# Patient Record
Sex: Male | Born: 1935 | Race: Black or African American | Hispanic: No | Smoking: Former smoker
Health system: Southern US, Community
[De-identification: ages and names within clinical notes are randomized; demographics above are authoritative.]

## PROBLEM LIST (undated history)

## (undated) DIAGNOSIS — Z8719 Personal history of other diseases of the digestive system: Secondary | ICD-10-CM

## (undated) DIAGNOSIS — H269 Unspecified cataract: Secondary | ICD-10-CM

## (undated) DIAGNOSIS — I10 Essential (primary) hypertension: Secondary | ICD-10-CM

## (undated) DIAGNOSIS — N184 Chronic kidney disease, stage 4 (severe): Secondary | ICD-10-CM

## (undated) DIAGNOSIS — I219 Acute myocardial infarction, unspecified: Secondary | ICD-10-CM

## (undated) DIAGNOSIS — M1990 Unspecified osteoarthritis, unspecified site: Secondary | ICD-10-CM

## (undated) DIAGNOSIS — D519 Vitamin B12 deficiency anemia, unspecified: Secondary | ICD-10-CM

## (undated) DIAGNOSIS — I119 Hypertensive heart disease without heart failure: Secondary | ICD-10-CM

## (undated) DIAGNOSIS — I739 Peripheral vascular disease, unspecified: Secondary | ICD-10-CM

## (undated) DIAGNOSIS — E785 Hyperlipidemia, unspecified: Secondary | ICD-10-CM

## (undated) DIAGNOSIS — I255 Ischemic cardiomyopathy: Secondary | ICD-10-CM

## (undated) DIAGNOSIS — K2940 Chronic atrophic gastritis without bleeding: Secondary | ICD-10-CM

## (undated) DIAGNOSIS — I2510 Atherosclerotic heart disease of native coronary artery without angina pectoris: Secondary | ICD-10-CM

## (undated) DIAGNOSIS — E139 Other specified diabetes mellitus without complications: Secondary | ICD-10-CM

## (undated) DIAGNOSIS — I5022 Chronic systolic (congestive) heart failure: Secondary | ICD-10-CM

## (undated) DIAGNOSIS — K449 Diaphragmatic hernia without obstruction or gangrene: Secondary | ICD-10-CM

## (undated) DIAGNOSIS — E119 Type 2 diabetes mellitus without complications: Secondary | ICD-10-CM

## (undated) DIAGNOSIS — K2270 Barrett's esophagus without dysplasia: Secondary | ICD-10-CM

## (undated) DIAGNOSIS — K31819 Angiodysplasia of stomach and duodenum without bleeding: Secondary | ICD-10-CM

## (undated) HISTORY — DX: Essential (primary) hypertension: I10

## (undated) HISTORY — DX: Atherosclerotic heart disease of native coronary artery without angina pectoris: I25.10

## (undated) HISTORY — DX: Acute myocardial infarction, unspecified: I21.9

## (undated) HISTORY — DX: Barrett's esophagus without dysplasia: K22.70

## (undated) HISTORY — DX: Hyperlipidemia, unspecified: E78.5

## (undated) HISTORY — DX: Peripheral vascular disease, unspecified: I73.9

## (undated) HISTORY — PX: CATARACT EXTRACTION, BILATERAL: SHX1313

## (undated) HISTORY — DX: Diaphragmatic hernia without obstruction or gangrene: K44.9

## (undated) HISTORY — DX: Chronic atrophic gastritis without bleeding: K29.40

## (undated) HISTORY — DX: Angiodysplasia of stomach and duodenum without bleeding: K31.819

## (undated) HISTORY — PX: COLONOSCOPY: SHX174

## (undated) HISTORY — PX: CARDIAC CATHETERIZATION: SHX172

## (undated) HISTORY — DX: Unspecified cataract: H26.9

---

## 1996-02-29 HISTORY — PX: CORONARY ANGIOPLASTY: SHX604

## 1997-09-29 ENCOUNTER — Encounter: Admission: RE | Admit: 1997-09-29 | Discharge: 1997-12-28 | Payer: Self-pay | Admitting: Internal Medicine

## 2001-02-28 HISTORY — PX: CORONARY ARTERY BYPASS GRAFT: SHX141

## 2001-07-12 ENCOUNTER — Encounter: Payer: Self-pay | Admitting: Interventional Cardiology

## 2001-07-13 ENCOUNTER — Inpatient Hospital Stay (HOSPITAL_COMMUNITY): Admission: RE | Admit: 2001-07-13 | Discharge: 2001-07-20 | Payer: Self-pay | Admitting: Interventional Cardiology

## 2001-07-16 ENCOUNTER — Encounter: Payer: Self-pay | Admitting: Surgery

## 2001-07-17 ENCOUNTER — Encounter: Payer: Self-pay | Admitting: Surgery

## 2001-07-18 ENCOUNTER — Encounter: Payer: Self-pay | Admitting: Surgery

## 2003-10-20 ENCOUNTER — Emergency Department (HOSPITAL_COMMUNITY): Admission: EM | Admit: 2003-10-20 | Discharge: 2003-10-20 | Payer: Self-pay | Admitting: Emergency Medicine

## 2004-03-12 ENCOUNTER — Ambulatory Visit: Payer: Self-pay | Admitting: Internal Medicine

## 2004-03-26 ENCOUNTER — Ambulatory Visit: Payer: Self-pay | Admitting: Internal Medicine

## 2004-06-10 ENCOUNTER — Ambulatory Visit: Payer: Self-pay | Admitting: Internal Medicine

## 2004-09-17 ENCOUNTER — Ambulatory Visit: Payer: Self-pay | Admitting: Internal Medicine

## 2004-12-17 ENCOUNTER — Ambulatory Visit: Payer: Self-pay | Admitting: Internal Medicine

## 2005-03-18 ENCOUNTER — Ambulatory Visit: Payer: Self-pay | Admitting: Internal Medicine

## 2005-06-10 ENCOUNTER — Ambulatory Visit: Payer: Self-pay | Admitting: Internal Medicine

## 2005-09-19 ENCOUNTER — Ambulatory Visit: Payer: Self-pay | Admitting: Internal Medicine

## 2005-12-06 ENCOUNTER — Ambulatory Visit: Payer: Self-pay | Admitting: Internal Medicine

## 2005-12-30 ENCOUNTER — Ambulatory Visit: Payer: Self-pay | Admitting: Internal Medicine

## 2005-12-30 LAB — CONVERTED CEMR LAB
ALT: 23 units/L (ref 0–40)
AST: 22 units/L (ref 0–37)
Albumin: 4.4 g/dL (ref 3.5–5.2)
Alkaline Phosphatase: 31 units/L — ABNORMAL LOW (ref 39–117)
BUN: 23 mg/dL (ref 6–23)
Bilirubin, Direct: 0.1 mg/dL (ref 0.0–0.3)
CO2: 30 meq/L (ref 19–32)
Calcium: 10.2 mg/dL (ref 8.4–10.5)
Chloride: 105 meq/L (ref 96–112)
Cholesterol: 162 mg/dL (ref 0–200)
Creatinine, Ser: 1.9 mg/dL — ABNORMAL HIGH (ref 0.4–1.5)
GFR calc non Af Amer: 37 mL/min
Glomerular Filtration Rate, Af Am: 45 mL/min/{1.73_m2}
Glucose, Bld: 88 mg/dL (ref 70–99)
Hgb A1c MFr Bld: 7.2 % — ABNORMAL HIGH (ref 4.6–6.0)
PSA: 0.75 ng/mL (ref 0.10–4.00)
Potassium: 3.8 meq/L (ref 3.5–5.1)
Sodium: 141 meq/L (ref 135–145)
Total Bilirubin: 0.5 mg/dL (ref 0.3–1.2)
Total Protein: 7.6 g/dL (ref 6.0–8.3)

## 2006-01-13 ENCOUNTER — Ambulatory Visit: Payer: Self-pay | Admitting: Internal Medicine

## 2006-04-14 ENCOUNTER — Ambulatory Visit: Payer: Self-pay | Admitting: Internal Medicine

## 2006-07-21 ENCOUNTER — Ambulatory Visit: Payer: Self-pay | Admitting: Internal Medicine

## 2006-07-21 LAB — CONVERTED CEMR LAB: Hgb A1c MFr Bld: 7.4 % — ABNORMAL HIGH (ref 4.6–6.0)

## 2006-10-02 DIAGNOSIS — I10 Essential (primary) hypertension: Secondary | ICD-10-CM

## 2006-10-02 DIAGNOSIS — N184 Chronic kidney disease, stage 4 (severe): Secondary | ICD-10-CM

## 2006-10-02 DIAGNOSIS — I252 Old myocardial infarction: Secondary | ICD-10-CM

## 2006-10-02 DIAGNOSIS — N259 Disorder resulting from impaired renal tubular function, unspecified: Secondary | ICD-10-CM

## 2006-10-02 DIAGNOSIS — I25709 Atherosclerosis of coronary artery bypass graft(s), unspecified, with unspecified angina pectoris: Secondary | ICD-10-CM | POA: Insufficient documentation

## 2006-10-02 DIAGNOSIS — E1122 Type 2 diabetes mellitus with diabetic chronic kidney disease: Secondary | ICD-10-CM

## 2006-10-02 DIAGNOSIS — E785 Hyperlipidemia, unspecified: Secondary | ICD-10-CM

## 2006-10-02 DIAGNOSIS — N400 Enlarged prostate without lower urinary tract symptoms: Secondary | ICD-10-CM | POA: Insufficient documentation

## 2006-10-20 ENCOUNTER — Ambulatory Visit: Payer: Self-pay | Admitting: Internal Medicine

## 2006-10-24 ENCOUNTER — Encounter: Payer: Self-pay | Admitting: Internal Medicine

## 2007-01-12 ENCOUNTER — Ambulatory Visit: Payer: Self-pay | Admitting: Internal Medicine

## 2007-01-12 LAB — CONVERTED CEMR LAB
ALT: 24 units/L (ref 0–53)
AST: 25 units/L (ref 0–37)
Albumin: 4.4 g/dL (ref 3.5–5.2)
Alkaline Phosphatase: 44 units/L (ref 39–117)
BUN: 26 mg/dL — ABNORMAL HIGH (ref 6–23)
Basophils Absolute: 0 10*3/uL (ref 0.0–0.1)
Basophils Relative: 0.4 % (ref 0.0–1.0)
Bilirubin Urine: NEGATIVE
Bilirubin, Direct: 0.1 mg/dL (ref 0.0–0.3)
CO2: 30 meq/L (ref 19–32)
Calcium: 10.1 mg/dL (ref 8.4–10.5)
Chloride: 104 meq/L (ref 96–112)
Cholesterol: 152 mg/dL (ref 0–200)
Creatinine, Ser: 2.1 mg/dL — ABNORMAL HIGH (ref 0.4–1.5)
Creatinine,U: 152.8 mg/dL
Eosinophils Absolute: 0.6 10*3/uL (ref 0.0–0.6)
Eosinophils Relative: 8.9 % — ABNORMAL HIGH (ref 0.0–5.0)
GFR calc Af Amer: 40 mL/min
GFR calc non Af Amer: 33 mL/min
Glucose, Bld: 148 mg/dL — ABNORMAL HIGH (ref 70–99)
Glucose, Urine, Semiquant: NEGATIVE
HCT: 39.8 % (ref 39.0–52.0)
HDL: 21.1 mg/dL — ABNORMAL LOW (ref >39.0)
Hemoglobin: 13.6 g/dL (ref 13.0–17.0)
Hgb A1c MFr Bld: 7.6 % — ABNORMAL HIGH (ref 4.6–6.0)
Ketones, urine, test strip: NEGATIVE
LDL Cholesterol: 100 mg/dL — ABNORMAL HIGH (ref 0–99)
Lymphocytes Relative: 19.5 % (ref 12.0–46.0)
MCHC: 34.3 g/dL (ref 30.0–36.0)
MCV: 104.8 fL — ABNORMAL HIGH (ref 78.0–100.0)
Microalb Creat Ratio: 68.7 mg/g — ABNORMAL HIGH (ref 0.0–30.0)
Microalb, Ur: 10.5 mg/dL — ABNORMAL HIGH (ref 0.0–1.9)
Monocytes Absolute: 0.3 10*3/uL (ref 0.2–0.7)
Monocytes Relative: 4.5 % (ref 3.0–11.0)
Neutro Abs: 4.2 10*3/uL (ref 1.4–7.7)
Neutrophils Relative %: 66.7 % (ref 43.0–77.0)
Nitrite: NEGATIVE
PSA: 0.93 ng/mL (ref 0.10–4.00)
Platelets: 348 10*3/uL (ref 150–400)
Potassium: 4.2 meq/L (ref 3.5–5.1)
RBC: 3.8 M/uL — ABNORMAL LOW (ref 4.22–5.81)
RDW: 14.5 % (ref 11.5–14.6)
Sodium: 141 meq/L (ref 135–145)
Specific Gravity, Urine: 1.025
TSH: 1.68 microintl units/mL (ref 0.35–5.50)
Total Bilirubin: 0.6 mg/dL (ref 0.3–1.2)
Total CHOL/HDL Ratio: 7.2
Total Protein: 7.2 g/dL (ref 6.0–8.3)
Triglycerides: 156 mg/dL — ABNORMAL HIGH (ref 0–149)
Urobilinogen, UA: 0.2
VLDL: 31 mg/dL (ref 0–40)
WBC Urine, dipstick: NEGATIVE
WBC: 6.3 10*3/uL (ref 4.5–10.5)
pH: 5.5

## 2007-01-19 ENCOUNTER — Ambulatory Visit: Payer: Self-pay | Admitting: Internal Medicine

## 2007-01-19 DIAGNOSIS — K279 Peptic ulcer, site unspecified, unspecified as acute or chronic, without hemorrhage or perforation: Secondary | ICD-10-CM

## 2007-01-19 DIAGNOSIS — I739 Peripheral vascular disease, unspecified: Secondary | ICD-10-CM

## 2007-05-04 ENCOUNTER — Encounter: Payer: Self-pay | Admitting: Internal Medicine

## 2007-06-01 ENCOUNTER — Ambulatory Visit: Payer: Self-pay | Admitting: Internal Medicine

## 2007-06-04 ENCOUNTER — Telehealth: Payer: Self-pay | Admitting: Internal Medicine

## 2007-06-04 LAB — CONVERTED CEMR LAB
BUN: 32 mg/dL — ABNORMAL HIGH (ref 6–23)
CO2: 32 meq/L (ref 19–32)
Calcium: 10.2 mg/dL (ref 8.4–10.5)
Chloride: 106 meq/L (ref 96–112)
Creatinine, Ser: 2.4 mg/dL — ABNORMAL HIGH (ref 0.4–1.5)
GFR calc Af Amer: 34 mL/min
GFR calc non Af Amer: 28 mL/min
Glucose, Bld: 98 mg/dL (ref 70–99)
Hgb A1c MFr Bld: 7.3 % — ABNORMAL HIGH (ref 4.6–6.0)
Potassium: 4.1 meq/L (ref 3.5–5.1)
Sodium: 143 meq/L (ref 135–145)

## 2007-06-11 ENCOUNTER — Telehealth: Payer: Self-pay | Admitting: Internal Medicine

## 2007-08-21 ENCOUNTER — Encounter: Payer: Self-pay | Admitting: Internal Medicine

## 2007-09-07 ENCOUNTER — Ambulatory Visit: Payer: Self-pay | Admitting: Internal Medicine

## 2007-09-07 LAB — CONVERTED CEMR LAB: Hgb A1c MFr Bld: 7.4 % — ABNORMAL HIGH (ref 4.6–6.0)

## 2007-09-25 ENCOUNTER — Ambulatory Visit: Payer: Self-pay

## 2007-09-25 ENCOUNTER — Encounter: Payer: Self-pay | Admitting: Internal Medicine

## 2007-09-27 ENCOUNTER — Encounter: Payer: Self-pay | Admitting: Internal Medicine

## 2007-10-18 ENCOUNTER — Encounter: Payer: Self-pay | Admitting: Internal Medicine

## 2007-10-25 ENCOUNTER — Encounter: Payer: Self-pay | Admitting: Internal Medicine

## 2007-11-02 ENCOUNTER — Encounter: Payer: Self-pay | Admitting: Internal Medicine

## 2007-11-16 ENCOUNTER — Telehealth (INDEPENDENT_AMBULATORY_CARE_PROVIDER_SITE_OTHER): Payer: Self-pay | Admitting: *Deleted

## 2007-11-19 ENCOUNTER — Encounter: Payer: Self-pay | Admitting: Internal Medicine

## 2007-11-29 ENCOUNTER — Encounter: Payer: Self-pay | Admitting: Internal Medicine

## 2007-12-07 ENCOUNTER — Ambulatory Visit: Payer: Self-pay | Admitting: Internal Medicine

## 2007-12-10 LAB — CONVERTED CEMR LAB
Albumin: 4.5 g/dL (ref 3.5–5.2)
Alkaline Phosphatase: 39 units/L (ref 39–117)
BUN: 26 mg/dL — ABNORMAL HIGH (ref 6–23)
Calcium: 10.1 mg/dL (ref 8.4–10.5)
Eosinophils Absolute: 0.4 10*3/uL (ref 0.0–0.7)
Eosinophils Relative: 8.3 % — ABNORMAL HIGH (ref 0.0–5.0)
GFR calc Af Amer: 38 mL/min
GFR calc non Af Amer: 31 mL/min
Glucose, Bld: 108 mg/dL — ABNORMAL HIGH (ref 70–99)
HCT: 39 % (ref 39.0–52.0)
Hemoglobin: 13.3 g/dL (ref 13.0–17.0)
MCV: 104.6 fL — ABNORMAL HIGH (ref 78.0–100.0)
Monocytes Absolute: 0.3 10*3/uL (ref 0.1–1.0)
Monocytes Relative: 6.5 % (ref 3.0–12.0)
Neutro Abs: 3.1 10*3/uL (ref 1.4–7.7)
Platelets: 300 10*3/uL (ref 150–400)
Potassium: 4.5 meq/L (ref 3.5–5.1)
RDW: 13.9 % (ref 11.5–14.6)
Sodium: 142 meq/L (ref 135–145)
Total Protein: 7.2 g/dL (ref 6.0–8.3)

## 2008-03-17 ENCOUNTER — Ambulatory Visit: Payer: Self-pay | Admitting: Internal Medicine

## 2008-03-18 ENCOUNTER — Telehealth (INDEPENDENT_AMBULATORY_CARE_PROVIDER_SITE_OTHER): Payer: Self-pay

## 2008-03-19 LAB — CONVERTED CEMR LAB
Calcium: 10.2 mg/dL (ref 8.4–10.5)
Chloride: 105 meq/L (ref 96–112)
GFR calc Af Amer: 38 mL/min
GFR calc non Af Amer: 31 mL/min
Hgb A1c MFr Bld: 7.6 % — ABNORMAL HIGH (ref 4.6–6.0)
Phosphorus: 3.8 mg/dL (ref 2.3–4.6)
Potassium: 4.9 meq/L (ref 3.5–5.1)
Sodium: 142 meq/L (ref 135–145)

## 2008-04-18 ENCOUNTER — Ambulatory Visit: Payer: Self-pay | Admitting: Gastroenterology

## 2008-05-02 ENCOUNTER — Ambulatory Visit: Payer: Self-pay | Admitting: Gastroenterology

## 2008-05-02 ENCOUNTER — Encounter: Payer: Self-pay | Admitting: Gastroenterology

## 2008-05-08 ENCOUNTER — Encounter: Payer: Self-pay | Admitting: Gastroenterology

## 2008-05-08 ENCOUNTER — Telehealth: Payer: Self-pay | Admitting: Gastroenterology

## 2008-05-09 ENCOUNTER — Encounter: Payer: Self-pay | Admitting: Internal Medicine

## 2008-05-19 ENCOUNTER — Ambulatory Visit: Payer: Self-pay | Admitting: Gastroenterology

## 2008-05-19 DIAGNOSIS — K5289 Other specified noninfective gastroenteritis and colitis: Secondary | ICD-10-CM | POA: Insufficient documentation

## 2008-05-19 DIAGNOSIS — D126 Benign neoplasm of colon, unspecified: Secondary | ICD-10-CM | POA: Insufficient documentation

## 2008-05-23 ENCOUNTER — Encounter: Payer: Self-pay | Admitting: Internal Medicine

## 2008-05-29 ENCOUNTER — Encounter: Payer: Self-pay | Admitting: Internal Medicine

## 2008-06-13 ENCOUNTER — Ambulatory Visit: Payer: Self-pay | Admitting: Internal Medicine

## 2008-06-13 LAB — CONVERTED CEMR LAB: Hgb A1c MFr Bld: 7.3 % — ABNORMAL HIGH (ref 4.6–6.5)

## 2008-09-19 ENCOUNTER — Ambulatory Visit: Payer: Self-pay | Admitting: Internal Medicine

## 2008-12-19 ENCOUNTER — Ambulatory Visit: Payer: Self-pay | Admitting: Internal Medicine

## 2008-12-23 LAB — CONVERTED CEMR LAB
BUN: 44 mg/dL — ABNORMAL HIGH (ref 6–23)
CO2: 28 meq/L (ref 19–32)
Creatinine, Ser: 3.3 mg/dL — ABNORMAL HIGH (ref 0.4–1.5)
GFR calc non Af Amer: 23.59 mL/min (ref >60)
Glucose, Bld: 82 mg/dL (ref 70–99)
Hgb A1c MFr Bld: 7.6 % — ABNORMAL HIGH (ref 4.6–6.5)

## 2009-02-02 ENCOUNTER — Telehealth: Payer: Self-pay | Admitting: Internal Medicine

## 2009-03-10 ENCOUNTER — Encounter: Payer: Self-pay | Admitting: Internal Medicine

## 2009-03-20 ENCOUNTER — Ambulatory Visit: Payer: Self-pay | Admitting: Internal Medicine

## 2009-05-29 ENCOUNTER — Ambulatory Visit: Payer: Self-pay | Admitting: Internal Medicine

## 2009-05-29 LAB — CONVERTED CEMR LAB
BUN: 51 mg/dL — ABNORMAL HIGH (ref 6–23)
Calcium: 9.7 mg/dL (ref 8.4–10.5)
Creatinine, Ser: 3.5 mg/dL — ABNORMAL HIGH (ref 0.4–1.5)
GFR calc non Af Amer: 22.01 mL/min (ref >60)
Glucose, Bld: 138 mg/dL — ABNORMAL HIGH (ref 70–99)
Potassium: 5 meq/L (ref 3.5–5.1)

## 2009-07-23 ENCOUNTER — Encounter: Payer: Self-pay | Admitting: Internal Medicine

## 2009-08-28 ENCOUNTER — Ambulatory Visit: Payer: Self-pay | Admitting: Internal Medicine

## 2009-09-08 ENCOUNTER — Encounter: Payer: Self-pay | Admitting: Internal Medicine

## 2009-09-15 ENCOUNTER — Encounter: Payer: Self-pay | Admitting: Internal Medicine

## 2009-11-27 ENCOUNTER — Ambulatory Visit: Payer: Self-pay | Admitting: Internal Medicine

## 2009-11-27 LAB — CONVERTED CEMR LAB: Blood Glucose, Fingerstick: 86

## 2009-12-18 ENCOUNTER — Encounter: Payer: Self-pay | Admitting: Internal Medicine

## 2010-02-19 ENCOUNTER — Ambulatory Visit: Payer: Self-pay | Admitting: Internal Medicine

## 2010-02-19 ENCOUNTER — Encounter: Payer: Self-pay | Admitting: Internal Medicine

## 2010-02-19 LAB — CONVERTED CEMR LAB: Hgb A1c MFr Bld: 7.1 % — ABNORMAL HIGH (ref <5.7)

## 2010-03-30 NOTE — Assessment & Plan Note (Signed)
Summary: 3 MONTH FOLLOW UP/[facility]   Vital Signs:  Patient profile:   77 year old male Weight:      175 pounds Temp:     98.1 degrees F oral BP sitting:   100 / 60  (right arm) Cuff size:   regular  Vitals Entered By: Cay Schillings LPN (July  1, 624THL 624THL PM) CC: rov-doing well        fbs110 checks in PM Is Patient Diabetic? Yes Did you bring your meter with you today? No CBG Result 80   CC:  rov-doing well        fbs110 checks in PM.  History of Present Illness: 77 year old patient has a history of type 2 diabetes.  He is current.  Artery disease with dyslipidemia, and hypertension.  He is followed closely by nephrology due to chronic kidney disease.  serum creatinines generally run from 3.0 to 3.5.  He is doing quite well.  Random blood sugars, as well as fasting blood sugars seem to reveal much glycemic control.  His last hemoglobin A1c7.5.  He takes glipizide 5 mg twice daily.  He denies any cardiopulmonary complaints.  His weight has been stable.  Allergies (verified): No Known Drug Allergies  Past History:  Past Medical History: Reviewed history from 05/19/2008 and no changes required. Coronary artery disease 1990 Diabetes mellitus, type II Hyperlipidemia Hypertension Myocardial infarction, hx of 99 Renal insufficiency Benign prostatic hypertrophy Peripheral vascular disease Peptic ulcer disease 1995 Hemorrhoids Diverticulosis Colitis  Past Surgical History: Reviewed history from 01/19/2007 and no changes required. Colonoscopy-12/27/2002 status post CABG 2003  Review of Systems  The patient denies anorexia, fever, weight loss, weight gain, vision loss, decreased hearing, hoarseness, chest pain, syncope, dyspnea on exertion, peripheral edema, prolonged cough, headaches, hemoptysis, abdominal pain, melena, hematochezia, severe indigestion/heartburn, hematuria, incontinence, genital sores, muscle weakness, suspicious skin lesions, transient blindness, difficulty  walking, depression, unusual weight change, abnormal bleeding, enlarged lymph nodes, angioedema, breast masses, and testicular masses.    Physical Exam  General:  Well-developed,well-nourished,in no acute distress; alert,appropriate and cooperative throughout examination Head:  Normocephalic and atraumatic without obvious abnormalities. No apparent alopecia or balding. Eyes:  No corneal or conjunctival inflammation noted. EOMI. Perrla. Funduscopic exam benign, without hemorrhages, exudates or papilledema. Vision grossly normal. Mouth:  Oral mucosa and oropharynx without lesions or exudates.  Teeth in good repair. Neck:  No deformities, masses, or tenderness noted. Lungs:  Normal respiratory effort, chest expands symmetrically. Lungs are clear to auscultation, no crackles or wheezes. Heart:  Normal rate and regular rhythm. S1 and S2 normal without gallop, murmur, click, rub or other extra sounds. Abdomen:  Bowel sounds positive,abdomen soft and non-tender without masses, organomegaly or hernias noted. Msk:  No deformity or scoliosis noted of thoracic or lumbar spine.   Pulses:  R and L carotid,radial,femoral,dorsalis pedis and posterior tibial pulses are full and equal bilaterally  Diabetes Management Exam:    Foot Exam (with socks and/or shoes not present):       Sensory-Pinprick/Light touch:          Left medial foot (L-4): diminished          Left dorsal foot (L-5): diminished          Left lateral foot (S-1): diminished          Right medial foot (L-4): diminished          Right dorsal foot (L-5): diminished          Right lateral foot (S-1): diminished  Sensory-Monofilament:          Left foot: diminished          Right foot: diminished       Inspection:          Left foot: normal          Right foot: normal       Nails:          Left foot: thickened          Right foot: thickened    Foot Exam by Podiatrist:       Date: 08/28/2009       Results: mild diabetic findings        Done by: PCP   Impression & Recommendations:  Problem # 1:  PERIPHERAL VASCULAR DISEASE (ICD-443.9)  Problem # 2:  RENAL INSUFFICIENCY (ICD-588.9)  Problem # 3:  HYPERLIPIDEMIA (P102836.4)  His updated medication list for this problem includes:    Simvastatin 40 Mg Tabs (Simvastatin) .Marland Kitchen... Take 1 tablet by mouth once a day    Tricor 145 Mg Tabs (Fenofibrate) .Marland Kitchen... Take 1 tablet by mouth once a day  His updated medication list for this problem includes:    Simvastatin 40 Mg Tabs (Simvastatin) .Marland Kitchen... Take 1 tablet by mouth once a day    Tricor 145 Mg Tabs (Fenofibrate) .Marland Kitchen... Take 1 tablet by mouth once a day  Problem # 4:  DIABETES MELLITUS, TYPE II (ICD-250.00)  His updated medication list for this problem includes:    Benazepril Hcl 40 Mg Tabs (Benazepril hcl) .Marland Kitchen... Take 1 tablet by mouth once a day    Aspirin 325 Mg Tabs (Aspirin) .Marland Kitchen... Take 1 tablet by mouth once a day    Glipizide 5 Mg Tb24 (Glipizide) .Marland Kitchen... 2  daily  Orders: Capillary Blood Glucose/CBG [id number])  His updated medication list for this problem includes:    Benazepril Hcl 40 Mg Tabs (Benazepril hcl) .Marland Kitchen... Take 1 tablet by mouth once a day    Aspirin 325 Mg Tabs (Aspirin) .Marland Kitchen... Take 1 tablet by mouth once a day    Glipizide 5 Mg Tb24 (Glipizide) .Marland Kitchen... 2  daily  Complete Medication List: 1)  Simvastatin 40 Mg Tabs (Simvastatin) .... Take 1 tablet by mouth once a day 2)  Tricor 145 Mg Tabs (Fenofibrate) .... Take 1 tablet by mouth once a day 3)  Benazepril Hcl 40 Mg Tabs (Benazepril hcl) .... Take 1 tablet by mouth once a day 4)  Pentoxifylline Cr 400 Mg Tbcr (Pentoxifylline) .... Take 1 tablet by mouth two times a day 5)  Clonidine Hcl 0.2 Mg Tabs (Clonidine hcl) .... Take 1 tablet by mouth once a day 6)  Felodipine 10 Mg Tb24 (Felodipine) .... Take 1 tablet by mouth once a day 7)  Furosemide 40 Mg Tabs (Furosemide) .... Take 1 tablet by mouth two times a day 8)  Aspirin 325 Mg Tabs (Aspirin) .... Take 1 tablet  by mouth once a day 9)  Levitra 20 Mg Tabs (Vardenafil hcl) .... As needed 10)  Cialis 20 Mg Tabs (Tadalafil) .... As needed 11)  Labetalol Hcl 300 Mg Tabs (Labetalol hcl) .Marland Kitchen.. 1 two times a day 12)  Glipizide 5 Mg Tb24 (Glipizide) .... 2  daily 13)  Multivitamins Tabs (Multiple vitamin) .... Take 1 tablet by mouth once a day 14)  Zolpidem Tartrate 5 Mg Tabs (Zolpidem tartrate) .... One at bedtime as needed for sleep  Other Orders: Venipuncture [id number]) TLB-A1C / Hgb A1C (Glycohemoglobin) (83036-A1C)  Patient Instructions:  1)  Please schedule a follow-up appointment in 3 months. 2)  Limit your Sodium (Salt). 3)  It is important that you exercise regularly at least 20 minutes 5 times a week. If you develop chest pain, have severe difficulty breathing, or feel very tired , stop exercising immediately and seek medical attention. 4)  Check your blood sugars regularly. If your readings are usually above : or below 70 you should contact our office. 5)  It is important that your Diabetic A1c level is checked every 3 months. 6)  See your eye doctor yearly to check for diabetic eye damage.

## 2010-03-30 NOTE — Assessment & Plan Note (Signed)
Summary: ROA X 3 MTHS / RS   Vital Signs:  Patient profile:   77 year old male Weight:      179 pounds BP sitting:   150 / 60  (left arm) Cuff size:   regular  Vitals Entered By: Chipper Oman, RN (March 20, 2009 12:57 PM) CC: 3 mo ROV Is Patient Diabetic? Yes   CC:  3 mo ROV.  History of Present Illness: 77 year old patient who is seen today for follow-up of his type 2 diabetes.  He states his home blood sugar monitoring has been under excellent control.  His last hemoglobin A1c, however, was 7.6.  Denies any hypoglycemic symptoms he has a history of coronary artery and peripheral vascular disease.  Denies any cardiopulmonary complaints.  Denies any claudication. He is treated hypertension, which has been stable on multiple drugs. He has chronic kidney disease and has recently been seen by nephrology.  Allergies: No Known Drug Allergies  Past History:  Past Medical History: Reviewed history from 05/19/2008 and no changes required. Coronary artery disease 1990 Diabetes mellitus, type II Hyperlipidemia Hypertension Myocardial infarction, hx of 99 Renal insufficiency Benign prostatic hypertrophy Peripheral vascular disease Peptic ulcer disease 1995 Hemorrhoids Diverticulosis Colitis  Review of Systems  The patient denies anorexia, fever, weight loss, weight gain, vision loss, decreased hearing, hoarseness, chest pain, syncope, dyspnea on exertion, peripheral edema, prolonged cough, headaches, hemoptysis, abdominal pain, melena, hematochezia, severe indigestion/heartburn, hematuria, incontinence, genital sores, muscle weakness, suspicious skin lesions, transient blindness, difficulty walking, depression, unusual weight change, abnormal bleeding, enlarged lymph nodes, angioedema, breast masses, and testicular masses.    Physical Exam  General:  Well-developed,well-nourished,in no acute distress; alert,appropriate and cooperative throughout examination; 130/80 Head:   Normocephalic and atraumatic without obvious abnormalities. No apparent alopecia or balding. Eyes:  arcus senilis Mouth:  Oral mucosa and oropharynx without lesions or exudates.  Teeth in good repair. Neck:  No deformities, masses, or tenderness noted. Lungs:  Normal respiratory effort, chest expands symmetrically. Lungs are clear to auscultation, no crackles or wheezes. Heart:  Normal rate and regular rhythm. S1 and S2 normal without gallop, murmur, click, rub or other extra sounds. Abdomen:  Bowel sounds positive,abdomen soft and non-tender without masses, organomegaly or hernias noted.   Impression & Recommendations:  Problem # 1:  RENAL INSUFFICIENCY (ICD-588.9)  Problem # 2:  HYPERTENSION (ICD-401.9)  His updated medication list for this problem includes:    Benazepril Hcl 40 Mg Tabs (Benazepril hcl) .Marland Kitchen... Take 1 tablet by mouth once a day    Clonidine Hcl 0.2 Mg Tabs (Clonidine hcl) .Marland Kitchen... Take 1 tablet by mouth once a day    Felodipine 10 Mg Tb24 (Felodipine) .Marland Kitchen... Take 1 tablet by mouth once a day    Furosemide 40 Mg Tabs (Furosemide) .Marland Kitchen... Take 1 tablet by mouth two times a day    Labetalol Hcl 300 Mg Tabs (Labetalol hcl) .Marland Kitchen... 1 two times a day  His updated medication list for this problem includes:    Benazepril Hcl 40 Mg Tabs (Benazepril hcl) .Marland Kitchen... Take 1 tablet by mouth once a day    Clonidine Hcl 0.2 Mg Tabs (Clonidine hcl) .Marland Kitchen... Take 1 tablet by mouth once a day    Felodipine 10 Mg Tb24 (Felodipine) .Marland Kitchen... Take 1 tablet by mouth once a day    Furosemide 40 Mg Tabs (Furosemide) .Marland Kitchen... Take 1 tablet by mouth two times a day    Labetalol Hcl 300 Mg Tabs (Labetalol hcl) .Marland Kitchen... 1 two times a day  Problem # 3:  DIABETES MELLITUS, TYPE II (ICD-250.00)  His updated medication list for this problem includes:    Benazepril Hcl 40 Mg Tabs (Benazepril hcl) .Marland Kitchen... Take 1 tablet by mouth once a day    Aspirin 325 Mg Tabs (Aspirin) .Marland Kitchen... Take 1 tablet by mouth once a day    Glipizide 5  Mg Tb24 (Glipizide) .Marland Kitchen... 2  daily    His updated medication list for this problem includes:    Benazepril Hcl 40 Mg Tabs (Benazepril hcl) .Marland Kitchen... Take 1 tablet by mouth once a day    Aspirin 325 Mg Tabs (Aspirin) .Marland Kitchen... Take 1 tablet by mouth once a day    Glipizide 5 Mg Tb24 (Glipizide) .Marland Kitchen... 2  daily  Orders: Venipuncture [id number]) TLB-A1C / Hgb A1C (Glycohemoglobin) (83036-A1C)  Complete Medication List: 1)  Simvastatin 40 Mg Tabs (Simvastatin) .... Take 1 tablet by mouth once a day 2)  Tricor 145 Mg Tabs (Fenofibrate) .... Take 1 tablet by mouth once a day 3)  Benazepril Hcl 40 Mg Tabs (Benazepril hcl) .... Take 1 tablet by mouth once a day 4)  Pentoxifylline Cr 400 Mg Tbcr (Pentoxifylline) .... Take 1 tablet by mouth two times a day 5)  Clonidine Hcl 0.2 Mg Tabs (Clonidine hcl) .... Take 1 tablet by mouth once a day 6)  Felodipine 10 Mg Tb24 (Felodipine) .... Take 1 tablet by mouth once a day 7)  Furosemide 40 Mg Tabs (Furosemide) .... Take 1 tablet by mouth two times a day 8)  Aspirin 325 Mg Tabs (Aspirin) .... Take 1 tablet by mouth once a day 9)  Levitra 20 Mg Tabs (Vardenafil hcl) .... As needed 10)  Cialis 20 Mg Tabs (Tadalafil) .... As needed 11)  Labetalol Hcl 300 Mg Tabs (Labetalol hcl) .Marland Kitchen.. 1 two times a day 12)  Glipizide 5 Mg Tb24 (Glipizide) .... 2  daily 13)  Multivitamins Tabs (Multiple vitamin) .... Take 1 tablet by mouth once a day 14)  Zolpidem Tartrate 5 Mg Tabs (Zolpidem tartrate) .... One at bedtime as needed for sleep  Patient Instructions: 1)  Please schedule a follow-up appointment in 3 months. 2)  Limit your Sodium (Salt). 3)  It is important that you exercise regularly at least 20 minutes 5 times a week. If you develop chest pain, have severe difficulty breathing, or feel very tired , stop exercising immediately and seek medical attention. 4)  Check your blood sugars regularly. If your readings are usually above : or below 70 you should contact our  office. 5)  It is important that your Diabetic A1c level is checked every 3 months. 6)  See your eye doctor yearly to check for diabetic eye damage.

## 2010-03-30 NOTE — Letter (Signed)
Summary: Eye Exam/[facility]   Eye Exam/[facility]   Imported By: Laural Benes 12/24/2009 14:25:05  _____________________________________________________________________  External Attachment:    Type:   Image     Comment:   External Document

## 2010-03-30 NOTE — Letter (Signed)
Summary: Eye Exam/[facility]  Eye Exam/[facility]   Imported By: Laural Benes 12/22/2009 15:26:14  _____________________________________________________________________  External Attachment:    Type:   Image     Comment:   External Document

## 2010-03-30 NOTE — Letter (Signed)
Summary: [facility]   Imported By: Laural Benes 09/24/2009 12:44:39  _____________________________________________________________________  External Attachment:    Type:   Image     Comment:   External Document

## 2010-03-30 NOTE — Assessment & Plan Note (Signed)
Summary: 3 month fup//ccm/pt rsc from bmp/cjr   Vital Signs:  Patient profile:   77 year old male Weight:      176 pounds Temp:     98.0 degrees F oral BP sitting:   124 / 70  (right arm) Cuff size:   regular  Vitals Entered By: Cay Schillings LPN (April  1, 624THL 075-GRM PM) CC: 3 mos rov/ doing ok     fbs 105 Is Patient Diabetic? Yes Did you bring your meter with you today? No   CC:  3 mos rov/ doing ok     fbs 105.  History of Present Illness: 77 year old patient who is seen today for follow-upthrough clinical grounds include type 2 diabetes.  Recently, he has maintained improved glycemic control.  Fasting blood sugar urgent 105.  His last hemoglobin A1c7.4.  He denies any hypoglycemic symptoms.  He has coronary artery disease, which has been stable.  Denies any shortness of breath or exertional chest pain.  He has treated hypertension and dyslipidemia.  He remains on simvastatin and fenofibrate.  He continues to tolerate his medications well.  He is on daily aspirin.  He is on multiple drugs for blood pressure control  Preventive Screening-Counseling & Management  Alcohol-Tobacco     Smoking Status: quit  Allergies (verified): No Known Drug Allergies  Past History:  Past Medical History: Reviewed history from 05/19/2008 and no changes required. Coronary artery disease 1990 Diabetes mellitus, type II Hyperlipidemia Hypertension Myocardial infarction, hx of 99 Renal insufficiency Benign prostatic hypertrophy Peripheral vascular disease Peptic ulcer disease 1995 Hemorrhoids Diverticulosis Colitis  Past Surgical History: Reviewed history from 01/19/2007 and no changes required. Colonoscopy-12/27/2002 status post CABG 2003  Review of Systems  The patient denies anorexia, fever, weight loss, weight gain, vision loss, decreased hearing, hoarseness, chest pain, syncope, dyspnea on exertion, peripheral edema, prolonged cough, headaches, hemoptysis, abdominal pain,  melena, hematochezia, severe indigestion/heartburn, hematuria, incontinence, genital sores, muscle weakness, suspicious skin lesions, transient blindness, difficulty walking, depression, unusual weight change, abnormal bleeding, enlarged lymph nodes, angioedema, breast masses, and testicular masses.    Physical Exam  General:  Well-developed,well-nourished,in no acute distress; alert,appropriate and cooperative throughout examination; 104/70 Head:  Normocephalic and atraumatic without obvious abnormalities. No apparent alopecia or balding. Eyes:  No corneal or conjunctival inflammation noted. EOMI. Perrla. Funduscopic exam benign, without hemorrhages, exudates or papilledema. Vision grossly normal. Mouth:  Oral mucosa and oropharynx without lesions or exudates.  Teeth in good repair. Neck:  No deformities, masses, or tenderness noted. Lungs:  Normal respiratory effort, chest expands symmetrically. Lungs are clear to auscultation, no crackles or wheezes. Heart:  Normal rate and regular rhythm. S1 and S2 normal without gallop, murmur, click, rub or other extra sounds. Abdomen:  Bowel sounds positive,abdomen soft and non-tender without masses, organomegaly or hernias noted. Msk:  No deformity or scoliosis noted of thoracic or lumbar spine.   Extremities:  No clubbing, cyanosis, edema, or deformity noted with normal full range of motion of all joints.   Skin:  Intact without suspicious lesions or rashes Cervical Nodes:  No lymphadenopathy noted Psych:  Cognition and judgment appear intact. Alert and cooperative with normal attention span and concentration. No apparent delusions, illusions, hallucinations  Diabetes Management Exam:    Eye Exam:       Eye Exam done here today          Results: normal   Impression & Recommendations:  Problem # 1:  PERIPHERAL VASCULAR DISEASE (ICD-443.9)  Problem # 2:  MYOCARDIAL INFARCTION, HX OF (ICD-412)  His updated medication list for this problem  includes:    Benazepril Hcl 40 Mg Tabs (Benazepril hcl) .Marland Kitchen... Take 1 tablet by mouth once a day    Clonidine Hcl 0.2 Mg Tabs (Clonidine hcl) .Marland Kitchen... Take 1 tablet by mouth once a day    Felodipine 10 Mg Tb24 (Felodipine) .Marland Kitchen... Take 1 tablet by mouth once a day    Furosemide 40 Mg Tabs (Furosemide) .Marland Kitchen... Take 1 tablet by mouth two times a day    Aspirin 325 Mg Tabs (Aspirin) .Marland Kitchen... Take 1 tablet by mouth once a day    Labetalol Hcl 300 Mg Tabs (Labetalol hcl) .Marland Kitchen... 1 two times a day    His updated medication list for this problem includes:    Benazepril Hcl 40 Mg Tabs (Benazepril hcl) .Marland Kitchen... Take 1 tablet by mouth once a day    Clonidine Hcl 0.2 Mg Tabs (Clonidine hcl) .Marland Kitchen... Take 1 tablet by mouth once a day    Felodipine 10 Mg Tb24 (Felodipine) .Marland Kitchen... Take 1 tablet by mouth once a day    Furosemide 40 Mg Tabs (Furosemide) .Marland Kitchen... Take 1 tablet by mouth two times a day    Aspirin 325 Mg Tabs (Aspirin) .Marland Kitchen... Take 1 tablet by mouth once a day    Labetalol Hcl 300 Mg Tabs (Labetalol hcl) .Marland Kitchen... 1 two times a day  Orders: Prescription Created Electronically [phone])  Problem # 3:  HYPERTENSION (ICD-401.9)  His updated medication list for this problem includes:    Benazepril Hcl 40 Mg Tabs (Benazepril hcl) .Marland Kitchen... Take 1 tablet by mouth once a day    Clonidine Hcl 0.2 Mg Tabs (Clonidine hcl) .Marland Kitchen... Take 1 tablet by mouth once a day    Felodipine 10 Mg Tb24 (Felodipine) .Marland Kitchen... Take 1 tablet by mouth once a day    Furosemide 40 Mg Tabs (Furosemide) .Marland Kitchen... Take 1 tablet by mouth two times a day    Labetalol Hcl 300 Mg Tabs (Labetalol hcl) .Marland Kitchen... 1 two times a day  His updated medication list for this problem includes:    Benazepril Hcl 40 Mg Tabs (Benazepril hcl) .Marland Kitchen... Take 1 tablet by mouth once a day    Clonidine Hcl 0.2 Mg Tabs (Clonidine hcl) .Marland Kitchen... Take 1 tablet by mouth once a day    Felodipine 10 Mg Tb24 (Felodipine) .Marland Kitchen... Take 1 tablet by mouth once a day    Furosemide 40 Mg Tabs (Furosemide) .Marland Kitchen...  Take 1 tablet by mouth two times a day    Labetalol Hcl 300 Mg Tabs (Labetalol hcl) .Marland Kitchen... 1 two times a day  Problem # 4:  HYPERLIPIDEMIA (ICD-272.4)  His updated medication list for this problem includes:    Simvastatin 40 Mg Tabs (Simvastatin) .Marland Kitchen... Take 1 tablet by mouth once a day    Tricor 145 Mg Tabs (Fenofibrate) .Marland Kitchen... Take 1 tablet by mouth once a day  His updated medication list for this problem includes:    Simvastatin 40 Mg Tabs (Simvastatin) .Marland Kitchen... Take 1 tablet by mouth once a day    Tricor 145 Mg Tabs (Fenofibrate) .Marland Kitchen... Take 1 tablet by mouth once a day  Problem # 5:  DIABETES MELLITUS, TYPE II (ICD-250.00)  His updated medication list for this problem includes:    Benazepril Hcl 40 Mg Tabs (Benazepril hcl) .Marland Kitchen... Take 1 tablet by mouth once a day    Aspirin 325 Mg Tabs (Aspirin) .Marland Kitchen... Take 1 tablet by mouth once a day    Glipizide  5 Mg Tb24 (Glipizide) .Marland Kitchen... 2  daily    His updated medication list for this problem includes:    Benazepril Hcl 40 Mg Tabs (Benazepril hcl) .Marland Kitchen... Take 1 tablet by mouth once a day    Aspirin 325 Mg Tabs (Aspirin) .Marland Kitchen... Take 1 tablet by mouth once a day    Glipizide 5 Mg Tb24 (Glipizide) .Marland Kitchen... 2  daily  Orders: Venipuncture [id number]) TLB-BMP (Basic Metabolic Panel-BMET) (99991111) TLB-A1C / Hgb A1C (Glycohemoglobin) (83036-A1C)  Complete Medication List: 1)  Simvastatin 40 Mg Tabs (Simvastatin) .... Take 1 tablet by mouth once a day 2)  Tricor 145 Mg Tabs (Fenofibrate) .... Take 1 tablet by mouth once a day 3)  Benazepril Hcl 40 Mg Tabs (Benazepril hcl) .... Take 1 tablet by mouth once a day 4)  Pentoxifylline Cr 400 Mg Tbcr (Pentoxifylline) .... Take 1 tablet by mouth two times a day 5)  Clonidine Hcl 0.2 Mg Tabs (Clonidine hcl) .... Take 1 tablet by mouth once a day 6)  Felodipine 10 Mg Tb24 (Felodipine) .... Take 1 tablet by mouth once a day 7)  Furosemide 40 Mg Tabs (Furosemide) .... Take 1 tablet by mouth two times a day 8)   Aspirin 325 Mg Tabs (Aspirin) .... Take 1 tablet by mouth once a day 9)  Levitra 20 Mg Tabs (Vardenafil hcl) .... As needed 10)  Cialis 20 Mg Tabs (Tadalafil) .... As needed 11)  Labetalol Hcl 300 Mg Tabs (Labetalol hcl) .Marland Kitchen.. 1 two times a day 12)  Glipizide 5 Mg Tb24 (Glipizide) .... 2  daily 13)  Multivitamins Tabs (Multiple vitamin) .... Take 1 tablet by mouth once a day 14)  Zolpidem Tartrate 5 Mg Tabs (Zolpidem tartrate) .... One at bedtime as needed for sleep  Patient Instructions: 1)  Please schedule a follow-up appointment in 3 months. 2)  Limit your Sodium (Salt). 3)  It is important that you exercise regularly at least 20 minutes 5 times a week. If you develop chest pain, have severe difficulty breathing, or feel very tired , stop exercising immediately and seek medical attention. 4)  Check your blood sugars regularly. If your readings are usually above : or below 70 you should contact our office. 5)  It is important that your Diabetic A1c level is checked every 3 months. 6)  See your eye doctor yearly to check for diabetic eye damage. Prescriptions: GLIPIZIDE 5 MG  TB24 (GLIPIZIDE) 2  daily  #90 x 6   Entered and Authorized by:   Marletta Lor  MD   Signed by:   Marletta Lor  MD on 05/29/2009   Method used:   Electronically to        [facility] [phone]* (retail)       [place], [place]  [address]       Ph: [phone]       Fax: [phone]   [id number] LABETALOL HCL 300 MG  TABS (LABETALOL HCL) 1 two times a day  #180 x 6   Entered and Authorized by:   Marletta Lor  MD   Signed by:   Marletta Lor  MD on 05/29/2009   Method used:   Electronically to        [facility] [phone]* (retail)       [place], [place]  [address]       Ph: [phone]       Fax: [phone]   RxID:   [id number] FUROSEMIDE 40 MG  TABS (FUROSEMIDE) Take 1  tablet by mouth two times a day  #90 Tablet x 6   Entered and Authorized by:   Marletta Lor  MD   Signed by:   Marletta Lor  MD on 05/29/2009   Method used:   Electronically to        [facility] [phone]* (retail)       [address]       [place], [place]  [address]       Ph: [phone]       Fax: [phone]   RxID:   [id number] FELODIPINE 10 MG  TB24 (FELODIPINE) Take 1 tablet by mouth once a day  #90 Tablet x 6   Entered and Authorized by:   Marletta Lor  MD   Signed by:   Marletta Lor  MD on 05/29/2009   Method used:   Electronically to        [facility] [phone]* (retail)       [address]       [place], [place]  [address]       Ph: [phone]       Fax: [phone]   RxIDTZ:[id number] CLONIDINE HCL 0.2 MG  TABS (CLONIDINE HCL) Take 1 tablet by mouth once a day  #90 Tablet x 6   Entered and Authorized by:   Marletta Lor  MD   Signed by:   Marletta Lor  MD on 05/29/2009   Method used:   Electronically to        [facility] [phone]* (retail)       [address]       [place], [place]  [address]       Ph: [phone]       Fax: [phone]   RxIDSL:[id number] PENTOXIFYLLINE CR 400 MG  TBCR (PENTOXIFYLLINE) Take 1 tablet by mouth two times a day  #180 x 6   Entered and Authorized by:   Marletta Lor  MD   Signed by:   Marletta Lor  MD on 05/29/2009   Method used:   Electronically to        [facility] [phone]* (retail)       [address]       [place], [place]  [address]       Ph: [phone]       Fax: [phone]   RxIDKY:[id number] BENAZEPRIL HCL 40 MG  TABS (BENAZEPRIL HCL) Take 1 tablet by mouth once a day  #90 Tablet x 6   Entered and Authorized by:   Marletta Lor  MD   Signed by:   Marletta Lor  MD on 05/29/2009   Method used:   Electronically to         [facility] [phone]* (retail)       [address]       [place], [place]  [address]       Ph: [phone]  Fax: [phone]   RxIDCS:[id number] TRICOR 145 MG  TABS (FENOFIBRATE) Take 1 tablet by mouth once a day  #90 x 6   Entered and Authorized by:   Marletta Lor  MD   Signed by:   Marletta Lor  MD on 05/29/2009   Method used:   Electronically to        [facility] [phone]* (retail)       [address]       [place], [place]  [address]       Ph: [phone]       Fax: [phone]   RxIDHP:[id number] SIMVASTATIN 40 MG  TABS (SIMVASTATIN) Take 1 tablet by mouth once a day  #90 Tablet x 6   Entered and Authorized by:   Marletta Lor  MD   Signed by:   Marletta Lor  MD on 05/29/2009   Method used:   Electronically to        [facility] [phone]* (retail)       [address]       [place], [place]  [address]       Ph: [phone]       Fax: [phone]   RxIDKL:[id number]  A in the [facility], and I will see him in the eye and a we will keep you is a with sepsis and and on the way out.  We will

## 2010-03-30 NOTE — Letter (Signed)
Summary: [facility]   Imported By: Laural Benes 04/07/2009 11:25:09  _____________________________________________________________________  External Attachment:    Type:   Image     Comment:   External Document

## 2010-03-30 NOTE — Assessment & Plan Note (Signed)
Summary: 3 month follow up/cjr   Vital Signs:  Patient profile:   77 year old male Weight:      174 pounds Temp:     97.7 degrees F oral BP sitting:   108 / 64  (right arm) Cuff size:   regular  Vitals Entered By: Cay Schillings LPN (September 30, 624THL 12:56 PM) CC: 3 mos rov - doing well Is Patient Diabetic? Yes Did you bring your meter with you today? No CBG Result 86 Flu Vaccine Consent Questions     Do you have a history of severe allergic reactions to this vaccine? no    Any prior history of allergic reactions to egg and/or gelatin? no    Do you have a sensitivity to the preservative Thimersol? no    Do you have a past history of Guillan-Barre Syndrome? no    Do you currently have an acute febrile illness? no    Have you ever had a severe reaction to latex? no    Vaccine information given and explained to patient? yes    Are you currently pregnant? no    Lot Number:[id number]   Exp Date:08/28/2010   Site Given  Left Deltoid IM   CC:  3 mos rov - doing well.  History of Present Illness: 77 year old patient who has a history of chronic kidney disease, type 2 diabetes, hypertension, dyslipidemia.  He is on multiple medications for blood pressure control.  He denies any dizziness, weakness, or any orthostatic symptoms.  Blood pressure today is in the low normal range. He has a history of type 2 diabetes, which has been controlled on glipizide.  A random blood sugar today 86.  He denies any hypoglycemic symptoms. He is asking about possible discontinuation of medications  Allergies (verified): No Known Drug Allergies  Past History:  Past Medical History: Reviewed history from 05/19/2008 and no changes required. Coronary artery disease 1990 Diabetes mellitus, type II Hyperlipidemia Hypertension Myocardial infarction, hx of 99 Renal insufficiency Benign prostatic hypertrophy Peripheral vascular disease Peptic ulcer disease  1995 Hemorrhoids Diverticulosis Colitis  Past Surgical History: Reviewed history from 01/19/2007 and no changes required. Colonoscopy-12/27/2002 status post CABG 2003  Review of Systems  The patient denies anorexia, fever, weight loss, weight gain, vision loss, decreased hearing, hoarseness, chest pain, syncope, dyspnea on exertion, peripheral edema, prolonged cough, headaches, hemoptysis, abdominal pain, melena, hematochezia, severe indigestion/heartburn, hematuria, incontinence, genital sores, muscle weakness, suspicious skin lesions, transient blindness, difficulty walking, depression, unusual weight change, abnormal bleeding, enlarged lymph nodes, angioedema, breast masses, and testicular masses.    Physical Exam  General:  Well-developed,well-nourished,in no acute distress; alert,appropriate and cooperative throughout examination; 100/60 Head:  Normocephalic and atraumatic without obvious abnormalities. No apparent alopecia or balding. Mouth:  Oral mucosa and oropharynx without lesions or exudates.  Teeth in good repair. Neck:  No deformities, masses, or tenderness noted. Lungs:  Normal respiratory effort, chest expands symmetrically. Lungs are clear to auscultation, no crackles or wheezes. Heart:  Normal rate and regular rhythm. S1 and S2 normal without gallop, murmur, click, rub or other extra sounds. Abdomen:  Bowel sounds positive,abdomen soft and non-tender without masses, organomegaly or hernias noted. Msk:  No deformity or scoliosis noted of thoracic or lumbar spine.   Extremities:  No clubbing, cyanosis, edema, or deformity noted with normal full range of motion of all joints.     Impression & Recommendations:  Problem # 1:  HYPERTENSION (ICD-401.9)  The following medications were removed from the medication list:  Clonidine Hcl 0.2 Mg Tabs (Clonidine hcl) .Marland Kitchen... Take 1 tablet by mouth once a day    Labetalol Hcl 300 Mg Tabs (Labetalol hcl) .Marland Kitchen... 1 two times a day His  updated medication list for this problem includes:    Benazepril Hcl 40 Mg Tabs (Benazepril hcl) .Marland Kitchen... Take 1 tablet by mouth once a day    Felodipine 10 Mg Tb24 (Felodipine) .Marland Kitchen... Take 1 tablet by mouth once a day    Furosemide 40 Mg Tabs (Furosemide) .Marland Kitchen... Take 1 tablet by mouth two times a day    Catapres 0.1 Mg Tabs (Clonidine hcl) ..... One tablet at bedtime    Labetalol Hcl 200 Mg Tabs (Labetalol hcl) ..... One twice daily  The following medications were removed from the medication list:    Clonidine Hcl 0.2 Mg Tabs (Clonidine hcl) .Marland Kitchen... Take 1 tablet by mouth once a day    Labetalol Hcl 300 Mg Tabs (Labetalol hcl) .Marland Kitchen... 1 two times a day His updated medication list for this problem includes:    Benazepril Hcl 40 Mg Tabs (Benazepril hcl) .Marland Kitchen... Take 1 tablet by mouth once a day    Felodipine 10 Mg Tb24 (Felodipine) .Marland Kitchen... Take 1 tablet by mouth once a day    Furosemide 40 Mg Tabs (Furosemide) .Marland Kitchen... Take 1 tablet by mouth two times a day    Catapres 0.1 Mg Tabs (Clonidine hcl) ..... One tablet at bedtime    Labetalol Hcl 200 Mg Tabs (Labetalol hcl) ..... One twice daily  Problem # 2:  HYPERLIPIDEMIA (ICD-272.4)  The following medications were removed from the medication list:    Tricor 145 Mg Tabs (Fenofibrate) .Marland Kitchen... Take 1 tablet by mouth once a day His updated medication list for this problem includes:    Simvastatin 40 Mg Tabs (Simvastatin) .Marland Kitchen... Take 1 tablet by mouth once a day  The following medications were removed from the medication list:    Tricor 145 Mg Tabs (Fenofibrate) .Marland Kitchen... Take 1 tablet by mouth once a day His updated medication list for this problem includes:    Simvastatin 40 Mg Tabs (Simvastatin) .Marland Kitchen... Take 1 tablet by mouth once a day  Problem # 3:  DIABETES MELLITUS, TYPE II (ICD-250.00)  His updated medication list for this problem includes:    Benazepril Hcl 40 Mg Tabs (Benazepril hcl) .Marland Kitchen... Take 1 tablet by mouth once a day    Aspirin 325 Mg Tabs (Aspirin)  .Marland Kitchen... Take 1 tablet by mouth once a day    Glipizide 5 Mg Tb24 (Glipizide) .Marland Kitchen... 2  daily  Orders: Capillary Blood Glucose/CBG [id number])  His updated medication list for this problem includes:    Benazepril Hcl 40 Mg Tabs (Benazepril hcl) .Marland Kitchen... Take 1 tablet by mouth once a day    Aspirin 325 Mg Tabs (Aspirin) .Marland Kitchen... Take 1 tablet by mouth once a day    Glipizide 5 Mg Tb24 (Glipizide) .Marland Kitchen... 2  daily  Complete Medication List: 1)  Simvastatin 40 Mg Tabs (Simvastatin) .... Take 1 tablet by mouth once a day 2)  Benazepril Hcl 40 Mg Tabs (Benazepril hcl) .... Take 1 tablet by mouth once a day 3)  Pentoxifylline Cr 400 Mg Tbcr (Pentoxifylline) .... Take 1 tablet by mouth two times a day 4)  Felodipine 10 Mg Tb24 (Felodipine) .... Take 1 tablet by mouth once a day 5)  Furosemide 40 Mg Tabs (Furosemide) .... Take 1 tablet by mouth two times a day 6)  Aspirin 325 Mg Tabs (Aspirin) .... Take 1 tablet  by mouth once a day 7)  Levitra 20 Mg Tabs (Vardenafil hcl) .... As needed 8)  Cialis 20 Mg Tabs (Tadalafil) .... As needed 9)  Glipizide 5 Mg Tb24 (Glipizide) .... 2  daily 10)  Multivitamins Tabs (Multiple vitamin) .... Take 1 tablet by mouth once a day 11)  Zolpidem Tartrate 5 Mg Tabs (Zolpidem tartrate) .... One at bedtime as needed for sleep 12)  Catapres 0.1 Mg Tabs (Clonidine hcl) .... One tablet at bedtime 13)  Labetalol Hcl 200 Mg Tabs (Labetalol hcl) .... One twice daily  Other Orders: Admin 1st Vaccine [id number]) Flu Vaccine 64yrs + QO:[id number]) Venipuncture [id number]) TLB-A1C / Hgb A1C (Glycohemoglobin) (83036-A1C) Specimen Handling (99000)  Patient Instructions: 1)  Please schedule a follow-up appointment in 3 months. 2)  Limit your Sodium (Salt). 3)  It is important that you exercise regularly at least 20 minutes 5 times a week. If you develop chest pain, have severe difficulty breathing, or feel very tired , stop exercising immediately and seek medical attention. 4)  Check your blood sugars  regularly. If your readings are usually above : or below 70 you should contact our office. 5)  It is important that your Diabetic A1c level is checked every 3 months. 6)  See your eye doctor yearly to check for diabetic eye damage. Prescriptions: LABETALOL HCL 200 MG TABS (LABETALOL HCL) one twice daily  #180 x 6   Entered and Authorized by:   Marletta Lor  MD   Signed by:   Marletta Lor  MD on 11/27/2009   Method used:   Electronically to        [facility] [phone]* (retail)       [place], [place]  [address]       Ph: [phone]       Fax: [phone]   [id number] CATAPRES 0.1 MG TABS (CLONIDINE HCL) one tablet at bedtime  #90 x 6   Entered and Authorized by:   Marletta Lor  MD   Signed by:   Marletta Lor  MD on 11/27/2009   Method used:   Electronically to        [facility] [phone]* (retail)       [address]       [place], [place]  [address]       Ph: [phone]       Fax: [phone]   [id number]

## 2010-04-01 NOTE — Assessment & Plan Note (Signed)
Summary: 3 month fup//ccm rsc bmp/njr   Vital Signs:  Patient profile:   77 year old male Weight:      177 pounds Temp:     98.4 degrees F oral BP sitting:   138 / 70  (right arm) Cuff size:   regular  Vitals Entered By: Cay Schillings LPN (December 23, 624THL 1:16 PM) CC: 3 MOS ROV - DOING WELL   bs 99-100 Is Patient Diabetic? Yes Did you bring your meter with you today? No   CC:  3 MOS ROV - DOING WELL   bs 99-100.  History of Present Illness: 44 a year old patient who is seen today for follow up of his type 2 diabetes.  He is maintained on his glycemic control.  His hemoglobin A1 C's have increased from 7.0 to 7.2.  Last visit.  He has chronic kidney disease and is followed by renal medicine.  He has coronary artery disease, status post CABG.  This has been stable.  He has treated hypertension, dyslipidemia, and careful vascular disease.  He denies any exertional chest pain or claudication.  Allergies (verified): No Known Drug Allergies  Past History:  Past Medical History: Reviewed history from 05/19/2008 and no changes required. Coronary artery disease 1990 Diabetes mellitus, type II Hyperlipidemia Hypertension Myocardial infarction, hx of 99 Renal insufficiency Benign prostatic hypertrophy Peripheral vascular disease Peptic ulcer disease 1995 Hemorrhoids Diverticulosis Colitis  Past Surgical History: Reviewed history from 01/19/2007 and no changes required. Colonoscopy-12/27/2002 status post CABG 2003  Review of Systems  The patient denies anorexia, fever, weight loss, weight gain, vision loss, decreased hearing, hoarseness, chest pain, syncope, dyspnea on exertion, peripheral edema, prolonged cough, headaches, hemoptysis, abdominal pain, melena, hematochezia, severe indigestion/heartburn, hematuria, incontinence, genital sores, muscle weakness, suspicious skin lesions, transient blindness, difficulty walking, depression, unusual weight change, abnormal  bleeding, enlarged lymph nodes, angioedema, breast masses, and testicular masses.    Physical Exam  General:  Well-developed,well-nourished,in no acute distress; alert,appropriate and cooperative throughout examination Head:  Normocephalic and atraumatic without obvious abnormalities. No apparent alopecia or balding. Eyes:  No corneal or conjunctival inflammation noted. EOMI. Perrla. Funduscopic exam benign, without hemorrhages, exudates or papilledema. Vision grossly normal. Mouth:  Oral mucosa and oropharynx without lesions or exudates.  Teeth in good repair. Neck:  bruits  noted Chest Wall:  status post sternotomy Lungs:  Normal respiratory effort, chest expands symmetrically. Lungs are clear to auscultation, no crackles or wheezes. Heart:  Normal rate and regular rhythm. S1 and S2 normal without gallop, murmur, click, rub or other extra sounds. Abdomen:  Bowel sounds positive,abdomen soft and non-tender without masses, organomegaly or hernias noted. Msk:  No deformity or scoliosis noted of thoracic or lumbar spine.   Extremities:  no significant edema   Impression & Recommendations:  Problem # 1:  PERIPHERAL VASCULAR DISEASE (ICD-443.9)  Problem # 2:  RENAL INSUFFICIENCY (ICD-588.9)  Problem # 3:  HYPERTENSION (ICD-401.9)  His updated medication list for this problem includes:    Benazepril Hcl 40 Mg Tabs (Benazepril hcl) .Marland Kitchen... Take 1 tablet by mouth once a day    Felodipine 10 Mg Tb24 (Felodipine) .Marland Kitchen... Take 1 tablet by mouth once a day    Furosemide 40 Mg Tabs (Furosemide) .Marland Kitchen... Take 1 tablet by mouth two times a day    Catapres 0.1 Mg Tabs (Clonidine hcl) ..... One tablet at bedtime    Labetalol Hcl 200 Mg Tabs (Labetalol hcl) ..... One twice daily  His updated medication list for this problem  includes:    Benazepril Hcl 40 Mg Tabs (Benazepril hcl) .Marland Kitchen... Take 1 tablet by mouth once a day    Felodipine 10 Mg Tb24 (Felodipine) .Marland Kitchen... Take 1 tablet by mouth once a day     Furosemide 40 Mg Tabs (Furosemide) .Marland Kitchen... Take 1 tablet by mouth two times a day    Catapres 0.1 Mg Tabs (Clonidine hcl) ..... One tablet at bedtime    Labetalol Hcl 200 Mg Tabs (Labetalol hcl) ..... One twice daily  Problem # 4:  DIABETES MELLITUS, TYPE II (ICD-250.00)  His updated medication list for this problem includes:    Benazepril Hcl 40 Mg Tabs (Benazepril hcl) .Marland Kitchen... Take 1 tablet by mouth once a day    Aspirin 325 Mg Tabs (Aspirin) .Marland Kitchen... Take 1 tablet by mouth once a day    Glipizide 5 Mg Tb24 (Glipizide) .Marland Kitchen... 2  daily    His updated medication list for this problem includes:    Benazepril Hcl 40 Mg Tabs (Benazepril hcl) .Marland Kitchen... Take 1 tablet by mouth once a day    Aspirin 325 Mg Tabs (Aspirin) .Marland Kitchen... Take 1 tablet by mouth once a day    Glipizide 5 Mg Tb24 (Glipizide) .Marland Kitchen... 2  daily  Orders: Venipuncture [id number]) TLB-A1C / Hgb A1C (Glycohemoglobin) (83036-A1C) Specimen Handling (99000)  Complete Medication List: 1)  Simvastatin 40 Mg Tabs (Simvastatin) .... Take 1 tablet by mouth once a day 2)  Benazepril Hcl 40 Mg Tabs (Benazepril hcl) .... Take 1 tablet by mouth once a day 3)  Pentoxifylline Cr 400 Mg Tbcr (Pentoxifylline) .... Take 1 tablet by mouth two times a day 4)  Felodipine 10 Mg Tb24 (Felodipine) .... Take 1 tablet by mouth once a day 5)  Furosemide 40 Mg Tabs (Furosemide) .... Take 1 tablet by mouth two times a day 6)  Aspirin 325 Mg Tabs (Aspirin) .... Take 1 tablet by mouth once a day 7)  Levitra 20 Mg Tabs (Vardenafil hcl) .... As needed 8)  Cialis 20 Mg Tabs (Tadalafil) .... As needed 9)  Glipizide 5 Mg Tb24 (Glipizide) .... 2  daily 10)  Multivitamins Tabs (Multiple vitamin) .... Take 1 tablet by mouth once a day 11)  Zolpidem Tartrate 5 Mg Tabs (Zolpidem tartrate) .... One at bedtime as needed for sleep 12)  Catapres 0.1 Mg Tabs (Clonidine hcl) .... One tablet at bedtime 13)  Labetalol Hcl 200 Mg Tabs (Labetalol hcl) .... One twice daily  Patient  Instructions: 1)  Please schedule a follow-up appointment in 3 months for CPX  2)  Advised not to eat any food or drink any liquids after 10 PM the night before your procedure. 3)  Limit your Sodium (Salt). 4)  It is important that you exercise regularly at least 20 minutes 5 times a week. If you develop chest pain, have severe difficulty breathing, or feel very tired , stop exercising immediately and seek medical attention. 5)  Check your blood sugars regularly. If your readings are usually above : or below 70 you should contact our office. 6)  It is important that your Diabetic A1c level is checked every 3 months. 7)  See your eye doctor yearly to check for diabetic eye damage.   Orders Added: 1)  Est. Patient Level IV [id number] 2)  Venipuncture [id number] 3)  TLB-A1C / Hgb A1C (Glycohemoglobin) [83036-A1C] 4)  Specimen Handling [99000]

## 2010-04-15 ENCOUNTER — Other Ambulatory Visit: Payer: Self-pay

## 2010-04-15 MED ORDER — ZOLPIDEM TARTRATE 5 MG PO TABS: 5.0000 mg | ORAL_TABLET | Freq: Every day | ORAL | Status: DC

## 2010-04-15 NOTE — Telephone Encounter (Signed)
Called to [employer]

## 2010-05-06 ENCOUNTER — Encounter: Payer: Self-pay | Admitting: Cardiovascular Disease

## 2010-05-21 ENCOUNTER — Encounter: Payer: Self-pay | Admitting: Internal Medicine

## 2010-06-04 ENCOUNTER — Encounter: Payer: Self-pay | Admitting: Internal Medicine

## 2010-06-10 ENCOUNTER — Encounter: Payer: Self-pay | Admitting: Internal Medicine

## 2010-06-10 LAB — GLUCOSE, CAPILLARY
Glucose-Capillary: 134 mg/dL — ABNORMAL HIGH (ref 70–99)
Glucose-Capillary: 153 mg/dL — ABNORMAL HIGH (ref 70–99)

## 2010-06-11 ENCOUNTER — Encounter: Payer: Self-pay | Admitting: Internal Medicine

## 2010-06-11 ENCOUNTER — Ambulatory Visit (INDEPENDENT_AMBULATORY_CARE_PROVIDER_SITE_OTHER): Payer: PRIVATE HEALTH INSURANCE | Admitting: Internal Medicine

## 2010-06-11 DIAGNOSIS — E785 Hyperlipidemia, unspecified: Secondary | ICD-10-CM

## 2010-06-11 DIAGNOSIS — N259 Disorder resulting from impaired renal tubular function, unspecified: Secondary | ICD-10-CM

## 2010-06-11 DIAGNOSIS — I2510 Atherosclerotic heart disease of native coronary artery without angina pectoris: Secondary | ICD-10-CM

## 2010-06-11 DIAGNOSIS — I10 Essential (primary) hypertension: Secondary | ICD-10-CM

## 2010-06-11 DIAGNOSIS — E119 Type 2 diabetes mellitus without complications: Secondary | ICD-10-CM

## 2010-06-11 DIAGNOSIS — Z0000 Encounter for general adult medical examination without abnormal findings: Secondary | ICD-10-CM

## 2010-06-11 LAB — LIPID PANEL
Cholesterol: 137 mg/dL (ref 0–200)
HDL: 31.6 mg/dL — ABNORMAL LOW (ref >39.00)
LDL Cholesterol: 67 mg/dL (ref 0–99)
Triglycerides: 193 mg/dL — ABNORMAL HIGH (ref 0.0–149.0)
VLDL: 38.6 mg/dL (ref 0.0–40.0)

## 2010-06-11 LAB — TSH: TSH: 1.72 u[IU]/mL (ref 0.35–5.50)

## 2010-06-11 LAB — BASIC METABOLIC PANEL
BUN: 40 mg/dL — ABNORMAL HIGH (ref 6–23)
Calcium: 9.8 mg/dL (ref 8.4–10.5)
GFR: 26.03 mL/min — ABNORMAL LOW (ref >60.00)
Glucose, Bld: 139 mg/dL — ABNORMAL HIGH (ref 70–99)
Potassium: 5.4 mEq/L — ABNORMAL HIGH (ref 3.5–5.1)
Sodium: 140 mEq/L (ref 135–145)

## 2010-06-11 LAB — CBC WITH DIFFERENTIAL/PLATELET
Basophils Absolute: 0 10*3/uL (ref 0.0–0.1)
Eosinophils Relative: 9 % — ABNORMAL HIGH (ref 0.0–5.0)
HCT: 34.5 % — ABNORMAL LOW (ref 39.0–52.0)
Lymphocytes Relative: 19.7 % (ref 12.0–46.0)
Monocytes Relative: 3.7 % (ref 3.0–12.0)
Platelets: 365 10*3/uL (ref 150.0–400.0)
RDW: 14.7 % — ABNORMAL HIGH (ref 11.5–14.6)
WBC: 5.3 10*3/uL (ref 4.5–10.5)

## 2010-06-11 LAB — GLUCOSE, POCT (MANUAL RESULT ENTRY): POC Glucose: 197

## 2010-06-11 MED ORDER — FELODIPINE ER 10 MG PO TB24: 10.0000 mg | ORAL_TABLET | Freq: Every day | ORAL | Status: DC

## 2010-06-11 MED ORDER — ZOLPIDEM TARTRATE 5 MG PO TABS: 5.0000 mg | ORAL_TABLET | Freq: Every day | ORAL | Status: DC

## 2010-06-11 MED ORDER — CLONIDINE HCL 0.1 MG PO TABS: 0.1000 mg | ORAL_TABLET | Freq: Every day | ORAL | Status: DC

## 2010-06-11 MED ORDER — FUROSEMIDE 40 MG PO TABS: 40.0000 mg | ORAL_TABLET | Freq: Two times a day (BID) | ORAL | Status: DC

## 2010-06-11 MED ORDER — BENAZEPRIL HCL 40 MG PO TABS: 40.0000 mg | ORAL_TABLET | Freq: Every day | ORAL | Status: DC

## 2010-06-11 MED ORDER — LABETALOL HCL 200 MG PO TABS: 200.0000 mg | ORAL_TABLET | Freq: Two times a day (BID) | ORAL | Status: DC

## 2010-06-11 MED ORDER — FELODIPINE ER 10 MG PO TB24: 10.0000 mg | ORAL_TABLET | Freq: Two times a day (BID) | ORAL | Status: DC

## 2010-06-11 MED ORDER — SIMVASTATIN 40 MG PO TABS: 40.0000 mg | ORAL_TABLET | Freq: Every day | ORAL | Status: DC

## 2010-06-11 MED ORDER — PENTOXIFYLLINE ER 400 MG PO TBCR: 400.0000 mg | EXTENDED_RELEASE_TABLET | Freq: Two times a day (BID) | ORAL | Status: DC

## 2010-06-11 MED ORDER — GLIPIZIDE 5 MG PO TABS: 5.0000 mg | ORAL_TABLET | Freq: Two times a day (BID) | ORAL | Status: DC

## 2010-06-11 NOTE — Progress Notes (Signed)
Subjective:    Patient ID: [name], male    DOB: 1931-08-30, 77 y.o.   MRN: [id number]  HPI  77 year old patient who was seen today for an annual health examination. He has a history of coronary artery disease status post CABG in 2003 he has been seen by [facility] cardiology in the past he is doing quite well and has no exertional chest pain. He has stable claudication. The right leg is affected somewhat worse than the left. He is followed by renal medicine for chronic kidney disease. He has type 2 diabetes which has been under reasonable control. His last hemoglobin A1c 7.1. He did have a colonoscopy in March of 2010.  Past Medical History  Diagnosis Date  . CAD (coronary artery disease)   . Diabetes mellitus   . Hyperlipidemia   . Hypertension   . Myocardial infarction   . Renal insufficiency   . BPH (benign prostatic hyperplasia)   . PVD (peripheral vascular disease)   . PUD (peptic ulcer disease)   . Hemorrhoids   . Diverticulosis   . Colitis    Past Surgical History  Procedure Date  . Coronary artery bypass graft 2003    reports that he has quit smoking. He has never used smokeless tobacco. He reports that he does not drink alcohol or use illicit drugs. family history includes Cancer in his mother; Coronary artery disease in an unspecified family member; Diabetes in an unspecified family member; Hypertension in his mother and unspecified family member; and Stroke in his mother. No Known Allergies   1. Risk factors, based on past  M,S,F history- patient has coronary artery disease status post CABG. Risk factors include hypertension dyslipidemia and type 2 diabetes 2.  Physical activities: No exercise limitations does have lower extremity claudication right leg greater than the left but does walk daily  3.  Depression/mood: No history depression or mood disorder 4.  Hearing: No deficits 5.  ADL's: Independent in all aspects of daily living 6.  Fall risk: Low  7.  Home  safety: No problems identified  8.  Height weight, and visual acuity; height and weight stable no change in visual acuity does have annual eye examinations  9.  Counseling: Heart healthy diet more regular exercise discussed low-salt diet discussed 10. Lab orders based on risk factors: Laboratory profile including lipid panel will be reviewed  11. Referral : We'll followup with renal medicine and ophthalmology  12. Care plan: Continue aggressive risk factor modification. We'll check a lipid profile today blood pressure is under excellent control 13. Cognitive assessment: Alert and oriented with normal affect no cognitive dysfunction    Review of Systems  Constitutional: Negative for fever, chills, activity change, appetite change and fatigue.  HENT: Negative for hearing loss, ear pain, congestion, rhinorrhea, sneezing, mouth sores, trouble swallowing, neck pain, neck stiffness, dental problem, voice change, sinus pressure and tinnitus.   Eyes: Negative for photophobia, pain, redness and visual disturbance.  Respiratory: Negative for apnea, cough, choking, chest tightness, shortness of breath and wheezing.   Cardiovascular: Negative for chest pain, palpitations and leg swelling.  Gastrointestinal: Negative for nausea, vomiting, abdominal pain, diarrhea, constipation, blood in stool, abdominal distention, anal bleeding and rectal pain.  Genitourinary: Negative for dysuria, urgency, frequency, hematuria, flank pain, decreased urine volume, discharge, penile swelling, scrotal swelling, difficulty urinating, genital sores and testicular pain.  Musculoskeletal: Negative for myalgias, back pain, joint swelling, arthralgias and gait problem.  Skin: Negative for color change, rash and wound.  Neurological: Negative for dizziness, tremors, seizures, syncope, facial asymmetry, speech difficulty, weakness, light-headedness, numbness and headaches.  Hematological: Negative for adenopathy. Does not  bruise/bleed easily.  Psychiatric/Behavioral: Negative for suicidal ideas, hallucinations, behavioral problems, confusion, sleep disturbance, self-injury, dysphoric mood, decreased concentration and agitation. The patient is not nervous/anxious.        Objective:   Physical Exam  Constitutional: He appears well-developed and well-nourished.       Blood pressure 140/60 in both arms  HENT:  Head: Normocephalic and atraumatic.  Right Ear: External ear normal.  Left Ear: External ear normal.  Nose: Nose normal.  Mouth/Throat: Oropharynx is clear and moist.  Eyes: Conjunctivae and EOM are normal. Pupils are equal, round, and reactive to light. No scleral icterus.  Neck: Normal range of motion. Neck supple. No JVD present. No thyromegaly present.  Cardiovascular: Normal rate, regular rhythm and normal heart sounds.  Exam reveals no gallop and no friction rub.   No murmur heard.      Pedal pulses are absent  Pulmonary/Chest: Effort normal and breath sounds normal. He exhibits no tenderness.       Sternotomy scar  Abdominal: Soft. Bowel sounds are normal. He exhibits no distension and no mass. There is no tenderness.       Bilateral femoral bruits  Genitourinary: Rectum normal and penis normal. No penile tenderness.       Prostate +2 enlarged  Musculoskeletal: Normal range of motion. He exhibits no edema and no tenderness.  Lymphadenopathy:    He has no cervical adenopathy.  Neurological: He is alert. He has normal reflexes. No cranial nerve deficit. Coordination normal.  Skin: Skin is warm and dry. No rash noted.  Psychiatric: He has a normal mood and affect. His behavior is normal.          Assessment & Plan:   Annual health examination Coronary artery disease Diabetes mellitus type 2. We'll check a hemoglobin A1c and continue his present regimen more exercise discussed encouraged Hypertension stable Chronic kidney disease. We'll check a metabolic panel Stable  claudication  continue aggressive risk factor modification including statin therapy

## 2010-06-11 NOTE — Patient Instructions (Signed)
Limit your sodium (Salt) intake    It is important that you exercise regularly, at least 20 minutes 3 to 4 times per week.  If you develop chest pain or shortness of breath seek  medical attention.   Please check your hemoglobin A1c every 3 months   

## 2010-06-21 ENCOUNTER — Other Ambulatory Visit: Payer: Self-pay | Admitting: Internal Medicine

## 2010-07-16 NOTE — Consult Note (Signed)
[place]. [facility]  Patient:    [name], [name] Visit Number: [id number] MRN: [id number]          Service Type: MED Location: 2000 2022 01 Attending Physician:  Valla Leaver Dictated by:   Darrold Span Florene Glen, M.D. Admit Date:  07/12/2001 Discharge Date: 07/20/2001   CC:         Illene Labrador, M.D.  Marletta Lor, M.D. [facility]   Consultation Report  DATE OF BIRTH:  Aug 12, 1931  HISTORY OF PRESENT ILLNESS:  I was asked by Dr. Tamala Julian to see this 77 year old male who has a history of atherosclerotic cardiovascular disease admitted after a routine exercise tolerance test was positive, suggestive of myocardial ischemia. Admission labs were positive for serum creatinine of 2.1 mg/dL. We did not have any baseline laboratory studies. He was scheduled for a heart catheterization, but this was delayed due to the renal insufficiency and nephrology consultation was requested. The patients blood sugar was also noted to be 155.  PAST MEDICAL HISTORY: 1. Hypertension of approximately greater than 30 years duration, which has    been somewhat poorly controlled per his history. 2. Coronary artery disease, status post myocardial infarction in 1988, status    post PTCA of one vessel at that time. 3. History of erectile dysfunction. 4. History of possible peptic ulcer disease in the past.  CURRENT MEDICATIONS:  Toprol XL, clonidine, Norvasc, Lotensin, hydrochlorothiazide (both Lotensin and hydrochlorothiazide are on hold), and Mucomyst as well as intravenous fluids.  SOCIAL HISTORY:  He is widowed, a retired Theatre manager at [employer], prior 20-year pack history of cigarette smoking, no alcohol consumption.  ALLERGIES:  No known allergies to medications.  FAMILY HISTORY:  Remarkable for heart disease. There is no family history of kidney disease.  REVIEW OF SYSTEMS:  No edema, no difficulty with urination.  PHYSICAL EXAMINATION:  GENERAL:   Pleasant, elderly African-American male.  HEENT:  Normocephalic, atraumatic.  NECK:  No bruits or masses.  LUNGS:  Clear to auscultation.  HEART:  Regular rate and rhythm.  ABDOMEN:  Soft, no CVA tenderness, no abdominal bruits or masses or enlarged organs.  PULSES:  Femoral pulses 2+ bilaterally with a bruit on the right femoral area.  EXTREMITIES:  No cyanosis, clubbing, or edema.  NEUROLOGIC:  No obvious focallities.  LABORATORY DATA:  Hemoglobin 13.6, platelet count 306,000, potassium 4.0, BUN 26, creatinine 2.1. Urinalysis has not been done.  DIAGNOSTIC DATA:  Ultrasound has not been done.  ASSESSMENT:  Probable chronic renal failure on the basis of hypertensive nephrosclerosis, less likely atherosclerotic renal vascular disease.  RECOMMENDATIONS: 1. Follow up ultrasound and urinalysis results. Try to obtain baseline    laboratory data. 2. Intravenous fluids overnight with Mucomyst as you are doing. 3. Hold ACE inhibitor and diuretics as you were doing. 4. Limit contrast.    a. No left ventriculogram.    b. No aortogram.    c. No renal arteriogram. 5. Optimize blood pressure to "target range" 135/85 or less. 6. Further workup pending labs and ultrasound, etc. Dictated by:   Darrold Span. Florene Glen, M.D. Attending Physician:  Valla Leaver DD:  07/12/01 TD:  07/14/01 Job: [id number]

## 2010-07-16 NOTE — Consult Note (Signed)
[place]. [facility]  Patient:    [name], [name] Visit Number: [id number] MRN: [id number]          Service Type: MED Location: 2300 2399 05 Attending Physician:  Valla Leaver Dictated by:   Gaye Pollack, M.D. Proc. Date: 07/13/01 Admit Date:  07/12/2001   CC:         Farrel Gordon, M.D.   Consultation Report  REFERRING PHYSICIAN:  Farrel Gordon, M.D.  REASON FOR CONSULTATION:  Left main and severe three vessel coronary artery disease.  HISTORY OF PRESENT ILLNESS:  The patient is a 77 year old African-American male with a history of coronary artery disease, status post myocardial infarction in 1990 with PTCA done at that time.  He has remained asymptomatic and denies any chest pain or shortness of breath.  He recently underwent a stress test in Dr. Bronson Ing office that was reportedly positive and he was referred for cardiology evaluation.  The patient denied having any symptoms during that test.  He underwent cardiac catheterization today.  This showed 80% calcified distal left main stenosis.  There is 90% ostial LAD and 95% mid LAD stenosis.  The circumflex had 60% first marginal and 70% second marginal stenosis.  The right coronary artery was occluded with filling of the distal vessel by collaterals from the left coronary artery.  The left ventricular ejection fraction was about 60% with inferior akinesis.  REVIEW OF SYSTEMS:  Constitutional:  He denies fever or chills.  He has had no recent weight changes.  Eyes:  Negative.  ENT:  Negative.  Endocrine:  He denies diabetes and hypothyroidism.  Cardiovascular:  As above.  He has had no chest pain or shortness of breath.  He denies PND and orthopnea.  He has  had no palpitations.  He denies peripheral edema.  Respiratory:  He has had no cough or sputum production.  GI:  He denies nausea or vomiting.  He has had no melena or bright red blood per rectum.  He  has a questionable history of peptic ulcer disease and underwent an EGD in the past.  GU:  He denies dysuria and hematuria.  Neurologic:  He has had no focal weakness or numbness.  He denies dizziness and syncope.  He has never had a TIA or a stroke. Musculoskeletal:  No arthralgias or myalgias.  He remains active, walking 30-45 minutes per day.  Psychiatric:  Negative.  Skin:  Negative.  ALLERGIES:  None.  PAST MEDICAL HISTORY:  Significant for: 1. Hypertension. 2. History of coronary disease, status post myocardial infarction in 1998 with    angioplasty at that time. 3. History of possible peptic ulcer disease or hiatal hernia.  SOCIAL HISTORY:  He is a remote smoker, but quit 35 years ago.  He had 15 siblings and a couple of sisters have had heart problems.  His wife died in 94.  He has two living children and his son is here with him today.  FAMILY HISTORY:  Positive for coronary disease in his sisters.  MEDICATIONS: 1. Toprol XL 100 mg q.d. 2. HCTZ 25 mg q.d. 3. Clonidine 0.2 mg q.d. 4. Lotrel 5/20 mg q.d. 5. Viagra p.r.n. 6. Aspirin q.d.  PHYSICAL EXAMINATION:  His blood pressure is 135/70, pulse 75 and regular, and respiratory rate 16 and unlabored.  GENERAL APPEARANCE:  He is a well-developed black male in no distress.  HEENT:  He is normocephalic and atraumatic.  The pupils  are equal and reactive to light and accommodation.  Extraocular muscles are intact.  His throat is clear.  NECK:  Normal carotid pulses.  There are no bruits.  There is no adenopathy or thyromegaly.  CARDIAC:  Regular rate and rhythm with normal S1 and S2.  There is no murmurs, rubs, or gallops.  LUNGS:  Clear.  ABDOMEN:  Active bowel sounds.  His abdomen is soft and nontender.  There are no palpable masses or organomegaly.  EXTREMITIES:  No peripheral edema.  Pedal pulses are palpable bilaterally.  SKIN:  Warm and dry.  NEUROLOGIC:  Alert and oriented x 3.  Motor and sensory exams  are grossly normal.  In summary, [name] has high-grade left main and severe three vessel coronary artery disease with a positive stress test, but is asymptomatic.  I agree that proceeding with coronary artery bypass graft surgery is the best treatment to prevent ischemia, infarction, and sudden death.  He has chronic renal insufficiency.  He had a creatinine of 2.1 on admission, which decreased to 1.6 with hydration.  He has been seen by nephrology.  If his creatinine remains stable, we will plan to do surgery on Monday, Jul 15, 2001.  If his creatinine rises, we may need to delay this until his creatinine returns to his baseline.  I discussed the operative procedure of coronary artery bypass surgery with the patient and his son, including alternatives, benefits, and risks, including bleeding, blood transfusion, infection, stroke, myocardial infarction, graft failure, renal failure, and death.  They understand and agree to proceed. Dictated by:   Gaye Pollack, M.D. Attending Physician:  Valla Leaver DD:  07/13/01 TD:  07/16/01 Job: [id number]

## 2010-07-16 NOTE — Cardiovascular Report (Signed)
[place]. [facility]  Patient:    [name], [name] Visit Number: [id number] MRN: [id number]          Service Type: CAT Location: [id number] Attending Physician:  Belva Crome. Iii Dictated by:   Illene Labrador, M.D. Proc. Date: 07/13/01 Admit Date:  07/12/2001   CC:         Marletta Lor, M.D. [facility]  CVTS   Cardiac Catheterization  INDICATIONS:  The patient has a history of coronary artery disease and is status post myocardial infarction in the late 1980s or early 1990s at which time he had angioplasty of the right coronary.  He has subsequently done well without cardiac symptoms.  A recent exercise treadmill test performed by Dr. Burnice Logan was abnormal for evidence of ischemia although there were no symptoms of angina.  PROCEDURE PERFORMED: 1. Left heart catheterization. 2. Selective coronary angiography. 3. Left ventriculography by hand injection.  DESCRIPTION OF PROCEDURE:  After informed consent, a #6 French sheath was inserted into the right femoral artery using the modified Seldinger technique. A #6 French A2 multipurpose catheter was used for hemodynamic recordings, left ventriculography, and selective left coronary angiography.  A #6 [place] #4 right Judkins catheter was used for right coronary angiography.  The patient tolerated the procedure without complications.  He received 1 mg of IV Versed.   RESULTS:   I. Hemodynamic data      A. Aortic pressure 141/74.      B. Left ventricular pressure 141/3 mmHg.  II. Left ventriculography:  The left ventricle is mildly dilated.  The      inferobasal and mid inferior wall is akinetic.  Overall ejection fraction      is low normal, estimated in the 45-55% range.  No MR is noted. III. Coronary angiography:  There is heavy calcification of the LAD, distal      left main, and circumflex arteries.  There is also heavy calcification      of the proximal RCA.      A. Left main  coronary:  The left main coronary artery contains a distal         80-90% stenosis.  This is somewhat hazy appearing.      B. Left anterior descending coronary:  The LAD contains an ostial 90%         stenosis and a mid 95% stenosis.  The LAD wraps around the left         ventricular apex.  A first diagonal branch contains 90% mid stenosis.         A second diagonal branch contains proximal luminal irregularities with         high-grade obstruction in the mid vessel.      C. Circumflex artery:  The circumflex coronary artery contains mid 50%         narrowing, 60% obstruction in the first obtuse marginal, and 90%         obstruction segmentally in the third obtuse marginal.  The left         coronary circumflex system provides collaterals to the distal right         coronary.      D. Right coronary:  The right coronary artery is totally occluded in the         mid vessel.  The mid and proximal vessel is severely and diffusely         diseased.  As mentioned above, the  distal right coronary artery is         supplied by left-to-right collaterals.  CONCLUSIONS: 1. Severe multivessel coronary artery disease including total occlusion of    the right coronary, high-grade distal left main, high-grade ostial and    mid left anterior descending artery, and moderate circumflex disease. 2. Left ventricular dysfunction with ejection fraction 45-55% with inferior    akinesis.  RECOMMENDATIONS:  Coronary artery bypass grafting. Dictated by:   Illene Labrador, M.D. Attending Physician:  Belva Crome. Iii DD:  07/13/01 TD:  07/15/01 Job: [id number]

## 2010-07-16 NOTE — Op Note (Signed)
[place]. [facility]  Patient:    [name], RAPA Visit Number: [id number] MRN: [id number]          Service Type: MED Location: 2300 2301 01 Attending Physician:  Valla Leaver Dictated by:   Gaye Pollack, M.D. Proc. Date: 07/16/01 Admit Date:  07/12/2001   CC:         Illene Labrador, M.D.  Cath lab   Operative Report  PREOPERATIVE DIAGNOSIS:  High grade left main and severe three-vessel coronary artery disease.  POSTOPERATIVE DIAGNOSIS:  High grade left main and severe three-vessel coronary artery disease.  OPERATION PERFORMED:  Median sternotomy, extracorporeal circulation, coronary artery bypass graft surgery x 5 using a  left internal mammary artery graft to the left anterior descending coronary artery, with a saphenous vein graft to the diagonal branch of the left anterior descending, a sequential saphenous vein graft to the first and second obtuse marginal  branches of the left circumflex coronary artery and a saphenous vein graft to the posterior descending branch of the right coronary artery.  SURGEON:  Gaye Pollack, M.D.  ASSISTANT:  Vernard Gambles, P.A.  ANESTHESIA:  General endotracheal.  INDICATIONS FOR PROCEDURE:  The patient is a 77 year old gentleman with a history of coronary artery disease status post inferior myocardial infarction in 1998 treated with angioplasty.  He has been asymptomatic but recently had a positive stress test.  Cardiac catheterization showed 80% distal left main stenosis.  There was 90% ostial and 95% mid-LAD stenoses.  There was 60% first marginal and 70% second marginal stenosis.  The right coronary artery was occluded with filling of the posterior descending branch by left to right collaterals.  Ejection fraction was 60%.  After review of the angiogram and examination of the patient, it was felt that coronary artery bypass graft surgery was the best treatment.  The patients admission  creatinine was 2.1 which improved to 1.6 with intravenous hydration.  I discussed the operative procedure with the patient including alternatives to surgery, benefits, and risks including bleeding, possible blood transfusion, infection, stroke, myocardial infarction, and death.  I also explained this to his son and they both understood and agreed to proceed with surgery.  The patients creatinine remained stable at 1.8 to preop.  DESCRIPTION OF PROCEDURE:  The patient was taken to the operating room and placed on the table in supine position.  After induction of general endotracheal anesthesia, a Foley catheter was placed in the bladder using sterile technique.  Then the chest, abdomen and both lower extremities were prepped and draped in the usual sterile manner.  The chest was entered through a median sternotomy incision and the pericardium opened in the midline. Examination of the heart showed good ventricular contractility.  The ascending aorta had no palpable plaques in it.  Then the left internal mammary artery was harvested from the chest wall as a pedicle graft.  This was a medium caliber vessel with excellent blood flow through it.  At the same time, a segment of greater saphenous vein was harvested from the left leg.  This vein was of medium size and good quality.  Then the patient was heparinized and when an adequate activated clotting time was achieved, the distal ascending aorta was cannulated using a 20 French aortic cannula for arterial inflow.  Venous outflow was achieved using a two-stage venous cannula through the right atrial appendage.  An antegrade cardioplegia and vent cannula was inserted into the aortic root.  The patient  was placed on cardiopulmonary bypass and the distal coronary arteries were identified.  The LAD was a large graftable vessel that was heavily diseased in its proximal portion.  The first diagonal was small nongraftable vessel that was diffusely  diseased.  The second diagonal branch was a medium sized graftable vessel.  The first marginal was a large graftable vessel that was diffusely diseased.  The second marginal was a medium sized but graftable.  The posterior descending coronary artery was a medium sized vessel but it was also diffusely diseased.  There was evidence of previous inferior infarction with scar present on the inferior wall.  Then the aorta was cross-clamped and 500 cc of cold blood antegrade cardioplegia was administered in the aortic root with quick arrest of the heart.  Systemic hypothermia to 20 degrees centigrade and topical hypothermia with iced saline was used.  A temperature probe was placed in the septum and an insulating pad in the pericardium.  The first distal anastomosis was performed to the first marginal branch.  The internal diameter was about 1.75 mm.  The conduit used was a segment of greater saphenous vein.  Anastomosis was performed in side-to-side manner using continuous 7-0 Prolene suture.  The flow was measured through the graft and was excellent.  The second distal anastomosis was performed to the second marginal branch. The internal diameter was 1.6 mm.  The conduit used was the same segment of greater saphenous vein.  The anastomosis was performed in a sequential end-to-side manner using continuous 7-0 Prolene suture.  The flow was measured through the graft and was excellent.  Then another dose of cardioplegia was given down the vein graft and in the aortic root.  The third distal anastomosis was performed to the posterior descending coronary artery.  The internal diameter of this vessel was 1.6 mm.  The conduit used was a second segment of greater saphenous vein.  The anastomosis was performed in a sequential side-to-side manner using continuous 7-0 Prolene suture.  The flow was measured through the graft and was excellent.  Then  another dose of cardioplegia was given down this  vein graft and into the aortic root.  The fourth distal anastomosis was performed to the second diagonal branch. The internal diameter of this vessel was 1.5 mm.  The conduit used was a third segment of greater saphenous vein.  The anastomosis was performed in an end-to-side manner using continuous 7-0 Prolene suture.  The flow was measured through the graft and was excellent.  The fifth distal anastomosis was performed to the midportion of the left anterior descending coronary artery.  The internal diameter was about 2 mm. The conduit used was the left internal mammary artery graft.  This was brought through an opening in the left pericardium anterior to the phrenic nerve.  It was anastomosed to the LAD in end-to-side manner using continuous 8-0 Prolene suture.  The pedicle was tacked to the epicardium with 6-0 Prolene sutures. The patient was rewarmed to 37 degrees and the clamp removed from the mammary pedicle.  There was rapid warming of the ventricular septum and return of spontaneous ventricular fibrillation.  The crossclamp was removed with a time of 59 minutes and the patient defibrillated into sinus rhythm.  A partial occlusion clamp was placed on the aortic root and the three proximal vein graft anastomoses were performed in an end-to-side manner using continuous Prolene suture.  The clamps were removed, the vein grafts deaired and the clamps removed from them.  The  proximal and distal anastomoses appeared hemostatic and the line of the grafts satisfactory.  Graft markers were placed around the proximal anastomoses.  Two temporary right ventricular and right atrial pacing wires were placed and brought out through the skin.  When the patient had rewarmed to 37 degrees centigrade, he was weaned from cardiopulmonary bypass on low dose renal dopamine.  Total bypass time was 104 minutes.  Cardiac function appeared excellent with cardiac output of 5L per minute.  Protamine was  given and the venous and aortic cannulas were removed without difficulty.  Hemostasis was achieved.  Three chest tubes were placed with a tube in the posterior pericardium and one in the left pleural space and one in the anterior mediastinum.  The pericardium was reapproximated over the heart.  The sternum was closed with #6 stainless steel wires.  The fascia was closed with continuous #1 Vicryl suture.  The subcutaneous tissues were closed using continuous 2-0 Vicryl and the skin with 3-0 Vicryl subcuticular closure.  The lower extremity vein harvest site was closed in layers in a similar manner.  The sponge, needle and instrument counts were correct according to the scrub nurse.  A dry sterile dressing was applied over the incisions and around the chest tubes which were hooked to Pleur-evac suction.  The patient remained hemodynamically stable and was transported to the SICU in guarded but stable condition. Dictated by:   Gaye Pollack, M.D. Attending Physician:  Valla Leaver DD:  07/16/01 TD:  07/16/01 Job: [id number]

## 2010-07-16 NOTE — Discharge Summary (Signed)
[place]. [facility]  Patient:    [name], [name] Visit Number: [id number] MRN: [id number]          Service Type: MED Location: 2000 2022 01 Attending Physician:  Valla Leaver Dictated by:   Sheliah Hatch, P.A. Admit Date:  07/12/2001 Discharge Date: 07/20/2001   CC:         Farrel Gordon, M.D.  Marletta Lor, M.D. [facility]   Discharge Summary  DATE OF BIRTH:  1931/05/29  ADMISSION DIAGNOSIS:  Recent positive stress test and high-grade left main and severe three-vessel coronary artery disease.  DISCHARGE DIAGNOSES: 1. Severe coronary artery disease as above.  Left ventricular ejection    fraction 60% with inferior akinesis. 2. Hypertension. 3. Previous history of myocardial infarction in 1998, with angioplasty. 4. Peptic ulcer disease. 5. Newly diagnosed adult-onset, non-insulin-dependent diabetes mellitus this    admission. 6. Anemia.  PROCEDURE:  Coronary artery bypass graft x5 on Jul 16, 2001, with the following grafts: Left internal mammary artery to the left anterior descending; sequential saphenous vein graft from obtuse marginal-1 to obtuse marginal-2; saphenous vein graft to posterior descending artery; saphenous vein graft to diagonal.  HISTORY OF PRESENT ILLNESS:  The patient is a 77 year old male with known coronary artery disease and previous MI in 1998, treated with angioplasty.  He had been asymptomatic, but has had a recent positive stress test.  Cardiac catheterization was done which showed severe disease.  EF was 60%.  He had an admission creatinine of 2.1, which improved to 1.6 with IV hydration.  Dr. Cyndia Bent was consulted for CABG.  The risks, benefits, details and alternatives were discussed and it was agreed to proceed.  HOSPITAL COURSE:  He underwent the procedure on Jul 16, 2001.  There were no complications.  He was taken to the recovery room in stable condition.  Postoperatively, he did well  with routine care.  He had a hemoglobin A1C of 7.9 and high CBGs.  He was diagnosed with adult-onset diabetes.  He had received diabetes education and was put on Amaryl.  Internal medicine also evaluated him in addition to cardiology.  He had mild postop anemia for which he was put on p.o. supplements.  He was transferred to unit 2000, where he continued with routine care, walking well and doing well overall.  On Jul 20, 2001, he was afebrile with vital signs stable.  He was in sinus rhythm.  He was less than his preoperative weight.  Hemoglobin was 8.3, hematocrit 24.1.  Physical exam was satisfactory and the wounds were healing well.  He was suitable for discharge and was discharged home in stable condition.  DISCHARGE MEDICATIONS: 1. Norvasc 5 mg one daily. 2. Clonidine 0.2 mg daily. 3. Niferex 150 one daily. 4. Amaryl 1 mg one daily. 5. Zocor 20 mg one daily. 6. Tri-Chlor 160 mg one daily. 7. Lopressor 25 mg every 12 hours. 8. Percocet one to two every four to six hours p.r.n. pain.  ACTIVITY:  He was told to avoid driving, heavy lifting or strenuous activity. He was told that he could shower.  WOUND CARE:  He was to clean his wounds gently daily with soap and water.  SPECIAL INSTRUCTIONS:  Call the office if he had any problems or if he developed a fever.  He was told to get a chest x-ray when he saw Dr. Tamala Julian and to bring it with him to see Dr. Cyndia Bent.  FOLLOWUP:  Follow up with  Dr. Burnice Logan to check on his diabetes on July 31, 2001.  He was told to see Dr. Tamala Julian on Monday, August 06, 2001.  He was to see Dr. Cyndia Bent three weeks after discharge.  Office to call with apopintment.  CONDITION ON DISCHARGE:  Stable.  DISPOSITION:  Discharged to home. Dictated by:   Sheliah Hatch, P.A. Attending Physician:  Valla Leaver DD:  07/31/01 TD:  08/02/01 Job: [id number]

## 2010-07-16 NOTE — Assessment & Plan Note (Signed)
[facility] OFFICE NOTE   [name], [name]                  MRN:          [id number]  DATE:12/30/2005                            DOB:          Mar 12, 1931    [age]-year-old black gentleman who is seen today for an annual  clinical exam.  He has a history of coronary artery disease, status post  CABG in 2003.  He has type 2 diabetes and multi-drug-resistant hypertension.  He has dyslipidemia.  Additionally, he has a history of GI bleeding,  hospitalized in 1995 secondary to peptic ulcer disease.  His medical regimen  reviewed.   REVIEW OF SYSTEMS:  The patient 6 weeks ago did have a negative treadmill  stress test, did have a hypertensive blood pressure response.   FAMILY HISTORY:  Reviewed, positive for hypertension, cerebrovascular  disease and diabetes.   PHYSICAL EXAMINATION:  Revealed a well-developed black male in no acute  distress.  Blood pressure was 200/80.  Fundi, ears, nose and throat revealed some mild arcus senilis, otherwise  unremarkable.  NECK:  No bruits.  CHEST:  Clear.  CARDIOVASCULAR:  Normal heart sounds, no murmur.  Well-healed sternotomy  scar.  ABDOMEN:  Soft and nontender, no organomegaly.  No bruits appreciated.  EXTERNAL GENITALIA: Normal.  RECTAL:  Prostate +2 to +3 enlarged and benign, stool heme-negative.  EXTREMITIES:  Revealed nonpalpable peripheral pulses.   IMPRESSION:  1. Coronary artery disease.  2. Hypertension, suboptimal control.  3. Dyslipidemia.  4. Benign prostatic hypertrophy.  5. Peripheral vascular occlusive disease.   DISPOSITION:  The patient's metoprolol will be discontinued.  He will be  placed on labetalol 300 mg b.i.d.  Return in 2 weeks for followup.    ______________________________  Marletta Lor, MD    PFK/Med[facility]  DD: 12/30/2005  DT: 12/31/2005  Job #: [id number]

## 2010-07-23 LAB — HM DIABETES EYE EXAM: HM Diabetic Eye Exam: DETECTED

## 2010-07-29 ENCOUNTER — Encounter: Payer: Self-pay | Admitting: Internal Medicine

## 2010-10-01 ENCOUNTER — Encounter: Payer: Self-pay | Admitting: Internal Medicine

## 2010-10-01 ENCOUNTER — Ambulatory Visit (INDEPENDENT_AMBULATORY_CARE_PROVIDER_SITE_OTHER): Payer: Medicare Other | Admitting: Internal Medicine

## 2010-10-01 DIAGNOSIS — E785 Hyperlipidemia, unspecified: Secondary | ICD-10-CM

## 2010-10-01 DIAGNOSIS — N259 Disorder resulting from impaired renal tubular function, unspecified: Secondary | ICD-10-CM

## 2010-10-01 DIAGNOSIS — I2510 Atherosclerotic heart disease of native coronary artery without angina pectoris: Secondary | ICD-10-CM

## 2010-10-01 DIAGNOSIS — I10 Essential (primary) hypertension: Secondary | ICD-10-CM

## 2010-10-01 DIAGNOSIS — I739 Peripheral vascular disease, unspecified: Secondary | ICD-10-CM

## 2010-10-01 DIAGNOSIS — E119 Type 2 diabetes mellitus without complications: Secondary | ICD-10-CM

## 2010-10-01 MED ORDER — FUROSEMIDE 40 MG PO TABS: 40.0000 mg | ORAL_TABLET | Freq: Two times a day (BID) | ORAL | Status: DC

## 2010-10-01 MED ORDER — FELODIPINE ER 10 MG PO TB24: 10.0000 mg | ORAL_TABLET | Freq: Every day | ORAL | Status: DC

## 2010-10-01 MED ORDER — LABETALOL HCL 200 MG PO TABS: 200.0000 mg | ORAL_TABLET | Freq: Two times a day (BID) | ORAL | Status: DC

## 2010-10-01 MED ORDER — PENTOXIFYLLINE ER 400 MG PO TBCR: 400.0000 mg | EXTENDED_RELEASE_TABLET | Freq: Two times a day (BID) | ORAL | Status: DC

## 2010-10-01 MED ORDER — GLIPIZIDE 5 MG PO TABS: 5.0000 mg | ORAL_TABLET | Freq: Two times a day (BID) | ORAL | Status: DC

## 2010-10-01 MED ORDER — CLONIDINE HCL 0.1 MG PO TABS: 0.1000 mg | ORAL_TABLET | Freq: Every day | ORAL | Status: DC

## 2010-10-01 MED ORDER — SIMVASTATIN 20 MG PO TABS: 20.0000 mg | ORAL_TABLET | Freq: Every day | ORAL | Status: DC

## 2010-10-01 MED ORDER — BENAZEPRIL HCL 40 MG PO TABS: 40.0000 mg | ORAL_TABLET | Freq: Every day | ORAL | Status: DC

## 2010-10-01 MED ORDER — GLIPIZIDE 5 MG PO TABS: 5.0000 mg | ORAL_TABLET | ORAL | Status: DC

## 2010-10-01 NOTE — Progress Notes (Signed)
  Subjective:    Patient ID: [name], male    DOB: [birth year], 77 y.o.   MRN: [id number]  HPI  77 year old patient who is seen today for his quarterly followup. He has a history of type 2 diabetes this has been controlled on glipizide 5 mg twice a day. He has significant chronic kidney disease with a creatinine 3.04 months ago. He is doing remarkably well he has coronary artery disease which has been stable he has treated hypertension as well as dyslipidemia medical regimen includes simvastatin 40 mg daily which he tolerates well. He denies any cardiopulmonary complaints   Review of Systems  Constitutional: Negative for fever, chills, appetite change and fatigue.  HENT: Negative for hearing loss, ear pain, congestion, sore throat, trouble swallowing, neck stiffness, dental problem, voice change and tinnitus.   Eyes: Negative for pain, discharge and visual disturbance.  Respiratory: Negative for cough, chest tightness, wheezing and stridor.   Cardiovascular: Negative for chest pain, palpitations and leg swelling.  Gastrointestinal: Negative for nausea, vomiting, abdominal pain, diarrhea, constipation, blood in stool and abdominal distention.  Genitourinary: Negative for urgency, hematuria, flank pain, discharge, difficulty urinating and genital sores.  Musculoskeletal: Negative for myalgias, back pain, joint swelling, arthralgias and gait problem.  Skin: Negative for rash.  Neurological: Negative for dizziness, syncope, speech difficulty, weakness, numbness and headaches.  Hematological: Negative for adenopathy. Does not bruise/bleed easily.  Psychiatric/Behavioral: Negative for behavioral problems and dysphoric mood. The patient is not nervous/anxious.        Objective:   Physical Exam  Constitutional: He is oriented to person, place, and time. He appears well-developed.       Blood pressure 120/60 in both arms  HENT:  Head: Normocephalic.  Right Ear: External ear normal.    Left Ear: External ear normal.  Eyes: Conjunctivae and EOM are normal.  Neck: Normal range of motion.  Cardiovascular: Normal rate, regular rhythm and normal heart sounds.        Grade 2/6 systolic murmur. Sternotomy scar  Pulmonary/Chest: Breath sounds normal.  Abdominal: Bowel sounds are normal.  Musculoskeletal: Normal range of motion. He exhibits no edema and no tenderness.  Neurological: He is alert and oriented to person, place, and time.  Psychiatric: He has a normal mood and affect. His behavior is normal.          Assessment & Plan:   Coronary artery disease stable Chronic kidney disease. Followup nephrology later this month Hypertension well controlled Diabetes. We'll decrease his glipizide to 5 mg daily. He states he often has blood sugars less than 100 we'll check a hemoglobin A1c  Medications refill We'll decrease simvastatin to 20 mg daily

## 2010-10-01 NOTE — Patient Instructions (Signed)
Limit your sodium (Salt) intake    It is important that you exercise regularly, at least 20 minutes 3 to 4 times per week.  If you develop chest pain or shortness of breath seek  medical attention.   Please check your hemoglobin A1c every 3 months  Renal followup as scheduled

## 2010-10-08 ENCOUNTER — Ambulatory Visit: Payer: PRIVATE HEALTH INSURANCE | Admitting: Internal Medicine

## 2010-10-18 ENCOUNTER — Ambulatory Visit (INDEPENDENT_AMBULATORY_CARE_PROVIDER_SITE_OTHER): Payer: PRIVATE HEALTH INSURANCE | Admitting: Family Medicine

## 2010-10-18 ENCOUNTER — Encounter: Payer: Self-pay | Admitting: Family Medicine

## 2010-10-18 VITALS — BP 124/70 | HR 85 | Temp 98.6°F | Wt 169.0 lb

## 2010-10-18 DIAGNOSIS — S40029A Contusion of unspecified upper arm, initial encounter: Secondary | ICD-10-CM
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
## 2011-01-07 ENCOUNTER — Ambulatory Visit (INDEPENDENT_AMBULATORY_CARE_PROVIDER_SITE_OTHER): Payer: PRIVATE HEALTH INSURANCE | Admitting: Internal Medicine

## 2011-01-07 ENCOUNTER — Encounter: Payer: Self-pay | Admitting: Internal Medicine

## 2011-01-07 DIAGNOSIS — N259 Disorder resulting from impaired renal tubular function, unspecified: Secondary | ICD-10-CM

## 2011-01-07 DIAGNOSIS — E119 Type 2 diabetes mellitus without complications: Secondary | ICD-10-CM

## 2011-01-07 DIAGNOSIS — I2510 Atherosclerotic heart disease of native coronary artery without angina pectoris: Secondary | ICD-10-CM

## 2011-01-07 DIAGNOSIS — Z0000 Encounter for general adult medical examination without abnormal findings: Secondary | ICD-10-CM

## 2011-01-07 DIAGNOSIS — Z23 Encounter for immunization: Secondary | ICD-10-CM

## 2011-01-07 DIAGNOSIS — I10 Essential (primary) hypertension: Secondary | ICD-10-CM

## 2011-01-07 LAB — HEMOGLOBIN A1C: Hgb A1c MFr Bld: 6.8 % — ABNORMAL HIGH (ref 4.6–6.5)

## 2011-01-07 NOTE — Progress Notes (Signed)
  Subjective:    Patient ID: [name], male    DOB: 14-Feb-1932, 77 y.o.   MRN: [id number]  HPI  77 year old patient who is seen today for followup. He has type 2 diabetes which has been controlled on Glucotrol. He has chronic kidney disease and is being followed by nephrology by annually. Renal function studies continue to improve. He has maintained nice glycemic control. Her random blood sugar today 113 hemoglobin A1c is actually been slightly greater than 7 however. He has hypertension which has been well controlled. Chronic medical issues include dyslipidemia hypertension coronary artery disease and remote history of an MI. He has peripheral vascular disease. He denies any exertional chest pain or claudication.    Review of Systems  Constitutional: Negative for fever, chills, appetite change and fatigue.  HENT: Negative for hearing loss, ear pain, congestion, sore throat, trouble swallowing, neck stiffness, dental problem, voice change and tinnitus.   Eyes: Negative for pain, discharge and visual disturbance.  Respiratory: Negative for cough, chest tightness, wheezing and stridor.   Cardiovascular: Negative for chest pain, palpitations and leg swelling.  Gastrointestinal: Negative for nausea, vomiting, abdominal pain, diarrhea, constipation, blood in stool and abdominal distention.  Genitourinary: Negative for urgency, hematuria, flank pain, discharge, difficulty urinating and genital sores.  Musculoskeletal: Negative for myalgias, back pain, joint swelling, arthralgias and gait problem.  Skin: Negative for rash.  Neurological: Negative for dizziness, syncope, speech difficulty, weakness, numbness and headaches.  Hematological: Negative for adenopathy. Does not bruise/bleed easily.  Psychiatric/Behavioral: Negative for behavioral problems and dysphoric mood. The patient is not nervous/anxious.        Objective:   Physical Exam  Constitutional: He is oriented to person, place,  and time. He appears well-developed.  HENT:  Head: Normocephalic.  Right Ear: External ear normal.  Left Ear: External ear normal.  Eyes: Conjunctivae and EOM are normal.  Neck: Normal range of motion.  Cardiovascular: Normal rate and normal heart sounds.   Pulmonary/Chest: Breath sounds normal.  Abdominal: Bowel sounds are normal.  Musculoskeletal: Normal range of motion. He exhibits no edema and no tenderness.  Neurological: He is alert and oriented to person, place, and time.  Psychiatric: He has a normal mood and affect. His behavior is normal.          Assessment & Plan:   Diabetes mellitus. We'll continue present regimen. We'll check a hemoglobin A1c Hypertension well controlled Chronic kidney disease stable Peripheral vascular disease asymptomatic Coronary artery disease stable  We'll continue present regimen and nephrology followup Return in 3 months for followup

## 2011-01-07 NOTE — Patient Instructions (Signed)
Limit your sodium (Salt) intake   Please check your hemoglobin A1c every 3 months   

## 2011-03-01 DIAGNOSIS — Z8711 Personal history of peptic ulcer disease: Secondary | ICD-10-CM

## 2011-03-01 HISTORY — DX: Personal history of peptic ulcer disease: Z87.11

## 2011-04-08 ENCOUNTER — Ambulatory Visit: Payer: PRIVATE HEALTH INSURANCE | Admitting: Internal Medicine

## 2011-04-15 ENCOUNTER — Encounter: Payer: Self-pay | Admitting: Internal Medicine

## 2011-04-15 ENCOUNTER — Ambulatory Visit (INDEPENDENT_AMBULATORY_CARE_PROVIDER_SITE_OTHER): Payer: Medicare Other | Admitting: Internal Medicine

## 2011-04-15 DIAGNOSIS — N259 Disorder resulting from impaired renal tubular function, unspecified: Secondary | ICD-10-CM

## 2011-04-15 DIAGNOSIS — E119 Type 2 diabetes mellitus without complications: Secondary | ICD-10-CM

## 2011-04-15 DIAGNOSIS — I10 Essential (primary) hypertension: Secondary | ICD-10-CM

## 2011-04-15 LAB — COMPREHENSIVE METABOLIC PANEL
ALT: 13 U/L (ref 0–53)
BUN: 37 mg/dL — ABNORMAL HIGH (ref 6–23)
CO2: 27 mEq/L (ref 19–32)
Calcium: 9.3 mg/dL (ref 8.4–10.5)
Chloride: 110 mEq/L (ref 96–112)
Creatinine, Ser: 2.2 mg/dL — ABNORMAL HIGH (ref 0.4–1.5)
GFR: 37.04 mL/min — ABNORMAL LOW (ref >60.00)

## 2011-04-15 NOTE — Patient Instructions (Signed)
Limit your sodium (Salt) intake   Please check your hemoglobin A1c every 3 months    It is important that you exercise regularly, at least 20 minutes 3 to 4 times per week.  If you develop chest pain or shortness of breath seek  medical attention.   

## 2011-04-15 NOTE — Progress Notes (Signed)
  Subjective:    Patient ID: [name], male    DOB: November 10, 1931, 78 y.o.   MRN: [id number]  HPI 78 year old patient who has a history of diabetes coronary artery disease hypertension and chronic kidney disease. He continues to do remarkably well denies any hypoglycemia. His last hemoglobin A1c was less than 7 he denies any exertional chest pain. He has a history of peripheral vascular disease which has been stable.    Review of Systems  Constitutional: Negative for fever, chills, appetite change and fatigue.  HENT: Negative for hearing loss, ear pain, congestion, sore throat, trouble swallowing, neck stiffness, dental problem, voice change and tinnitus.   Eyes: Negative for pain, discharge and visual disturbance.  Respiratory: Negative for cough, chest tightness, wheezing and stridor.   Cardiovascular: Negative for chest pain, palpitations and leg swelling.  Gastrointestinal: Negative for nausea, vomiting, abdominal pain, diarrhea, constipation, blood in stool and abdominal distention.  Genitourinary: Negative for urgency, hematuria, flank pain, discharge, difficulty urinating and genital sores.  Musculoskeletal: Negative for myalgias, back pain, joint swelling, arthralgias and gait problem.  Skin: Negative for rash.  Neurological: Negative for dizziness, syncope, speech difficulty, weakness, numbness and headaches.  Hematological: Negative for adenopathy. Does not bruise/bleed easily.  Psychiatric/Behavioral: Negative for behavioral problems and dysphoric mood. The patient is not nervous/anxious.        Objective:   Physical Exam  Constitutional: He is oriented to person, place, and time. He appears well-developed.  HENT:  Head: Normocephalic.  Right Ear: External ear normal.  Left Ear: External ear normal.  Eyes: Conjunctivae and EOM are normal.  Neck: Normal range of motion.       Right carotid bruit  Cardiovascular: Normal rate.   Murmur heard.      Grade 2/6 systolic  murmur  Pulmonary/Chest: Breath sounds normal.  Abdominal: Bowel sounds are normal.  Musculoskeletal: Normal range of motion. He exhibits no edema and no tenderness.  Neurological: He is alert and oriented to person, place, and time.  Psychiatric: He has a normal mood and affect. His behavior is normal.          Assessment & Plan:   Diabetes mellitus. We'll check a hemoglobin A1c continue Glucotrol Chronic kidney disease. We'll check renal indices today as well as electrolytes Hypertension stable Coronary artery disease stable

## 2011-05-26 ENCOUNTER — Encounter: Payer: Self-pay | Admitting: Internal Medicine

## 2011-05-26 ENCOUNTER — Ambulatory Visit (INDEPENDENT_AMBULATORY_CARE_PROVIDER_SITE_OTHER): Payer: Medicare Other | Admitting: Internal Medicine

## 2011-05-26 VITALS — BP 140/80 | Wt 163.0 lb

## 2011-05-26 DIAGNOSIS — I10 Essential (primary) hypertension: Secondary | ICD-10-CM

## 2011-05-26 DIAGNOSIS — I2510 Atherosclerotic heart disease of native coronary artery without angina pectoris: Secondary | ICD-10-CM

## 2011-05-26 DIAGNOSIS — E119 Type 2 diabetes mellitus without complications: Secondary | ICD-10-CM

## 2011-05-26 NOTE — Progress Notes (Signed)
  Subjective:    Patient ID: [name], male    DOB: 06/01/1931, 78 y.o.   MRN: [id number]  HPI  Wt Readings from Last 3 Encounters:  05/26/11 163 lb (73.936 kg)  04/15/11 164 lb (74.39 kg)  01/07/11 167 lb (75.751 kg)   Lab Results  Component Value Date   HGBA1C 6.6* 04/15/2011   78 year old patient who has diabetes chronic kidney disease and hypertension. Past couple weeks he complains of poor appetite and low energy level. There's been some minimal nausea. Laboratory screen last month was fairly unremarkable with an improved creatinine. He is scheduled for nephrology evaluation later next month. His weight is down 1 pound there has been some peripheral edema and he does take when necessary furosemide. No shortness of breath  Review of Systems  Constitutional: Positive for activity change, appetite change and fatigue. Negative for fever and chills.  HENT: Negative for hearing loss, ear pain, congestion, sore throat, trouble swallowing, neck stiffness, dental problem, voice change and tinnitus.   Eyes: Negative for pain, discharge and visual disturbance.  Respiratory: Negative for cough, chest tightness, wheezing and stridor.   Cardiovascular: Negative for chest pain, palpitations and leg swelling.  Gastrointestinal: Positive for nausea. Negative for vomiting, abdominal pain, diarrhea, constipation, blood in stool and abdominal distention.  Genitourinary: Negative for urgency, hematuria, flank pain, discharge, difficulty urinating and genital sores.  Musculoskeletal: Negative for myalgias, back pain, joint swelling, arthralgias and gait problem.  Skin: Negative for rash.  Neurological: Positive for weakness. Negative for dizziness, syncope, speech difficulty, numbness and headaches.  Hematological: Negative for adenopathy. Does not bruise/bleed easily.  Psychiatric/Behavioral: Negative for behavioral problems and dysphoric mood. The patient is not nervous/anxious.          Objective:   Physical Exam  Constitutional: He is oriented to person, place, and time. He appears well-developed.       Clinically appears well. Blood pressure 130/80.  HENT:  Head: Normocephalic.  Right Ear: External ear normal.  Left Ear: External ear normal.  Eyes: Conjunctivae and EOM are normal.  Neck: Normal range of motion.  Cardiovascular: Normal rate.   Murmur heard.      Grade 3/6 systolic murmur  Pulmonary/Chest: Effort normal and breath sounds normal. No respiratory distress. He has no wheezes. He has no rales.  Abdominal: Bowel sounds are normal.  Musculoskeletal: Normal range of motion. He exhibits edema. He exhibits no tenderness.       Mild peripheral edema right slightly greater than the left  Neurological: He is alert and oriented to person, place, and time.  Psychiatric: He has a normal mood and affect. His behavior is normal.          Assessment & Plan:   Diabetes mellitus. Recent hemoglobin A1c 6.6 Hypertension reasonable control Fatigue unclear etiology will continue to follow. Nephrology visit in 2 weeks as scheduled  Recheck here 2 months or as needed

## 2011-05-26 NOTE — Patient Instructions (Signed)
Limit your sodium (Salt) intake    It is important that you exercise regularly, at least 20 minutes 3 to 4 times per week.  If you develop chest pain or shortness of breath seek  medical attention.  Renal medicine followup next month as scheduled  Return here in 2 months

## 2011-06-09 ENCOUNTER — Emergency Department (HOSPITAL_COMMUNITY): Payer: PRIVATE HEALTH INSURANCE

## 2011-06-09 ENCOUNTER — Telehealth: Payer: Self-pay | Admitting: *Deleted

## 2011-06-09 ENCOUNTER — Inpatient Hospital Stay (HOSPITAL_COMMUNITY)
Admission: EM | Admit: 2011-06-09 | Discharge: 2011-06-11 | DRG: 378 | Disposition: A | Discharge status: Home / Self Care | Payer: PRIVATE HEALTH INSURANCE | Source: Ambulatory Visit | Attending: Internal Medicine | Admitting: Internal Medicine

## 2011-06-09 ENCOUNTER — Encounter (HOSPITAL_COMMUNITY): Payer: Self-pay

## 2011-06-09 DIAGNOSIS — I10 Essential (primary) hypertension: Secondary | ICD-10-CM | POA: Diagnosis present

## 2011-06-09 DIAGNOSIS — I739 Peripheral vascular disease, unspecified: Secondary | ICD-10-CM

## 2011-06-09 DIAGNOSIS — N259 Disorder resulting from impaired renal tubular function, unspecified: Secondary | ICD-10-CM

## 2011-06-09 DIAGNOSIS — N400 Enlarged prostate without lower urinary tract symptoms: Secondary | ICD-10-CM

## 2011-06-09 DIAGNOSIS — K254 Chronic or unspecified gastric ulcer with hemorrhage: Principal | ICD-10-CM | POA: Diagnosis present

## 2011-06-09 DIAGNOSIS — E119 Type 2 diabetes mellitus without complications: Secondary | ICD-10-CM | POA: Diagnosis present

## 2011-06-09 DIAGNOSIS — K279 Peptic ulcer, site unspecified, unspecified as acute or chronic, without hemorrhage or perforation: Secondary | ICD-10-CM

## 2011-06-09 DIAGNOSIS — E785 Hyperlipidemia, unspecified: Secondary | ICD-10-CM | POA: Diagnosis present

## 2011-06-09 DIAGNOSIS — I25709 Atherosclerosis of coronary artery bypass graft(s), unspecified, with unspecified angina pectoris: Secondary | ICD-10-CM | POA: Diagnosis present

## 2011-06-09 DIAGNOSIS — K250 Acute gastric ulcer with hemorrhage: Secondary | ICD-10-CM

## 2011-06-09 DIAGNOSIS — D509 Iron deficiency anemia, unspecified: Secondary | ICD-10-CM | POA: Diagnosis present

## 2011-06-09 DIAGNOSIS — R531 Weakness: Secondary | ICD-10-CM | POA: Diagnosis present

## 2011-06-09 DIAGNOSIS — E1122 Type 2 diabetes mellitus with diabetic chronic kidney disease: Secondary | ICD-10-CM | POA: Diagnosis present

## 2011-06-09 DIAGNOSIS — I2510 Atherosclerotic heart disease of native coronary artery without angina pectoris: Secondary | ICD-10-CM

## 2011-06-09 DIAGNOSIS — D126 Benign neoplasm of colon, unspecified: Secondary | ICD-10-CM | POA: Diagnosis present

## 2011-06-09 DIAGNOSIS — K922 Gastrointestinal hemorrhage, unspecified: Secondary | ICD-10-CM | POA: Diagnosis present

## 2011-06-09 DIAGNOSIS — I252 Old myocardial infarction: Secondary | ICD-10-CM

## 2011-06-09 DIAGNOSIS — I129 Hypertensive chronic kidney disease with stage 1 through stage 4 chronic kidney disease, or unspecified chronic kidney disease: Secondary | ICD-10-CM | POA: Diagnosis present

## 2011-06-09 DIAGNOSIS — N189 Chronic kidney disease, unspecified: Secondary | ICD-10-CM | POA: Diagnosis present

## 2011-06-09 DIAGNOSIS — K5289 Other specified noninfective gastroenteritis and colitis: Secondary | ICD-10-CM

## 2011-06-09 DIAGNOSIS — N179 Acute kidney failure, unspecified: Secondary | ICD-10-CM | POA: Diagnosis present

## 2011-06-09 DIAGNOSIS — D649 Anemia, unspecified: Secondary | ICD-10-CM | POA: Diagnosis present

## 2011-06-09 DIAGNOSIS — R195 Other fecal abnormalities: Secondary | ICD-10-CM | POA: Diagnosis present

## 2011-06-09 DIAGNOSIS — Z951 Presence of aortocoronary bypass graft: Secondary | ICD-10-CM

## 2011-06-09 LAB — URINE MICROSCOPIC-ADD ON

## 2011-06-09 LAB — URINALYSIS, ROUTINE W REFLEX MICROSCOPIC
Bilirubin Urine: NEGATIVE
Glucose, UA: NEGATIVE mg/dL
Hgb urine dipstick: NEGATIVE
Specific Gravity, Urine: 1.019 (ref 1.005–1.030)
Urobilinogen, UA: 1 mg/dL (ref 0.0–1.0)

## 2011-06-09 LAB — COMPREHENSIVE METABOLIC PANEL
ALT: 11 U/L (ref 0–53)
AST: 18 U/L (ref 0–37)
Albumin: 3.6 g/dL (ref 3.5–5.2)
CO2: 22 mEq/L (ref 19–32)
Calcium: 9.2 mg/dL (ref 8.4–10.5)
Chloride: 104 mEq/L (ref 96–112)
GFR calc non Af Amer: 19 mL/min — ABNORMAL LOW (ref >90)
Sodium: 139 mEq/L (ref 135–145)
Total Bilirubin: 0.3 mg/dL (ref 0.3–1.2)

## 2011-06-09 LAB — DIFFERENTIAL
Basophils Relative: 0 % (ref 0–1)
Eosinophils Relative: 1 % (ref 0–5)
Lymphocytes Relative: 19 % (ref 12–46)
Monocytes Absolute: 0.1 10*3/uL (ref 0.1–1.0)
Monocytes Relative: 3 % (ref 3–12)
Neutrophils Relative %: 77 % (ref 43–77)

## 2011-06-09 LAB — CBC
Hemoglobin: 5.6 g/dL — CL (ref 13.0–17.0)
MCH: 45.5 pg — ABNORMAL HIGH (ref 26.0–34.0)
Platelets: 159 10*3/uL (ref 150–400)
RBC: 1.23 MIL/uL — ABNORMAL LOW (ref 4.22–5.81)
WBC: 4 10*3/uL (ref 4.0–10.5)

## 2011-06-09 LAB — ABO/RH: ABO/RH(D): O POS

## 2011-06-09 LAB — GLUCOSE, CAPILLARY: Glucose-Capillary: 79 mg/dL (ref 70–99)

## 2011-06-09 LAB — TROPONIN I: Troponin I: <0.3 ng/mL (ref <0.30)

## 2011-06-09 LAB — PROTIME-INR: Prothrombin Time: 14.8 seconds (ref 11.6–15.2)

## 2011-06-09 LAB — OCCULT BLOOD, POC DEVICE: Fecal Occult Bld: POSITIVE

## 2011-06-09 LAB — PREPARE RBC (CROSSMATCH)

## 2011-06-09 MED ORDER — HYDROCODONE-ACETAMINOPHEN 5-325 MG PO TABS: 1.0000 | ORAL_TABLET | ORAL | Status: DC | PRN

## 2011-06-09 MED ORDER — ONDANSETRON HCL 4 MG PO TABS: 4.0000 mg | ORAL_TABLET | Freq: Four times a day (QID) | ORAL | Status: DC | PRN

## 2011-06-09 MED ORDER — ALUM & MAG HYDROXIDE-SIMETH 200-200-20 MG/5ML PO SUSP: 30.0000 mL | Freq: Four times a day (QID) | ORAL | Status: DC | PRN

## 2011-06-09 MED ORDER — SIMVASTATIN 20 MG PO TABS
20.0000 mg | ORAL_TABLET | Freq: Every evening | ORAL | Status: DC
  Administered 2011-06-09 – 2011-06-10 (×2): 20 mg via ORAL
  Filled 2011-06-09 (×3): qty 1

## 2011-06-09 MED ORDER — ACETAMINOPHEN 325 MG PO TABS: 650.0000 mg | ORAL_TABLET | Freq: Four times a day (QID) | ORAL | Status: DC | PRN

## 2011-06-09 MED ORDER — LABETALOL HCL 200 MG PO TABS
200.0000 mg | ORAL_TABLET | Freq: Two times a day (BID) | ORAL | Status: DC
  Administered 2011-06-09 – 2011-06-11 (×4): 200 mg via ORAL
  Filled 2011-06-09 (×5): qty 1

## 2011-06-09 MED ORDER — CLONIDINE HCL 0.1 MG PO TABS
0.1000 mg | ORAL_TABLET | Freq: Two times a day (BID) | ORAL | Status: DC
  Administered 2011-06-09 – 2011-06-11 (×4): 0.1 mg via ORAL
  Filled 2011-06-09 (×5): qty 1

## 2011-06-09 MED ORDER — HYDROMORPHONE HCL PF 1 MG/ML IJ SOLN: 1.0000 mg | INTRAMUSCULAR | Status: DC | PRN

## 2011-06-09 MED ORDER — PENTOXIFYLLINE ER 400 MG PO TBCR
400.0000 mg | EXTENDED_RELEASE_TABLET | Freq: Three times a day (TID) | ORAL | Status: DC
  Administered 2011-06-10: 400 mg via ORAL
  Filled 2011-06-09 (×4): qty 1

## 2011-06-09 MED ORDER — INSULIN ASPART 100 UNIT/ML ~~LOC~~ SOLN: 0.0000 [IU] | Freq: Every day | SUBCUTANEOUS | Status: DC

## 2011-06-09 MED ORDER — SODIUM CHLORIDE 0.9 % IV SOLN
INTRAVENOUS | Status: DC
  Administered 2011-06-10: 02:00:00 via INTRAVENOUS
  Administered 2011-06-10: 1000 mL via INTRAVENOUS
  Administered 2011-06-11: 04:00:00 via INTRAVENOUS

## 2011-06-09 MED ORDER — SODIUM CHLORIDE 0.9 % IV SOLN
8.0000 mg/h | INTRAVENOUS | Status: DC
  Administered 2011-06-09 – 2011-06-10 (×2): 8 mg/h via INTRAVENOUS
  Filled 2011-06-09 (×9): qty 80

## 2011-06-09 MED ORDER — INSULIN ASPART 100 UNIT/ML ~~LOC~~ SOLN
0.0000 [IU] | Freq: Three times a day (TID) | SUBCUTANEOUS | Status: DC
  Administered 2011-06-10: 1 [IU] via SUBCUTANEOUS

## 2011-06-09 MED ORDER — FELODIPINE ER 10 MG PO TB24
10.0000 mg | ORAL_TABLET | Freq: Every day | ORAL | Status: DC
  Administered 2011-06-09 – 2011-06-11 (×3): 10 mg via ORAL
  Filled 2011-06-09 (×3): qty 1

## 2011-06-09 MED ORDER — ONDANSETRON HCL 4 MG/2ML IJ SOLN: 4.0000 mg | Freq: Four times a day (QID) | INTRAMUSCULAR | Status: DC | PRN

## 2011-06-09 MED ORDER — SODIUM CHLORIDE 0.9 % IV SOLN
80.0000 mg | INTRAVENOUS | Status: AC
  Administered 2011-06-09: 80 mg via INTRAVENOUS
  Filled 2011-06-09: qty 80

## 2011-06-09 MED ORDER — ACETAMINOPHEN 650 MG RE SUPP: 650.0000 mg | Freq: Four times a day (QID) | RECTAL | Status: DC | PRN

## 2011-06-09 MED ORDER — SODIUM CHLORIDE 0.9 % IJ SOLN
3.0000 mL | Freq: Two times a day (BID) | INTRAMUSCULAR | Status: DC
  Administered 2011-06-09 – 2011-06-10 (×3): 3 mL via INTRAVENOUS

## 2011-06-09 NOTE — ED Notes (Signed)
Dr. Tana Coast is at the bedside

## 2011-06-09 NOTE — Telephone Encounter (Signed)
Son called stating pt was at home and he is out of town.  His work notified him that pt is having slurred speech, chest pain and is off balance.  We called 911 for them, and they are in route to pt's home.

## 2011-06-09 NOTE — ED Notes (Signed)
[id number] Ready

## 2011-06-09 NOTE — ED Notes (Signed)
Patient aware that we need a urine specimen. Urinal at bedside.

## 2011-06-09 NOTE — ED Notes (Signed)
Attempted to call report, but Charge Nurse said that there is no assigned nurse for [facility] yet. Will call back

## 2011-06-09 NOTE — ED Provider Notes (Signed)
Pt with significant anemia and is symptomatic He is stable at this time/awake/alert Will need admission BP 157/68  Pulse 78  Temp(Src) 98.1 F (36.7 C) (Oral)  Resp 18  Ht 5\' 11"  (1.803 m)  Wt 165 lb (74.844 kg)  BMI 23.01 kg/m2  SpO2 100%   Sharyon Cable, MD 06/09/11 1610

## 2011-06-09 NOTE — ED Notes (Signed)
Pt tolerating his blood transfusion. Will continue to monitor

## 2011-06-09 NOTE — Telephone Encounter (Signed)
Pt's son called back and was in touch with 911 at his father's house.

## 2011-06-09 NOTE — ED Notes (Signed)
Pt was received to [facility] with c/o generalized weakness , with shortness of breath and chest discomfort on exertion. Pt denies any chest pain, no dizziness at present. Pt is A/A/Ox4, skin is warm and dry, respiration is even and unlabored.

## 2011-06-09 NOTE — H&P (Signed)
History and Physical       Hospital Admission Note Date: 06/09/2011  Patient name: [name] Medical record number: [id number] Date of birth: 02-11-1932 Age: 79 y.o. Gender: male PCP: [name], MD, MD  Attending physician: Mendel Corning, MD   Chief Complaint:  Generalized weakness with dyspnea on exertion, dizziness worsening over last 1 or 2 weeks  HPI:  Patient is a 78 year old male with history of coronary disease, diabetes, hyperlipidemia, hypertension, CKD presented to [facility] ED with symptoms of worsening generalized weakness for last 1 or 2 weeks. History was obtained from the patient and his family members present in the room. Patient stated that his symptoms actually started 3 weeks ago however worse in the last 1 or 2 weeks, he started noticing malaise and fatigue with increasing generalized weakness. He also started having dyspnea on exertion, loss of appetite. He did endorse having nausea and 2 episodes of vomiting in the last 1 week. He denied any hematemesis, hematochezia or melena. ED workup showed hemoglobin of 5.6 and a creatinine of 3.0, patient denied any frank bleeding. He also admitted to take aspirin daily with alleve for pain. He had a prior colonoscopy in March 2010 with Dr. Deatra Ina which was essentially unremarkable except for polyps (per patient).  Review of Systems:   Constitutional: Denies fever, chills, diaphoresis, positive appetite. change and fatigue.  HEENT: Denies photophobia, eye pain, redness, hearing loss, ear pain, congestion, sore throat, rhinorrhea, sneezing, mouth sores, trouble swallowing, neck pain, neck stiffness and tinnitus.   Respiratory: Denies SOB, cough, chest tightness,  and wheezing.   positive DOE Cardiovascular: Denies chest pain, palpitations and leg swelling.  Gastrointestinal: Denies abdominal pain, diarrhea, constipation, blood in stool and abdominal distention.   positive nausea and vomiting with no hematemesis  Genitourinary: Denies dysuria, urgency, frequency, hematuria, flank pain and difficulty urinating.  Musculoskeletal: Denies myalgias, back pain, joint swelling, arthralgias and gait problem.  Skin: Denies pallor, rash and wound.  Neurological: Denies seizures, syncope, weakness, numbness and headaches.  patient is having dizziness and lightheadedness in the last 2 weeks  Hematological: Denies adenopathy. Easy bruising, personal or family bleeding history  Psychiatric/Behavioral: Denies suicidal ideation, mood changes, confusion, nervousness, sleep disturbance and agitation  Past Medical History: Past Medical History  Diagnosis Date  . CAD (coronary artery disease)   . Diabetes mellitus   . Hyperlipidemia   . Hypertension   . Myocardial infarction   . Renal insufficiency    Past Surgical History  Procedure Date  . Coronary artery bypass graft 2003    Medications: Prior to Admission medications   Medication Sig Start Date End Date Taking? Authorizing Provider  aspirin 325 MG tablet Take 325 mg by mouth daily.    Yes Historical Provider, MD  benazepril (LOTENSIN) 40 MG tablet Take 40 mg by mouth daily.   Yes Historical Provider, MD  cloNIDine (CATAPRES) 0.1 MG tablet Take 0.1 mg by mouth 2 (two) times daily.   Yes Historical Provider, MD  felodipine (PLENDIL) 10 MG 24 hr tablet Take 10 mg by mouth daily.   Yes Historical Provider, MD  furosemide (LASIX) 40 MG tablet Take 40 mg by mouth daily.   Yes Historical Provider, MD  glipiZIDE (GLUCOTROL) 5 MG tablet Take 5 mg by mouth 2 (two) times daily before a meal.   Yes Historical Provider, MD  labetalol (NORMODYNE) 200 MG tablet Take 200 mg by mouth 2 (two) times daily.   Yes Historical Provider, MD  Multiple Vitamin (MULTIVITAMIN)  tablet Take 1 tablet by mouth daily.   Yes Historical Provider, MD  pentoxifylline (TRENTAL) 400 MG CR tablet Take 400 mg by mouth 3 (three) times daily with  meals.   Yes Historical Provider, MD  simvastatin (ZOCOR) 20 MG tablet Take 20 mg by mouth every evening.   Yes Historical Provider, MD  simvastatin (ZOCOR) 20 MG tablet Take 1 tablet (20 mg total) by mouth at bedtime. 10/01/10 10/01/11  Marletta Lor, MD    Allergies:  No Known Allergies  Social History:  reports that he has quit smoking. He has never used smokeless tobacco. He reports that he does not drink alcohol or use illicit drugs.  Family History: Family History  Problem Relation Age of Onset  . Diabetes      brothers and sisters  . Hypertension    . Coronary artery disease    . Stroke Mother   . Hypertension Mother   . Cancer Mother     Physical Exam: Blood pressure 145/53, pulse 76, temperature 98.1 F (36.7 C), temperature source Oral, resp. rate 15, height 5\' 11"  (1.803 m), weight 74.844 kg (165 lb), SpO2 100.00%. General: Alert, awake, oriented x3, in no acute distress. HEENT: anicteric sclera,  Pale conjunctiva, pupils equal and reactive to light and accomodation, dry mucosal membranes  Neck: supple, no masses or lymphadenopathy, no goiter, no bruits  Heart: Regular rate and rhythm, without murmurs, rubs or gallops. Lungs: Clear to auscultation bilaterally, no wheezing, rales or rhonchi. Abdomen: Soft, nontender, nondistended, positive bowel sounds, no masses. Extremities: No clubbing, cyanosis or edema with positive pedal pulses. Neuro: Grossly intact, no focal neurological deficits, strength 5/5 upper and lower extremities bilaterally Psych: alert and oriented x 3, normal mood and affect Skin: no rashes or lesions, warm and dry   LABS on Admission:  Basic Metabolic Panel:  Lab [id number] 1439  NA 139  K 4.4  CL 104  CO2 22  GLUCOSE 93  BUN 60*  CREATININE 3.00*  CALCIUM 9.2  MG --  PHOS --   Liver Function Tests:  Lab 06/09/11 1439  AST 18  ALT 11  ALKPHOS 55  BILITOT 0.3  PROT 7.1  ALBUMIN 3.6   CBC:  Lab 06/09/11 1525  WBC 4.0    NEUTROABS 3.1  HGB 5.6*  HCT 15.4*  MCV 125.2*  PLT 159   Cardiac Enzymes:  Lab 06/09/11 1439  CKTOTAL --  CKMB --  CKMBINDEX --  TROPONINI <0.30   BNP: No components found with this basename: POCBNP:2 CBG: No results found for this basename: GLUCAP:2 in the last 168 hours   Radiological Exams on Admission: No results found.  Assessment/Plan Present on Admission:   .Anemia /GI bleed: Likely acute on chronic anemia due to occult GI bleeding with positive FOBT, possibly from NSAIDs, aspirin - Admit to telemetry, H/H q8hrs, IV protonix, type and screen and transfuse 2 units packed RBCs, anemia panel - Placed on clear liquid diet - GI consult called, discussed with Dr. Carlean Purl.    .Acute renal failure (ARF) - Obtain renal ultrasound, likely worsened due to severe anemia, SPEP/UPEP  - Continue gentle hydration and packed RBC transfusion  - Hold ACE inhibitor, Lasix  .Generalized weakness - Continue above management, obtain PT OT evaluation   .Benign neoplasm of colon: per the prior colonoscopy 04/2008   .DIABETES MELLITUS, TYPE II: - Placed on sliding scale insulin, obtain HbA1c   .HYPERLIPIDEMIA: Obtain lipid panel, continue statins   .HYPERTENSION: Continue labetalol, felodipine, clonidine   .  CORONARY ARTERY DISEASE - Hold aspirin, continue statin, BP control   .PEPTIC ULCER DISEASE - Hold aspirin, NSAIDs, place on PPI drip   DVT prophylaxis:  SCDs  CODE STATUS:  Full code  Family contact: Leeum Pelts (patient's son) phone number [phone], Eden Lathe (granddaughter) [phone]  Further plan will depend as patient's clinical course evolves and further radiologic and laboratory data become available.   @Time  Spent on Admission: 1 hour Asharia Lotter M.D. [facility] 06/09/2011, 5:21 PM

## 2011-06-09 NOTE — ED Notes (Signed)
Blood ready

## 2011-06-09 NOTE — ED Provider Notes (Signed)
History     CSN: [id number]  Arrival date & time 06/09/11  1405   First MD Initiated Contact with Patient 06/09/11 1510      Chief Complaint  Patient presents with  . Weakness    (Consider location/radiation/quality/duration/timing/severity/associated sxs/prior treatment) Patient is a 78 y.o. male presenting with weakness. The history is provided by the patient and a relative.  Weakness Primary symptoms do not include headaches, loss of consciousness, altered mental status, focal weakness, fever, nausea or vomiting. The symptoms began more than 1 week ago (2-3 weeks ago). The symptoms are unchanged. The neurological symptoms are diffuse. Context: no inciting factors.  Additional symptoms include weakness. Medical issues also include diabetes and hypertension. Workup history includes cardiac workup.    Past Medical History  Diagnosis Date  . CAD (coronary artery disease)   . Diabetes mellitus   . Hyperlipidemia   . Hypertension   . Myocardial infarction   . Renal insufficiency     Past Surgical History  Procedure Date  . Coronary artery bypass graft 2003    Family History  Problem Relation Age of Onset  . Diabetes      brothers and sisters  . Hypertension    . Coronary artery disease    . Stroke Mother   . Hypertension Mother   . Cancer Mother     History  Substance Use Topics  . Smoking status: Former Research scientist (life sciences)  . Smokeless tobacco: Never Used  . Alcohol Use: No      Review of Systems  Constitutional: Negative for fever.  HENT: Negative for neck pain.   Respiratory: Positive for shortness of breath. Negative for chest tightness.   Cardiovascular: Negative for chest pain.  Gastrointestinal: Negative for nausea, vomiting, abdominal pain, diarrhea and blood in stool.  Genitourinary: Negative for dysuria.  Neurological: Positive for weakness. Negative for focal weakness, loss of consciousness and headaches.  Psychiatric/Behavioral: Negative for altered mental  status.  All other systems reviewed and are negative.    Allergies  Review of patient's allergies indicates no known allergies.  Home Medications   Current Outpatient Rx  Name Route Sig Dispense Refill  . ASPIRIN 325 MG PO TABS Oral Take 325 mg by mouth daily.     Marland Kitchen BENAZEPRIL HCL 40 MG PO TABS Oral Take 40 mg by mouth daily.    Marland Kitchen CLONIDINE HCL 0.1 MG PO TABS Oral Take 0.1 mg by mouth 2 (two) times daily.    Marland Kitchen FELODIPINE ER 10 MG PO TB24 Oral Take 10 mg by mouth daily.    . FUROSEMIDE 40 MG PO TABS Oral Take 40 mg by mouth daily.    Marland Kitchen GLIPIZIDE 5 MG PO TABS Oral Take 5 mg by mouth 2 (two) times daily before a meal.    . LABETALOL HCL 200 MG PO TABS Oral Take 200 mg by mouth 2 (two) times daily.    Marland Kitchen ONE-DAILY MULTI VITAMINS PO TABS Oral Take 1 tablet by mouth daily.    Marland Kitchen PENTOXIFYLLINE ER 400 MG PO TBCR Oral Take 400 mg by mouth 3 (three) times daily with meals.    Marland Kitchen SIMVASTATIN 20 MG PO TABS Oral Take 20 mg by mouth every evening.    Marland Kitchen SIMVASTATIN 20 MG PO TABS Oral Take 1 tablet (20 mg total) by mouth at bedtime. 90 tablet 6    BP 131/60  Pulse 80  Temp(Src) 98.1 F (36.7 C) (Oral)  Resp 11  Ht 5\' 11"  (1.803 m)  Wt 165  lb (74.844 kg)  BMI 23.01 kg/m2  SpO2 99%  Physical Exam  Constitutional: He is oriented to person, place, and time. He appears well-developed and well-nourished.  HENT:  Head: Normocephalic and atraumatic.  Eyes: Conjunctivae and EOM are normal.  Neck: Normal range of motion.  Cardiovascular: Normal rate, regular rhythm and normal heart sounds.   Pulmonary/Chest: Effort normal and breath sounds normal. No respiratory distress.  Abdominal: Soft. There is no tenderness. There is no rebound.  Musculoskeletal: Normal range of motion. He exhibits no edema.  Neurological: He is alert and oriented to person, place, and time.  Skin: Skin is warm and dry.  Psychiatric: He has a normal mood and affect.    ED Course  Procedures (including critical care  time)  EKG:  sinus rhythm, nonspecific ST and T waves changes, Q waves in inferior lead, rate 87 .  Labs Reviewed  COMPREHENSIVE METABOLIC PANEL - Abnormal; Notable for the following:    BUN 60 (*)    Creatinine, Ser 3.00 (*)    GFR calc non Af Amer 19 (*)    GFR calc Af Amer 22 (*)    All other components within normal limits  URINALYSIS, ROUTINE W REFLEX MICROSCOPIC - Abnormal; Notable for the following:    Protein, ur 30 (*)    All other components within normal limits  CBC - Abnormal; Notable for the following:    RBC 1.23 (*)    Hemoglobin 5.6 (*)    HCT 15.4 (*)    MCV 125.2 (*)    MCH 45.5 (*)    MCHC 36.4 (*)    RDW 17.3 (*)    All other components within normal limits  URINE MICROSCOPIC-ADD ON - Abnormal; Notable for the following:    Casts HYALINE CASTS (*) GRANULAR CAST   All other components within normal limits  TROPONIN I  PROTIME-INR  DIFFERENTIAL  OCCULT BLOOD, POC DEVICE  TYPE AND SCREEN   Dg Chest 2 View  06/09/2011  *RADIOLOGY REPORT*  Clinical Data: Chest pain, weakness, shortness of breath, coughing, hypertension, diabetes.  CHEST - 2 VIEW  Comparison: None.  Findings: Cardiac silhouette is upper normal size.  Slight ectasia of thoracic aorta is present. The patient has undergone previous median sternotomy and coronary artery bypass grafting.  No pulmonary edema, pneumonia, or pleural effusion is evident.  There are osteophytes in the spine.  IMPRESSION: No acute cardiopulmonary or pleural abnormality is evident. Chronic findings detailed above.  Original Report Authenticated By: Delane Ginger, M.D.     1. Anemia       MDM   Results for orders placed during the hospital encounter of 06/09/11  COMPREHENSIVE METABOLIC PANEL      Component Value Range   Sodium 139  135 - 145 (mEq/L)   Potassium 4.4  3.5 - 5.1 (mEq/L)   Chloride 104  96 - 112 (mEq/L)   CO2 22  19 - 32 (mEq/L)   Glucose, Bld 93  70 - 99 (mg/dL)   BUN 60 (*) 6 - 23 (mg/dL)    Creatinine, Ser 3.00 (*) 0.50 - 1.35 (mg/dL)   Calcium 9.2  8.4 - 10.5 (mg/dL)   Total Protein 7.1  6.0 - 8.3 (g/dL)   Albumin 3.6  3.5 - 5.2 (g/dL)   AST 18  0 - 37 (U/L)   ALT 11  0 - 53 (U/L)   Alkaline Phosphatase 55  39 - 117 (U/L)   Total Bilirubin 0.3  0.3 - 1.2 (  mg/dL)   GFR calc non Af Amer 19 (*) >90 (mL/min)   GFR calc Af Amer 22 (*) >90 (mL/min)  TROPONIN I      Component Value Range   Troponin I <0.30  <0.30 (ng/mL)  PROTIME-INR      Component Value Range   Prothrombin Time 14.8  11.6 - 15.2 (seconds)   INR 1.14  0.00 - 1.49     Pt presents with 2-3 weeks of generalized fatigue, decreased appetite.  Endorsed a "few pounds" of weight loss over that time. Denies chest pain, fevers, N/V/D, abd pain.  Does have some dyspnea on exertion, but otherwise denies resp complaints.   Exam benign. No focal deficits.  EKG without acute ischemic change.     Labs with new anemia. Weakly hemoccult positive, brown colored stool. Cr slightly up from prior, with elevated BUN.  Pt HD stable.  Type and cross added.  Consult from [facility], who will admit.       Helyn App, MD 06/09/11 2041

## 2011-06-09 NOTE — ED Notes (Signed)
Pt was brought in by EMS with c/o generalized weakness. Patient claimed that he gets shortness of breath and chest discomfort with exertion. Pt denies any cough nor fever.

## 2011-06-09 NOTE — ED Notes (Signed)
Dr. Christy Gentles is at the bedside

## 2011-06-09 NOTE — ED Provider Notes (Signed)
I have personally seen and examined the patient.  I have discussed the plan of care with the resident.  I have reviewed the documentation on PMH/FH/Soc. History.  I have reviewed the documentation of the resident and agree.  I have reviewed and agree with the ECG interpretation(s) documented by the resident.  Pt well appearing, no distress, nontoxic in appearance.  Will need admission for anemia  Sharyon Cable, MD 06/09/11 [phone]

## 2011-06-10 ENCOUNTER — Encounter (HOSPITAL_COMMUNITY)
Admission: EM | Disposition: A | Discharge status: Home / Self Care | Payer: Self-pay | Source: Ambulatory Visit | Attending: Internal Medicine

## 2011-06-10 ENCOUNTER — Encounter (HOSPITAL_COMMUNITY): Payer: Self-pay | Admitting: *Deleted

## 2011-06-10 DIAGNOSIS — K250 Acute gastric ulcer with hemorrhage: Secondary | ICD-10-CM

## 2011-06-10 DIAGNOSIS — R195 Other fecal abnormalities: Secondary | ICD-10-CM | POA: Diagnosis present

## 2011-06-10 HISTORY — PX: ESOPHAGOGASTRODUODENOSCOPY: SHX5428

## 2011-06-10 LAB — HEMOGLOBIN A1C
Hgb A1c MFr Bld: 5.8 % — ABNORMAL HIGH (ref <5.7)
Mean Plasma Glucose: 120 mg/dL — ABNORMAL HIGH (ref <117)

## 2011-06-10 LAB — RETICULOCYTES: Retic Count, Absolute: 20.8 10*3/uL (ref 19.0–186.0)

## 2011-06-10 LAB — BASIC METABOLIC PANEL
CO2: 23 mEq/L (ref 19–32)
Calcium: 9 mg/dL (ref 8.4–10.5)
Chloride: 108 mEq/L (ref 96–112)
Creatinine, Ser: 2.41 mg/dL — ABNORMAL HIGH (ref 0.50–1.35)
Glucose, Bld: 90 mg/dL (ref 70–99)

## 2011-06-10 LAB — IRON AND TIBC
Saturation Ratios: 34 % (ref 20–55)
UIBC: 131 ug/dL (ref 125–400)

## 2011-06-10 LAB — FERRITIN: Ferritin: 177 ng/mL (ref 22–322)

## 2011-06-10 LAB — GLUCOSE, CAPILLARY: Glucose-Capillary: 114 mg/dL — ABNORMAL HIGH (ref 70–99)

## 2011-06-10 SURGERY — EGD (ESOPHAGOGASTRODUODENOSCOPY)
Anesthesia: Moderate Sedation

## 2011-06-10 MED ORDER — FENTANYL NICU IV SYRINGE 50 MCG/ML
INJECTION | INTRAMUSCULAR | Status: DC | PRN
  Administered 2011-06-10: 25 ug via INTRAVENOUS

## 2011-06-10 MED ORDER — DIPHENHYDRAMINE HCL 50 MG/ML IJ SOLN
25.0000 mg | Freq: Once | INTRAMUSCULAR | Status: AC
  Administered 2011-06-10: 25 mg via INTRAVENOUS
  Filled 2011-06-10: qty 1

## 2011-06-10 MED ORDER — CYANOCOBALAMIN 1000 MCG/ML IJ SOLN
1000.0000 ug | Freq: Every day | INTRAMUSCULAR | Status: DC
  Administered 2011-06-10 – 2011-06-11 (×2): 1000 ug via INTRAMUSCULAR
  Filled 2011-06-10 (×2): qty 1

## 2011-06-10 MED ORDER — BUTAMBEN-TETRACAINE-BENZOCAINE 2-2-14 % EX AERO
INHALATION_SPRAY | CUTANEOUS | Status: DC | PRN
  Administered 2011-06-10: 2 via TOPICAL

## 2011-06-10 MED ORDER — MIDAZOLAM HCL 10 MG/2ML IJ SOLN
INTRAMUSCULAR | Status: DC | PRN
  Administered 2011-06-10: 2 mg via INTRAVENOUS

## 2011-06-10 MED ORDER — MIDAZOLAM HCL 10 MG/2ML IJ SOLN
INTRAMUSCULAR | Status: AC
  Filled 2011-06-10: qty 2

## 2011-06-10 MED ORDER — FUROSEMIDE 10 MG/ML IJ SOLN
20.0000 mg | Freq: Once | INTRAMUSCULAR | Status: AC
  Administered 2011-06-10: 20 mg via INTRAVENOUS
  Filled 2011-06-10: qty 2

## 2011-06-10 MED ORDER — ACETAMINOPHEN 325 MG PO TABS
650.0000 mg | ORAL_TABLET | Freq: Once | ORAL | Status: AC
  Administered 2011-06-10: 650 mg via ORAL
  Filled 2011-06-10: qty 2

## 2011-06-10 MED ORDER — FENTANYL CITRATE 0.05 MG/ML IJ SOLN
INTRAMUSCULAR | Status: AC
Start: 2011-06-10 — End: 2011-06-10
  Filled 2011-06-10: qty 2

## 2011-06-10 NOTE — Progress Notes (Signed)
Patient ID: [name], male   DOB: Feb 18, 1932, 78 y.o.   MRN: [id number] PATIENT DETAILS Name: [name] Age: 78 y.o. Sex: male Date of Birth: 1932/02/18 Admit Date: 06/09/2011 [id number] FRANK, MD, MD    Interim History: Received 2 units PRBCs.  Upper endoscopy 4/12 showed 6 mm ulcer in gastric antrum  Subjective: No complaints.  Takes Aleve and ASA as needed.  Not for a specific pain but just because he feels bad in general.  Reports he has never had dark stools or blood per rectum.  Objective: Weight change:   Intake/Output Summary (Last 24 hours) at 06/10/11 1607 Last data filed at 06/10/11 0500  Gross per 24 hour  Intake    353 ml  Output    450 ml  Net    -97 ml   Blood pressure 100/45, pulse 67, temperature 97.4 F (36.3 C), temperature source Oral, resp. rate 20, height 5\' 11"  (1.803 m), weight 74.844 kg (165 lb), SpO2 100.00%. Filed Vitals:   06/10/11 1430 06/10/11 1439 06/10/11 1449 06/10/11 1500  BP: 101/42 93/35 98/34  100/45  Pulse:      Temp:      TempSrc:      Resp: 49 20 20 20   Height:      Weight:      SpO2: 100% 100% 100% 100%    Physical Exam: General: No acute distress Lungs: Clear to auscultation bilaterally without wheezes or crackles Cardiovascular: Regular rate and rhythm without murmur gallop or rub normal S1 and S2 Abdomen: Nontender, nondistended, soft, bowel sounds positive, no rebound, no ascites, no appreciable mass Extremities: No significant cyanosis, clubbing, or edema bilateral lower extremities  Basic Metabolic Panel:  Lab [id number] 0600 06/09/11 1439  NA 141 139  K 4.0 4.4  CL 108 104  CO2 23 22  GLUCOSE 90 93  BUN 52* 60*  CREATININE 2.41* 3.00*  CALCIUM 9.0 9.2  MG -- --  PHOS -- --   Liver Function Tests:  Lab 06/09/11 1439  AST 18  ALT 11  ALKPHOS 55  BILITOT 0.3  PROT 7.1  ALBUMIN 3.6   CBC:  Lab 06/09/11 1525  WBC 4.0  NEUTROABS 3.1  HGB 5.6*  HCT 15.4*  MCV 125.2*  PLT  159   Cardiac Enzymes:  Lab 06/09/11 1439  CKTOTAL --  CKMB --  CKMBINDEX --  TROPONINI <0.30   CBG:  Lab 06/10/11 1203 06/10/11 0804 06/09/11 2119  GLUCAP 114* 88 79   Hemoglobin A1C:  Lab 06/10/11 0600  HGBA1C 5.8*   Coagulation:  Lab 06/09/11 1439  LABPROT 14.8  INR 1.14   Anemia Panel:  Lab 06/10/11 0600  VITAMINB12 48*  FOLATE >20.0  FERRITIN 177  TIBC 198*  IRON 67  RETICCTPCT 1.0    Studies/Results: Scheduled Meds:   . cloNIDine  0.1 mg Oral BID  . felodipine  10 mg Oral Daily  . insulin aspart  0-5 Units Subcutaneous QHS  . insulin aspart  0-9 Units Subcutaneous TID WC  . labetalol  200 mg Oral BID  . pantoprazole (PROTONIX) IV  80 mg Intravenous To Minor  . pentoxifylline  400 mg Oral TID WC  . simvastatin  20 mg Oral QPM  . sodium chloride  3 mL Intravenous Q12H   Continuous Infusions:   . sodium chloride 1,000 mL (06/10/11 1301)  . pantoprozole (PROTONIX) infusion 8 mg/hr (06/10/11 1025)   PRN Meds:.acetaminophen, acetaminophen, alum & mag hydroxide-simeth, HYDROcodone-acetaminophen, HYDROmorphone, ondansetron (ZOFRAN) IV, ondansetron,  DISCONTD: butamben-tetracaine-benzocaine, DISCONTD: fentaNYL, DISCONTD: midazolam  Anti-infectives:  Anti-infectives    None      Assessment/Plan: Principal Problem:  *Anemia Active Problems:  Benign neoplasm of colon  DIABETES MELLITUS, TYPE II  HYPERLIPIDEMIA  HYPERTENSION  CORONARY ARTERY DISEASE  PEPTIC ULCER DISEASE  GI bleed  Acute renal failure (ARF)  Generalized weakness  Nonspecific abnormal finding in stool contents  Acute gastric ulcer with hemorrhage, without mention of obstruction  1. Gastric Ulcer.  Stop NSAIDS, Daily PPI, Await Biopsy results.  Appreciate GI.  2. Anemia-Secondary to acute blood loss, S/P 2 units of PRBC transfusion, will check post transfusion CBC  3.  Acute on Chronic Renal Failure.  Resolving.  Baseline creatinine 2.2.  4.  Pernicious Anemia (B12  deficiency)  Will start injections.   5.  DM.  CBGs controlled.  On sliding scale with qhs coverage.  6.  Hyperlipidemia.  On statin.  DVT Prophylaxis:  SCDs   LOS: 1 day   Melton Alar 06/10/2011, 4:07 PM [phone]  Attending  I have seen and examined this patient, I agree with the assessment and plan as outlined above.  Dr Nena Alexander

## 2011-06-10 NOTE — Consult Note (Signed)
I have taken a history, examined the patient and reviewed the chart. I agree with the extender's note, impression and recommendations.  Konor Noren T Faren Florence MD FACG 

## 2011-06-10 NOTE — Consult Note (Signed)
[facility] Consultation  Referring Provider: [facility] Primary Care Physician:  [name], MD, MD Primary Gastroenterologist:   Erskine Emery, MD Reason for Consultation:  Anemia, heme positive stool  HPI: [name] is a 78 y.o. male with multiple medical problems known to Dr. Deatra Ina for a history of colon polyps and colitis. He was last seen in 2010. Patient admitted yesterday with weakenss, dyspnea and loss of appetite. He is being treated for ARF. Patient found to be profoundly anemic with hemoglobin of 5.6, it was 11.7 this time last year. He is heme positive, no overt GI bleeding. He takes ASA ,Plendil and an Aleve 2-3 times a week. Patient has no abdominal pain. He did have some nausea and vomiting a few days ago but denies hematemesis. He doesn't look at stool so unsure if any bleeding. No GERD, dysphagia, or other GI complaints.    Past Medical History  Diagnosis Date  . CAD (coronary artery disease)   . Diabetes mellitus   . Hyperlipidemia   . Hypertension   . Myocardial infarction   . Renal insufficiency     Past Surgical History  Procedure Date  . Coronary artery bypass graft 2003    Prior to Admission medications   Medication Sig Start Date End Date Taking? Authorizing Provider  aspirin 325 MG tablet Take 325 mg by mouth daily.    Yes Historical Provider, MD  benazepril (LOTENSIN) 40 MG tablet Take 40 mg by mouth daily.   Yes Historical Provider, MD  cloNIDine (CATAPRES) 0.1 MG tablet Take 0.1 mg by mouth 2 (two) times daily.   Yes Historical Provider, MD  felodipine (PLENDIL) 10 MG 24 hr tablet Take 10 mg by mouth daily.   Yes Historical Provider, MD  furosemide (LASIX) 40 MG tablet Take 40 mg by mouth daily.   Yes Historical Provider, MD  glipiZIDE (GLUCOTROL) 5 MG tablet Take 5 mg by mouth 2 (two) times daily before a meal.   Yes Historical Provider, MD  labetalol (NORMODYNE) 200 MG tablet Take 200 mg by mouth 2 (two) times  daily.   Yes Historical Provider, MD  Multiple Vitamin (MULTIVITAMIN) tablet Take 1 tablet by mouth daily.   Yes Historical Provider, MD  pentoxifylline (TRENTAL) 400 MG CR tablet Take 400 mg by mouth 3 (three) times daily with meals.   Yes Historical Provider, MD  simvastatin (ZOCOR) 20 MG tablet Take 20 mg by mouth every evening.   Yes Historical Provider, MD  simvastatin (ZOCOR) 20 MG tablet Take 1 tablet (20 mg total) by mouth at bedtime. 10/01/10 10/01/11  Marletta Lor, MD    Current Facility-Administered Medications  Medication Dose Route Frequency Provider Last Rate Last Dose  . 0.9 %  sodium chloride infusion   Intravenous Continuous Ripudeep Krystal Eaton, MD 75 mL/hr at 06/10/11 0133    . acetaminophen (TYLENOL) tablet 650 mg  650 mg Oral Q6H PRN Ripudeep Krystal Eaton, MD       Or  . acetaminophen (TYLENOL) suppository 650 mg  650 mg Rectal Q6H PRN Ripudeep Krystal Eaton, MD      . alum & mag hydroxide-simeth (MAALOX/MYLANTA) 200-200-20 MG/5ML suspension 30 mL  30 mL Oral Q6H PRN Ripudeep K Rai, MD      . cloNIDine (CATAPRES) tablet 0.1 mg  0.1 mg Oral BID Ripudeep K Rai, MD   0.1 mg at 06/09/11 2120  . felodipine (PLENDIL) 24 hr tablet 10 mg  10 mg Oral Daily Ripudeep Krystal Eaton, MD  10 mg at 06/09/11 2120  . HYDROcodone-acetaminophen (NORCO) 5-325 MG per tablet 1 tablet  1 tablet Oral Q4H PRN Ripudeep K Rai, MD      . HYDROmorphone (DILAUDID) injection 1 mg  1 mg Intravenous Q4H PRN Ripudeep K Rai, MD      . insulin aspart (novoLOG) injection 0-5 Units  0-5 Units Subcutaneous QHS Ripudeep K Rai, MD      . insulin aspart (novoLOG) injection 0-9 Units  0-9 Units Subcutaneous TID WC Ripudeep K Rai, MD      . labetalol (NORMODYNE) tablet 200 mg  200 mg Oral BID Ripudeep Krystal Eaton, MD   200 mg at 06/09/11 2120  . ondansetron (ZOFRAN) tablet 4 mg  4 mg Oral Q6H PRN Ripudeep Krystal Eaton, MD       Or  . ondansetron (ZOFRAN) injection 4 mg  4 mg Intravenous Q6H PRN Ripudeep K Rai, MD      . pantoprazole (PROTONIX) 80 mg in  sodium chloride 0.9 % 100 mL IVPB  80 mg Intravenous To Minor Ripudeep Krystal Eaton, MD   80 mg at 06/09/11 2002  . pantoprazole (PROTONIX) 80 mg in sodium chloride 0.9 % 250 mL infusion  8 mg/hr Intravenous Continuous Ripudeep K Rai, MD 25 mL/hr at 06/09/11 2002 8 mg/hr at 06/09/11 2002  . pentoxifylline (TRENTAL) CR tablet 400 mg  400 mg Oral TID WC Ripudeep K Rai, MD      . simvastatin (ZOCOR) tablet 20 mg  20 mg Oral QPM Ripudeep K Rai, MD   20 mg at 06/09/11 2120  . sodium chloride 0.9 % injection 3 mL  3 mL Intravenous Q12H Ripudeep K Rai, MD   3 mL at 06/09/11 2121    Allergies as of 06/09/2011  . (No Known Allergies)    Family History  Problem Relation Age of Onset  . Diabetes      brothers and sisters  . Hypertension    . Coronary artery disease    . Stroke Mother   . Hypertension Mother   . Cancer Mother     History   Social History  . Marital Status: Widowed    Spouse Name: N/A    Number of Children: N/A  . Years of Education: N/A   Occupational History  . Not on file.   Social History Main Topics  . Smoking status: Former Smoker    Quit date: 02/28/1985  . Smokeless tobacco: Never Used  . Alcohol Use: No  . Drug Use: No  . Sexually Active: Not Currently   Review of Systems: All other systems negative except where noted in HPI.  PHYSICAL EXAM: Vital signs in last 24 hours: Temp:  [97.4 F (36.3 C)-98.2 F (36.8 C)] 97.6 F (36.4 C) (04/12 0629) Pulse Rate:  [64-89] 64  (04/12 0629) Resp:  [0-20] 16  (04/12 0629) BP: (125-174)/(50-84) 130/51 mmHg (04/12 0629) SpO2:  [99 %-100 %] 100 % (04/12 0629) Weight:  [165 lb (74.844 kg)] 165 lb (74.844 kg) (04/11 1410) Last BM Date: 06/09/11 General:   Pleasant black malein NAD Head:  Normocephalic and atraumatic. Eyes:   No icterus.   Conjunctiva pale. Ears:  Normal auditory acuity. Neck:  Supple; no masses felt Lungs:  Respirations even and unlabored. Lungs clear to auscultation bilaterally.   No wheezes,  crackles, or rhonchi.  Heart:  Regular rate and rhythm; murmur heard. Abdomen:  Soft, nondistended, nontender. Normal bowel sounds. No appreciable masses or hepatomegaly.  Rectal:  Soft, light  brown stool. Already documented heme positive.   Msk:  Symmetrical without gross deformities.  Extremities:  Without edema. Neurologic:  Alert and  oriented;  grossly normal neurologically. Skin:  Intact without significant lesions or rashes. Cervical Nodes:  No significant cervical adenopathy. Psych:  Alert and cooperative. Normal affect.  LAB RESULTS:  Basename 06/09/11 1525  WBC 4.0  HGB 5.6*  HCT 15.4*  PLT 159   BMET  Basename 06/10/11 0600 06/09/11 1439  NA 141 139  K 4.0 4.4  CL 108 104  CO2 23 22  GLUCOSE 90 93  BUN 52* 60*  CREATININE 2.41* 3.00*  CALCIUM 9.0 9.2   LFT  Basename 06/09/11 1439  PROT 7.1  ALBUMIN 3.6  AST 18  ALT 11  ALKPHOS 55  BILITOT 0.3  BILIDIR --  IBILI --   PT/INR  Basename 06/09/11 1439  LABPROT 14.8  INR 1.14    STUDIES: Dg Chest 2 View  06/09/2011  *RADIOLOGY REPORT*  Clinical Data: Chest pain, weakness, shortness of breath, coughing, hypertension, diabetes.  CHEST - 2 VIEW  Comparison: None.  Findings: Cardiac silhouette is upper normal size.  Slight ectasia of thoracic aorta is present. The patient has undergone previous median sternotomy and coronary artery bypass grafting.  No pulmonary edema, pneumonia, or pleural effusion is evident.  There are osteophytes in the spine.  IMPRESSION: No acute cardiopulmonary or pleural abnormality is evident. Chronic findings detailed above.  Original Report Authenticated By: Delane Ginger, M.D.   US Renal  06/09/2011  *RADIOLOGY REPORT*  Clinical Data: Acute renal failure.  Hypertension.  Diabetes.  RENAL/URINARY TRACT ULTRASOUND COMPLETE  Comparison:  None.  Findings:  Right Kidney:  The right kidney measures 9.5 cm long axis.  Normal central sinus echo complex.  In the interpolar region, there is a 10  mm relatively hypoechoic cystic lesion without definite increased through transmission.  This has some internal echotexture.  Left Kidney:  10.7 cm. Normal echotexture.  Normal central sinus echo complex.  No calculi or hydronephrosis.  Bladder:  Normal urinary bladder.  No debris or trabeculation.  IMPRESSION: 1.  No hydronephrosis or obstruction. 2.  Small hypoechoic lesion in the right interpolar region probably represents a hemorrhagic cyst.  85-month-old ultrasound recommended to ensure stability.  Original Report Authenticated By: Dereck Ligas, M.D.    PREVIOUS ENDOSCOPIES: colonoscopy March 2010. Findings: 50mm sessile polyp at hepatic flexure, colitis, moderate diverticulosis. Polyp not retrieved. Colon path c/w minimally active colitis with melanosis coli.   IMPRESSION / PLAN: 1. Profound macrocytic anemia, macrocytosis is chronic but progressive. Hgb 11.7 one year ago, now 5.6 on admission. He is heme positive, doesn't look at stool to know if there has been any blood. Recent n+v, non-bloody. Rule out PUD in setting of NSAIDS. Rule out AVMs. He does have CKD which could be contributing. Colon neoplasm less likely, he had a complete colonoscopy in March 2010. Patient needs EGD for further evaluation. The benefits, risks, and potential complications of EGD with possible biopsies and/or dilation were discussed with the patient and he agrees to proceed.  2. Progressive macrocytosis. Workup in progress by Hospitalist.  3. CKD, creatinine about baseline 4. History of colon polyps, a small polyp removed but not retrieved on          2010 colonoscpy.  5.  Multiple medical problems.   Thanks   LOS: 1 day   Tye Savoy  06/10/2011, 9:15 AM

## 2011-06-10 NOTE — Op Note (Signed)
[facility] [place], [place]  [id number]  ENDOSCOPY PROCEDURE REPORT  PATIENT:  [name], [name]  MR#:  [id number] BIRTHDATE:  02-Aug-1931, 79 yrs. old  GENDER:  male ENDOSCOPIST:  Norberto Sorenson T. Fuller Plan, MD, [facility]  PROCEDURE DATE:  06/10/2011 PROCEDURE:  EGD with biopsy, [id number] ASA CLASS:  Class III INDICATIONS:  hemoccult positive stool, anemia, nausea and vomiting MEDICATIONS:  Fentanyl 25 mcg IV, Versed 3 mg IV TOPICAL ANESTHETIC:  Cetacaine Spray DESCRIPTION OF PROCEDURE:   After the risks benefits and alternatives of the procedure were thoroughly explained, informed consent was obtained.  The Pentax Gastroscope [id number] endoscope was introduced through the mouth and advanced to the second portion of the duodenum, without limitations.  The instrument was slowly withdrawn as the mucosa was fully examined. <<PROCEDUREIMAGES>> An ulcer was found in the antrum. It was benign appearing and clean based. It was 6 mm in size. Multiple biopsies were obtained and sent to pathology. Atrophic gastric mucosa. Otherwise normal stomach. The esophagus and gastroesophageal junction were completely normal in appearance.  The duodenal bulb was normal in appearance, as was the postbulbar duodenum.  Retroflexed views revealed no abnormalities.  The scope was then withdrawn from the patient and the procedure completed.  COMPLICATIONS:  None  ENDOSCOPIC IMPRESSION: 1) 6 mm ulcer in the antrum 2) Atrophic gastric mucosa  RECOMMENDATIONS: 1) Await pathology results 2) PPI qam 3) Avoid ASA/NSAIDs for 1 week then low dose ASA ok along with a PPI  [name], MD, [name]  CC:  Erskine Emery, MD  n. [name] [name] at 06/10/2011 02:31 PM  [name], [id number]

## 2011-06-10 NOTE — Progress Notes (Signed)
Utilization review completed.  

## 2011-06-11 DIAGNOSIS — N179 Acute kidney failure, unspecified: Secondary | ICD-10-CM

## 2011-06-11 DIAGNOSIS — K250 Acute gastric ulcer with hemorrhage: Secondary | ICD-10-CM

## 2011-06-11 DIAGNOSIS — R195 Other fecal abnormalities: Secondary | ICD-10-CM

## 2011-06-11 DIAGNOSIS — D649 Anemia, unspecified: Secondary | ICD-10-CM

## 2011-06-11 LAB — BASIC METABOLIC PANEL
BUN: 36 mg/dL — ABNORMAL HIGH (ref 6–23)
Calcium: 8.9 mg/dL (ref 8.4–10.5)
Creatinine, Ser: 2 mg/dL — ABNORMAL HIGH (ref 0.50–1.35)
GFR calc non Af Amer: 30 mL/min — ABNORMAL LOW (ref >90)
Glucose, Bld: 97 mg/dL (ref 70–99)

## 2011-06-11 LAB — TYPE AND SCREEN
ABO/RH(D): O POS
Antibody Screen: NEGATIVE
Unit division: 0

## 2011-06-11 LAB — CBC
HCT: 25.3 % — ABNORMAL LOW (ref 39.0–52.0)
Hemoglobin: 8.8 g/dL — ABNORMAL LOW (ref 13.0–17.0)
MCH: 36.1 pg — ABNORMAL HIGH (ref 26.0–34.0)
MCV: 103.7 fL — ABNORMAL HIGH (ref 78.0–100.0)
RBC: 2.44 MIL/uL — ABNORMAL LOW (ref 4.22–5.81)

## 2011-06-11 LAB — HEMOGLOBIN AND HEMATOCRIT, BLOOD
HCT: 26.1 % — ABNORMAL LOW (ref 39.0–52.0)
Hemoglobin: 9.2 g/dL — ABNORMAL LOW (ref 13.0–17.0)

## 2011-06-11 MED ORDER — ASPIRIN 81 MG PO TABS: 81.0000 mg | ORAL_TABLET | Freq: Every day | ORAL | Status: DC

## 2011-06-11 MED ORDER — PANTOPRAZOLE SODIUM 40 MG PO TBEC
40.0000 mg | DELAYED_RELEASE_TABLET | Freq: Every morning | ORAL | Status: DC
  Administered 2011-06-11: 40 mg via ORAL
  Filled 2011-06-11: qty 1

## 2011-06-11 MED ORDER — PANTOPRAZOLE SODIUM 40 MG PO TBEC: 40.0000 mg | DELAYED_RELEASE_TABLET | Freq: Every morning | ORAL | Status: DC

## 2011-06-11 MED ORDER — CYANOCOBALAMIN 1000 MCG/ML IJ SOLN: 1000.0000 ug | Freq: Every day | INTRAMUSCULAR | Status: DC

## 2011-06-11 NOTE — Discharge Instructions (Signed)
Please follow up Endoscopy-Gastric ulcer biopsy results and Intrinsic Factor Antibody, Parietal Cell Antibody results-when you follow up with your Primary Care Practitioner

## 2011-06-11 NOTE — Progress Notes (Signed)
[name] discharged Home per MD order.  Discharge instructions reviewed and discussed with the patient and son, all questions and concerns answered. Copy of instructions and scripts given to patient, copy made and given to son per request.   [place], [place]  Home Medication Instructions [id number]   Printed on:06/11/11 1558  Medication Information                    simvastatin (ZOCOR) 20 MG tablet Take 1 tablet (20 mg total) by mouth at bedtime.           benazepril (LOTENSIN) 40 MG tablet Take 40 mg by mouth daily.           cloNIDine (CATAPRES) 0.1 MG tablet Take 0.1 mg by mouth 2 (two) times daily.           felodipine (PLENDIL) 10 MG 24 hr tablet Take 10 mg by mouth daily.           furosemide (LASIX) 40 MG tablet Take 40 mg by mouth daily.           glipiZIDE (GLUCOTROL) 5 MG tablet Take 5 mg by mouth 2 (two) times daily before a meal.           labetalol (NORMODYNE) 200 MG tablet Take 200 mg by mouth 2 (two) times daily.           Multiple Vitamin (MULTIVITAMIN) tablet Take 1 tablet by mouth daily.           pentoxifylline (TRENTAL) 400 MG CR tablet Take 400 mg by mouth 3 (three) times daily with meals.           simvastatin (ZOCOR) 20 MG tablet Take 20 mg by mouth every evening.           aspirin 81 MG tablet Take 1 tablet (81 mg total) by mouth daily. OK to resume after 1 week-but please discuss with Primary care MD first           cyanocobalamin (,VITAMIN B-12,) 1000 MCG/ML injection Inject 1 mL (1,000 mcg total) into the muscle daily.           pantoprazole (PROTONIX) 40 MG tablet Take 1 tablet (40 mg total) by mouth every morning.             Patients skin is clean, dry and intact, no evidence of skin break down. IV site discontinued and catheter remains intact. Site without signs and symptoms of complications. Dressing and pressure applied.  Patient escorted to car by NT in a wheelchair,  no distress noted upon discharge.  Darreld Mclean [facility] 06/11/2011 3:58 PM

## 2011-06-11 NOTE — Progress Notes (Signed)
[facility] Progress Note  Subjective: Feels better, no BM or GI bleeding, hungry, son in room had several questions.  Objective:  Vital signs in last 24 hours: Temp:  [97.3 F (36.3 C)-98.5 F (36.9 C)] 98.5 F (36.9 C) (04/13 0500) Pulse Rate:  [46-67] 63  (04/13 0500) Resp:  [15-74] 20  (04/13 0500) BP: (93-145)/(34-62) 143/61 mmHg (04/13 0948) SpO2:  [96 %-100 %] 96 % (04/13 0500) Last BM Date: 06/10/11 General:   Alert,  Well-developed,  white male in NAD Heart:  Regular rate and rhythm; no murmurs Abdomen:  Soft, nontender and nondistended. Normal bowel sounds, without guarding, and without rebound.   Extremities:  Without edema. Neurologic:  Alert and  oriented x4;  grossly normal neurologically. Psych:  Alert and cooperative. Normal mood and affect.  Intake/Output from previous day: 04/12 0701 - 04/13 0700 In: -  Out: 600 [Urine:600] Intake/Output this shift: Total I/O In: 480 [P.O.:480] Out: 350 [Urine:350]  Lab Results:  Basename 06/11/11 0600 06/11/11 0119 06/10/11 1631 06/09/11 1525  WBC 2.8* -- -- 4.0  HGB 8.8* 9.2* 7.8* --  HCT 25.3* 26.1* 22.3* --  PLT 122* -- -- 159   BMET  Basename 06/11/11 0600 06/10/11 0600 06/09/11 1439  NA 141 141 139  K 4.2 4.0 4.4  CL 107 108 104  CO2 24 23 22   GLUCOSE 97 90 93  BUN 36* 52* 60*  CREATININE 2.00* 2.41* 3.00*  CALCIUM 8.9 9.0 9.2   LFT  Basename 06/09/11 1439  PROT 7.1  ALBUMIN 3.6  AST 18  ALT 11  ALKPHOS 55  BILITOT 0.3  BILIDIR --  IBILI --   PT/INR  Basename 06/09/11 1439  LABPROT 14.8  INR 1.14   Studies/Results: Dg Chest 2 View  06/09/2011  *RADIOLOGY REPORT*  Clinical Data: Chest pain, weakness, shortness of breath, coughing, hypertension, diabetes.  CHEST - 2 VIEW  Comparison: None.  Findings: Cardiac silhouette is upper normal size.  Slight ectasia of thoracic aorta is present. The patient has undergone previous median sternotomy and coronary artery bypass grafting.   No pulmonary edema, pneumonia, or pleural effusion is evident.  There are osteophytes in the spine.  IMPRESSION: No acute cardiopulmonary or pleural abnormality is evident. Chronic findings detailed above.  Original Report Authenticated By: Delane Ginger, M.D.   US Renal  06/09/2011  *RADIOLOGY REPORT*  Clinical Data: Acute renal failure.  Hypertension.  Diabetes.  RENAL/URINARY TRACT ULTRASOUND COMPLETE  Comparison:  None.  Findings:  Right Kidney:  The right kidney measures 9.5 cm long axis.  Normal central sinus echo complex.  In the interpolar region, there is a 10 mm relatively hypoechoic cystic lesion without definite increased through transmission.  This has some internal echotexture.  Left Kidney:  10.7 cm. Normal echotexture.  Normal central sinus echo complex.  No calculi or hydronephrosis.  Bladder:  Normal urinary bladder.  No debris or trabeculation.  IMPRESSION: 1.  No hydronephrosis or obstruction. 2.  Small hypoechoic lesion in the right interpolar region probably represents a hemorrhagic cyst.  14-month-old ultrasound recommended to ensure stability.  Original Report Authenticated By: Dereck Ligas, M.D.   Assessment / Plan:  1. Profound macrocytic anemia-B12 deficient. R/O pernicious anemia. Check anit-IF and anti-parietal cell antibodies. Hb=8.8 today. 2. Gastric ulcer with bleeding contributing to anemia. No rebleeding noted. Advance diet. Continue PPI PO. Awaiting biopsies. Stable for discharge from GI standpoint. OK to resume low dose ASA if clinically important in 5-7 days. Avoid all other NSAIDs. 3.  CKD, creatinine about baseline  4. History of colon polyps, a small polyp removed but not retrieved on 2010 colonoscpy.  5. Multiple medical problems.   Outpatient GI follow up with Dr. Erskine Emery in 4-6 weeks. We will sign off.  Principal Problem:  *Anemia Active Problems:  Benign neoplasm of colon  DIABETES MELLITUS, TYPE II  HYPERLIPIDEMIA  HYPERTENSION  CORONARY ARTERY  DISEASE  PEPTIC ULCER DISEASE  GI bleed  Acute renal failure (ARF)  Generalized weakness  Nonspecific abnormal finding in stool contents  Acute gastric ulcer with hemorrhage, without mention of obstruction    LOS: 2 days   Daphney Hopke T. Fuller Plan MD [facility] 06/11/2011, 10:23 AM

## 2011-06-11 NOTE — Discharge Summary (Signed)
PATIENT DETAILS Name: [name] Age: 78 y.o. Sex: male Date of Birth: 09/19/1931 MRN: [id number]. Admit Date: 06/09/2011 Admitting Physician: Brandon Osgood, MD [id number] [name], MD, MD  PRIMARY DISCHARGE DIAGNOSIS:  Principal Problem:  *Anemia Active Problems:  Benign neoplasm of colon  DIABETES MELLITUS, TYPE II  HYPERLIPIDEMIA  HYPERTENSION  CORONARY ARTERY DISEASE  PEPTIC ULCER DISEASE  GI bleed  Acute renal failure (ARF)  Generalized weakness  Nonspecific abnormal finding in stool contents  Acute gastric ulcer with hemorrhage, without mention of obstruction      PAST MEDICAL HISTORY: Past Medical History  Diagnosis Date  . CAD (coronary artery disease)   . Diabetes mellitus   . Hyperlipidemia   . Hypertension   . Myocardial infarction   . Renal insufficiency     DISCHARGE MEDICATIONS: Medication List  As of 06/11/2011  2:15 PM   TAKE these medications         aspirin 81 MG tablet   Take 1 tablet (81 mg total) by mouth daily. OK to resume after 1 week-but please discuss with Primary care MD first      benazepril 40 MG tablet   Commonly known as: LOTENSIN   Take 40 mg by mouth daily.      cloNIDine 0.1 MG tablet   Commonly known as: CATAPRES   Take 0.1 mg by mouth 2 (two) times daily.      cyanocobalamin 1000 MCG/ML injection   Commonly known as: (VITAMIN B-12)   Inject 1 mL (1,000 mcg total) into the muscle daily.      felodipine 10 MG 24 hr tablet   Commonly known as: PLENDIL   Take 10 mg by mouth daily.      furosemide 40 MG tablet   Commonly known as: LASIX   Take 40 mg by mouth daily.      glipiZIDE 5 MG tablet   Commonly known as: GLUCOTROL   Take 5 mg by mouth 2 (two) times daily before a meal.      labetalol 200 MG tablet   Commonly known as: NORMODYNE   Take 200 mg by mouth 2 (two) times daily.      multivitamin tablet   Take 1 tablet by mouth daily.      pantoprazole 40 MG tablet   Commonly known as:  PROTONIX   Take 1 tablet (40 mg total) by mouth every morning.      pentoxifylline 400 MG CR tablet   Commonly known as: TRENTAL   Take 400 mg by mouth 3 (three) times daily with meals.      simvastatin 20 MG tablet   Commonly known as: ZOCOR   Take 20 mg by mouth every evening.      simvastatin 20 MG tablet   Commonly known as: ZOCOR   Take 1 tablet (20 mg total) by mouth at bedtime.             BRIEF HPI:  See H&P, Labs, Consult and Test reports for all details in brief, Patient is a 78 year old male with history of coronary disease, diabetes, hyperlipidemia, hypertension, CKD presented to [facility] ED with symptoms of worsening generalized weakness for last 1 or 2 weeks.   CONSULTATIONS:   GI  PERTINENT RADIOLOGIC STUDIES: Dg Chest 2 View  06/09/2011  *RADIOLOGY REPORT*  Clinical Data: Chest pain, weakness, shortness of breath, coughing, hypertension, diabetes.  CHEST - 2 VIEW  Comparison: None.  Findings: Cardiac silhouette is upper normal size.  Slight ectasia  of thoracic aorta is present. The patient has undergone previous median sternotomy and coronary artery bypass grafting.  No pulmonary edema, pneumonia, or pleural effusion is evident.  There are osteophytes in the spine.  IMPRESSION: No acute cardiopulmonary or pleural abnormality is evident. Chronic findings detailed above.  Original Report Authenticated By: Delane Ginger, M.D.   US Renal  06/09/2011  *RADIOLOGY REPORT*  Clinical Data: Acute renal failure.  Hypertension.  Diabetes.  RENAL/URINARY TRACT ULTRASOUND COMPLETE  Comparison:  None.  Findings:  Right Kidney:  The right kidney measures 9.5 cm long axis.  Normal central sinus echo complex.  In the interpolar region, there is a 10 mm relatively hypoechoic cystic lesion without definite increased through transmission.  This has some internal echotexture.  Left Kidney:  10.7 cm. Normal echotexture.  Normal central sinus echo complex.  No calculi or hydronephrosis.  Bladder:   Normal urinary bladder.  No debris or trabeculation.  IMPRESSION: 1.  No hydronephrosis or obstruction. 2.  Small hypoechoic lesion in the right interpolar region probably represents a hemorrhagic cyst.  11-month-old ultrasound recommended to ensure stability.  Original Report Authenticated By: Dereck Ligas, M.D.     PERTINENT LAB RESULTS: CBC:  Basename 06/11/11 0600 06/11/11 0119 06/09/11 1525  WBC 2.8* -- 4.0  HGB 8.8* 9.2* --  HCT 25.3* 26.1* --  PLT 122* -- 159   CMET CMP     Component Value Date/Time   NA 141 06/11/2011 0600   K 4.2 06/11/2011 0600   CL 107 06/11/2011 0600   CO2 24 06/11/2011 0600   GLUCOSE 97 06/11/2011 0600   GLUCOSE 88 12/30/2005 1538   BUN 36* 06/11/2011 0600   CREATININE 2.00* 06/11/2011 0600   CALCIUM 8.9 06/11/2011 0600   PROT 7.1 06/09/2011 1439   ALBUMIN 3.6 06/09/2011 1439   AST 18 06/09/2011 1439   ALT 11 06/09/2011 1439   ALKPHOS 55 06/09/2011 1439   BILITOT 0.3 06/09/2011 1439   GFRNONAA 30* 06/11/2011 0600   GFRAA 35* 06/11/2011 0600    GFR Estimated Creatinine Clearance: 32.7 ml/min (by C-G formula based on Cr of 2). No results found for this basename: LIPASE:2,AMYLASE:2 in the last 72 hours  Basename 06/09/11 1439  CKTOTAL --  CKMB --  CKMBINDEX --  TROPONINI <0.30   No components found with this basename: POCBNP:3 No results found for this basename: DDIMER:2 in the last 72 hours  Basename 06/10/11 0600  HGBA1C 5.8*   No results found for this basename: CHOL:2,HDL:2,LDLCALC:2,TRIG:2,CHOLHDL:2,LDLDIRECT:2 in the last 72 hours No results found for this basename: TSH,T4TOTAL,FREET3,T3FREE,THYROIDAB in the last 72 hours  Basename 06/10/11 1220 06/10/11 0600  VITAMINB12 48* 48*  FOLATE -- >20.0  FERRITIN -- 177  TIBC -- 198*  IRON -- 67  RETICCTPCT -- 1.0   Coags:  Basename 06/09/11 1439  INR 1.14   Microbiology: No results found for this or any previous visit (from the past 240 hour(s)).   BRIEF HOSPITAL COURSE:    Anemia- Secondary to recent blood loss-likely UGI Bleed, S/P 3units of PRBC transfusion Also some contribution to anemia from severe Vit B12 deficiency Hb on admission was 5.6, discharge Hb is 8.8  UGI Bleed -likely recent, did not have any overt bleeding during his hospital stay here -Secondary to Gastric Ulcer seen on EGD -continue with daily  PPI on discharge -Biopsy from Ulcer still pending, will need follow up  Vit B12 Deficiency -likely secondary to pernicious anemia -have sent out IF and parietal cell antibody -will  need to get further Vit B12 shots at PCP's office, patient and son aware of this, they will go to PCP's office on Monday -While hospitalized, patient did get 2 I.M injections of Vit B12  Acute on Chronic Renal Failure. -pre-renal, likely secondary to UGI bleed -resolved with hydration, creatinine back to baseline at discharge  DM.  CBGs controlled -continue with Glipizide on discharge  HTN -Stable -continue with prior anti-hypertensive's on discharge  CAD -ok to resume ASA in 1 week time, have asked patient to discuss with PCP prior to discharge   TODAY-DAY OF DISCHARGE:  Subjective:   [name] today has no headache,no chest abdominal pain,no new weakness tingling or numbness, feels much better wants to go home today. He has tolerated a regular diet.  Objective:   Blood pressure 143/61, pulse 63, temperature 98.5 F (36.9 C), temperature source Oral, resp. rate 20, height 5\' 11"  (1.803 m), weight 74.844 kg (165 lb), SpO2 96.00%.  Intake/Output Summary (Last 24 hours) at 06/11/11 1415 Last data filed at 06/11/11 0900  Gross per 24 hour  Intake    480 ml  Output    950 ml  Net   -470 ml    Exam Awake Alert, Oriented *3, No new F.N deficits, Normal affect [place].AT,PERRAL Supple Neck,No JVD, No cervical lymphadenopathy appriciated.  Symmetrical Chest wall movement, Good air movement bilaterally, CTAB RRR,No Gallops,Rubs or new Murmurs,  No Parasternal Heave +ve B.Sounds, Abd Soft, Non tender, No organomegaly appriciated, No rebound -guarding or rigidity. No Cyanosis, Clubbing or edema, No new Rash or bruise  DISPOSITION: Home   DISCHARGE INSTRUCTIONS:     Please follow up EGD biopsy and IF Antibody and Parietal Cell Antibody when you follow up with your Primary care MD  Follow-up Information    Follow up with [name], MD. Schedule an appointment as soon as possible for a visit in 3 days.      Follow up with Erskine Emery, MD. Schedule an appointment as soon as possible for a visit in 4 weeks.   Contact information:   [address] [facility] [place] [phone]          Total Time spent on discharge equals 45 minutes.  SignedOren Binet 06/11/2011 2:15 PM

## 2011-06-13 ENCOUNTER — Encounter: Payer: Self-pay | Admitting: Gastroenterology

## 2011-06-13 ENCOUNTER — Ambulatory Visit (INDEPENDENT_AMBULATORY_CARE_PROVIDER_SITE_OTHER): Payer: PRIVATE HEALTH INSURANCE | Admitting: Family

## 2011-06-13 ENCOUNTER — Encounter (HOSPITAL_COMMUNITY): Payer: Self-pay | Admitting: Gastroenterology

## 2011-06-13 VITALS — BP 140/70 | Temp 98.4°F | Wt 155.0 lb

## 2011-06-13 DIAGNOSIS — D649 Anemia, unspecified: Secondary | ICD-10-CM

## 2011-06-13 DIAGNOSIS — K264 Chronic or unspecified duodenal ulcer with hemorrhage: Secondary | ICD-10-CM

## 2011-06-13 LAB — CBC
HCT: 26.8 % — ABNORMAL LOW (ref 39.0–52.0)
Hemoglobin: 9.1 g/dL — ABNORMAL LOW (ref 13.0–17.0)
MCHC: 33.1 g/dL (ref 30.0–36.0)
MCV: 109.7 fl — ABNORMAL HIGH (ref 78.0–100.0)
Platelets: 147 10*3/uL — ABNORMAL LOW (ref 150.0–400.0)

## 2011-06-13 MED ORDER — CYANOCOBALAMIN 1000 MCG/ML IJ SOLN
1000.0000 ug | Freq: Once | INTRAMUSCULAR | Status: AC
  Administered 2011-06-13: 1000 ug via INTRAMUSCULAR

## 2011-06-13 NOTE — Progress Notes (Signed)
   CARE MANAGEMENT NOTE 06/13/2011  Patient:  [name], [name]   Account Number:  [id number]  Date Initiated:  06/13/2011  Documentation initiated by:  Tomi Bamberger  Subjective/Objective Assessment:   dx gib  admit- lives alone     Action/Plan:   Anticipated DC Date:  06/11/2011   Anticipated DC Plan:  [place]  CM consult      Choice offered to / List presented to:             Status of service:  Completed, signed off Medicare Important Message given?   (If response is "NO", the following Medicare IM given date fields will be blank) Date Medicare IM given:   Date Additional Medicare IM given:    Discharge Disposition:  HOME/SELF CARE  Per UR Regulation:    If discussed at Long Length of Stay Meetings, dates discussed:    Comments:  06/13/11 8:26 Tomi Bamberger RN, BSN [phone] patient dc to home, independent.

## 2011-06-13 NOTE — Progress Notes (Signed)
Subjective:    Patient ID: [name], male    DOB: 05-Oct-1931, 78 y.o.   MRN: [id number]  HPI Comments: 78 yo black male presents for follow up visit from the hospital. Was found to have a hemoglobin of 5.6.  Was hospitalized after EGD results revealed ulcer in antrum of stomach. Received prbcs while in hospital and was discharged Sat. Has supportive daughter who will be giving b12 injections. Denies shortness of breath, dizziness,  cp, weakness, blood in stool or urine, or fatigue.      Review of Systems  Constitutional: Negative.   Eyes: Negative.   Respiratory: Negative.   Cardiovascular: Negative.   Gastrointestinal: Negative.   Genitourinary: Negative.   Neurological: Negative.    Past Medical History  Diagnosis Date  . CAD (coronary artery disease)   . Diabetes mellitus   . Hyperlipidemia   . Hypertension   . Myocardial infarction   . Renal insufficiency     History   Social History  . Marital Status: Widowed    Spouse Name: N/A    Number of Children: N/A  . Years of Education: N/A   Occupational History  . Not on file.   Social History Main Topics  . Smoking status: Former Smoker    Quit date: 02/28/1985  . Smokeless tobacco: Never Used  . Alcohol Use: No  . Drug Use: No  . Sexually Active: Not Currently   Other Topics Concern  . Not on file   Social History Narrative  . No narrative on file    Past Surgical History  Procedure Date  . Coronary artery bypass graft 2003  . Esophagogastroduodenoscopy 06/10/2011    Procedure: ESOPHAGOGASTRODUODENOSCOPY (EGD);  Surgeon: Ladene Artist, MD,FACG;  Location: [facility] ENDOSCOPY;  Service: Endoscopy;  Laterality: N/A;    Family History  Problem Relation Age of Onset  . Diabetes      brothers and sisters  . Hypertension    . Coronary artery disease    . Stroke Mother   . Hypertension Mother   . Cancer Mother     No Known Allergies  Current Outpatient Prescriptions on File Prior to Visit    Medication Sig Dispense Refill  . benazepril (LOTENSIN) 40 MG tablet Take 40 mg by mouth daily.      . cloNIDine (CATAPRES) 0.1 MG tablet Take 0.1 mg by mouth 2 (two) times daily.      . furosemide (LASIX) 40 MG tablet Take 40 mg by mouth daily.      Marland Kitchen glipiZIDE (GLUCOTROL) 5 MG tablet Take 5 mg by mouth 2 (two) times daily before a meal.      . labetalol (NORMODYNE) 200 MG tablet Take 200 mg by mouth 2 (two) times daily.      . Multiple Vitamin (MULTIVITAMIN) tablet Take 1 tablet by mouth daily.      . pantoprazole (PROTONIX) 40 MG tablet Take 1 tablet (40 mg total) by mouth every morning.  30 tablet  0  . pentoxifylline (TRENTAL) 400 MG CR tablet Take 400 mg by mouth 3 (three) times daily with meals.      . simvastatin (ZOCOR) 20 MG tablet Take 1 tablet (20 mg total) by mouth at bedtime.  90 tablet  6  . aspirin 81 MG tablet Take 1 tablet (81 mg total) by mouth daily. OK to resume after 1 week-but please discuss with Primary care MD first      . cyanocobalamin (,VITAMIN B-12,) 1000 MCG/ML injection Inject 1  mL (1,000 mcg total) into the muscle daily.  10 mL  0  . felodipine (PLENDIL) 10 MG 24 hr tablet Take 10 mg by mouth daily.      . simvastatin (ZOCOR) 20 MG tablet Take 20 mg by mouth every evening.       No current facility-administered medications on file prior to visit.    BP 140/70  Temp(Src) 98.4 F (36.9 C) (Oral)  Wt 155 lb (70.308 kg)chart    Objective:   Physical Exam  Constitutional: He is oriented to person, place, and time. He appears well-developed and well-nourished. No distress.  Eyes: Conjunctivae are normal. Pupils are equal, round, and reactive to light. Right eye exhibits no discharge. Left eye exhibits no discharge.  Cardiovascular: Normal rate, regular rhythm, normal heart sounds and intact distal pulses.  Exam reveals no gallop and no friction rub.   No murmur heard. Pulmonary/Chest: Effort normal and breath sounds normal. No respiratory distress. He has no  wheezes. He has no rales. He exhibits no tenderness.  Abdominal: Soft. Bowel sounds are normal. He exhibits no distension and no mass. There is no tenderness. There is no rebound and no guarding.  Neurological: He is alert and oriented to person, place, and time.  Skin: Skin is warm and dry. He is not diaphoretic.          Assessment & Plan:  Assessment: Pernicious anemia, gastric ulcer Plan: Labs: CBC, teaching handouts on diagnosis and treatment provided, B12 injection, instruction provided for daughter how to give injection and return demonstration provided, encouraged to RTC if feel fatigued, short of breath, or other abnormal s/s.

## 2011-06-13 NOTE — Patient Instructions (Signed)
Ulcers of the Gastrointestinal Tract You have an ulcer or are likely to get ulcers more often than most people. An ulcer is a break or hole in the lining of the esophagus (food tube from the mouth to the stomach), stomach, or the first part of the small bowel. CAUSES   Germs (bacteria). There is a bacterium related to ulcers called Helicobacter Pylori.   Medications such as the nonsteroidal anti-inflammatory medications.   Cigarette smoking is related to ulcers and it does not help them heal.  SYMPTOMS   Burning or gnawing of the mid upper belly (abdomen). This is usually relieved with food or antacids.   Feeling sick to your stomach (nausea).   Bloating.   Vomiting.   If the ulcer results in bleeding, it can cause:   Black tarry stools.   Vomiting of bright red blood.   With severe bleeding, there may be:   Loss of consciousness and shock.   Vomiting coffee ground looking materials.  DIAGNOSIS  Learning what is wrong (diagnosis) is usually made with x-rays (barium studies) and upper GI (gastrointestinal) endoscopy. With endoscopy, a flexible tube is used to look at the esophagus, stomach, and small bowel. Abnormal areas may be biopsied. This is when a small piece of tissue is removed to look at under a microscope. In young people, it is safe to treat without studies (barium x-rays, for example) and to use diagnostic studies on those who do not respond. TREATMENT   Avoid tobacco, alcohol, and foods that seem to make your pain worse. Tobacco use will slow the healing process.   Take other medications as directed. Your caregiver may prescribe medications known as H2 blockers that cut down on the production of acid. Other medications are available that protect the lining of the bowel.   Continue regular work and usual activities unless told otherwise by your caregiver.   If you failed to respond to the usual ulcer treatments, ask your caregiver if antibiotics are a consideration.  These are medications that kill germs.  SEEK IMMEDIATE MEDICAL CARE IF:  You develop:  Bright red bleeding.   Vomit blood.   Become light-headed, weak.   Have fainting episodes.   Become sweaty, cold and clammy.   You have severe abdominal pain not controlled by medications. Do not take pain medications unless told by your caregiver.  Document Released: 07/07/2000 Document Revised: 02/03/2011 Document Reviewed: 11/24/2004 [facility] Patient Information 2012 [place].  Pernicious Anemia Pernicious anemia may be an immune system illness. It causes the production of antibodies to cells of the stomach (parietal cells), and proteins produced by the stomach, which are needed to absorb vitamin B12. The result of this illness is the body does not absorb enough B12 from the diet. This leads to lessened red blood cell production which causes anemia. Vitamin B12 is needed for making red blood cells and keeping the nervous system healthy. Not enough vitamin B12 in your system slowly affects sensory and motor nerves. This causes problems with your nervous system (neurological) to develop over time. Neurological effects of vitamin B12 deficiency may be seen before anemia is diagnosed. This affects both men and women, between ages 28 and 62. The anemia also affects the bowel, the heart and vascular systems and cannot be prevented. CAUSES  Pernicious anemia is due to a lack of substance called intrinsic factor. This is a substance made by cells in the stomach. It makes it possible to absorb vitamin B12. The reason for the lack  of this substance is unknown but it may be autoimmune, genetic, or both. SYMPTOMS  The following problems may be seen with this illness:  Problems develop slowly.   Rapid heart rate.   Nausea, appetite loss, and weight loss.   Difficulty maintaining proper balance.   Yellow eyes and skin.   Loss of deep tendon reflexes.   Depression.   Confusion, poor memory, and  dementia.   Ringing in the ears (tinnitus).   Weakness, especially in the arms and legs.   Sore tongue.   Numbness or tingling in the hands and feet.   Pale lips, tongue, and gums.   Bleeding gums.   Shortness of breath.   Headache.   Fatigue.  RISK OF VITAMIN B12 DEFICIENCY INCREASES WITH:  Diseases or surgery affecting the stomach.   Diabetes and autoimmune disorders. Autoimmune disorders are diseases where the body makes antibodies which attack your own body tissues.   Thyroid disorders.   Genetic factors, such as in people of Northern European ancestry. It is rare in African Americans and Asians.   Family history of pernicious anemia.   Age over 87.   Strict vegetarian diet or infants breast-fed by a mother on a strict vegetarian diet.   Lack of stomach acid in older adults.   Parasitic infections and intestinal diseases.   Drugs such as H2 blockers, proton pump inhibitors, colchicine, neomycin, and aminosalicylic acid.   Alcoholism.  PREVENTION  Pernicious anemia cannot be prevented but vitamin B12 deficiencies can be prevented.   For pernicious anemia, lifelong vitamin B12 therapy will help symptoms and prevent complications.   Dietary changes can prevent deficiency. B12 is mostly from animal sources so a deficiency is more likely in a vegetarian who does not eat eggs or dairy products.  RELATED COMPLICATIONS  Heart failure.   Nerve damage that can not be reversed.   Gastric cancer.  DIAGNOSIS  Your caregiver can determine what is wrong by:  Doing blood tests for vitamin B12 levels.   Checking for antibodies to the intrinsic factor.   Measuring the body's ability to absorb vitamin B12.  TREATMENT   Life long treatment usually involves vitamin B12 replacement. Monthly vitamin B12 injections are the treatment of choice to correct vitamin B12 deficiency and may be given by the patient. This therapy corrects the anemia and it may correct the  neurological complications if given early enough. About 1% of vitamin B12 is absorbed (even in the absence of intrinsic factor) so some caregivers recommend that elderly patients with gastric atrophy take oral vitamin B12 supplements in addition to monthly injections.   Some symptoms should start to clear up in a few days after treatment begins but other symptoms may take several months.   Additionally, other conditions which may lead to a deficiency should be treated.   Stop drinking alcohol if alcoholism led to the vitamin B12 deficiency.   For other patients, the vitamin may be taken by mouth or as a nasal gel (or in addition to injections).   Iron supplements may be prescribed.   Avoid taking high amounts of folic acid. It can mask the signs of vitamin B12 deficiency.   Activity may be limited until symptoms improve.   Eat a well-balanced diet.   People on strict vegetarian diets can change their diet or take vitamin B12 supplements for life.  PROGNOSIS   When caught early, the prognosis is good. Most people will do well.   Patients with this illness have a  higher incidence of cancer and polyps of the stomach.   Nervous system problems may not improve if treatment does not start soon enough.  Document Released: 05/07/2003 Document Revised: 02/03/2011 Document Reviewed: 02/12/2008 [facility] Patient Information 2012 [place].

## 2011-06-14 ENCOUNTER — Telehealth: Payer: Self-pay | Admitting: Family Medicine

## 2011-06-14 LAB — PROTEIN ELECTROPHORESIS, SERUM
Albumin ELP: 57.9 % (ref 55.8–66.1)
Alpha-1-Globulin: 9.3 % — ABNORMAL HIGH (ref 2.9–4.9)
Alpha-2-Globulin: 8.9 % (ref 7.1–11.8)
Beta 2: 4.7 % (ref 3.2–6.5)
Beta Globulin: 5.4 % (ref 4.7–7.2)
Gamma Globulin: 13.8 % (ref 11.1–18.8)

## 2011-06-14 NOTE — Telephone Encounter (Signed)
Pt called and wants to know if he still needs to be taking Felodipine?

## 2011-06-14 NOTE — Telephone Encounter (Signed)
Padonda saw pt - please assist pt with letter for when he will be able to return to work

## 2011-06-14 NOTE — Telephone Encounter (Signed)
Pt was here yesterday for post hosp. He wants to go back to work on Monday, and is requesting a doctor's note for the time he's been off. Please call him when ready for pick up. Thanks.

## 2011-06-15 ENCOUNTER — Telehealth: Payer: Self-pay | Admitting: Gastroenterology

## 2011-06-15 NOTE — Telephone Encounter (Signed)
Per Padonda, pt should call GI to see if he can be cleared to return to work.  Spoke with pt. He states that he does not see Dr. Deatra Ina until May. Please advise on return to work date. Pt would like to go on Monday.  Pt also asked if the hospital had taken him off PLENDIL. I did not see anything in the note about it being d/c. If it has not been d/c, pt needs a refill. Please advise

## 2011-06-15 NOTE — Telephone Encounter (Signed)
Pt was just discharged from the hospital and he wants to know if he can go back to work on Monday 06/20/11. Please advise.

## 2011-06-15 NOTE — Telephone Encounter (Signed)
He was seen by Dr. Megan Salon on June 13, 2011. I have not seen him in 2 years. This question should be referred to Dr. Megan Salon

## 2011-06-15 NOTE — Telephone Encounter (Signed)
Left message for pt to call GI per Padonda's note

## 2011-06-15 NOTE — Telephone Encounter (Signed)
As previously discussed, have patient call GI and leave a message for the physician to make a decision. I am ok if he goes back but I think it is important for GI to make the final determination/

## 2011-06-16 ENCOUNTER — Ambulatory Visit: Payer: PRIVATE HEALTH INSURANCE | Admitting: Family

## 2011-06-16 ENCOUNTER — Other Ambulatory Visit: Payer: Self-pay

## 2011-06-16 MED ORDER — FELODIPINE ER 10 MG PO TB24: 10.0000 mg | ORAL_TABLET | Freq: Every day | ORAL | Status: DC

## 2011-06-16 NOTE — Telephone Encounter (Signed)
Spoke with pt and he is aware. Pt was given the phone number to [place].

## 2011-06-18 ENCOUNTER — Other Ambulatory Visit: Payer: Self-pay | Admitting: Internal Medicine

## 2011-06-20 ENCOUNTER — Telehealth: Payer: Self-pay | Admitting: Internal Medicine

## 2011-06-20 NOTE — Telephone Encounter (Signed)
Please schedule posthospital discharge return office visit.  Maintain off aspirin at this time;  will consider resuming in 6 weeks

## 2011-06-20 NOTE — Telephone Encounter (Signed)
Please advise - has cpx with [place] on 07/15/11 but also has appt with dr. Deatra Ina (post hospital ) the same day

## 2011-06-20 NOTE — Telephone Encounter (Signed)
Spoke with pt- informed to stay off asa for now - call and schedule post hospital/cpx in the next couple of weeks- ok to cx 5/17 cpx - will see sooner

## 2011-06-20 NOTE — Telephone Encounter (Signed)
Pt was in hosp about a wk and a half ago. Pt was taken off aspirin in hosp and needs to know when he needs to go back on aspirin? Pls call back and leave vm if pt not avail.

## 2011-06-22 DIAGNOSIS — Z0279 Encounter for issue of other medical certificate: Secondary | ICD-10-CM

## 2011-06-23 ENCOUNTER — Telehealth: Payer: Self-pay | Admitting: Internal Medicine

## 2011-06-23 ENCOUNTER — Telehealth: Payer: Self-pay | Admitting: Gastroenterology

## 2011-06-23 NOTE — Telephone Encounter (Signed)
Looks like Dr. Evalee Mutton Ghimire rx's 4/13 r/t lab done 4/12 - B12 - 48 Will we start rx'ing?or refer back to GI?

## 2011-06-23 NOTE — Telephone Encounter (Signed)
Patient calling to see if he needs B 12. Spoke with patient and told him per his d/c note he was to see his PCP on the Monday after his discharge for B 12.  Patient not sure about this but he will call his PCP. (Dr. Deatra Ina has not seen patient in 2 years has OV on 07/15/11)

## 2011-06-23 NOTE — Telephone Encounter (Signed)
Pt was given b12 injections  a totally on 12 injections. Pt does not have any more b12 left. Pt is not sure whether hospital gave him b12 injection. Pharm [facility]. Pt does not know whether he needs refill

## 2011-06-23 NOTE — Telephone Encounter (Signed)
Please RF

## 2011-06-23 NOTE — Telephone Encounter (Signed)
Attempt to call - VM - left instructions to call GI to f/u - very important. They rx'd med and should do f/u

## 2011-06-27 ENCOUNTER — Encounter: Payer: Self-pay | Admitting: Internal Medicine

## 2011-06-27 ENCOUNTER — Ambulatory Visit (INDEPENDENT_AMBULATORY_CARE_PROVIDER_SITE_OTHER): Payer: PRIVATE HEALTH INSURANCE | Admitting: Internal Medicine

## 2011-06-27 VITALS — BP 100/58 | Temp 97.9°F | Wt 162.0 lb

## 2011-06-27 DIAGNOSIS — E538 Deficiency of other specified B group vitamins: Secondary | ICD-10-CM

## 2011-06-27 DIAGNOSIS — E119 Type 2 diabetes mellitus without complications: Secondary | ICD-10-CM

## 2011-06-27 DIAGNOSIS — K250 Acute gastric ulcer with hemorrhage: Secondary | ICD-10-CM

## 2011-06-27 DIAGNOSIS — I10 Essential (primary) hypertension: Secondary | ICD-10-CM

## 2011-06-27 DIAGNOSIS — D649 Anemia, unspecified: Secondary | ICD-10-CM

## 2011-06-27 MED ORDER — CYANOCOBALAMIN 1000 MCG/ML IJ SOLN: 1000.0000 ug | INTRAMUSCULAR | Status: DC

## 2011-06-27 MED ORDER — "SYRINGE/NEEDLE (DISP) 25G X 1"" 3 ML MISC": 1.0000 | Status: DC

## 2011-06-27 NOTE — Patient Instructions (Signed)
Vitamin B12 shots once monthly   Please check your hemoglobin A1c every 3 months  Return in 1 months for follow-up

## 2011-06-27 NOTE — Progress Notes (Signed)
  Subjective:    Patient ID: [name], male    DOB: Sep 14, 1931, 78 y.o.   MRN: [id number]  HPI  78 year old patient who is seen following hospital discharge. He was admitted for evaluation and treatment of acute blood loss anemia secondary to a bleeding gastric ulcer. He received 3 units of packed RBCs. He was also noted to have severe B12 deficiency and has been receiving daily B12 injections. Today he feels much improved he has diabetes coronary artery disease and a history of chronic kidney disease.    Review of Systems  Constitutional: Negative for fever, chills, appetite change and fatigue.  HENT: Negative for hearing loss, ear pain, congestion, sore throat, trouble swallowing, neck stiffness, dental problem, voice change and tinnitus.   Eyes: Negative for pain, discharge and visual disturbance.  Respiratory: Negative for cough, chest tightness, wheezing and stridor.   Cardiovascular: Negative for chest pain, palpitations and leg swelling.  Gastrointestinal: Negative for nausea, vomiting, abdominal pain, diarrhea, constipation, blood in stool and abdominal distention.  Genitourinary: Negative for urgency, hematuria, flank pain, discharge, difficulty urinating and genital sores.  Musculoskeletal: Negative for myalgias, back pain, joint swelling, arthralgias and gait problem.  Skin: Negative for rash.  Neurological: Positive for weakness (much improved). Negative for dizziness, syncope, speech difficulty, numbness and headaches.  Hematological: Negative for adenopathy. Does not bruise/bleed easily.  Psychiatric/Behavioral: Negative for behavioral problems and dysphoric mood. The patient is not nervous/anxious.        Objective:   Physical Exam  Constitutional: He is oriented to person, place, and time. He appears well-developed.  HENT:  Head: Normocephalic.  Right Ear: External ear normal.  Left Ear: External ear normal.  Eyes: Conjunctivae and EOM are normal.  Neck:  Normal range of motion.  Cardiovascular: Normal rate and normal heart sounds.   Murmur: systolic heart murmur heard last visit largely resolved. Pulmonary/Chest: Breath sounds normal.  Abdominal: Bowel sounds are normal.  Musculoskeletal: Normal range of motion. He exhibits no edema and no tenderness.  Neurological: He is alert and oriented to person, place, and time.  Psychiatric: He has a normal mood and affect. His behavior is normal.          Assessment & Plan:    Anemia secondary to gastric ulcer. We'll continue PPI therapy and continue to hold aspirin therapy for at least one more month Severe B12 deficiency. We'll continue monthly B12 shots indefinitely Diabetes mellitus stable Hypertension stable  We'll check a CBC Resume vitamin B12 injections Recheck one month

## 2011-06-28 LAB — CBC WITH DIFFERENTIAL/PLATELET
Basophils Absolute: 0 10*3/uL (ref 0.0–0.1)
Eosinophils Absolute: 0.4 10*3/uL (ref 0.0–0.7)
Lymphocytes Relative: 17.3 % (ref 12.0–46.0)
MCHC: 33 g/dL (ref 30.0–36.0)
Neutro Abs: 4.1 10*3/uL (ref 1.4–7.7)
Neutrophils Relative %: 68.1 % (ref 43.0–77.0)
Platelets: 412 10*3/uL — ABNORMAL HIGH (ref 150.0–400.0)
RDW: 30.6 % — ABNORMAL HIGH (ref 11.5–14.6)

## 2011-07-04 ENCOUNTER — Other Ambulatory Visit (INDEPENDENT_AMBULATORY_CARE_PROVIDER_SITE_OTHER): Payer: PRIVATE HEALTH INSURANCE

## 2011-07-04 DIAGNOSIS — Z0000 Encounter for general adult medical examination without abnormal findings: Secondary | ICD-10-CM

## 2011-07-04 LAB — BASIC METABOLIC PANEL
CO2: 27 mEq/L (ref 19–32)
Chloride: 106 mEq/L (ref 96–112)
Creatinine, Ser: 2.4 mg/dL — ABNORMAL HIGH (ref 0.4–1.5)

## 2011-07-04 LAB — CBC WITH DIFFERENTIAL/PLATELET
Basophils Relative: 0.3 % (ref 0.0–3.0)
Eosinophils Absolute: 0.5 10*3/uL (ref 0.0–0.7)
HCT: 29.4 % — ABNORMAL LOW (ref 39.0–52.0)
Hemoglobin: 9.7 g/dL — ABNORMAL LOW (ref 13.0–17.0)
MCHC: 33 g/dL (ref 30.0–36.0)
MCV: 104.5 fl — ABNORMAL HIGH (ref 78.0–100.0)
Monocytes Absolute: 0.5 10*3/uL (ref 0.1–1.0)
Neutro Abs: 7.7 10*3/uL (ref 1.4–7.7)
RBC: 2.81 Mil/uL — ABNORMAL LOW (ref 4.22–5.81)

## 2011-07-04 LAB — LIPID PANEL
HDL: 32.4 mg/dL — ABNORMAL LOW (ref >39.00)
LDL Cholesterol: 77 mg/dL (ref 0–99)
Total CHOL/HDL Ratio: 4
Triglycerides: 141 mg/dL (ref 0.0–149.0)

## 2011-07-04 LAB — POCT URINALYSIS DIPSTICK
Blood, UA: NEGATIVE
Glucose, UA: NEGATIVE
Nitrite, UA: NEGATIVE
Urobilinogen, UA: 1
pH, UA: 6

## 2011-07-04 LAB — HEPATIC FUNCTION PANEL
Albumin: 4 g/dL (ref 3.5–5.2)
Alkaline Phosphatase: 60 U/L (ref 39–117)
Bilirubin, Direct: 0 mg/dL (ref 0.0–0.3)

## 2011-07-08 ENCOUNTER — Other Ambulatory Visit: Payer: Medicare Other

## 2011-07-11 ENCOUNTER — Encounter: Payer: Self-pay | Admitting: Internal Medicine

## 2011-07-11 ENCOUNTER — Ambulatory Visit (INDEPENDENT_AMBULATORY_CARE_PROVIDER_SITE_OTHER): Payer: Medicare Other | Admitting: Internal Medicine

## 2011-07-11 VITALS — BP 106/74 | HR 70 | Temp 97.9°F | Resp 18 | Ht 69.5 in | Wt 171.0 lb

## 2011-07-11 DIAGNOSIS — K279 Peptic ulcer, site unspecified, unspecified as acute or chronic, without hemorrhage or perforation: Secondary | ICD-10-CM

## 2011-07-11 DIAGNOSIS — I10 Essential (primary) hypertension: Secondary | ICD-10-CM

## 2011-07-11 DIAGNOSIS — E785 Hyperlipidemia, unspecified: Secondary | ICD-10-CM

## 2011-07-11 DIAGNOSIS — N259 Disorder resulting from impaired renal tubular function, unspecified: Secondary | ICD-10-CM

## 2011-07-11 DIAGNOSIS — K922 Gastrointestinal hemorrhage, unspecified: Secondary | ICD-10-CM

## 2011-07-11 DIAGNOSIS — I739 Peripheral vascular disease, unspecified: Secondary | ICD-10-CM

## 2011-07-11 DIAGNOSIS — E538 Deficiency of other specified B group vitamins: Secondary | ICD-10-CM

## 2011-07-11 DIAGNOSIS — E119 Type 2 diabetes mellitus without complications: Secondary | ICD-10-CM

## 2011-07-11 MED ORDER — GLIPIZIDE 5 MG PO TABS: ORAL_TABLET | ORAL | Status: DC

## 2011-07-11 NOTE — Progress Notes (Signed)
Subjective:    Patient ID: [name], male    DOB: [birth year], 78 y.o.   MRN: [id number]  HPI 78 year old patient who is seen today for followup. He was hospitalized last month for her acute GI bleed secondary to gastric ulcer disease. He is scheduled for GI followup soon. He has type 2 diabetes and recent hemoglobin A1c was 5.7. He denies any frank hypoglycemia History of hypertension coronary artery disease peripheral vascular disease and dyslipidemia Denies any cardiopulmonary complaints Hospital records and laboratory studies reviewed. He does have chronic kidney disease and is scheduled for nephrology followup soon.  Past Medical History  Diagnosis Date  . CAD (coronary artery disease)   . Diabetes mellitus   . Hyperlipidemia   . Hypertension   . Myocardial infarction   . Renal insufficiency     History   Social History  . Marital Status: Widowed    Spouse Name: N/A    Number of Children: N/A  . Years of Education: N/A   Occupational History  . Not on file.   Social History Main Topics  . Smoking status: Former Smoker    Quit date: 02/28/1985  . Smokeless tobacco: Never Used  . Alcohol Use: No  . Drug Use: No  . Sexually Active: Not Currently   Other Topics Concern  . Not on file   Social History Narrative  . No narrative on file    Past Surgical History  Procedure Date  . Coronary artery bypass graft 2003  . Esophagogastroduodenoscopy 06/10/2011    Procedure: ESOPHAGOGASTRODUODENOSCOPY (EGD);  Surgeon: Ladene Artist, MD,FACG;  Location[facility] ENDOSCOPY;  Service: Endoscopy;  Laterality: N/A;    Family History  Problem Relation Age of Onset  . Diabetes      brothers and sisters  . Hypertension    . Coronary artery disease    . Stroke Mother   . Hypertension Mother   . Cancer Mother     No Known Allergies  Current Outpatient Prescriptions on File Prior to Visit  Medication Sig Dispense Refill  . benazepril (LOTENSIN) 40 MG tablet Take 40  mg by mouth daily.      . cloNIDine (CATAPRES) 0.1 MG tablet Take 0.1 mg by mouth 2 (two) times daily.      . cyanocobalamin (,VITAMIN B-12,) 1000 MCG/ML injection Inject 1 mL (1,000 mcg total) into the muscle every 14 (fourteen) days.  10 mL  5  . felodipine (PLENDIL) 10 MG 24 hr tablet Take 1 tablet (10 mg total) by mouth daily.  90 tablet  1  . furosemide (LASIX) 40 MG tablet Take 40 mg by mouth daily.      Marland Kitchen glipiZIDE (GLUCOTROL) 5 MG tablet Take 5 mg by mouth 2 (two) times daily before a meal.      . labetalol (NORMODYNE) 200 MG tablet Take 200 mg by mouth 2 (two) times daily.      . Multiple Vitamin (MULTIVITAMIN) tablet Take 1 tablet by mouth daily.      . pantoprazole (PROTONIX) 40 MG tablet Take 1 tablet (40 mg total) by mouth every morning.  30 tablet  0  . pentoxifylline (TRENTAL) 400 MG CR tablet TAKE 1 TABLET (400 MG TOTAL)  BY MOUTH 2 (TWO) TIMES DAILY AT 10 AMAND  5 PM.  180 tablet  3  . simvastatin (ZOCOR) 20 MG tablet Take 1 tablet (20 mg total) by mouth at bedtime.  90 tablet  6  . SYRINGE-NEEDLE, DISP, 3 ML 25G X  1" 3 ML MISC 1 each by Does not apply route every 14 (fourteen) days.  50 each  3  . DISCONTD: simvastatin (ZOCOR) 20 MG tablet Take 20 mg by mouth every evening.        BP 106/74  Pulse 70  Temp(Src) 97.9 F (36.6 C) (Oral)  Resp 18  Ht 5' 9.5" (1.765 m)  Wt 171 lb (77.565 kg)  BMI 24.89 kg/m2  SpO2 95%       Review of Systems  Constitutional: Negative for fever, chills, appetite change and fatigue.  HENT: Negative for hearing loss, ear pain, congestion, sore throat, trouble swallowing, neck stiffness, dental problem, voice change and tinnitus.   Eyes: Negative for pain, discharge and visual disturbance.  Respiratory: Negative for cough, chest tightness, wheezing and stridor.   Cardiovascular: Negative for chest pain, palpitations and leg swelling.  Gastrointestinal: Negative for nausea, vomiting, abdominal pain, diarrhea, constipation, blood in stool  and abdominal distention.  Genitourinary: Negative for urgency, hematuria, flank pain, discharge, difficulty urinating and genital sores.  Musculoskeletal: Negative for myalgias, back pain, joint swelling, arthralgias and gait problem.  Skin: Negative for rash.  Neurological: Negative for dizziness, syncope, speech difficulty, weakness, numbness and headaches.  Hematological: Negative for adenopathy. Does not bruise/bleed easily.  Psychiatric/Behavioral: Negative for behavioral problems and dysphoric mood. The patient is not nervous/anxious.        Objective:   Physical Exam  Constitutional: He appears well-developed and well-nourished.  HENT:  Head: Normocephalic and atraumatic.  Right Ear: External ear normal.  Left Ear: External ear normal.  Nose: Nose normal.  Mouth/Throat: Oropharynx is clear and moist.  Eyes: Conjunctivae and EOM are normal. Pupils are equal, round, and reactive to light. No scleral icterus.  Neck: Normal range of motion. Neck supple. No JVD present. No thyromegaly present.       Bilateral soft carotid and supraclavicular bruits  Cardiovascular: Normal rate, regular rhythm and normal heart sounds.  Exam reveals no gallop and no friction rub.   No murmur heard.      Occasional ectopics Grade 2/6 systolic murmur  Pedal pulses absent  Pulmonary/Chest: Effort normal and breath sounds normal. He exhibits no tenderness.       Sternotomy scar  Abdominal: Soft. Bowel sounds are normal. He exhibits no distension and no mass. There is no tenderness.       Bilateral loud femoral bruits  Genitourinary: Rectum normal and penis normal. Guaiac negative stool.  Musculoskeletal: Normal range of motion. He exhibits no edema and no tenderness.       Slight soft tissue swelling about the right ankle  Lymphadenopathy:    He has no cervical adenopathy.  Neurological: He is alert. He has normal reflexes. No cranial nerve deficit. Coordination normal.       Intact to vibratory  sensation and monofilament testing  Skin: Skin is warm and dry. No rash noted.  Psychiatric: He has a normal mood and affect. His behavior is normal.          Assessment & Plan:  Diabetes mellitus. Control is too tight. Will decrease glipizide to 2.5 mg once daily Hypertension well controlled Peripheral vascular disease stable Coronary artery disease stable Dyslipidemia Chronic kidney disease. Followup with nephrology  Recheck in 3 months

## 2011-07-11 NOTE — Patient Instructions (Signed)
Please check your hemoglobin A1c every 3 months    It is important that you exercise regularly, at least 20 minutes 3 to 4 times per week.  If you develop chest pain or shortness of breath seek  medical attention.  Iron one tablet daily

## 2011-07-12 ENCOUNTER — Other Ambulatory Visit: Payer: Self-pay

## 2011-07-12 MED ORDER — PANTOPRAZOLE SODIUM 40 MG PO TBEC: 40.0000 mg | DELAYED_RELEASE_TABLET | Freq: Every morning | ORAL | Status: DC

## 2011-07-15 ENCOUNTER — Encounter: Payer: Medicare Other | Admitting: Internal Medicine

## 2011-07-15 ENCOUNTER — Encounter: Payer: Self-pay | Admitting: Gastroenterology

## 2011-07-15 ENCOUNTER — Ambulatory Visit (INDEPENDENT_AMBULATORY_CARE_PROVIDER_SITE_OTHER): Payer: PRIVATE HEALTH INSURANCE | Admitting: Gastroenterology

## 2011-07-15 VITALS — BP 126/64 | HR 72 | Ht 71.0 in | Wt 166.0 lb

## 2011-07-15 DIAGNOSIS — K250 Acute gastric ulcer with hemorrhage: Secondary | ICD-10-CM

## 2011-07-15 DIAGNOSIS — D649 Anemia, unspecified: Secondary | ICD-10-CM

## 2011-07-15 NOTE — Patient Instructions (Signed)
You will go to the basement today to pick up your IFOB kit  You will need to return to the lab within 2 weeks You will need to follow up for labs in 1 month

## 2011-07-15 NOTE — Progress Notes (Signed)
History of Present Illness:  [name] has returned following hospitalization in April, 2013 for severe weakness and anemia. Hemoglobin was 5.6. He had no overt bleeding but was Hemoccult-positive. Upper endoscopy demonstrated a clean-based prepyloric ulcer. He had been taking NSAIDs very intermittently. Since discharge he has felt better. Weakness has resolved. He received 3 units of packed cells and takes Protonix.    Review of Systems: Pertinent positive and negative review of systems were noted in the above HPI section. All other review of systems were otherwise negative.    Current Medications, Allergies, Past Medical History, Past Surgical History, Family History and Social History were reviewed in [facility] record  Vital signs were reviewed in today's medical record. Physical Exam: General: Well developed , well nourished, no acute distress Head: Normocephalic and atraumatic Eyes:  sclerae anicteric, EOMI Ears: Normal auditory acuity Mouth: No deformity or lesions Lungs: Clear throughout to auscultation Heart: Regular rate and rhythm; no  rubs or bruits; a 1 to 2/6 early systolic murmur is present Abdomen: Soft, non tender and non distended. No masses, hepatosplenomegaly or hernias noted. Normal Bowel sounds Rectal:deferred Musculoskeletal: Symmetrical with no gross deformities  Pulses:  Normal pulses noted Extremities: No clubbing, cyanosis, edema or deformities noted Neurological: Alert oriented x 4, grossly nonfocal Psychological:  Alert and cooperative. Normal mood and affect

## 2011-07-15 NOTE — Assessment & Plan Note (Signed)
Benign-appearing prepyloric ulcer that may of been responsible for his anemia.  Recommendations #1 continue present meds #2 avoid NSAIDs #3 followup Hemoccults

## 2011-07-15 NOTE — Assessment & Plan Note (Signed)
Hemoglobin in early May was stable in the 9.7 range. Severe anemia is probably multifactorial including GI blood loss and renal insufficiency.  Recommendations #1 followup Hemoccults #2 continue Protonix #3 repeat CBC in 6 weeks

## 2011-07-20 ENCOUNTER — Other Ambulatory Visit: Payer: PRIVATE HEALTH INSURANCE

## 2011-07-20 DIAGNOSIS — Z1211 Encounter for screening for malignant neoplasm of colon: Secondary | ICD-10-CM

## 2011-07-20 DIAGNOSIS — D649 Anemia, unspecified: Secondary | ICD-10-CM

## 2011-07-21 NOTE — Progress Notes (Signed)
Quick Note:  Please inform the patient that lab work Pharmacist, hospital) was normal and to continue current plan of action ______

## 2011-09-15 ENCOUNTER — Other Ambulatory Visit (HOSPITAL_COMMUNITY): Payer: Self-pay | Admitting: *Deleted

## 2011-09-19 ENCOUNTER — Encounter (HOSPITAL_COMMUNITY)
Admission: RE | Admit: 2011-09-19 | Discharge: 2011-09-19 | Disposition: A | Discharge status: Home / Self Care | Payer: PRIVATE HEALTH INSURANCE | Source: Ambulatory Visit | Attending: Nephrology | Admitting: Nephrology

## 2011-09-19 DIAGNOSIS — D509 Iron deficiency anemia, unspecified: Secondary | ICD-10-CM | POA: Insufficient documentation

## 2011-09-19 MED ORDER — SODIUM CHLORIDE 0.9 % IV SOLN
INTRAVENOUS | Status: DC
  Administered 2011-09-19: 10:00:00 via INTRAVENOUS

## 2011-09-19 MED ORDER — FERUMOXYTOL INJECTION 510 MG/17 ML
510.0000 mg | INTRAVENOUS | Status: DC
  Administered 2011-09-19: 510 mg via INTRAVENOUS
  Filled 2011-09-19: qty 17

## 2011-09-26 ENCOUNTER — Encounter (HOSPITAL_COMMUNITY)
Admission: RE | Admit: 2011-09-26 | Discharge: 2011-09-26 | Disposition: A | Discharge status: Home / Self Care | Payer: PRIVATE HEALTH INSURANCE | Source: Ambulatory Visit | Attending: Nephrology | Admitting: Nephrology

## 2011-09-26 MED ORDER — FERUMOXYTOL INJECTION 510 MG/17 ML
510.0000 mg | INTRAVENOUS | Status: AC
  Administered 2011-09-26: 510 mg via INTRAVENOUS
  Filled 2011-09-26: qty 17

## 2011-09-26 MED ORDER — SODIUM CHLORIDE 0.9 % IV SOLN
INTRAVENOUS | Status: AC
  Administered 2011-09-26: 250 mL via INTRAVENOUS

## 2011-10-01 ENCOUNTER — Other Ambulatory Visit: Payer: Self-pay | Admitting: Internal Medicine

## 2011-10-03 ENCOUNTER — Other Ambulatory Visit: Payer: Self-pay

## 2011-10-03 MED ORDER — FUROSEMIDE 40 MG PO TABS: 40.0000 mg | ORAL_TABLET | Freq: Every day | ORAL | Status: DC

## 2011-10-03 MED ORDER — ZOLPIDEM TARTRATE 5 MG PO TABS: 5.0000 mg | ORAL_TABLET | Freq: Every evening | ORAL | Status: DC | PRN

## 2011-10-14 ENCOUNTER — Ambulatory Visit: Payer: PRIVATE HEALTH INSURANCE | Admitting: Internal Medicine

## 2011-10-21 ENCOUNTER — Ambulatory Visit (INDEPENDENT_AMBULATORY_CARE_PROVIDER_SITE_OTHER): Payer: PRIVATE HEALTH INSURANCE | Admitting: Internal Medicine

## 2011-10-21 ENCOUNTER — Encounter: Payer: Self-pay | Admitting: Internal Medicine

## 2011-10-21 VITALS — BP 122/76 | HR 65 | Temp 98.4°F | Wt 174.0 lb

## 2011-10-21 DIAGNOSIS — E119 Type 2 diabetes mellitus without complications: Secondary | ICD-10-CM

## 2011-10-21 DIAGNOSIS — N259 Disorder resulting from impaired renal tubular function, unspecified: Secondary | ICD-10-CM

## 2011-10-21 DIAGNOSIS — I2510 Atherosclerotic heart disease of native coronary artery without angina pectoris: Secondary | ICD-10-CM

## 2011-10-21 DIAGNOSIS — I10 Essential (primary) hypertension: Secondary | ICD-10-CM

## 2011-10-21 DIAGNOSIS — I739 Peripheral vascular disease, unspecified: Secondary | ICD-10-CM

## 2011-10-21 DIAGNOSIS — E538 Deficiency of other specified B group vitamins: Secondary | ICD-10-CM

## 2011-10-21 NOTE — Progress Notes (Signed)
Subjective:    Patient ID: [name], male    DOB: 08-12-1931, 78 y.o.   MRN: [id number]  HPI  78 year old patient who is seen today for followup. He has a history of type 2 diabetes now controlled on glipizide 2.5 mg daily. He is followed closely by nephrology do to chronic kidney disease. He has a history of coronary artery and peripheral arterial disease. He has done quite well. He has a history of anemia and recently has required parenteral on therapy. He is on monthly B12 injections as well. No concerns or complaints today. Has had an eye exam over the past 6 months and is scheduled for nephrology followup in one month  Past Medical History  Diagnosis Date  . CAD (coronary artery disease)   . Diabetes mellitus   . Hyperlipidemia   . Hypertension   . Myocardial infarction   . Renal insufficiency   . Gastric ulcer     History   Social History  . Marital Status: Widowed    Spouse Name: N/A    Number of Children: N/A  . Years of Education: N/A   Occupational History  . Not on file.   Social History Main Topics  . Smoking status: Former Smoker    Quit date: 02/28/1985  . Smokeless tobacco: Never Used  . Alcohol Use: No  . Drug Use: No  . Sexually Active: Not Currently   Other Topics Concern  . Not on file   Social History Narrative  . No narrative on file    Past Surgical History  Procedure Date  . Coronary artery bypass graft 2003  . Esophagogastroduodenoscopy 06/10/2011    Procedure: ESOPHAGOGASTRODUODENOSCOPY (EGD);  Surgeon: Ladene Artist, MD,FACG;  Location: [facility] ENDOSCOPY;  Service: Endoscopy;  Laterality: N/A;    Family History  Problem Relation Age of Onset  . Diabetes      brothers and sisters  . Hypertension    . Coronary artery disease    . Stroke Mother   . Hypertension Mother   . Cancer Mother     No Known Allergies  Current Outpatient Prescriptions on File Prior to Visit  Medication Sig Dispense Refill  . benazepril (LOTENSIN) 40  MG tablet Take 40 mg by mouth daily.      . cloNIDine (CATAPRES) 0.1 MG tablet Take 0.1 mg by mouth 2 (two) times daily.      . cyanocobalamin (,VITAMIN B-12,) 1000 MCG/ML injection Inject 1 mL (1,000 mcg total) into the muscle every 14 (fourteen) days.  10 mL  5  . felodipine (PLENDIL) 10 MG 24 hr tablet Take 1 tablet (10 mg total) by mouth daily.  90 tablet  1  . furosemide (LASIX) 40 MG tablet Take 1 tablet (40 mg total) by mouth daily.  90 tablet  1  . glipiZIDE (GLUCOTROL) 5 MG tablet One half tablet every morning  90 tablet  6  . labetalol (NORMODYNE) 200 MG tablet Take 200 mg by mouth 2 (two) times daily.      Marland Kitchen labetalol (NORMODYNE) 200 MG tablet TAKE 1 TABLET (200 MG TOTAL)  BY MOUTH 2 (TWO) TIMES DAILY.  180 tablet  3  . Multiple Vitamin (MULTIVITAMIN) tablet Take 1 tablet by mouth daily.      . pantoprazole (PROTONIX) 40 MG tablet Take 1 tablet (40 mg total) by mouth every morning.  90 tablet  3  . pentoxifylline (TRENTAL) 400 MG CR tablet TAKE 1 TABLET (400 MG TOTAL)  BY MOUTH 2 (  TWO) TIMES DAILY AT 10 AMAND  5 PM.  180 tablet  3  . SYRINGE-NEEDLE, DISP, 3 ML 25G X 1" 3 ML MISC 1 each by Does not apply route every 14 (fourteen) days.  50 each  3  . zolpidem (AMBIEN) 5 MG tablet Take 1 tablet (5 mg total) by mouth at bedtime as needed for sleep.  30 tablet  1  . DISCONTD: simvastatin (ZOCOR) 20 MG tablet Take 1 tablet (20 mg total) by mouth at bedtime.  90 tablet  6    BP 122/76  Pulse 65  Temp 98.4 F (36.9 C) (Oral)  Wt 174 lb (78.926 kg)  SpO2 98%     Review of Systems     Objective:   Physical Exam  Constitutional: He is oriented to person, place, and time. He appears well-developed.  HENT:  Head: Normocephalic.  Right Ear: External ear normal.  Left Ear: External ear normal.  Eyes: Conjunctivae and EOM are normal.  Neck: Normal range of motion.  Cardiovascular: Normal rate and normal heart sounds.   Pulmonary/Chest: Breath sounds normal.  Abdominal: Bowel  sounds are normal.  Musculoskeletal: Normal range of motion. He exhibits no edema and no tenderness.  Neurological: He is alert and oriented to person, place, and time.  Psychiatric: He has a normal mood and affect. His behavior is normal.          Assessment & Plan:    DM2 HTN CAD  CPX 3 moths

## 2011-10-21 NOTE — Patient Instructions (Signed)
Limit your sodium (Salt) intake   Please check your hemoglobin A1c every 3 months    It is important that you exercise regularly, at least 20 minutes 3 to 4 times per week.  If you develop chest pain or shortness of breath seek  medical attention.   

## 2011-11-17 ENCOUNTER — Other Ambulatory Visit: Payer: Self-pay | Admitting: Internal Medicine

## 2011-11-28 ENCOUNTER — Other Ambulatory Visit: Payer: Self-pay | Admitting: Internal Medicine

## 2011-12-06 ENCOUNTER — Other Ambulatory Visit: Payer: Self-pay | Admitting: Internal Medicine

## 2012-01-13 ENCOUNTER — Ambulatory Visit (INDEPENDENT_AMBULATORY_CARE_PROVIDER_SITE_OTHER): Payer: PRIVATE HEALTH INSURANCE | Admitting: Internal Medicine

## 2012-01-13 ENCOUNTER — Encounter: Payer: Self-pay | Admitting: Internal Medicine

## 2012-01-13 VITALS — BP 150/86 | HR 64 | Temp 97.7°F | Resp 18 | Wt 176.0 lb

## 2012-01-13 DIAGNOSIS — I10 Essential (primary) hypertension: Secondary | ICD-10-CM

## 2012-01-13 DIAGNOSIS — Z299 Encounter for prophylactic measures, unspecified: Secondary | ICD-10-CM

## 2012-01-13 DIAGNOSIS — E119 Type 2 diabetes mellitus without complications: Secondary | ICD-10-CM

## 2012-01-13 DIAGNOSIS — N259 Disorder resulting from impaired renal tubular function, unspecified: Secondary | ICD-10-CM

## 2012-01-13 MED ORDER — TETANUS-DIPHTH-ACELL PERTUSSIS 5-2.5-18.5 LF-MCG/0.5 IM SUSP: 0.5000 mL | Freq: Once | INTRAMUSCULAR | Status: DC

## 2012-01-13 NOTE — Progress Notes (Signed)
Spoke to pt told him Hgb A1C is elevated and need to take one full tablet of Glipizide every morning per Dr. Raliegh Ip and increase exercise and watch diet. Pt verbalized understanding.

## 2012-01-13 NOTE — Progress Notes (Signed)
Subjective:    Patient ID: [name], male    DOB: Mar 18, 1931, 78 y.o.   MRN: [id number]  HPI  78 year old patient who is seen today for followup. He has a history of type 2 diabetes dyslipidemia hypertension and chronic kidney disease. He is doing quite well. His diabetes has been well-controlled on low-dose glipizide. He was seen by nephrology 2 weeks ago and is scheduled to see ophthalmology next month. He feels well and denies any complaints. No recent hemoglobin A 1C.  Past Medical History  Diagnosis Date  . CAD (coronary artery disease)   . Diabetes mellitus   . Hyperlipidemia   . Hypertension   . Myocardial infarction   . Renal insufficiency   . Gastric ulcer     History   Social History  . Marital Status: Widowed    Spouse Name: N/A    Number of Children: N/A  . Years of Education: N/A   Occupational History  . Not on file.   Social History Main Topics  . Smoking status: Former Smoker    Quit date: 02/28/1985  . Smokeless tobacco: Never Used  . Alcohol Use: No  . Drug Use: No  . Sexually Active: Not Currently   Other Topics Concern  . Not on file   Social History Narrative  . No narrative on file    Past Surgical History  Procedure Date  . Coronary artery bypass graft 2003  . Esophagogastroduodenoscopy 06/10/2011    Procedure: ESOPHAGOGASTRODUODENOSCOPY (EGD);  Surgeon: Ladene Artist, MD,FACG;  Location: [facility] ENDOSCOPY;  Service: Endoscopy;  Laterality: N/A;    Family History  Problem Relation Age of Onset  . Diabetes      brothers and sisters  . Hypertension    . Coronary artery disease    . Stroke Mother   . Hypertension Mother   . Cancer Mother     No Known Allergies  Current Outpatient Prescriptions on File Prior to Visit  Medication Sig Dispense Refill  . benazepril (LOTENSIN) 40 MG tablet Take 40 mg by mouth daily.      . benazepril (LOTENSIN) 40 MG tablet TAKE 1 TABLET (40 MG TOTAL) BY MOUTH DAILY.  90 tablet  3  . cloNIDine  (CATAPRES) 0.1 MG tablet Take 0.1 mg by mouth 2 (two) times daily.      . cloNIDine (CATAPRES) 0.1 MG tablet TAKE 1 TABLET (0.1 MG TOTAL)  BY MOUTH AT BEDTIME.  90 tablet  3  . cyanocobalamin (,VITAMIN B-12,) 1000 MCG/ML injection Inject 1 mL (1,000 mcg total) into the muscle every 14 (fourteen) days.  10 mL  5  . felodipine (PLENDIL) 10 MG 24 hr tablet TAKE 1 TABLET (10 MG TOTAL) BY MOUTH DAILY.  90 tablet  3  . furosemide (LASIX) 40 MG tablet Take 1 tablet (40 mg total) by mouth daily.  90 tablet  1  . glipiZIDE (GLUCOTROL) 5 MG tablet One half tablet every morning  90 tablet  6  . labetalol (NORMODYNE) 200 MG tablet Take 200 mg by mouth 2 (two) times daily.      Marland Kitchen labetalol (NORMODYNE) 200 MG tablet TAKE 1 TABLET (200 MG TOTAL)  BY MOUTH 2 (TWO) TIMES DAILY.  180 tablet  3  . Multiple Vitamin (MULTIVITAMIN) tablet Take 1 tablet by mouth daily.      . pantoprazole (PROTONIX) 40 MG tablet Take 1 tablet (40 mg total) by mouth every morning.  90 tablet  3  . pentoxifylline (TRENTAL) 400 MG  CR tablet TAKE 1 TABLET (400 MG TOTAL)  BY MOUTH 2 (TWO) TIMES DAILY AT 10 AMAND  5 PM.  180 tablet  3  . simvastatin (ZOCOR) 20 MG tablet Take 20 mg by mouth at bedtime.      . simvastatin (ZOCOR) 20 MG tablet TAKE 1 TABLET (20 MG TOTAL) BY MOUTH AT BEDTIME.  90 tablet  3  . SYRINGE-NEEDLE, DISP, 3 ML 25G X 1" 3 ML MISC 1 each by Does not apply route every 14 (fourteen) days.  50 each  3  . zolpidem (AMBIEN) 5 MG tablet Take 1 tablet (5 mg total) by mouth at bedtime as needed for sleep.  30 tablet  1   No current facility-administered medications on file prior to visit.    BP 150/86  Pulse 64  Temp 97.7 F (36.5 C) (Oral)  Resp 18  Wt 176 lb (79.833 kg)  SpO2 98%       Review of Systems  Constitutional: Negative for fever, chills, appetite change and fatigue.  HENT: Negative for hearing loss, ear pain, congestion, sore throat, trouble swallowing, neck stiffness, dental problem, voice change and  tinnitus.   Eyes: Negative for pain, discharge and visual disturbance.  Respiratory: Negative for cough, chest tightness, wheezing and stridor.   Cardiovascular: Negative for chest pain, palpitations and leg swelling.  Gastrointestinal: Negative for nausea, vomiting, abdominal pain, diarrhea, constipation, blood in stool and abdominal distention.  Genitourinary: Negative for urgency, hematuria, flank pain, discharge, difficulty urinating and genital sores.  Musculoskeletal: Negative for myalgias, back pain, joint swelling, arthralgias and gait problem.  Skin: Negative for rash.  Neurological: Negative for dizziness, syncope, speech difficulty, weakness, numbness and headaches.  Hematological: Negative for adenopathy. Does not bruise/bleed easily.  Psychiatric/Behavioral: Negative for behavioral problems and dysphoric mood. The patient is not nervous/anxious.        Objective:   Physical Exam  Constitutional: He is oriented to person, place, and time. He appears well-developed.  HENT:  Head: Normocephalic.  Right Ear: External ear normal.  Left Ear: External ear normal.  Eyes: Conjunctivae normal and EOM are normal.  Neck: Normal range of motion.  Cardiovascular: Normal rate and normal heart sounds.   Pulmonary/Chest: Breath sounds normal.  Abdominal: Bowel sounds are normal.  Musculoskeletal: Normal range of motion. He exhibits no edema and no tenderness.  Neurological: He is alert and oriented to person, place, and time.  Psychiatric: He has a normal mood and affect. His behavior is normal.          Assessment & Plan:   Diabetes mellitus. Will check a hemoglobin A1c. Continue glipizide. No symptoms of hypoglycemia Hypertension well controlled Chronic kidney disease. Followup nephrology  Recheck 3 months

## 2012-01-13 NOTE — Patient Instructions (Signed)
Limit your sodium (Salt) intake   Please check your hemoglobin A1c every 3 months    It is important that you exercise regularly, at least 20 minutes 3 to 4 times per week.  If you develop chest pain or shortness of breath seek  medical attention.   

## 2012-01-20 ENCOUNTER — Ambulatory Visit: Payer: PRIVATE HEALTH INSURANCE | Admitting: Internal Medicine

## 2012-01-27 ENCOUNTER — Ambulatory Visit: Payer: PRIVATE HEALTH INSURANCE | Admitting: Internal Medicine

## 2012-02-08 ENCOUNTER — Other Ambulatory Visit: Payer: Self-pay | Admitting: *Deleted

## 2012-02-08 MED ORDER — GLIPIZIDE 5 MG PO TABS: ORAL_TABLET | ORAL | Status: DC

## 2012-03-31 ENCOUNTER — Other Ambulatory Visit: Payer: Self-pay | Admitting: Internal Medicine

## 2012-04-13 ENCOUNTER — Ambulatory Visit: Payer: PRIVATE HEALTH INSURANCE | Admitting: Internal Medicine

## 2012-04-23 ENCOUNTER — Encounter: Payer: Self-pay | Admitting: Internal Medicine

## 2012-04-23 ENCOUNTER — Ambulatory Visit: Payer: PRIVATE HEALTH INSURANCE | Admitting: Internal Medicine

## 2012-04-23 ENCOUNTER — Ambulatory Visit (INDEPENDENT_AMBULATORY_CARE_PROVIDER_SITE_OTHER): Payer: Medicare Other | Admitting: Internal Medicine

## 2012-04-23 VITALS — BP 120/80 | HR 68 | Temp 97.8°F | Resp 18 | Wt 172.0 lb

## 2012-04-23 DIAGNOSIS — E119 Type 2 diabetes mellitus without complications: Secondary | ICD-10-CM

## 2012-04-23 DIAGNOSIS — I2510 Atherosclerotic heart disease of native coronary artery without angina pectoris: Secondary | ICD-10-CM

## 2012-04-23 DIAGNOSIS — N259 Disorder resulting from impaired renal tubular function, unspecified: Secondary | ICD-10-CM

## 2012-04-23 DIAGNOSIS — I10 Essential (primary) hypertension: Secondary | ICD-10-CM

## 2012-04-23 DIAGNOSIS — E785 Hyperlipidemia, unspecified: Secondary | ICD-10-CM

## 2012-04-23 NOTE — Patient Instructions (Signed)
Limit your sodium (Salt) intake   Please check your hemoglobin A1c every 3 months  Please check your blood pressure on a regular basis.  If it is consistently greater than 150/90, please make an office appointment.   

## 2012-04-23 NOTE — Progress Notes (Signed)
Subjective:    Patient ID: [name], male    DOB: November 02, 1931, 79 y.o.   MRN: [id number]  HPI  79 year old patient who is seen today for followup of type 2 diabetes. He has dyslipidemia hypertension and a history of coronary artery and peripheral vascular disease. He has chronic kidney disease. He is doing quite well. His last hemoglobin A1c was elevated to 8.7. Over the past prior year and a half they had been well-controlled. He now is on glipizide 5 mg daily. Denies any cardiopulmonary complaints Denies any focal neurological symptoms  Past Medical History  Diagnosis Date  . CAD (coronary artery disease)   . Diabetes mellitus   . Hyperlipidemia   . Hypertension   . Myocardial infarction   . Renal insufficiency   . Gastric ulcer     History   Social History  . Marital Status: Widowed    Spouse Name: N/A    Number of Children: N/A  . Years of Education: N/A   Occupational History  . Not on file.   Social History Main Topics  . Smoking status: Former Smoker    Quit date: 02/28/1985  . Smokeless tobacco: Never Used  . Alcohol Use: No  . Drug Use: No  . Sexually Active: Not Currently   Other Topics Concern  . Not on file   Social History Narrative  . No narrative on file    Past Surgical History  Procedure Laterality Date  . Coronary artery bypass graft  2003  . Esophagogastroduodenoscopy  06/10/2011    Procedure: ESOPHAGOGASTRODUODENOSCOPY (EGD);  Surgeon: Ladene Artist, MD,FACG;  Location: [facility] ENDOSCOPY;  Service: Endoscopy;  Laterality: N/A;    Family History  Problem Relation Age of Onset  . Diabetes      brothers and sisters  . Hypertension    . Coronary artery disease    . Stroke Mother   . Hypertension Mother   . Cancer Mother     No Known Allergies  Current Outpatient Prescriptions on File Prior to Visit  Medication Sig Dispense Refill  . benazepril (LOTENSIN) 40 MG tablet Take 40 mg by mouth daily.      . cloNIDine (CATAPRES) 0.1 MG  tablet TAKE 1 TABLET (0.1 MG TOTAL)  BY MOUTH AT BEDTIME.  90 tablet  3  . cyanocobalamin (,VITAMIN B-12,) 1000 MCG/ML injection Inject 1 mL (1,000 mcg total) into the muscle every 14 (fourteen) days.  10 mL  5  . felodipine (PLENDIL) 10 MG 24 hr tablet TAKE 1 TABLET (10 MG TOTAL) BY MOUTH DAILY.  90 tablet  3  . furosemide (LASIX) 40 MG tablet TAKE ONE TABLET BY MOUTH DAILY  90 tablet  0  . glipiZIDE (GLUCOTROL) 5 MG tablet One tablet by mouth every morning.  90 tablet  0  . labetalol (NORMODYNE) 200 MG tablet TAKE 1 TABLET (200 MG TOTAL)  BY MOUTH 2 (TWO) TIMES DAILY.  180 tablet  3  . Multiple Vitamin (MULTIVITAMIN) tablet Take 1 tablet by mouth daily.      . pentoxifylline (TRENTAL) 400 MG CR tablet TAKE 1 TABLET (400 MG TOTAL)  BY MOUTH 2 (TWO) TIMES DAILY AT 10 AMAND  5 PM.  180 tablet  3  . simvastatin (ZOCOR) 20 MG tablet TAKE 1 TABLET (20 MG TOTAL) BY MOUTH AT BEDTIME.  90 tablet  3  . SYRINGE-NEEDLE, DISP, 3 ML 25G X 1" 3 ML MISC 1 each by Does not apply route every 14 (fourteen) days.  50 each  3  . zolpidem (AMBIEN) 5 MG tablet Take 1 tablet (5 mg total) by mouth at bedtime as needed for sleep.  30 tablet  1   No current facility-administered medications on file prior to visit.    BP 120/80  Pulse 68  Temp(Src) 97.8 F (36.6 C) (Oral)  Resp 18  Wt 172 lb (78.019 kg)  BMI 24.68 kg/m2  SpO2 97%       Review of Systems  Constitutional: Negative for fever, chills, appetite change and fatigue.  HENT: Negative for hearing loss, ear pain, congestion, sore throat, trouble swallowing, neck stiffness, dental problem, voice change and tinnitus.   Eyes: Negative for pain, discharge and visual disturbance.  Respiratory: Negative for cough, chest tightness, wheezing and stridor.   Cardiovascular: Negative for chest pain, palpitations and leg swelling.  Gastrointestinal: Negative for nausea, vomiting, abdominal pain, diarrhea, constipation, blood in stool and abdominal distention.   Genitourinary: Negative for urgency, hematuria, flank pain, discharge, difficulty urinating and genital sores.  Musculoskeletal: Negative for myalgias, back pain, joint swelling, arthralgias and gait problem.  Skin: Negative for rash.  Neurological: Negative for dizziness, syncope, speech difficulty, weakness, numbness and headaches.  Hematological: Negative for adenopathy. Does not bruise/bleed easily.  Psychiatric/Behavioral: Negative for behavioral problems and dysphoric mood. The patient is not nervous/anxious.        Objective:   Physical Exam  Constitutional: He is oriented to person, place, and time. He appears well-developed.  HENT:  Head: Normocephalic.  Right Ear: External ear normal.  Left Ear: External ear normal.  Eyes: Conjunctivae and EOM are normal.  Neck: Normal range of motion.  Cardiovascular: Normal rate and normal heart sounds.   Pulmonary/Chest: Breath sounds normal.  Abdominal: Bowel sounds are normal.  Musculoskeletal: Normal range of motion. He exhibits no edema and no tenderness.  Neurological: He is alert and oriented to person, place, and time.  Psychiatric: He has a normal mood and affect. His behavior is normal.          Assessment & Plan:   Diabetes mellitus. Will check a hemoglobin A1c. May need additional medication Hypertension well controlled Dyslipidemia. We'll check a lipid panel in 3 months

## 2012-04-25 ENCOUNTER — Encounter: Payer: Self-pay | Admitting: Internal Medicine

## 2012-04-25 ENCOUNTER — Ambulatory Visit (INDEPENDENT_AMBULATORY_CARE_PROVIDER_SITE_OTHER): Payer: Medicare Other | Admitting: Internal Medicine

## 2012-04-25 VITALS — BP 140/80 | HR 52 | Temp 98.3°F | Resp 18 | Wt 173.0 lb

## 2012-04-25 DIAGNOSIS — I2510 Atherosclerotic heart disease of native coronary artery without angina pectoris: Secondary | ICD-10-CM

## 2012-04-25 DIAGNOSIS — N259 Disorder resulting from impaired renal tubular function, unspecified: Secondary | ICD-10-CM

## 2012-04-25 DIAGNOSIS — E119 Type 2 diabetes mellitus without complications: Secondary | ICD-10-CM

## 2012-04-25 MED ORDER — INSULIN GLARGINE 100 UNIT/ML ~~LOC~~ SOLN: SUBCUTANEOUS | Status: DC

## 2012-04-25 MED ORDER — INSULIN LISPRO 100 UNIT/ML ~~LOC~~ SOLN: SUBCUTANEOUS | Status: DC

## 2012-04-25 NOTE — Progress Notes (Signed)
Subjective:    Patient ID: [name], male    DOB: [birth year], 79 y.o.   MRN: [id number]  HPI  79 year old patient who is seen today for followup.  Hemoglobin A1c is have been increasing and yesterday was approaching 11. He is seen today to initiate insulin therapy. The Mepron will be discontinued Insulin therapy discussed at length as well as injection therapy samples of both a bolus and Lantus insulin dispensed.  Past Medical History  Diagnosis Date  . CAD (coronary artery disease)   . Diabetes mellitus   . Hyperlipidemia   . Hypertension   . Myocardial infarction   . Renal insufficiency   . Gastric ulcer     History   Social History  . Marital Status: Widowed    Spouse Name: N/A    Number of Children: N/A  . Years of Education: N/A   Occupational History  . Not on file.   Social History Main Topics  . Smoking status: Former Smoker    Quit date: 02/28/1985  . Smokeless tobacco: Never Used  . Alcohol Use: No  . Drug Use: No  . Sexually Active: Not Currently   Other Topics Concern  . Not on file   Social History Narrative  . No narrative on file    Past Surgical History  Procedure Laterality Date  . Coronary artery bypass graft  2003  . Esophagogastroduodenoscopy  06/10/2011    Procedure: ESOPHAGOGASTRODUODENOSCOPY (EGD);  Surgeon: Ladene Artist, MD,FACG;  Location: [facility] ENDOSCOPY;  Service: Endoscopy;  Laterality: N/A;    Family History  Problem Relation Age of Onset  . Diabetes      brothers and sisters  . Hypertension    . Coronary artery disease    . Stroke Mother   . Hypertension Mother   . Cancer Mother     No Known Allergies  Current Outpatient Prescriptions on File Prior to Visit  Medication Sig Dispense Refill  . benazepril (LOTENSIN) 40 MG tablet Take 40 mg by mouth daily.      . cloNIDine (CATAPRES) 0.1 MG tablet TAKE 1 TABLET (0.1 MG TOTAL)  BY MOUTH AT BEDTIME.  90 tablet  3  . cyanocobalamin (,VITAMIN B-12,) 1000 MCG/ML  injection Inject 1 mL (1,000 mcg total) into the muscle every 14 (fourteen) days.  10 mL  5  . felodipine (PLENDIL) 10 MG 24 hr tablet TAKE 1 TABLET (10 MG TOTAL) BY MOUTH DAILY.  90 tablet  3  . furosemide (LASIX) 40 MG tablet TAKE ONE TABLET BY MOUTH DAILY  90 tablet  0  . glipiZIDE (GLUCOTROL) 5 MG tablet One tablet by mouth every morning.  90 tablet  0  . labetalol (NORMODYNE) 200 MG tablet TAKE 1 TABLET (200 MG TOTAL)  BY MOUTH 2 (TWO) TIMES DAILY.  180 tablet  3  . Multiple Vitamin (MULTIVITAMIN) tablet Take 1 tablet by mouth daily.      . pentoxifylline (TRENTAL) 400 MG CR tablet TAKE 1 TABLET (400 MG TOTAL)  BY MOUTH 2 (TWO) TIMES DAILY AT 10 AMAND  5 PM.  180 tablet  3  . simvastatin (ZOCOR) 20 MG tablet TAKE 1 TABLET (20 MG TOTAL) BY MOUTH AT BEDTIME.  90 tablet  3  . SYRINGE-NEEDLE, DISP, 3 ML 25G X 1" 3 ML MISC 1 each by Does not apply route every 14 (fourteen) days.  50 each  3  . zolpidem (AMBIEN) 5 MG tablet Take 1 tablet (5 mg total) by mouth at bedtime  as needed for sleep.  30 tablet  1   No current facility-administered medications on file prior to visit.    BP 140/80  Pulse 52  Temp(Src) 98.3 F (36.8 C) (Oral)  Resp 18  Wt 173 lb (78.472 kg)  BMI 24.82 kg/m2  SpO2 97%       Review of Systems     Objective:   Physical Exam  Constitutional: He appears well-developed and well-nourished. No distress.          Assessment & Plan:   Diabetes mellitus. Poor control. We'll initiate the Lantus insulin at 12 units at bedtime. Patient states that his largest meal is breakfast we'll start with the bolus insulin 4 units prior to breakfast only and reassess in one month

## 2012-04-25 NOTE — Patient Instructions (Addendum)
Return in one month for follow-up  

## 2012-05-02 ENCOUNTER — Other Ambulatory Visit: Payer: Self-pay | Admitting: *Deleted

## 2012-05-02 MED ORDER — INSULIN LISPRO 100 UNIT/ML ~~LOC~~ SOLN: SUBCUTANEOUS | Status: DC

## 2012-05-03 ENCOUNTER — Other Ambulatory Visit: Payer: Self-pay | Admitting: Internal Medicine

## 2012-05-10 ENCOUNTER — Other Ambulatory Visit: Payer: Self-pay | Admitting: *Deleted

## 2012-05-10 NOTE — Telephone Encounter (Signed)
Left message on voicemail.

## 2012-05-12 MED ORDER — ZOLPIDEM TARTRATE 5 MG PO TABS: 5.0000 mg | ORAL_TABLET | Freq: Every evening | ORAL | Status: DC | PRN

## 2012-05-12 NOTE — Telephone Encounter (Signed)
Spoke to pt told him called in Rx for Ambien to pharmacy and need to change insulin from Hunalog to Novolog due to insurance and I wanted to know if wants pen or vial. Pt said the pen but has enough right now and has an appt on Tues. Told him okay will give written Rx when he comes in. Pt verbalized understanding.

## 2012-05-22 ENCOUNTER — Encounter: Payer: Self-pay | Admitting: Internal Medicine

## 2012-05-22 ENCOUNTER — Ambulatory Visit (INDEPENDENT_AMBULATORY_CARE_PROVIDER_SITE_OTHER): Payer: Medicare Other | Admitting: Internal Medicine

## 2012-05-22 VITALS — BP 140/80 | HR 67 | Temp 97.8°F | Resp 18 | Wt 171.0 lb

## 2012-05-22 DIAGNOSIS — I10 Essential (primary) hypertension: Secondary | ICD-10-CM

## 2012-05-22 DIAGNOSIS — E119 Type 2 diabetes mellitus without complications: Secondary | ICD-10-CM

## 2012-05-22 MED ORDER — INSULIN GLARGINE 100 UNIT/ML ~~LOC~~ SOLN: 14.0000 [IU] | Freq: Every day | SUBCUTANEOUS | Status: DC

## 2012-05-22 MED ORDER — INSULIN ASPART 100 UNIT/ML ~~LOC~~ SOLN: SUBCUTANEOUS | Status: DC

## 2012-05-22 NOTE — Patient Instructions (Signed)
Limit your sodium (Salt) intake   Please check your hemoglobin A1c every 3 months   

## 2012-05-22 NOTE — Progress Notes (Signed)
Subjective:    Patient ID: [name], male    DOB: Mar 09, 1931, 79 y.o.   MRN: [id number]  HPI  79 year old patient who is seen today for followup of diabetes. He has treated hypertension. He has chronic kidney disease and is scheduled for nephrology followup soon. He was started on basal and bolus insulin therapy one month ago. Presently is using Lantus 12 units at bedtime and 4 units of NovoLog prior to breakfast which is his largest meal.  No recent fasting blood sugars. He states blood sugars generally run 1:30 to 140 throughout the day. No hypoglycemia  Past Medical History  Diagnosis Date  . CAD (coronary artery disease)   . Diabetes mellitus   . Hyperlipidemia   . Hypertension   . Myocardial infarction   . Renal insufficiency   . Gastric ulcer     History   Social History  . Marital Status: Widowed    Spouse Name: N/A    Number of Children: N/A  . Years of Education: N/A   Occupational History  . Not on file.   Social History Main Topics  . Smoking status: Former Smoker    Quit date: 02/28/1985  . Smokeless tobacco: Never Used  . Alcohol Use: No  . Drug Use: No  . Sexually Active: Not Currently   Other Topics Concern  . Not on file   Social History Narrative  . No narrative on file    Past Surgical History  Procedure Laterality Date  . Coronary artery bypass graft  2003  . Esophagogastroduodenoscopy  06/10/2011    Procedure: ESOPHAGOGASTRODUODENOSCOPY (EGD);  Surgeon: Ladene Artist, MD,FACG;  Location: [facility] ENDOSCOPY;  Service: Endoscopy;  Laterality: N/A;    Family History  Problem Relation Age of Onset  . Diabetes      brothers and sisters  . Hypertension    . Coronary artery disease    . Stroke Mother   . Hypertension Mother   . Cancer Mother     No Known Allergies  Current Outpatient Prescriptions on File Prior to Visit  Medication Sig Dispense Refill  . benazepril (LOTENSIN) 40 MG tablet Take 40 mg by mouth daily.      . cloNIDine  (CATAPRES) 0.1 MG tablet TAKE 1 TABLET (0.1 MG TOTAL)  BY MOUTH AT BEDTIME.  90 tablet  3  . cyanocobalamin (,VITAMIN B-12,) 1000 MCG/ML injection Inject 1 mL (1,000 mcg total) into the muscle every 14 (fourteen) days.  10 mL  5  . felodipine (PLENDIL) 10 MG 24 hr tablet TAKE 1 TABLET (10 MG TOTAL) BY MOUTH DAILY.  90 tablet  3  . furosemide (LASIX) 40 MG tablet TAKE ONE TABLET BY MOUTH DAILY  90 tablet  0  . glipiZIDE (GLUCOTROL) 5 MG tablet TAKE  ONE TABLET BY MOUTH EVERY MORNING  90 tablet  1  . labetalol (NORMODYNE) 200 MG tablet TAKE 1 TABLET (200 MG TOTAL)  BY MOUTH 2 (TWO) TIMES DAILY.  180 tablet  3  . Multiple Vitamin (MULTIVITAMIN) tablet Take 1 tablet by mouth daily.      . pentoxifylline (TRENTAL) 400 MG CR tablet TAKE 1 TABLET (400 MG TOTAL)  BY MOUTH 2 (TWO) TIMES DAILY AT 10 AMAND  5 PM.  180 tablet  3  . simvastatin (ZOCOR) 20 MG tablet TAKE 1 TABLET (20 MG TOTAL) BY MOUTH AT BEDTIME.  90 tablet  3  . SYRINGE-NEEDLE, DISP, 3 ML 25G X 1" 3 ML MISC 1 each by Does  not apply route every 14 (fourteen) days.  50 each  3  . zolpidem (AMBIEN) 5 MG tablet Take 1 tablet (5 mg total) by mouth at bedtime as needed for sleep.  30 tablet  2   No current facility-administered medications on file prior to visit.    BP 140/80  Pulse 67  Temp(Src) 97.8 F (36.6 C) (Oral)  Resp 18  Wt 171 lb (77.565 kg)  BMI 24.54 kg/m2  SpO2 99%       Review of Systems  Constitutional: Negative for fever, chills, appetite change and fatigue.  HENT: Negative for hearing loss, ear pain, congestion, sore throat, trouble swallowing, neck stiffness, dental problem, voice change and tinnitus.   Eyes: Negative for pain, discharge and visual disturbance.  Respiratory: Negative for cough, chest tightness, wheezing and stridor.   Cardiovascular: Negative for chest pain, palpitations and leg swelling.  Gastrointestinal: Negative for nausea, vomiting, abdominal pain, diarrhea, constipation, blood in stool and  abdominal distention.  Genitourinary: Negative for urgency, hematuria, flank pain, discharge, difficulty urinating and genital sores.  Musculoskeletal: Negative for myalgias, back pain, joint swelling, arthralgias and gait problem.  Skin: Negative for rash.  Neurological: Negative for dizziness, syncope, speech difficulty, weakness, numbness and headaches.  Hematological: Negative for adenopathy. Does not bruise/bleed easily.  Psychiatric/Behavioral: Negative for behavioral problems and dysphoric mood. The patient is not nervous/anxious.        Objective:   Physical Exam  Constitutional: He appears well-developed and well-nourished. No distress.  Pressure 140/80          Assessment & Plan:   Diabetes mellitus.  Basal and bolus insulin adjusted slightly. The patient was asked to check a fasting blood sugars every morning. We'll recheck in 2 months with a hemoglobin A1c at that time Hypertension stable Chronic kidney disease. Followup nephrology

## 2012-05-23 ENCOUNTER — Ambulatory Visit: Payer: Medicare Other | Admitting: Internal Medicine

## 2012-05-25 ENCOUNTER — Other Ambulatory Visit: Payer: Self-pay | Admitting: *Deleted

## 2012-05-25 MED ORDER — LABETALOL HCL 200 MG PO TABS: ORAL_TABLET | ORAL | Status: DC

## 2012-05-25 MED ORDER — FELODIPINE ER 10 MG PO TB24: ORAL_TABLET | ORAL | Status: DC

## 2012-05-30 ENCOUNTER — Telehealth: Payer: Self-pay | Admitting: Internal Medicine

## 2012-05-30 MED ORDER — AMLODIPINE BESYLATE 10 MG PO TABS: 10.0000 mg | ORAL_TABLET | Freq: Every day | ORAL | Status: DC

## 2012-05-30 NOTE — Telephone Encounter (Signed)
Hi [name], this patient would like for Dr. Raliegh Ip to approve of either AMLODIPINE, DILTIAZEM, OR VERAPAMIL (no co pay with [facility]) to take the place of his FELODIPINE ($40 with [facility]). He says he came by Monday about this and he hasn't received a call back yet. Please advise.

## 2012-05-30 NOTE — Telephone Encounter (Signed)
Spoke to pt told him medication changed to Amlodipine 10 mg one tablet daily and sent to [facility]. Pt verbalized understanding.

## 2012-06-21 ENCOUNTER — Other Ambulatory Visit: Payer: Self-pay | Admitting: *Deleted

## 2012-06-21 MED ORDER — PENTOXIFYLLINE ER 400 MG PO TBCR: EXTENDED_RELEASE_TABLET | ORAL | Status: DC

## 2012-06-21 NOTE — Telephone Encounter (Signed)
Left detailed message that Rx refill requested Pentoxifylline was sent to [facility].

## 2012-07-04 ENCOUNTER — Other Ambulatory Visit: Payer: Medicare Other

## 2012-07-04 ENCOUNTER — Telehealth: Payer: Self-pay | Admitting: *Deleted

## 2012-07-04 NOTE — Telephone Encounter (Signed)
Left message on voicemail to call office. Need to see where pt gets Diabetic supplies? Received fax from [facility].

## 2012-07-05 NOTE — Telephone Encounter (Signed)
Spoke to pt asked him if he get Diabetic supplies for [facility] I received a fax requesting refill for supplies. Pt stated yes he uses [facility]. Told pt okay will send renewal order. Order faxed.

## 2012-07-08 ENCOUNTER — Other Ambulatory Visit: Payer: Self-pay | Admitting: Internal Medicine

## 2012-07-11 ENCOUNTER — Encounter: Payer: Medicare Other | Admitting: Internal Medicine

## 2012-07-18 ENCOUNTER — Other Ambulatory Visit: Payer: Self-pay | Admitting: *Deleted

## 2012-07-18 MED ORDER — PENTOXIFYLLINE ER 400 MG PO TBCR: EXTENDED_RELEASE_TABLET | ORAL | Status: DC

## 2012-07-18 MED ORDER — FUROSEMIDE 40 MG PO TABS: ORAL_TABLET | ORAL | Status: DC

## 2012-07-18 MED ORDER — ZOLPIDEM TARTRATE 5 MG PO TABS: 5.0000 mg | ORAL_TABLET | Freq: Every evening | ORAL | Status: DC | PRN

## 2012-07-18 NOTE — Telephone Encounter (Signed)
Pt notified Rx refills were sent to pharmacy.

## 2012-07-24 ENCOUNTER — Ambulatory Visit (INDEPENDENT_AMBULATORY_CARE_PROVIDER_SITE_OTHER): Payer: Medicare Other | Admitting: Internal Medicine

## 2012-07-24 ENCOUNTER — Encounter: Payer: Self-pay | Admitting: Internal Medicine

## 2012-07-24 VITALS — BP 130/60 | HR 60 | Temp 98.4°F | Resp 18 | Wt 174.0 lb

## 2012-07-24 DIAGNOSIS — E119 Type 2 diabetes mellitus without complications: Secondary | ICD-10-CM

## 2012-07-24 DIAGNOSIS — I2510 Atherosclerotic heart disease of native coronary artery without angina pectoris: Secondary | ICD-10-CM

## 2012-07-24 DIAGNOSIS — E785 Hyperlipidemia, unspecified: Secondary | ICD-10-CM

## 2012-07-24 DIAGNOSIS — N259 Disorder resulting from impaired renal tubular function, unspecified: Secondary | ICD-10-CM

## 2012-07-24 DIAGNOSIS — I10 Essential (primary) hypertension: Secondary | ICD-10-CM

## 2012-07-24 LAB — HEMOGLOBIN A1C: Hgb A1c MFr Bld: 8.4 % — ABNORMAL HIGH (ref 4.6–6.5)

## 2012-07-24 MED ORDER — FUROSEMIDE 40 MG PO TABS: ORAL_TABLET | ORAL | Status: DC

## 2012-07-24 MED ORDER — CLONIDINE HCL 0.1 MG PO TABS: ORAL_TABLET | ORAL | Status: DC

## 2012-07-24 NOTE — Progress Notes (Signed)
Subjective:    Patient ID: [name], male    DOB: [birth year], 79 y.o.   MRN: [id number]  HPI  79 year old patient who is seen today for followup. He is now on basal and bolus insulin prior to his breakfast each day. He has been seen by renal medicine recently and is scheduled for ophthalmology evaluation in July. He states blood sugars generally run between 1:15 and 155. He has coronary artery disease which has been stable. Denies any exertional chest pain. He has dyslipidemia and remains on simvastatin. No concerns or complaints.  Lab Results  Component Value Date   HGBA1C 10.9* 04/23/2012    Past Medical History  Diagnosis Date  . CAD (coronary artery disease)   . Diabetes mellitus   . Hyperlipidemia   . Hypertension   . Myocardial infarction   . Renal insufficiency   . Gastric ulcer     History   Social History  . Marital Status: Widowed    Spouse Name: N/A    Number of Children: N/A  . Years of Education: N/A   Occupational History  . Not on file.   Social History Main Topics  . Smoking status: Former Smoker    Quit date: 02/28/1985  . Smokeless tobacco: Never Used  . Alcohol Use: No  . Drug Use: No  . Sexually Active: Not Currently   Other Topics Concern  . Not on file   Social History Narrative  . No narrative on file    Past Surgical History  Procedure Laterality Date  . Coronary artery bypass graft  2003  . Esophagogastroduodenoscopy  06/10/2011    Procedure: ESOPHAGOGASTRODUODENOSCOPY (EGD);  Surgeon: Ladene Artist, MD,FACG;  Location: [facility] ENDOSCOPY;  Service: Endoscopy;  Laterality: N/A;    Family History  Problem Relation Age of Onset  . Diabetes      brothers and sisters  . Hypertension    . Coronary artery disease    . Stroke Mother   . Hypertension Mother   . Cancer Mother     No Known Allergies  Current Outpatient Prescriptions on File Prior to Visit  Medication Sig Dispense Refill  . amLODipine (NORVASC) 10 MG tablet  Take 1 tablet (10 mg total) by mouth daily.  90 tablet  1  . benazepril (LOTENSIN) 40 MG tablet Take 40 mg by mouth daily.      . cyanocobalamin (,VITAMIN B-12,) 1000 MCG/ML injection INJECT 1 ML (1,000 MCG TOTAL) INTO THE MUSCLE EVERY  14 (FOURTEEN)DAYS .  2 mL  4  . HUMALOG KWIKPEN 100 UNIT/ML injection Inject 6 Units into the skin daily before breakfast.       . insulin glargine (LANTUS SOLOSTAR) 100 UNIT/ML injection Inject 0.14 mLs (14 Units total) into the skin at bedtime.  5 pen  PRN  . labetalol (NORMODYNE) 200 MG tablet TAKE 1 TABLET (200 MG TOTAL)  BY MOUTH 2 (TWO) TIMES DAILY.  180 tablet  3  . Multiple Vitamin (MULTIVITAMIN) tablet Take 1 tablet by mouth daily.      . pentoxifylline (TRENTAL) 400 MG CR tablet TAKE 1 TABLET (400 MG TOTAL)  BY MOUTH 2 (TWO) TIMES DAILY AT 10 AMAND  5 PM.  180 tablet  3  . simvastatin (ZOCOR) 20 MG tablet TAKE 1 TABLET (20 MG TOTAL) BY MOUTH AT BEDTIME.  90 tablet  3  . SYRINGE-NEEDLE, DISP, 3 ML 25G X 1" 3 ML MISC 1 each by Does not apply route every 14 (fourteen) days.  50 each  3  . zolpidem (AMBIEN) 5 MG tablet Take 1 tablet (5 mg total) by mouth at bedtime as needed for sleep.  30 tablet  2   No current facility-administered medications on file prior to visit.    BP 130/60  Pulse 60  Temp(Src) 98.4 F (36.9 C) (Oral)  Resp 18  Wt 174 lb (78.926 kg)  BMI 24.97 kg/m2  SpO2 98%       Review of Systems  Constitutional: Negative for fever, chills, appetite change and fatigue.  HENT: Negative for hearing loss, ear pain, congestion, sore throat, trouble swallowing, neck stiffness, dental problem, voice change and tinnitus.   Eyes: Negative for pain, discharge and visual disturbance.  Respiratory: Negative for cough, chest tightness, wheezing and stridor.   Cardiovascular: Negative for chest pain, palpitations and leg swelling.  Gastrointestinal: Negative for nausea, vomiting, abdominal pain, diarrhea, constipation, blood in stool and  abdominal distention.  Genitourinary: Negative for urgency, hematuria, flank pain, discharge, difficulty urinating and genital sores.  Musculoskeletal: Negative for myalgias, back pain, joint swelling, arthralgias and gait problem.  Skin: Negative for rash.  Neurological: Negative for dizziness, syncope, speech difficulty, weakness, numbness and headaches.  Hematological: Negative for adenopathy. Does not bruise/bleed easily.  Psychiatric/Behavioral: Negative for behavioral problems and dysphoric mood. The patient is not nervous/anxious.        Objective:   Physical Exam  Constitutional: He is oriented to person, place, and time. He appears well-developed.  Blood pressure 130/60  HENT:  Head: Normocephalic.  Right Ear: External ear normal.  Left Ear: External ear normal.  Eyes: Conjunctivae and EOM are normal.  Neck: Normal range of motion.  Cardiovascular: Normal rate and normal heart sounds.   Pulmonary/Chest: Breath sounds normal.  Abdominal: Bowel sounds are normal.  Musculoskeletal: Normal range of motion. He exhibits no edema and no tenderness.  Neurological: He is alert and oriented to person, place, and time.  Psychiatric: He has a normal mood and affect. His behavior is normal.          Assessment & Plan:   Diabetes mellitus. Will check a hemoglobin A1c. Also will be much improved on insulin therapy Hypertension stable Coronary artery disease stable Chronic kidney disease  Recheck 3 months

## 2012-07-24 NOTE — Patient Instructions (Signed)
Please check your hemoglobin A1c every 3 months    It is important that you exercise regularly, at least 20 minutes 3 to 4 times per week.  If you develop chest pain or shortness of breath seek  medical attention.  Limit your sodium (Salt) intake   

## 2012-08-09 ENCOUNTER — Other Ambulatory Visit: Payer: Self-pay | Admitting: Internal Medicine

## 2012-08-14 ENCOUNTER — Other Ambulatory Visit: Payer: Self-pay | Admitting: *Deleted

## 2012-08-14 MED ORDER — INSULIN LISPRO 100 UNIT/ML (KWIKPEN): 6.0000 [IU] | PEN_INJECTOR | Freq: Every day | SUBCUTANEOUS | Status: DC

## 2012-08-17 ENCOUNTER — Other Ambulatory Visit: Payer: Self-pay | Admitting: *Deleted

## 2012-08-17 MED ORDER — SIMVASTATIN 20 MG PO TABS: ORAL_TABLET | ORAL | Status: DC

## 2012-08-17 NOTE — Telephone Encounter (Signed)
Pt called left message needs refill sent to [facility] for Simvastatin. Pt notified Rx sent.

## 2012-08-20 ENCOUNTER — Other Ambulatory Visit: Payer: Self-pay | Admitting: *Deleted

## 2012-08-20 MED ORDER — INSULIN PEN NEEDLE 32G X 4 MM MISC: 1.0000 | Freq: Two times a day (BID) | Status: DC | PRN

## 2012-10-24 ENCOUNTER — Ambulatory Visit (INDEPENDENT_AMBULATORY_CARE_PROVIDER_SITE_OTHER): Payer: Medicare Other | Admitting: Internal Medicine

## 2012-10-24 ENCOUNTER — Encounter: Payer: Self-pay | Admitting: Internal Medicine

## 2012-10-24 VITALS — BP 120/72 | HR 61 | Temp 98.0°F | Resp 20 | Wt 170.0 lb

## 2012-10-24 DIAGNOSIS — I739 Peripheral vascular disease, unspecified: Secondary | ICD-10-CM

## 2012-10-24 DIAGNOSIS — E119 Type 2 diabetes mellitus without complications: Secondary | ICD-10-CM

## 2012-10-24 DIAGNOSIS — N259 Disorder resulting from impaired renal tubular function, unspecified: Secondary | ICD-10-CM

## 2012-10-24 DIAGNOSIS — I10 Essential (primary) hypertension: Secondary | ICD-10-CM

## 2012-10-24 LAB — HEMOGLOBIN A1C: Hgb A1c MFr Bld: 7.8 % — ABNORMAL HIGH (ref 4.6–6.5)

## 2012-10-24 NOTE — Patient Instructions (Signed)
Please check your hemoglobin A1c every 3 months  Limit your sodium (Salt) intake    It is important that you exercise regularly, at least 20 minutes 3 to 4 times per week.  If you develop chest pain or shortness of breath seek  medical attention.   

## 2012-10-24 NOTE — Progress Notes (Signed)
Subjective:    Patient ID: [name], male    DOB: 07-Dec-1931, 79 y.o.   MRN: [id number]  HPI  79 year old patient who is seen today for followup of diabetes. He is on Lantus 14 units at bedtime and Humalog prior to meals twice daily. He has coronary artery disease hypertension and dyslipidemia. He has CAD which has been stable. No new concerns or complaints He had a diabetic eye examination 2 months ago  Past Medical History  Diagnosis Date  . CAD (coronary artery disease)   . Diabetes mellitus   . Hyperlipidemia   . Hypertension   . Myocardial infarction   . Renal insufficiency   . Gastric ulcer     History   Social History  . Marital Status: Widowed    Spouse Name: N/A    Number of Children: N/A  . Years of Education: N/A   Occupational History  . Not on file.   Social History Main Topics  . Smoking status: Former Smoker    Quit date: 02/28/1985  . Smokeless tobacco: Never Used  . Alcohol Use: No  . Drug Use: No  . Sexual Activity: Not Currently   Other Topics Concern  . Not on file   Social History Narrative  . No narrative on file    Past Surgical History  Procedure Laterality Date  . Coronary artery bypass graft  2003  . Esophagogastroduodenoscopy  06/10/2011    Procedure: ESOPHAGOGASTRODUODENOSCOPY (EGD);  Surgeon: Ladene Artist, MD,FACG;  Location: [facility] ENDOSCOPY;  Service: Endoscopy;  Laterality: N/A;    Family History  Problem Relation Age of Onset  . Diabetes      brothers and sisters  . Hypertension    . Coronary artery disease    . Stroke Mother   . Hypertension Mother   . Cancer Mother     No Known Allergies  Current Outpatient Prescriptions on File Prior to Visit  Medication Sig Dispense Refill  . amLODipine (NORVASC) 10 MG tablet Take 1 tablet (10 mg total) by mouth daily.  90 tablet  1  . aspirin 81 MG tablet Take 81 mg by mouth daily.      . benazepril (LOTENSIN) 40 MG tablet Take 40 mg by mouth daily.      . cloNIDine  (CATAPRES) 0.1 MG tablet TAKE 1 TABLET (0.1 MG TOTAL)  BY MOUTH AT BEDTIME.  90 tablet  3  . cyanocobalamin (,VITAMIN B-12,) 1000 MCG/ML injection INJECT 1 ML (1,000 MCG TOTAL) INTO THE MUSCLE EVERY  14 (FOURTEEN)DAYS .  2 mL  4  . furosemide (LASIX) 40 MG tablet TAKE ONE TABLET BY MOUTH DAILY  90 tablet  3  . furosemide (LASIX) 40 MG tablet TAKE ONE TABLET BY MOUTH ONE TIME DAILY  90 tablet  1  . insulin glargine (LANTUS SOLOSTAR) 100 UNIT/ML injection Inject 0.14 mLs (14 Units total) into the skin at bedtime.  5 pen  PRN  . Insulin Pen Needle (RELION PEN NEEDLES) 32G X 4 MM MISC 1 each by Does not apply route 2 (two) times daily as needed.  200 each  4  . labetalol (NORMODYNE) 200 MG tablet TAKE 1 TABLET (200 MG TOTAL)  BY MOUTH 2 (TWO) TIMES DAILY.  180 tablet  3  . Multiple Vitamin (MULTIVITAMIN) tablet Take 1 tablet by mouth daily.      . pentoxifylline (TRENTAL) 400 MG CR tablet TAKE 1 TABLET (400 MG TOTAL)  BY MOUTH 2 (TWO) TIMES DAILY AT 10 AMAND  5 PM.  180 tablet  3  . simvastatin (ZOCOR) 20 MG tablet TAKE 1 TABLET (20 MG TOTAL) BY MOUTH AT BEDTIME.  90 tablet  3  . SYRINGE-NEEDLE, DISP, 3 ML 25G X 1" 3 ML MISC 1 each by Does not apply route every 14 (fourteen) days.  50 each  3  . timolol (TIMOPTIC) 0.5 % ophthalmic solution       . zolpidem (AMBIEN) 5 MG tablet Take 1 tablet (5 mg total) by mouth at bedtime as needed for sleep.  30 tablet  2   No current facility-administered medications on file prior to visit.    BP 120/72  Pulse 61  Temp(Src) 98 F (36.7 C) (Oral)  Resp 20  Wt 170 lb (77.111 kg)  BMI 24.39 kg/m2  SpO2 97%       Review of Systems  Constitutional: Negative for fever, chills, appetite change and fatigue.  HENT: Negative for hearing loss, ear pain, congestion, sore throat, trouble swallowing, neck stiffness, dental problem, voice change and tinnitus.   Eyes: Negative for pain, discharge and visual disturbance.  Respiratory: Negative for cough, chest  tightness, wheezing and stridor.   Cardiovascular: Negative for chest pain, palpitations and leg swelling.  Gastrointestinal: Negative for nausea, vomiting, abdominal pain, diarrhea, constipation, blood in stool and abdominal distention.  Genitourinary: Negative for urgency, hematuria, flank pain, discharge, difficulty urinating and genital sores.  Musculoskeletal: Negative for myalgias, back pain, joint swelling, arthralgias and gait problem.  Skin: Negative for rash.  Neurological: Negative for dizziness, syncope, speech difficulty, weakness, numbness and headaches.  Hematological: Negative for adenopathy. Does not bruise/bleed easily.  Psychiatric/Behavioral: Negative for behavioral problems and dysphoric mood. The patient is not nervous/anxious.        Objective:   Physical Exam  Constitutional: He is oriented to person, place, and time. He appears well-developed.  HENT:  Head: Normocephalic.  Right Ear: External ear normal.  Left Ear: External ear normal.  Eyes: Conjunctivae and EOM are normal.  Neck: Normal range of motion.  Cardiovascular: Normal rate and normal heart sounds.   Pulmonary/Chest: Breath sounds normal.  Abdominal: Bowel sounds are normal.  Musculoskeletal: Normal range of motion. He exhibits no edema and no tenderness.  Neurological: He is alert and oriented to person, place, and time.  Psychiatric: He has a normal mood and affect. His behavior is normal.          Assessment & Plan:  Diabetes mellitus. Will check a hemoglobin A1c. Continue basal and mealtime insulin Hypertension well controlled CAD stable   cpx 3 mon

## 2012-10-27 ENCOUNTER — Other Ambulatory Visit: Payer: Self-pay | Admitting: Internal Medicine

## 2012-11-06 ENCOUNTER — Other Ambulatory Visit: Payer: Self-pay | Admitting: Internal Medicine

## 2012-12-05 ENCOUNTER — Telehealth: Payer: Self-pay | Admitting: Internal Medicine

## 2012-12-05 MED ORDER — BENAZEPRIL HCL 40 MG PO TABS: 40.0000 mg | ORAL_TABLET | Freq: Every day | ORAL | Status: DC

## 2012-12-05 NOTE — Telephone Encounter (Signed)
Pt notified Rx was sent to [facility] as requested.

## 2012-12-05 NOTE — Telephone Encounter (Signed)
Pt needs new rx on benazepril 40mg  #90 with 3 refills sent to  [facility]

## 2013-01-09 ENCOUNTER — Other Ambulatory Visit: Payer: Self-pay | Admitting: *Deleted

## 2013-01-09 MED ORDER — INSULIN ASPART 100 UNIT/ML FLEXPEN: 6.0000 [IU] | PEN_INJECTOR | Freq: Three times a day (TID) | SUBCUTANEOUS | Status: DC

## 2013-01-22 ENCOUNTER — Other Ambulatory Visit (INDEPENDENT_AMBULATORY_CARE_PROVIDER_SITE_OTHER): Payer: Medicare Other

## 2013-01-22 DIAGNOSIS — Z0000 Encounter for general adult medical examination without abnormal findings: Secondary | ICD-10-CM

## 2013-01-22 DIAGNOSIS — E119 Type 2 diabetes mellitus without complications: Secondary | ICD-10-CM

## 2013-01-22 DIAGNOSIS — E785 Hyperlipidemia, unspecified: Secondary | ICD-10-CM

## 2013-01-22 DIAGNOSIS — N400 Enlarged prostate without lower urinary tract symptoms: Secondary | ICD-10-CM

## 2013-01-22 DIAGNOSIS — I10 Essential (primary) hypertension: Secondary | ICD-10-CM

## 2013-01-22 LAB — BASIC METABOLIC PANEL
BUN: 25 mg/dL — ABNORMAL HIGH (ref 6–23)
CO2: 26 mEq/L (ref 19–32)
Calcium: 9.5 mg/dL (ref 8.4–10.5)
Chloride: 107 mEq/L (ref 96–112)
Creatinine, Ser: 2.4 mg/dL — ABNORMAL HIGH (ref 0.4–1.5)
Potassium: 4.3 mEq/L (ref 3.5–5.1)

## 2013-01-22 LAB — MICROALBUMIN / CREATININE URINE RATIO
Creatinine,U: 164.3 mg/dL
Microalb Creat Ratio: 151.4 mg/g — ABNORMAL HIGH (ref 0.0–30.0)
Microalb, Ur: 248.7 mg/dL — ABNORMAL HIGH (ref 0.0–1.9)

## 2013-01-22 LAB — HEPATIC FUNCTION PANEL
Albumin: 3.9 g/dL (ref 3.5–5.2)
Bilirubin, Direct: 0.1 mg/dL (ref 0.0–0.3)
Total Bilirubin: 0.4 mg/dL (ref 0.3–1.2)

## 2013-01-22 LAB — CBC WITH DIFFERENTIAL/PLATELET
Basophils Absolute: 0 10*3/uL (ref 0.0–0.1)
Basophils Relative: 0.7 % (ref 0.0–3.0)
Eosinophils Absolute: 0.3 10*3/uL (ref 0.0–0.7)
HCT: 40.5 % (ref 39.0–52.0)
Hemoglobin: 13.5 g/dL (ref 13.0–17.0)
Lymphocytes Relative: 22 % (ref 12.0–46.0)
Lymphs Abs: 1.1 10*3/uL (ref 0.7–4.0)
MCHC: 33.3 g/dL (ref 30.0–36.0)
MCV: 92.2 fl (ref 78.0–100.0)
Neutro Abs: 3.2 10*3/uL (ref 1.4–7.7)
Platelets: 237 10*3/uL (ref 150.0–400.0)
RBC: 4.39 Mil/uL (ref 4.22–5.81)
RDW: 13.9 % (ref 11.5–14.6)

## 2013-01-22 LAB — POCT URINALYSIS DIPSTICK
Bilirubin, UA: NEGATIVE
Glucose, UA: NEGATIVE
Ketones, UA: NEGATIVE
Leukocytes, UA: NEGATIVE
Nitrite, UA: NEGATIVE
pH, UA: 5.5

## 2013-01-22 LAB — PSA: PSA: 1.12 ng/mL (ref 0.10–4.00)

## 2013-01-22 LAB — LIPID PANEL
Cholesterol: 172 mg/dL (ref 0–200)
Total CHOL/HDL Ratio: 5
Triglycerides: 288 mg/dL — ABNORMAL HIGH (ref 0.0–149.0)

## 2013-01-22 LAB — TSH: TSH: 3.03 u[IU]/mL (ref 0.35–5.50)

## 2013-01-22 LAB — HEMOGLOBIN A1C: Hgb A1c MFr Bld: 8 % — ABNORMAL HIGH (ref 4.6–6.5)

## 2013-01-28 ENCOUNTER — Ambulatory Visit (INDEPENDENT_AMBULATORY_CARE_PROVIDER_SITE_OTHER): Payer: Medicare Other | Admitting: Internal Medicine

## 2013-01-28 ENCOUNTER — Encounter: Payer: Self-pay | Admitting: Internal Medicine

## 2013-01-28 VITALS — BP 140/70 | HR 71 | Temp 97.9°F | Resp 20 | Ht 70.0 in | Wt 175.0 lb

## 2013-01-28 DIAGNOSIS — Z0000 Encounter for general adult medical examination without abnormal findings: Secondary | ICD-10-CM

## 2013-01-28 DIAGNOSIS — I252 Old myocardial infarction: Secondary | ICD-10-CM

## 2013-01-28 DIAGNOSIS — E785 Hyperlipidemia, unspecified: Secondary | ICD-10-CM

## 2013-01-28 DIAGNOSIS — I2510 Atherosclerotic heart disease of native coronary artery without angina pectoris: Secondary | ICD-10-CM

## 2013-01-28 DIAGNOSIS — E119 Type 2 diabetes mellitus without complications: Secondary | ICD-10-CM

## 2013-01-28 DIAGNOSIS — Z23 Encounter for immunization: Secondary | ICD-10-CM

## 2013-01-28 DIAGNOSIS — I10 Essential (primary) hypertension: Secondary | ICD-10-CM

## 2013-01-28 DIAGNOSIS — I739 Peripheral vascular disease, unspecified: Secondary | ICD-10-CM

## 2013-01-28 NOTE — Patient Instructions (Signed)
Use mealtime insulin prior to each meal 2 or 3 times daily   Please check your hemoglobin A1c every 3 months    It is important that you exercise regularly, at least 20 minutes 3 to 4 times per week.  If you develop chest pain or shortness of breath seek  medical attention.

## 2013-01-28 NOTE — Progress Notes (Signed)
Pre-visit discussion using our clinic review tool. No additional management support is needed unless otherwise documented below in the visit note.  

## 2013-01-28 NOTE — Progress Notes (Signed)
Subjective:    Patient ID: [name], male    DOB: 1931/05/17, 79 y.o.   MRN: [id number]  HPI 83 -year-old patient who was seen today for an annual health examination. He has a history of coronary artery disease status post CABG in 2003; he has been seen by [facility] cardiology in the past he is doing quite well and has no exertional chest pain. He has stable claudication. The right leg is affected somewhat worse than the left. He is followed by renal medicine for chronic kidney disease. He has type 2 diabetes which has been under reasonable control. His last hemoglobin A1c was not well controlled. He did have a colonoscopy in March of 2010. He was hospitalized in 2013 for treatment of a gastric ulcer and secondary anemia. Presently he is on Lantus insulin 12 units at bedtime with normal fasting blood sugars. They are occasional less than 100. He is on mealtime insulin only prior to breakfast  Past Medical History  Diagnosis Date  . CAD (coronary artery disease)   . Diabetes mellitus   . Hyperlipidemia   . Hypertension   . Myocardial infarction   . Renal insufficiency   . Gastric ulcer    Past Surgical History  Procedure Laterality Date  . Coronary artery bypass graft  2003  . Esophagogastroduodenoscopy  06/10/2011    Procedure: ESOPHAGOGASTRODUODENOSCOPY (EGD);  Surgeon: Ladene Artist, MD,FACG;  Location: [facility] ENDOSCOPY;  Service: Endoscopy;  Laterality: N/A;    reports that he quit smoking about 27 years ago. He has never used smokeless tobacco. He reports that he does not drink alcohol or use illicit drugs. family history includes Cancer in his mother; Coronary artery disease in an other family member; Diabetes in an other family member; Hypertension in his mother and another family member; Stroke in his mother. No Known Allergies   1. Risk factors, based on past  M,S,F history- patient has coronary artery disease status post CABG. Risk factors include hypertension dyslipidemia  and type 2 diabetes 2.  Physical activities: No exercise limitations does have lower extremity claudication right leg greater than the left but does walk daily  3.  Depression/mood: No history depression or mood disorder 4.  Hearing: No deficits 5.  ADL's: Independent in all aspects of daily living 6.  Fall risk: Low  7.  Home safety: No problems identified  8.  Height weight, and visual acuity; height and weight stable no change in visual acuity does have annual eye examinations  9.  Counseling: Heart healthy diet more regular exercise discussed low-salt diet discussed 10. Lab orders based on risk factors: Laboratory profile including lipid panel will be reviewed  11. Referral : We'll followup with renal medicine and ophthalmology  12. Care plan: Continue aggressive risk factor modification. We'll check a lipid profile today blood pressure is under excellent control 13. Cognitive assessment: Alert and oriented with normal affect no cognitive dysfunction    Review of Systems  Constitutional: Negative for fever, chills, activity change, appetite change and fatigue.  HENT: Negative for congestion, dental problem, ear pain, hearing loss, mouth sores, rhinorrhea, sinus pressure, sneezing, tinnitus, trouble swallowing and voice change.   Eyes: Negative for photophobia, pain, redness and visual disturbance.  Respiratory: Negative for apnea, cough, choking, chest tightness, shortness of breath and wheezing.   Cardiovascular: Negative for chest pain, palpitations and leg swelling.  Gastrointestinal: Negative for nausea, vomiting, abdominal pain, diarrhea, constipation, blood in stool, abdominal distention, anal bleeding and rectal pain.  Genitourinary:  Negative for dysuria, urgency, frequency, hematuria, flank pain, decreased urine volume, discharge, penile swelling, scrotal swelling, difficulty urinating, genital sores and testicular pain.  Musculoskeletal: Negative for arthralgias, back pain,  gait problem, joint swelling, myalgias, neck pain and neck stiffness.  Skin: Negative for color change, rash and wound.  Neurological: Negative for dizziness, tremors, seizures, syncope, facial asymmetry, speech difficulty, weakness, light-headedness, numbness and headaches.  Hematological: Negative for adenopathy. Does not bruise/bleed easily.  Psychiatric/Behavioral: Negative for suicidal ideas, hallucinations, behavioral problems, confusion, sleep disturbance, self-injury, dysphoric mood, decreased concentration and agitation. The patient is not nervous/anxious.        Objective:   Physical Exam  Constitutional: He appears well-developed and well-nourished.  Blood pressure 140/60 in both arms  HENT:  Head: Normocephalic and atraumatic.  Right Ear: External ear normal.  Left Ear: External ear normal.  Nose: Nose normal.  Mouth/Throat: Oropharynx is clear and moist.  Eyes: Conjunctivae and EOM are normal. Pupils are equal, round, and reactive to light. No scleral icterus.  Arcus senilis  Neck: Normal range of motion. Neck supple. No JVD present. No thyromegaly present.  Cardiovascular: Normal rate, regular rhythm and normal heart sounds.  Exam reveals no gallop and no friction rub.   No murmur heard. Pedal pulses are absent  Pulmonary/Chest: Effort normal and breath sounds normal. He exhibits no tenderness.  Sternotomy scar  Abdominal: Soft. Bowel sounds are normal. He exhibits no distension and no mass. There is no tenderness.  Bilateral femoral bruits  Genitourinary: Rectum normal and penis normal. No penile tenderness.  Prostate +2 enlarged  Musculoskeletal: Normal range of motion. He exhibits no edema and no tenderness.  Lymphadenopathy:    He has no cervical adenopathy.  Neurological: He is alert. He has normal reflexes. No cranial nerve deficit. Coordination normal.  Skin: Skin is warm and dry. No rash noted.  Psychiatric: He has a normal mood and affect. His behavior is  normal.          Assessment & Plan:   Annual health examination Coronary artery disease Diabetes mellitus type 2. We'll  add mealtime insulin prior to his evening meal;  exercise discussed encouraged Hypertension stable Chronic kidney disease. We'll check a metabolic panel Stable  claudication continue aggressive risk factor modification including statin therapy

## 2013-02-12 ENCOUNTER — Telehealth: Payer: Self-pay | Admitting: Internal Medicine

## 2013-02-12 NOTE — Telephone Encounter (Signed)
Spoke to pt told him next available appointment with Dr. Raliegh Ip is 12/29. Pt said that will be fine needs to talk to him about health condition and needs letter for [facility]. Told him okay appointment scheduled for 12/29 at 3:15 pm with Dr. Raliegh Ip. Pt verbalized understanding.

## 2013-02-12 NOTE — Telephone Encounter (Signed)
Pt needs a letter to the [place] stating about his heart condition and that its not getting any better..  Pt would liie you to call him prior to writing the letter. Pt wanted to come in next week, but no appts. Pt states the sooner the better.

## 2013-02-25 ENCOUNTER — Ambulatory Visit (INDEPENDENT_AMBULATORY_CARE_PROVIDER_SITE_OTHER): Payer: Medicare Other | Admitting: Internal Medicine

## 2013-02-25 ENCOUNTER — Encounter: Payer: Self-pay | Admitting: Internal Medicine

## 2013-02-25 VITALS — BP 160/90 | Temp 98.0°F | Wt 182.0 lb

## 2013-02-25 DIAGNOSIS — I10 Essential (primary) hypertension: Secondary | ICD-10-CM

## 2013-02-25 DIAGNOSIS — I739 Peripheral vascular disease, unspecified: Secondary | ICD-10-CM

## 2013-02-25 DIAGNOSIS — N259 Disorder resulting from impaired renal tubular function, unspecified: Secondary | ICD-10-CM

## 2013-02-25 DIAGNOSIS — E119 Type 2 diabetes mellitus without complications: Secondary | ICD-10-CM

## 2013-02-25 NOTE — Patient Instructions (Signed)
Please check your hemoglobin A1c every 3 months

## 2013-02-25 NOTE — Progress Notes (Signed)
Subjective:    Patient ID: [name], male    DOB: 28-Jun-1931, 79 y.o.   MRN: [id number]  HPI 79 year old patient has chronic medical problems include diabetes chronic kidney disease hypertension coronary artery disease as well as peripheral vascular disease. He basically is applying for disability benefits with the [employer] [facility]. He is requesting medical information to support his request for disability benefits. He has diabetes which has not been well controlled. Fasting blood sugars are generally done fairly well on Lantus 14 units at bedtime. He states that he is taking mealtime insulin only once daily and this is following a meal.    BP Readings from Last 3 Encounters:  02/25/13 160/90  01/28/13 140/70  10/24/12 120/72    Past Medical History  Diagnosis Date  . CAD (coronary artery disease)   . Diabetes mellitus   . Hyperlipidemia   . Hypertension   . Myocardial infarction   . Renal insufficiency   . Gastric ulcer     History   Social History  . Marital Status: Widowed    Spouse Name: N/A    Number of Children: N/A  . Years of Education: N/A   Occupational History  . Not on file.   Social History Main Topics  . Smoking status: Former Smoker    Quit date: 02/28/1985  . Smokeless tobacco: Never Used  . Alcohol Use: No  . Drug Use: No  . Sexual Activity: Not Currently   Other Topics Concern  . Not on file   Social History Narrative  . No narrative on file    Past Surgical History  Procedure Laterality Date  . Coronary artery bypass graft  2003  . Esophagogastroduodenoscopy  06/10/2011    Procedure: ESOPHAGOGASTRODUODENOSCOPY (EGD);  Surgeon: Ladene Artist, MD,FACG;  Location: [facility] ENDOSCOPY;  Service: Endoscopy;  Laterality: N/A;    Family History  Problem Relation Age of Onset  . Diabetes      brothers and sisters  . Hypertension    . Coronary artery disease    . Stroke Mother   . Hypertension Mother   . Cancer Mother     No Known  Allergies  Current Outpatient Prescriptions on File Prior to Visit  Medication Sig Dispense Refill  . amLODipine (NORVASC) 10 MG tablet TAKE 1 TABLET DAILY  90 tablet  1  . aspirin 81 MG tablet Take 81 mg by mouth daily.      . benazepril (LOTENSIN) 40 MG tablet Take 1 tablet (40 mg total) by mouth daily.  90 tablet  3  . cloNIDine (CATAPRES) 0.1 MG tablet TAKE 1 TABLET (0.1 MG TOTAL)  BY MOUTH AT BEDTIME.  90 tablet  3  . furosemide (LASIX) 40 MG tablet TAKE ONE TABLET BY MOUTH DAILY  90 tablet  3  . furosemide (LASIX) 40 MG tablet TAKE ONE TABLET BY MOUTH ONE TIME DAILY  90 tablet  1  . insulin aspart (NOVOLOG FLEXPEN) 100 UNIT/ML SOPN FlexPen Inject 6 Units into the skin 3 (three) times daily with meals.  6 pen  3  . insulin glargine (LANTUS SOLOSTAR) 100 UNIT/ML injection Inject 0.14 mLs (14 Units total) into the skin at bedtime.  5 pen  PRN  . Insulin Pen Needle (RELION PEN NEEDLES) 32G X 4 MM MISC 1 each by Does not apply route 2 (two) times daily as needed.  200 each  4  . labetalol (NORMODYNE) 200 MG tablet TAKE 1 TABLET (200 MG TOTAL)  BY MOUTH  2 (TWO) TIMES DAILY.  180 tablet  3  . pentoxifylline (TRENTAL) 400 MG CR tablet TAKE 1 TABLET (400 MG TOTAL)  BY MOUTH 2 (TWO) TIMES DAILY AT 10 AMAND  5 PM.  180 tablet  3  . simvastatin (ZOCOR) 20 MG tablet TAKE 1 TABLET (20 MG TOTAL) BY MOUTH AT BEDTIME.  90 tablet  3  . SYRINGE-NEEDLE, DISP, 3 ML 25G X 1" 3 ML MISC 1 each by Does not apply route every 14 (fourteen) days.  50 each  3  . timolol (TIMOPTIC) 0.5 % ophthalmic solution       . zolpidem (AMBIEN) 5 MG tablet Take 1 tablet (5 mg total) by mouth at bedtime as needed for sleep.  30 tablet  2   No current facility-administered medications on file prior to visit.    BP 160/90  Temp(Src) 98 F (36.7 C) (Oral)  Wt 182 lb (82.555 kg)        Review of Systems  Constitutional: Positive for fatigue.  Musculoskeletal: Positive for arthralgias.       Objective:   Physical Exam   Constitutional: He appears well-developed and well-nourished. No distress.  Repeat blood pressure 140/80          Assessment & Plan:  Diabetes mellitus. Patient was asked to use mealtime insulin prior to each meal 3 times daily. Continue Lantus 14 units at bedtime Hypertension Coronary artery disease PA D. Copies of medical records given to the patient for his disability review  Return in 3 months for followup

## 2013-03-22 ENCOUNTER — Other Ambulatory Visit: Payer: Self-pay | Admitting: *Deleted

## 2013-03-22 NOTE — Telephone Encounter (Signed)
Left message on voicemail to call office.  

## 2013-03-23 NOTE — Telephone Encounter (Signed)
Left message on voicemail to call office.  

## 2013-03-29 ENCOUNTER — Encounter: Payer: Self-pay | Admitting: Gastroenterology

## 2013-04-02 ENCOUNTER — Other Ambulatory Visit: Payer: Self-pay | Admitting: *Deleted

## 2013-04-02 ENCOUNTER — Encounter: Payer: Self-pay | Admitting: Gastroenterology

## 2013-04-02 MED ORDER — LABETALOL HCL 200 MG PO TABS: ORAL_TABLET | ORAL | Status: DC

## 2013-04-02 MED ORDER — FUROSEMIDE 40 MG PO TABS: ORAL_TABLET | ORAL | Status: DC

## 2013-04-02 MED ORDER — SIMVASTATIN 20 MG PO TABS: ORAL_TABLET | ORAL | Status: DC

## 2013-04-02 MED ORDER — BENAZEPRIL HCL 40 MG PO TABS: 40.0000 mg | ORAL_TABLET | Freq: Every day | ORAL | Status: DC

## 2013-04-02 MED ORDER — INSULIN GLARGINE 100 UNIT/ML SOLOSTAR PEN: 14.0000 [IU] | PEN_INJECTOR | Freq: Every day | SUBCUTANEOUS | Status: DC

## 2013-04-02 MED ORDER — CLONIDINE HCL 0.1 MG PO TABS: ORAL_TABLET | ORAL | Status: DC

## 2013-04-02 MED ORDER — AMLODIPINE BESYLATE 10 MG PO TABS: ORAL_TABLET | ORAL | Status: DC

## 2013-04-02 NOTE — Telephone Encounter (Signed)
Spoke to pt insurance changed now has [employer], needs Rx's sent to [facility]. Told him okay will send Rx's to [facility]. Rx's sent

## 2013-04-04 ENCOUNTER — Telehealth: Payer: Self-pay | Admitting: Internal Medicine

## 2013-04-04 MED ORDER — INSULIN ASPART 100 UNIT/ML FLEXPEN: 6.0000 [IU] | PEN_INJECTOR | Freq: Three times a day (TID) | SUBCUTANEOUS | Status: DC

## 2013-04-04 NOTE — Telephone Encounter (Signed)
[facility] requesting new script for insulin aspart (NOVOLOG FLEXPEN) 100 UNIT/ML SOPN FlexPen

## 2013-04-04 NOTE — Telephone Encounter (Signed)
Rx sent to [facility].

## 2013-04-22 ENCOUNTER — Other Ambulatory Visit: Payer: Self-pay | Admitting: *Deleted

## 2013-04-22 ENCOUNTER — Ambulatory Visit: Payer: Medicare Other | Admitting: Internal Medicine

## 2013-04-22 MED ORDER — PENTOXIFYLLINE ER 400 MG PO TBCR: EXTENDED_RELEASE_TABLET | ORAL | Status: DC

## 2013-04-22 MED ORDER — ZOLPIDEM TARTRATE 5 MG PO TABS: 5.0000 mg | ORAL_TABLET | Freq: Every evening | ORAL | Status: DC | PRN

## 2013-04-22 NOTE — Telephone Encounter (Signed)
Rx for Zolpidem faxed to [facility].

## 2013-05-01 ENCOUNTER — Ambulatory Visit: Payer: Medicare Other | Admitting: Internal Medicine

## 2013-05-03 ENCOUNTER — Ambulatory Visit (INDEPENDENT_AMBULATORY_CARE_PROVIDER_SITE_OTHER): Payer: Commercial Managed Care - HMO | Admitting: Gastroenterology

## 2013-05-03 ENCOUNTER — Encounter: Payer: Self-pay | Admitting: Gastroenterology

## 2013-05-03 VITALS — BP 148/82 | HR 88 | Ht 70.0 in | Wt 181.0 lb

## 2013-05-03 DIAGNOSIS — D126 Benign neoplasm of colon, unspecified: Secondary | ICD-10-CM

## 2013-05-03 NOTE — Patient Instructions (Signed)
Follow up as needed

## 2013-05-03 NOTE — Progress Notes (Signed)
_                                                                                                                History of Present Illness: 80 year old African American male with history of coronary artery disease, diabetes, hypertension, renal sufficiency, and bleeding gastric ulcer referred for screening colonoscopy.  Last colonoscopy exam in 2010 demonstrated an adenomatous appearing polyp (polyp was not retrieved) and a segmental area of mild colitis. The patient has no GI complaints including change of bowel habits, abdominal pain, melena or hematochezia.  Other medical problems are stable.    Past Medical History  Diagnosis Date  . CAD (coronary artery disease)   . Diabetes mellitus   . Hyperlipidemia   . Hypertension   . Myocardial infarction   . Renal insufficiency   . Gastric ulcer    Past Surgical History  Procedure Laterality Date  . Coronary artery bypass graft  2003  . Esophagogastroduodenoscopy  06/10/2011    Procedure: ESOPHAGOGASTRODUODENOSCOPY (EGD);  Surgeon: Ladene Artist, MD,FACG;  Location: [facility] ENDOSCOPY;  Service: Endoscopy;  Laterality: N/A;   family history includes Cancer in his mother; Coronary artery disease in an other family member; Diabetes in an other family member; Hypertension in his mother and another family member; Stroke in his mother. Current Outpatient Prescriptions  Medication Sig Dispense Refill  . amLODipine (NORVASC) 10 MG tablet TAKE 1 TABLET DAILY  90 tablet  3  . aspirin 81 MG tablet Take 81 mg by mouth daily.      . benazepril (LOTENSIN) 40 MG tablet Take 1 tablet (40 mg total) by mouth daily.  90 tablet  3  . cloNIDine (CATAPRES) 0.1 MG tablet TAKE 1 TABLET (0.1 MG TOTAL)  BY MOUTH AT BEDTIME.  90 tablet  3  . furosemide (LASIX) 40 MG tablet TAKE ONE TABLET BY MOUTH DAILY  90 tablet  3  . insulin aspart (NOVOLOG FLEXPEN) 100 UNIT/ML FlexPen Inject 6 Units into the skin 3 (three) times daily with meals.  6 pen  3  .  insulin glargine (LANTUS SOLOSTAR) 100 UNIT/ML injection Inject 0.14 mLs (14 Units total) into the skin at bedtime.  5 pen  PRN  . Insulin Glargine (LANTUS) 100 UNIT/ML Solostar Pen Inject 14 Units into the skin daily at 10 pm.  5 pen  3  . Insulin Pen Needle (RELION PEN NEEDLES) 32G X 4 MM MISC 1 each by Does not apply route 2 (two) times daily as needed.  200 each  4  . labetalol (NORMODYNE) 200 MG tablet TAKE 1 TABLET (200 MG TOTAL)  BY MOUTH 2 (TWO) TIMES DAILY.  180 tablet  3  . pentoxifylline (TRENTAL) 400 MG CR tablet TAKE 1 TABLET (400 MG TOTAL)  BY MOUTH 2 (TWO) TIMES DAILY AT 10 AMAND  5 PM.  180 tablet  3  . simvastatin (ZOCOR) 20 MG tablet TAKE 1 TABLET (20 MG TOTAL) BY MOUTH AT BEDTIME.  90 tablet  3  . SYRINGE-NEEDLE, DISP, 3  ML 25G X 1" 3 ML MISC 1 each by Does not apply route every 14 (fourteen) days.  50 each  3  . timolol (TIMOPTIC) 0.5 % ophthalmic solution       . zolpidem (AMBIEN) 5 MG tablet Take 1 tablet (5 mg total) by mouth at bedtime as needed for sleep.  90 tablet  1   No current facility-administered medications for this visit.   Allergies as of 05/03/2013  . (No Known Allergies)    reports that he quit smoking about 60 years ago. He has never used smokeless tobacco. He reports that he does not drink alcohol or use illicit drugs.     Review of Systems: Pertinent positive and negative review of systems were noted in the above HPI section. All other review of systems were otherwise negative.  Vital signs were reviewed in today's medical record Physical Exam: General: Well developed , well nourished, no acute distress Skin: anicteric Head: Normocephalic and atraumatic Eyes:  sclerae anicteric, EOMI Ears: Normal auditory acuity Mouth: No deformity or lesions Neck: Supple, no masses or thyromegaly Lungs: Clear throughout to auscultation Heart: Regular rate and rhythm; no murmurs, rubs or bruits Abdomen: Soft, non tender and non distended. No masses,  hepatosplenomegaly or hernias noted. Normal Bowel sounds Rectal:deferred Musculoskeletal: Symmetrical with no gross deformities  Skin: No lesions on visible extremities Pulses:  Normal pulses noted Extremities: No clubbing, cyanosis, edema or deformities noted Neurological: Alert oriented x 4, grossly nonfocal Cervical Nodes:  No significant cervical adenopathy Inguinal Nodes: No significant inguinal adenopathy Psychological:  Alert and cooperative. Normal mood and affect  See Assessment and Plan under Problem List

## 2013-05-03 NOTE — Assessment & Plan Note (Signed)
History of adenomatous appearing polyp in 2010 (polyp was not successfully retrieved).  The patient is asymptomatic.  We discussed options including followup colonoscopy, stool examination with Cologuard (for colonic neoplasm), and no further testing.  I explained to him that we generally stop doing routine examinations at age 78-80.  The patient does not want to proceed with colonoscopy at this time and he is not interested in a submitting stool samples.  Accordingly, no further testing will be scheduled.

## 2013-05-27 ENCOUNTER — Ambulatory Visit (INDEPENDENT_AMBULATORY_CARE_PROVIDER_SITE_OTHER): Payer: Commercial Managed Care - HMO | Admitting: Internal Medicine

## 2013-05-27 ENCOUNTER — Encounter: Payer: Self-pay | Admitting: Internal Medicine

## 2013-05-27 VITALS — BP 150/80 | HR 64 | Temp 97.8°F | Resp 20 | Ht 70.0 in | Wt 182.0 lb

## 2013-05-27 DIAGNOSIS — I2510 Atherosclerotic heart disease of native coronary artery without angina pectoris: Secondary | ICD-10-CM

## 2013-05-27 DIAGNOSIS — E119 Type 2 diabetes mellitus without complications: Secondary | ICD-10-CM

## 2013-05-27 DIAGNOSIS — I739 Peripheral vascular disease, unspecified: Secondary | ICD-10-CM

## 2013-05-27 DIAGNOSIS — I10 Essential (primary) hypertension: Secondary | ICD-10-CM

## 2013-05-27 DIAGNOSIS — E538 Deficiency of other specified B group vitamins: Secondary | ICD-10-CM

## 2013-05-27 DIAGNOSIS — N259 Disorder resulting from impaired renal tubular function, unspecified: Secondary | ICD-10-CM

## 2013-05-27 LAB — HEMOGLOBIN A1C: Hgb A1c MFr Bld: 8.5 % — ABNORMAL HIGH (ref 4.6–6.5)

## 2013-05-27 NOTE — Progress Notes (Signed)
Subjective:    Patient ID: [name], male    DOB: March 14, 1931, 80 y.o.   MRN: [id number]  HPI  80 year old patient who is seen today for followup.  He has a history of type 2 diabetes insulin requiring.  His last hemoglobin A1c 8 point 0.  His mealtime insulin was adjusted.  Presently, he is on Lantus insulin 14 units at bedtime with fasting blood sugars often less than 100.  He is now on mealtime insulin prior to each meal 3 times daily.  He uses 60 units.  He has a light lunch, and a full breakfast and evening meal. He has coronary artery disease, which has been stable.  No chest pain.  He does have a history of PID, but denies any claudication.  He has had a recent eye exam within the past year.  Past Medical History  Diagnosis Date  . CAD (coronary artery disease)   . Diabetes mellitus   . Hyperlipidemia   . Hypertension   . Myocardial infarction   . Renal insufficiency   . Gastric ulcer     History   Social History  . Marital Status: Widowed    Spouse Name: N/A    Number of Children: N/A  . Years of Education: N/A   Occupational History  . Not on file.   Social History Main Topics  . Smoking status: Former Smoker    Quit date: 02/28/1953  . Smokeless tobacco: Never Used  . Alcohol Use: No  . Drug Use: No  . Sexual Activity: Not Currently   Other Topics Concern  . Not on file   Social History Narrative  . No narrative on file    Past Surgical History  Procedure Laterality Date  . Coronary artery bypass graft  2003  . Esophagogastroduodenoscopy  06/10/2011    Procedure: ESOPHAGOGASTRODUODENOSCOPY (EGD);  Surgeon: Ladene Artist, MD,FACG;  Location: [facility] ENDOSCOPY;  Service: Endoscopy;  Laterality: N/A;    Family History  Problem Relation Age of Onset  . Diabetes      brothers and sisters  . Hypertension    . Coronary artery disease    . Stroke Mother   . Hypertension Mother   . Cancer Mother     No Known Allergies  Current Outpatient  Prescriptions on File Prior to Visit  Medication Sig Dispense Refill  . amLODipine (NORVASC) 10 MG tablet TAKE 1 TABLET DAILY  90 tablet  3  . aspirin 81 MG tablet Take 81 mg by mouth daily.      . benazepril (LOTENSIN) 40 MG tablet Take 1 tablet (40 mg total) by mouth daily.  90 tablet  3  . cloNIDine (CATAPRES) 0.1 MG tablet TAKE 1 TABLET (0.1 MG TOTAL)  BY MOUTH AT BEDTIME.  90 tablet  3  . furosemide (LASIX) 40 MG tablet TAKE ONE TABLET BY MOUTH DAILY  90 tablet  3  . insulin aspart (NOVOLOG FLEXPEN) 100 UNIT/ML FlexPen Inject 6 Units into the skin 3 (three) times daily with meals.  6 pen  3  . Insulin Glargine (LANTUS) 100 UNIT/ML Solostar Pen Inject 14 Units into the skin daily at 10 pm.  5 pen  3  . Insulin Pen Needle (RELION PEN NEEDLES) 32G X 4 MM MISC 1 each by Does not apply route 2 (two) times daily as needed.  200 each  4  . labetalol (NORMODYNE) 200 MG tablet TAKE 1 TABLET (200 MG TOTAL)  BY MOUTH 2 (TWO) TIMES DAILY.  180 tablet  3  . pentoxifylline (TRENTAL) 400 MG CR tablet TAKE 1 TABLET (400 MG TOTAL)  BY MOUTH 2 (TWO) TIMES DAILY AT 10 AMAND  5 PM.  180 tablet  3  . simvastatin (ZOCOR) 20 MG tablet TAKE 1 TABLET (20 MG TOTAL) BY MOUTH AT BEDTIME.  90 tablet  3  . SYRINGE-NEEDLE, DISP, 3 ML 25G X 1" 3 ML MISC 1 each by Does not apply route every 14 (fourteen) days.  50 each  3  . timolol (TIMOPTIC) 0.5 % ophthalmic solution Place 1 drop into both eyes 2 (two) times daily.       Marland Kitchen zolpidem (AMBIEN) 5 MG tablet Take 1 tablet (5 mg total) by mouth at bedtime as needed for sleep.  90 tablet  1   No current facility-administered medications on file prior to visit.    BP 150/80  Pulse 64  Temp(Src) 97.8 F (36.6 C) (Oral)  Resp 20  Ht 5\' 10"  (1.778 m)  Wt 182 lb (82.555 kg)  BMI 26.11 kg/m2  SpO2 99%        Review of Systems  Constitutional: Negative for fever, chills, appetite change and fatigue.  HENT: Negative for congestion, dental problem, ear pain, hearing  loss, sore throat, tinnitus, trouble swallowing and voice change.   Eyes: Negative for pain, discharge and visual disturbance.  Respiratory: Negative for cough, chest tightness, wheezing and stridor.   Cardiovascular: Negative for chest pain, palpitations and leg swelling.  Gastrointestinal: Negative for nausea, vomiting, abdominal pain, diarrhea, constipation, blood in stool and abdominal distention.  Genitourinary: Negative for urgency, hematuria, flank pain, discharge, difficulty urinating and genital sores.  Musculoskeletal: Negative for arthralgias, back pain, gait problem, joint swelling, myalgias and neck stiffness.  Skin: Negative for rash.  Neurological: Negative for dizziness, syncope, speech difficulty, weakness, numbness and headaches.  Hematological: Negative for adenopathy. Does not bruise/bleed easily.  Psychiatric/Behavioral: Negative for behavioral problems and dysphoric mood. The patient is not nervous/anxious.        Objective:   Physical Exam  Constitutional: He is oriented to person, place, and time. He appears well-developed.  HENT:  Head: Normocephalic.  Right Ear: External ear normal.  Left Ear: External ear normal.  Eyes: Conjunctivae and EOM are normal.  Neck: Normal range of motion.  Cardiovascular: Normal rate and normal heart sounds.   Pulmonary/Chest: Breath sounds normal.  Abdominal: Bowel sounds are normal.  Musculoskeletal: Normal range of motion. He exhibits no edema and no tenderness.  Neurological: He is alert and oriented to person, place, and time.  Psychiatric: He has a normal mood and affect. His behavior is normal.          Assessment & Plan:  Diabetes mellitus.  We'll check a hemoglobin A1c Hypertension controlled Dyslipidemia CAD B12 deficiency  Samples provided.  Check hemoglobin A 1 C. Low-salt diet, exercise regimen.  Encouraged  Recheck 3-4 months

## 2013-05-27 NOTE — Progress Notes (Signed)
Pre-visit discussion using our clinic review tool. No additional management support is needed unless otherwise documented below in the visit note.  

## 2013-05-27 NOTE — Progress Notes (Signed)
   Subjective:    Patient ID: [name], male    DOB: April 18, 1931, 80 y.o.   MRN: [id number]  HPI Wt Readings from Last 3 Encounters:  05/27/13 182 lb (82.555 kg)  05/03/13 181 lb (82.101 kg)  02/25/13 182 lb (82.555 kg)     Review of Systems     Objective:   Physical Exam        Assessment & Plan:

## 2013-05-27 NOTE — Patient Instructions (Signed)
Please check your hemoglobin A1c every 3 months  Limit your sodium (Salt) intake    It is important that you exercise regularly, at least 20 minutes 3 to 4 times per week.  If you develop chest pain or shortness of breath seek  medical attention.   

## 2013-05-28 ENCOUNTER — Telehealth: Payer: Self-pay | Admitting: Internal Medicine

## 2013-05-28 NOTE — Telephone Encounter (Signed)
[place] requesting new scripts for the following:  Accu-Chek Aviva Solution BD Single Use Swab

## 2013-05-28 NOTE — Telephone Encounter (Signed)
Relevant patient education mailed to patient.  

## 2013-05-28 NOTE — Telephone Encounter (Signed)
Please add the following to the re-fill list:  ACCU-CHEK AVIVA PLUS METER ACCU-CHEK AVIVA PLUS TEST STRP ACCU-CHEK SOFTCLIX LANCETS

## 2013-05-29 MED ORDER — ACCU-CHEK SOFTCLIX LANCETS MISC: 1.0000 | Freq: Two times a day (BID) | Status: DC | PRN

## 2013-05-29 MED ORDER — GLUCOSE BLOOD VI STRP: 1.0000 | ORAL_STRIP | Freq: Two times a day (BID) | Status: DC | PRN

## 2013-05-29 MED ORDER — ACCU-CHEK AVIVA PLUS W/DEVICE KIT: 1.0000 | PACK | Freq: Once | Status: DC

## 2013-05-29 MED ORDER — BD SWAB SINGLE USE REGULAR PADS: MEDICATED_PAD | Status: DC

## 2013-05-29 MED ORDER — ACCU-CHEK AVIVA VI SOLN: Status: DC

## 2013-05-29 NOTE — Telephone Encounter (Signed)
Spoke to pt told him Rx for Accu-Chek meter and supplies sent to [facility]. Pt verbalized understanding.

## 2013-06-20 ENCOUNTER — Telehealth: Payer: Self-pay

## 2013-06-20 NOTE — Telephone Encounter (Signed)
Relevant patient education mailed to patient.  

## 2013-07-19 ENCOUNTER — Telehealth: Payer: Self-pay | Admitting: Internal Medicine

## 2013-07-19 MED ORDER — CYANOCOBALAMIN 1000 MCG/ML IJ SOLN: 1000.0000 ug | INTRAMUSCULAR | Status: DC

## 2013-07-19 NOTE — Telephone Encounter (Signed)
Pt needs 90 day supply of cyanocobalamine sent to [facility]. This will be new rx

## 2013-07-19 NOTE — Telephone Encounter (Signed)
Rx sent 

## 2013-07-26 ENCOUNTER — Other Ambulatory Visit: Payer: Self-pay | Admitting: Internal Medicine

## 2013-08-12 ENCOUNTER — Ambulatory Visit (INDEPENDENT_AMBULATORY_CARE_PROVIDER_SITE_OTHER): Payer: Commercial Managed Care - HMO | Admitting: Internal Medicine

## 2013-08-12 ENCOUNTER — Encounter: Payer: Self-pay | Admitting: Internal Medicine

## 2013-08-12 VITALS — BP 130/64 | Temp 98.5°F | Ht 70.0 in | Wt 178.0 lb

## 2013-08-12 DIAGNOSIS — E785 Hyperlipidemia, unspecified: Secondary | ICD-10-CM

## 2013-08-12 DIAGNOSIS — I10 Essential (primary) hypertension: Secondary | ICD-10-CM

## 2013-08-12 DIAGNOSIS — N259 Disorder resulting from impaired renal tubular function, unspecified: Secondary | ICD-10-CM

## 2013-08-12 DIAGNOSIS — I739 Peripheral vascular disease, unspecified: Secondary | ICD-10-CM

## 2013-08-12 DIAGNOSIS — I2510 Atherosclerotic heart disease of native coronary artery without angina pectoris: Secondary | ICD-10-CM

## 2013-08-12 DIAGNOSIS — E119 Type 2 diabetes mellitus without complications: Secondary | ICD-10-CM

## 2013-08-12 MED ORDER — TEMAZEPAM 15 MG PO CAPS: 15.0000 mg | ORAL_CAPSULE | Freq: Every evening | ORAL | Status: DC | PRN

## 2013-08-12 NOTE — Progress Notes (Signed)
Pre visit review using our clinic review tool, if applicable. No additional management support is needed unless otherwise documented below in the visit note. 

## 2013-08-12 NOTE — Patient Instructions (Signed)
Limit your sodium (Salt) intake   Please check your hemoglobin A1c every 3 months    It is important that you exercise regularly, at least 20 minutes 3 to 4 times per week.  If you develop chest pain or shortness of breath seek  medical attention.   

## 2013-08-12 NOTE — Progress Notes (Signed)
Subjective:    Patient ID: [name], male    DOB: [birth year], 80 y.o.   MRN: [id number]  HPI   80 year old patient is seen today for his quarterly followup.  He has type 2 diabetes.  Presently, he is on Lantus insulin 14 units at bedtime (12 units ordered) and 6-8 units of NovoLog (10-12 units ordered).  He states blood sugars are well controlled and denies any hypoglycemia.  He states blood sugars are only very rarely greater than 200.  He has not checked any recent fasting blood sugars.  Last hemoglobin A1c 8 point 5.  He states he did have a high exam 2 months ago He has coronary artery disease, which has been stable.  He is status post bypass about 12 years ago. He has dyslipidemia.  He remains on statin therapy.  He has treated hypertension and mild renal insufficiency. Denies any cardiopulmonary complaints  Past Medical History  Diagnosis Date  . CAD (coronary artery disease)   . Diabetes mellitus   . Hyperlipidemia   . Hypertension   . Myocardial infarction   . Renal insufficiency   . Gastric ulcer     History   Social History  . Marital Status: Widowed    Spouse Name: N/A    Number of Children: N/A  . Years of Education: N/A   Occupational History  . Not on file.   Social History Main Topics  . Smoking status: Former Smoker    Quit date: 02/28/1953  . Smokeless tobacco: Never Used  . Alcohol Use: No  . Drug Use: No  . Sexual Activity: Not Currently   Other Topics Concern  . Not on file   Social History Narrative  . No narrative on file    Past Surgical History  Procedure Laterality Date  . Coronary artery bypass graft  2003  . Esophagogastroduodenoscopy  06/10/2011    Procedure: ESOPHAGOGASTRODUODENOSCOPY (EGD);  Surgeon: Ladene Artist, MD,FACG;  Location: [facility] ENDOSCOPY;  Service: Endoscopy;  Laterality: N/A;    Family History  Problem Relation Age of Onset  . Diabetes      brothers and sisters  . Hypertension    . Coronary artery  disease    . Stroke Mother   . Hypertension Mother   . Cancer Mother     No Known Allergies  Current Outpatient Prescriptions on File Prior to Visit  Medication Sig Dispense Refill  . ACCU-CHEK SOFTCLIX LANCETS lancets 1 each by Other route 2 (two) times daily as needed for other.  200 each  12  . Alcohol Swabs (B-D SINGLE USE SWABS REGULAR) PADS USE AS DIRECTED  100 each  12  . amLODipine (NORVASC) 10 MG tablet TAKE 1 TABLET DAILY  90 tablet  3  . aspirin 81 MG tablet Take 81 mg by mouth daily.      . benazepril (LOTENSIN) 40 MG tablet Take 1 tablet (40 mg total) by mouth daily.  90 tablet  3  . Blood Glucose Calibration (ACCU-CHEK AVIVA) SOLN USE AS DIRECTED  3 each  3  . Blood Glucose Monitoring Suppl (ACCU-CHEK AVIVA PLUS) W/DEVICE KIT USE AS DIRECTED  1 kit  0  . cloNIDine (CATAPRES) 0.1 MG tablet TAKE 1 TABLET (0.1 MG TOTAL)  BY MOUTH AT BEDTIME.  90 tablet  3  . cyanocobalamin (,VITAMIN B-12,) 1000 MCG/ML injection Inject 1 mL (1,000 mcg total) into the skin every 30 (thirty) days.  30 mL  3  . glucose blood (ACCU-CHEK  AVIVA PLUS) test strip 1 each by Other route 2 (two) times daily as needed for other.  100 each  12  . insulin aspart (NOVOLOG FLEXPEN) 100 UNIT/ML FlexPen Inject 6 Units into the skin 3 (three) times daily with meals.  6 pen  3  . Insulin Glargine (LANTUS) 100 UNIT/ML Solostar Pen Inject 14 Units into the skin daily at 10 pm.  5 pen  3  . Insulin Pen Needle (RELION PEN NEEDLES) 32G X 4 MM MISC 1 each by Does not apply route 2 (two) times daily as needed.  200 each  4  . labetalol (NORMODYNE) 200 MG tablet TAKE 1 TABLET (200 MG TOTAL)  BY MOUTH 2 (TWO) TIMES DAILY.  180 tablet  3  . pentoxifylline (TRENTAL) 400 MG CR tablet TAKE 1 TABLET (400 MG TOTAL)  BY MOUTH 2 (TWO) TIMES DAILY AT 10 AMAND  5 PM.  180 tablet  3  . simvastatin (ZOCOR) 20 MG tablet TAKE 1 TABLET (20 MG TOTAL) BY MOUTH AT BEDTIME.  90 tablet  3  . SYRINGE-NEEDLE, DISP, 3 ML 25G X 1" 3 ML MISC 1 each  by Does not apply route every 14 (fourteen) days.  50 each  3  . timolol (TIMOPTIC) 0.5 % ophthalmic solution Place 1 drop into both eyes 2 (two) times daily.       Marland Kitchen zolpidem (AMBIEN) 5 MG tablet Take 1 tablet (5 mg total) by mouth at bedtime as needed for sleep.  90 tablet  1  . furosemide (LASIX) 40 MG tablet TAKE ONE TABLET BY MOUTH DAILY  90 tablet  3   No current facility-administered medications on file prior to visit.    BP 130/64  Temp(Src) 98.5 F (36.9 C) (Oral)  Ht 5' 10"  (1.778 m)  Wt 178 lb (80.74 kg)  BMI 25.54 kg/m2      Review of Systems  Constitutional: Negative for fever, chills, appetite change and fatigue.  HENT: Negative for congestion, dental problem, ear pain, hearing loss, sore throat, tinnitus, trouble swallowing and voice change.   Eyes: Negative for pain, discharge and visual disturbance.  Respiratory: Negative for cough, chest tightness, wheezing and stridor.   Cardiovascular: Negative for chest pain, palpitations and leg swelling.  Gastrointestinal: Negative for nausea, vomiting, abdominal pain, diarrhea, constipation, blood in stool and abdominal distention.  Genitourinary: Negative for urgency, hematuria, flank pain, discharge, difficulty urinating and genital sores.  Musculoskeletal: Negative for arthralgias, back pain, gait problem, joint swelling, myalgias and neck stiffness.  Skin: Negative for rash.  Neurological: Negative for dizziness, syncope, speech difficulty, weakness, numbness and headaches.  Hematological: Negative for adenopathy. Does not bruise/bleed easily.  Psychiatric/Behavioral: Negative for behavioral problems and dysphoric mood. The patient is not nervous/anxious.        Objective:   Physical Exam  Constitutional: He is oriented to person, place, and time. He appears well-developed.  HENT:  Head: Normocephalic.  Right Ear: External ear normal.  Left Ear: External ear normal.  Eyes: Conjunctivae and EOM are normal.  Neck:  Normal range of motion.  Cardiovascular: Normal rate and normal heart sounds.   Pulmonary/Chest: Breath sounds normal.  Abdominal: Bowel sounds are normal.  Musculoskeletal: Normal range of motion. He exhibits no edema and no tenderness.  Neurological: He is alert and oriented to person, place, and time.  Psychiatric: He has a normal mood and affect. His behavior is normal.          Assessment & Plan:   Diabetes mellitus.  We'll continue with present regimen and recheck hemoglobin A1c next visit Hypertension well controlled Coronary artery disease, stable Dyslipidemia.  Continue statin therapy  Insomnia.  He states he only uses Ambien once or twice per month ago, we'll switch to Restoril 15

## 2013-09-24 ENCOUNTER — Other Ambulatory Visit: Payer: Self-pay | Admitting: Internal Medicine

## 2013-10-08 ENCOUNTER — Telehealth: Payer: Self-pay | Admitting: Internal Medicine

## 2013-10-08 MED ORDER — INSULIN PEN NEEDLE 32G X 4 MM MISC: 1.0000 | Freq: Two times a day (BID) | Status: DC | PRN

## 2013-10-08 NOTE — Telephone Encounter (Signed)
[name], [name] & [facility] RD is requesting re-fill on Insulin Pen Needle (RELION PEN NEEDLES) 32G X 4 MM MISC

## 2013-10-08 NOTE — Telephone Encounter (Signed)
Rx sent 

## 2013-10-09 ENCOUNTER — Telehealth: Payer: Self-pay | Admitting: Internal Medicine

## 2013-10-09 NOTE — Telephone Encounter (Signed)
I received a PA request for Zolpidem but the pt's med list shows he was switched to Temazepam.  Please advise if you want me to proceed with the PA.

## 2013-10-09 NOTE — Telephone Encounter (Signed)
[name], did not need to do PA , pt is on Temazepam now.

## 2013-11-11 ENCOUNTER — Ambulatory Visit: Payer: Commercial Managed Care - HMO | Admitting: Internal Medicine

## 2013-11-13 ENCOUNTER — Ambulatory Visit (INDEPENDENT_AMBULATORY_CARE_PROVIDER_SITE_OTHER): Payer: Commercial Managed Care - HMO | Admitting: Internal Medicine

## 2013-11-13 ENCOUNTER — Encounter: Payer: Self-pay | Admitting: Internal Medicine

## 2013-11-13 VITALS — BP 124/70 | HR 67 | Temp 97.9°F | Resp 20 | Ht 70.0 in | Wt 171.0 lb

## 2013-11-13 DIAGNOSIS — Z23 Encounter for immunization: Secondary | ICD-10-CM

## 2013-11-13 DIAGNOSIS — I252 Old myocardial infarction: Secondary | ICD-10-CM

## 2013-11-13 DIAGNOSIS — I739 Peripheral vascular disease, unspecified: Secondary | ICD-10-CM

## 2013-11-13 DIAGNOSIS — E119 Type 2 diabetes mellitus without complications: Secondary | ICD-10-CM

## 2013-11-13 DIAGNOSIS — E785 Hyperlipidemia, unspecified: Secondary | ICD-10-CM

## 2013-11-13 DIAGNOSIS — N259 Disorder resulting from impaired renal tubular function, unspecified: Secondary | ICD-10-CM

## 2013-11-13 LAB — HEMOGLOBIN A1C: HEMOGLOBIN A1C: 8.2 % — AB (ref 4.6–6.5)

## 2013-11-13 MED ORDER — INSULIN ASPART 100 UNIT/ML FLEXPEN: PEN_INJECTOR | SUBCUTANEOUS | Status: DC

## 2013-11-13 NOTE — Progress Notes (Signed)
Subjective:    Patient ID: [name], male    DOB: September 10, 1931, 80 y.o.   MRN: [id number]  HPI  80 year old patient who is seen today for followup.  He has a history of type 2 diabetes.  He is on basal bolus insulin.  Last hemoglobin A1c 8 point 5.  Instructions for up titration of mealtime insulin were given, but the patient remains on 6 units prior to each meal.  No hypoglycemia Chronic medical problems include CAD and PAD.  He continues to have mild claudication He is scheduled for an eye examination in the near future He is followed by renal medicine for chronic kidney disease  Past Medical History  Diagnosis Date  . CAD (coronary artery disease)   . Diabetes mellitus   . Hyperlipidemia   . Hypertension   . Myocardial infarction   . Renal insufficiency   . Gastric ulcer     History   Social History  . Marital Status: Widowed    Spouse Name: N/A    Number of Children: N/A  . Years of Education: N/A   Occupational History  . Not on file.   Social History Main Topics  . Smoking status: Former Smoker    Quit date: 02/28/1953  . Smokeless tobacco: Never Used  . Alcohol Use: No  . Drug Use: No  . Sexual Activity: Not Currently   Other Topics Concern  . Not on file   Social History Narrative  . No narrative on file    Past Surgical History  Procedure Laterality Date  . Coronary artery bypass graft  2003  . Esophagogastroduodenoscopy  06/10/2011    Procedure: ESOPHAGOGASTRODUODENOSCOPY (EGD);  Surgeon: Ladene Artist, MD,FACG;  Location: [facility] ENDOSCOPY;  Service: Endoscopy;  Laterality: N/A;    Family History  Problem Relation Age of Onset  . Diabetes      brothers and sisters  . Hypertension    . Coronary artery disease    . Stroke Mother   . Hypertension Mother   . Cancer Mother     No Known Allergies  Current Outpatient Prescriptions on File Prior to Visit  Medication Sig Dispense Refill  . ACCU-CHEK SOFTCLIX LANCETS lancets 1 each by  Other route 2 (two) times daily as needed for other.  200 each  12  . Alcohol Swabs (B-D SINGLE USE SWABS REGULAR) PADS USE AS DIRECTED  100 each  12  . amLODipine (NORVASC) 10 MG tablet TAKE 1 TABLET DAILY  90 tablet  3  . aspirin 81 MG tablet Take 81 mg by mouth daily.      . benazepril (LOTENSIN) 40 MG tablet Take 1 tablet (40 mg total) by mouth daily.  90 tablet  3  . Blood Glucose Calibration (ACCU-CHEK AVIVA) SOLN USE AS DIRECTED  3 each  3  . Blood Glucose Monitoring Suppl (ACCU-CHEK AVIVA PLUS) W/DEVICE KIT USE AS DIRECTED  1 kit  0  . cloNIDine (CATAPRES) 0.1 MG tablet TAKE 1 TABLET (0.1 MG TOTAL)  BY MOUTH AT BEDTIME.  90 tablet  3  . cyanocobalamin (,VITAMIN B-12,) 1000 MCG/ML injection Inject 1 mL (1,000 mcg total) into the skin every 30 (thirty) days.  30 mL  3  . furosemide (LASIX) 40 MG tablet TAKE ONE TABLET BY MOUTH DAILY  90 tablet  3  . glucose blood (ACCU-CHEK AVIVA PLUS) test strip 1 each by Other route 2 (two) times daily as needed for other.  100 each  12  .  Insulin Glargine (LANTUS) 100 UNIT/ML Solostar Pen Inject 14 Units into the skin daily at 10 pm.  5 pen  3  . Insulin Pen Needle (RELION PEN NEEDLES) 32G X 4 MM MISC 1 each by Does not apply route 2 (two) times daily as needed.  200 each  4  . labetalol (NORMODYNE) 200 MG tablet TAKE 1 TABLET (200 MG TOTAL)  BY MOUTH 2 (TWO) TIMES DAILY.  180 tablet  3  . pentoxifylline (TRENTAL) 400 MG CR tablet TAKE 1 TABLET (400 MG TOTAL)  BY MOUTH 2 (TWO) TIMES DAILY AT 10 AMAND  5 PM.  180 tablet  3  . simvastatin (ZOCOR) 20 MG tablet TAKE 1 TABLET (20 MG TOTAL) BY MOUTH AT BEDTIME.  90 tablet  3  . SYRINGE-NEEDLE, DISP, 3 ML 25G X 1" 3 ML MISC 1 each by Does not apply route every 14 (fourteen) days.  50 each  3  . timolol (TIMOPTIC) 0.5 % ophthalmic solution Place 1 drop into both eyes 2 (two) times daily.       . temazepam (RESTORIL) 15 MG capsule Take 1 capsule (15 mg total) by mouth at bedtime as needed for sleep.  30 capsule  0     No current facility-administered medications on file prior to visit.    BP 124/70  Pulse 67  Temp(Src) 97.9 F (36.6 C) (Oral)  Resp 20  Ht 5' 10"  (1.778 m)  Wt 171 lb (77.565 kg)  BMI 24.54 kg/m2  SpO2 98%     Review of Systems  Constitutional: Negative for fever, chills, appetite change and fatigue.  HENT: Negative for congestion, dental problem, ear pain, hearing loss, sore throat, tinnitus, trouble swallowing and voice change.   Eyes: Negative for pain, discharge and visual disturbance.  Respiratory: Negative for cough, chest tightness, wheezing and stridor.   Cardiovascular: Negative for chest pain, palpitations and leg swelling.  Gastrointestinal: Negative for nausea, vomiting, abdominal pain, diarrhea, constipation, blood in stool and abdominal distention.  Genitourinary: Negative for urgency, hematuria, flank pain, discharge, difficulty urinating and genital sores.  Musculoskeletal: Positive for gait problem. Negative for arthralgias, back pain, joint swelling, myalgias and neck stiffness.  Skin: Negative for rash.  Neurological: Negative for dizziness, syncope, speech difficulty, weakness, numbness and headaches.  Hematological: Negative for adenopathy. Does not bruise/bleed easily.  Psychiatric/Behavioral: Negative for behavioral problems and dysphoric mood. The patient is not nervous/anxious.        Objective:   Physical Exam  Constitutional: He is oriented to person, place, and time. He appears well-developed.  Blood pressure 126/62  HENT:  Head: Normocephalic.  Right Ear: External ear normal.  Left Ear: External ear normal.  Eyes: Conjunctivae and EOM are normal.  Neck: Normal range of motion.  Cardiovascular: Normal rate and normal heart sounds.   Pulmonary/Chest: Breath sounds normal.  Abdominal: Bowel sounds are normal.  Musculoskeletal: Normal range of motion. He exhibits no edema and no tenderness.  Neurological: He is alert and oriented to person,  place, and time.  Psychiatric: He has a normal mood and affect. His behavior is normal.          Assessment & Plan:   Diabetes mellitus.  We'll check a hemoglobin A1c.  Adjust insulin as indicated Hypertension stable Dyslipidemia.  Continue statin therapy Chronic kidney disease.  Followup nephrology Claudication stable

## 2013-11-13 NOTE — Progress Notes (Signed)
Pre visit review using our clinic review tool, if applicable. No additional management support is needed unless otherwise documented below in the visit note. 

## 2013-11-13 NOTE — Patient Instructions (Signed)
Please check your hemoglobin A1c every 3 months    It is important that you exercise regularly, at least 20 minutes 3 to 4 times per week.  If you develop chest pain or shortness of breath seek  medical attention.  Limit your sodium (Salt) intake   

## 2013-12-10 ENCOUNTER — Encounter: Payer: Self-pay | Admitting: Gastroenterology

## 2014-01-09 ENCOUNTER — Other Ambulatory Visit: Payer: Self-pay

## 2014-01-09 MED ORDER — TEMAZEPAM 15 MG PO CAPS: 15.0000 mg | ORAL_CAPSULE | Freq: Every evening | ORAL | Status: DC | PRN

## 2014-01-13 LAB — HM DIABETES EYE EXAM

## 2014-01-17 ENCOUNTER — Encounter: Payer: Self-pay | Admitting: Internal Medicine

## 2014-03-28 ENCOUNTER — Ambulatory Visit (INDEPENDENT_AMBULATORY_CARE_PROVIDER_SITE_OTHER): Payer: Commercial Managed Care - HMO | Admitting: Internal Medicine

## 2014-03-28 ENCOUNTER — Encounter: Payer: Self-pay | Admitting: Internal Medicine

## 2014-03-28 VITALS — BP 130/76 | HR 68 | Temp 98.2°F | Resp 20 | Ht 69.5 in | Wt 177.0 lb

## 2014-03-28 DIAGNOSIS — E0821 Diabetes mellitus due to underlying condition with diabetic nephropathy: Secondary | ICD-10-CM

## 2014-03-28 DIAGNOSIS — E538 Deficiency of other specified B group vitamins: Secondary | ICD-10-CM

## 2014-03-28 DIAGNOSIS — E785 Hyperlipidemia, unspecified: Secondary | ICD-10-CM | POA: Diagnosis not present

## 2014-03-28 DIAGNOSIS — I739 Peripheral vascular disease, unspecified: Secondary | ICD-10-CM

## 2014-03-28 DIAGNOSIS — Z0000 Encounter for general adult medical examination without abnormal findings: Secondary | ICD-10-CM | POA: Diagnosis not present

## 2014-03-28 DIAGNOSIS — I10 Essential (primary) hypertension: Secondary | ICD-10-CM | POA: Diagnosis not present

## 2014-03-28 DIAGNOSIS — N259 Disorder resulting from impaired renal tubular function, unspecified: Secondary | ICD-10-CM

## 2014-03-28 LAB — LIPID PANEL
CHOL/HDL RATIO: 5
Cholesterol: 154 mg/dL (ref 0–200)
HDL: 29 mg/dL — ABNORMAL LOW (ref >39.00)
LDL Cholesterol: 87 mg/dL (ref 0–99)
NONHDL: 125
Triglycerides: 191 mg/dL — ABNORMAL HIGH (ref 0.0–149.0)
VLDL: 38.2 mg/dL (ref 0.0–40.0)

## 2014-03-28 LAB — CBC WITH DIFFERENTIAL/PLATELET
BASOS PCT: 0.4 % (ref 0.0–3.0)
Basophils Absolute: 0 10*3/uL (ref 0.0–0.1)
EOS PCT: 8.4 % — AB (ref 0.0–5.0)
Eosinophils Absolute: 0.4 10*3/uL (ref 0.0–0.7)
HCT: 36.9 % — ABNORMAL LOW (ref 39.0–52.0)
HEMOGLOBIN: 12.5 g/dL — AB (ref 13.0–17.0)
LYMPHS ABS: 1 10*3/uL (ref 0.7–4.0)
Lymphocytes Relative: 23.4 % (ref 12.0–46.0)
MCHC: 33.9 g/dL (ref 30.0–36.0)
MCV: 92.4 fl (ref 78.0–100.0)
MONO ABS: 0.3 10*3/uL (ref 0.1–1.0)
Monocytes Relative: 6.4 % (ref 3.0–12.0)
NEUTROS PCT: 61.4 % (ref 43.0–77.0)
Neutro Abs: 2.7 10*3/uL (ref 1.4–7.7)
Platelets: 239 10*3/uL (ref 150.0–400.0)
RBC: 3.99 Mil/uL — AB (ref 4.22–5.81)
RDW: 13.3 % (ref 11.5–15.5)
WBC: 4.4 10*3/uL (ref 4.0–10.5)

## 2014-03-28 LAB — COMPREHENSIVE METABOLIC PANEL
ALT: 12 U/L (ref 0–53)
AST: 15 U/L (ref 0–37)
Albumin: 4.1 g/dL (ref 3.5–5.2)
Alkaline Phosphatase: 56 U/L (ref 39–117)
BUN: 31 mg/dL — ABNORMAL HIGH (ref 6–23)
CALCIUM: 9.6 mg/dL (ref 8.4–10.5)
CO2: 28 mEq/L (ref 19–32)
CREATININE: 2.85 mg/dL — AB (ref 0.40–1.50)
Chloride: 103 mEq/L (ref 96–112)
GFR: 27.56 mL/min — AB (ref >60.00)
GLUCOSE: 157 mg/dL — AB (ref 70–99)
POTASSIUM: 4.4 meq/L (ref 3.5–5.1)
Sodium: 138 mEq/L (ref 135–145)
TOTAL PROTEIN: 6.8 g/dL (ref 6.0–8.3)
Total Bilirubin: 0.4 mg/dL (ref 0.2–1.2)

## 2014-03-28 LAB — MICROALBUMIN / CREATININE URINE RATIO
CREATININE, U: 225.2 mg/dL
MICROALB UR: 94.9 mg/dL — AB (ref 0.0–1.9)
MICROALB/CREAT RATIO: 42.1 mg/g — AB (ref 0.0–30.0)

## 2014-03-28 LAB — HEMOGLOBIN A1C: Hgb A1c MFr Bld: 8.9 % — ABNORMAL HIGH (ref 4.6–6.5)

## 2014-03-28 LAB — TSH: TSH: 4.99 u[IU]/mL — AB (ref 0.35–4.50)

## 2014-03-28 MED ORDER — TRAZODONE HCL 50 MG PO TABS: 50.0000 mg | ORAL_TABLET | Freq: Every day | ORAL | Status: DC

## 2014-03-28 MED ORDER — INSULIN GLARGINE 100 UNIT/ML SOLOSTAR PEN
14.0000 [IU] | PEN_INJECTOR | Freq: Every day | SUBCUTANEOUS | Status: DC
Start: 2014-03-28 — End: 2014-09-12

## 2014-03-28 MED ORDER — BENAZEPRIL HCL 40 MG PO TABS: 40.0000 mg | ORAL_TABLET | Freq: Every day | ORAL | Status: DC

## 2014-03-28 MED ORDER — LABETALOL HCL 200 MG PO TABS
ORAL_TABLET | ORAL | Status: DC
Start: 2014-03-28 — End: 2014-11-12

## 2014-03-28 MED ORDER — PENTOXIFYLLINE ER 400 MG PO TBCR: EXTENDED_RELEASE_TABLET | ORAL | Status: DC

## 2014-03-28 MED ORDER — INSULIN ASPART 100 UNIT/ML FLEXPEN: PEN_INJECTOR | SUBCUTANEOUS | Status: DC

## 2014-03-28 MED ORDER — AMLODIPINE BESYLATE 10 MG PO TABS: ORAL_TABLET | ORAL | Status: DC

## 2014-03-28 MED ORDER — FUROSEMIDE 40 MG PO TABS: ORAL_TABLET | ORAL | Status: DC

## 2014-03-28 MED ORDER — SIMVASTATIN 20 MG PO TABS: ORAL_TABLET | ORAL | Status: DC

## 2014-03-28 NOTE — Progress Notes (Signed)
Subjective:    Patient ID: [name], male    DOB: [birth year], 81 y.o.   MRN: [id number]  HPI 11 -year-old patient who was seen today for an annual health examination.  He has a history of coronary artery disease status post CABG in 2003; he has been seen by [facility] cardiology in the past he is doing quite well and has no exertional chest pain. He has stable claudication. The right leg is affected somewhat worse than the left. He is followed by renal medicine for chronic kidney disease. He has type 2 diabetes which has been under reasonable control. His last hemoglobin A1c was not well controlled. He did have a colonoscopy in March of 2010. He was hospitalized in 2013 for treatment of a gastric ulcer and secondary anemia. Presently he is on Lantus insulin 16units at bedtime with normal fasting blood sugars. They are occasional less than 100. He is on mealtime insulin  10 units prior to each meal.  He states with activity throughout the day.  Blood sugars are often less than 100  Past Medical History  Diagnosis Date  . CAD (coronary artery disease)   . Diabetes mellitus   . Hyperlipidemia   . Hypertension   . Myocardial infarction   . Renal insufficiency   . Gastric ulcer    Past Surgical History  Procedure Laterality Date  . Coronary artery bypass graft  2003  . Esophagogastroduodenoscopy  06/10/2011    Procedure: ESOPHAGOGASTRODUODENOSCOPY (EGD);  Surgeon: Ladene Artist, MD,FACG;  Location: [facility] ENDOSCOPY;  Service: Endoscopy;  Laterality: N/A;    reports that he quit smoking about 61 years ago. He has never used smokeless tobacco. He reports that he does not drink alcohol or use illicit drugs. family history includes Cancer in his mother; Coronary artery disease in an other family member; Diabetes in an other family member; Hypertension in his mother and another family member; Stroke in his mother. No Known Allergies   1. Risk factors, based on past  M,S,F history- patient has  coronary artery disease status post CABG. Risk factors include hypertension dyslipidemia and type 2 diabetes 2.  Physical activities: No exercise limitations does have lower extremity claudication right leg greater than the left but does walk daily  3.  Depression/mood: No history depression or mood disorder 4.  Hearing: No deficits 5.  ADL's: Independent in all aspects of daily living 6.  Fall risk: Low  7.  Home safety: No problems identified  8.  Height weight, and visual acuity; height and weight stable no change in visual acuity does have annual eye examinations  9.  Counseling: Heart healthy diet more regular exercise discussed low-salt diet discussed 10. Lab orders based on risk factors: Laboratory profile including lipid panel will be reviewed  11. Referral : We'll followup with renal medicine and ophthalmology  12. Care plan: Continue aggressive risk factor modification. We'll check a lipid profile today blood pressure is under excellent control 13. Cognitive assessment: Alert and oriented with normal affect no cognitive dysfunction 14.  Preventive services will include annual health examination and screening lab.  He will be seen by ophthalmology twice annually and renal medicine annually.  Patient was provided with a written and personalized care plan 15.  Provider list updated.  Includes primary care ophthalmology and renal medicine    Review of Systems  Constitutional: Negative for fever, chills, activity change, appetite change and fatigue.  HENT: Negative for congestion, dental problem, ear pain, hearing loss, mouth sores,  rhinorrhea, sinus pressure, sneezing, tinnitus, trouble swallowing and voice change.   Eyes: Negative for photophobia, pain, redness and visual disturbance.  Respiratory: Negative for apnea, cough, choking, chest tightness, shortness of breath and wheezing.   Cardiovascular: Negative for chest pain, palpitations and leg swelling.  Gastrointestinal:  Negative for nausea, vomiting, abdominal pain, diarrhea, constipation, blood in stool, abdominal distention, anal bleeding and rectal pain.  Genitourinary: Negative for dysuria, urgency, frequency, hematuria, flank pain, decreased urine volume, discharge, penile swelling, scrotal swelling, difficulty urinating, genital sores and testicular pain.  Musculoskeletal: Negative for myalgias, back pain, joint swelling, arthralgias, gait problem, neck pain and neck stiffness.  Skin: Negative for color change, rash and wound.  Neurological: Negative for dizziness, tremors, seizures, syncope, facial asymmetry, speech difficulty, weakness, light-headedness, numbness and headaches.  Hematological: Negative for adenopathy. Does not bruise/bleed easily.  Psychiatric/Behavioral: Negative for suicidal ideas, hallucinations, behavioral problems, confusion, sleep disturbance, self-injury, dysphoric mood, decreased concentration and agitation. The patient is not nervous/anxious.        Objective:   Physical Exam  Constitutional: He appears well-developed and well-nourished.  Blood pressure 140/60 in both arms  HENT:  Head: Normocephalic and atraumatic.  Right Ear: External ear normal.  Left Ear: External ear normal.  Nose: Nose normal.  Mouth/Throat: Oropharynx is clear and moist.  Eyes: Conjunctivae and EOM are normal. Pupils are equal, round, and reactive to light. No scleral icterus.  Arcus senilis  Neck: Normal range of motion. Neck supple. No JVD present. No thyromegaly present.  Soft bruits in the carotid and supraclavicular distributions  Cardiovascular: Normal rate and regular rhythm.  Exam reveals no gallop and no friction rub.   Murmur heard. Pedal pulses are absent  Grade 2/6 systolic murmur Sternotomy scar  Pulmonary/Chest: Effort normal and breath sounds normal. He exhibits no tenderness.  Sternotomy scar  Abdominal: Soft. Bowel sounds are normal. He exhibits no distension and no mass.  There is no tenderness.  Bilateral femoral bruits  Genitourinary: Rectum normal and penis normal. No penile tenderness.  Prostate +2 enlarged  Musculoskeletal: Normal range of motion. He exhibits no edema or tenderness.  Lymphadenopathy:    He has no cervical adenopathy.  Neurological: He is alert. He has normal reflexes. No cranial nerve deficit. Coordination normal.  Skin: Skin is warm and dry. No rash noted.  Psychiatric: He has a normal mood and affect. His behavior is normal.          Assessment & Plan:   Annual health examination Coronary artery disease Diabetes mellitus type 2. check a hemoglobin A1c exercise discussed encouraged Hypertension stable Chronic kidney disease. We'll check a metabolic panel Stable  claudication continue aggressive risk factor modification including statin therapy

## 2014-03-28 NOTE — Patient Instructions (Signed)
Limit your sodium (Salt) intake   Please check your hemoglobin A1c every 3 months    It is important that you exercise regularly, at least 20 minutes 3 to 4 times per week.  If you develop chest pain or shortness of breath seek  medical attention.  Health Maintenance A healthy lifestyle and preventative care can promote health and wellness.  Maintain regular health, dental, and eye exams.  Eat a healthy diet. Foods like vegetables, fruits, whole grains, low-fat dairy products, and lean protein foods contain the nutrients you need and are low in calories. Decrease your intake of foods high in solid fats, added sugars, and salt. Get information about a proper diet from your health care provider, if necessary.  Regular physical exercise is one of the most important things you can do for your health. Most adults should get at least 150 minutes of moderate-intensity exercise (any activity that increases your heart rate and causes you to sweat) each week. In addition, most adults need muscle-strengthening exercises on 2 or more days a week.   Maintain a healthy weight. The body mass index (BMI) is a screening tool to identify possible weight problems. It provides an estimate of body fat based on height and weight. Your health care provider can find your BMI and can help you achieve or maintain a healthy weight. For males 20 years and older:  A BMI below 18.5 is considered underweight.  A BMI of 18.5 to 24.9 is normal.  A BMI of 25 to 29.9 is considered overweight.  A BMI of 30 and above is considered obese.  Maintain normal blood lipids and cholesterol by exercising and minimizing your intake of saturated fat. Eat a balanced diet with plenty of fruits and vegetables. Blood tests for lipids and cholesterol should begin at age 49 and be repeated every 5 years. If your lipid or cholesterol levels are high, you are over age 11, or you are at high risk for heart disease, you may need your cholesterol  levels checked more frequently.Ongoing high lipid and cholesterol levels should be treated with medicines if diet and exercise are not working.  If you smoke, find out from your health care provider how to quit. If you do not use tobacco, do not start.  Lung cancer screening is recommended for adults aged 14-80 years who are at high risk for developing lung cancer because of a history of smoking. A yearly low-dose CT scan of the lungs is recommended for people who have at least a 30-pack-year history of smoking and are current smokers or have quit within the past 15 years. A pack year of smoking is smoking an average of 1 pack of cigarettes a day for 1 year (for example, a 30-pack-year history of smoking could mean smoking 1 pack a day for 30 years or 2 packs a day for 15 years). Yearly screening should continue until the smoker has stopped smoking for at least 15 years. Yearly screening should be stopped for people who develop a health problem that would prevent them from having lung cancer treatment.  If you choose to drink alcohol, do not have more than 2 drinks per day. One drink is considered to be 12 oz (360 mL) of beer, 5 oz (150 mL) of wine, or 1.5 oz (45 mL) of liquor.  Avoid the use of street drugs. Do not share needles with anyone. Ask for help if you need support or instructions about stopping the use of drugs.  High blood  pressure causes heart disease and increases the risk of stroke. Blood pressure should be checked at least every 1-2 years. Ongoing high blood pressure should be treated with medicines if weight loss and exercise are not effective.  If you are 45-79 years old, ask your health care provider if you should take aspirin to prevent heart disease.  Diabetes screening involves taking a blood sample to check your fasting blood sugar level. This should be done once every 3 years after age 45 if you are at a normal weight and without risk factors for diabetes. Testing should be  considered at a younger age or be carried out more frequently if you are overweight and have at least 1 risk factor for diabetes.  Colorectal cancer can be detected and often prevented. Most routine colorectal cancer screening begins at the age of 50 and continues through age 75. However, your health care provider may recommend screening at an earlier age if you have risk factors for colon cancer. On a yearly basis, your health care provider may provide home test kits to check for hidden blood in the stool. A small camera at the end of a tube may be used to directly examine the colon (sigmoidoscopy or colonoscopy) to detect the earliest forms of colorectal cancer. Talk to your health care provider about this at age 50 when routine screening begins. A direct exam of the colon should be repeated every 5-10 years through age 75, unless early forms of precancerous polyps or small growths are found.  People who are at an increased risk for hepatitis B should be screened for this virus. You are considered at high risk for hepatitis B if:  You were born in a country where hepatitis B occurs often. Talk with your health care provider about which countries are considered high risk.  Your parents were born in a high-risk country and you have not received a shot to protect against hepatitis B (hepatitis B vaccine).  You have HIV or AIDS.  You use needles to inject street drugs.  You live with, or have sex with, someone who has hepatitis B.  You are a man who has sex with other men (MSM).  You get hemodialysis treatment.  You take certain medicines for conditions like cancer, organ transplantation, and autoimmune conditions.  Hepatitis C blood testing is recommended for all people born from 1945 through 1965 and any individual with known risk factors for hepatitis C.  Healthy men should no longer receive prostate-specific antigen (PSA) blood tests as part of routine cancer screening. Talk to your health  care provider about prostate cancer screening.  Testicular cancer screening is not recommended for adolescents or adult males who have no symptoms. Screening includes self-exam, a health care provider exam, and other screening tests. Consult with your health care provider about any symptoms you have or any concerns you have about testicular cancer.  Practice safe sex. Use condoms and avoid high-risk sexual practices to reduce the spread of sexually transmitted infections (STIs).  You should be screened for STIs, including gonorrhea and chlamydia if:  You are sexually active and are younger than 24 years.  You are older than 24 years, and your health care provider tells you that you are at risk for this type of infection.  Your sexual activity has changed since you were last screened, and you are at an increased risk for chlamydia or gonorrhea. Ask your health care provider if you are at risk.  If you are at risk   of being infected with HIV, it is recommended that you take a prescription medicine daily to prevent HIV infection. This is called pre-exposure prophylaxis (PrEP). You are considered at risk if:  You are a man who has sex with other men (MSM).  You are a heterosexual man who is sexually active with multiple partners.  You take drugs by injection.  You are sexually active with a partner who has HIV.  Talk with your health care provider about whether you are at high risk of being infected with HIV. If you choose to begin PrEP, you should first be tested for HIV. You should then be tested every 3 months for as long as you are taking PrEP.  Use sunscreen. Apply sunscreen liberally and repeatedly throughout the day. You should seek shade when your shadow is shorter than you. Protect yourself by wearing long sleeves, pants, a wide-brimmed hat, and sunglasses year round whenever you are outdoors.  Tell your health care provider of new moles or changes in moles, especially if there is a  change in shape or color. Also, tell your health care provider if a mole is larger than the size of a pencil eraser.  A one-time screening for abdominal aortic aneurysm (AAA) and surgical repair of large AAAs by ultrasound is recommended for men aged 65-75 years who are current or former smokers.  Stay current with your vaccines (immunizations). Document Released: 08/13/2007 Document Revised: 02/19/2013 Document Reviewed: 07/12/2010 [employer] Patient Information 2015 [employer]. This information is not intended to replace advice given to you by your health care provider. Make sure you discuss any questions you have with your health care provider.  

## 2014-03-28 NOTE — Progress Notes (Signed)
Pre visit review using our clinic review tool, if applicable. No additional management support is needed unless otherwise documented below in the visit note. 

## 2014-05-28 ENCOUNTER — Other Ambulatory Visit: Payer: Self-pay | Admitting: Internal Medicine

## 2014-06-09 ENCOUNTER — Other Ambulatory Visit: Payer: Self-pay | Admitting: Internal Medicine

## 2014-06-23 ENCOUNTER — Other Ambulatory Visit: Payer: Self-pay | Admitting: Internal Medicine

## 2014-06-27 ENCOUNTER — Ambulatory Visit: Payer: Commercial Managed Care - HMO | Admitting: Internal Medicine

## 2014-06-30 DIAGNOSIS — N184 Chronic kidney disease, stage 4 (severe): Secondary | ICD-10-CM | POA: Diagnosis not present

## 2014-07-02 ENCOUNTER — Encounter: Payer: Self-pay | Admitting: Internal Medicine

## 2014-07-02 ENCOUNTER — Ambulatory Visit (INDEPENDENT_AMBULATORY_CARE_PROVIDER_SITE_OTHER): Payer: Commercial Managed Care - HMO | Admitting: Internal Medicine

## 2014-07-02 VITALS — BP 140/70 | HR 74 | Temp 98.2°F | Resp 20 | Ht 69.5 in | Wt 184.0 lb

## 2014-07-02 DIAGNOSIS — I10 Essential (primary) hypertension: Secondary | ICD-10-CM

## 2014-07-02 DIAGNOSIS — E785 Hyperlipidemia, unspecified: Secondary | ICD-10-CM | POA: Diagnosis not present

## 2014-07-02 DIAGNOSIS — E0821 Diabetes mellitus due to underlying condition with diabetic nephropathy: Secondary | ICD-10-CM

## 2014-07-02 DIAGNOSIS — I252 Old myocardial infarction: Secondary | ICD-10-CM

## 2014-07-02 LAB — HEMOGLOBIN A1C: Hgb A1c MFr Bld: 7.6 % — ABNORMAL HIGH (ref 4.6–6.5)

## 2014-07-02 NOTE — Progress Notes (Signed)
Subjective:    Patient ID: [name], male    DOB: 24-Jul-1931, 81 y.o.   MRN: [id number]  HPI 81 year old patient seen today for follow-up.  He has a history of type 2 diabetes.  Remains on basal and bolus insulin, which was uptitrated last visit.  Hemoglobin A1c 8.6.  At the present time.  He is on Lantus insulin 20 units at bedtime.  Fasting blood sugars are generally in the 150-160 range.  His low fasting blood sugar recently 125.  He is on mealtime insulin 14 units 3 times daily His cardiac status has been stable.  No exertional chest pain He has dyslipidemia and continues to tolerate statin therapy He has essential hypertension which is treated with 4 medications.  Blood pressure a bit elevated today, with systolic readings 761-950.  Diastolics are in the seventies.  Blood pressures generally have been much better controlled.  Past Medical History  Diagnosis Date  . CAD (coronary artery disease)   . Diabetes mellitus   . Hyperlipidemia   . Hypertension   . Myocardial infarction   . Renal insufficiency   . Gastric ulcer     History   Social History  . Marital Status: Widowed    Spouse Name: N/A  . Number of Children: N/A  . Years of Education: N/A   Occupational History  . Not on file.   Social History Main Topics  . Smoking status: Former Smoker    Quit date: 02/28/1953  . Smokeless tobacco: Never Used  . Alcohol Use: No  . Drug Use: No  . Sexual Activity: Not Currently   Other Topics Concern  . Not on file   Social History Narrative    Past Surgical History  Procedure Laterality Date  . Coronary artery bypass graft  2003  . Esophagogastroduodenoscopy  06/10/2011    Procedure: ESOPHAGOGASTRODUODENOSCOPY (EGD);  Surgeon: Ladene Artist, MD,FACG;  Location: [facility] ENDOSCOPY;  Service: Endoscopy;  Laterality: N/A;    Family History  Problem Relation Age of Onset  . Diabetes      brothers and sisters  . Hypertension    . Coronary artery disease    .  Stroke Mother   . Hypertension Mother   . Cancer Mother     No Known Allergies  Current Outpatient Prescriptions on File Prior to Visit  Medication Sig Dispense Refill  . ACCU-CHEK AVIVA PLUS test strip USE TWICE DAILY AS NEEDED 100 each 12  . ACCU-CHEK SOFTCLIX LANCETS lancets 1 each by Other route 2 (two) times daily as needed for other. 200 each 12  . Alcohol Swabs (B-D SINGLE USE SWABS REGULAR) PADS USE AS DIRECTED 100 each 12  . amLODipine (NORVASC) 10 MG tablet TAKE 1 TABLET DAILY 90 tablet 3  . aspirin 81 MG tablet Take 81 mg by mouth daily.    . benazepril (LOTENSIN) 40 MG tablet Take 1 tablet (40 mg total) by mouth daily. 90 tablet 3  . Blood Glucose Calibration (ACCU-CHEK AVIVA) SOLN USE AS DIRECTED 3 each 3  . Blood Glucose Monitoring Suppl (ACCU-CHEK AVIVA PLUS) W/DEVICE KIT USE AS DIRECTED 1 kit 0  . cloNIDine (CATAPRES) 0.1 MG tablet TAKE 1 TABLET AT BEDTIME 90 tablet 3  . cyanocobalamin (,VITAMIN B-12,) 1000 MCG/ML injection Inject 1 mL (1,000 mcg total) into the skin every 30 (thirty) days. 30 mL 3  . furosemide (LASIX) 40 MG tablet TAKE ONE TABLET BY MOUTH DAILY 90 tablet 3  . Insulin Glargine (LANTUS) 100 UNIT/ML  Solostar Pen Inject 14 Units into the skin daily at 10 pm. 5 pen 3  . Insulin Pen Needle (RELION PEN NEEDLES) 32G X 4 MM MISC 1 each by Does not apply route 2 (two) times daily as needed. 200 each 4  . labetalol (NORMODYNE) 200 MG tablet TAKE 1 TABLET (200 MG TOTAL)  BY MOUTH 2 (TWO) TIMES DAILY. 180 tablet 3  . NOVOLOG FLEXPEN 100 UNIT/ML FlexPen INJECT 6 UNITS INTO THE SKIN THREE TIMES DAILY WITH MEALS. 15 pen 3  . pentoxifylline (TRENTAL) 400 MG CR tablet TAKE 1 TABLET (400 MG TOTAL)  BY MOUTH 2 (TWO) TIMES DAILY AT 10 AMAND  5 PM. 180 tablet 3  . simvastatin (ZOCOR) 20 MG tablet TAKE 1 TABLET (20 MG TOTAL) BY MOUTH AT BEDTIME. 90 tablet 3  . SYRINGE-NEEDLE, DISP, 3 ML 25G X 1" 3 ML MISC 1 each by Does not apply route every 14 (fourteen) days. 50 each 3  .  timolol (TIMOPTIC) 0.5 % ophthalmic solution Place 1 drop into both eyes 2 (two) times daily.      No current facility-administered medications on file prior to visit.    BP 140/70 mmHg  Pulse 74  Temp(Src) 98.2 F (36.8 C) (Oral)  Resp 20  Ht 5' 9.5" (1.765 m)  Wt 184 lb (83.462 kg)  BMI 26.79 kg/m2  SpO2 97%      Review of Systems  Constitutional: Negative for fever, chills, appetite change and fatigue.  HENT: Negative for congestion, dental problem, ear pain, hearing loss, sore throat, tinnitus, trouble swallowing and voice change.   Eyes: Negative for pain, discharge and visual disturbance.  Respiratory: Negative for cough, chest tightness, wheezing and stridor.   Cardiovascular: Negative for chest pain, palpitations and leg swelling.  Gastrointestinal: Negative for nausea, vomiting, abdominal pain, diarrhea, constipation, blood in stool and abdominal distention.  Genitourinary: Negative for urgency, hematuria, flank pain, discharge, difficulty urinating and genital sores.  Musculoskeletal: Negative for myalgias, back pain, joint swelling, arthralgias, gait problem and neck stiffness.  Skin: Negative for rash.  Neurological: Negative for dizziness, syncope, speech difficulty, weakness, numbness and headaches.  Hematological: Negative for adenopathy. Does not bruise/bleed easily.  Psychiatric/Behavioral: Negative for behavioral problems and dysphoric mood. The patient is not nervous/anxious.        Objective:   Physical Exam  Constitutional: He is oriented to person, place, and time. He appears well-developed.  Blood pressure 140/70  HENT:  Head: Normocephalic.  Right Ear: External ear normal.  Left Ear: External ear normal.  Eyes: Conjunctivae and EOM are normal.  Neck: Normal range of motion.  Cardiovascular: Normal rate and normal heart sounds.   Bilateral femoral bruits  Pulmonary/Chest: Breath sounds normal.  Sternotomy scar  Abdominal: Bowel sounds are  normal.  Musculoskeletal: Normal range of motion. He exhibits no edema or tenderness.  Neurological: He is alert and oriented to person, place, and time.  Psychiatric: He has a normal mood and affect. His behavior is normal.          Assessment & Plan:   Diabetes mellitus.  Will check a hemoglobin A1c.  May require further insulin adjustments Essential hypertension.  Will continue present regimen.  Systolic blood pressures elevated today, but in general has done well Coronary artery disease, stable Dyslipidemia.  Continue statin therapy  Patient is followed by ophthalmology every 6 months.  Compliance encouraged  Recheck 3 months

## 2014-07-02 NOTE — Progress Notes (Signed)
Pre visit review using our clinic review tool, if applicable. No additional management support is needed unless otherwise documented below in the visit note. 

## 2014-07-02 NOTE — Progress Notes (Signed)
Subjective:    Patient ID: [name], male    DOB: 1931/10/04, 81 y.o.   MRN: [id number]  HPI  BP Readings from Last 3 Encounters:  03/28/14 130/76  11/13/13 124/70  08/12/13 130/64    Lab Results  Component Value Date   HGBA1C 8.9* 03/28/2014    81 year old patient who is seen today for follow-up of type 2 diabetes.  Presently he is on Lantus insulin 20 units at bedtime.  Fasting blood sugars generally run between 150 and 160 with a low recently of 125.  He is on mealtime insulin 14 units prior to each meal.  No hypoglycemia He is followed by ophthalmology every 6 months. He has coronary artery disease which has been stable.  He has dyslipidemia and remains on statin therapy.  He has multi-drug-resistant hypertension. No new concerns or complaints No cardiopulmonary complaints  Past Medical History  Diagnosis Date  . CAD (coronary artery disease)   . Diabetes mellitus   . Hyperlipidemia   . Hypertension   . Myocardial infarction   . Renal insufficiency   . Gastric ulcer     History   Social History  . Marital Status: Widowed    Spouse Name: N/A  . Number of Children: N/A  . Years of Education: N/A   Occupational History  . Not on file.   Social History Main Topics  . Smoking status: Former Smoker    Quit date: 02/28/1953  . Smokeless tobacco: Never Used  . Alcohol Use: No  . Drug Use: No  . Sexual Activity: Not Currently   Other Topics Concern  . Not on file   Social History Narrative    Past Surgical History  Procedure Laterality Date  . Coronary artery bypass graft  2003  . Esophagogastroduodenoscopy  06/10/2011    Procedure: ESOPHAGOGASTRODUODENOSCOPY (EGD);  Surgeon: Ladene Artist, MD,FACG;  Location: [facility] ENDOSCOPY;  Service: Endoscopy;  Laterality: N/A;    Family History  Problem Relation Age of Onset  . Diabetes      brothers and sisters  . Hypertension    . Coronary artery disease    . Stroke Mother   . Hypertension Mother     . Cancer Mother     No Known Allergies  Current Outpatient Prescriptions on File Prior to Visit  Medication Sig Dispense Refill  . ACCU-CHEK AVIVA PLUS test strip USE TWICE DAILY AS NEEDED 100 each 12  . ACCU-CHEK SOFTCLIX LANCETS lancets 1 each by Other route 2 (two) times daily as needed for other. 200 each 12  . Alcohol Swabs (B-D SINGLE USE SWABS REGULAR) PADS USE AS DIRECTED 100 each 12  . amLODipine (NORVASC) 10 MG tablet TAKE 1 TABLET DAILY 90 tablet 3  . aspirin 81 MG tablet Take 81 mg by mouth daily.    . benazepril (LOTENSIN) 40 MG tablet Take 1 tablet (40 mg total) by mouth daily. 90 tablet 3  . Blood Glucose Calibration (ACCU-CHEK AVIVA) SOLN USE AS DIRECTED 3 each 3  . Blood Glucose Monitoring Suppl (ACCU-CHEK AVIVA PLUS) W/DEVICE KIT USE AS DIRECTED 1 kit 0  . cloNIDine (CATAPRES) 0.1 MG tablet TAKE 1 TABLET AT BEDTIME 90 tablet 3  . cyanocobalamin (,VITAMIN B-12,) 1000 MCG/ML injection Inject 1 mL (1,000 mcg total) into the skin every 30 (thirty) days. 30 mL 3  . furosemide (LASIX) 40 MG tablet TAKE ONE TABLET BY MOUTH DAILY 90 tablet 3  . Insulin Glargine (LANTUS) 100 UNIT/ML Solostar Pen Inject 14  Units into the skin daily at 10 pm. 5 pen 3  . Insulin Pen Needle (RELION PEN NEEDLES) 32G X 4 MM MISC 1 each by Does not apply route 2 (two) times daily as needed. 200 each 4  . labetalol (NORMODYNE) 200 MG tablet TAKE 1 TABLET (200 MG TOTAL)  BY MOUTH 2 (TWO) TIMES DAILY. 180 tablet 3  . NOVOLOG FLEXPEN 100 UNIT/ML FlexPen INJECT 6 UNITS INTO THE SKIN THREE TIMES DAILY WITH MEALS. 15 pen 3  . pentoxifylline (TRENTAL) 400 MG CR tablet TAKE 1 TABLET (400 MG TOTAL)  BY MOUTH 2 (TWO) TIMES DAILY AT 10 AMAND  5 PM. 180 tablet 3  . simvastatin (ZOCOR) 20 MG tablet TAKE 1 TABLET (20 MG TOTAL) BY MOUTH AT BEDTIME. 90 tablet 3  . SYRINGE-NEEDLE, DISP, 3 ML 25G X 1" 3 ML MISC 1 each by Does not apply route every 14 (fourteen) days. 50 each 3  . timolol (TIMOPTIC) 0.5 % ophthalmic  solution Place 1 drop into both eyes 2 (two) times daily.      No current facility-administered medications on file prior to visit.    BP 140/70 mmHg  Pulse 74  Temp(Src) 98.2 F (36.8 C) (Oral)  Resp 20  Ht 5' 9.5" (1.765 m)  Wt 184 lb (83.462 kg)  BMI 26.79 kg/m2  SpO2 97%     Review of Systems  Constitutional: Negative for fever, chills, appetite change and fatigue.  HENT: Negative for congestion, dental problem, ear pain, hearing loss, sore throat, tinnitus, trouble swallowing and voice change.   Eyes: Negative for pain, discharge and visual disturbance.  Respiratory: Negative for cough, chest tightness, wheezing and stridor.   Cardiovascular: Negative for chest pain, palpitations and leg swelling.  Gastrointestinal: Negative for nausea, vomiting, abdominal pain, diarrhea, constipation, blood in stool and abdominal distention.  Genitourinary: Negative for urgency, hematuria, flank pain, discharge, difficulty urinating and genital sores.  Musculoskeletal: Negative for myalgias, back pain, joint swelling, arthralgias, gait problem and neck stiffness.  Skin: Negative for rash.  Neurological: Negative for dizziness, syncope, speech difficulty, weakness, numbness and headaches.  Hematological: Negative for adenopathy. Does not bruise/bleed easily.  Psychiatric/Behavioral: Negative for behavioral problems and dysphoric mood. The patient is not nervous/anxious.        Objective:   Physical Exam  Constitutional: He is oriented to person, place, and time. He appears well-developed.  Blood pressure 140/80  HENT:  Head: Normocephalic.  Right Ear: External ear normal.  Left Ear: External ear normal.  Eyes: Conjunctivae and EOM are normal.  Neck: Normal range of motion.  Cardiovascular: Normal rate and normal heart sounds.   Pulmonary/Chest: Breath sounds normal.  Abdominal: Bowel sounds are normal.  Musculoskeletal: Normal range of motion. He exhibits no edema or tenderness.   Neurological: He is alert and oriented to person, place, and time.  Psychiatric: He has a normal mood and affect. His behavior is normal.          Assessment & Plan:   Diabetes mellitus.  Will check a hemoglobin A1c.  Will consider further insulin titration Hypertension.  Systolic blood pressure been elevated blood pressure more recently has been very well controlled.  We'll continue present regimen and reassess next visit  Dyslipidemia.  Continue statin therapyCoronary artery disease Chronic kidney disease  Recheck 3 months

## 2014-07-02 NOTE — Patient Instructions (Signed)
Please check your hemoglobin A1c every 3 months  Limit your sodium (Salt) intake    It is important that you exercise regularly, at least 20 minutes 3 to 4 times per week.  If you develop chest pain or shortness of breath seek  medical attention.   

## 2014-07-14 DIAGNOSIS — D485 Neoplasm of uncertain behavior of skin: Secondary | ICD-10-CM | POA: Diagnosis not present

## 2014-07-14 DIAGNOSIS — H26491 Other secondary cataract, right eye: Secondary | ICD-10-CM | POA: Diagnosis not present

## 2014-07-14 DIAGNOSIS — H4011X2 Primary open-angle glaucoma, moderate stage: Secondary | ICD-10-CM | POA: Diagnosis not present

## 2014-07-16 DIAGNOSIS — H4011X2 Primary open-angle glaucoma, moderate stage: Secondary | ICD-10-CM | POA: Diagnosis not present

## 2014-07-21 ENCOUNTER — Other Ambulatory Visit: Payer: Self-pay | Admitting: Ophthalmology

## 2014-07-21 DIAGNOSIS — D2312 Other benign neoplasm of skin of left eyelid, including canthus: Secondary | ICD-10-CM | POA: Diagnosis not present

## 2014-07-21 DIAGNOSIS — D485 Neoplasm of uncertain behavior of skin: Secondary | ICD-10-CM | POA: Diagnosis not present

## 2014-09-10 ENCOUNTER — Inpatient Hospital Stay (HOSPITAL_COMMUNITY)
Admission: EM | Admit: 2014-09-10 | Discharge: 2014-09-12 | DRG: 303 | Disposition: A | Discharge status: Home / Self Care | Payer: Commercial Managed Care - HMO | Attending: Cardiovascular Disease | Admitting: Cardiovascular Disease

## 2014-09-10 ENCOUNTER — Encounter (HOSPITAL_COMMUNITY): Payer: Self-pay | Admitting: Emergency Medicine

## 2014-09-10 ENCOUNTER — Ambulatory Visit: Payer: Commercial Managed Care - HMO | Admitting: Family Medicine

## 2014-09-10 ENCOUNTER — Emergency Department (HOSPITAL_COMMUNITY): Payer: Commercial Managed Care - HMO

## 2014-09-10 DIAGNOSIS — I25110 Atherosclerotic heart disease of native coronary artery with unstable angina pectoris: Secondary | ICD-10-CM | POA: Diagnosis not present

## 2014-09-10 DIAGNOSIS — I209 Angina pectoris, unspecified: Secondary | ICD-10-CM | POA: Diagnosis not present

## 2014-09-10 DIAGNOSIS — I25111 Atherosclerotic heart disease of native coronary artery with angina pectoris with documented spasm: Secondary | ICD-10-CM | POA: Diagnosis not present

## 2014-09-10 DIAGNOSIS — Z951 Presence of aortocoronary bypass graft: Secondary | ICD-10-CM | POA: Diagnosis not present

## 2014-09-10 DIAGNOSIS — E785 Hyperlipidemia, unspecified: Secondary | ICD-10-CM | POA: Diagnosis not present

## 2014-09-10 DIAGNOSIS — I4510 Unspecified right bundle-branch block: Secondary | ICD-10-CM | POA: Diagnosis present

## 2014-09-10 DIAGNOSIS — E1122 Type 2 diabetes mellitus with diabetic chronic kidney disease: Secondary | ICD-10-CM | POA: Diagnosis not present

## 2014-09-10 DIAGNOSIS — R079 Chest pain, unspecified: Secondary | ICD-10-CM | POA: Insufficient documentation

## 2014-09-10 DIAGNOSIS — Z794 Long term (current) use of insulin: Secondary | ICD-10-CM | POA: Diagnosis not present

## 2014-09-10 DIAGNOSIS — Z8711 Personal history of peptic ulcer disease: Secondary | ICD-10-CM

## 2014-09-10 DIAGNOSIS — Z87891 Personal history of nicotine dependence: Secondary | ICD-10-CM

## 2014-09-10 DIAGNOSIS — N179 Acute kidney failure, unspecified: Secondary | ICD-10-CM | POA: Diagnosis present

## 2014-09-10 DIAGNOSIS — E538 Deficiency of other specified B group vitamins: Secondary | ICD-10-CM | POA: Diagnosis not present

## 2014-09-10 DIAGNOSIS — I440 Atrioventricular block, first degree: Secondary | ICD-10-CM | POA: Diagnosis not present

## 2014-09-10 DIAGNOSIS — R0789 Other chest pain: Secondary | ICD-10-CM | POA: Diagnosis not present

## 2014-09-10 DIAGNOSIS — I129 Hypertensive chronic kidney disease with stage 1 through stage 4 chronic kidney disease, or unspecified chronic kidney disease: Secondary | ICD-10-CM | POA: Diagnosis present

## 2014-09-10 DIAGNOSIS — E119 Type 2 diabetes mellitus without complications: Secondary | ICD-10-CM | POA: Diagnosis present

## 2014-09-10 DIAGNOSIS — I10 Essential (primary) hypertension: Secondary | ICD-10-CM | POA: Diagnosis present

## 2014-09-10 DIAGNOSIS — N184 Chronic kidney disease, stage 4 (severe): Secondary | ICD-10-CM | POA: Diagnosis not present

## 2014-09-10 DIAGNOSIS — I252 Old myocardial infarction: Secondary | ICD-10-CM | POA: Diagnosis not present

## 2014-09-10 DIAGNOSIS — I25709 Atherosclerosis of coronary artery bypass graft(s), unspecified, with unspecified angina pectoris: Secondary | ICD-10-CM | POA: Diagnosis present

## 2014-09-10 DIAGNOSIS — I219 Acute myocardial infarction, unspecified: Secondary | ICD-10-CM | POA: Diagnosis present

## 2014-09-10 HISTORY — DX: Type 2 diabetes mellitus without complications: E11.9

## 2014-09-10 HISTORY — DX: Unspecified osteoarthritis, unspecified site: M19.90

## 2014-09-10 HISTORY — DX: Personal history of other diseases of the digestive system: Z87.19

## 2014-09-10 HISTORY — DX: Vitamin B12 deficiency anemia, unspecified: D51.9

## 2014-09-10 LAB — TROPONIN I: Troponin I: 0.06 ng/mL — ABNORMAL HIGH (ref <0.031)

## 2014-09-10 LAB — BASIC METABOLIC PANEL
Anion gap: 9 (ref 5–15)
BUN: 31 mg/dL — ABNORMAL HIGH (ref 6–20)
CO2: 24 mmol/L (ref 22–32)
Calcium: 9.4 mg/dL (ref 8.9–10.3)
Chloride: 107 mmol/L (ref 101–111)
Creatinine, Ser: 3.23 mg/dL — ABNORMAL HIGH (ref 0.61–1.24)
GFR calc Af Amer: 19 mL/min — ABNORMAL LOW (ref >60)
GFR calc non Af Amer: 17 mL/min — ABNORMAL LOW (ref >60)
Glucose, Bld: 124 mg/dL — ABNORMAL HIGH (ref 65–99)
Potassium: 4.4 mmol/L (ref 3.5–5.1)
Sodium: 140 mmol/L (ref 135–145)

## 2014-09-10 LAB — CBC
HCT: 33.1 % — ABNORMAL LOW (ref 39.0–52.0)
HCT: 33.9 % — ABNORMAL LOW (ref 39.0–52.0)
Hemoglobin: 11 g/dL — ABNORMAL LOW (ref 13.0–17.0)
Hemoglobin: 11.3 g/dL — ABNORMAL LOW (ref 13.0–17.0)
MCH: 29.6 pg (ref 26.0–34.0)
MCH: 30.1 pg (ref 26.0–34.0)
MCHC: 33.2 g/dL (ref 30.0–36.0)
MCHC: 33.3 g/dL (ref 30.0–36.0)
MCV: 89.2 fL (ref 78.0–100.0)
MCV: 90.2 fL (ref 78.0–100.0)
Platelets: 206 10*3/uL (ref 150–400)
Platelets: 214 10*3/uL (ref 150–400)
RBC: 3.71 MIL/uL — ABNORMAL LOW (ref 4.22–5.81)
RBC: 3.76 MIL/uL — AB (ref 4.22–5.81)
RDW: 13.7 % (ref 11.5–15.5)
RDW: 13.8 % (ref 11.5–15.5)
WBC: 7.7 10*3/uL (ref 4.0–10.5)
WBC: 6.6 10*3/uL (ref 4.0–10.5)

## 2014-09-10 LAB — PROTIME-INR
INR: 1.02 (ref 0.00–1.49)
PROTHROMBIN TIME: 13.6 s (ref 11.6–15.2)

## 2014-09-10 LAB — MAGNESIUM: Magnesium: 2.1 mg/dL (ref 1.7–2.4)

## 2014-09-10 LAB — APTT: APTT: 36 s (ref 24–37)

## 2014-09-10 LAB — CREATININE, SERUM
Creatinine, Ser: 3.54 mg/dL — ABNORMAL HIGH (ref 0.61–1.24)
GFR calc non Af Amer: 15 mL/min — ABNORMAL LOW (ref >60)
GFR, EST AFRICAN AMERICAN: 17 mL/min — AB (ref >60)

## 2014-09-10 LAB — GLUCOSE, CAPILLARY: GLUCOSE-CAPILLARY: 163 mg/dL — AB (ref 65–99)

## 2014-09-10 LAB — TSH: TSH: 3.608 u[IU]/mL (ref 0.350–4.500)

## 2014-09-10 LAB — T4, FREE: Free T4: 0.61 ng/dL (ref 0.61–1.12)

## 2014-09-10 LAB — I-STAT TROPONIN, ED: Troponin i, poc: 0 ng/mL (ref 0.00–0.08)

## 2014-09-10 MED ORDER — ASPIRIN EC 81 MG PO TBEC: 81.0000 mg | DELAYED_RELEASE_TABLET | Freq: Every day | ORAL | Status: DC

## 2014-09-10 MED ORDER — LABETALOL HCL 200 MG PO TABS
200.0000 mg | ORAL_TABLET | Freq: Two times a day (BID) | ORAL | Status: DC
  Administered 2014-09-10 – 2014-09-12 (×4): 200 mg via ORAL
  Filled 2014-09-10 (×4): qty 1

## 2014-09-10 MED ORDER — AMLODIPINE BESYLATE 10 MG PO TABS
10.0000 mg | ORAL_TABLET | Freq: Every day | ORAL | Status: DC
  Administered 2014-09-11 – 2014-09-12 (×2): 10 mg via ORAL
  Filled 2014-09-10 (×2): qty 1

## 2014-09-10 MED ORDER — PENTOXIFYLLINE ER 400 MG PO TBCR
400.0000 mg | EXTENDED_RELEASE_TABLET | Freq: Two times a day (BID) | ORAL | Status: DC
  Administered 2014-09-11 (×2): 400 mg via ORAL
  Filled 2014-09-10 (×5): qty 1

## 2014-09-10 MED ORDER — ASPIRIN 300 MG RE SUPP: 300.0000 mg | RECTAL | Status: AC

## 2014-09-10 MED ORDER — HEPARIN BOLUS VIA INFUSION
3000.0000 [IU] | Freq: Once | INTRAVENOUS | Status: AC
  Administered 2014-09-10: 3000 [IU] via INTRAVENOUS
  Filled 2014-09-10: qty 3000

## 2014-09-10 MED ORDER — ASPIRIN 81 MG PO CHEW: 324.0000 mg | CHEWABLE_TABLET | ORAL | Status: AC

## 2014-09-10 MED ORDER — ISOSORBIDE MONONITRATE ER 60 MG PO TB24
60.0000 mg | ORAL_TABLET | Freq: Every day | ORAL | Status: DC
  Administered 2014-09-10 – 2014-09-12 (×3): 60 mg via ORAL
  Filled 2014-09-10 (×3): qty 1

## 2014-09-10 MED ORDER — NITROGLYCERIN 0.4 MG SL SUBL: 0.4000 mg | SUBLINGUAL_TABLET | SUBLINGUAL | Status: DC | PRN

## 2014-09-10 MED ORDER — ACETAMINOPHEN 325 MG PO TABS: 650.0000 mg | ORAL_TABLET | ORAL | Status: DC | PRN

## 2014-09-10 MED ORDER — ASPIRIN 81 MG PO CHEW
324.0000 mg | CHEWABLE_TABLET | Freq: Once | ORAL | Status: AC
  Administered 2014-09-10: 324 mg via ORAL
  Filled 2014-09-10: qty 4

## 2014-09-10 MED ORDER — SODIUM CHLORIDE 0.9 % IV SOLN
INTRAVENOUS | Status: DC
  Administered 2014-09-10: via INTRAVENOUS

## 2014-09-10 MED ORDER — HEPARIN SODIUM (PORCINE) 5000 UNIT/ML IJ SOLN
5000.0000 [IU] | Freq: Three times a day (TID) | INTRAMUSCULAR | Status: DC
  Administered 2014-09-10: 5000 [IU] via SUBCUTANEOUS
  Filled 2014-09-10: qty 1

## 2014-09-10 MED ORDER — TRAZODONE HCL 50 MG PO TABS
50.0000 mg | ORAL_TABLET | Freq: Every evening | ORAL | Status: DC | PRN
  Administered 2014-09-10: 50 mg via ORAL
  Filled 2014-09-10: qty 1

## 2014-09-10 MED ORDER — HEPARIN (PORCINE) IN NACL 100-0.45 UNIT/ML-% IJ SOLN
1200.0000 [IU]/h | INTRAMUSCULAR | Status: DC
  Administered 2014-09-10 – 2014-09-11 (×2): 1000 [IU]/h via INTRAVENOUS
  Filled 2014-09-10 (×2): qty 250

## 2014-09-10 MED ORDER — CLONIDINE HCL 0.1 MG PO TABS
0.1000 mg | ORAL_TABLET | Freq: Every day | ORAL | Status: DC
  Administered 2014-09-10 – 2014-09-11 (×2): 0.1 mg via ORAL
  Filled 2014-09-10 (×2): qty 1

## 2014-09-10 MED ORDER — INSULIN GLARGINE 100 UNIT/ML ~~LOC~~ SOLN
20.0000 [IU] | Freq: Every day | SUBCUTANEOUS | Status: DC
  Administered 2014-09-10 – 2014-09-11 (×2): 20 [IU] via SUBCUTANEOUS
  Filled 2014-09-10 (×3): qty 0.2

## 2014-09-10 MED ORDER — SIMVASTATIN 20 MG PO TABS
20.0000 mg | ORAL_TABLET | Freq: Every day | ORAL | Status: DC
  Administered 2014-09-11: 20 mg via ORAL
  Filled 2014-09-10: qty 1

## 2014-09-10 MED ORDER — ASPIRIN EC 81 MG PO TBEC
81.0000 mg | DELAYED_RELEASE_TABLET | Freq: Every day | ORAL | Status: DC
  Administered 2014-09-11 – 2014-09-12 (×2): 81 mg via ORAL
  Filled 2014-09-10 (×2): qty 1

## 2014-09-10 MED ORDER — ONDANSETRON HCL 4 MG/2ML IJ SOLN: 4.0000 mg | Freq: Four times a day (QID) | INTRAMUSCULAR | Status: DC | PRN

## 2014-09-10 MED ORDER — LATANOPROST 0.005 % OP SOLN
1.0000 [drp] | Freq: Every day | OPHTHALMIC | Status: DC
  Administered 2014-09-10 – 2014-09-11 (×2): 1 [drp] via OPHTHALMIC
  Filled 2014-09-10: qty 2.5

## 2014-09-10 MED ORDER — TIMOLOL MALEATE 0.25 % OP SOLN
1.0000 [drp] | Freq: Every day | OPHTHALMIC | Status: DC
  Administered 2014-09-11 – 2014-09-12 (×2): 1 [drp] via OPHTHALMIC
  Filled 2014-09-10: qty 5

## 2014-09-10 MED ORDER — ACETAMINOPHEN 500 MG PO TABS: 500.0000 mg | ORAL_TABLET | Freq: Every day | ORAL | Status: DC | PRN

## 2014-09-10 NOTE — ED Notes (Signed)
Pt c/o mid sternal CP and SOB with exertion; pt denies pain except upon exertion at present; sx x 3 days

## 2014-09-10 NOTE — ED Notes (Signed)
Patient states he wants to hold off on IV until he knows he is going to be admitted. States he has no CP so there is no need for IV at this time.

## 2014-09-10 NOTE — ED Provider Notes (Signed)
CSN: [id number]     Arrival date & time 09/10/14  1243 History   First MD Initiated Contact with Patient 09/10/14 1417     Chief Complaint  Patient presents with  . Chest Pain     (Consider location/radiation/quality/duration/timing/severity/associated sxs/prior Treatment) HPI   Patient is an 81 yo AA male presenting to the [place] ED with a chief complaint of chest pain.  PMHx is significant for CAD, DM type II, HTN, hyperlipidemia, and CKD (baseline Cr 2.0-3.5 over past 5 years). Patient states that he has noticed increased SOB with exertion for the past two weeks and has new onset chest pain with exertion for the past 3 days.  States the pain is located in the middle of his chest, described as burning in quality, and denies any radiating pain.  Patient states that his chest pain is relieved with rest after 5-10 minutes of rest.  States he also had mild lower extremity edema during the last two days that has since resolved.  Denies any SOB, orthopnea, PND.  Past Medical History  Diagnosis Date  . CAD (coronary artery disease)   . Diabetes mellitus   . Hyperlipidemia   . Hypertension   . Myocardial infarction   . Renal insufficiency   . Gastric ulcer    Past Surgical History  Procedure Laterality Date  . Coronary artery bypass graft  2003  . Esophagogastroduodenoscopy  06/10/2011    Procedure: ESOPHAGOGASTRODUODENOSCOPY (EGD);  Surgeon: Malcolm T Stark, MD,FACG;  Location: [facility] END[facility];  Service: Endoscopy;  Laterality: N/A;   Family History  Problem Relation Age of Onset  . Diabetes      brothers and sisters  . Hypertension    . Coronary artery disease    . Stroke Mother   . Hypertension Mother   . Cancer Mother    History  Substance Use Topics  . Smoking status: Former Smoker    Quit date: 02/28/1953  . Smokeless tobacco: Never Used  . Alcohol Use: No    Review of Systems  Constitutional: Negative for chills, diaphoresis and fatigue.  Respiratory: Positive for  shortness of breath. Negative for cough and wheezing.   Cardiovascular: Positive for chest pain and leg swelling. Negative for palpitations.  Gastrointestinal: Negative for nausea, vomiting, diarrhea and constipation.  Genitourinary: Negative for dysuria, frequency and difficulty urinating.  Neurological: Negative for dizziness, syncope and weakness.      Allergies  Review of patient's allergies indicates no known allergies.  Home Medications   Prior to Admission medications   Medication Sig Start Date End Date Taking? Authorizing Provider  ACCU-CHEK AVIVA PLUS test strip USE TWICE DAILY AS NEEDED 06/09/14   Peter F Kwiatkowski, MD  ACCU-CHEK SOFTCLIX LANCETS lancets 1 each by Other route 2 (two) times daily as needed for other. 05/29/13   Peter F Kwiatkowski, MD  Alcohol Swabs (B-D SINGLE USE SWABS REGULAR) PADS USE AS DIRECTED 05/29/13   Peter F Kwiatkowski, MD  amLODipine (NORVASC) 10 MG tablet TAKE 1 TABLET DAILY 03/28/14   Peter F Kwiatkowski, MD  aspirin 81 MG tablet Take 81 mg by mouth daily.    Historical Provider, MD  benazepril (LOTENSIN) 40 MG tablet Take 1 tablet (40 mg total) by mouth daily. 03/28/14   Peter F Kwiatkowski, MD  Blood Glucose Calibration (ACCU-CHEK AVIVA) SOLN USE AS DIRECTED 05/29/13   Peter F Kwiatkowski, MD  Blood Glucose Monitoring Suppl (ACCU-CHEK AVIVA PLUS) W/DEVICE KIT USE AS DIRECTED 09/24/13   Peter F Kwiatkowski, MD    cloNIDine (CATAPRES) 0.1 MG tablet TAKE 1 TABLET AT BEDTIME 06/23/14   Marletta Lor, MD  cyanocobalamin (,VITAMIN B-12,) 1000 MCG/ML injection Inject 1 mL (1,000 mcg total) into the skin every 30 (thirty) days. 07/19/13   Marletta Lor, MD  furosemide (LASIX) 40 MG tablet TAKE ONE TABLET BY MOUTH DAILY 03/28/14   Marletta Lor, MD  Insulin Glargine (LANTUS) 100 UNIT/ML Solostar Pen Inject 14 Units into the skin daily at 10 pm. 03/28/14   Marletta Lor, MD  Insulin Pen Needle (RELION PEN NEEDLES) 32G X 4 MM MISC 1 each by Does  not apply route 2 (two) times daily as needed. 10/08/13   Marletta Lor, MD  labetalol (NORMODYNE) 200 MG tablet TAKE 1 TABLET (200 MG TOTAL)  BY MOUTH 2 (TWO) TIMES DAILY. 03/28/14   Marletta Lor, MD  NOVOLOG FLEXPEN 100 UNIT/ML FlexPen INJECT 6 UNITS INTO THE SKIN THREE TIMES DAILY WITH MEALS. 05/28/14   Marletta Lor, MD  pentoxifylline (TRENTAL) 400 MG CR tablet TAKE 1 TABLET (400 MG TOTAL)  BY MOUTH 2 (TWO) TIMES DAILY AT 10 AMAND  5 PM. 03/28/14   Marletta Lor, MD  simvastatin (ZOCOR) 20 MG tablet TAKE 1 TABLET (20 MG TOTAL) BY MOUTH AT BEDTIME. 03/28/14   Marletta Lor, MD  SYRINGE-NEEDLE, DISP, 3 ML 25G X 1" 3 ML MISC 1 each by Does not apply route every 14 (fourteen) days. 06/27/11   Marletta Lor, MD  timolol (TIMOPTIC) 0.5 % ophthalmic solution Place 1 drop into both eyes 2 (two) times daily.  07/19/12   Historical Provider, MD   BP 155/57 mmHg  Pulse 56  Temp(Src) 98.3 F (36.8 C) (Oral)  Resp 16  Ht 5' 11" (1.803 m)  SpO2 100% Physical Exam  Constitutional: He appears well-developed and well-nourished.  HENT:  Head: Normocephalic and atraumatic.  Neck: No JVD present. Carotid bruit is not present.  Cardiovascular: Regular rhythm, normal heart sounds and intact distal pulses.   Pulmonary/Chest: Effort normal. He has no wheezes. He has no rhonchi. He has no rales.  Abdominal: Soft. Normal appearance and bowel sounds are normal.  Neurological: He is alert.  Skin: Skin is dry and intact.  Psychiatric: He has a normal mood and affect. His speech is normal and behavior is normal. Thought content normal.    ED Course  Procedures (including critical care time) Labs Review Labs Reviewed  BASIC METABOLIC PANEL - Abnormal; Notable for the following:    Glucose, Bld 124 (*)    BUN 31 (*)    Creatinine, Ser 3.23 (*)    GFR calc non Af Amer 17 (*)    GFR calc Af Amer 19 (*)    All other components within normal limits  CBC - Abnormal; Notable for  the following:    RBC 3.71 (*)    Hemoglobin 11.0 (*)    HCT 33.1 (*)    All other components within normal limits  I-STAT TROPOININ, ED    Imaging Review Dg Chest 2 View  09/10/2014   CLINICAL DATA:  Sternal chest pain for several days.  EXAM: CHEST  2 VIEW  COMPARISON:  06/09/2011  FINDINGS: Prior median sternotomy with post CABG changes. Degenerative changes in the thoracic spine. Heart size is normal. Both lungs are clear. Negative for pleural effusions. Subtle nodular density in the right lower chest probably represents a nipple shadow.  IMPRESSION: No active cardiopulmonary disease.  Probable right nipple shadow.  Electronically Signed   By: Markus Daft M.D.   On: 09/10/2014 13:33     EKG Interpretation None      MDM   Final diagnoses:  None   1. Angina, new onset -Cardiology consulted to evaluate pt for new onset angina. -Troponin in ED - 0.00. -CXR showed no active cardiopulmonary disease. -EKG shows non-specific changes.    Jule Ser, DO 09/10/14 1628  Virgel Manifold, MD 09/18/14 [phone]

## 2014-09-10 NOTE — Progress Notes (Signed)
Pt trop .06. Md on call paged.

## 2014-09-10 NOTE — Progress Notes (Signed)
Patient heart rate consisently in the 50's, RN has noted heart rate as low as 53.  Cardiology paged, Aundra Dubin returned page and was updated.  Per Aundra Dubin ok to give patient's scheduled labetalol and clonidine this evening.

## 2014-09-10 NOTE — Progress Notes (Signed)
ANTICOAGULATION CONSULT NOTE - Initial Consult  Pharmacy Consult for Heparin Indication: chest pain/ACS  No Known Allergies  Patient Measurements: Height: '5\' 11"'  (180.3 cm) Weight: 179 lb 3.2 oz (81.285 kg) IBW/kg (Calculated) : 75.3 Heparin Dosing Weight: 81 kg  Vital Signs: Temp: 98.1 F (36.7 C) (07/13 1923) Temp Source: Oral (07/13 1923) BP: 147/59 mmHg (07/13 2249) Pulse Rate: 56 (07/13 2249)  Labs:  Recent Labs  09/10/14 1302 09/10/14 2033  HGB 11.0* 11.3*  HCT 33.1* 33.9*  PLT 206 214  APTT  --  36  LABPROT  --  13.6  INR  --  1.02  CREATININE 3.23* 3.54*  TROPONINI  --  0.06*    Estimated Creatinine Clearance: 17.4 mL/min (by C-G formula based on Cr of 3.54).   Medical History: Past Medical History  Diagnosis Date  . CAD (coronary artery disease)   . Hyperlipidemia   . Hypertension   . Myocardial infarction 1998  . Heart murmur   . Type II diabetes mellitus   . History of stomach ulcers   . Arthritis     "touch in my fingers" (09/10/2014)  . Renal insufficiency   . Acute renal failure     [name] 09/10/2014  . B12 deficiency anemia     "stopped taking the shots in ~ 06/2014" (09/10/2014)    Medications:  Prescriptions prior to admission  Medication Sig Dispense Refill Last Dose  . acetaminophen (TYLENOL) 500 MG tablet Take 500 mg by mouth daily as needed for mild pain or headache.   2 weeks  . amLODipine (NORVASC) 10 MG tablet TAKE 1 TABLET DAILY 90 tablet 3 09/10/2014 at Unknown time  . aspirin EC 81 MG tablet Take 81 mg by mouth daily.   09/10/2014 at Unknown time  . benazepril (LOTENSIN) 40 MG tablet Take 1 tablet (40 mg total) by mouth daily. 90 tablet 3 09/10/2014 at Unknown time  . cloNIDine (CATAPRES) 0.1 MG tablet TAKE 1 TABLET AT BEDTIME 90 tablet 3 09/09/2014 at Unknown time  . furosemide (LASIX) 40 MG tablet TAKE ONE TABLET BY MOUTH DAILY 90 tablet 3 09/10/2014 at Unknown time  . Insulin Glargine (LANTUS) 100 UNIT/ML Solostar Pen Inject 14  Units into the skin daily at 10 pm. (Patient taking differently: Inject 20 Units into the skin daily at 10 pm. ) 5 pen 3 09/09/2014 at Unknown time  . labetalol (NORMODYNE) 200 MG tablet TAKE 1 TABLET (200 MG TOTAL)  BY MOUTH 2 (TWO) TIMES DAILY. 180 tablet 3 09/10/2014 at 0930  . latanoprost (XALATAN) 0.005 % ophthalmic solution Place 1 drop into both eyes at bedtime.    09/09/2014 at Unknown time  . NOVOLOG FLEXPEN 100 UNIT/ML FlexPen INJECT 6 UNITS INTO THE SKIN THREE TIMES DAILY WITH MEALS. (Patient taking differently: INJECT 14 UNITS INTO THE SKIN THREE TIMES DAILY WITH MEALS.) 15 pen 3 09/10/2014 at Unknown time  . pentoxifylline (TRENTAL) 400 MG CR tablet TAKE 1 TABLET (400 MG TOTAL)  BY MOUTH 2 (TWO) TIMES DAILY AT 10 AMAND  5 PM. 180 tablet 3 09/10/2014 at Unknown time  . simvastatin (ZOCOR) 20 MG tablet TAKE 1 TABLET (20 MG TOTAL) BY MOUTH AT BEDTIME. 90 tablet 3 09/09/2014 at Unknown time  . timolol (TIMOPTIC) 0.25 % ophthalmic solution Place 1 drop into both eyes daily.    09/10/2014 at Unknown time  . ACCU-CHEK AVIVA PLUS test strip USE TWICE DAILY AS NEEDED 100 each 12 Taking  . ACCU-CHEK SOFTCLIX LANCETS lancets 1 each by  Other route 2 (two) times daily as needed for other. 200 each 12 Taking  . Alcohol Swabs (B-D SINGLE USE SWABS REGULAR) PADS USE AS DIRECTED 100 each 12 Taking  . Blood Glucose Calibration (ACCU-CHEK AVIVA) SOLN USE AS DIRECTED 3 each 3 Taking  . Blood Glucose Monitoring Suppl (ACCU-CHEK AVIVA PLUS) W/DEVICE KIT USE AS DIRECTED 1 kit 0 Taking  . cyanocobalamin (,VITAMIN B-12,) 1000 MCG/ML injection Inject 1 mL (1,000 mcg total) into the skin every 30 (thirty) days. (Patient not taking: Reported on 09/10/2014) 30 mL 3 Not Taking at Unknown time  . Insulin Pen Needle (RELION PEN NEEDLES) 32G X 4 MM MISC 1 each by Does not apply route 2 (two) times daily as needed. 200 each 4 Taking  . SYRINGE-NEEDLE, DISP, 3 ML 25G X 1" 3 ML MISC 1 each by Does not apply route every 14 (fourteen)  days. 50 each 3 Taking  . traZODone (DESYREL) 50 MG tablet Take 50 mg by mouth at bedtime as needed for sleep.    3 months at Unknown time    Assessment: 81 y.o. male presents with CP. Trop up to 0.06. To begin heparin for r/o ACS. Noted pt with SCr 3.54, est CrCl 17 ml/min. CBC stable at baseline.  SQ heparin 5000 units given ~2230  Goal of Therapy:  Heparin level 0.3-0.7 units/ml Monitor platelets by anticoagulation protocol: Yes   Plan:  D/c SQ heparin Heparin IV bolus 3000 units (reduced due to recent SQ heparin) Heparin gtt at 1000 units/hr Will f/u 8 hr heparin level  Daily heparin level and CBC  Sherlon Handing, PharmD, BCPS Clinical pharmacist, pager [phone] 09/10/2014,11:24 PM

## 2014-09-10 NOTE — H&P (Signed)
[name] is an 81 y.o. male.    Primary Cardiologist:Dr. Tamala Julian  PCP: [name], MD  Chief Complaint: Chest pain HPI: 81 year old male with hx MI late 56s or early 1990s with PTCA of RCA.  In 2003 CAD with LAD 80-90% stenosis and LAD disease under going  Coronary artery bypass graft  06-2001 surgery x 5 using a left internal mammary artery graft to the left anterior descending coronary artery, with a saphenous vein graft to the diagonal branch of the left anterior descending, a sequential saphenous vein graft to the first and second obtuse marginal branches of the left circumflex coronary artery and a saphenous vein graft to the posterior descending branch of the right coronary artery.  He has CKD-4 followed by renal.  He has HTN, DM-2, & B12 deficiency. Has not seen Dr. Tamala Julian in 2-3 years or so but no cardiac issues.  Now presents with chest pain has been occuring with exertion, pt walks 1.5 miles a day and now with walking half a mile he develops chest pressure- a stinging and burning. With rest it resolves.  It is associated with SOB.  Has had some lower ext edema as well.  He tried to get appt today for pain but given appt for Sept.  His PCP asked him to come to ER.  No pain today.  But anxious about the pain.  This is first episode since CABG.  I do not find any Echo or nuc study, may be in old paper records.   No nausea, or diaphoresis.   Troponin is negative. Cr. Higher than usual of 2.5 now 3.23 GFR 19.    EKG SR with PVCs incomplete RBBB 1st degree AV block. Similar to EKG 2013 but now with PVCs.    Past Medical History  Diagnosis Date  . CAD (coronary artery disease)   . Diabetes mellitus   . Hyperlipidemia   . Hypertension   . Myocardial infarction   . Renal insufficiency   . Gastric ulcer     Past Surgical History  Procedure Laterality Date  . Coronary artery bypass graft  2003  . Esophagogastroduodenoscopy  06/10/2011   Procedure: ESOPHAGOGASTRODUODENOSCOPY (EGD);  Surgeon: Ladene Artist, MD,FACG;  Location: [facility] ENDOSCOPY;  Service: Endoscopy;  Laterality: N/A;  . Cardiac catheterization      Family History  Problem Relation Age of Onset  . Diabetes      brothers and sisters  . Hypertension    . Coronary artery disease    . Stroke Mother   . Hypertension Mother   . Cancer Mother    Social History:  reports that he quit smoking about 61 years ago. He has never used smokeless tobacco. He reports that he does not drink alcohol or use illicit drugs.  Allergies: No Known Allergies  OUTPATIENT MEDICATIONS: No current facility-administered medications on file prior to encounter.   Current Outpatient Prescriptions on File Prior to Encounter  Medication Sig Dispense Refill  . amLODipine (NORVASC) 10 MG tablet TAKE 1 TABLET DAILY 90 tablet 3  . benazepril (LOTENSIN) 40 MG tablet Take 1 tablet (40 mg total) by mouth daily. 90 tablet 3  . cloNIDine (CATAPRES) 0.1 MG tablet TAKE 1 TABLET AT BEDTIME 90 tablet 3  . furosemide (LASIX) 40 MG tablet TAKE ONE TABLET BY MOUTH DAILY 90 tablet 3  . Insulin Glargine (LANTUS) 100 UNIT/ML Solostar Pen Inject 14 Units into the skin daily at 10  pm. (Patient taking differently: Inject 20 Units into the skin daily at 10 pm. ) 5 pen 3  . labetalol (NORMODYNE) 200 MG tablet TAKE 1 TABLET (200 MG TOTAL)  BY MOUTH 2 (TWO) TIMES DAILY. 180 tablet 3  . NOVOLOG FLEXPEN 100 UNIT/ML FlexPen INJECT 6 UNITS INTO THE SKIN THREE TIMES DAILY WITH MEALS. (Patient taking differently: INJECT 14 UNITS INTO THE SKIN THREE TIMES DAILY WITH MEALS.) 15 pen 3  . pentoxifylline (TRENTAL) 400 MG CR tablet TAKE 1 TABLET (400 MG TOTAL)  BY MOUTH 2 (TWO) TIMES DAILY AT 10 AMAND  5 PM. 180 tablet 3  . simvastatin (ZOCOR) 20 MG tablet TAKE 1 TABLET (20 MG TOTAL) BY MOUTH AT BEDTIME. 90 tablet 3  . ACCU-CHEK AVIVA PLUS test strip USE TWICE DAILY AS NEEDED 100 each 12  . ACCU-CHEK SOFTCLIX LANCETS lancets 1  each by Other route 2 (two) times daily as needed for other. 200 each 12  . Alcohol Swabs (B-D SINGLE USE SWABS REGULAR) PADS USE AS DIRECTED 100 each 12  . Blood Glucose Calibration (ACCU-CHEK AVIVA) SOLN USE AS DIRECTED 3 each 3  . Blood Glucose Monitoring Suppl (ACCU-CHEK AVIVA PLUS) W/DEVICE KIT USE AS DIRECTED 1 kit 0  . cyanocobalamin (,VITAMIN B-12,) 1000 MCG/ML injection Inject 1 mL (1,000 mcg total) into the skin every 30 (thirty) days. (Patient not taking: Reported on 09/10/2014) 30 mL 3  . Insulin Pen Needle (RELION PEN NEEDLES) 32G X 4 MM MISC 1 each by Does not apply route 2 (two) times daily as needed. 200 each 4  . SYRINGE-NEEDLE, DISP, 3 ML 25G X 1" 3 ML MISC 1 each by Does not apply route every 14 (fourteen) days. 50 each 3     Results for orders placed or performed during the hospital encounter of 09/10/14 (from the past 48 hour(s))  Basic metabolic panel     Status: Abnormal   Collection Time: 09/10/14  1:02 PM  Result Value Ref Range   Sodium 140 135 - 145 mmol/L   Potassium 4.4 3.5 - 5.1 mmol/L   Chloride 107 101 - 111 mmol/L   CO2 24 22 - 32 mmol/L   Glucose, Bld 124 (H) 65 - 99 mg/dL   BUN 31 (H) 6 - 20 mg/dL   Creatinine, Ser 3.23 (H) 0.61 - 1.24 mg/dL   Calcium 9.4 8.9 - 10.3 mg/dL   GFR calc non Af Amer 17 (L) >60 mL/min   GFR calc Af Amer 19 (L) >60 mL/min    Comment: (NOTE) The eGFR has been calculated using the CKD EPI equation. This calculation has not been validated in all clinical situations. eGFR's persistently <60 mL/min signify possible Chronic Kidney Disease.    Anion gap 9 5 - 15  CBC     Status: Abnormal   Collection Time: 09/10/14  1:02 PM  Result Value Ref Range   WBC 7.7 4.0 - 10.5 K/uL   RBC 3.71 (L) 4.22 - 5.81 MIL/uL   Hemoglobin 11.0 (L) 13.0 - 17.0 g/dL   HCT 33.1 (L) 39.0 - 52.0 %   MCV 89.2 78.0 - 100.0 fL   MCH 29.6 26.0 - 34.0 pg   MCHC 33.2 30.0 - 36.0 g/dL   RDW 13.7 11.5 - 15.5 %   Platelets 206 150 - 400 K/uL  I-stat  troponin, ED     Status: None   Collection Time: 09/10/14  1:14 PM  Result Value Ref Range   Troponin i, poc 0.00 0.00 -  0.08 ng/mL   Comment 3            Comment: Due to the release kinetics of cTnI, a negative result within the first hours of the onset of symptoms does not rule out myocardial infarction with certainty. If myocardial infarction is still suspected, repeat the test at appropriate intervals.    Dg Chest 2 View  09/10/2014   CLINICAL DATA:  Sternal chest pain for several days.  EXAM: CHEST  2 VIEW  COMPARISON:  06/09/2011  FINDINGS: Prior median sternotomy with post CABG changes. Degenerative changes in the thoracic spine. Heart size is normal. Both lungs are clear. Negative for pleural effusions. Subtle nodular density in the right lower chest probably represents a nipple shadow.  IMPRESSION: No active cardiopulmonary disease.  Probable right nipple shadow.   Electronically Signed   By: Markus Daft M.D.   On: 09/10/2014 13:33    ROS: General:no colds or fevers, no weight changes Skin:no rashes or ulcers HEENT:no blurred vision, no congestion CV:see HPI PUL:see HPI GI:no diarrhea constipation or melena, no indigestion GU:no hematuria, no dysuria MS:no joint pain, some claudication with walking, has had some edema Neuro:no syncope, no lightheadedness Endo:+ diabetes, no thyroid disease   Blood pressure 179/67, pulse 58, temperature 98.3 F (36.8 C), temperature source Oral, resp. rate 18, height 5' 11"  (1.803 m), SpO2 98 %. PE: General:Pleasant affect, NAD Skin:Warm and dry, brisk capillary refill HEENT:normocephalic, sclera clear, mucus membranes moist Neck:supple, mild JVD, no bruits  Heart:S1S2 RRR without murmur, gallup, rub or click Lungs: with rales in bases, no rhonchi, or wheezes QQP:YPPJ, non tender, + BS, do not palpate liver spleen or masses Ext:no lower ext edema today though he has had, 2+ pedal pulses, 2+ radial pulses Neuro:alert and oriented X 3, MAE,  follows commands, + facial symmetry    Assessment/Plan Principal Problem:   Chest pain- initial troponin negative. New chest pain for pt, with his HTN and more acute renal failure may be beneficial to admit and do lexiscan stress test.  Would add Imdur po. No heparin as pt not having acute pain though if troponins + would add.  If + MI then will need to discuss cardiac cath.   Active Problems:   Acute renal failure with Cr 3.23  In Jan Cr was 2.85, in 11/2013 2.68-- hold ACE  And lasix for now.   Diabetes mellitus with renal complications- in May [id number] 7.6   Dyslipidemia- on zocor 20 Jan LDL 87, TC 154, TG 191, HDL 29   Essential hypertension-elevated currently on Lotensin,labetolol, catapres, lasix amlodipine.     MYOCARDIAL INFARCTION, HX OF 1980s or so   Coronary atherosclerosis, CABG in 2003   B12 deficiency    [facility] Nurse Practitioner Certified [place] Pager [phone] or after 5pm or weekends call [phone] 09/10/2014, 5:03 PM  I have personally seen and examined this patient with Cecilie Kicks, NP. I agree with the assessment and plan as outlined above. He has known CAD dating back to 25 years ago with prior stenting and then CABG in 2003. He has been very active but over the last 3 day is having chest pain with ambulation. EKG does not show ischemic changes. Troponin negative x 1. Exam is unremarkable. He has regular rate and rhythm, clear lung fields. No LE edema. His presentation may represent angina. Unfortunately he has stage 4 kidney disease. I have reviewed the risk of contrast nephropathy with repeat cath. If he rules out,  will plan Lexiscan stress myoview in the am. If his stress test is abnormal, will have to consider cardiac cath knowing that this could be detrimental to his kidney function. Will hold Ace inh and Lasix tonight.   Jerral Mccauley 09/10/2014 5:41 PM

## 2014-09-10 NOTE — ED Provider Notes (Signed)
I saw and evaluated the patient, reviewed the resident's note and I agree with the findings and plan.   EKG Interpretation   Date/Time:  Wednesday September 10 2014 12:50:31 EDT Ventricular Rate:  70 PR Interval:  206 QRS Duration: 92 QT Interval:  412 QTC Calculation: 444 R Axis:   -71 Text Interpretation:  Sinus rhythm with frequent Premature ventricular  complexes Left axis deviation Pulmonary disease pattern Incomplete right  bundle branch block Inferior infarct , age undetermined Abnormal ECG  Confirmed by Wilson Singer  MD, Jakai Onofre ([id number]) on 09/10/2014 3:10:59 PM      83yM with ~3w history of exertional dyspnea and 2-3 days history of also now having some CP with exertion. Hx of CAD. S/p CABG 2003. Reports Dr. Daneen Schick is his cardiologist but has not seem him in several years. EKG w/o overt changes. Initial troponin normal. With typical features and progression of symptoms, will discuss with cardiology.   Virgel Manifold, MD 09/10/14 [phone]

## 2014-09-10 NOTE — ED Notes (Signed)
Cardiology NP at bedside.

## 2014-09-10 NOTE — ED Notes (Signed)
Sec called and had Meal tray sent to floor.

## 2014-09-10 NOTE — ED Notes (Signed)
Cardiology MD at bedside.

## 2014-09-11 ENCOUNTER — Inpatient Hospital Stay (HOSPITAL_COMMUNITY): Payer: Commercial Managed Care - HMO

## 2014-09-11 DIAGNOSIS — I209 Angina pectoris, unspecified: Secondary | ICD-10-CM

## 2014-09-11 DIAGNOSIS — R079 Chest pain, unspecified: Secondary | ICD-10-CM

## 2014-09-11 DIAGNOSIS — I25111 Atherosclerotic heart disease of native coronary artery with angina pectoris with documented spasm: Secondary | ICD-10-CM

## 2014-09-11 LAB — NM MYOCAR MULTI W/SPECT W/WALL MOTION / EF
CHL CUP MPHR: 139 {beats}/min
CHL CUP NUCLEAR SRS: 14
CHL CUP NUCLEAR SSS: 24
CSEPED: 5 min
CSEPEDS: 0 s
CSEPPHR: 81 {beats}/min
Estimated workload: 1 METS
LHR: 0.12
LV dias vol: 119 mL
LV sys vol: 64 mL
NUC STRESS TID: 1
Percent HR: 58 %
Rest HR: 55 {beats}/min
SDS: 10

## 2014-09-11 LAB — BASIC METABOLIC PANEL
Anion gap: 11 (ref 5–15)
BUN: 33 mg/dL — ABNORMAL HIGH (ref 6–20)
CO2: 25 mmol/L (ref 22–32)
CREATININE: 3.46 mg/dL — AB (ref 0.61–1.24)
Calcium: 9.3 mg/dL (ref 8.9–10.3)
Chloride: 104 mmol/L (ref 101–111)
GFR calc Af Amer: 18 mL/min — ABNORMAL LOW (ref >60)
GFR, EST NON AFRICAN AMERICAN: 15 mL/min — AB (ref >60)
Glucose, Bld: 128 mg/dL — ABNORMAL HIGH (ref 65–99)
POTASSIUM: 4.4 mmol/L (ref 3.5–5.1)
Sodium: 140 mmol/L (ref 135–145)

## 2014-09-11 LAB — GLUCOSE, CAPILLARY
GLUCOSE-CAPILLARY: 178 mg/dL — AB (ref 65–99)
GLUCOSE-CAPILLARY: 126 mg/dL — AB (ref 65–99)
Glucose-Capillary: 120 mg/dL — ABNORMAL HIGH (ref 65–99)

## 2014-09-11 LAB — LIPID PANEL
CHOLESTEROL: 133 mg/dL (ref 0–200)
HDL: 26 mg/dL — AB (ref >40)
LDL Cholesterol: 76 mg/dL (ref 0–99)
TRIGLYCERIDES: 153 mg/dL — AB (ref <150)
Total CHOL/HDL Ratio: 5.1 RATIO
VLDL: 31 mg/dL (ref 0–40)

## 2014-09-11 LAB — CBC
HEMATOCRIT: 31.5 % — AB (ref 39.0–52.0)
HEMOGLOBIN: 10.6 g/dL — AB (ref 13.0–17.0)
MCH: 29.9 pg (ref 26.0–34.0)
MCHC: 33.7 g/dL (ref 30.0–36.0)
MCV: 89 fL (ref 78.0–100.0)
PLATELETS: 214 10*3/uL (ref 150–400)
RBC: 3.54 MIL/uL — AB (ref 4.22–5.81)
RDW: 13.8 % (ref 11.5–15.5)
WBC: 6.1 10*3/uL (ref 4.0–10.5)

## 2014-09-11 LAB — TROPONIN I
TROPONIN I: 0.07 ng/mL — AB (ref <0.031)
Troponin I: 0.08 ng/mL — ABNORMAL HIGH (ref <0.031)

## 2014-09-11 LAB — HEPARIN LEVEL (UNFRACTIONATED): Heparin Unfractionated: 0.65 IU/mL (ref 0.30–0.70)

## 2014-09-11 MED ORDER — REGADENOSON 0.4 MG/5ML IV SOLN
0.4000 mg | Freq: Once | INTRAVENOUS | Status: AC
Start: 2014-09-11 — End: 2014-09-11
  Administered 2014-09-11: 0.4 mg via INTRAVENOUS
  Filled 2014-09-11: qty 5

## 2014-09-11 MED ORDER — REGADENOSON 0.4 MG/5ML IV SOLN
INTRAVENOUS | Status: AC
  Administered 2014-09-11: 0.4 mg via INTRAVENOUS
  Filled 2014-09-11: qty 5

## 2014-09-11 MED ORDER — DIPHENHYDRAMINE HCL 25 MG PO CAPS: 50.0000 mg | ORAL_CAPSULE | Freq: Every evening | ORAL | Status: DC | PRN

## 2014-09-11 MED ORDER — TECHNETIUM TC 99M SESTAMIBI GENERIC - CARDIOLITE
10.0000 | Freq: Once | INTRAVENOUS | Status: AC | PRN
  Administered 2014-09-11: 10 via INTRAVENOUS

## 2014-09-11 MED ORDER — TECHNETIUM TC 99M SESTAMIBI GENERIC - CARDIOLITE
30.0000 | Freq: Once | INTRAVENOUS | Status: AC | PRN
  Administered 2014-09-11: 30 via INTRAVENOUS

## 2014-09-11 MED ORDER — ZOLPIDEM TARTRATE 5 MG PO TABS
5.0000 mg | ORAL_TABLET | Freq: Every evening | ORAL | Status: DC | PRN
  Administered 2014-09-11: 5 mg via ORAL
  Filled 2014-09-11: qty 1

## 2014-09-11 MED ORDER — BISACODYL 5 MG PO TBEC
5.0000 mg | DELAYED_RELEASE_TABLET | Freq: Every day | ORAL | Status: DC | PRN
  Administered 2014-09-11: 5 mg via ORAL
  Filled 2014-09-11: qty 1

## 2014-09-11 NOTE — Progress Notes (Addendum)
Patient Profile: 81 y/o male, followed by Dr. Tamala Julian, with known CAD dating back to 25 years ago with prior stenting and then CABG in 2003, stage 4 CKD, HTN and T2DM, presenting with complaints of exertional chest pain. Cardiac enzymes mildly elevated with flat trend at 0.06, 0.07, 0.08.  Subjective: No complaints. CP free at rest. No dyspnea.   Objective: Vital signs in last 24 hours: Temp:  [98.1 F (36.7 C)-98.3 F (36.8 C)] 98.2 F (36.8 C) (07/14 0500) Pulse Rate:  [52-74] 55 (07/14 0500) Resp:  [15-19] 18 (07/14 0500) BP: (125-195)/(49-81) 125/49 mmHg (07/14 0500) SpO2:  [97 %-100 %] 97 % (07/14 0500) Weight:  [179 lb (81.194 kg)-179 lb 3.2 oz (81.285 kg)] 179 lb (81.194 kg) (07/14 0500) Last BM Date: 09/08/14  Intake/Output from previous day: 07/13 0701 - 07/14 0700 In: 872 [P.O.:702; I.V.:170] Out: 250 [Urine:250] Intake/Output this shift:    Medications Current Facility-Administered Medications  Medication Dose Route Frequency Provider Last Rate Last Dose  . 0.9 %  sodium chloride infusion   Intravenous Continuous Isaiah Serge, NP 10 mL/hr at 09/10/14 2351    . acetaminophen (TYLENOL) tablet 650 mg  650 mg Oral Q4H PRN Isaiah Serge, NP      . amLODipine (NORVASC) tablet 10 mg  10 mg Oral Daily Isaiah Serge, NP      . aspirin EC tablet 81 mg  81 mg Oral Daily Isaiah Serge, NP      . cloNIDine (CATAPRES) tablet 0.1 mg  0.1 mg Oral QHS Isaiah Serge, NP   0.1 mg at 09/10/14 2249  . heparin ADULT infusion 100 units/mL (25000 units/250 mL)  1,000 Units/hr Intravenous Continuous Franky Macho, RPH 10 mL/hr at 09/10/14 2351 1,000 Units/hr at 09/10/14 2351  . insulin glargine (LANTUS) injection 20 Units  20 Units Subcutaneous Q2200 Isaiah Serge, NP   20 Units at 09/10/14 2237  . isosorbide mononitrate (IMDUR) 24 hr tablet 60 mg  60 mg Oral Daily Isaiah Serge, NP   60 mg at 09/10/14 2003  . labetalol (NORMODYNE) tablet 200 mg  200 mg Oral BID Isaiah Serge, NP    200 mg at 09/10/14 2249  . latanoprost (XALATAN) 0.005 % ophthalmic solution 1 drop  1 drop Both Eyes QHS Isaiah Serge, NP   1 drop at 09/10/14 2238  . nitroGLYCERIN (NITROSTAT) SL tablet 0.4 mg  0.4 mg Sublingual Q5 Min x 3 PRN Isaiah Serge, NP      . ondansetron [facility]) injection 4 mg  4 mg Intravenous Q6H PRN Isaiah Serge, NP      . pentoxifylline (TRENTAL) CR tablet 400 mg  400 mg Oral BID AC Isaiah Serge, NP      . regadenoson Carlton Adam) 0.4 MG/5ML injection SOLN           . regadenoson (LEXISCAN) injection SOLN 0.4 mg  0.4 mg Intravenous Once Isaiah Serge, NP      . simvastatin (ZOCOR) tablet 20 mg  20 mg Oral q1800 Isaiah Serge, NP      . timolol (TIMOPTIC) 0.25 % ophthalmic solution 1 drop  1 drop Both Eyes Daily Isaiah Serge, NP      . traZODone (DESYREL) tablet 50 mg  50 mg Oral QHS PRN Isaiah Serge, NP   50 mg at 09/10/14 2250    PE: General appearance: alert, cooperative and no distress Neck: no carotid bruit and no  JVD Lungs: clear to auscultation bilaterally Heart: regular rate and rhythm, S1, S2 normal, no murmur, click, rub or gallop Extremities: no LEE Pulses: 2+ and symmetric Skin: warm and dry Neurologic: Grossly normal  Lab Results:   Recent Labs  09/10/14 1302 09/10/14 2033 09/11/14 0746  WBC 7.7 6.6 6.1  HGB 11.0* 11.3* 10.6*  HCT 33.1* 33.9* 31.5*  PLT 206 214 214   BMET  Recent Labs  09/10/14 1302 09/10/14 2033 09/11/14 0746  NA 140  --  140  K 4.4  --  4.4  CL 107  --  104  CO2 24  --  25  GLUCOSE 124*  --  128*  BUN 31*  --  33*  CREATININE 3.23* 3.54* 3.46*  CALCIUM 9.4  --  9.3   PT/INR  Recent Labs  09/10/14 2033  LABPROT 13.6  INR 1.02   Cholesterol  Recent Labs  09/11/14 0746  CHOL 133   Cardiac Panel (last 3 results)  Recent Labs  09/10/14 2033 09/11/14 0040 09/11/14 0746  TROPONINI 0.06* 0.07* 0.08*    Studies/Results: Lexiscan NST- results pending   Assessment/Plan  Principal  Problem:   Chest pain Active Problems:   Diabetes mellitus with renal complications   Dyslipidemia   Essential hypertension   MYOCARDIAL INFARCTION, HX OF   Coronary atherosclerosis, CABG in 2003   B12 deficiency  1. Chest Pain/CAD: h/o CABG in 2003, now with exertional chest pain. Cardiac enzymes slightly elevated x 3 but with flat trend at 0.06, 0.07, 0.08. NST completed this am to assess for myocardial ischemia. Results pending.   2. CKD: stage 4 CKD. SCr up from baseline at 3.46 (baseline ~2.5). ACE-I and diuretic both on hold.  3. HTN: moderately elevated this am in the 123456 systolic but BP meds held for stress test. Resume amlodipine, clonidine Imdur and labetalol.  4. T2DM: controlled with insulin.   5. DLD: LDL close to goal at 76. HDL is low at 26. Continue simvastatin.     LOS: 1 day    Brittainy M. Ladoris Gene 09/11/2014 9:23 AM  Attending Note:   The patient was seen and examined.  Agree with assessment and plan as noted above.  Changes made to the above note as needed.  I have had a long discussion with patient , daughter and 2 sons via conference call. His myoivew shows reversible ischemia in the ant. Lat wall.   He does not want to go on dialysis  .  His son Park Liter is coming up from [place] today and we will all talk tomorrow.  Have scheduled him for cath .  I have spent > 1 hour talking to patient, family, reviewing myoview.    Thayer Headings, Brooke Bonito., MD, [facility] 09/11/2014, 12:52 PM [address]  [place] Pager [phone]

## 2014-09-11 NOTE — Progress Notes (Signed)
UR Completed Sedric Guia Graves-Bigelow, RN,BSN [phone]  

## 2014-09-11 NOTE — Progress Notes (Signed)
  Echocardiogram 2D Echocardiogram has been performed.  Bobbye Charleston 09/11/2014, 12:21 PM

## 2014-09-11 NOTE — Progress Notes (Signed)
ANTICOAGULATION CONSULT NOTE - Follow Up Consult  Pharmacy Consult for Heparin  Indication: chest pain/ACS  No Known Allergies  Patient Measurements: Height: 5\' 11"  (180.3 cm) Weight: 179 lb (Brandon.194 kg) IBW/kg (Calculated) : 75.3  Vital Signs: Temp: 98.2 F (36.8 C) (07/14 0500) Temp Source: Oral (07/14 0500) BP: 125/49 mmHg (07/14 0500) Pulse Rate: 55 (07/14 0500)  Labs:  Recent Labs  09/10/14 1302 09/10/14 2033 09/11/14 0040 09/11/14 0746  HGB 11.0* 11.3*  --  10.6*  HCT 33.1* 33.9*  --  31.5*  PLT 206 214  --  214  APTT  --  36  --   --   LABPROT  --  13.6  --   --   INR  --  1.02  --   --   HEPARINUNFRC  --   --   --  0.65  CREATININE 3.23* 3.54*  --  3.46*  TROPONINI  --  0.06* 0.07* 0.08*    Estimated Creatinine Clearance: 17.8 mL/min (by C-G formula based on Cr of 3.46).   Medications:  Scheduled:  . amLODipine  10 mg Oral Daily  . aspirin EC  Brandon mg Oral Daily  . cloNIDine  0.1 mg Oral QHS  . insulin glargine  20 Units Subcutaneous Q2200  . isosorbide mononitrate  60 mg Oral Daily  . labetalol  200 mg Oral BID  . latanoprost  1 drop Both Eyes QHS  . pentoxifylline  400 mg Oral BID AC  . simvastatin  20 mg Oral q1800  . timolol  1 drop Both Eyes Daily   Infusions:  . sodium chloride 10 mL/hr at 09/10/14 2351  . heparin 1,000 Units/hr (09/10/14 2351)   PRN: acetaminophen, nitroGLYCERIN, ondansetron (ZOFRAN) IV, traZODone  Assessment: [name] presenting with chest pain/possible ACS with elevated troponin of 0.06. SQ heparin 5000 units given ~2230 on 7/13 then d/c. Pt given Heparin IVB 3000 units x 1 and heparin IV 1000 units/hr on 7/13. HL therapeutic at 0.65, Hgb 10.6, Plts 214. No s/sx bleeding noted.   Goal of Therapy:  Heparin level 0.3-0.7 units/ml Monitor platelets by anticoagulation protocol: Yes   Plan:  Continue Heparin gtt at 1000 units/hr Daily heparin level and CBC Monitor for s/sx of bleeding  Kem Parkinson, PharmD Pharmacy  Resident 09/11/2014,9:22 AM

## 2014-09-12 ENCOUNTER — Encounter (HOSPITAL_COMMUNITY)
Admission: EM | Disposition: A | Discharge status: Home / Self Care | Payer: Self-pay | Source: Home / Self Care | Attending: Cardiovascular Disease

## 2014-09-12 DIAGNOSIS — I219 Acute myocardial infarction, unspecified: Secondary | ICD-10-CM | POA: Diagnosis present

## 2014-09-12 LAB — BASIC METABOLIC PANEL
Anion gap: 7 (ref 5–15)
BUN: 40 mg/dL — ABNORMAL HIGH (ref 6–20)
CALCIUM: 8.9 mg/dL (ref 8.9–10.3)
CO2: 24 mmol/L (ref 22–32)
Chloride: 107 mmol/L (ref 101–111)
Creatinine, Ser: 3.53 mg/dL — ABNORMAL HIGH (ref 0.61–1.24)
GFR, EST AFRICAN AMERICAN: 17 mL/min — AB (ref >60)
GFR, EST NON AFRICAN AMERICAN: 15 mL/min — AB (ref >60)
GLUCOSE: 140 mg/dL — AB (ref 65–99)
POTASSIUM: 4.2 mmol/L (ref 3.5–5.1)
SODIUM: 138 mmol/L (ref 135–145)

## 2014-09-12 LAB — GLUCOSE, CAPILLARY
GLUCOSE-CAPILLARY: 125 mg/dL — AB (ref 65–99)
GLUCOSE-CAPILLARY: 127 mg/dL — AB (ref 65–99)

## 2014-09-12 LAB — CBC
HCT: 28.6 % — ABNORMAL LOW (ref 39.0–52.0)
HEMOGLOBIN: 9.6 g/dL — AB (ref 13.0–17.0)
MCH: 29.9 pg (ref 26.0–34.0)
MCHC: 33.6 g/dL (ref 30.0–36.0)
MCV: 89.1 fL (ref 78.0–100.0)
PLATELETS: 186 10*3/uL (ref 150–400)
RBC: 3.21 MIL/uL — ABNORMAL LOW (ref 4.22–5.81)
RDW: 13.7 % (ref 11.5–15.5)
WBC: 6.4 10*3/uL (ref 4.0–10.5)

## 2014-09-12 LAB — HEPARIN LEVEL (UNFRACTIONATED): HEPARIN UNFRACTIONATED: 0.22 [IU]/mL — AB (ref 0.30–0.70)

## 2014-09-12 SURGERY — LEFT HEART CATH AND CORS/GRAFTS ANGIOGRAPHY

## 2014-09-12 MED ORDER — SODIUM CHLORIDE 0.9 % IJ SOLN: 3.0000 mL | INTRAMUSCULAR | Status: DC | PRN

## 2014-09-12 MED ORDER — ISOSORBIDE MONONITRATE ER 60 MG PO TB24: 60.0000 mg | ORAL_TABLET | Freq: Every day | ORAL | Status: DC

## 2014-09-12 MED ORDER — SODIUM CHLORIDE 0.9 % IJ SOLN: 3.0000 mL | Freq: Two times a day (BID) | INTRAMUSCULAR | Status: DC

## 2014-09-12 MED ORDER — ASPIRIN 81 MG PO CHEW: 81.0000 mg | CHEWABLE_TABLET | ORAL | Status: DC

## 2014-09-12 MED ORDER — NITROGLYCERIN 0.4 MG SL SUBL: 0.4000 mg | SUBLINGUAL_TABLET | SUBLINGUAL | Status: DC | PRN

## 2014-09-12 MED ORDER — SODIUM CHLORIDE 0.9 % IV SOLN: 250.0000 mL | INTRAVENOUS | Status: DC | PRN

## 2014-09-12 MED ORDER — INSULIN ASPART 100 UNIT/ML FLEXPEN: PEN_INJECTOR | SUBCUTANEOUS | Status: DC

## 2014-09-12 MED ORDER — INSULIN GLARGINE 100 UNIT/ML SOLOSTAR PEN: 20.0000 [IU] | PEN_INJECTOR | Freq: Every day | SUBCUTANEOUS | Status: DC

## 2014-09-12 MED ORDER — ISOSORBIDE MONONITRATE ER 30 MG PO TB24: 30.0000 mg | ORAL_TABLET | Freq: Every day | ORAL | Status: DC

## 2014-09-12 MED ORDER — SODIUM CHLORIDE 0.9 % WEIGHT BASED INFUSION: 1.0000 mL/kg/h | INTRAVENOUS | Status: DC

## 2014-09-12 MED ORDER — SODIUM CHLORIDE 0.9 % WEIGHT BASED INFUSION: 3.0000 mL/kg/h | INTRAVENOUS | Status: AC

## 2014-09-12 NOTE — Care Management Important Message (Signed)
Important Message  Patient Details  Name: [name] MRN: [id number] Date of Birth: 08/18/1931   Medicare Important Message Given:  Yes-second notification given    Pricilla Handler 09/12/2014, 11:38 AM

## 2014-09-12 NOTE — Progress Notes (Signed)
ANTICOAGULATION CONSULT NOTE - Follow Up Consult  Pharmacy Consult for heparin Indication: chest pain/ACS   Labs:  Recent Labs  09/10/14 1302 09/10/14 2033 09/11/14 0040 09/11/14 0746 09/12/14 0315 09/12/14 0500  HGB 11.0* 11.3*  --  10.6*  --  9.6*  HCT 33.1* 33.9*  --  31.5*  --  28.6*  PLT 206 214  --  214  --  186  APTT  --  36  --   --   --   --   LABPROT  --  13.6  --   --   --   --   INR  --  1.02  --   --   --   --   HEPARINUNFRC  --   --   --  0.65 0.22*  --   CREATININE 3.23* 3.54*  --  3.46*  --   --   TROPONINI  --  0.06* 0.07* 0.08*  --   --      Assessment: 81yo male now subtherapeutic on heparin after one level at goal, likely 2/2 bolus.  Goal of Therapy:  Heparin level 0.3-0.7 units/ml   Plan:  Will increase heparin gtt by 2-3 units/kg/hr to 1200 units/hr and check level in [place], PharmD, BCPS  09/12/2014,4:38 AM

## 2014-09-12 NOTE — Discharge Summary (Signed)
   Patient ID: [name],  MRN: [id number], DOB/AGE: 07/28/1931 81 y.o.  Admit date: 09/10/2014 Discharge date: 09/12/2014  Primary Care Provider: KWIATKOWSKI,PETER FRANK, MD Primary Cardiologist: Dr Smith  Discharge Diagnoses Principal Problem:   Unstable angina Active Problems:   Coronary atherosclerosis, CABG in 2003   Chronic renal disease, stage IV-pt declines dialysis   Diabetes mellitus with renal complications   Essential hypertension   MYOCARDIAL INFARCTION, HX OF   Dyslipidemia    Procedures: Echo 09/11/14 Study Conclusions  - Left ventricle: Wall thickness was increased in a pattern of severe LVH. Systolic function was normal. The estimated ejection fraction was in the range of 55% to 60%. Doppler parameters are consistent with elevated ventricular end-diastolic filling pressure. - Aortic valve: The aortic valve is midl to moderately calcified but appears to open well on PLA/SA planes with only mild gradient. However there is a moderate gradient by suprasternal notch with Pk velocity of 3.2m/sec peak gradient . Suggest CTA and TEE if clinically indicated to further assess level of gradient. There was mild regurgitation. Valve area (VTI): 1.42 cm^2. Valve area (Vmax): 1.09 cm^2. Valve area (Vmean): 1.38 cm^2. - Mitral valve: Calcified annulus. Mildly thickened leaflets . Valve area by continuity equation (using LVOT flow): 1.51 cm^2. - Atrial septum: No defect or patent foramen ovale was identified.   Hospital Course:  81 y/o male, followed by Dr. Smith, with known CAD dating back 25 years with prior stenting and then CABG in 2003. He also has stage 4 CKD, HTN and T2DM. He presented 09/10/14 with complaints of exertional chest pain. Cardiac enzymes were mildly elevated with flat trend at 0.06, 0.07, 0.08.  After admission Dr Nahser had a long discussion with the pt and family. The pt refuses to consider dialysis. Dr Nahser feels he  can be discharge on medical Rx. He will f/u with Dr Smith as an OP.   Discharge Vitals:  Blood pressure 156/64, pulse 63, temperature 98.9 F (37.2 C), temperature source Oral, resp. rate 18, height 5' 11" (1.803 m), weight 177 lb 8 oz (80.513 kg), SpO2 97 %.    Labs: Results for orders placed or performed during the hospital encounter of 09/10/14 (from the past 24 hour(s))  Glucose, capillary     Status: Abnormal   Collection Time: 09/11/14 11:35 AM  Result Value Ref Range   Glucose-Capillary 178 (H) 65 - 99 mg/dL   Comment 1 Notify RN    Comment 2 Document in Chart   Glucose, capillary     Status: Abnormal   Collection Time: 09/11/14  4:16 PM  Result Value Ref Range   Glucose-Capillary 126 (H) 65 - 99 mg/dL  Glucose, capillary     Status: Abnormal   Collection Time: 09/11/14  8:38 PM  Result Value Ref Range   Glucose-Capillary 125 (H) 65 - 99 mg/dL  Heparin level (unfractionated)     Status: Abnormal   Collection Time: 09/12/14  3:15 AM  Result Value Ref Range   Heparin Unfractionated 0.22 (L) 0.30 - 0.70 IU/mL  CBC     Status: Abnormal   Collection Time: 09/12/14  5:00 AM  Result Value Ref Range   WBC 6.4 4.0 - 10.5 K/uL   RBC 3.21 (L) 4.22 - 5.81 MIL/uL   Hemoglobin 9.6 (L) 13.0 - 17.0 g/dL   HCT 28.6 (L) 39.0 - 52.0 %   MCV 89.1 78.0 - 100.0 fL   MCH 29.9 26.0 - 34.0 pg   MCHC 33.6   30.0 - 36.0 g/dL   RDW 13.7 11.5 - 15.5 %   Platelets 186 150 - 400 K/uL  Glucose, capillary     Status: Abnormal   Collection Time: 09/12/14  7:40 AM  Result Value Ref Range   Glucose-Capillary 127 (H) 65 - 99 mg/dL   Comment 1 Notify RN    Comment 2 Document in Chart   Basic metabolic panel     Status: Abnormal   Collection Time: 09/12/14  8:38 AM  Result Value Ref Range   Sodium 138 135 - 145 mmol/L   Potassium 4.2 3.5 - 5.1 mmol/L   Chloride 107 101 - 111 mmol/L   CO2 24 22 - 32 mmol/L   Glucose, Bld 140 (H) 65 - 99 mg/dL   BUN 40 (H) 6 - 20 mg/dL   Creatinine, Ser 3.53 (H)  0.61 - 1.24 mg/dL   Calcium 8.9 8.9 - 10.3 mg/dL   GFR calc non Af Amer 15 (L) >60 mL/min   GFR calc Af Amer 17 (L) >60 mL/min   Anion gap 7 5 - 15    Disposition:  Follow-up Information    Follow up with [name], MD.   Specialty:  Cardiology   Why:  office will contact you   Contact information:   [address] [address] [phone]       Discharge Medications:    Medication List    STOP taking these medications        benazepril 40 MG tablet  Commonly known as:  LOTENSIN     cyanocobalamin 1000 MCG/ML injection  Commonly known as:  (VITAMIN B-12)      TAKE these medications        ACCU-CHEK AVIVA PLUS test strip  Generic drug:  glucose blood  USE TWICE DAILY AS NEEDED     ACCU-CHEK AVIVA PLUS W/DEVICE Kit  USE AS DIRECTED     ACCU-CHEK AVIVA Soln  USE AS DIRECTED     ACCU-CHEK SOFTCLIX LANCETS lancets  1 each by Other route 2 (two) times daily as needed for other.     acetaminophen 500 MG tablet  Commonly known as:  TYLENOL  Take 500 mg by mouth daily as needed for mild pain or headache.     amLODipine 10 MG tablet  Commonly known as:  NORVASC  TAKE 1 TABLET DAILY     aspirin EC 81 MG tablet  Take 81 mg by mouth daily.     B-D SINGLE USE SWABS REGULAR Pads  USE AS DIRECTED     cloNIDine 0.1 MG tablet  Commonly known as:  CATAPRES  TAKE 1 TABLET AT BEDTIME     furosemide 40 MG tablet  Commonly known as:  LASIX  TAKE ONE TABLET BY MOUTH DAILY     insulin aspart 100 UNIT/ML FlexPen  Commonly known as:  NOVOLOG FLEXPEN  INJECT 14 UNITS INTO THE SKIN THREE TIMES DAILY WITH MEALS.     Insulin Glargine 100 UNIT/ML Solostar Pen  Commonly known as:  LANTUS  Inject 20 Units into the skin daily at 10 pm.     Insulin Pen Needle 32G X 4 MM Misc  Commonly known as:  RELION PEN NEEDLES  1 each by Does not apply route 2 (two) times daily as needed.     isosorbide mononitrate 60 MG 24 hr tablet  Commonly known  as:  IMDUR  Take 1 tablet (60 mg total) by mouth daily.  labetalol 200 MG tablet  Commonly known as:  NORMODYNE  TAKE 1 TABLET (200 MG TOTAL)  BY MOUTH 2 (TWO) TIMES DAILY.     latanoprost 0.005 % ophthalmic solution  Commonly known as:  XALATAN  Place 1 drop into both eyes at bedtime.     nitroGLYCERIN 0.4 MG SL tablet  Commonly known as:  NITROSTAT  Place 1 tablet (0.4 mg total) under the tongue every 5 (five) minutes x 3 doses as needed for chest pain.     pentoxifylline 400 MG CR tablet  Commonly known as:  TRENTAL  TAKE 1 TABLET (400 MG TOTAL)  BY MOUTH 2 (TWO) TIMES DAILY AT 10 AMAND  5 PM.     simvastatin 20 MG tablet  Commonly known as:  ZOCOR  TAKE 1 TABLET (20 MG TOTAL) BY MOUTH AT BEDTIME.     SYRINGE-NEEDLE (DISP) 3 ML 25G X 1" 3 ML Misc  1 each by Does not apply route every 14 (fourteen) days.     timolol 0.25 % ophthalmic solution  Commonly known as:  TIMOPTIC  Place 1 drop into both eyes daily.     traZODone 50 MG tablet  Commonly known as:  DESYREL  Take 50 mg by mouth at bedtime as needed for sleep.         Duration of Discharge Encounter: Greater than 30 minutes including physician time.  Angelena Form PA-C 09/12/2014 11:02 AM   Attending Note:   The patient was seen and examined.  Agree with assessment and plan as noted above.  Changes made to the above note as needed.  Pt refuses to consider dialysis. Would not consider cath given his severe CKD   Follow up with Dr. Tamala Julian next week.   Thayer Headings, Brooke Bonito., MD, [facility] 09/12/2014, 6:30 PM [address],  [place] Pager [phone]

## 2014-09-12 NOTE — Discharge Instructions (Signed)
Angina Pectoris  Angina pectoris, often just called angina, is extreme discomfort in your chest, neck, or arm caused by a lack of blood in the middle and thickest layer of your heart wall (myocardium). It may feel like tightness or heavy pressure. It may feel like a crushing or squeezing pain. Some people say it feels like gas or indigestion. It may go down your shoulders, back, and arms. Some people may have symptoms other than pain. These symptoms include fatigue, shortness of breath, cold sweats, or nausea. There are four different types of angina:  · Stable angina--Stable angina usually occurs in episodes of predictable frequency and duration. It usually is brought on by physical activity, emotional stress, or excitement. These are all times when the myocardium needs more oxygen. Stable angina usually lasts a few minutes and often is relieved by taking a medicine that can be taken under your tongue (sublingually). The medicine is called nitroglycerin. Stable angina is caused by a buildup of plaque inside the arteries, which restricts blood flow to the heart muscle (atherosclerosis).  · Unstable angina--Unstable angina can occur even when your body experiences little or no physical exertion. It can occur during sleep. It can also occur at rest. It can suddenly increase in severity or frequency. It might not be relieved by sublingual nitroglycerin. It can last up to 30 minutes. The most common cause of unstable angina is a blood clot that has developed on the top of plaque buildup inside a coronary artery. It can lead to a heart attack if the blood clot completely blocks the artery.  · Microvascular angina--This type of angina is caused by a disorder of tiny blood vessels called arterioles. Microvascular angina is more common in women. The pain may be more severe and last longer than other types of angina pectoris.  · Prinzmetal or variant angina--This type of angina pectoris usually occurs when your body  experiences little or no physical exertion. It especially occurs in the early morning hours. It is caused by a spasm of your coronary artery.  HOME CARE INSTRUCTIONS   · Only take over-the-counter and prescription medicines as directed by your health care provider.  · Stay active or increase your exercise as directed by your health care provider.  · Limit strenuous activity as directed by your health care provider.  · Limit heavy lifting as directed by your health care provider.  · Maintain a healthy weight.  · Learn about and eat heart-healthy foods.  · Do not use any tobacco products including cigarettes, chewing tobacco or electronic cigarettes.  SEEK IMMEDIATE MEDICAL CARE IF:   You experience the following symptoms:  · Chest, neck, deep shoulder, or arm pain or discomfort that lasts more than a few minutes.  · Chest, neck, deep shoulder, or arm pain or discomfort that goes away and comes back, repeatedly.  · Heavy sweating with discomfort, without a noticeable cause.  · Shortness of breath or difficulty breathing.  · Angina that does not get better after a few minutes of rest or after taking sublingual nitroglycerin.  These can all be symptoms of a heart attack, which is a medical emergency! Get medical help at once. Call your local emergency service (911 in [place] immediately. Do not  drive yourself to the hospital and do not  wait to for your symptoms to go away.  MAKE SURE YOU:  · Understand these instructions.  · Will watch your condition.  · Will get help right away if you are not   doing well or get worse.  Document Released: 02/14/2005 Document Revised: 02/19/2013 Document Reviewed: 06/18/2013  [employer]® Patient Information ©2015 [employer]. This information is not intended to replace advice given to you by your health care provider. Make sure you discuss any questions you have with your health care provider.

## 2014-09-12 NOTE — Progress Notes (Addendum)
Patient Profile: 81 y/o male, followed by Dr. Tamala Julian, with known CAD dating back to 25 years ago with prior stenting and then CABG in 2003, stage 4 CKD, HTN and T2DM, presenting with complaints of exertional chest pain. Cardiac enzymes mildly elevated with flat trend at 0.06, 0.07, 0.08.  Subjective: No complaints. CP free at rest. No dyspnea.  The patient's son, [name] fluent talk to me today. Had long discussion with the patient and his son and daughter. The patient still refuses to go on dialysis. The fact that his creatinine is 3.5 and he refuses dialysis, I do not think that proceeding with cardiac catheterization is wise. He wishes to be discharged to home.  Objective: Vital signs in last 24 hours: Temp:  [98.4 F (36.9 C)-98.9 F (37.2 C)] 98.9 F (37.2 C) (07/15 0500) Pulse Rate:  [60-66] 66 (07/15 0500) Resp:  [18] 18 (07/15 0500) BP: (121-140)/(42-61) 122/42 mmHg (07/15 0500) SpO2:  [97 %-100 %] 97 % (07/15 0500) Weight:  [80.513 kg (177 lb 8 oz)] 80.513 kg (177 lb 8 oz) (07/15 0500) Last BM Date: 09/08/14  Intake/Output from previous day: 07/14 0701 - 07/15 0700 In: 604.4 [P.O.:240; I.V.:364.4] Out: -  Intake/Output this shift:    Medications Current Facility-Administered Medications  Medication Dose Route Frequency Provider Last Rate Last Dose  . 0.9 %  sodium chloride infusion   Intravenous Continuous Isaiah Serge, NP 10 mL/hr at 09/10/14 2351    . 0.9 %  sodium chloride infusion  250 mL Intravenous PRN Thayer Headings, MD      . 0.9% sodium chloride infusion  3 mL/kg/hr Intravenous Continuous Kimberly B Hammons, RPH       Followed by  . 0.9% sodium chloride infusion  1 mL/kg/hr Intravenous Continuous Kimberly B Hammons, RPH      . acetaminophen (TYLENOL) tablet 650 mg  650 mg Oral Q4H PRN Isaiah Serge, NP      . amLODipine (NORVASC) tablet 10 mg  10 mg Oral Daily Isaiah Serge, NP   10 mg at 09/11/14 1205  . [START ON 09/13/2014] aspirin chewable tablet  81 mg  81 mg Oral Pre-Cath Thayer Headings, MD      . aspirin EC tablet 81 mg  81 mg Oral Daily Isaiah Serge, NP   81 mg at 09/11/14 1206  . bisacodyl (DULCOLAX) EC tablet 5 mg  5 mg Oral Daily PRN Flossie Dibble, MD   5 mg at 09/11/14 2118  . cloNIDine (CATAPRES) tablet 0.1 mg  0.1 mg Oral QHS Isaiah Serge, NP   0.1 mg at 09/11/14 2111  . heparin ADULT infusion 100 units/mL (25000 units/250 mL)  1,200 Units/hr Intravenous Continuous Laren Everts, RPH 12 mL/hr at 09/12/14 0447 1,200 Units/hr at 09/12/14 0447  . insulin glargine (LANTUS) injection 20 Units  20 Units Subcutaneous Q2200 Isaiah Serge, NP   20 Units at 09/11/14 2111  . isosorbide mononitrate (IMDUR) 24 hr tablet 60 mg  60 mg Oral Daily Isaiah Serge, NP   60 mg at 09/11/14 1205  . labetalol (NORMODYNE) tablet 200 mg  200 mg Oral BID Isaiah Serge, NP   200 mg at 09/11/14 2111  . latanoprost (XALATAN) 0.005 % ophthalmic solution 1 drop  1 drop Both Eyes QHS Isaiah Serge, NP   1 drop at 09/11/14 2111  . nitroGLYCERIN (NITROSTAT) SL tablet 0.4 mg  0.4 mg Sublingual Q5 Min x 3 PRN Mickel Baas  Rozell Searing, NP      . ondansetron [facility]) injection 4 mg  4 mg Intravenous Q6H PRN Isaiah Serge, NP      . pentoxifylline (TRENTAL) CR tablet 400 mg  400 mg Oral BID AC Isaiah Serge, NP   400 mg at 09/11/14 1739  . simvastatin (ZOCOR) tablet 20 mg  20 mg Oral q1800 Isaiah Serge, NP   20 mg at 09/11/14 1739  . sodium chloride 0.9 % injection 3 mL  3 mL Intravenous Q12H Thayer Headings, MD      . sodium chloride 0.9 % injection 3 mL  3 mL Intravenous PRN Thayer Headings, MD      . timolol (TIMOPTIC) 0.25 % ophthalmic solution 1 drop  1 drop Both Eyes Daily Isaiah Serge, NP   1 drop at 09/11/14 1207  . traZODone (DESYREL) tablet 50 mg  50 mg Oral QHS PRN Isaiah Serge, NP   50 mg at 09/10/14 2250  . zolpidem (AMBIEN) tablet 5 mg  5 mg Oral QHS PRN Burnell Blanks, MD   5 mg at 09/11/14 2118    PE: General appearance: alert,  cooperative and no distress Neck: no carotid bruit and no JVD Lungs: clear to auscultation bilaterally Heart: regular rate and rhythm, S1, S2 normal, no murmur, click, rub or gallop Extremities: no LEE Pulses: 2+ and symmetric Skin: warm and dry Neurologic: Grossly normal  Lab Results:   Recent Labs  09/10/14 2033 09/11/14 0746 09/12/14 0500  WBC 6.6 6.1 6.4  HGB 11.3* 10.6* 9.6*  HCT 33.9* 31.5* 28.6*  PLT 214 214 186   BMET  Recent Labs  09/10/14 1302 09/10/14 2033 09/11/14 0746 09/12/14 0838  NA 140  --  140 138  K 4.4  --  4.4 4.2  CL 107  --  104 107  CO2 24  --  25 24  GLUCOSE 124*  --  128* 140*  BUN 31*  --  33* 40*  CREATININE 3.23* 3.54* 3.46* 3.53*  CALCIUM 9.4  --  9.3 8.9   PT/INR  Recent Labs  09/10/14 2033  LABPROT 13.6  INR 1.02   Cholesterol  Recent Labs  09/11/14 0746  CHOL 133   Cardiac Panel (last 3 results)  Recent Labs  09/10/14 2033 09/11/14 0040 09/11/14 0746  TROPONINI 0.06* 0.07* 0.08*    Studies/Results: Lexiscan NST- results pending   Assessment/Plan  Principal Problem:   Chest pain Active Problems:   Diabetes mellitus with renal complications   Dyslipidemia   Essential hypertension   MYOCARDIAL INFARCTION, HX OF   Coronary atherosclerosis, CABG in 2003   B12 deficiency  1. Chest Pain/CAD: h/o CABG in 2003, now with exertional chest pain. Cardiac enzymes slightly elevated x 3 but with flat trend at 0.06, 0.07, 0.08.   Patient had a Myoview study that reveals anterolateral ischemia. I suspect it is the saphenous vein graft to his first diagonal artery. His creatinine is 3.5 and he absolutely refuses to start on dialysis even if he goes in the kidney failure. Given information, I do not think that proceeding with cardiac catheter station is wise. He wants to talk to Dr. Tamala Julian next week.  Will discharge him to home. We'll make sure that he has plenty of sublingual nitroglycerin.  2. CKD: stage 4 CKD. SCr up  from baseline at 3.46 (baseline ~2.5). ACE-I and diuretic both on hold.  3. HTN: moderately elevated this am in the 123456 systolic  but BP meds held for stress test. Resume amlodipine, clonidine Imdur and labetalol.  4. T2DM: controlled with insulin.   5. DLD: LDL close to goal at 76. HDL is low at 26. Continue simvastatin.   Ambulate DC if he is stable    LOS: 2 days      [name], Brandon Cheng, MD  09/12/2014 10:31 AM    [place],  [place], [place]  [address] Pager [phone] Phone: [phone]; Fax: [phone]   [facility]  [address] [place], [place]  [address] [phone]    Fax [phone]

## 2014-09-24 ENCOUNTER — Encounter: Payer: Self-pay | Admitting: Physician Assistant

## 2014-09-24 ENCOUNTER — Encounter: Payer: Commercial Managed Care - HMO | Admitting: Physician Assistant

## 2014-09-24 ENCOUNTER — Ambulatory Visit (INDEPENDENT_AMBULATORY_CARE_PROVIDER_SITE_OTHER): Payer: Commercial Managed Care - HMO | Admitting: Physician Assistant

## 2014-09-24 VITALS — BP 160/65 | HR 57 | Ht 71.0 in | Wt 177.0 lb

## 2014-09-24 DIAGNOSIS — R079 Chest pain, unspecified: Secondary | ICD-10-CM

## 2014-09-24 DIAGNOSIS — I2510 Atherosclerotic heart disease of native coronary artery without angina pectoris: Secondary | ICD-10-CM

## 2014-09-24 DIAGNOSIS — E785 Hyperlipidemia, unspecified: Secondary | ICD-10-CM

## 2014-09-24 DIAGNOSIS — N184 Chronic kidney disease, stage 4 (severe): Secondary | ICD-10-CM

## 2014-09-24 DIAGNOSIS — I10 Essential (primary) hypertension: Secondary | ICD-10-CM

## 2014-09-24 NOTE — Assessment & Plan Note (Signed)
Pressure is elevated and the office. However, at home it was [id number] systolic. He could not remember the bottom number. Asked him to monitor his blood pressure once daily alternating the time of day, for the next couple weeks. If he is consistently above 130/80,  He can call and we'll make adjustments to his medications.

## 2014-09-24 NOTE — Assessment & Plan Note (Signed)
Continue statin Lipid Panel     Component Value Date/Time   CHOL 133 09/11/2014 0746   TRIG 153* 09/11/2014 0746   HDL 26* 09/11/2014 0746   CHOLHDL 5.1 09/11/2014 0746   VLDL 31 09/11/2014 0746   LDLCALC 76 09/11/2014 0746   LDLDIRECT 93.3 01/22/2013 0854

## 2014-09-24 NOTE — Progress Notes (Signed)
Patient ID: PAWEL SOULES, male   DOB: [birth year], 81 y.o.   MRN: [id number]    Date:  09/24/2014   ID:  ERROLL WILBOURNE, DOB Jun 21, 1931, MRN [id number]  PCP:  [name], MD  Primary Cardiologist:  Tamala Julian  Chief Complaint  Patient presents with  . Shortness of Breath    WITH ACTIVITY     History of Present Illness: [name] is a 81 y.o. male , followed by Dr. Tamala Julian, with known CAD dating back 78 years with prior stenting and then CABG in 2003. He also has stage 4 CKD, HTN and T2DM. He presented 09/10/14 with complaints of exertional chest pain. Cardiac enzymes were mildly elevated with flat trend at 0.06, 0.07, 0.08. After admission Dr Acie Fredrickson had a long discussion with the pt and family. The pt refuses to consider dialysis.  He was discharged on medical Rx. He had a 2-D echocardiogram which revealed an ejection fraction of 55-60%.  She presents for posthospital evaluation. He reports doing quite well. He had no problems with angina. He does have some dyspnea with exertion however does not appear to be limiting his activities. He exercises 2 times per week at the gym doing light weights and aerobic type exercise. The blood pressure is elevated here in the office however he tells me his systolic blood pressure was 124 this morning.  The patient currently denies nausea, vomiting, fever, chest pain, orthopnea, dizziness, PND, cough, congestion, abdominal pain, hematochezia, melena, lower extremity edema, claudication.  Wt Readings from Last 3 Encounters:  09/24/14 177 lb (80.287 kg)  09/12/14 177 lb 8 oz (80.513 kg)  07/02/14 184 lb (83.462 kg)     Past Medical History  Diagnosis Date  . CAD (coronary artery disease)   . Hyperlipidemia   . Hypertension   . Myocardial infarction 1998  . Heart murmur   . Type II diabetes mellitus   . History of stomach ulcers   . Arthritis     "touch in my fingers" (09/10/2014)  . Renal insufficiency   . Acute renal  failure     [name] 09/10/2014  . B12 deficiency anemia     "stopped taking the shots in ~ 06/2014" (09/10/2014)    Current Outpatient Prescriptions  Medication Sig Dispense Refill  . ACCU-CHEK AVIVA PLUS test strip USE TWICE DAILY AS NEEDED 100 each 12  . ACCU-CHEK SOFTCLIX LANCETS lancets 1 each by Other route 2 (two) times daily as needed for other. 200 each 12  . acetaminophen (TYLENOL) 500 MG tablet Take 500 mg by mouth daily as needed for mild pain or headache.    . Alcohol Swabs (B-D SINGLE USE SWABS REGULAR) PADS USE AS DIRECTED 100 each 12  . amLODipine (NORVASC) 10 MG tablet TAKE 1 TABLET DAILY 90 tablet 3  . aspirin EC 81 MG tablet Take 81 mg by mouth daily.    . Blood Glucose Calibration (ACCU-CHEK AVIVA) SOLN USE AS DIRECTED 3 each 3  . Blood Glucose Monitoring Suppl (ACCU-CHEK AVIVA PLUS) W/DEVICE KIT USE AS DIRECTED 1 kit 0  . cloNIDine (CATAPRES) 0.1 MG tablet TAKE 1 TABLET AT BEDTIME 90 tablet 3  . furosemide (LASIX) 40 MG tablet TAKE ONE TABLET BY MOUTH DAILY 90 tablet 3  . insulin aspart (NOVOLOG FLEXPEN) 100 UNIT/ML FlexPen INJECT 14 UNITS INTO THE SKIN THREE TIMES DAILY WITH MEALS. 15 pen 3  . Insulin Glargine (LANTUS) 100 UNIT/ML Solostar Pen Inject 20 Units into the skin daily at 10 pm. 5  pen 3  . Insulin Pen Needle (RELION PEN NEEDLES) 32G X 4 MM MISC 1 each by Does not apply route 2 (two) times daily as needed. 200 each 4  . isosorbide mononitrate (IMDUR) 60 MG 24 hr tablet Take 1 tablet (60 mg total) by mouth daily. 30 tablet 11  . labetalol (NORMODYNE) 200 MG tablet TAKE 1 TABLET (200 MG TOTAL)  BY MOUTH 2 (TWO) TIMES DAILY. 180 tablet 3  . latanoprost (XALATAN) 0.005 % ophthalmic solution Place 1 drop into both eyes at bedtime.     . nitroGLYCERIN (NITROSTAT) 0.4 MG SL tablet Place 1 tablet (0.4 mg total) under the tongue every 5 (five) minutes x 3 doses as needed for chest pain. 25 tablet 2  . pentoxifylline (TRENTAL) 400 MG CR tablet TAKE 1 TABLET (400 MG TOTAL)  BY  MOUTH 2 (TWO) TIMES DAILY AT 10 AMAND  5 PM. 180 tablet 3  . simvastatin (ZOCOR) 20 MG tablet TAKE 1 TABLET (20 MG TOTAL) BY MOUTH AT BEDTIME. 90 tablet 3  . SYRINGE-NEEDLE, DISP, 3 ML 25G X 1" 3 ML MISC 1 each by Does not apply route every 14 (fourteen) days. 50 each 3  . timolol (TIMOPTIC) 0.25 % ophthalmic solution Place 1 drop into both eyes daily.     . traZODone (DESYREL) 50 MG tablet Take 50 mg by mouth at bedtime as needed for sleep.      No current facility-administered medications for this visit.    Allergies:   No Known Allergies  Social History:  The patient  reports that he has quit smoking. His smoking use included Cigarettes. He has a 10 pack-year smoking history. He has never used smokeless tobacco. He reports that he does not drink alcohol or use illicit drugs.   Family history:   Family History  Problem Relation Age of Onset  . Diabetes      brothers and sisters  . Hypertension Sister   . Coronary artery disease    . Stroke Mother   . Hypertension Mother   . Cancer Mother   . Heart attack Sister     ROS:  Please see the history of present illness.  All other systems reviewed and negative.   PHYSICAL EXAM: VS:  BP 160/65 mmHg  Pulse 57  Ht 5' 11" (1.803 m)  Wt 177 lb (80.287 kg)  BMI 24.70 kg/m2 Well nourished, well developed, in no acute distress HEENT: Pupils are equal round react to light accommodation extraocular movements are intact.  Neck: no JVDNo cervical lymphadenopathy. Cardiac: Regular rate and rhythm with 1/6 systolic murmur at LSB. Lungs:  clear to auscultation bilaterally, no wheezing, rhonchi or rales Abd: soft, nontender, positive bowel sounds all quadrants, no hepatosplenomegaly Ext: no lower extremity edema.  2+ radial and dorsalis pedis pulses. Skin: warm and dry Neuro:  Grossly normal  EKG:  Sinus bradycardia first degree AV block rate 57 bpm. Inferior Q waves and lateral T-wave inversion  ASSESSMENT AND PLAN:  Problem List Items  Addressed This Visit    Essential hypertension     Pressure is elevated and the office. However, at home it was 762 systolic. He could not remember the bottom number. Asked him to monitor his blood pressure once daily alternating the time of day, for the next couple weeks. If he is consistently above 130/80,  He can call and we'll make adjustments to his medications.      Relevant Orders   EKG 12-Lead   Dyslipidemia  Continue statin Lipid Panel     Component Value Date/Time   CHOL 133 09/11/2014 0746   TRIG 153* 09/11/2014 0746   HDL 26* 09/11/2014 0746   CHOLHDL 5.1 09/11/2014 0746   VLDL 31 09/11/2014 0746   LDLCALC 76 09/11/2014 0746   LDLDIRECT 93.3 01/22/2013 0854          Coronary atherosclerosis, CABG in 2003     No complaints of angina.   He is on aspirin, Imdur 60 mg and labetalol.        Chronic renal disease, stage IV-pt declines dialysis   Chest pain - Primary   Relevant Orders   EKG 12-Lead

## 2014-09-24 NOTE — Assessment & Plan Note (Signed)
No complaints of angina.   He is on aspirin, Imdur 60 mg and labetalol.

## 2014-09-24 NOTE — Patient Instructions (Signed)
Medication Instructions:   Your physician recommends that you continue on your current medications as directed. Please refer to the Current Medication list given to you today.   Labwork:   Testing/Procedures:   Follow-Up:   IN 3 MONTHS WITH DR Tamala Julian    Any Other Special Instructions Will Be Listed Below (If Applicable).  PLEASE MONITOR BLOOD PRESSURE AT ONE TAKING IT ONCE A DAY .   PLEASE CONTACT THE OFFICE IF BLOOD PRESSURE CONTINUES TO BE OVER 130/80S ASK TO SPEAK TO A NURSE TRIAGE

## 2014-09-26 ENCOUNTER — Telehealth: Payer: Self-pay | Admitting: *Deleted

## 2014-09-26 NOTE — Telephone Encounter (Signed)
Pt wanted refills sent to [facility] that were ordered by Cardiology. Told pt would need to contact Cardiolology. Pt verbalized understanding.

## 2014-09-29 ENCOUNTER — Other Ambulatory Visit: Payer: Self-pay

## 2014-09-29 MED ORDER — ISOSORBIDE MONONITRATE ER 60 MG PO TB24: 60.0000 mg | ORAL_TABLET | Freq: Every day | ORAL | Status: DC

## 2014-10-01 ENCOUNTER — Encounter: Payer: Self-pay | Admitting: Internal Medicine

## 2014-10-01 ENCOUNTER — Ambulatory Visit (INDEPENDENT_AMBULATORY_CARE_PROVIDER_SITE_OTHER): Payer: Commercial Managed Care - HMO | Admitting: Internal Medicine

## 2014-10-01 VITALS — BP 140/76 | HR 65 | Temp 98.7°F | Resp 20 | Ht 71.0 in | Wt 180.0 lb

## 2014-10-01 DIAGNOSIS — E0821 Diabetes mellitus due to underlying condition with diabetic nephropathy: Secondary | ICD-10-CM | POA: Diagnosis not present

## 2014-10-01 DIAGNOSIS — I252 Old myocardial infarction: Secondary | ICD-10-CM | POA: Diagnosis not present

## 2014-10-01 DIAGNOSIS — E785 Hyperlipidemia, unspecified: Secondary | ICD-10-CM | POA: Diagnosis not present

## 2014-10-01 DIAGNOSIS — I739 Peripheral vascular disease, unspecified: Secondary | ICD-10-CM | POA: Diagnosis not present

## 2014-10-01 DIAGNOSIS — I10 Essential (primary) hypertension: Secondary | ICD-10-CM | POA: Diagnosis not present

## 2014-10-01 LAB — HEMOGLOBIN A1C: Hgb A1c MFr Bld: 6.8 % — ABNORMAL HIGH (ref 4.6–6.5)

## 2014-10-01 NOTE — Progress Notes (Signed)
Subjective:    Patient ID: [name], male    DOB: 1931-08-21, 81 y.o.   MRN: [id number]  HPI  Lab Results  Component Value Date   HGBA1C 7.6* 07/02/2014   81 year old patient who is seen today for follow-up.  He has coronary artery disease was hospitalized last month for chest pain.  He has a chronic kidney disease and cardiac cath was deferred due to his renal insufficiency.  Since his discharge.  She has done well. He has essential hypertension and type 2 diabetes He states fasting blood sugars are generally in the 125 range.  He states blood sugars throughout the day are generally lower.  He is on basal bolus insulin which includes 14 units prior to 2 meals daily  Hospital records reviewed  Past Medical History  Diagnosis Date  . CAD (coronary artery disease)   . Hyperlipidemia   . Hypertension   . Myocardial infarction 1998  . Heart murmur   . Type II diabetes mellitus   . History of stomach ulcers   . Arthritis     "touch in my fingers" (09/10/2014)  . Renal insufficiency   . Acute renal failure     [name] 09/10/2014  . B12 deficiency anemia     "stopped taking the shots in ~ 06/2014" (09/10/2014)    History   Social History  . Marital Status: Widowed    Spouse Name: N/A  . Number of Children: N/A  . Years of Education: N/A   Occupational History  . Not on file.   Social History Main Topics  . Smoking status: Former Smoker -- 0.50 packs/day for 20 years    Types: Cigarettes  . Smokeless tobacco: Never Used     Comment: "quit smoking cigarettes in the 1970's  . Alcohol Use: No  . Drug Use: No  . Sexual Activity: Not Currently   Other Topics Concern  . Not on file   Social History Narrative    Past Surgical History  Procedure Laterality Date  . Esophagogastroduodenoscopy  06/10/2011    Procedure: ESOPHAGOGASTRODUODENOSCOPY (EGD);  Surgeon: Ladene Artist, MD,FACG;  Location: [facility] ENDOSCOPY;  Service: Endoscopy;  Laterality: N/A;  . Coronary  artery bypass graft  2003    CABG X5  . Cardiac catheterization  2003;   . Coronary angioplasty  1998    [name] 07/13/2010  . Cataract extraction, bilateral Bilateral     Family History  Problem Relation Age of Onset  . Diabetes      brothers and sisters  . Hypertension Sister   . Coronary artery disease    . Stroke Mother   . Hypertension Mother   . Cancer Mother   . Heart attack Sister     No Known Allergies  Current Outpatient Prescriptions on File Prior to Visit  Medication Sig Dispense Refill  . ACCU-CHEK AVIVA PLUS test strip USE TWICE DAILY AS NEEDED 100 each 12  . ACCU-CHEK SOFTCLIX LANCETS lancets 1 each by Other route 2 (two) times daily as needed for other. 200 each 12  . acetaminophen (TYLENOL) 500 MG tablet Take 500 mg by mouth daily as needed for mild pain or headache.    . Alcohol Swabs (B-D SINGLE USE SWABS REGULAR) PADS USE AS DIRECTED 100 each 12  . amLODipine (NORVASC) 10 MG tablet TAKE 1 TABLET DAILY 90 tablet 3  . aspirin EC 81 MG tablet Take 81 mg by mouth daily.    . Blood Glucose Calibration (ACCU-CHEK AVIVA) SOLN  USE AS DIRECTED 3 each 3  . Blood Glucose Monitoring Suppl (ACCU-CHEK AVIVA PLUS) W/DEVICE KIT USE AS DIRECTED 1 kit 0  . cloNIDine (CATAPRES) 0.1 MG tablet TAKE 1 TABLET AT BEDTIME 90 tablet 3  . furosemide (LASIX) 40 MG tablet TAKE ONE TABLET BY MOUTH DAILY 90 tablet 3  . insulin aspart (NOVOLOG FLEXPEN) 100 UNIT/ML FlexPen INJECT 14 UNITS INTO THE SKIN THREE TIMES DAILY WITH MEALS. 15 pen 3  . Insulin Glargine (LANTUS) 100 UNIT/ML Solostar Pen Inject 20 Units into the skin daily at 10 pm. 5 pen 3  . Insulin Pen Needle (RELION PEN NEEDLES) 32G X 4 MM MISC 1 each by Does not apply route 2 (two) times daily as needed. 200 each 4  . isosorbide mononitrate (IMDUR) 60 MG 24 hr tablet Take 1 tablet (60 mg total) by mouth daily. 90 tablet 3  . labetalol (NORMODYNE) 200 MG tablet TAKE 1 TABLET (200 MG TOTAL)  BY MOUTH 2 (TWO) TIMES DAILY. 180 tablet 3    . latanoprost (XALATAN) 0.005 % ophthalmic solution Place 1 drop into both eyes at bedtime.     . nitroGLYCERIN (NITROSTAT) 0.4 MG SL tablet Place 1 tablet (0.4 mg total) under the tongue every 5 (five) minutes x 3 doses as needed for chest pain. 25 tablet 2  . pentoxifylline (TRENTAL) 400 MG CR tablet TAKE 1 TABLET (400 MG TOTAL)  BY MOUTH 2 (TWO) TIMES DAILY AT 10 AMAND  5 PM. 180 tablet 3  . simvastatin (ZOCOR) 20 MG tablet TAKE 1 TABLET (20 MG TOTAL) BY MOUTH AT BEDTIME. 90 tablet 3  . SYRINGE-NEEDLE, DISP, 3 ML 25G X 1" 3 ML MISC 1 each by Does not apply route every 14 (fourteen) days. 50 each 3  . timolol (TIMOPTIC) 0.25 % ophthalmic solution Place 1 drop into both eyes daily.      No current facility-administered medications on file prior to visit.    BP 140/76 mmHg  Pulse 65  Temp(Src) 98.7 F (37.1 C) (Oral)  Resp 20  Ht 5' 11"  (1.803 m)  Wt 180 lb (81.647 kg)  BMI 25.12 kg/m2  SpO2 98%     Review of Systems  Constitutional: Negative for fever, chills, appetite change and fatigue.  HENT: Negative for congestion, dental problem, ear pain, hearing loss, sore throat, tinnitus, trouble swallowing and voice change.   Eyes: Negative for pain, discharge and visual disturbance.  Respiratory: Negative for cough, chest tightness, wheezing and stridor.   Cardiovascular: Positive for chest pain. Negative for palpitations and leg swelling.  Gastrointestinal: Negative for nausea, vomiting, abdominal pain, diarrhea, constipation, blood in stool and abdominal distention.  Genitourinary: Negative for urgency, hematuria, flank pain, discharge, difficulty urinating and genital sores.  Musculoskeletal: Negative for myalgias, back pain, joint swelling, arthralgias, gait problem and neck stiffness.  Skin: Negative for rash.  Neurological: Negative for dizziness, syncope, speech difficulty, weakness, numbness and headaches.  Hematological: Negative for adenopathy. Does not bruise/bleed easily.   Psychiatric/Behavioral: Negative for behavioral problems and dysphoric mood. The patient is not nervous/anxious.        Objective:   Physical Exam  Constitutional: He is oriented to person, place, and time. He appears well-developed.  Blood pressure 140/60 bilaterally  HENT:  Head: Normocephalic.  Right Ear: External ear normal.  Left Ear: External ear normal.  Eyes: Conjunctivae and EOM are normal.  Neck: Normal range of motion.  Bilateral bruits versus transmitted murmur  Cardiovascular: Normal rate.   Murmur heard. Occasional ectopics  Grade 2/6 systolic murmur  Pulmonary/Chest: Breath sounds normal.  Abdominal: Bowel sounds are normal.  Musculoskeletal: Normal range of motion. He exhibits no edema or tenderness.  Neurological: He is alert and oriented to person, place, and time.  Psychiatric: He has a normal mood and affect. His behavior is normal.          Assessment & Plan:   Diabetes mellitus.  Will check a hemoglobin A1c.  Consider titration of basal insulin Hypertension.  Reasonable control Coronary artery disease Chronic kidney disease Dyslipidemia.  Continue statin therapy  Recheck 3 months Cardiology and renal follow-up

## 2014-10-01 NOTE — Patient Instructions (Signed)
Limit your sodium (Salt) intake   Please check your hemoglobin A1c every 3 months    It is important that you exercise regularly, at least 20 minutes 3 to 4 times per week.  If you develop chest pain or shortness of breath seek  medical attention.  Return in 3 months for follow-up

## 2014-10-01 NOTE — Progress Notes (Signed)
Pre visit review using our clinic review tool, if applicable. No additional management support is needed unless otherwise documented below in the visit note. 

## 2014-10-06 DIAGNOSIS — N184 Chronic kidney disease, stage 4 (severe): Secondary | ICD-10-CM | POA: Diagnosis not present

## 2014-10-17 ENCOUNTER — Encounter: Payer: Commercial Managed Care - HMO | Admitting: Cardiology

## 2014-11-05 DIAGNOSIS — I10 Essential (primary) hypertension: Secondary | ICD-10-CM | POA: Diagnosis not present

## 2014-11-05 DIAGNOSIS — N184 Chronic kidney disease, stage 4 (severe): Secondary | ICD-10-CM | POA: Diagnosis not present

## 2014-11-05 DIAGNOSIS — I2510 Atherosclerotic heart disease of native coronary artery without angina pectoris: Secondary | ICD-10-CM | POA: Diagnosis not present

## 2014-11-06 ENCOUNTER — Encounter (HOSPITAL_COMMUNITY): Payer: Self-pay | Admitting: Emergency Medicine

## 2014-11-06 ENCOUNTER — Other Ambulatory Visit: Payer: Self-pay | Admitting: Nephrology

## 2014-11-06 ENCOUNTER — Emergency Department (HOSPITAL_COMMUNITY): Payer: Commercial Managed Care - HMO

## 2014-11-06 ENCOUNTER — Inpatient Hospital Stay (HOSPITAL_COMMUNITY)
Admission: EM | Admit: 2014-11-06 | Discharge: 2014-11-12 | DRG: 281 | Disposition: A | Discharge status: Home / Self Care | Payer: Commercial Managed Care - HMO | Attending: Interventional Cardiology | Admitting: Interventional Cardiology

## 2014-11-06 DIAGNOSIS — D631 Anemia in chronic kidney disease: Secondary | ICD-10-CM | POA: Diagnosis not present

## 2014-11-06 DIAGNOSIS — R079 Chest pain, unspecified: Secondary | ICD-10-CM | POA: Diagnosis not present

## 2014-11-06 DIAGNOSIS — E785 Hyperlipidemia, unspecified: Secondary | ICD-10-CM | POA: Diagnosis present

## 2014-11-06 DIAGNOSIS — Z794 Long term (current) use of insulin: Secondary | ICD-10-CM

## 2014-11-06 DIAGNOSIS — E119 Type 2 diabetes mellitus without complications: Secondary | ICD-10-CM | POA: Diagnosis present

## 2014-11-06 DIAGNOSIS — Z9861 Coronary angioplasty status: Secondary | ICD-10-CM | POA: Diagnosis not present

## 2014-11-06 DIAGNOSIS — I252 Old myocardial infarction: Secondary | ICD-10-CM

## 2014-11-06 DIAGNOSIS — I10 Essential (primary) hypertension: Secondary | ICD-10-CM

## 2014-11-06 DIAGNOSIS — I25709 Atherosclerosis of coronary artery bypass graft(s), unspecified, with unspecified angina pectoris: Secondary | ICD-10-CM | POA: Diagnosis present

## 2014-11-06 DIAGNOSIS — I129 Hypertensive chronic kidney disease with stage 1 through stage 4 chronic kidney disease, or unspecified chronic kidney disease: Secondary | ICD-10-CM | POA: Diagnosis not present

## 2014-11-06 DIAGNOSIS — N184 Chronic kidney disease, stage 4 (severe): Secondary | ICD-10-CM | POA: Diagnosis present

## 2014-11-06 DIAGNOSIS — D638 Anemia in other chronic diseases classified elsewhere: Secondary | ICD-10-CM | POA: Diagnosis not present

## 2014-11-06 DIAGNOSIS — I2582 Chronic total occlusion of coronary artery: Secondary | ICD-10-CM | POA: Diagnosis present

## 2014-11-06 DIAGNOSIS — Z79899 Other long term (current) drug therapy: Secondary | ICD-10-CM

## 2014-11-06 DIAGNOSIS — I120 Hypertensive chronic kidney disease with stage 5 chronic kidney disease or end stage renal disease: Secondary | ICD-10-CM | POA: Diagnosis not present

## 2014-11-06 DIAGNOSIS — I214 Non-ST elevation (NSTEMI) myocardial infarction: Principal | ICD-10-CM | POA: Insufficient documentation

## 2014-11-06 DIAGNOSIS — I2510 Atherosclerotic heart disease of native coronary artery without angina pectoris: Secondary | ICD-10-CM | POA: Diagnosis not present

## 2014-11-06 DIAGNOSIS — Z87891 Personal history of nicotine dependence: Secondary | ICD-10-CM | POA: Diagnosis not present

## 2014-11-06 DIAGNOSIS — Z7982 Long term (current) use of aspirin: Secondary | ICD-10-CM | POA: Diagnosis not present

## 2014-11-06 DIAGNOSIS — E871 Hypo-osmolality and hyponatremia: Secondary | ICD-10-CM | POA: Diagnosis not present

## 2014-11-06 DIAGNOSIS — I219 Acute myocardial infarction, unspecified: Secondary | ICD-10-CM | POA: Diagnosis present

## 2014-11-06 DIAGNOSIS — I25110 Atherosclerotic heart disease of native coronary artery with unstable angina pectoris: Secondary | ICD-10-CM | POA: Diagnosis not present

## 2014-11-06 DIAGNOSIS — R0789 Other chest pain: Secondary | ICD-10-CM | POA: Diagnosis not present

## 2014-11-06 DIAGNOSIS — N179 Acute kidney failure, unspecified: Secondary | ICD-10-CM | POA: Diagnosis not present

## 2014-11-06 DIAGNOSIS — R778 Other specified abnormalities of plasma proteins: Secondary | ICD-10-CM | POA: Diagnosis present

## 2014-11-06 DIAGNOSIS — R7989 Other specified abnormal findings of blood chemistry: Secondary | ICD-10-CM | POA: Diagnosis present

## 2014-11-06 DIAGNOSIS — E1122 Type 2 diabetes mellitus with diabetic chronic kidney disease: Secondary | ICD-10-CM | POA: Diagnosis not present

## 2014-11-06 DIAGNOSIS — R748 Abnormal levels of other serum enzymes: Secondary | ICD-10-CM | POA: Diagnosis not present

## 2014-11-06 DIAGNOSIS — I2119 ST elevation (STEMI) myocardial infarction involving other coronary artery of inferior wall: Secondary | ICD-10-CM | POA: Diagnosis not present

## 2014-11-06 DIAGNOSIS — I25810 Atherosclerosis of coronary artery bypass graft(s) without angina pectoris: Secondary | ICD-10-CM | POA: Diagnosis not present

## 2014-11-06 DIAGNOSIS — K279 Peptic ulcer, site unspecified, unspecified as acute or chronic, without hemorrhage or perforation: Secondary | ICD-10-CM | POA: Diagnosis present

## 2014-11-06 DIAGNOSIS — E1129 Type 2 diabetes mellitus with other diabetic kidney complication: Secondary | ICD-10-CM | POA: Diagnosis not present

## 2014-11-06 HISTORY — DX: Other specified diabetes mellitus without complications: E13.9

## 2014-11-06 LAB — GLUCOSE, CAPILLARY: GLUCOSE-CAPILLARY: 159 mg/dL — AB (ref 65–99)

## 2014-11-06 LAB — CBC WITH DIFFERENTIAL/PLATELET
BASOS ABS: 0 10*3/uL (ref 0.0–0.1)
BASOS PCT: 0 % (ref 0–1)
EOS ABS: 0 10*3/uL (ref 0.0–0.7)
EOS PCT: 0 % (ref 0–5)
HCT: 35.1 % — ABNORMAL LOW (ref 39.0–52.0)
Hemoglobin: 11.7 g/dL — ABNORMAL LOW (ref 13.0–17.0)
Lymphocytes Relative: 6 % — ABNORMAL LOW (ref 12–46)
Lymphs Abs: 1 10*3/uL (ref 0.7–4.0)
MCH: 29.7 pg (ref 26.0–34.0)
MCHC: 33.3 g/dL (ref 30.0–36.0)
MCV: 89.1 fL (ref 78.0–100.0)
MONO ABS: 1.4 10*3/uL — AB (ref 0.1–1.0)
Monocytes Relative: 8 % (ref 3–12)
Neutro Abs: 14 10*3/uL — ABNORMAL HIGH (ref 1.7–7.7)
Neutrophils Relative %: 86 % — ABNORMAL HIGH (ref 43–77)
PLATELETS: 230 10*3/uL (ref 150–400)
RBC: 3.94 MIL/uL — AB (ref 4.22–5.81)
RDW: 14 % (ref 11.5–15.5)
WBC: 16.4 10*3/uL — AB (ref 4.0–10.5)

## 2014-11-06 LAB — BASIC METABOLIC PANEL
ANION GAP: 11 (ref 5–15)
BUN: 39 mg/dL — ABNORMAL HIGH (ref 6–20)
CO2: 25 mmol/L (ref 22–32)
Calcium: 10.1 mg/dL (ref 8.9–10.3)
Chloride: 101 mmol/L (ref 101–111)
Creatinine, Ser: 3.54 mg/dL — ABNORMAL HIGH (ref 0.61–1.24)
GFR calc Af Amer: 17 mL/min — ABNORMAL LOW (ref >60)
GFR, EST NON AFRICAN AMERICAN: 15 mL/min — AB (ref >60)
GLUCOSE: 154 mg/dL — AB (ref 65–99)
POTASSIUM: 5 mmol/L (ref 3.5–5.1)
SODIUM: 137 mmol/L (ref 135–145)

## 2014-11-06 LAB — TROPONIN I: Troponin I: >65 ng/mL (ref <0.031)

## 2014-11-06 LAB — MRSA PCR SCREENING: MRSA by PCR: NEGATIVE

## 2014-11-06 MED ORDER — SODIUM CHLORIDE 0.9 % WEIGHT BASED INFUSION: 1.0000 mL/kg/h | INTRAVENOUS | Status: DC

## 2014-11-06 MED ORDER — INSULIN ASPART 100 UNIT/ML ~~LOC~~ SOLN
0.0000 [IU] | Freq: Three times a day (TID) | SUBCUTANEOUS | Status: DC
  Administered 2014-11-07: 3 [IU] via SUBCUTANEOUS
  Administered 2014-11-07: 2 [IU] via SUBCUTANEOUS
  Administered 2014-11-08: 3 [IU] via SUBCUTANEOUS
  Administered 2014-11-08: 2 [IU] via SUBCUTANEOUS
  Administered 2014-11-08 – 2014-11-09 (×3): 3 [IU] via SUBCUTANEOUS
  Administered 2014-11-09 – 2014-11-10 (×2): 2 [IU] via SUBCUTANEOUS
  Administered 2014-11-10: 3 [IU] via SUBCUTANEOUS
  Administered 2014-11-10: 2 [IU] via SUBCUTANEOUS
  Administered 2014-11-11: 3 [IU] via SUBCUTANEOUS
  Administered 2014-11-11 (×2): 2 [IU] via SUBCUTANEOUS
  Administered 2014-11-12 (×2): 3 [IU] via SUBCUTANEOUS

## 2014-11-06 MED ORDER — SODIUM CHLORIDE 0.9 % IJ SOLN
3.0000 mL | Freq: Two times a day (BID) | INTRAMUSCULAR | Status: DC
  Administered 2014-11-06 – 2014-11-12 (×5): 3 mL via INTRAVENOUS

## 2014-11-06 MED ORDER — ACETAMINOPHEN 325 MG PO TABS
650.0000 mg | ORAL_TABLET | ORAL | Status: DC | PRN
  Administered 2014-11-07 – 2014-11-09 (×2): 650 mg via ORAL
  Filled 2014-11-06 (×2): qty 2

## 2014-11-06 MED ORDER — SODIUM CHLORIDE 0.9 % IJ SOLN: 3.0000 mL | INTRAMUSCULAR | Status: DC | PRN

## 2014-11-06 MED ORDER — ASPIRIN 300 MG RE SUPP: 300.0000 mg | RECTAL | Status: AC

## 2014-11-06 MED ORDER — NITROGLYCERIN IN D5W 200-5 MCG/ML-% IV SOLN
0.0000 ug/min | Freq: Once | INTRAVENOUS | Status: AC
  Administered 2014-11-06: 5 ug/min via INTRAVENOUS
  Filled 2014-11-06: qty 250

## 2014-11-06 MED ORDER — TIMOLOL MALEATE 0.25 % OP SOLN
1.0000 [drp] | Freq: Every day | OPHTHALMIC | Status: DC
  Administered 2014-11-07 – 2014-11-12 (×6): 1 [drp] via OPHTHALMIC
  Filled 2014-11-06 (×2): qty 5

## 2014-11-06 MED ORDER — ONDANSETRON HCL 4 MG/2ML IJ SOLN: 4.0000 mg | Freq: Four times a day (QID) | INTRAMUSCULAR | Status: DC | PRN

## 2014-11-06 MED ORDER — NITROGLYCERIN 0.4 MG SL SUBL: 0.4000 mg | SUBLINGUAL_TABLET | SUBLINGUAL | Status: DC | PRN

## 2014-11-06 MED ORDER — ASPIRIN EC 81 MG PO TBEC
81.0000 mg | DELAYED_RELEASE_TABLET | Freq: Every day | ORAL | Status: DC
  Administered 2014-11-07 – 2014-11-12 (×6): 81 mg via ORAL
  Filled 2014-11-06 (×6): qty 1

## 2014-11-06 MED ORDER — SODIUM CHLORIDE 0.9 % WEIGHT BASED INFUSION
1.0000 mL/kg/h | INTRAVENOUS | Status: DC
  Administered 2014-11-06: 1 mL/kg/h via INTRAVENOUS

## 2014-11-06 MED ORDER — LABETALOL HCL 200 MG PO TABS
200.0000 mg | ORAL_TABLET | Freq: Two times a day (BID) | ORAL | Status: DC
  Administered 2014-11-06 – 2014-11-09 (×6): 200 mg via ORAL
  Filled 2014-11-06 (×6): qty 1

## 2014-11-06 MED ORDER — ALPRAZOLAM 0.25 MG PO TABS
0.2500 mg | ORAL_TABLET | Freq: Two times a day (BID) | ORAL | Status: DC | PRN
  Administered 2014-11-07 – 2014-11-11 (×4): 0.25 mg via ORAL
  Filled 2014-11-06 (×4): qty 1

## 2014-11-06 MED ORDER — HEPARIN BOLUS VIA INFUSION
4000.0000 [IU] | Freq: Once | INTRAVENOUS | Status: AC
  Administered 2014-11-06: 4000 [IU] via INTRAVENOUS
  Filled 2014-11-06: qty 4000

## 2014-11-06 MED ORDER — SODIUM CHLORIDE 0.9 % IV SOLN: 250.0000 mL | INTRAVENOUS | Status: DC | PRN

## 2014-11-06 MED ORDER — NITROGLYCERIN IN D5W 200-5 MCG/ML-% IV SOLN
3.0000 ug/min | INTRAVENOUS | Status: DC
  Administered 2014-11-08: 20 ug/min via INTRAVENOUS
  Filled 2014-11-06: qty 250

## 2014-11-06 MED ORDER — LABETALOL HCL 200 MG PO TABS: 200.0000 mg | ORAL_TABLET | Freq: Two times a day (BID) | ORAL | Status: DC

## 2014-11-06 MED ORDER — ASPIRIN 81 MG PO CHEW: 324.0000 mg | CHEWABLE_TABLET | ORAL | Status: AC

## 2014-11-06 MED ORDER — SIMVASTATIN 20 MG PO TABS
20.0000 mg | ORAL_TABLET | Freq: Every day | ORAL | Status: DC
  Administered 2014-11-06: 20 mg via ORAL
  Filled 2014-11-06: qty 1

## 2014-11-06 MED ORDER — INFLUENZA VAC SPLIT QUAD 0.5 ML IM SUSY
0.5000 mL | PREFILLED_SYRINGE | INTRAMUSCULAR | Status: AC
  Administered 2014-11-09: 0.5 mL via INTRAMUSCULAR
  Filled 2014-11-06: qty 0.5

## 2014-11-06 MED ORDER — ASPIRIN 81 MG PO CHEW: 324.0000 mg | CHEWABLE_TABLET | Freq: Once | ORAL | Status: DC

## 2014-11-06 MED ORDER — LATANOPROST 0.005 % OP SOLN
1.0000 [drp] | Freq: Every day | OPHTHALMIC | Status: DC
  Administered 2014-11-06 – 2014-11-11 (×6): 1 [drp] via OPHTHALMIC
  Filled 2014-11-06: qty 2.5

## 2014-11-06 MED ORDER — ZOLPIDEM TARTRATE 5 MG PO TABS
5.0000 mg | ORAL_TABLET | Freq: Every evening | ORAL | Status: DC | PRN
  Administered 2014-11-06 – 2014-11-11 (×6): 5 mg via ORAL
  Filled 2014-11-06 (×6): qty 1

## 2014-11-06 MED ORDER — AMLODIPINE BESYLATE 10 MG PO TABS
10.0000 mg | ORAL_TABLET | Freq: Every day | ORAL | Status: DC
  Administered 2014-11-07 – 2014-11-12 (×6): 10 mg via ORAL
  Filled 2014-11-06 (×6): qty 1

## 2014-11-06 MED ORDER — INSULIN ASPART 100 UNIT/ML ~~LOC~~ SOLN
0.0000 [IU] | Freq: Every day | SUBCUTANEOUS | Status: DC
Start: 2014-11-06 — End: 2014-11-12

## 2014-11-06 MED ORDER — HEPARIN (PORCINE) IN NACL 100-0.45 UNIT/ML-% IJ SOLN
12.0000 [IU]/kg/h | INTRAMUSCULAR | Status: DC
  Administered 2014-11-06: 12 [IU]/kg/h via INTRAVENOUS
  Filled 2014-11-06 (×2): qty 250

## 2014-11-06 NOTE — Consult Note (Signed)
PHARMACY NOTE  Consult :  Heparin Indication :  ACS  Heparin Dosing Wt :  81.6 kg  LABS :  Recent Labs  11/06/14 1458  NA 137  K 5.0  CL 101  CO2 25  GLUCOSE 154*  BUN 39*  CREATININE 3.54*  CALCIUM 10.1   Cardiac Enzymes:  Recent Labs  11/06/14 1458  TROPONINI >65.00*   MEDICATION: Medication PTA: pending Scheduled:  Scheduled:  . heparin  4,000 Units Intravenous Once   Infusion[s]: Infusions:  . heparin 12 Units/kg/hr (11/06/14 1602)   Antibiotic[s]: Anti-infectives    None      ASSESSMENT :  81 y.o. male is admitted with chest pain and weakness.   Troponin > 65.   Heparin to be started per Pharmacy Consult.  GOAL :  Heparin Level  0.3 - 0.7 units/ml  PLAN : 1. Heparin bolus 4000 units IV now, then begin Heparin infusion at 1000 units/hr.  The next Heparin Level will be drawn in 8 hours.    2. Daily Heparin level, CBC while on Heparin.  Monitor for bleeding complications. Follow Platelet counts.  Marthenia Rolling,  Pharm.D   11/06/2014,  3:59 PM

## 2014-11-06 NOTE — ED Notes (Signed)
Attempted report 

## 2014-11-06 NOTE — Consult Note (Signed)
Reason for Consult:AKI/CKD Referring Physician: Tamala Julian, MD  [name] is an 81 y.o. male.  HPI: Pt is an 81 yo AAM with PMH sig for CAD, s/p CABG x 5, HTN, hyperlipidemia, DM, PVD, and advanced CKD stage 4 (baseline Scr 3.1-3.4) who was recently admitted in July 2016 with unstable angina and a high risk myoview but deferred left heart cath due to risk of contrast induced nephropathy and his wish not to pursue renal replacement therapy.  He now presents from home with a 1 day h/o SSCP and left arm pain that has been constant for the last 24 hours and an acute NSTEMI by EKG.  His SSCP was relieved by IV ntg gtt.  We have been asked to help manage his advanced CKD in face of pending left heart catheterization but again reports that he does not want hemodialysis and is aware of the consequences.  He is not on an ACE/ARB due to advanced CKD and does not have vascular access due to his wishes not to pursue RRT.  Trend in Creatinine: CREATININE, SER  Date/Time Value Ref Range Status  11/06/2014 02:58 PM 3.54* 0.61 - 1.24 mg/dL Final  09/12/2014 08:38 AM 3.53* 0.61 - 1.24 mg/dL Final  09/11/2014 07:46 AM 3.46* 0.61 - 1.24 mg/dL Final  09/10/2014 08:33 PM 3.54* 0.61 - 1.24 mg/dL Final  09/10/2014 01:02 PM 3.23* 0.61 - 1.24 mg/dL Final  03/28/2014 10:21 AM 2.85* 0.40 - 1.50 mg/dL Final  01/22/2013 08:54 AM 2.4* 0.4 - 1.5 mg/dL Final  07/04/2011 10:05 AM 2.4* 0.4 - 1.5 mg/dL Final  06/11/2011 06:00 AM 2.00* 0.50 - 1.35 mg/dL Final  06/10/2011 06:00 AM 2.41* 0.50 - 1.35 mg/dL Final  06/09/2011 02:39 PM 3.00* 0.50 - 1.35 mg/dL Final  04/15/2011 01:39 PM 2.2* 0.4 - 1.5 mg/dL Final  06/11/2010 08:55 AM 3.0* 0.4 - 1.5 mg/dL Final  05/29/2009 01:10 PM 3.5* 0.4-1.5 mg/dL Final  12/19/2008 02:32 PM 3.3* 0.4-1.5 mg/dL Final  03/17/2008 12:01 PM 2.2* 0.4-1.5 mg/dL Final  12/07/2007 01:03 PM 2.2* 0.4-1.5 mg/dL Final  06/01/2007 01:33 PM 2.4* 0.4-1.5 mg/dL Final  01/12/2007 09:18 AM 2.1* 0.4-1.5 mg/dL  Final  12/30/2005 03:38 PM 1.9* 0.4-1.5 mg/dL Final    PMH:   Past Medical History  Diagnosis Date  . CAD (coronary artery disease)   . Hyperlipidemia   . Hypertension   . Myocardial infarction 1998  . Heart murmur   . Type II diabetes mellitus   . History of stomach ulcers   . Arthritis     "touch in my fingers" (09/10/2014)  . Renal insufficiency   . Acute renal failure     [name] 09/10/2014  . B12 deficiency anemia     "stopped taking the shots in ~ 06/2014" (09/10/2014)  . Diabetes 1.5, managed as type 1     PSH:   Past Surgical History  Procedure Laterality Date  . Esophagogastroduodenoscopy  06/10/2011    Procedure: ESOPHAGOGASTRODUODENOSCOPY (EGD);  Surgeon: Ladene Artist, MD,FACG;  Location: [facility] ENDOSCOPY;  Service: Endoscopy;  Laterality: N/A;  . Coronary artery bypass graft  2003    CABG X5  . Cardiac catheterization  2003;   . Coronary angioplasty  1998    [name] 07/13/2010  . Cataract extraction, bilateral Bilateral     Allergies: No Known Allergies  Medications:   Prior to Admission medications   Medication Sig Start Date End Date Taking? Authorizing Provider  ACCU-CHEK AVIVA PLUS test strip USE TWICE DAILY AS NEEDED 06/09/14  Yes Marletta Lor, MD  ACCU-CHEK SOFTCLIX LANCETS lancets 1 each by Other route 2 (two) times daily as needed for other. 05/29/13  Yes Marletta Lor, MD  acetaminophen (TYLENOL) 500 MG tablet Take 500 mg by mouth daily as needed for mild pain or headache.   Yes Historical Provider, MD  Alcohol Swabs (B-D SINGLE USE SWABS REGULAR) PADS USE AS DIRECTED 05/29/13  Yes Marletta Lor, MD  amLODipine (NORVASC) 10 MG tablet TAKE 1 TABLET DAILY 03/28/14  Yes Marletta Lor, MD  aspirin EC 81 MG tablet Take 81 mg by mouth daily.   Yes Historical Provider, MD  Blood Glucose Calibration (ACCU-CHEK AVIVA) SOLN USE AS DIRECTED 05/29/13  Yes Marletta Lor, MD  Blood Glucose Monitoring Suppl (ACCU-CHEK AVIVA PLUS) W/DEVICE KIT USE  AS DIRECTED 09/24/13  Yes Marletta Lor, MD  cloNIDine (CATAPRES) 0.1 MG tablet TAKE 1 TABLET AT BEDTIME 06/23/14  Yes Marletta Lor, MD  furosemide (LASIX) 40 MG tablet TAKE ONE TABLET BY MOUTH DAILY 03/28/14  Yes Marletta Lor, MD  insulin aspart (NOVOLOG FLEXPEN) 100 UNIT/ML FlexPen INJECT 14 UNITS INTO THE SKIN THREE TIMES DAILY WITH MEALS. 09/12/14  Yes Luke K Kilroy, PA-C  Insulin Glargine (LANTUS) 100 UNIT/ML Solostar Pen Inject 20 Units into the skin daily at 10 pm. 09/12/14  Yes Erlene Quan, PA-C  isosorbide mononitrate (IMDUR) 60 MG 24 hr tablet Take 1 tablet (60 mg total) by mouth daily. 09/29/14  Yes Luke K Kilroy, PA-C  labetalol (NORMODYNE) 200 MG tablet TAKE 1 TABLET (200 MG TOTAL)  BY MOUTH 2 (TWO) TIMES DAILY. 03/28/14  Yes Marletta Lor, MD  latanoprost (XALATAN) 0.005 % ophthalmic solution Place 1 drop into both eyes at bedtime.  08/23/14  Yes Historical Provider, MD  nitroGLYCERIN (NITROSTAT) 0.4 MG SL tablet Place 1 tablet (0.4 mg total) under the tongue every 5 (five) minutes x 3 doses as needed for chest pain. 09/12/14  Yes Luke K Kilroy, PA-C  pentoxifylline (TRENTAL) 400 MG CR tablet TAKE 1 TABLET (400 MG TOTAL)  BY MOUTH 2 (TWO) TIMES DAILY AT 10 AMAND  5 PM. 03/28/14  Yes Marletta Lor, MD  simvastatin (ZOCOR) 20 MG tablet TAKE 1 TABLET (20 MG TOTAL) BY MOUTH AT BEDTIME. 03/28/14  Yes Marletta Lor, MD  SYRINGE-NEEDLE, DISP, 3 ML 25G X 1" 3 ML MISC 1 each by Does not apply route every 14 (fourteen) days. 06/27/11  Yes Marletta Lor, MD  timolol (TIMOPTIC) 0.25 % ophthalmic solution Place 1 drop into both eyes daily.  08/23/14  Yes Historical Provider, MD  Insulin Pen Needle (RELION PEN NEEDLES) 32G X 4 MM MISC 1 each by Does not apply route 2 (two) times daily as needed. 10/08/13   Marletta Lor, MD    Inpatient medications: . [START ON 11/07/2014] amLODipine  10 mg Oral Daily  . aspirin  324 mg Oral NOW   Or  . aspirin  300 mg Rectal  NOW  . [START ON 11/07/2014] aspirin EC  81 mg Oral Daily  . insulin aspart  0-15 Units Subcutaneous TID WC  . insulin aspart  0-5 Units Subcutaneous QHS  . labetalol  200 mg Oral BID  . latanoprost  1 drop Both Eyes QHS  . simvastatin  20 mg Oral QHS  . sodium chloride  3 mL Intravenous Q12H  . timolol  1 drop Both Eyes Daily    Discontinued Meds:   Medications Discontinued During  This Encounter  Medication Reason  . aspirin chewable tablet 324 mg   . labetalol (NORMODYNE) tablet 200 mg     Social History:  reports that he has quit smoking. His smoking use included Cigarettes. He has a 10 pack-year smoking history. He has never used smokeless tobacco. He reports that he does not drink alcohol or use illicit drugs.  Family History:   Family History  Problem Relation Age of Onset  . Diabetes      brothers and sisters  . Hypertension Sister   . Coronary artery disease    . Stroke Mother   . Hypertension Mother   . Cancer Mother   . Heart attack Sister     Pertinent items are noted in HPI. Denies any N/V/SOB/dysgeusia/pruritis Weight change:  No intake or output data in the 24 hours ending 11/06/14 1730 BP 131/69 mmHg  Pulse 78  Temp(Src) 98.8 F (37.1 C) (Oral)  Resp 19  Wt 81.6 kg (179 lb 14.3 oz)  SpO2 95% Filed Vitals:   11/06/14 1615 11/06/14 1630 11/06/14 1645 11/06/14 1717  BP: 120/65 140/66 136/62 131/69  Pulse: 80 79 79 78  Temp:      TempSrc:      Resp: _0 Weight:      SpO2: 96% 96% 94% 95%     General appearance: alert, cooperative and no distress Head: Normocephalic, without obvious abnormality, atraumatic Neck: no adenopathy, no carotid bruit, no JVD, supple, symmetrical, trachea midline and thyroid not enlarged, symmetric, no tenderness/mass/nodules Resp: diminished breath sounds bibasilar Cardio: regular rate and rhythm and no rub GI: soft, non-tender; bowel sounds normal; no masses,  no organomegaly Extremities: extremities normal,  atraumatic, no cyanosis or edema  Labs: Basic Metabolic Panel:  Recent Labs Lab 11/06/14 1458  NA 137  K 5.0  CL 101  CO2 25  GLUCOSE 154*  BUN 39*  CREATININE 3.54*  CALCIUM 10.1   Liver Function Tests: No results for input(s): AST, ALT, ALKPHOS, BILITOT, PROT, ALBUMIN in the last 168 hours. No results for input(s): LIPASE, AMYLASE in the last 168 hours. No results for input(s): AMMONIA in the last 168 hours. CBC:  Recent Labs Lab 11/06/14 1458  WBC 16.4*  NEUTROABS 14.0*  HGB 11.7*  HCT 35.1*  MCV 89.1  PLT 230   PT/INR: _1 (inr:5) Cardiac Enzymes: ) Recent Labs Lab 11/06/14 1458  TROPONINI >65.00*   CBG: No results for input(s): GLUCAP in the last 168 hours.  Iron Studies: No results for input(s): IRON, TIBC, TRANSFERRIN, FERRITIN in the last 168 hours.  Xrays/Other Studies: Dg Chest Port 1 View  11/06/2014   CLINICAL DATA:  Midsternal chest pain began last night.  EXAM: PORTABLE CHEST - 1 VIEW  COMPARISON:  None.  FINDINGS: There is no focal parenchymal opacity. There is no pleural effusion or pneumothorax. The heart and mediastinal contours are unremarkable. There is evidence of prior CABG.  The osseous structures are unremarkable.  IMPRESSION: No active disease.   Electronically Signed   By: Kathreen Devoid   On: 11/06/2014 15:40     Assessment/Plan: 1.  NSTEMI with h/o CAD s/p CABG and high risk myoview-  Pt admitted by Cardiology with plans for cardiac cath tomorrow.  I spent some time with Mr. Livermore discussing the risks of CIN and the possibility of requiring dialysis.  He continued to state that he did not want dialysis, however when we talked about the possibility of dying from renal failure he wasn't  quite as adamant and wanted to "think about it awhile".  Agree with the need for heart cath and at this time he appears to accept the risks even without hemodialysis  2. 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  We discussed  both hemo and peritoneal dialysis.  He was initially considering home peritoneal dialysis when he saw Dr. Joelyn Oms yesterday in our office, however he lives alone and was not willing to commit to daily PD.  We discussed that he would likely require to be on intermittent hemodialysis before he would be able to start peritoneal dialysis in the acute setting and is not sure he would agree to committing to dialysis.  He wants to think about it for now.  There is a chance that he won't require RRT after cardiac cath, especially if they are able to limit IV contrast exposure and is hydrated prior to procedure.  Will continue to follow and continue to educate patient on his options.  It sounds as if he is quite functional at home independently, however at his age I am not certain if he would remain independent while also undergoing hemodialysis. 1. Will have him watch educational videos on hemodialysis and peritoneal dialysis. 3. HTN- stable 4. DM- per primary svc 5. Anemia of chronic disease- continue to follow 6. Disposition - as above, may need hospice if he chooses not to pursue cath and/or hemodialysis   Braedon Sjogren A 11/06/2014, 5:30 PM

## 2014-11-06 NOTE — ED Provider Notes (Addendum)
CSN: [id number]     Arrival date & time 11/06/14  1428 History   First MD Initiated Contact with Patient 11/06/14 1431     Chief Complaint  Patient presents with  . Chest Pain     (Consider location/radiation/quality/duration/timing/severity/associated sxs/prior Treatment) HPI Comments: Patient presents to the ER for evaluation of chest pain. Patient reports that he has a history of coronary artery disease. He was admitted to the hospital one month ago with acute coronary syndrome. Since his discharge he has been experiencing intermittent chest pain requiring nitroglycerin for resolution. Last night, however, he had onset of chest pain that did not resolve with nitroglycerin. It was still present this morning. He has taken multiple nitroglycerin. He called EMS this afternoon. EMS administered aspirin and nitroglycerin and patient had prompt resolution of his chest pain. Arrival to the ER, patient is pain-free.  Patient is a 81 y.o. male presenting with chest pain.  Chest Pain   Past Medical History  Diagnosis Date  . CAD (coronary artery disease)   . Hyperlipidemia   . Hypertension   . Myocardial infarction 1998  . Heart murmur   . Type II diabetes mellitus   . History of stomach ulcers   . Arthritis     "touch in my fingers" (09/10/2014)  . Renal insufficiency   . Acute renal failure     [name] 09/10/2014  . B12 deficiency anemia     "stopped taking the shots in ~ 06/2014" (09/10/2014)  . Diabetes 1.5, managed as type 1    Past Surgical History  Procedure Laterality Date  . Esophagogastroduodenoscopy  06/10/2011    Procedure: ESOPHAGOGASTRODUODENOSCOPY (EGD);  Surgeon: Ladene Artist, MD,FACG;  Location: [facility] ENDOSCOPY;  Service: Endoscopy;  Laterality: N/A;  . Coronary artery bypass graft  2003    CABG X5  . Cardiac catheterization  2003;   . Coronary angioplasty  1998    [name] 07/13/2010  . Cataract extraction, bilateral Bilateral    Family History  Problem Relation Age of  Onset  . Diabetes      brothers and sisters  . Hypertension Sister   . Coronary artery disease    . Stroke Mother   . Hypertension Mother   . Cancer Mother   . Heart attack Sister    Social History  Substance Use Topics  . Smoking status: Former Smoker -- 0.50 packs/day for 20 years    Types: Cigarettes  . Smokeless tobacco: Never Used     Comment: "quit smoking cigarettes in the 1970's  . Alcohol Use: No    Review of Systems  Cardiovascular: Positive for chest pain.  All other systems reviewed and are negative.     Allergies  Review of patient's allergies indicates no known allergies.  Home Medications   Prior to Admission medications   Medication Sig Start Date End Date Taking? Authorizing Provider  ACCU-CHEK AVIVA PLUS test strip USE TWICE DAILY AS NEEDED 06/09/14   Marletta Lor, MD  ACCU-CHEK SOFTCLIX LANCETS lancets 1 each by Other route 2 (two) times daily as needed for other. 05/29/13   Marletta Lor, MD  acetaminophen (TYLENOL) 500 MG tablet Take 500 mg by mouth daily as needed for mild pain or headache.    Historical Provider, MD  Alcohol Swabs (B-D SINGLE USE SWABS REGULAR) PADS USE AS DIRECTED 05/29/13   Marletta Lor, MD  amLODipine (NORVASC) 10 MG tablet TAKE 1 TABLET DAILY 03/28/14   Marletta Lor, MD  aspirin EC  81 MG tablet Take 81 mg by mouth daily.    Historical Provider, MD  Blood Glucose Calibration (ACCU-CHEK AVIVA) SOLN USE AS DIRECTED 05/29/13   Marletta Lor, MD  Blood Glucose Monitoring Suppl (ACCU-CHEK AVIVA PLUS) W/DEVICE KIT USE AS DIRECTED 09/24/13   Marletta Lor, MD  cloNIDine (CATAPRES) 0.1 MG tablet TAKE 1 TABLET AT BEDTIME 06/23/14   Marletta Lor, MD  furosemide (LASIX) 40 MG tablet TAKE ONE TABLET BY MOUTH DAILY 03/28/14   Marletta Lor, MD  insulin aspart (NOVOLOG FLEXPEN) 100 UNIT/ML FlexPen INJECT 14 UNITS INTO THE SKIN THREE TIMES DAILY WITH MEALS. 09/12/14   Erlene Quan, PA-C  Insulin Glargine  (LANTUS) 100 UNIT/ML Solostar Pen Inject 20 Units into the skin daily at 10 pm. 09/12/14   Erlene Quan, PA-C  Insulin Pen Needle (RELION PEN NEEDLES) 32G X 4 MM MISC 1 each by Does not apply route 2 (two) times daily as needed. 10/08/13   Marletta Lor, MD  isosorbide mononitrate (IMDUR) 60 MG 24 hr tablet Take 1 tablet (60 mg total) by mouth daily. 09/29/14   Erlene Quan, PA-C  labetalol (NORMODYNE) 200 MG tablet TAKE 1 TABLET (200 MG TOTAL)  BY MOUTH 2 (TWO) TIMES DAILY. 03/28/14   Marletta Lor, MD  latanoprost (XALATAN) 0.005 % ophthalmic solution Place 1 drop into both eyes at bedtime.  08/23/14   Historical Provider, MD  nitroGLYCERIN (NITROSTAT) 0.4 MG SL tablet Place 1 tablet (0.4 mg total) under the tongue every 5 (five) minutes x 3 doses as needed for chest pain. 09/12/14   Erlene Quan, PA-C  pentoxifylline (TRENTAL) 400 MG CR tablet TAKE 1 TABLET (400 MG TOTAL)  BY MOUTH 2 (TWO) TIMES DAILY AT 10 AMAND  5 PM. 03/28/14   Marletta Lor, MD  simvastatin (ZOCOR) 20 MG tablet TAKE 1 TABLET (20 MG TOTAL) BY MOUTH AT BEDTIME. 03/28/14   Marletta Lor, MD  SYRINGE-NEEDLE, DISP, 3 ML 25G X 1" 3 ML MISC 1 each by Does not apply route every 14 (fourteen) days. 06/27/11   Marletta Lor, MD  timolol (TIMOPTIC) 0.25 % ophthalmic solution Place 1 drop into both eyes daily.  08/23/14   Historical Provider, MD   BP 125/63 mmHg  Pulse 73  Temp(Src) 98.8 F (37.1 C) (Oral)  Resp 21  Wt 179 lb 14.3 oz (81.6 kg)  SpO2 99% Physical Exam  Constitutional: He is oriented to person, place, and time. He appears well-developed and well-nourished. No distress.  HENT:  Head: Normocephalic and atraumatic.  Right Ear: Hearing normal.  Left Ear: Hearing normal.  Nose: Nose normal.  Mouth/Throat: Oropharynx is clear and moist and mucous membranes are normal.  Eyes: Conjunctivae and EOM are normal. Pupils are equal, round, and reactive to light.  Neck: Normal range of motion. Neck supple.   Cardiovascular: Regular rhythm, S1 normal and S2 normal.  Exam reveals no gallop and no friction rub.   No murmur heard. Pulmonary/Chest: Effort normal and breath sounds normal. No respiratory distress. He exhibits no tenderness.  Abdominal: Soft. Normal appearance and bowel sounds are normal. There is no hepatosplenomegaly. There is no tenderness. There is no rebound, no guarding, no tenderness at McBurney's point and negative Murphy's sign. No hernia.  Musculoskeletal: Normal range of motion.  Neurological: He is alert and oriented to person, place, and time. He has normal strength. No cranial nerve deficit or sensory deficit. Coordination normal. GCS eye subscore is 4.  GCS verbal subscore is 5. GCS motor subscore is 6.  Skin: Skin is warm, dry and intact. No rash noted. No cyanosis.  Psychiatric: He has a normal mood and affect. His speech is normal and behavior is normal. Thought content normal.  Nursing note and vitals reviewed.   ED Course  Procedures (including critical care time) Labs Review Labs Reviewed  CBC WITH DIFFERENTIAL/PLATELET - Abnormal; Notable for the following:    WBC 16.4 (*)    RBC 3.94 (*)    Hemoglobin 11.7 (*)    HCT 35.1 (*)    Neutrophils Relative % 86 (*)    Neutro Abs 14.0 (*)    Lymphocytes Relative 6 (*)    Monocytes Absolute 1.4 (*)    All other components within normal limits  BASIC METABOLIC PANEL - Abnormal; Notable for the following:    Glucose, Bld 154 (*)    BUN 39 (*)    Creatinine, Ser 3.54 (*)    GFR calc non Af Amer 15 (*)    GFR calc Af Amer 17 (*)    All other components within normal limits  TROPONIN I - Abnormal; Notable for the following:    Troponin I >65.00 (*)    All other components within normal limits    Imaging Review Dg Chest Port 1 View  11/06/2014   CLINICAL DATA:  Midsternal chest pain began last night.  EXAM: PORTABLE CHEST - 1 VIEW  COMPARISON:  None.  FINDINGS: There is no focal parenchymal opacity. There is no  pleural effusion or pneumothorax. The heart and mediastinal contours are unremarkable. There is evidence of prior CABG.  The osseous structures are unremarkable.  IMPRESSION: No active disease.   Electronically Signed   By: Kathreen Devoid   On: 11/06/2014 15:40   I have personally reviewed and evaluated these images and lab results as part of my medical decision-making.   EKG Interpretation   Date/Time:  Thursday November 06 2014 14:30:47 EDT Ventricular Rate:  73 PR Interval:  213 QRS Duration: 116 QT Interval:  405 QTC Calculation: 446 R Axis:   -82 Text Interpretation:  Sinus rhythm Borderline prolonged PR interval  Nonspecific IVCD with LAD LVH with secondary repolarization abnormality  Probable inferior infarct, recent Lateral leads are also involved Baseline  wander in lead(s) II III aVR aVL aVF Confirmed by POLLINA  MD, CHRISTOPHER  [phone]) on 11/06/2014 3:11:19 PM      MDM   Final diagnoses:  None  subacute MI  Patient presents to the emergency department for evaluation of chest pain. Patient does have a history of coronary artery disease. Patient was hospitalized in July with acute coronary syndrome, did not have catheterization because of concerns over renal failure. Patient has been experiencing frequent chest pain requiring nitroglycerin since that time. He had onset of chest pain last night that was unrelenting through the night until this morning. He has, however, had resolution of pain prior to arrival.  EKG at arrival did reveal subtle inferior ST elevations with Q waves. This is suspicious for recent MI, but I do not believe the patient qualifies for STEMI. Patient had pain for more than 12 hours, and is now resolved. Lab work does show significantly elevated troponin. This was discussed with Dr. Aundra Dubin, on-call for cardiology. He does report that the patient could be today if he chooses to have intervention. Cardiology will consult.  I did discuss this with the patient  and his son, [name]. I had an extensive  conversation with the patient's son over the phone. He is driving in from [place]. He understands the condition and does feel that the patient would accept catheterization if recommended by cardiology. Patient will be admitted by cardiology.  Patient initiated on IV nitroglycerin and heparin.  CRITICAL CARE Performed by: Orpah Greek   Total critical care time: 69mn  Critical care time was exclusive of separately billable procedures and treating other patients.  Critical care was necessary to treat or prevent imminent or life-threatening deterioration.  Critical care was time spent personally by me on the following activities: development of treatment plan with patient and/or surrogate as well as nursing, discussions with consultants, evaluation of patient's response to treatment, examination of patient, obtaining history from patient or surrogate, ordering and performing treatments and interventions, ordering and review of laboratory studies, ordering and review of radiographic studies, pulse oximetry and re-evaluation of patient's condition.   COrpah Greek MD 11/06/14 1617  COrpah Greek MD 11/06/14 [phone]

## 2014-11-06 NOTE — ED Notes (Addendum)
Pt comes to Ed from [facility], with reports of chest pain and weakness. The chest pain is every once in a while. Pt took a nitro this morning when he felt pain. Pt reports lack of appetite. Pt has a hx of type1diabetic. Ems took vitals 114/61, gave nitro Bp 99/58. Pt also given 324 mg Asprin. Pt has hx of Cabbage surgery 13 yrs ago.ED dr in room. Pt comfortable, and physical focused assessment started.

## 2014-11-06 NOTE — ED Notes (Signed)
CRITICAL VALUE ALERT  Critical value received:  TROPONIN   Date of notification:  009/08/16  Time of notification:  [id number]  Critical value read back:YES   Nurse who received alert:  Renne Crigler RN   MD notified: MD Betsey Holiday

## 2014-11-06 NOTE — H&P (Signed)
Patient ID: [name] MRN: [id number], DOB/AGE: Jul 28, 1931   Admit date: 11/06/2014   Primary Physician: [name], MD Primary Cardiologist: Dr Tamala Julian  HPI: Pleasant 81 y/o AA male followed by Dr Tamala Julian with a history of CAD, h/o remote MI PCI in the 90's followed by CABG x 5 in 2003. He did well till he was recently admitted with [place] in July. At that time he was noted to have worsening of his CRI from stage 3 to stage 4. Myoview was actually high risk but the pt said he would not go on dialysis and he was discharged home on medical therapy. He did well until yesterday afternoon when he developed SSCP and Lt arm pain.  He says he had "pain all night". He called his son this am who told him to call EMS. In the ED his Troponin is > 65. His EKG shows inferior Q's with a suggestion of ST elevation. He is currently pain free. He again stated he did not want dialysis.    Problem List: Past Medical History  Diagnosis Date  . CAD (coronary artery disease)   . Hyperlipidemia   . Hypertension   . Myocardial infarction 1998  . Heart murmur   . Type II diabetes mellitus   . History of stomach ulcers   . Arthritis     "touch in my fingers" (09/10/2014)  . Renal insufficiency   . Acute renal failure     [name] 09/10/2014  . B12 deficiency anemia     "stopped taking the shots in ~ 06/2014" (09/10/2014)  . Diabetes 1.5, managed as type 1     Past Surgical History  Procedure Laterality Date  . Esophagogastroduodenoscopy  06/10/2011    Procedure: ESOPHAGOGASTRODUODENOSCOPY (EGD);  Surgeon: Ladene Artist, MD,FACG;  Location: [facility] ENDOSCOPY;  Service: Endoscopy;  Laterality: N/A;  . Coronary artery bypass graft  2003    CABG X5  . Cardiac catheterization  2003;   . Coronary angioplasty  1998    [name] 07/13/2010  . Cataract extraction, bilateral Bilateral      Allergies: No Known Allergies   Home Medications Prior to Admission medications   Medication Sig Start Date  End Date Taking? Authorizing Provider  ACCU-CHEK AVIVA PLUS test strip USE TWICE DAILY AS NEEDED 06/09/14   Marletta Lor, MD  ACCU-CHEK SOFTCLIX LANCETS lancets 1 each by Other route 2 (two) times daily as needed for other. 05/29/13   Marletta Lor, MD  acetaminophen (TYLENOL) 500 MG tablet Take 500 mg by mouth daily as needed for mild pain or headache.    Historical Provider, MD  Alcohol Swabs (B-D SINGLE USE SWABS REGULAR) PADS USE AS DIRECTED 05/29/13   Marletta Lor, MD  amLODipine (NORVASC) 10 MG tablet TAKE 1 TABLET DAILY 03/28/14   Marletta Lor, MD  aspirin EC 81 MG tablet Take 81 mg by mouth daily.    Historical Provider, MD  Blood Glucose Calibration (ACCU-CHEK AVIVA) SOLN USE AS DIRECTED 05/29/13   Marletta Lor, MD  Blood Glucose Monitoring Suppl (ACCU-CHEK AVIVA PLUS) W/DEVICE KIT USE AS DIRECTED 09/24/13   Marletta Lor, MD  cloNIDine (CATAPRES) 0.1 MG tablet TAKE 1 TABLET AT BEDTIME 06/23/14   Marletta Lor, MD  furosemide (LASIX) 40 MG tablet TAKE ONE TABLET BY MOUTH DAILY 03/28/14   Marletta Lor, MD  insulin aspart (NOVOLOG FLEXPEN) 100 UNIT/ML FlexPen INJECT 14 UNITS INTO THE SKIN THREE TIMES DAILY WITH MEALS. 09/12/14  Doreene Burke Kilroy, PA-C  Insulin Glargine (LANTUS) 100 UNIT/ML Solostar Pen Inject 20 Units into the skin daily at 10 pm. 09/12/14   Erlene Quan, PA-C  Insulin Pen Needle (RELION PEN NEEDLES) 32G X 4 MM MISC 1 each by Does not apply route 2 (two) times daily as needed. 10/08/13   Marletta Lor, MD  isosorbide mononitrate (IMDUR) 60 MG 24 hr tablet Take 1 tablet (60 mg total) by mouth daily. 09/29/14   Erlene Quan, PA-C  labetalol (NORMODYNE) 200 MG tablet TAKE 1 TABLET (200 MG TOTAL)  BY MOUTH 2 (TWO) TIMES DAILY. 03/28/14   Marletta Lor, MD  latanoprost (XALATAN) 0.005 % ophthalmic solution Place 1 drop into both eyes at bedtime.  08/23/14   Historical Provider, MD  nitroGLYCERIN (NITROSTAT) 0.4 MG SL tablet Place 1  tablet (0.4 mg total) under the tongue every 5 (five) minutes x 3 doses as needed for chest pain. 09/12/14   Erlene Quan, PA-C  pentoxifylline (TRENTAL) 400 MG CR tablet TAKE 1 TABLET (400 MG TOTAL)  BY MOUTH 2 (TWO) TIMES DAILY AT 10 AMAND  5 PM. 03/28/14   Marletta Lor, MD  simvastatin (ZOCOR) 20 MG tablet TAKE 1 TABLET (20 MG TOTAL) BY MOUTH AT BEDTIME. 03/28/14   Marletta Lor, MD  SYRINGE-NEEDLE, DISP, 3 ML 25G X 1" 3 ML MISC 1 each by Does not apply route every 14 (fourteen) days. 06/27/11   Marletta Lor, MD  timolol (TIMOPTIC) 0.25 % ophthalmic solution Place 1 drop into both eyes daily.  08/23/14   Historical Provider, MD     Family History  Problem Relation Age of Onset  . Diabetes      brothers and sisters  . Hypertension Sister   . Coronary artery disease    . Stroke Mother   . Hypertension Mother   . Cancer Mother   . Heart attack Sister      Social History   Social History  . Marital Status: Widowed    Spouse Name: N/A  . Number of Children: N/A  . Years of Education: N/A   Occupational History  . Not on file.   Social History Main Topics  . Smoking status: Former Smoker -- 0.50 packs/day for 20 years    Types: Cigarettes  . Smokeless tobacco: Never Used     Comment: "quit smoking cigarettes in the 1970's  . Alcohol Use: No  . Drug Use: No  . Sexual Activity: Not Currently   Other Topics Concern  . Not on file   Social History Narrative     Review of Systems: General: negative for chills, fever, night sweats or weight changes.  Cardiovascular: negative for chest pain, dyspnea on exertion, edema, orthopnea, palpitations, paroxysmal nocturnal dyspnea or shortness of breath HEENT: negative for any visual disturbances, blindness, glaucoma Dermatological: negative for rash Respiratory: negative for cough, hemoptysis, or wheezing Urologic: negative for hematuria or dysuria Abdominal: negative for nausea, vomiting, diarrhea, bright red  blood per rectum, melena, or hematemesis Neurologic: negative for visual changes, syncope, or dizziness Musculoskeletal: negative for back pain, joint pain, or swelling Psych: cooperative and appropriate All other systems reviewed and are otherwise negative except as noted above.  Physical Exam: Blood pressure 125/63, pulse 73, temperature 98.8 F (37.1 C), temperature source Oral, resp. rate 21, weight 179 lb 14.3 oz (81.6 kg), SpO2 99 %.  General appearance: alert, cooperative and no distress Neck: no carotid bruit and no JVD Lungs: decreased  breath sounds Lt base Heart: regular rate and rhythm Abdomen: soft, non-tender; bowel sounds normal; no masses,  no organomegaly Extremities: bilateral femoral bruits Pulses: diminnished Skin: Skin color, texture, turgor normal. No rashes or lesions Neurologic: Grossly normal    Labs:   Results for orders placed or performed during the hospital encounter of 11/06/14 (from the past 24 hour(s))  CBC with Differential/Platelet     Status: Abnormal   Collection Time: 11/06/14  2:58 PM  Result Value Ref Range   WBC 16.4 (H) 4.0 - 10.5 K/uL   RBC 3.94 (L) 4.22 - 5.81 MIL/uL   Hemoglobin 11.7 (L) 13.0 - 17.0 g/dL   HCT 35.1 (L) 39.0 - 52.0 %   MCV 89.1 78.0 - 100.0 fL   MCH 29.7 26.0 - 34.0 pg   MCHC 33.3 30.0 - 36.0 g/dL   RDW 14.0 11.5 - 15.5 %   Platelets 230 150 - 400 K/uL   Neutrophils Relative % 86 (H) 43 - 77 %   Neutro Abs 14.0 (H) 1.7 - 7.7 K/uL   Lymphocytes Relative 6 (L) 12 - 46 %   Lymphs Abs 1.0 0.7 - 4.0 K/uL   Monocytes Relative 8 3 - 12 %   Monocytes Absolute 1.4 (H) 0.1 - 1.0 K/uL   Eosinophils Relative 0 0 - 5 %   Eosinophils Absolute 0.0 0.0 - 0.7 K/uL   Basophils Relative 0 0 - 1 %   Basophils Absolute 0.0 0.0 - 0.1 K/uL  Basic metabolic panel     Status: Abnormal   Collection Time: 11/06/14  2:58 PM  Result Value Ref Range   Sodium 137 135 - 145 mmol/L   Potassium 5.0 3.5 - 5.1 mmol/L   Chloride 101 101 - 111  mmol/L   CO2 25 22 - 32 mmol/L   Glucose, Bld 154 (H) 65 - 99 mg/dL   BUN 39 (H) 6 - 20 mg/dL   Creatinine, Ser 3.54 (H) 0.61 - 1.24 mg/dL   Calcium 10.1 8.9 - 10.3 mg/dL   GFR calc non Af Amer 15 (L) >60 mL/min   GFR calc Af Amer 17 (L) >60 mL/min   Anion gap 11 5 - 15  Troponin I     Status: Abnormal   Collection Time: 11/06/14  2:58 PM  Result Value Ref Range   Troponin I >65.00 (HH) <0.031 ng/mL     Radiology/Studies: Dg Chest Port 1 View  11/06/2014   CLINICAL DATA:  Midsternal chest pain began last night.  EXAM: PORTABLE CHEST - 1 VIEW  COMPARISON:  None.  FINDINGS: There is no focal parenchymal opacity. There is no pleural effusion or pneumothorax. The heart and mediastinal contours are unremarkable. There is evidence of prior CABG.  The osseous structures are unremarkable.  IMPRESSION: No active disease.   Electronically Signed   By: Kathreen Devoid   On: 11/06/2014 15:40    EKG:NSR, inferior ST elevation, TWI AVL, ST depression V2 and V3  ASSESSMENT AND PLAN:  Principal Problem:   Myocardial infarct-out of hospital Active Problems:   Coronary atherosclerosis, CABG in 2003   Chronic renal disease, stage IV-pt declines dialysis   Diabetes mellitus with renal complications   Essential hypertension   Dyslipidemia   Peptic ulcer disease- 2013   PLAN: Pt again indicates he does not want dialysis. He is currently pain free on Heparin and IV NTG. Admit, continue Heparin and NTG 48 hrs. Watch for hypotension with inferior MI, consider gentle hydration, hold Lasix.  Check echo.    SignedErlene Quan, PA-C 11/06/2014, 4:19 PM [phone]  As above, patient seen and examined. Briefly he is an 81 year old male with past medical history of coronary artery disease status post coronary artery bypass and graft, stage IV chronic kidney disease, diabetes mellitus, hypertension, hyperlipidemia admitted with out of hospital myocardial infarction. Patient recently admitted with chest pain.  Nuclear study showed anterolateral ischemia and felt to be high risk. However Dr Acie Fredrickson discussed cardiac catheterization with patient including the risks of dialysis. The patient refused to consider dialysis and therefore catheterization was not performed. He was treated medically. He developed chest pain at 7 PM last evening. The pain persisted throughout the night and this morning. He presented to the emergency room family history is markedly elevated. He is now pain-free. Electrocardiogram shows sinus rhythm, slight inferior lateral ST elevation with anterior lateral ST depression. Troponin is greater than 65. BUN 39 creatinine 3.54.   Out of hospital MI-Symptoms began approximately 22 hours ago and he is now pain-free. Plan to treat with aspirin, heparin, statin, nitroglycerin and continue beta blocker. Long discussion today concerning cardiac catheterization. I explained the risks and benefits and we discussed the possibility of need for dialysis. He states he has discussed this with his nephrologist who is Dr. Joelyn Oms. He may be able to have dialysis at home. He therefore is agreeable to proceed with cardiac catheterization understanding the risks to his kidneys and would agree to dialysis in the future if necessary. We will ask nephrology to consult. Plan to proceed tomorrow. No ventriculogram and limit dye. Hydrate prior to procedure. Check echocardiogram for LV function. Kirk Ruths

## 2014-11-07 ENCOUNTER — Inpatient Hospital Stay (HOSPITAL_COMMUNITY): Payer: Commercial Managed Care - HMO

## 2014-11-07 ENCOUNTER — Encounter (HOSPITAL_COMMUNITY): Payer: Self-pay

## 2014-11-07 ENCOUNTER — Encounter (HOSPITAL_COMMUNITY)
Admission: EM | Disposition: A | Discharge status: Home / Self Care | Payer: Commercial Managed Care - HMO | Source: Home / Self Care | Attending: Interventional Cardiology

## 2014-11-07 DIAGNOSIS — R079 Chest pain, unspecified: Secondary | ICD-10-CM

## 2014-11-07 DIAGNOSIS — E785 Hyperlipidemia, unspecified: Secondary | ICD-10-CM

## 2014-11-07 DIAGNOSIS — I25810 Atherosclerosis of coronary artery bypass graft(s) without angina pectoris: Secondary | ICD-10-CM

## 2014-11-07 DIAGNOSIS — I214 Non-ST elevation (NSTEMI) myocardial infarction: Secondary | ICD-10-CM | POA: Insufficient documentation

## 2014-11-07 DIAGNOSIS — N184 Chronic kidney disease, stage 4 (severe): Secondary | ICD-10-CM

## 2014-11-07 HISTORY — PX: CARDIAC CATHETERIZATION: SHX172

## 2014-11-07 LAB — BASIC METABOLIC PANEL
Anion gap: 11 (ref 5–15)
BUN: 44 mg/dL — ABNORMAL HIGH (ref 6–20)
CO2: 21 mmol/L — ABNORMAL LOW (ref 22–32)
Calcium: 9.1 mg/dL (ref 8.9–10.3)
Chloride: 102 mmol/L (ref 101–111)
Creatinine, Ser: 3.28 mg/dL — ABNORMAL HIGH (ref 0.61–1.24)
GFR calc Af Amer: 19 mL/min — ABNORMAL LOW (ref >60)
GFR calc non Af Amer: 16 mL/min — ABNORMAL LOW (ref >60)
Glucose, Bld: 156 mg/dL — ABNORMAL HIGH (ref 65–99)
Potassium: 4.2 mmol/L (ref 3.5–5.1)
Sodium: 134 mmol/L — ABNORMAL LOW (ref 135–145)

## 2014-11-07 LAB — LIPID PANEL
Cholesterol: 118 mg/dL (ref 0–200)
HDL: 37 mg/dL — ABNORMAL LOW (ref >40)
LDL Cholesterol: 55 mg/dL (ref 0–99)
Total CHOL/HDL Ratio: 3.2 RATIO
Triglycerides: 129 mg/dL (ref <150)
VLDL: 26 mg/dL (ref 0–40)

## 2014-11-07 LAB — GLUCOSE, CAPILLARY
GLUCOSE-CAPILLARY: 142 mg/dL — AB (ref 65–99)
GLUCOSE-CAPILLARY: 223 mg/dL — AB (ref 65–99)
Glucose-Capillary: 154 mg/dL — ABNORMAL HIGH (ref 65–99)
Glucose-Capillary: 124 mg/dL — ABNORMAL HIGH (ref 65–99)

## 2014-11-07 LAB — HEPARIN LEVEL (UNFRACTIONATED)
Heparin Unfractionated: 0.19 IU/mL — ABNORMAL LOW (ref 0.30–0.70)
Heparin Unfractionated: 0.33 IU/mL (ref 0.30–0.70)

## 2014-11-07 LAB — CBC
HCT: 36.3 % — ABNORMAL LOW (ref 39.0–52.0)
HEMOGLOBIN: 12.3 g/dL — AB (ref 13.0–17.0)
MCH: 29.9 pg (ref 26.0–34.0)
MCHC: 33.9 g/dL (ref 30.0–36.0)
MCV: 88.3 fL (ref 78.0–100.0)
Platelets: 201 10*3/uL (ref 150–400)
RBC: 4.11 MIL/uL — ABNORMAL LOW (ref 4.22–5.81)
RDW: 14.1 % (ref 11.5–15.5)
WBC: 15.6 10*3/uL — ABNORMAL HIGH (ref 4.0–10.5)

## 2014-11-07 LAB — PROTIME-INR
INR: 1.46 (ref 0.00–1.49)
Prothrombin Time: 17.8 seconds — ABNORMAL HIGH (ref 11.6–15.2)

## 2014-11-07 LAB — TROPONIN I: Troponin I: >65 ng/mL (ref <0.031)

## 2014-11-07 SURGERY — LEFT HEART CATH AND CORONARY ANGIOGRAPHY
Anesthesia: LOCAL

## 2014-11-07 MED ORDER — SODIUM CHLORIDE 0.9 % IV SOLN: 250.0000 mL | INTRAVENOUS | Status: DC | PRN

## 2014-11-07 MED ORDER — NITROGLYCERIN 1 MG/10 ML FOR IR/CATH LAB
INTRA_ARTERIAL | Status: AC
  Filled 2014-11-07: qty 10

## 2014-11-07 MED ORDER — SODIUM CHLORIDE 0.9 % IJ SOLN: 3.0000 mL | INTRAMUSCULAR | Status: DC | PRN

## 2014-11-07 MED ORDER — HEPARIN (PORCINE) IN NACL 2-0.9 UNIT/ML-% IJ SOLN
INTRAMUSCULAR | Status: AC
  Filled 2014-11-07: qty 1000

## 2014-11-07 MED ORDER — FUROSEMIDE 10 MG/ML IJ SOLN
INTRAMUSCULAR | Status: DC | PRN
  Administered 2014-11-07: 80 mg via INTRAVENOUS

## 2014-11-07 MED ORDER — LIDOCAINE HCL (PF) 1 % IJ SOLN
INTRAMUSCULAR | Status: AC
  Filled 2014-11-07: qty 30

## 2014-11-07 MED ORDER — IOHEXOL 350 MG/ML SOLN
INTRAVENOUS | Status: DC | PRN
  Administered 2014-11-07: 55 mL via INTRA_ARTERIAL

## 2014-11-07 MED ORDER — HEPARIN BOLUS VIA INFUSION
2000.0000 [IU] | Freq: Once | INTRAVENOUS | Status: AC
  Administered 2014-11-07: 2000 [IU] via INTRAVENOUS
  Filled 2014-11-07: qty 2000

## 2014-11-07 MED ORDER — SODIUM CHLORIDE 0.9 % WEIGHT BASED INFUSION: 1.0000 mL/kg/h | INTRAVENOUS | Status: AC

## 2014-11-07 MED ORDER — HEPARIN (PORCINE) IN NACL 100-0.45 UNIT/ML-% IJ SOLN
1250.0000 [IU]/h | INTRAMUSCULAR | Status: DC
  Filled 2014-11-07: qty 250

## 2014-11-07 MED ORDER — HEPARIN (PORCINE) IN NACL 100-0.45 UNIT/ML-% IJ SOLN
1250.0000 [IU]/h | INTRAMUSCULAR | Status: DC
  Administered 2014-11-08: 1250 [IU]/h via INTRAVENOUS
  Filled 2014-11-07: qty 250

## 2014-11-07 MED ORDER — FUROSEMIDE 10 MG/ML IJ SOLN
INTRAMUSCULAR | Status: AC
  Filled 2014-11-07: qty 8

## 2014-11-07 MED ORDER — LIDOCAINE HCL (PF) 1 % IJ SOLN
INTRAMUSCULAR | Status: DC | PRN
  Administered 2014-11-07: 17:00:00

## 2014-11-07 MED ORDER — SODIUM CHLORIDE 0.9 % IJ SOLN
3.0000 mL | Freq: Two times a day (BID) | INTRAMUSCULAR | Status: DC
  Administered 2014-11-07 – 2014-11-11 (×5): 3 mL via INTRAVENOUS

## 2014-11-07 SURGICAL SUPPLY — 9 items
CATH INFINITI 5 FR IM (CATHETERS) ×2 IMPLANT
CATH INFINITI 5FR JL4 (CATHETERS) ×2 IMPLANT
CATH SITESEER 5F MULTI A 2 (CATHETERS) ×2 IMPLANT
DEVICE WIRE ANGIOSEAL 6FR (Vascular Products) ×2 IMPLANT
KIT HEART LEFT (KITS) ×2 IMPLANT
PACK CARDIAC CATHETERIZATION (CUSTOM PROCEDURE TRAY) ×2 IMPLANT
SHEATH PINNACLE 5F 10CM (SHEATH) ×2 IMPLANT
TRANSDUCER W/STOPCOCK (MISCELLANEOUS) ×2 IMPLANT
WIRE EMERALD 3MM-J .035X150CM (WIRE) ×2 IMPLANT

## 2014-11-07 NOTE — Progress Notes (Signed)
Echocardiogram 2D Echocardiogram has been performed.  [name] 11/07/2014, 10:53 AM

## 2014-11-07 NOTE — Progress Notes (Signed)
ANTICOAGULATION CONSULT NOTE - Follow Up Consult  Pharmacy Consult for heparin Indication: ACS.   Restart heparin 8 hrs post sheath removal  Labs:  Recent Labs  11/06/14 1458 11/06/14 2354 11/07/14 0358 11/07/14 0940  HGB 11.7*  --  12.3*  --   HCT 35.1*  --  36.3*  --   PLT 230  --  201  --   LABPROT  --   --  17.8*  --   INR  --   --  1.46  --   HEPARINUNFRC  --  0.19*  --  0.33  CREATININE 3.54*  --  3.28*  --   TROPONINI >65.00*  --  >65.00*  --      Assessment: 81yo male admitted with ACS now s/p cardiac cath.  To restart heparin 8 hrs post sheath removal. Sheath removed @16 :32 , no bleeding or hematoma.  Prior to cath, heparin drip rate was 1250 units/hr with heparin level therapeutic at 0.33, CBC stable.   Goal of Therapy:  Heparin level 0.3-0.7 units/ml Monitor platelets by anticoagulation protocol: Yes   Plan:  Restart IV heparin tonight at 00:30 at infusion  rate 1250 units/hr. Daily heparin level and CBC  Nicole Cella, RPh Clinical Pharmacist Pager: [phone] 11/07/2014 5:12 PM

## 2014-11-07 NOTE — Progress Notes (Signed)
DAILY PROGRESS NOTE  Subjective:  No events overnight. Troponin is markedly elevated >65. He denies chest pain currently on heparin and nitroglycerin. Plan for [facility] today with Dr. Tamala Julian.  Objective:  Temp:  [98.2 F (36.8 C)-98.8 F (37.1 C)] 98.2 F (36.8 C) (09/09 0744) Pulse Rate:  [73-86] 86 (09/09 0200) Resp:  [15-26] 21 (09/09 0200) BP: (114-159)/(57-121) 129/68 mmHg (09/09 0200) SpO2:  [91 %-100 %] 91 % (09/09 0200) Weight:  [172 lb 13.5 oz (78.4 kg)-179 lb 14.3 oz (81.6 kg)] 172 lb 13.5 oz (78.4 kg) (09/09 0500) Weight change:   Intake/Output from previous day: 09/08 0701 - 09/09 0700 In: 636.1 [P.O.:240; I.V.:396.1] Out: 300 [Urine:300]  Intake/Output from this shift: Total I/O In: -  Out: 250 [Urine:250]  Medications: Current Facility-Administered Medications  Medication Dose Route Frequency Provider Last Rate Last Dose  . 0.9 %  sodium chloride infusion  250 mL Intravenous PRN Erlene Quan, PA-C      . 0.9% sodium chloride infusion  1 mL/kg/hr Intravenous Continuous Belva Crome, MD 81.6 mL/hr at 11/06/14 2030 1 mL/kg/hr at 11/06/14 2030  . acetaminophen (TYLENOL) tablet 650 mg  650 mg Oral Q4H PRN Erlene Quan, PA-C      . ALPRAZolam Duanne Moron) tablet 0.25 mg  0.25 mg Oral BID PRN Doreene Burke Kilroy, PA-C      . amLODipine (NORVASC) tablet 10 mg  10 mg Oral Daily Luke K Kilroy, PA-C      . aspirin chewable tablet 324 mg  324 mg Oral NOW Erlene Quan, PA-C       Or  . aspirin suppository 300 mg  300 mg Rectal NOW Doreene Burke Kilroy, PA-C      . aspirin EC tablet 81 mg  81 mg Oral Daily Luke K Kilroy, PA-C      . heparin ADULT infusion 100 units/mL (25000 units/250 mL)  1,250 Units/hr Intravenous Continuous Laren Everts, RPH 12.5 mL/hr at 11/07/14 0115 1,250 Units/hr at 11/07/14 0115  . Influenza vac split quadrivalent PF (FLUARIX) injection 0.5 mL  0.5 mL Intramuscular Tomorrow-1000 Belva Crome, MD      . insulin aspart (novoLOG) injection 0-15 Units  0-15 Units  Subcutaneous TID WC Luke K Kilroy, PA-C      . insulin aspart (novoLOG) injection 0-5 Units  0-5 Units Subcutaneous QHS Erlene Quan, PA-C   0 Units at 11/06/14 2200  . labetalol (NORMODYNE) tablet 200 mg  200 mg Oral BID Doreene Burke Kilroy, PA-C   200 mg at 11/06/14 2300  . latanoprost (XALATAN) 0.005 % ophthalmic solution 1 drop  1 drop Both Eyes QHS Erlene Quan, PA-C   1 drop at 11/06/14 2300  . nitroGLYCERIN (NITROSTAT) SL tablet 0.4 mg  0.4 mg Sublingual Q5 min PRN Orpah Greek, MD      . nitroGLYCERIN 50 mg in dextrose 5 % 250 mL (0.2 mg/mL) infusion  3-30 mcg/min Intravenous Titrated Luke K Kilroy, PA-C      . ondansetron [facility]) injection 4 mg  4 mg Intravenous Q6H PRN Erlene Quan, PA-C      . simvastatin (ZOCOR) tablet 20 mg  20 mg Oral QHS Doreene Burke Kilroy, PA-C   20 mg at 11/06/14 2300  . sodium chloride 0.9 % injection 3 mL  3 mL Intravenous Q12H Doreene Burke Kilroy, PA-C   3 mL at 11/06/14 2300  . sodium chloride 0.9 % injection 3 mL  3 mL Intravenous PRN  Doreene Burke Kilroy, PA-C      . timolol (TIMOPTIC) 0.25 % ophthalmic solution 1 drop  1 drop Both Eyes Daily Luke K Kilroy, PA-C      . zolpidem (AMBIEN) tablet 5 mg  5 mg Oral QHS PRN Erlene Quan, PA-C   5 mg at 11/06/14 2302    Physical Exam: General appearance: alert and no distress Lungs: clear to auscultation bilaterally Heart: regular rate and rhythm, S1, S2 normal, no murmur, click, rub or gallop Extremities: extremities normal, atraumatic, no cyanosis or edema Pulses: 2+ and symmetric  Lab Results: Results for orders placed or performed during the hospital encounter of 11/06/14 (from the past 48 hour(s))  CBC with Differential/Platelet     Status: Abnormal   Collection Time: 11/06/14  2:58 PM  Result Value Ref Range   WBC 16.4 (H) 4.0 - 10.5 K/uL   RBC 3.94 (L) 4.22 - 5.81 MIL/uL   Hemoglobin 11.7 (L) 13.0 - 17.0 g/dL   HCT 35.1 (L) 39.0 - 52.0 %   MCV 89.1 78.0 - 100.0 fL   MCH 29.7 26.0 - 34.0 pg   MCHC 33.3 30.0  - 36.0 g/dL   RDW 14.0 11.5 - 15.5 %   Platelets 230 150 - 400 K/uL   Neutrophils Relative % 86 (H) 43 - 77 %   Neutro Abs 14.0 (H) 1.7 - 7.7 K/uL   Lymphocytes Relative 6 (L) 12 - 46 %   Lymphs Abs 1.0 0.7 - 4.0 K/uL   Monocytes Relative 8 3 - 12 %   Monocytes Absolute 1.4 (H) 0.1 - 1.0 K/uL   Eosinophils Relative 0 0 - 5 %   Eosinophils Absolute 0.0 0.0 - 0.7 K/uL   Basophils Relative 0 0 - 1 %   Basophils Absolute 0.0 0.0 - 0.1 K/uL  Basic metabolic panel     Status: Abnormal   Collection Time: 11/06/14  2:58 PM  Result Value Ref Range   Sodium 137 135 - 145 mmol/L   Potassium 5.0 3.5 - 5.1 mmol/L   Chloride 101 101 - 111 mmol/L   CO2 25 22 - 32 mmol/L   Glucose, Bld 154 (H) 65 - 99 mg/dL   BUN 39 (H) 6 - 20 mg/dL   Creatinine, Ser 3.54 (H) 0.61 - 1.24 mg/dL   Calcium 10.1 8.9 - 10.3 mg/dL   GFR calc non Af Amer 15 (L) >60 mL/min   GFR calc Af Amer 17 (L) >60 mL/min    Comment: (NOTE) The eGFR has been calculated using the CKD EPI equation. This calculation has not been validated in all clinical situations. eGFR's persistently <60 mL/min signify possible Chronic Kidney Disease.    Anion gap 11 5 - 15  Troponin I     Status: Abnormal   Collection Time: 11/06/14  2:58 PM  Result Value Ref Range   Troponin I >65.00 (HH) <0.031 ng/mL    Comment:        POSSIBLE MYOCARDIAL ISCHEMIA. SERIAL TESTING RECOMMENDED. CRITICAL RESULT CALLED TO, READ BACK BY AND VERIFIED WITH: Chesley Noon 1535 11/06/14 D BRADLEY   MRSA PCR Screening     Status: None   Collection Time: 11/06/14  6:39 PM  Result Value Ref Range   MRSA by PCR NEGATIVE NEGATIVE    Comment:        The GeneXpert MRSA Assay (FDA approved for NASAL specimens only), is one component of a comprehensive MRSA colonization surveillance program. It is not intended to diagnose  MRSA infection nor to guide or monitor treatment for MRSA infections.   Glucose, capillary     Status: Abnormal   Collection Time: 11/06/14  11:05 PM  Result Value Ref Range   Glucose-Capillary 159 (H) 65 - 99 mg/dL   Comment 1 Capillary Specimen   Heparin level (unfractionated)     Status: Abnormal   Collection Time: 11/06/14 11:54 PM  Result Value Ref Range   Heparin Unfractionated 0.19 (L) 0.30 - 0.70 IU/mL    Comment:        IF HEPARIN RESULTS ARE BELOW EXPECTED VALUES, AND PATIENT DOSAGE HAS BEEN CONFIRMED, SUGGEST FOLLOW UP TESTING OF ANTITHROMBIN III LEVELS.   Troponin I     Status: Abnormal   Collection Time: 11/07/14  3:58 AM  Result Value Ref Range   Troponin I >65.00 (HH) <0.031 ng/mL    Comment: CONSISTENT WITH PREVIOUS RESULT        POSSIBLE MYOCARDIAL ISCHEMIA. SERIAL TESTING RECOMMENDED.   Basic metabolic panel     Status: Abnormal   Collection Time: 11/07/14  3:58 AM  Result Value Ref Range   Sodium 134 (L) 135 - 145 mmol/L   Potassium 4.2 3.5 - 5.1 mmol/L   Chloride 102 101 - 111 mmol/L   CO2 21 (L) 22 - 32 mmol/L   Glucose, Bld 156 (H) 65 - 99 mg/dL   BUN 44 (H) 6 - 20 mg/dL   Creatinine, Ser 3.28 (H) 0.61 - 1.24 mg/dL   Calcium 9.1 8.9 - 10.3 mg/dL   GFR calc non Af Amer 16 (L) >60 mL/min   GFR calc Af Amer 19 (L) >60 mL/min    Comment: (NOTE) The eGFR has been calculated using the CKD EPI equation. This calculation has not been validated in all clinical situations. eGFR's persistently <60 mL/min signify possible Chronic Kidney Disease.    Anion gap 11 5 - 15  Lipid panel     Status: Abnormal   Collection Time: 11/07/14  3:58 AM  Result Value Ref Range   Cholesterol 118 0 - 200 mg/dL   Triglycerides 129 <150 mg/dL   HDL 37 (L) >40 mg/dL   Total CHOL/HDL Ratio 3.2 RATIO   VLDL 26 0 - 40 mg/dL   LDL Cholesterol 55 0 - 99 mg/dL    Comment:        Total Cholesterol/HDL:CHD Risk Coronary Heart Disease Risk Table                     Men   Women  1/2 Average Risk   3.4   3.3  Average Risk       5.0   4.4  2 X Average Risk   9.6   7.1  3 X Average Risk  23.4   11.0        Use the  calculated Patient Ratio above and the CHD Risk Table to determine the patient's CHD Risk.        ATP III CLASSIFICATION (LDL):  <100     mg/dL   Optimal  100-129  mg/dL   Near or Above                    Optimal  130-159  mg/dL   Borderline  160-189  mg/dL   High  >190     mg/dL   Very High   CBC     Status: Abnormal   Collection Time: 11/07/14  3:58 AM  Result Value Ref  Range   WBC 15.6 (H) 4.0 - 10.5 K/uL   RBC 4.11 (L) 4.22 - 5.81 MIL/uL   Hemoglobin 12.3 (L) 13.0 - 17.0 g/dL   HCT 36.3 (L) 39.0 - 52.0 %   MCV 88.3 78.0 - 100.0 fL   MCH 29.9 26.0 - 34.0 pg   MCHC 33.9 30.0 - 36.0 g/dL   RDW 14.1 11.5 - 15.5 %   Platelets 201 150 - 400 K/uL  Protime-INR     Status: Abnormal   Collection Time: 11/07/14  3:58 AM  Result Value Ref Range   Prothrombin Time 17.8 (H) 11.6 - 15.2 seconds   INR 1.46 0.00 - 1.49  Glucose, capillary     Status: Abnormal   Collection Time: 11/07/14  7:43 AM  Result Value Ref Range   Glucose-Capillary 154 (H) 65 - 99 mg/dL    Imaging: Dg Chest Port 1 View  11/06/2014   CLINICAL DATA:  Midsternal chest pain began last night.  EXAM: PORTABLE CHEST - 1 VIEW  COMPARISON:  None.  FINDINGS: There is no focal parenchymal opacity. There is no pleural effusion or pneumothorax. The heart and mediastinal contours are unremarkable. There is evidence of prior CABG.  The osseous structures are unremarkable.  IMPRESSION: No active disease.   Electronically Signed   By: Kathreen Devoid   On: 11/06/2014 15:40    Assessment:  1. Principal Problem: 2.   Myocardial infarct-out of hospital 3. Active Problems: 4.   Diabetes mellitus with renal complications 5.   Dyslipidemia 6.   Essential hypertension 7.   Coronary atherosclerosis, CABG in 2003 8.   Peptic ulcer disease- 2013 9.   Chronic renal disease, stage IV-pt declines dialysis 10.   Elevated troponin 11.   Plan:  1. Plan for [facility] today with Dr. Tamala Julian for NSTEMI  (or more likely missed STEMI >24 hrs, based  on EKG changes inferiorly and reciprocal anterior ST depression). He understands the significant risk of CIN and given his severely reduced GFR, there is a greater likelihood of him progressing to need dialysis, which he accepts.   Time Spent Directly with Patient:  15 minutes  Length of Stay:  LOS: 1 day   Pixie Casino, MD, [facility] Attending Cardiologist [place] 11/07/2014, 9:49 AM

## 2014-11-07 NOTE — Progress Notes (Signed)
ANTICOAGULATION CONSULT NOTE - Follow Up Consult  Pharmacy Consult for heparin Indication: ACS  Labs:  Recent Labs  11/06/14 1458 11/06/14 2354  HGB 11.7*  --   HCT 35.1*  --   PLT 230  --   HEPARINUNFRC  --  0.19*  CREATININE 3.54*  --   TROPONINI >65.00*  --      Assessment: 81yo male subtherapeutic on heparin with initial dosing for MI.  Goal of Therapy:  Heparin level 0.3-0.7 units/ml Monitor platelets by anticoagulation protocol: Yes   Plan:  Will rebolus with heparin 2000 units and increase gtt by 3 units/kg/hr to 1250 units/hr and check level in [place], PharmD, BCPS  11/07/2014,1:11 AM

## 2014-11-07 NOTE — Progress Notes (Signed)
Subjective:  No CP overnight and seems like is making urine- creatinine stable.  Pt it seems now willing to pursue HD if he "has to" Objective Vital signs in last 24 hours: Filed Vitals:   11/07/14 0100 11/07/14 0200 11/07/14 0500 11/07/14 0744  BP: 146/61 129/68    Pulse: 81 86    Temp:    98.2 F (36.8 C)  TempSrc:    Oral  Resp: 17 21    Weight:   78.4 kg (172 lb 13.5 oz)   SpO2: 94% 91%     Weight change:   Intake/Output Summary (Last 24 hours) at 11/07/14 0941 Last data filed at 11/07/14 [id number]  Gross per 24 hour  Intake  636.1 ml  Output    550 ml  Net   86.1 ml    Assessment/ Plan: Pt is Holt 81 y.o. yo male with advanced CKD who was admitted on 11/06/2014 with NSTEMI in need of cardiac cath   Assessment/Plan: 1. Renal- advanced CKD at baseline.  Now softening on the stance of  not considering dialysis therapy.  Has agreed to undergo cardiac cath today understanding the possible risks to renal function.  Will take it day by day and will likely consider dialysis initiation should it become needed.  Mostly interested in home PD but understands this would not be the initial modality if he requires dialysis this admit 2. CV- NSTEMI with high risk myoview- for cath today- currently CP free- appropriate precautions in place pre and post cath 3. Anemia- not an issue at this time 4. HTN/vol- well controlled on amlodipine and labetalol   [name]    Labs: Basic Metabolic Panel:  Recent Labs Lab 11/06/14 1458 11/07/14 0358  NA 137 134*  K 5.0 4.2  CL 101 102  CO2 25 21*  GLUCOSE 154* 156*  BUN 39* 44*  CREATININE 3.54* 3.28*  CALCIUM 10.1 9.1   Liver Function Tests: No results for input(s): AST, ALT, ALKPHOS, BILITOT, PROT, ALBUMIN in the last 168 hours. No results for input(s): LIPASE, AMYLASE in the last 168 hours. No results for input(s): AMMONIA in the last 168 hours. CBC:  Recent Labs Lab 11/06/14 1458 11/07/14 0358  WBC 16.4* 15.6*  NEUTROABS  14.0*  --   HGB 11.7* 12.3*  HCT 35.1* 36.3*  MCV 89.1 88.3  PLT 230 201   Cardiac Enzymes:  Recent Labs Lab 11/06/14 1458 11/07/14 0358  TROPONINI >65.00* >65.00*   CBG:  Recent Labs Lab 11/06/14 2305 11/07/14 0743  GLUCAP 159* 154*    Iron Studies: No results for input(s): IRON, TIBC, TRANSFERRIN, FERRITIN in the last 72 hours. Studies/Results: Dg Chest Port 1 View  11/06/2014   CLINICAL DATA:  Midsternal chest pain began last night.  EXAM: PORTABLE CHEST - 1 VIEW  COMPARISON:  None.  FINDINGS: There is no focal parenchymal opacity. There is no pleural effusion or pneumothorax. The heart and mediastinal contours are unremarkable. There is evidence of prior CABG.  The osseous structures are unremarkable.  IMPRESSION: No active disease.   Electronically Signed   By: Kathreen Devoid   On: 11/06/2014 15:40   Medications: Infusions: . sodium chloride 1 mL/kg/hr (11/06/14 2030)  . heparin 1,250 Units/hr (11/07/14 0115)  . nitroGLYCERIN      Scheduled Medications: . amLODipine  10 mg Oral Daily  . aspirin  324 mg Oral NOW   Or  . aspirin  300 mg Rectal NOW  . aspirin EC  81 mg Oral Daily  .  Influenza vac split quadrivalent PF  0.5 mL Intramuscular Tomorrow-1000  . insulin aspart  0-15 Units Subcutaneous TID WC  . insulin aspart  0-5 Units Subcutaneous QHS  . labetalol  200 mg Oral BID  . latanoprost  1 drop Both Eyes QHS  . simvastatin  20 mg Oral QHS  . sodium chloride  3 mL Intravenous Q12H  . timolol  1 drop Both Eyes Daily    have reviewed scheduled and prn medications.  Physical Exam: General: NAD- appears younger than his age Heart: RRR Lungs: mostly clear Abdomen: soft, non tender Extremities: no edema    11/07/2014,9:41 AM  LOS: 1 day

## 2014-11-07 NOTE — Care Management Note (Signed)
Case Management Note  Patient Details  Name: [name] MRN: [id number] Date of Birth: November 07, 1931  Subjective/Objective:      Adm w mi              Action/Plan: lives alone, pcp dr Ulyess Blossom   Expected Discharge Date:                  Expected Discharge Plan:  Home/Self Care  In-House Referral:     Discharge planning Services     Post Acute Care Choice:    Choice offered to:     DME Arranged:    DME Agency:     HH Arranged:    [place] Agency:     Status of Service:     Medicare Important Message Given:    Date Medicare IM Given:    Medicare IM give by:    Date Additional Medicare IM Given:    Additional Medicare Important Message give by:     If discussed at [place] of Stay Meetings, dates discussed:    Additional Comments: ur review done  Lacretia Leigh, RN 11/07/2014, 9:10 AM

## 2014-11-07 NOTE — Progress Notes (Signed)
ANTICOAGULATION CONSULT NOTE - Follow Up Consult  Pharmacy Consult for heparin Indication: ACS  Labs:  Recent Labs  11/06/14 1458 11/06/14 2354 11/07/14 0358 11/07/14 0940  HGB 11.7*  --  12.3*  --   HCT 35.1*  --  36.3*  --   PLT 230  --  201  --   LABPROT  --   --  17.8*  --   INR  --   --  1.46  --   HEPARINUNFRC  --  0.19*  --  0.33  CREATININE 3.54*  --  3.28*  --   TROPONINI >65.00*  --  >65.00*  --      Assessment: 81yo male admitted with ACS plan cath lab today Heparin drip 1200 uts/hr HL 0.33 CBC stable.   F/u after cath  Goal of Therapy:  Heparin level 0.3-0.7 units/ml Monitor platelets by anticoagulation protocol: Yes   Plan:  Continue Heparin drip 1200 uts/hr Daily CBC, HL  Bonnita Nasuti Pharm.D. CPP, BCPS Clinical Pharmacist [phone] 11/07/2014 11:48 AM

## 2014-11-07 NOTE — Progress Notes (Signed)
Pt to cath lab,VSS, NAD, AAOx4, son at bedside. Consent signed, heparin shut off per md order on call to cath lab. Rn escorted pt to cath lab suite.

## 2014-11-08 DIAGNOSIS — I2119 ST elevation (STEMI) myocardial infarction involving other coronary artery of inferior wall: Secondary | ICD-10-CM

## 2014-11-08 DIAGNOSIS — I10 Essential (primary) hypertension: Secondary | ICD-10-CM

## 2014-11-08 LAB — RENAL FUNCTION PANEL
ANION GAP: 13 (ref 5–15)
Albumin: 3 g/dL — ABNORMAL LOW (ref 3.5–5.0)
BUN: 48 mg/dL — ABNORMAL HIGH (ref 6–20)
CALCIUM: 8.7 mg/dL — AB (ref 8.9–10.3)
CHLORIDE: 102 mmol/L (ref 101–111)
CO2: 18 mmol/L — AB (ref 22–32)
CREATININE: 3.48 mg/dL — AB (ref 0.61–1.24)
GFR, EST AFRICAN AMERICAN: 18 mL/min — AB (ref >60)
GFR, EST NON AFRICAN AMERICAN: 15 mL/min — AB (ref >60)
Glucose, Bld: 138 mg/dL — ABNORMAL HIGH (ref 65–99)
Phosphorus: 4.4 mg/dL (ref 2.5–4.6)
Potassium: 4.3 mmol/L (ref 3.5–5.1)
Sodium: 133 mmol/L — ABNORMAL LOW (ref 135–145)

## 2014-11-08 LAB — CBC
HCT: 32.1 % — ABNORMAL LOW (ref 39.0–52.0)
HEMOGLOBIN: 10.7 g/dL — AB (ref 13.0–17.0)
MCH: 29.5 pg (ref 26.0–34.0)
MCHC: 33.3 g/dL (ref 30.0–36.0)
MCV: 88.4 fL (ref 78.0–100.0)
Platelets: 216 10*3/uL (ref 150–400)
RBC: 3.63 MIL/uL — ABNORMAL LOW (ref 4.22–5.81)
RDW: 14.6 % (ref 11.5–15.5)
WBC: 12.4 10*3/uL — ABNORMAL HIGH (ref 4.0–10.5)

## 2014-11-08 LAB — HEPARIN LEVEL (UNFRACTIONATED)
Heparin Unfractionated: 0.14 IU/mL — ABNORMAL LOW (ref 0.30–0.70)
Heparin Unfractionated: 0.32 IU/mL (ref 0.30–0.70)

## 2014-11-08 LAB — GLUCOSE, CAPILLARY
GLUCOSE-CAPILLARY: 132 mg/dL — AB (ref 65–99)
GLUCOSE-CAPILLARY: 137 mg/dL — AB (ref 65–99)
GLUCOSE-CAPILLARY: 145 mg/dL — AB (ref 65–99)
Glucose-Capillary: 147 mg/dL — ABNORMAL HIGH (ref 65–99)
Glucose-Capillary: 152 mg/dL — ABNORMAL HIGH (ref 65–99)

## 2014-11-08 LAB — TROPONIN I: TROPONIN I: 79.78 ng/mL — AB (ref <0.031)

## 2014-11-08 MED ORDER — CLOPIDOGREL BISULFATE 75 MG PO TABS
75.0000 mg | ORAL_TABLET | Freq: Every day | ORAL | Status: DC
  Administered 2014-11-09 – 2014-11-12 (×4): 75 mg via ORAL
  Filled 2014-11-08 (×4): qty 1

## 2014-11-08 MED ORDER — CLOPIDOGREL BISULFATE 300 MG PO TABS
300.0000 mg | ORAL_TABLET | Freq: Once | ORAL | Status: AC
  Administered 2014-11-08: 300 mg via ORAL
  Filled 2014-11-08: qty 1

## 2014-11-08 MED ORDER — ATORVASTATIN CALCIUM 80 MG PO TABS
80.0000 mg | ORAL_TABLET | Freq: Every day | ORAL | Status: DC
  Administered 2014-11-08 – 2014-11-11 (×4): 80 mg via ORAL
  Filled 2014-11-08 (×4): qty 1

## 2014-11-08 MED ORDER — FUROSEMIDE 40 MG PO TABS
40.0000 mg | ORAL_TABLET | Freq: Two times a day (BID) | ORAL | Status: DC
  Administered 2014-11-08 – 2014-11-09 (×3): 40 mg via ORAL
  Filled 2014-11-08 (×3): qty 1

## 2014-11-08 MED ORDER — HEPARIN (PORCINE) IN NACL 100-0.45 UNIT/ML-% IJ SOLN
1800.0000 [IU]/h | INTRAMUSCULAR | Status: DC
  Administered 2014-11-08: 1500 [IU]/h via INTRAVENOUS
  Administered 2014-11-09 – 2014-11-10 (×2): 1800 [IU]/h via INTRAVENOUS
  Filled 2014-11-08 (×4): qty 250

## 2014-11-08 MED ORDER — CLOPIDOGREL BISULFATE 75 MG PO TABS: 75.0000 mg | ORAL_TABLET | Freq: Every day | ORAL | Status: DC

## 2014-11-08 MED ORDER — HEPARIN BOLUS VIA INFUSION
2400.0000 [IU] | Freq: Once | INTRAVENOUS | Status: AC
  Administered 2014-11-08: 2400 [IU] via INTRAVENOUS
  Filled 2014-11-08: qty 2400

## 2014-11-08 NOTE — Progress Notes (Signed)
Interventional Note   Note he had recurrent arm pain which was his anginal equivalent.  If this is not suppressed on medical therapy, may need to consider sequential SVG PCI, depending on kidney status.  We will continue to follow.

## 2014-11-08 NOTE — Progress Notes (Signed)
SUBJECTIVE: The patient is doing well today.  + some arm pain overnight, now resolved (anginal equivalent)  At this time, he denies chest pain, shortness of breath, or any new concerns.  Marland Kitchen amLODipine  10 mg Oral Daily  . aspirin EC  81 mg Oral Daily  . [START ON 11/09/2014] clopidogrel  75 mg Oral Daily  . furosemide  40 mg Oral BID  . Influenza vac split quadrivalent PF  0.5 mL Intramuscular Tomorrow-1000  . insulin aspart  0-15 Units Subcutaneous TID WC  . insulin aspart  0-5 Units Subcutaneous QHS  . labetalol  200 mg Oral BID  . latanoprost  1 drop Both Eyes QHS  . sodium chloride  3 mL Intravenous Q12H  . sodium chloride  3 mL Intravenous Q12H  . timolol  1 drop Both Eyes Daily   . heparin 1,250 Units/hr (11/08/14 0015)  . nitroGLYCERIN 15 mcg/min (11/08/14 1200)    OBJECTIVE: Physical Exam: Filed Vitals:   11/08/14 1011 11/08/14 1100 11/08/14 1152 11/08/14 1200  BP: 126/58 121/61  128/52  Pulse: 81 83  85  Temp:   98.5 F (36.9 C)   TempSrc:      Resp: 18 20  18   Height:      Weight:      SpO2: 93% 94%  93%    Intake/Output Summary (Last 24 hours) at 11/08/14 1243 Last data filed at 11/08/14 1200  Gross per 24 hour  Intake  858.4 ml  Output   1975 ml  Net -1116.6 ml    Telemetry reveals sinus rhythm with PVCs  GEN- The patient is well appearing, alert and oriented x 3 today.   Head- normocephalic, atraumatic Eyes-  Sclera clear, conjunctiva pink Ears- hearing intact Oropharynx- clear Neck- supple  Lungs- Clear to ausculation bilaterally, normal work of breathing Heart- Regular rate and rhythm, no murmurs, rubs or gallops, PMI not laterally displaced GI- soft, NT, ND, + BS Extremities- no clubbing, cyanosis, or edema Skin- no rash or lesion Psych- euthymic mood, full affect Neuro- strength and sensation are intact  LABS: Basic Metabolic Panel:  Recent Labs  11/07/14 0358 11/08/14 0255  NA 134* 133*  K 4.2 4.3  CL 102 102  CO2 21* 18*    GLUCOSE 156* 138*  BUN 44* 48*  CREATININE 3.28* 3.48*  CALCIUM 9.1 8.7*  PHOS  --  4.4   Liver Function Tests:  Recent Labs  11/08/14 0255  ALBUMIN 3.0*   No results for input(s): LIPASE, AMYLASE in the last 72 hours. CBC:  Recent Labs  11/06/14 1458 11/07/14 0358 11/08/14 0255  WBC 16.4* 15.6* 12.4*  NEUTROABS 14.0*  --   --   HGB 11.7* 12.3* 10.7*  HCT 35.1* 36.3* 32.1*  MCV 89.1 88.3 88.4  PLT 230 201 216   Cardiac Enzymes:  Recent Labs  11/06/14 1458 11/07/14 0358  TROPONINI >65.00* >65.00*   BNP: Invalid input(s): POCBNP D-Dimer: No results for input(s): DDIMER in the last 72 hours. Hemoglobin A1C: No results for input(s): HGBA1C in the last 72 hours. Fasting Lipid Panel:  Recent Labs  11/07/14 0358  CHOL 118  HDL 37*  LDLCALC 55  TRIG 129  CHOLHDL 3.2    ASSESSMENT AND PLAN:  Principal Problem:   Myocardial infarct-out of hospital Active Problems:   Diabetes mellitus with renal complications   Dyslipidemia   Essential hypertension   Coronary atherosclerosis, CABG in 2003   Peptic ulcer disease- 2013   Chronic renal disease,  stage IV-pt declines dialysis   Elevated troponin   NSTEMI (non-ST elevated myocardial infarction)  1. Acute MI Doing reasonably well Weaning IV nitro, convert to oral tomorrow Continue IV heparin Continue asa, plavix, beta blocker  2. Chronic renal failure Stable Appreciate renal input  3. HTN Stable No change required today  4. HL On statin previously but not continued for some reason here. Will try atorvastatin 80mg  qhs  Keep in ICU today, likely to stepdown tomorrow  Thompson Grayer, MD 11/08/2014 12:43 PM

## 2014-11-08 NOTE — Progress Notes (Signed)
CRITICAL VALUE ALERT  Critical value received:  Troponin 79.7  Date of notification: 11/08/14  Time of notification:  2313  Critical value read back: yes  Nurse who received alert:  Duanne Moron RN  MD notified:  Marquette Saa MD called back at 23:16.

## 2014-11-08 NOTE — Progress Notes (Signed)
ANTICOAGULATION CONSULT NOTE - Follow Up Consult  Pharmacy Consult for heparin Indication: CAD  Labs:  Recent Labs  11/06/14 1458  11/07/14 0358  11/08/14 0255 11/08/14 0945 11/08/14 1625 11/08/14 1948  HGB 11.7*  --  12.3*  --  10.7*  --   --   --   HCT 35.1*  --  36.3*  --  32.1*  --   --   --   PLT 230  --  201  --  216  --   --   --   LABPROT  --   --  17.8*  --   --   --   --   --   INR  --   --  1.46  --   --   --   --   --   HEPARINUNFRC  --   < >  --   < > <0.10* 0.14*  --  0.32  CREATININE 3.54*  --  3.28*  --  3.48*  --   --   --   TROPONINI >65.00*  --  >65.00*  --   --   --  >65.00*  --   < > = values in this interval not displayed.   Assessment/Plan:  81yo male subtherapeutic on heparin after resumed.  Heparin level therapeutic this PM  Goal of therapy: HL 0.3-0.7  Plan: Heparin to 1600 units / hr Daily HL and CBC Monitor s/sx bleeding  Thank  you Anette Guarneri, PharmD [phone] 8:34 PM, 11/08/14

## 2014-11-08 NOTE — Progress Notes (Signed)
ANTICOAGULATION CONSULT NOTE - Follow Up Consult  Pharmacy Consult for heparin Indication: CAD  Labs:  Recent Labs  11/06/14 1458 11/06/14 2354 11/07/14 0358 11/07/14 0940 11/08/14 0255  HGB 11.7*  --  12.3*  --  10.7*  HCT 35.1*  --  36.3*  --  32.1*  PLT 230  --  201  --  216  LABPROT  --   --  17.8*  --   --   INR  --   --  1.46  --   --   HEPARINUNFRC  --  0.19*  --  0.33 <0.10*  CREATININE 3.54*  --  3.28*  --   --   TROPONINI >65.00*  --  >65.00*  --   --      Assessment/Plan:  81yo male subtherapeutic on heparin after resumed though has been running <3hr before lab drawn. Will continue gtt at current rate for now and check additional level.   Wynona Neat, PharmD, BCPS  11/08/2014,4:37 AM

## 2014-11-08 NOTE — Progress Notes (Signed)
Subjective:  S/p cath- found vein graft occlusion- plan is to use plavix to attempt to keep open as intervention on that vein graft would require considerable dye.  Pt with arm pain overnight but better with tylenol.  Also found during cath elevated LVEDP.  Making urine with lasix and creatinine really unchanged Objective Vital signs in last 24 hours: Filed Vitals:   11/08/14 0405 11/08/14 0500 11/08/14 0600 11/08/14 0700  BP:  135/60 142/69 137/69  Pulse:  94 82 91  Temp: 98.2 F (36.8 C)     TempSrc: Oral     Resp:  24 20 20   Height:      Weight:  78.7 kg (173 lb 8 oz)    SpO2:  92% 89% 89%   Weight change: -3.2 kg (-7 lb 0.9 oz)  Intake/Output Summary (Last 24 hours) at 11/08/14 0843 Last data filed at 11/08/14 0600  Gross per 24 hour  Intake  959.8 ml  Output   2125 ml  Net -1165.2 ml    Assessment/ Plan: Pt is a 81 y.o. yo male with advanced CKD who was admitted on 11/06/2014 with NSTEMI in need of cardiac cath   Assessment/Plan: 1. Renal- advanced CKD at baseline.  Now softening on the stance of not considering dialysis therapy.  S/p cardiac cath- so far so good with great UOP and stable but poor renal function.   Will take it day by day and will likely consider dialysis initiation should it become needed.  Mostly interested in home PD but understands this would not be the initial modality if he requires dialysis this admit 2. CV- NSTEMI with high risk myoview-cath results as above- attempting medical management for now  3. Anemia- not a significant issue at this time 4. HTN/vol- seems well controlled on amlodipine and labetalol and some IV nitro- but with elevated LVEDP will give lasix- probably responsible for the hyponatremia as well.  Will start at 40 BID PO but may need more since GFR is so low   Cniyah Sproull A    Labs: Basic Metabolic Panel:  Recent Labs Lab 11/06/14 1458 11/07/14 0358 11/08/14 0255  NA 137 134* 133*  K 5.0 4.2 4.3  CL 101 102 102  CO2  25 21* 18*  GLUCOSE 154* 156* 138*  BUN 39* 44* 48*  CREATININE 3.54* 3.28* 3.48*  CALCIUM 10.1 9.1 8.7*  PHOS  --   --  4.4   Liver Function Tests:  Recent Labs Lab 11/08/14 0255  ALBUMIN 3.0*   No results for input(s): LIPASE, AMYLASE in the last 168 hours. No results for input(s): AMMONIA in the last 168 hours. CBC:  Recent Labs Lab 11/06/14 1458 11/07/14 0358 11/08/14 0255  WBC 16.4* 15.6* 12.4*  NEUTROABS 14.0*  --   --   HGB 11.7* 12.3* 10.7*  HCT 35.1* 36.3* 32.1*  MCV 89.1 88.3 88.4  PLT 230 201 216   Cardiac Enzymes:  Recent Labs Lab 11/06/14 1458 11/07/14 0358  TROPONINI >65.00* >65.00*   CBG:  Recent Labs Lab 11/07/14 1145 11/07/14 1810 11/07/14 2028 11/08/14 0022 11/08/14 0806  GLUCAP 124* 142* 223* 137* 147*    Iron Studies: No results for input(s): IRON, TIBC, TRANSFERRIN, FERRITIN in the last 72 hours. Studies/Results: Dg Chest Port 1 View  11/06/2014   CLINICAL DATA:  Midsternal chest pain began last night.  EXAM: PORTABLE CHEST - 1 VIEW  COMPARISON:  None.  FINDINGS: There is no focal parenchymal opacity. There is no pleural effusion or  pneumothorax. The heart and mediastinal contours are unremarkable. There is evidence of prior CABG.  The osseous structures are unremarkable.  IMPRESSION: No active disease.   Electronically Signed   By: Kathreen Devoid   On: 11/06/2014 15:40   Medications: Infusions: . heparin 1,250 Units/hr (11/08/14 0015)  . nitroGLYCERIN 20 mcg/min (11/08/14 0800)    Scheduled Medications: . amLODipine  10 mg Oral Daily  . aspirin EC  81 mg Oral Daily  . Influenza vac split quadrivalent PF  0.5 mL Intramuscular Tomorrow-1000  . insulin aspart  0-15 Units Subcutaneous TID WC  . insulin aspart  0-5 Units Subcutaneous QHS  . labetalol  200 mg Oral BID  . latanoprost  1 drop Both Eyes QHS  . sodium chloride  3 mL Intravenous Q12H  . sodium chloride  3 mL Intravenous Q12H  . timolol  1 drop Both Eyes Daily    have  reviewed scheduled and prn medications.  Physical Exam: General: NAD- appears younger than his age Heart: RRR Lungs: mostly clear Abdomen: soft, non tender Extremities: no edema    11/08/2014,8:43 AM  LOS: 2 days

## 2014-11-08 NOTE — Progress Notes (Signed)
ANTICOAGULATION CONSULT NOTE - Follow Up Consult  Pharmacy Consult for heparin Indication: CAD  Labs:  Recent Labs  11/06/14 1458  11/07/14 0358 11/07/14 0940 11/08/14 0255 11/08/14 0945  HGB 11.7*  --  12.3*  --  10.7*  --   HCT 35.1*  --  36.3*  --  32.1*  --   PLT 230  --  201  --  216  --   LABPROT  --   --  17.8*  --   --   --   INR  --   --  1.46  --   --   --   HEPARINUNFRC  --   < >  --  0.33 <0.10* 0.14*  CREATININE 3.54*  --  3.28*  --  3.48*  --   TROPONINI >65.00*  --  >65.00*  --   --   --   < > = values in this interval not displayed.   Assessment/Plan:  81yo male subtherapeutic on heparin after resumed. HL continues to be subtherapeutic at 0.14. Hgb 10.7, plt wnl.  Goal of therapy: HL 0.3-0.7  Plan: Heparin 2400 unit bolus x 1 Increase heparin to 1500 units/h 2000 HL Daily HL and CBC Monitor s/sx bleeding    Heloise Ochoa, Pharm.D. PGY2 Cardiology Pharmacy Resident Pager: [phone] 1:27 PM, 11/08/14

## 2014-11-09 DIAGNOSIS — I214 Non-ST elevation (NSTEMI) myocardial infarction: Secondary | ICD-10-CM | POA: Diagnosis not present

## 2014-11-09 DIAGNOSIS — E871 Hypo-osmolality and hyponatremia: Secondary | ICD-10-CM | POA: Diagnosis not present

## 2014-11-09 DIAGNOSIS — I2582 Chronic total occlusion of coronary artery: Secondary | ICD-10-CM | POA: Diagnosis not present

## 2014-11-09 DIAGNOSIS — I25810 Atherosclerosis of coronary artery bypass graft(s) without angina pectoris: Secondary | ICD-10-CM | POA: Diagnosis not present

## 2014-11-09 DIAGNOSIS — E1122 Type 2 diabetes mellitus with diabetic chronic kidney disease: Secondary | ICD-10-CM | POA: Diagnosis not present

## 2014-11-09 DIAGNOSIS — I252 Old myocardial infarction: Secondary | ICD-10-CM | POA: Diagnosis not present

## 2014-11-09 DIAGNOSIS — N184 Chronic kidney disease, stage 4 (severe): Secondary | ICD-10-CM | POA: Diagnosis not present

## 2014-11-09 DIAGNOSIS — N179 Acute kidney failure, unspecified: Secondary | ICD-10-CM | POA: Diagnosis not present

## 2014-11-09 DIAGNOSIS — I2510 Atherosclerotic heart disease of native coronary artery without angina pectoris: Secondary | ICD-10-CM | POA: Diagnosis not present

## 2014-11-09 LAB — CBC
HCT: 34.1 % — ABNORMAL LOW (ref 39.0–52.0)
HEMOGLOBIN: 11.6 g/dL — AB (ref 13.0–17.0)
MCH: 30 pg (ref 26.0–34.0)
MCHC: 34 g/dL (ref 30.0–36.0)
MCV: 88.1 fL (ref 78.0–100.0)
Platelets: 225 10*3/uL (ref 150–400)
RBC: 3.87 MIL/uL — ABNORMAL LOW (ref 4.22–5.81)
RDW: 14.7 % (ref 11.5–15.5)
WBC: 10.7 10*3/uL — ABNORMAL HIGH (ref 4.0–10.5)

## 2014-11-09 LAB — GLUCOSE, CAPILLARY
GLUCOSE-CAPILLARY: 173 mg/dL — AB (ref 65–99)
GLUCOSE-CAPILLARY: 163 mg/dL — AB (ref 65–99)
Glucose-Capillary: 152 mg/dL — ABNORMAL HIGH (ref 65–99)
Glucose-Capillary: 125 mg/dL — ABNORMAL HIGH (ref 65–99)

## 2014-11-09 LAB — BASIC METABOLIC PANEL
Anion gap: 12 (ref 5–15)
BUN: 65 mg/dL — AB (ref 6–20)
CHLORIDE: 102 mmol/L (ref 101–111)
CO2: 18 mmol/L — AB (ref 22–32)
CREATININE: 3.97 mg/dL — AB (ref 0.61–1.24)
Calcium: 8.8 mg/dL — ABNORMAL LOW (ref 8.9–10.3)
GFR calc Af Amer: 15 mL/min — ABNORMAL LOW (ref >60)
GFR calc non Af Amer: 13 mL/min — ABNORMAL LOW (ref >60)
GLUCOSE: 147 mg/dL — AB (ref 65–99)
Potassium: 4 mmol/L (ref 3.5–5.1)
SODIUM: 132 mmol/L — AB (ref 135–145)

## 2014-11-09 LAB — HEPARIN LEVEL (UNFRACTIONATED)
HEPARIN UNFRACTIONATED: 0.26 [IU]/mL — AB (ref 0.30–0.70)
Heparin Unfractionated: 0.49 IU/mL (ref 0.30–0.70)

## 2014-11-09 LAB — TROPONIN I: Troponin I: 64.26 ng/mL (ref <0.031)

## 2014-11-09 MED ORDER — POLYETHYLENE GLYCOL 3350 17 G PO PACK
17.0000 g | PACK | Freq: Every day | ORAL | Status: DC | PRN
Start: 2014-11-09 — End: 2014-11-12
  Administered 2014-11-09 – 2014-11-11 (×3): 17 g via ORAL
  Filled 2014-11-09 (×3): qty 1

## 2014-11-09 MED ORDER — METOPROLOL TARTRATE 50 MG PO TABS
50.0000 mg | ORAL_TABLET | Freq: Four times a day (QID) | ORAL | Status: DC
  Administered 2014-11-09 – 2014-11-10 (×3): 50 mg via ORAL
  Filled 2014-11-09 (×3): qty 1

## 2014-11-09 MED ORDER — SENNA 8.6 MG PO TABS
1.0000 | ORAL_TABLET | Freq: Two times a day (BID) | ORAL | Status: DC
  Administered 2014-11-09 – 2014-11-12 (×6): 8.6 mg via ORAL
  Filled 2014-11-09 (×6): qty 1

## 2014-11-09 MED ORDER — FUROSEMIDE 80 MG PO TABS
80.0000 mg | ORAL_TABLET | Freq: Two times a day (BID) | ORAL | Status: DC
Start: 2014-11-09 — End: 2014-11-11
  Administered 2014-11-09 – 2014-11-11 (×4): 80 mg via ORAL
  Filled 2014-11-09 (×4): qty 1

## 2014-11-09 MED ORDER — ISOSORBIDE MONONITRATE ER 60 MG PO TB24
60.0000 mg | ORAL_TABLET | Freq: Every day | ORAL | Status: DC
  Administered 2014-11-09 – 2014-11-11 (×3): 60 mg via ORAL
  Filled 2014-11-09 (×3): qty 1

## 2014-11-09 NOTE — Progress Notes (Addendum)
ANTICOAGULATION CONSULT NOTE - Follow Up Consult  Pharmacy Consult for heparin Indication: CAD  Labs:  Recent Labs  11/07/14 0358  11/08/14 0255  11/08/14 1625 11/08/14 1948 11/08/14 2135 11/09/14 0328 11/09/14 1250  HGB 12.3*  --  10.7*  --   --   --   --  11.6*  --   HCT 36.3*  --  32.1*  --   --   --   --  34.1*  --   PLT 201  --  216  --   --   --   --  225  --   LABPROT 17.8*  --   --   --   --   --   --   --   --   INR 1.46  --   --   --   --   --   --   --   --   HEPARINUNFRC  --   < > <0.10*  < >  --  0.32  --  0.26* 0.49  CREATININE 3.28*  --  3.48*  --   --   --   --  3.97*  --   TROPONINI >65.00*  --   --   --  >65.00*  --  79.78* 64.26*  --   < > = values in this interval not displayed.   Assessment/Plan:  81yo male admitted 11/06/2014 for NSTEMI with hx of CAD s/p CI ('90) and CABG x 5 ('03). HL 0.49 therapeutic on heparin 1800 units/h. Hgb 11.4, plt wnl.  Goal of therapy: HL 0.3-0.7  Plan: Continue heparin 1800 units/h Daily HL and CBC Monitor s/sx bleeding    Heloise Ochoa, Pharm.D. PGY2 Cardiology Pharmacy Resident Pager: [phone] 2:02 PM, 11/09/2014

## 2014-11-09 NOTE — Progress Notes (Signed)
Subjective:     Making urine although less (some may have not been recorded)  with lasix  and creatinine up- no more CP- does not appear uremic Objective Vital signs in last 24 hours: Filed Vitals:   11/09/14 0400 11/09/14 0500 11/09/14 0600 11/09/14 0700  BP: 128/64 124/92 140/61 138/65  Pulse: 90 81 84 82  Temp:   98.4 F (36.9 C) 97.9 F (36.6 C)  TempSrc:    Oral  Resp: 18 17 26 17   Height:      Weight:  78.9 kg (173 lb 15.1 oz)    SpO2: 90% 92% 92% 87%   Weight change: 0.5 kg (1 lb 1.6 oz)  Intake/Output Summary (Last 24 hours) at 11/09/14 0844 Last data filed at 11/09/14 0736  Gross per 24 hour  Intake    981 ml  Output   1100 ml  Net   -119 ml    Assessment/ Plan: Pt is Holt 81 y.o. yo male with advanced CKD who was admitted on 11/06/2014 with NSTEMI in need of cardiac cath   Assessment/Plan: 1. Renal- advanced CKD at baseline.  Now softening on the stance of not considering dialysis therapy.  S/p cardiac cath-now seeing Holt little less UOP and creatinine worse.   Will take it day by day and will likely consider dialysis initiation should it become needed- no indication as of yet.  Mostly interested in home PD but understands this would not be the initial modality if he requires dialysis this admit 2. CV- NSTEMI with high risk myoview-cath found vein graft occlusion- plan is to use plavix to attempt to keep open as intervention on that vein graft would require considerable dye. attempting medical management for now - no symptoms 3. Anemia- not Holt significant issue at this time 4. HTN/vol- seems well controlled on amlodipine and labetalol - but with elevated LVEDP - probably responsible for the hyponatremia as well.  Started lasix at 40 BID , will increase to 80 BID today and follow   [name]    Labs: Basic Metabolic Panel:  Recent Labs Lab 11/07/14 0358 11/08/14 0255 11/09/14 0328  NA 134* 133* 132*  K 4.2 4.3 4.0  CL 102 102 102  CO2 21* 18* 18*  GLUCOSE  156* 138* 147*  BUN 44* 48* 65*  CREATININE 3.28* 3.48* 3.97*  CALCIUM 9.1 8.7* 8.8*  PHOS  --  4.4  --    Liver Function Tests:  Recent Labs Lab 11/08/14 0255  ALBUMIN 3.0*   No results for input(s): LIPASE, AMYLASE in the last 168 hours. No results for input(s): AMMONIA in the last 168 hours. CBC:  Recent Labs Lab 11/06/14 1458 11/07/14 0358 11/08/14 0255 11/09/14 0328  WBC 16.4* 15.6* 12.4* 10.7*  NEUTROABS 14.0*  --   --   --   HGB 11.7* 12.3* 10.7* 11.6*  HCT 35.1* 36.3* 32.1* 34.1*  MCV 89.1 88.3 88.4 88.1  PLT 230 201 216 225   Cardiac Enzymes:  Recent Labs Lab 11/06/14 1458 11/07/14 0358 11/08/14 1625 11/08/14 2135 11/09/14 0328  TROPONINI >65.00* >65.00* >65.00* 79.78* 64.26*   CBG:  Recent Labs Lab 11/08/14 0806 11/08/14 1150 11/08/14 1641 11/08/14 2119 11/09/14 0732  GLUCAP 147* 145* 152* 132* 152*    Iron Studies: No results for input(s): IRON, TIBC, TRANSFERRIN, FERRITIN in the last 72 hours. Studies/Results: No results found. Medications: Infusions: . heparin 1,800 Units/hr (11/09/14 0438)  . nitroGLYCERIN 5 mcg/min (11/08/14 2200)    Scheduled Medications: . amLODipine  10 mg Oral Daily  . aspirin EC  81 mg Oral Daily  . atorvastatin  80 mg Oral q1800  . clopidogrel  75 mg Oral Daily  . furosemide  40 mg Oral BID  . Influenza vac split quadrivalent PF  0.5 mL Intramuscular Tomorrow-1000  . insulin aspart  0-15 Units Subcutaneous TID WC  . insulin aspart  0-5 Units Subcutaneous QHS  . labetalol  200 mg Oral BID  . latanoprost  1 drop Both Eyes QHS  . sodium chloride  3 mL Intravenous Q12H  . sodium chloride  3 mL Intravenous Q12H  . timolol  1 drop Both Eyes Daily    have reviewed scheduled and prn medications.  Physical Exam: General: NAD- appears younger than his age Heart: RRR Lungs: mostly clear Abdomen: soft, non tender Extremities: no edema    11/09/2014,8:44 AM  LOS: 3 days

## 2014-11-09 NOTE — Progress Notes (Addendum)
SUBJECTIVE: The patient is doing well today.  No discomfort overnight.  He is better today.  No further arm pain.  Has been up to chair but not ambulatory yet.  At this time, he denies chest pain, shortness of breath, or any new concerns.  Marland Kitchen amLODipine  10 mg Oral Daily  . aspirin EC  81 mg Oral Daily  . atorvastatin  80 mg Oral q1800  . clopidogrel  75 mg Oral Daily  . furosemide  80 mg Oral BID  . Influenza vac split quadrivalent PF  0.5 mL Intramuscular Tomorrow-1000  . insulin aspart  0-15 Units Subcutaneous TID WC  . insulin aspart  0-5 Units Subcutaneous QHS  . labetalol  200 mg Oral BID  . latanoprost  1 drop Both Eyes QHS  . sodium chloride  3 mL Intravenous Q12H  . sodium chloride  3 mL Intravenous Q12H  . timolol  1 drop Both Eyes Daily   . heparin 1,800 Units/hr (11/09/14 1031)  . nitroGLYCERIN 5 mcg/min (11/08/14 2200)    OBJECTIVE: Physical Exam: Filed Vitals:   11/09/14 0600 11/09/14 0700 11/09/14 0800 11/09/14 0900  BP: 140/61 138/65 133/68 141/61  Pulse: 84 82 82 88  Temp: 98.4 F (36.9 C) 97.9 F (36.6 C)    TempSrc:  Oral    Resp: 26 17 20 27   Height:      Weight:      SpO2: 92% 94% 95% 97%    Intake/Output Summary (Last 24 hours) at 11/09/14 1039 Last data filed at 11/09/14 1031  Gross per 24 hour  Intake  981.5 ml  Output   1000 ml  Net  -18.5 ml    Telemetry reveals sinus rhythm with PACs  GEN- The patient is well appearing, alert and oriented x 3 today.   Head- normocephalic, atraumatic Eyes-  Sclera clear, conjunctiva pink Ears- hearing intact Oropharynx- clear Neck- supple  Lungs- few basilar rales, normal work of breathing Heart- Regular rate and rhythm, no murmurs, rubs or gallops, PMI not laterally displaced GI- soft, NT, ND, + BS Extremities- no clubbing, cyanosis, or edema Skin- no rash or lesion Psych- euthymic mood, full affect Neuro- strength and sensation are intact  LABS: Basic Metabolic Panel:  Recent Labs  11/08/14 0255 11/09/14 0328  NA 133* 132*  K 4.3 4.0  CL 102 102  CO2 18* 18*  GLUCOSE 138* 147*  BUN 48* 65*  CREATININE 3.48* 3.97*  CALCIUM 8.7* 8.8*  PHOS 4.4  --    Liver Function Tests:  Recent Labs  11/08/14 0255  ALBUMIN 3.0*   No results for input(s): LIPASE, AMYLASE in the last 72 hours. CBC:  Recent Labs  11/06/14 1458  11/08/14 0255 11/09/14 0328  WBC 16.4*  < > 12.4* 10.7*  NEUTROABS 14.0*  --   --   --   HGB 11.7*  < > 10.7* 11.6*  HCT 35.1*  < > 32.1* 34.1*  MCV 89.1  < > 88.4 88.1  PLT 230  < > 216 225  < > = values in this interval not displayed. Cardiac Enzymes:  Recent Labs  11/08/14 1625 11/08/14 2135 11/09/14 0328  TROPONINI >65.00* 79.78* 64.26*   Fasting Lipid Panel:  Recent Labs  11/07/14 0358  CHOL 118  HDL 37*  LDLCALC 55  TRIG 129  CHOLHDL 3.2    ASSESSMENT AND PLAN:  Principal Problem:   Myocardial infarct-out of hospital Active Problems:   Diabetes mellitus with renal complications  Dyslipidemia   Essential hypertension   Coronary atherosclerosis, CABG in 2003   Peptic ulcer disease- 2013   Chronic renal disease, stage IV-pt declines dialysis   Elevated troponin   NSTEMI (non-ST elevated myocardial infarction)  1. Acute MI Doing better Now off of IV nitro and having no angina Will restart imdur at home dose of 60mg  daily.  This should be further titrated as tolerated. Will convert labetalol to metoprolol.  Will need to watch BP in doing this.  Will start as metoprolol 50mg  Q6 and aggressively titrate as HR/ BP allow.  Will need to be consolidated after target HR/BP are reached. Continue IV heparin another day Continue asa, plavix  Needs cardiac rehab long term Will begin gentle ambulation in the room and assess for angina with light activity Perhaps we may be able to avoid cath, particularly with increased creatinine overnight.  2. Chronic renal failure Slightly increased creatinine today Appreciate renal  input.  Plans to increase lasix are noted.  3. HTN Stable Follow this closely with conversion of labetalol to metoprolol  4. HL tolerating atorvastatin 80mg  qhs (started yesterday)  Transfer to stepdown today Begin gentle ambulation Will consult cardiac rehab in am  Thompson Grayer, MD 11/09/2014 10:39 AM

## 2014-11-09 NOTE — Progress Notes (Signed)
ANTICOAGULATION CONSULT NOTE - Follow Up Consult  Pharmacy Consult for heparin Indication: CAD  Labs:  Recent Labs  11/06/14 1458  11/07/14 0358  11/08/14 0255 11/08/14 0945 11/08/14 1625 11/08/14 1948 11/08/14 2135 11/09/14 0328  HGB 11.7*  --  12.3*  --  10.7*  --   --   --   --  11.6*  HCT 35.1*  --  36.3*  --  32.1*  --   --   --   --  34.1*  PLT 230  --  201  --  216  --   --   --   --  225  LABPROT  --   --  17.8*  --   --   --   --   --   --   --   INR  --   --  1.46  --   --   --   --   --   --   --   HEPARINUNFRC  --   < >  --   < > <0.10* 0.14*  --  0.32  --  0.26*  CREATININE 3.54*  --  3.28*  --  3.48*  --   --   --   --   --   TROPONINI >65.00*  --  >65.00*  --   --   --  >65.00*  --  79.78*  --   < > = values in this interval not displayed.   Assessment: 81yo male remains subtherapeutic on heparin after one level at low end of goal.  Goal of Therapy:  Heparin level 0.3-0.7 units/ml   Plan:  Will increase heparin gtt by 2-3 units/kg/hr to 1800 units/hr and check level in [place], PharmD, BCPS  11/09/2014,4:38 AM

## 2014-11-10 ENCOUNTER — Encounter (HOSPITAL_COMMUNITY): Payer: Self-pay | Admitting: Interventional Cardiology

## 2014-11-10 DIAGNOSIS — R7989 Other specified abnormal findings of blood chemistry: Secondary | ICD-10-CM

## 2014-11-10 DIAGNOSIS — I25110 Atherosclerotic heart disease of native coronary artery with unstable angina pectoris: Secondary | ICD-10-CM

## 2014-11-10 LAB — BASIC METABOLIC PANEL
Anion gap: 13 (ref 5–15)
BUN: 76 mg/dL — AB (ref 6–20)
CHLORIDE: 102 mmol/L (ref 101–111)
CO2: 18 mmol/L — ABNORMAL LOW (ref 22–32)
CREATININE: 4.16 mg/dL — AB (ref 0.61–1.24)
Calcium: 8.8 mg/dL — ABNORMAL LOW (ref 8.9–10.3)
GFR calc Af Amer: 14 mL/min — ABNORMAL LOW (ref >60)
GFR, EST NON AFRICAN AMERICAN: 12 mL/min — AB (ref >60)
Glucose, Bld: 142 mg/dL — ABNORMAL HIGH (ref 65–99)
Potassium: 3.9 mmol/L (ref 3.5–5.1)
SODIUM: 133 mmol/L — AB (ref 135–145)

## 2014-11-10 LAB — CBC
HCT: 30.5 % — ABNORMAL LOW (ref 39.0–52.0)
Hemoglobin: 10.3 g/dL — ABNORMAL LOW (ref 13.0–17.0)
MCH: 29.5 pg (ref 26.0–34.0)
MCHC: 33.8 g/dL (ref 30.0–36.0)
MCV: 87.4 fL (ref 78.0–100.0)
PLATELETS: 254 10*3/uL (ref 150–400)
RBC: 3.49 MIL/uL — ABNORMAL LOW (ref 4.22–5.81)
RDW: 14.7 % (ref 11.5–15.5)
WBC: 8.4 10*3/uL (ref 4.0–10.5)

## 2014-11-10 LAB — GLUCOSE, CAPILLARY
GLUCOSE-CAPILLARY: 132 mg/dL — AB (ref 65–99)
Glucose-Capillary: 162 mg/dL — ABNORMAL HIGH (ref 65–99)
Glucose-Capillary: 136 mg/dL — ABNORMAL HIGH (ref 65–99)
Glucose-Capillary: 132 mg/dL — ABNORMAL HIGH (ref 65–99)

## 2014-11-10 LAB — HEPARIN LEVEL (UNFRACTIONATED): Heparin Unfractionated: <0.1 IU/mL — ABNORMAL LOW (ref 0.30–0.70)

## 2014-11-10 MED ORDER — GUAIFENESIN 100 MG/5ML PO SYRP
200.0000 mg | ORAL_SOLUTION | ORAL | Status: DC | PRN
  Administered 2014-11-10: 200 mg via ORAL
  Filled 2014-11-10 (×3): qty 10

## 2014-11-10 MED ORDER — METOPROLOL TARTRATE 100 MG PO TABS
100.0000 mg | ORAL_TABLET | Freq: Two times a day (BID) | ORAL | Status: DC
  Administered 2014-11-10 – 2014-11-12 (×4): 100 mg via ORAL
  Filled 2014-11-10 (×4): qty 1

## 2014-11-10 MED FILL — Nitroglycerin IV Soln 100 MCG/ML in D5W: INTRA_ARTERIAL | Qty: 10 | Status: AC

## 2014-11-10 NOTE — Progress Notes (Signed)
CARDIAC REHAB PHASE I   PRE:  Rate/Rhythm: 80 SR  BP:  Supine: 130/61  Sitting:   Standing:    SaO2: 95% 2L  MODE:  Ambulation: 350 ft   POST:  Rate/Rhythm: 87 SR  BP:  Supine: 121/60  Sitting:   Standing:    SaO2: 89-94%RA 0910-1010 Pt sleepy. Walked 350 ft on RA with me monitoring sats the whole walk. Pt would dip to 89% but quickly to 95% and maintained above 90% majority of walk. No DOE or CP during walk. Used gait belt but pt did not want to use walker and did not need to hold to side rail. Back to bed after walk and put back on oxygen since sleepy. Son in room and started ed with both. Reviewed NTG use, gave heart healthy diet, reviewed MI restrictions and discussed CRP 2. Pt may need family to re enforce ed as he was napping at times. Will refer to [place] Phase 2 and gave son brochure. Pt goes to [facility] twice a week now.  Will follow up tomorrow.   Graylon Good, RN BSN  11/10/2014 10:06 AM

## 2014-11-10 NOTE — Progress Notes (Signed)
Patient Name: [name] Date of Encounter: 11/10/2014  Primary Cardiologist: Dr Tamala Julian   Principal Problem:   Myocardial infarct-out of hospital Active Problems:   Diabetes mellitus with renal complications   Dyslipidemia   Essential hypertension   Coronary atherosclerosis, CABG in 2003   Peptic ulcer disease- 2013   Chronic renal disease, stage IV-pt declines dialysis   Elevated troponin   NSTEMI (non-ST elevated myocardial infarction)    SUBJECTIVE  Denies any CP or SOB. No recurrent arm pain (his anginal equivalent) last night.  CURRENT MEDS . amLODipine  10 mg Oral Daily  . aspirin EC  81 mg Oral Daily  . atorvastatin  80 mg Oral q1800  . clopidogrel  75 mg Oral Daily  . furosemide  80 mg Oral BID  . insulin aspart  0-15 Units Subcutaneous TID WC  . insulin aspart  0-5 Units Subcutaneous QHS  . isosorbide mononitrate  60 mg Oral Daily  . latanoprost  1 drop Both Eyes QHS  . metoprolol tartrate  50 mg Oral 4 times per day  . senna  1 tablet Oral BID  . sodium chloride  3 mL Intravenous Q12H  . sodium chloride  3 mL Intravenous Q12H  . timolol  1 drop Both Eyes Daily    OBJECTIVE  Filed Vitals:   11/09/14 2038 11/09/14 2300 11/10/14 0527 11/10/14 0554  BP: 124/63 124/61 119/57   Pulse: 84  91   Temp: 98.1 F (36.7 C) 98.8 F (37.1 C) 98.7 F (37.1 C)   TempSrc: Oral Oral Oral   Resp:      Height:      Weight:   173 lb 1.6 oz (78.518 kg)   SpO2: 94% 92% 89% 94%    Intake/Output Summary (Last 24 hours) at 11/10/14 0809 Last data filed at 11/10/14 0500  Gross per 24 hour  Intake    969 ml  Output    950 ml  Net     19 ml   Filed Weights   11/08/14 0500 11/09/14 0500 11/10/14 0527  Weight: 173 lb 8 oz (78.7 kg) 173 lb 15.1 oz (78.9 kg) 173 lb 1.6 oz (78.518 kg)    PHYSICAL EXAM  General: Pleasant, NAD. Neuro: Alert and oriented X 3. Moves all extremities spontaneously. Psych: Normal affect. HEENT:  Normal  Neck: Supple without bruits  or JVD. Lungs:  Resp regular and unlabored. Bibasilar rale, otherwise CTA Heart: RRR no s3, s4, or murmurs. Abdomen: Soft, non-tender, non-distended, BS + x 4.  Extremities: No clubbing, cyanosis or edema. DP/PT/Radials 2+ and equal bilaterally.  Accessory Clinical Findings  CBC  Recent Labs  11/09/14 0328 11/10/14 0405  WBC 10.7* 8.4  HGB 11.6* 10.3*  HCT 34.1* 30.5*  MCV 88.1 87.4  PLT 225 0000000   Basic Metabolic Panel  Recent Labs  11/08/14 0255 11/09/14 0328 11/10/14 0405  NA 133* 132* 133*  K 4.3 4.0 3.9  CL 102 102 102  CO2 18* 18* 18*  GLUCOSE 138* 147* 142*  BUN 48* 65* 76*  CREATININE 3.48* 3.97* 4.16*  CALCIUM 8.7* 8.8* 8.8*  PHOS 4.4  --   --    Liver Function Tests  Recent Labs  11/08/14 0255  ALBUMIN 3.0*   Cardiac Enzymes  Recent Labs  11/08/14 1625 11/08/14 2135 11/09/14 0328  TROPONINI >65.00* 79.78* 64.26*    TELE NSR without significant ventricular ectopy    ECG  No new EKG  Echocardiogram 11/07/2014  LV EF: 40% -  45%  ------------------------------------------------------------------- Indications:   Chest pain 786.51.  ------------------------------------------------------------------- History:  PMH:  Coronary artery disease. PMH:  Myocardial infarction. Risk factors: Hypertension. Dyslipidemia.  ------------------------------------------------------------------- Study Conclusions  - Left ventricle: The cavity size was normal. Wall thickness was normal. Systolic function was mildly to moderately reduced. The estimated ejection fraction was in the range of 40% to 45%. Akinesis of the inferior myocardium. - Mitral valve: There was mild regurgitation. - Pulmonary arteries: Systolic pressure was moderately increased. PA peak pressure: 46 mm Hg (S).    Radiology/Studies  Dg Chest Port 1 View  11/06/2014   CLINICAL DATA:  Midsternal chest pain began last night.  EXAM: PORTABLE CHEST - 1 VIEW   COMPARISON:  None.  FINDINGS: There is no focal parenchymal opacity. There is no pleural effusion or pneumothorax. The heart and mediastinal contours are unremarkable. There is evidence of prior CABG.  The osseous structures are unremarkable.  IMPRESSION: No active disease.   Electronically Signed   By: Kathreen Devoid   On: 11/06/2014 15:40    ASSESSMENT AND PLAN  Pleasant 81 y/o AA male followed by Dr Tamala Julian with a history of CAD, h/o remote MI PCI in the 90's followed by CABG x 5 in 2003. He was admitted in July with [place] noted to have worsening renal function with acute on CKD stage 3/4, he had high risk myobiew, but he would not go on dialysis and discharged home on medical therapy. He presented back on 11/06/2014 with acute MI. EKG showed inferior Q wave with suggestion of ST elevation. This time he agreed to undergo HD if needed, planned for cath on 11/06/2013.   1. Acute MI with recent high risk myoview  - Echo 09/11/2014 EF 55-60%, mildly to moderately calcified AV with either mild to moderate gradient (recommeneded CTA and TEE if clinically indicated)  - Echo 11/07/2014 EF 40-45%, akinesis of inferior myocardium, mild MR, PA peak pressure 54mmhg, no AS or AR on this echo  - cath 11/07/2014 bypass graft failure with total occlusion of SVG to RCA, total occlusion of SVG to diagonal, thrombotic high grade stenosis throughout entirety of the sequential SVG to OM1/OM2, patent LIMA to LAD with distal LAD collaterals to RCA, LVEDP 35-55mmHg  - per Dr. Tamala Julian, it is felt that sequential SVG to OM1/OM2 was completed occluded resulting in elevated enzyme, subsequently recanalized on IV heparin. Final decision of PCI to sequential OM will based upon clinical course, if recurrent angina, may need to consider PCI. Lasix for severely elevated LVEDP. Plavix started.   - continue ASA, plavix, Imdur, lipitor, amlodipine, change metoprolol from 50mg  QID to 100mg  BID.   - if ambulated without chest discomfort this afternoon,  may stop IV heparin. Would like to avoid PCI if possible as long as he has no recurrent CP   2. Acute on chronic renal insufficiency: stage IV  - s/p contrast dye on 11/07/2014. LVEDP 35-20mmHg on cath 9/9. Cr on arrival 3.28 on cath, now 4.16. Likely combination of lasix and contrast. Difficult to assess fluid level, bibasilar rale maybe atelectasis, however cannot r/o edema. Only -542ml since arrival. Currently on 80mg  BID of PO lasix (previously on 40mg  daily at home). May need to decrease lasix dose.    - nurse to check O2 need during ambulation today, likely can wean off [place]  3. CAD s/p CABG  4. HTN 5. HLD  Signed, Woodward Ku Pager: [phone]  The patient was seen, examined and discussed with Almyra Deforest, PA-C  and I agree with the above.   81 year old male with h/o cath 11/07/2014 bypass graft failure with total occlusion of SVG to RCA, total occlusion of SVG to diagonal, thrombotic high grade stenosis throughout entirety of the sequential SVG to OM1/OM2, patent LIMA to LAD with distal LAD collaterals to RCA, LVEDP 35-82mmHg. Currently still on iv heparin, asymptomatic with ambulation, I will d/c heparin and reevaluate in the am. If still no chest pain, we will discharge with no intervention.  Lasix for severely elevated LVEDP. Plavix started, continue ASA, plavix, Imdur, lipitor, amlodipine, metoprolol dose increased.   Dorothy Spark 11/10/2014

## 2014-11-10 NOTE — Care Management Important Message (Signed)
Important Message  Patient Details  Name: [name] MRN: [id number] Date of Birth: 03-Jul-1931   Medicare Important Message Given:  Yes-second notification given    [name] May 11/10/2014, 1:09 PM

## 2014-11-10 NOTE — Progress Notes (Signed)
Refer to 0815. Originially entered at wrong time slot.

## 2014-11-10 NOTE — Care Management Important Message (Signed)
Important Message  Patient Details  Name: [name] MRN: [id number] Date of Birth: 1931/10/17   Medicare Important Message Given:  Yes-second notification given    [name] May 11/10/2014, 1:11 PM

## 2014-11-10 NOTE — Progress Notes (Signed)
Patient ID: [name], male   DOB: Nov 23, 1931, 81 y.o.   MRN: [id number]  [facility] Progress Note   Assessment/ Plan:   1. Renal failure on chronic kidney disease stage IV- advanced CKD at baseline. Unfortunately, with rising creatinine but without any acute dialysis needs at this time-we'll continue to follow urine output/labs everyday to assess for needs to intervene. It appears that he is softening on his stance on dialysis and may undertake chronic RRT (his goal is to do peritoneal dialysis independently at home) with a bridge for in center hemodialysis 2. CV- NSTEMI with high risk myoview-cath found vein graft occlusion- plan is to use plavix to attempt to keep open as intervention on that vein graft would require considerable dye. Maximizing medical management per cardiology. 3. Anemia- hemoglobin appears to be drifting down without any overt losses-continue to monitor for iron/ESA needs 4. HTN/vol- seems well controlled on amlodipine and labetalol - furosemide dose increased yesterday to 80 mg twice a day from 40 mg twice a day-will decrease back to 40 mg twice a day tomorrow based on labs/volume status especially with his rising creatinine.  Subjective:   Denies any chest pain or shortness of breath-ambulated hallways this morning    Objective:   BP 129/69 mmHg  Pulse 76  Temp(Src) 98.7 F (37.1 C) (Oral)  Resp 20  Ht 6' (1.829 m)  Wt 78.518 kg (173 lb 1.6 oz)  BMI 23.47 kg/m2  SpO2 98%  Intake/Output Summary (Last 24 hours) at 11/10/14 1032 Last data filed at 11/10/14 0900  Gross per 24 hour  Intake 1345.8 ml  Output    950 ml  Net  395.8 ml   Weight change: -0.382 kg (-13.5 oz)  Physical Exam: Gen: Comfortably resting in bed CVS: Pulse regular in rate and rhythm, S1 and S2 normal Resp: Clear to auscultation, no rales/rhonchi Abd: Soft, flat, nontender Ext: No edema over lower extremities  Imaging: No results found.  Labs: BMET  Recent  Labs Lab 11/06/14 1458 11/07/14 0358 11/08/14 0255 11/09/14 0328 11/10/14 0405  NA 137 134* 133* 132* 133*  K 5.0 4.2 4.3 4.0 3.9  CL 101 102 102 102 102  CO2 25 21* 18* 18* 18*  GLUCOSE 154* 156* 138* 147* 142*  BUN 39* 44* 48* 65* 76*  CREATININE 3.54* 3.28* 3.48* 3.97* 4.16*  CALCIUM 10.1 9.1 8.7* 8.8* 8.8*  PHOS  --   --  4.4  --   --    CBC  Recent Labs Lab 11/06/14 1458 11/07/14 0358 11/08/14 0255 11/09/14 0328 11/10/14 0405  WBC 16.4* 15.6* 12.4* 10.7* 8.4  NEUTROABS 14.0*  --   --   --   --   HGB 11.7* 12.3* 10.7* 11.6* 10.3*  HCT 35.1* 36.3* 32.1* 34.1* 30.5*  MCV 89.1 88.3 88.4 88.1 87.4  PLT 230 201 216 225 254    Medications:    . amLODipine  10 mg Oral Daily  . aspirin EC  81 mg Oral Daily  . atorvastatin  80 mg Oral q1800  . clopidogrel  75 mg Oral Daily  . furosemide  80 mg Oral BID  . insulin aspart  0-15 Units Subcutaneous TID WC  . insulin aspart  0-5 Units Subcutaneous QHS  . isosorbide mononitrate  60 mg Oral Daily  . latanoprost  1 drop Both Eyes QHS  . metoprolol tartrate  100 mg Oral BID  . senna  1 tablet Oral BID  . sodium chloride  3  mL Intravenous Q12H  . sodium chloride  3 mL Intravenous Q12H  . timolol  1 drop Both Eyes Daily   Elmarie Shiley, MD 11/10/2014, 10:32 AM

## 2014-11-10 NOTE — Progress Notes (Addendum)
ANTICOAGULATION CONSULT NOTE - Follow Up Consult  Pharmacy Consult for heparin Indication: CAD  Labs:  Recent Labs  11/08/14 0255  11/08/14 1625  11/08/14 2135 11/09/14 0328 11/09/14 1250 11/10/14 0405  HGB 10.7*  --   --   --   --  11.6*  --  10.3*  HCT 32.1*  --   --   --   --  34.1*  --  30.5*  PLT 216  --   --   --   --  225  --  254  HEPARINUNFRC <0.10*  < >  --   < >  --  0.26* 0.49 <0.10*  CREATININE 3.48*  --   --   --   --  3.97*  --  4.16*  TROPONINI  --   --  >65.00*  --  79.78* 64.26*  --   --   < > = values in this interval not displayed.   Assessment/Plan:  81yo male admitted 11/06/2014 for NSTEMI with hx of CAD s/p CI ('90) and CABG x 5 ('03). Heparin level now down to undetectable on 1800 units/hr. Hgb down today also but no over bleeding noted. No issues with line per RN.  Goal of therapy: HL 0.3-0.7  Plan: Increase heparin to 2000 units/h F/u 8 hr heparin level  Sherlon Handing, PharmD, BCPS Clinical pharmacist, pager [phone] 11/10/2014 6:30 AM  Addendum: Rn went into the room after speaking to the pharmacist and the IV heparin line had infiltrated. Plan to restart new IV site.  Plan: Will continue heparin at 1800 units/hr and f/u heparin restart and timing of new level.  Sherlon Handing, PharmD, BCPS Clinical pharmacist, pager [phone] 11/10/2014 6:40 AM

## 2014-11-11 LAB — RENAL FUNCTION PANEL
Albumin: 3.3 g/dL — ABNORMAL LOW (ref 3.5–5.0)
Anion gap: 13 (ref 5–15)
BUN: 82 mg/dL — AB (ref 6–20)
CHLORIDE: 102 mmol/L (ref 101–111)
CO2: 20 mmol/L — ABNORMAL LOW (ref 22–32)
Calcium: 9.4 mg/dL (ref 8.9–10.3)
Creatinine, Ser: 4.13 mg/dL — ABNORMAL HIGH (ref 0.61–1.24)
GFR calc Af Amer: 14 mL/min — ABNORMAL LOW (ref >60)
GFR calc non Af Amer: 12 mL/min — ABNORMAL LOW (ref >60)
GLUCOSE: 134 mg/dL — AB (ref 65–99)
POTASSIUM: 4 mmol/L (ref 3.5–5.1)
Phosphorus: 4.5 mg/dL (ref 2.5–4.6)
Sodium: 135 mmol/L (ref 135–145)

## 2014-11-11 LAB — GLUCOSE, CAPILLARY
GLUCOSE-CAPILLARY: 126 mg/dL — AB (ref 65–99)
Glucose-Capillary: 173 mg/dL — ABNORMAL HIGH (ref 65–99)
Glucose-Capillary: 126 mg/dL — ABNORMAL HIGH (ref 65–99)
Glucose-Capillary: 122 mg/dL — ABNORMAL HIGH (ref 65–99)

## 2014-11-11 MED ORDER — ISOSORBIDE MONONITRATE ER 60 MG PO TB24
90.0000 mg | ORAL_TABLET | Freq: Every day | ORAL | Status: DC
  Administered 2014-11-12: 90 mg via ORAL
  Filled 2014-11-11 (×2): qty 1

## 2014-11-11 MED ORDER — MENTHOL 3 MG MT LOZG
1.0000 | LOZENGE | OROMUCOSAL | Status: DC | PRN
  Administered 2014-11-11: 3 mg via ORAL
  Filled 2014-11-11: qty 9

## 2014-11-11 MED ORDER — FUROSEMIDE 40 MG PO TABS
40.0000 mg | ORAL_TABLET | Freq: Two times a day (BID) | ORAL | Status: DC
  Administered 2014-11-11 – 2014-11-12 (×2): 40 mg via ORAL
  Filled 2014-11-11 (×2): qty 1

## 2014-11-11 NOTE — Progress Notes (Signed)
Patient ID: LLIAM Holt, male   DOB: Jul 13, 1931, 81 y.o.   MRN: [id number]  [facility] Progress Note   Assessment/ Plan:   1. Renal failure on chronic kidney disease stage IV- advanced CKD at baseline. Excellent UOP overnight and labs pending from this AM. No subjective complaints to suggest uremia and is close to euvolemic on exam. Will decrease lasix dose to 40mg  PO BID (previously on 40mg  daily). No uremic findings/acute HD needs.  2. CV- NSTEMI with high risk myoview-cath found total SVG to RCA occlusion with other areas of stenosis- medical therapy maximized. 3. Anemia- hemoglobin appears to be drifting down without any overt losses-continue to monitor for iron/ESA needs 4. HTN/vol- seems well controlled on amlodipine and labetalol - furosemide dose now 40 mg twice a day.  Subjective:   Denies any chest pain or shortness of breath-ambulated yesterday and walked around room/shaved without problems today    Objective:   BP 143/69 mmHg  Pulse 85  Temp(Src) 98.4 F (36.9 C) (Oral)  Resp 16  Ht 6' (1.829 m)  Wt 78.291 kg (172 lb 9.6 oz)  BMI 23.40 kg/m2  SpO2 95%  Intake/Output Summary (Last 24 hours) at 11/11/14 1011 Last data filed at 11/11/14 0825  Gross per 24 hour  Intake   1272 ml  Output   2325 ml  Net  -1053 ml   Weight change: -0.227 kg (-8 oz)  Physical Exam: Gen: Comfortably resting in recliner CVS: Pulse regular in rate and rhythm, S1 and S2 normal Resp: Clear to auscultation, no rales/rhonchi Abd: Soft, flat, nontender Ext: No edema over lower extremities  Imaging: No results found.  Labs: BMET  Recent Labs Lab 11/06/14 1458 11/07/14 0358 11/08/14 0255 11/09/14 0328 11/10/14 0405  NA 137 134* 133* 132* 133*  K 5.0 4.2 4.3 4.0 3.9  CL 101 102 102 102 102  CO2 25 21* 18* 18* 18*  GLUCOSE 154* 156* 138* 147* 142*  BUN 39* 44* 48* 65* 76*  CREATININE 3.54* 3.28* 3.48* 3.97* 4.16*  CALCIUM 10.1 9.1 8.7* 8.8* 8.8*  PHOS  --    --  4.4  --   --    CBC  Recent Labs Lab 11/06/14 1458 11/07/14 0358 11/08/14 0255 11/09/14 0328 11/10/14 0405  WBC 16.4* 15.6* 12.4* 10.7* 8.4  NEUTROABS 14.0*  --   --   --   --   HGB 11.7* 12.3* 10.7* 11.6* 10.3*  HCT 35.1* 36.3* 32.1* 34.1* 30.5*  MCV 89.1 88.3 88.4 88.1 87.4  PLT 230 201 216 225 254    Medications:    . amLODipine  10 mg Oral Daily  . aspirin EC  81 mg Oral Daily  . atorvastatin  80 mg Oral q1800  . clopidogrel  75 mg Oral Daily  . furosemide  80 mg Oral BID  . insulin aspart  0-15 Units Subcutaneous TID WC  . insulin aspart  0-5 Units Subcutaneous QHS  . isosorbide mononitrate  60 mg Oral Daily  . latanoprost  1 drop Both Eyes QHS  . metoprolol tartrate  100 mg Oral BID  . senna  1 tablet Oral BID  . sodium chloride  3 mL Intravenous Q12H  . sodium chloride  3 mL Intravenous Q12H  . timolol  1 drop Both Eyes Daily   Elmarie Shiley, MD 11/11/2014, 10:11 AM

## 2014-11-11 NOTE — Progress Notes (Signed)
Spoke w/ pt in room.   He is doing well and had no chest pain today with ambulation. He is tolerating the medications well.  He is looking forward to discharge in am.   Rosaria Ferries, PA-C 11/11/2014 6:00 PM Beeper [phone]

## 2014-11-11 NOTE — Progress Notes (Signed)
CARDIAC REHAB PHASE I   PRE:  Rate/Rhythm: 68 (by pulse ox, off monitor)    BP: sitting 107/53    SaO2: 93 RA  MODE:  Ambulation: 450 ft   POST:  Rate/Rhythm: 74     BP: sitting 115/44     SaO2: 98 RA  Pt with flat affect and HOH. Able to walk fairly steady until toward end and he c/o weakness/fatigue. Plopped onto EOB uncontrolled instead of going to recliner. Pt sts he had some SOB and weakness. Only offers what he is asked. Discussed ex gl and NTG with pt but did not review diet as no family present. Will f/u. Pt plans to attend [facility]II. [id number]  Josephina Shih [place] CES, ACSM 11/11/2014 11:44 AM

## 2014-11-11 NOTE — Progress Notes (Signed)
Patient Name: [name] Date of Encounter: 11/11/2014  Primary Cardiologist: Dr Tamala Julian   Principal Problem:   Myocardial infarct-out of hospital Active Problems:   Diabetes mellitus with renal complications   Dyslipidemia   Essential hypertension   Coronary atherosclerosis, CABG in 2003   Peptic ulcer disease- 2013   Chronic renal disease, stage IV-pt declines dialysis   Elevated troponin   NSTEMI (non-ST elevated myocardial infarction)   SUBJECTIVE  Denies any CP, DOE, slightly worse than yesterday.   CURRENT MEDS . amLODipine  10 mg Oral Daily  . aspirin EC  81 mg Oral Daily  . atorvastatin  80 mg Oral q1800  . clopidogrel  75 mg Oral Daily  . furosemide  40 mg Oral BID  . insulin aspart  0-15 Units Subcutaneous TID WC  . insulin aspart  0-5 Units Subcutaneous QHS  . isosorbide mononitrate  60 mg Oral Daily  . latanoprost  1 drop Both Eyes QHS  . metoprolol tartrate  100 mg Oral BID  . senna  1 tablet Oral BID  . sodium chloride  3 mL Intravenous Q12H  . sodium chloride  3 mL Intravenous Q12H  . timolol  1 drop Both Eyes Daily    OBJECTIVE  Filed Vitals:   11/10/14 2023 11/11/14 0500 11/11/14 1030 11/11/14 1031  BP: 143/69 143/69 143/118 143/118  Pulse: 93 85 72   Temp: 99.7 F (37.6 C) 98.4 F (36.9 C)    TempSrc: Oral Oral    Resp: 16     Height:      Weight:  172 lb 9.6 oz (78.291 kg)    SpO2: 97% 95%      Intake/Output Summary (Last 24 hours) at 11/11/14 1309 Last data filed at 11/11/14 1029  Gross per 24 hour  Intake    922 ml  Output   1750 ml  Net   -828 ml   Filed Weights   11/09/14 0500 11/10/14 0527 11/11/14 0500  Weight: 173 lb 15.1 oz (78.9 kg) 173 lb 1.6 oz (78.518 kg) 172 lb 9.6 oz (78.291 kg)    PHYSICAL EXAM  General: Pleasant, NAD. Neuro: Alert and oriented X 3. Moves all extremities spontaneously. Psych: Normal affect. HEENT:  Normal  Neck: Supple without bruits or JVD. Lungs:  Resp regular and unlabored.  Bibasilar rale, otherwise CTA Heart: RRR no s3, s4, or murmurs. Abdomen: Soft, non-tender, non-distended, BS + x 4.  Extremities: No clubbing, cyanosis or edema. DP/PT/Radials 2+ and equal bilaterally.  Accessory Clinical Findings  CBC  Recent Labs  11/09/14 0328 11/10/14 0405  WBC 10.7* 8.4  HGB 11.6* 10.3*  HCT 34.1* 30.5*  MCV 88.1 87.4  PLT 225 0000000   Basic Metabolic Panel  Recent Labs  11/10/14 0405 11/11/14 1055  NA 133* 135  K 3.9 4.0  CL 102 102  CO2 18* 20*  GLUCOSE 142* 134*  BUN 76* 82*  CREATININE 4.16* 4.13*  CALCIUM 8.8* 9.4  PHOS  --  4.5   Liver Function Tests  Recent Labs  11/11/14 1055  ALBUMIN 3.3*   Cardiac Enzymes  Recent Labs  11/08/14 1625 11/08/14 2135 11/09/14 0328  TROPONINI >65.00* 79.78* 64.26*   TELE NSR without significant ventricular ectopy   ECG  No new EKG  Echocardiogram 11/07/2014  LV EF: 40% -  45%  ------------------------------------------------------------------- Indications:   Chest pain 786.51.  ------------------------------------------------------------------- History:  PMH:  Coronary artery disease. PMH:  Myocardial infarction. Risk factors: Hypertension. Dyslipidemia.  -------------------------------------------------------------------  Study Conclusions  - Left ventricle: The cavity size was normal. Wall thickness was normal. Systolic function was mildly to moderately reduced. The estimated ejection fraction was in the range of 40% to 45%. Akinesis of the inferior myocardium. - Mitral valve: There was mild regurgitation. - Pulmonary arteries: Systolic pressure was moderately increased. PA peak pressure: 46 mm Hg (S).    Radiology/Studies  Dg Chest Port 1 View  11/06/2014   CLINICAL DATA:  Midsternal chest pain began last night.  EXAM: PORTABLE CHEST - 1 VIEW  COMPARISON:  None.  FINDINGS: There is no focal parenchymal opacity. There is no pleural effusion or pneumothorax. The  heart and mediastinal contours are unremarkable. There is evidence of prior CABG.  The osseous structures are unremarkable.  IMPRESSION: No active disease.   Electronically Signed   By: Kathreen Devoid   On: 11/06/2014 15:40    ASSESSMENT AND PLAN  Pleasant 81 y/o AA male followed by Dr Tamala Julian with a history of CAD, h/o remote MI PCI in the 90's followed by CABG x 5 in 2003. He was admitted in July with [place] noted to have worsening renal function with acute on CKD stage 3/4, he had high risk myobiew, but he would not go on dialysis and discharged home on medical therapy. He presented back on 11/06/2014 with acute MI. EKG showed inferior Q wave with suggestion of ST elevation. This time he agreed to undergo HD if needed, planned for cath on 11/06/2013.   1. Acute MI with recent high risk myoview  - Echo 09/11/2014 EF 55-60%, mildly to moderately calcified AV with either mild to moderate gradient (recommeneded CTA and TEE if clinically indicated)  - Echo 11/07/2014 EF 40-45%, akinesis of inferior myocardium, mild MR, PA peak pressure 7mmhg, no AS or AR on this echo  - cath 11/07/2014 bypass graft failure with total occlusion of SVG to RCA, total occlusion of SVG to diagonal, thrombotic high grade stenosis throughout entirety of the sequential SVG to OM1/OM2, patent LIMA to LAD with distal LAD collaterals to RCA, LVEDP 35-36mmHg  - per Dr. Tamala Julian, it is felt that sequential SVG to OM1/OM2 was completed occluded resulting in elevated enzyme, subsequently recanalized on IV heparin. Final decision of PCI to sequential OM will based upon clinical course, if recurrent angina, may need to consider PCI. Lasix for severely elevated LVEDP. Plavix started.   - continue ASA, plavix, Imdur, lipitor, amlodipine, change metoprolol from 50mg  QID to 100mg  BID.   - if ambulated without chest discomfort this afternoon, may stop IV heparin. Would like to avoid PCI if possible as long as he has no recurrent CP   2. Acute on chronic renal  insufficiency: stage IV  - s/p contrast dye on 11/07/2014. LVEDP 35-101mmHg on cath 9/9. Cr on arrival 3.28 on cath, now 4.16. Likely combination of lasix and contrast. Difficult to assess fluid level, bibasilar rale maybe atelectasis, however cannot r/o edema. Only -54ml since arrival. Currently on 80mg  BID of PO lasix (previously on 40mg  daily at home). May need to decrease lasix dose.    - nurse to check O2 need during ambulation today, likely can wean off Marceline  3. CAD s/p CABG  4. HTN 5. HLD  81 year old male with h/o cath 11/07/2014 bypass graft failure with total occlusion of SVG to RCA, total occlusion of SVG to diagonal, thrombotic high grade stenosis throughout entirety of the sequential SVG to OM1/OM2, patent LIMA to LAD with distal LAD collaterals to RCA,  LVEDP 35-46mmHg. Currently off iv heparin, with mildly worsened DOE, but he walked more than before. He is advised to continue walking, if no worsening DOE we will discharge with no intervention.  Lasix for severely elevated LVEDP. Plavix started, continue ASA, plavix, Imdur, lipitor, amlodipine, metoprolol dose increased. BP elevated, increase imdur to 90 mg po daily.   Dorothy Spark 11/11/2014

## 2014-11-12 ENCOUNTER — Other Ambulatory Visit: Payer: Self-pay | Admitting: Physician Assistant

## 2014-11-12 DIAGNOSIS — N189 Chronic kidney disease, unspecified: Secondary | ICD-10-CM

## 2014-11-12 LAB — RENAL FUNCTION PANEL
ALBUMIN: 3.2 g/dL — AB (ref 3.5–5.0)
ANION GAP: 14 (ref 5–15)
BUN: 85 mg/dL — ABNORMAL HIGH (ref 6–20)
CO2: 21 mmol/L — ABNORMAL LOW (ref 22–32)
Calcium: 9.4 mg/dL (ref 8.9–10.3)
Chloride: 100 mmol/L — ABNORMAL LOW (ref 101–111)
Creatinine, Ser: 4.25 mg/dL — ABNORMAL HIGH (ref 0.61–1.24)
GFR, EST AFRICAN AMERICAN: 14 mL/min — AB (ref >60)
GFR, EST NON AFRICAN AMERICAN: 12 mL/min — AB (ref >60)
Glucose, Bld: 147 mg/dL — ABNORMAL HIGH (ref 65–99)
PHOSPHORUS: 5.5 mg/dL — AB (ref 2.5–4.6)
POTASSIUM: 4 mmol/L (ref 3.5–5.1)
Sodium: 135 mmol/L (ref 135–145)

## 2014-11-12 LAB — GLUCOSE, CAPILLARY
GLUCOSE-CAPILLARY: 191 mg/dL — AB (ref 65–99)
Glucose-Capillary: 156 mg/dL — ABNORMAL HIGH (ref 65–99)

## 2014-11-12 MED ORDER — METOPROLOL TARTRATE 100 MG PO TABS: 100.0000 mg | ORAL_TABLET | Freq: Two times a day (BID) | ORAL | Status: DC

## 2014-11-12 MED ORDER — FUROSEMIDE 40 MG PO TABS: 40.0000 mg | ORAL_TABLET | Freq: Two times a day (BID) | ORAL | Status: DC

## 2014-11-12 MED ORDER — ISOSORBIDE MONONITRATE ER 30 MG PO TB24: 90.0000 mg | ORAL_TABLET | Freq: Every day | ORAL | Status: DC

## 2014-11-12 MED ORDER — ATORVASTATIN CALCIUM 80 MG PO TABS: 80.0000 mg | ORAL_TABLET | Freq: Every day | ORAL | Status: DC

## 2014-11-12 MED ORDER — CLOPIDOGREL BISULFATE 75 MG PO TABS: 75.0000 mg | ORAL_TABLET | Freq: Every day | ORAL | Status: DC

## 2014-11-12 MED ORDER — NITROGLYCERIN 0.4 MG SL SUBL: 0.4000 mg | SUBLINGUAL_TABLET | SUBLINGUAL | Status: DC | PRN

## 2014-11-12 NOTE — Discharge Summary (Signed)
Discharge Summary   Patient ID: [name],  MRN: [id number], DOB/AGE: 79-Jul-1933 79 y.o.  Admit date: 11/06/2014 Discharge date: 11/12/2014  Primary Care Provider: [name] Primary Cardiologist: Dr. Tamala Julian  Discharge Diagnoses Principal Problem:   Myocardial infarct-out of hospital Active Problems:   Diabetes mellitus with renal complications   Dyslipidemia   Essential hypertension   Coronary atherosclerosis, CABG in 2003   Peptic ulcer disease- 2013   Chronic renal disease, stage IV-pt declines dialysis   Elevated troponin   NSTEMI (non-ST elevated myocardial infarction)   Allergies No Known Allergies  Procedures  Echocardiogram 11/07/2014 LV EF: 40% -  45%  ------------------------------------------------------------------- Indications:   Chest pain 786.51.  ------------------------------------------------------------------- History:  PMH:  Coronary artery disease. PMH:  Myocardial infarction. Risk factors: Hypertension. Dyslipidemia.  ------------------------------------------------------------------- Study Conclusions  - Left ventricle: The cavity size was normal. Wall thickness was normal. Systolic function was mildly to moderately reduced. The estimated ejection fraction was in the range of 40% to 45%. Akinesis of the inferior myocardium. - Mitral valve: There was mild regurgitation. - Pulmonary arteries: Systolic pressure was moderately increased. PA peak pressure: 46 mm Hg (S).     Cardiac catheterization 11/07/2014 Conclusion     Bypass graft failure with total occlusion of the SVG to the RCA, total occlusion of the SVG to the diagonal, and thrombotic high-grade stenosis throughout the entirety of the sequential saphenous vein graft to the first and second obtuse marginal.  Widely patent left internal mammary graft to the LAD. Distal LAD supplies collaterals to the right coronary.  Severe native vessel coronary  disease with total occlusion of the mid LAD, severe diffuse disease in the first diagonal, 50-70% distal left main, and total occlusion of the proximal RCA.  Left ventriculography was not performed. Acute heart failure, denoted by the markedly elevated LVEDP (35-40 mmHg) is present.   RECOMMENDATIONS:   Closely monitor kidney function  IV diuretic therapy to treat acute heart failure  IV heparin, may help to maintain patency in the thrombosed sequential saphenous vein graft. My thought is that this graft was totally occluded resulting in marked enzyme release. On IV heparin the vessel has subsequently recanalized. We should also add Plavix to the patient's current medical regimen. Intervention on the sequential saphenous vein graft will require significant contrast. Final decision will be based upon clinical course, i.e., if recurrent angina/equivalent, may need to consider PCI.  Echo for LV assessment, with appropriate management dependent upon EF. This could include consideration of device therapy in the future if indicated.      Hospital Course  The patient is a 81 year old AA male with PMH of CAD with remote MI s/p PCI in 1990s followed by CABG 5 in 2003. He was previously admitted in July with unstable angina and also worsening renal function. A Myoview was obtained which came back high risk, however he refused to go on dialysis at the time and was discharged home on medical therapy. He presented [facility] on 9/79[facility] with acute MI. EKG showed inferior Q waves with suggestion of ST elevation. He agreed to undergo hemodialysis if needed. Echocardiogram obtained on arrival showed EF 40-45%, akinesis of inferior myocardium, mild MR, PA peak pressure 48 mmHg, no significant AS/AR on this echo despite last echo 2 months ago showed mild to moderately calcified aortic valve with either mild to moderate gradient. Since he agreed to be on dialysis, he underwent diagnostic  cardiac catheterization by Dr. Tamala Julian on 11/07/2014, he was  noted to have bypass graft failure with total occlusion of SVG to RCA, total occlusion of SVG to diagonal, thrombotic high-grade stenosis throughout the entirety of sequential SVG to OM1/OM 2, patent LIMA to LAD with distal LAD collateral to RCA, severely elevated LVEDP 35-40 mmHg. Per Dr. Tamala Julian, it is felt that sequential SVG to OM1/OM 2 was completely occluded resulting in elevated enzyme, subsequently recanalized on IV heparin. We could potentially consider staged PCI if he continued to have angina.  He was started on IV Lasix for severely elevated LVEDP with moderate urine output. Nephrology was consulted during this admission to help manage chronic kidney disease stage IV post cath. He was seen in the morning of 11/12/2014, at which time he denies any significant chest discomfort or shortness of breath. He appears to be euvolemic on physical exam. He has been transitioned to oral Lasix 40 mg twice a day. I have discussed with Dr. Posey Pronto with nephrology service, patient's renal function appears to be stable at this time. He recommended the patient to obtained BMET as outpatient on Friday. I've also arranged close outpatient cardiac follow-up. Cardiology nephrology appt arranged. We will continue medical therapy at this time, however if he has recurrent chest discomfort later, will consider PCI. Of note, prior to discharge, he walked with cardiac rehabilitation without significant chest discomfort although he did have slight shortness of breath after walking. Given his high risk of unstable angina later, I have advised patient to seek medical attention if he has recurrent chest discomfort not alleviated with sublingual nitroglycerin 3.   Of note, significant medication changes during this admission included addition of Plavix, Lasix has been increased from previous 40 mg daily to 40 mg twice a day. His Imdur has been increased from previous 60 mg to 90  mg daily. His home Zocor has been changed to high-dose Lipitor. He is home labetalol has been changed to metoprolol 100 mg twice a day. I have sent Rx to both his [employer] and printed 30 day supplies for him.   Discharge Vitals Blood pressure 124/61, pulse 72, temperature 98.8 F (37.1 C), temperature source Oral, resp. rate 16, height 6' (1.829 m), weight 170 lb 3.2 oz (77.202 kg), SpO2 95 %.  Filed Weights   11/10/14 0527 11/11/14 0500 11/12/14 0535  Weight: 173 lb 1.6 oz (78.518 kg) 172 lb 9.6 oz (78.291 kg) 170 lb 3.2 oz (77.202 kg)    Labs  CBC  Recent Labs  11/10/14 0405  WBC 8.4  HGB 10.3*  HCT 30.5*  MCV 87.4  PLT 497   Basic Metabolic Panel  Recent Labs  11/11/14 1055 11/12/14 0355  NA 135 135  K 4.0 4.0  CL 102 100*  CO2 20* 21*  GLUCOSE 134* 147*  BUN 82* 85*  CREATININE 4.13* 4.25*  CALCIUM 9.4 9.4  PHOS 4.5 5.5*   Liver Function Tests  Recent Labs  11/11/14 1055 11/12/14 0355  ALBUMIN 3.3* 3.2*    Disposition  Pt is being discharged home today in good condition.  Follow-up Plans & Appointments      Follow-up Information    Follow up with Rexene Agent, MD On 12/02/2014.   Specialty:  Nephrology   Why:  Appointment at 12 PM   Contact information:   [place] [id number] [phone]       Follow up with [facility] On 11/14/2014.   Specialty:  Cardiology   Why:  Obtain BMET on Friday  and have result sent to Dr. Tamala Julian and Dr. Dossie Arbour information:   [address], [place] [address] [phone]      Follow up with [name], MD On 12/01/2014.   Specialty:  Cardiology   Why:  8:15am   Contact information:   [address] [place] [address] [phone]       Discharge Medications    Medication List    STOP taking these medications        cloNIDine 0.1 MG tablet  Commonly known as:  CATAPRES      labetalol 200 MG tablet  Commonly known as:  NORMODYNE     simvastatin 20 MG tablet  Commonly known as:  ZOCOR      TAKE these medications        ACCU-CHEK AVIVA PLUS test strip  Generic drug:  glucose blood  USE TWICE DAILY AS NEEDED     ACCU-CHEK AVIVA PLUS W/DEVICE Kit  USE AS DIRECTED     ACCU-CHEK AVIVA Soln  USE AS DIRECTED     ACCU-CHEK SOFTCLIX LANCETS lancets  1 each by Other route 2 (two) times daily as needed for other.     acetaminophen 500 MG tablet  Commonly known as:  TYLENOL  Take 500 mg by mouth daily as needed for mild pain or headache.     amLODipine 10 MG tablet  Commonly known as:  NORVASC  TAKE 1 TABLET DAILY     aspirin EC 81 MG tablet  Take 81 mg by mouth daily.     atorvastatin 80 MG tablet  Commonly known as:  LIPITOR  Take 1 tablet (80 mg total) by mouth daily at 6 PM.     B-D SINGLE USE SWABS REGULAR Pads  USE AS DIRECTED     clopidogrel 75 MG tablet  Commonly known as:  PLAVIX  Take 1 tablet (75 mg total) by mouth daily.     furosemide 40 MG tablet  Commonly known as:  LASIX  Take 1 tablet (40 mg total) by mouth 2 (two) times daily.     insulin aspart 100 UNIT/ML FlexPen  Commonly known as:  NOVOLOG FLEXPEN  INJECT 14 UNITS INTO THE SKIN THREE TIMES DAILY WITH MEALS.     Insulin Glargine 100 UNIT/ML Solostar Pen  Commonly known as:  LANTUS  Inject 20 Units into the skin daily at 10 pm.     Insulin Pen Needle 32G X 4 MM Misc  Commonly known as:  RELION PEN NEEDLES  1 each by Does not apply route 2 (two) times daily as needed.     isosorbide mononitrate 30 MG 24 hr tablet  Commonly known as:  IMDUR  Take 3 tablets (90 mg total) by mouth daily.     latanoprost 0.005 % ophthalmic solution  Commonly known as:  XALATAN  Place 1 drop into both eyes at bedtime.     metoprolol 100 MG tablet  Commonly known as:  LOPRESSOR  Take 1 tablet (100 mg total) by mouth 2 (two) times daily.     nitroGLYCERIN 0.4 MG SL tablet  Commonly  known as:  NITROSTAT  Place 1 tablet (0.4 mg total) under the tongue every 5 (five) minutes x 3 doses as needed for chest pain.     pentoxifylline 400 MG CR tablet  Commonly known as:  TRENTAL  TAKE 1 TABLET (400 MG TOTAL)  BY MOUTH 2 (TWO) TIMES DAILY  AT 10 AMAND  5 PM.     SYRINGE-NEEDLE (DISP) 3 ML 25G X 1" 3 ML Misc  1 each by Does not apply route every 14 (fourteen) days.     timolol 0.25 % ophthalmic solution  Commonly known as:  TIMOPTIC  Place 1 drop into both eyes daily.        Outstanding Labs/Studies  BMET this Friday to monitor renal function  Duration of Discharge Encounter   Greater than 30 minutes including physician time.  Hilbert Corrigan PA-C Pager: [phone] 11/12/2014, 12:55 PM

## 2014-11-12 NOTE — Progress Notes (Signed)
Patient ID: [name], male   DOB: 09/21/1931, 81 y.o.   MRN: [id number]  [facility] Progress Note   Assessment/ Plan:   1. Renal failure on chronic kidney disease stage IV- advanced CKD at baseline. Excellent UOP overnight a slight rise of creatinine noted on labs today-fortunately, without any acute dialysis needs or critical electrolytes to prompt intervention. His creatinine appears to be slightly higher than yesterday but essentially GFR is unchanged. I would recommend discharging him on the current dose of diuretic (furosemide 40 mg twice a day) and following him up with labs in 2 days-Friday as an outpatient as there is nothing different that we'll do in the hospital at this time . Clinically, he appears to be stable and without any uremic symptoms. 2. CV- NSTEMI with high risk myoview-cath found total SVG to RCA occlusion with other areas of stenosis- medical therapy maximized. 3. Anemia- hemoglobin appears to be drifting down without any overt losses-continue to monitor for iron/ESA needs 4. HTN/vol- seems well controlled on amlodipine and labetalol - furosemide dose now 40 mg twice a day.  Subjective:   Denies any chest pain or shortness of breath-ambulated around hallways without significant problems yesterday    Objective:   BP 131/69 mmHg  Pulse 81  Temp(Src) 98.8 F (37.1 C) (Oral)  Resp 16  Ht 6' (1.829 m)  Wt 77.202 kg (170 lb 3.2 oz)  BMI 23.08 kg/m2  SpO2 95%  Intake/Output Summary (Last 24 hours) at 11/12/14 1005 Last data filed at 11/12/14 0800  Gross per 24 hour  Intake    723 ml  Output   1525 ml  Net   -802 ml   Weight change: -1.089 kg (-2 lb 6.4 oz)  Physical Exam: Gen: Comfortably resting in recliner CVS: Pulse regular in rate and rhythm, S1 and S2 normal Resp: Clear to auscultation, no rales/rhonchi Abd: Soft, flat, nontender Ext: No edema over lower extremities  Imaging: No results found.  Labs: BMET  Recent Labs Lab  11/06/14 1458 11/07/14 0358 11/08/14 0255 11/09/14 0328 11/10/14 0405 11/11/14 1055 11/12/14 0355  NA 137 134* 133* 132* 133* 135 135  K 5.0 4.2 4.3 4.0 3.9 4.0 4.0  CL 101 102 102 102 102 102 100*  CO2 25 21* 18* 18* 18* 20* 21*  GLUCOSE 154* 156* 138* 147* 142* 134* 147*  BUN 39* 44* 48* 65* 76* 82* 85*  CREATININE 3.54* 3.28* 3.48* 3.97* 4.16* 4.13* 4.25*  CALCIUM 10.1 9.1 8.7* 8.8* 8.8* 9.4 9.4  PHOS  --   --  4.4  --   --  4.5 5.5*   CBC  Recent Labs Lab 11/06/14 1458 11/07/14 0358 11/08/14 0255 11/09/14 0328 11/10/14 0405  WBC 16.4* 15.6* 12.4* 10.7* 8.4  NEUTROABS 14.0*  --   --   --   --   HGB 11.7* 12.3* 10.7* 11.6* 10.3*  HCT 35.1* 36.3* 32.1* 34.1* 30.5*  MCV 89.1 88.3 88.4 88.1 87.4  PLT 230 201 216 225 254    Medications:    . amLODipine  10 mg Oral Daily  . aspirin EC  81 mg Oral Daily  . atorvastatin  80 mg Oral q1800  . clopidogrel  75 mg Oral Daily  . furosemide  40 mg Oral BID  . insulin aspart  0-15 Units Subcutaneous TID WC  . insulin aspart  0-5 Units Subcutaneous QHS  . isosorbide mononitrate  90 mg Oral Daily  . latanoprost  1 drop Both Eyes QHS  .  metoprolol tartrate  100 mg Oral BID  . senna  1 tablet Oral BID  . sodium chloride  3 mL Intravenous Q12H  . sodium chloride  3 mL Intravenous Q12H  . timolol  1 drop Both Eyes Daily   Elmarie Shiley, MD 11/12/2014, 10:05 AM

## 2014-11-12 NOTE — Discharge Instructions (Signed)

## 2014-11-12 NOTE — Progress Notes (Signed)
Patient Name: [name] Date of Encounter: 11/12/2014  Primary Cardiologist: Dr Tamala Julian   Principal Problem:   Myocardial infarct-out of hospital Active Problems:   Diabetes mellitus with renal complications   Dyslipidemia   Essential hypertension   Coronary atherosclerosis, CABG in 2003   Peptic ulcer disease- 2013   Chronic renal disease, stage IV-pt declines dialysis   Elevated troponin   NSTEMI (non-ST elevated myocardial infarction)    SUBJECTIVE  Standing near the sink shaving. Denies any CP or SOB. Feeling well.   CURRENT MEDS . amLODipine  10 mg Oral Daily  . aspirin EC  81 mg Oral Daily  . atorvastatin  80 mg Oral q1800  . clopidogrel  75 mg Oral Daily  . furosemide  40 mg Oral BID  . insulin aspart  0-15 Units Subcutaneous TID WC  . insulin aspart  0-5 Units Subcutaneous QHS  . isosorbide mononitrate  90 mg Oral Daily  . latanoprost  1 drop Both Eyes QHS  . metoprolol tartrate  100 mg Oral BID  . senna  1 tablet Oral BID  . sodium chloride  3 mL Intravenous Q12H  . sodium chloride  3 mL Intravenous Q12H  . timolol  1 drop Both Eyes Daily    OBJECTIVE  Filed Vitals:   11/11/14 1031 11/11/14 1734 11/11/14 2047 11/12/14 0535  BP: 143/118 126/54 123/61 131/69  Pulse:  72 74 81  Temp:  97.7 F (36.5 C) 98.2 F (36.8 C) 98.8 F (37.1 C)  TempSrc:  Oral Oral Oral  Resp:  18  16  Height:      Weight:    170 lb 3.2 oz (77.202 kg)  SpO2:  98% 98% 95%    Intake/Output Summary (Last 24 hours) at 11/12/14 0810 Last data filed at 11/12/14 0535  Gross per 24 hour  Intake    926 ml  Output   1325 ml  Net   -399 ml   Filed Weights   11/10/14 0527 11/11/14 0500 11/12/14 0535  Weight: 173 lb 1.6 oz (78.518 kg) 172 lb 9.6 oz (78.291 kg) 170 lb 3.2 oz (77.202 kg)    PHYSICAL EXAM  General: Pleasant, NAD. Neuro: Alert and oriented X 3. Moves all extremities spontaneously. Psych: Normal affect. HEENT:  Normal  Neck: Supple without bruits or  JVD. Lungs:  Resp regular and unlabored, CTA. Heart: RRR no s3, s4, or murmurs. Abdomen: Soft, non-tender, non-distended, BS + x 4.  Extremities: No clubbing, cyanosis or edema. DP/PT/Radials 2+ and equal bilaterally.  Accessory Clinical Findings  CBC  Recent Labs  11/10/14 0405  WBC 8.4  HGB 10.3*  HCT 30.5*  MCV 87.4  PLT 0000000   Basic Metabolic Panel  Recent Labs  11/11/14 1055 11/12/14 0355  NA 135 135  K 4.0 4.0  CL 102 100*  CO2 20* 21*  GLUCOSE 134* 147*  BUN 82* 85*  CREATININE 4.13* 4.25*  CALCIUM 9.4 9.4  PHOS 4.5 5.5*   Liver Function Tests  Recent Labs  11/11/14 1055 11/12/14 0355  ALBUMIN 3.3* 3.2*   TELE NSR with HR 70-80s    ECG  No new EKG  Echocardiogram 11/07/2014  LV EF: 40% -  45%  ------------------------------------------------------------------- Indications:   Chest pain 786.51.  ------------------------------------------------------------------- History:  PMH:  Coronary artery disease. PMH:  Myocardial infarction. Risk factors: Hypertension. Dyslipidemia.  ------------------------------------------------------------------- Study Conclusions  - Left ventricle: The cavity size was normal. Wall thickness was normal. Systolic function was mildly to  moderately reduced. The estimated ejection fraction was in the range of 40% to 45%. Akinesis of the inferior myocardium. - Mitral valve: There was mild regurgitation. - Pulmonary arteries: Systolic pressure was moderately increased. PA peak pressure: 46 mm Hg (S).    Radiology/Studies  Dg Chest Port 1 View  11/06/2014   CLINICAL DATA:  Midsternal chest pain began last night.  EXAM: PORTABLE CHEST - 1 VIEW  COMPARISON:  None.  FINDINGS: There is no focal parenchymal opacity. There is no pleural effusion or pneumothorax. The heart and mediastinal contours are unremarkable. There is evidence of prior CABG.  The osseous structures are unremarkable.  IMPRESSION: No  active disease.   Electronically Signed   By: Kathreen Devoid   On: 11/06/2014 15:40    ASSESSMENT AND PLAN  Pleasant 81 y/o AA male followed by Dr Tamala Julian with a history of CAD, h/o remote MI PCI in the 90's followed by CABG x 5 in 2003. He was admitted in July with [place] noted to have worsening renal function with acute on CKD stage 3/4, he had high risk myobiew, but he would not go on dialysis and discharged home on medical therapy. He presented back on 11/06/2014 with acute MI. EKG showed inferior Q wave with suggestion of ST elevation. This time he agreed to undergo HD if needed, planned for cath on 11/06/2013.   1. Acute MI with recent high risk myoview - Echo 09/11/2014 EF 55-60%, mildly to moderately calcified AV with either mild to moderate gradient (recommeneded CTA and TEE if clinically indicated) - Echo 11/07/2014 EF 40-45%, akinesis of inferior myocardium, mild MR, PA peak pressure 63mmhg, no AS or AR on this echo - cath 11/07/2014 bypass graft failure with total occlusion of SVG to RCA, total occlusion of SVG to diagonal, thrombotic high grade stenosis throughout entirety of the sequential SVG to OM1/OM2, patent LIMA to LAD with distal LAD collaterals to RCA, LVEDP 35-15mmHg - per Dr. Tamala Julian, it is felt that sequential SVG to OM1/OM2 was completed occluded resulting in elevated enzyme, subsequently recanalized on IV heparin. Final decision of PCI to sequential OM will based upon clinical course, if recurrent angina, may need to consider PCI. Lasix for severely elevated LVEDP. Plavix started.  - continue ASA, plavix, Imdur, lipitor, amlodipine, change metoprolol from 50mg  QID to 100mg  BID.   2. Acute on chronic renal insufficiency: stage IV - s/p contrast dye on 11/07/2014. LVEDP 35-48mmHg on cath 9/9. Cr on arrival 3.28 on cath, now 4.25 this morning.. Likely combination of lasix and contrast. Lasix cut  back - nurse to check O2 need during ambulation today, likely can wean off [place]  - unfortunately Cr continue to trend up, euvolemic on exam. Patient is stable from cardiac perspective for discharge at anytime, however renal function is the concern, will followup with nephrology recommendation.  Addendum 10AM: discussed with Dr. Posey Pronto of Nephrology, cleared for discharge. BMET this Friday. Nephrology will arrange followup.  3. CAD s/p CABG x5 in 2003  4. HTN 5. HLD  Signed, Woodward Ku Pager: [phone]  The patient was seen, examined and discussed with Almyra Deforest, PA-C and I agree with the above.   81 year old male with h/o cath 11/07/2014 bypass graft failure with total occlusion of SVG to RCA, total occlusion of SVG to diagonal, thrombotic high grade stenosis throughout entirety of the sequential SVG to OM1/OM2, patent LIMA to LAD with distal LAD collaterals to RCA, LVEDP 35-55mmHg. Currently off iv heparin, feeling well, per  Dr Tamala Julian, cath only if significant symptoms as he would most probably end of HD that he doesn't want to have.  Continue Plavix, ASA, plavix, Imdur, lipitor, amlodipine, metoprolol, BP controlled after increasing imdur.  We will switch lasix to PO 40 mg BID (prior to admission 40 mg po daily) for severely elevated LVEDP on cardiac cath.  Dorothy Spark 11/12/2014

## 2014-11-12 NOTE — Progress Notes (Signed)
CARDIAC REHAB PHASE I   PRE:  Rate/Rhythm: 77 SR    BP: standing 133/64    SaO2:   MODE:  Ambulation: 550 ft   POST:  Rate/Rhythm: 94 SR    BP: sitting 133/69     SaO2:   Pt ambulated without problems today, much stronger than yesterday. He sts he walked 600 ft x2 yesterday after I saw him. He did have slight SOB after walking today. Reviewed ed with teach back however pt needs RD c/s for a renal diet as I do not feel heart healthy diet is sufficient for him. Discussed with RN.  [phone]  Josephina Shih Helen CES, ACSM 11/12/2014 9:14 AM

## 2014-11-14 ENCOUNTER — Telehealth: Payer: Self-pay

## 2014-11-14 ENCOUNTER — Ambulatory Visit
Admit: 2014-11-14 | Discharge: 2014-11-14 | Disposition: A | Discharge status: Home / Self Care | Payer: Commercial Managed Care - HMO | Attending: Nephrology | Admitting: Nephrology

## 2014-11-14 ENCOUNTER — Other Ambulatory Visit (INDEPENDENT_AMBULATORY_CARE_PROVIDER_SITE_OTHER): Payer: Commercial Managed Care - HMO | Admitting: *Deleted

## 2014-11-14 DIAGNOSIS — N184 Chronic kidney disease, stage 4 (severe): Secondary | ICD-10-CM

## 2014-11-14 DIAGNOSIS — I10 Essential (primary) hypertension: Secondary | ICD-10-CM

## 2014-11-14 DIAGNOSIS — N189 Chronic kidney disease, unspecified: Secondary | ICD-10-CM | POA: Diagnosis not present

## 2014-11-14 LAB — BASIC METABOLIC PANEL
BUN: 97 mg/dL (ref 6–23)
CALCIUM: 9.2 mg/dL (ref 8.4–10.5)
CO2: 24 meq/L (ref 19–32)
CREATININE: 4.5 mg/dL — AB (ref 0.40–1.50)
Chloride: 101 mEq/L (ref 96–112)
GFR: 16.24 mL/min — AB (ref >60.00)
Glucose, Bld: 120 mg/dL — ABNORMAL HIGH (ref 70–99)
Potassium: 4.4 mEq/L (ref 3.5–5.1)
SODIUM: 136 meq/L (ref 135–145)

## 2014-11-14 NOTE — Telephone Encounter (Signed)
Critical Creatinine of 4.50 and Bun 97 called in to Triage C.[place], LPN received the message. Dr.Smith aware of results. Per Dr.Smith if pt is asymptomatic no changed needed pt should f/u with his nephrologist asap.  Called and spoke with pt. Pt sts that he is doing well, and is asymptomatic. Pt sts that he has an upcoming appt with Dr.Simpson @ [facility]. Adv pt to keep f/u appt with Dr.Smith on 12/01/14. I will fwd Dr.Smith an update and call back if he has any further recommendation Pt agreeable with plan and verbalized understanding.

## 2014-11-14 NOTE — Addendum Note (Signed)
Addended by: Eulis Foster on: 11/14/2014 11:08 AM   Modules accepted: Orders

## 2014-11-14 NOTE — Telephone Encounter (Signed)
HOPE  FROM LAB  CALLED  WITH CRITICAL  BUN OF  97 AND  CR  OF  4.50  SPOKE WITH  LISA  DR SMITHS  ASSIST   AFTER  REVIEW  LISA TO CALL  PT  AND  CHECK ON SYMPTOMS  AS WELL  AS  MAKE  SURE PT   SEES  NEPHROLOGIST AT  DR SMITH'S REQUEST .Adonis Housekeeper

## 2014-11-16 NOTE — Telephone Encounter (Signed)
Repeat basic metabolic panel on Tuesday

## 2014-11-17 NOTE — Telephone Encounter (Signed)
Called to give pt Dr.Smith's recommendation. Repeat basic metabolic panel on Tuesday. 9/20. lmtcb

## 2014-11-18 NOTE — Telephone Encounter (Signed)
Called pt x2 to give pt Dr.Smith's recommendation. Pt needs repeat bmet today. lmtcb

## 2014-11-20 ENCOUNTER — Telehealth: Payer: Self-pay | Admitting: Internal Medicine

## 2014-11-20 NOTE — Telephone Encounter (Signed)
lmovm to call office and schedule an appt with Dr Raliegh Ip

## 2014-11-20 NOTE — Telephone Encounter (Signed)
[name] said pt is refusing dialysis and would like for him to fup sooner than 12/31/14 with Dr Raliegh Ip

## 2014-11-20 NOTE — Telephone Encounter (Signed)
[name] care management call to say that pt was in [place] and he has been giving himself insulin without checking his blood sugar. She is requesting a fup reguarding safety and teaching.

## 2014-11-20 NOTE — Telephone Encounter (Signed)
Pt has been sch for Monday 11-27-14

## 2014-11-20 NOTE — Telephone Encounter (Signed)
Please call pt and schedule appt next week tell him Dr.K needs to see him to discuss Diabetes.

## 2014-11-20 NOTE — Telephone Encounter (Signed)
3rd attempt to reach pt. lmtcb on both phone #'s listed

## 2014-11-21 ENCOUNTER — Other Ambulatory Visit (INDEPENDENT_AMBULATORY_CARE_PROVIDER_SITE_OTHER): Payer: Commercial Managed Care - HMO | Admitting: *Deleted

## 2014-11-21 DIAGNOSIS — N184 Chronic kidney disease, stage 4 (severe): Secondary | ICD-10-CM | POA: Diagnosis not present

## 2014-11-21 LAB — BASIC METABOLIC PANEL
BUN: 65 mg/dL — AB (ref 6–23)
CALCIUM: 9.9 mg/dL (ref 8.4–10.5)
CO2: 26 meq/L (ref 19–32)
Chloride: 102 mEq/L (ref 96–112)
Creatinine, Ser: 3.5 mg/dL — ABNORMAL HIGH (ref 0.40–1.50)
GFR: 21.71 mL/min — AB (ref >60.00)
GLUCOSE: 164 mg/dL — AB (ref 70–99)
POTASSIUM: 4.7 meq/L (ref 3.5–5.1)
Sodium: 137 mEq/L (ref 135–145)

## 2014-11-21 NOTE — Patient Outreach (Signed)
[facility]) Care Management  11/21/2014  [name] [birth year] [id number]   Referral from [place], assigned Quinn Plowman, RN to outreach.  Thanks, Ronnell Freshwater. [place], [place] Assistant Phone: [phone] Fax: [phone]

## 2014-11-21 NOTE — Telephone Encounter (Signed)
Spoke with pt who sts that he has been out of town and returned yesterday, Adv pt that [place] would like for him to have repeat lab (bmet) work. Pt agreeable. Pt will come to the office this afternoon for lab work. Lab appt scheduled for today

## 2014-11-21 NOTE — Telephone Encounter (Signed)
ERROR

## 2014-11-24 ENCOUNTER — Ambulatory Visit: Payer: Commercial Managed Care - HMO | Admitting: Internal Medicine

## 2014-11-24 ENCOUNTER — Ambulatory Visit (INDEPENDENT_AMBULATORY_CARE_PROVIDER_SITE_OTHER): Payer: Commercial Managed Care - HMO | Admitting: Internal Medicine

## 2014-11-24 ENCOUNTER — Encounter: Payer: Self-pay | Admitting: Internal Medicine

## 2014-11-24 ENCOUNTER — Other Ambulatory Visit: Payer: Commercial Managed Care - HMO

## 2014-11-24 VITALS — BP 122/70 | HR 79 | Temp 98.5°F | Resp 20 | Ht 72.0 in | Wt 166.0 lb

## 2014-11-24 DIAGNOSIS — I252 Old myocardial infarction: Secondary | ICD-10-CM | POA: Diagnosis not present

## 2014-11-24 DIAGNOSIS — E0821 Diabetes mellitus due to underlying condition with diabetic nephropathy: Secondary | ICD-10-CM

## 2014-11-24 DIAGNOSIS — I10 Essential (primary) hypertension: Secondary | ICD-10-CM

## 2014-11-24 DIAGNOSIS — I214 Non-ST elevation (NSTEMI) myocardial infarction: Secondary | ICD-10-CM

## 2014-11-24 DIAGNOSIS — G4700 Insomnia, unspecified: Secondary | ICD-10-CM

## 2014-11-24 NOTE — Progress Notes (Signed)
 Subjective:    Patient ID: [name], male    DOB: 06/01/1931, 81 y.o.   MRN: [id number]  HPI  Lab Results  Component Value Date   HGBA1C 6.8* 10/01/2014   Admit date: 11/06/2014 Discharge date: 11/12/2014  Primary Care Provider: KWIATKOWSKI,PETER FRANK Primary Cardiologist: Dr. Smith  Discharge Diagnoses Principal Problem:  Myocardial infarct-out of hospital Active Problems:  Diabetes mellitus with renal complications  Dyslipidemia  Essential hypertension  Coronary atherosclerosis, CABG in 2003  Peptic ulcer disease- 2013  Chronic renal disease, stage IV-pt declines dialysis  Elevated troponin  NSTEMI (non-ST elevated myocardial infarction)  81-year-old patient who is seen today following a recent hospital discharge.  He was admitted with a out of hospital non-ST elevated MI.  He underwent heart catheterization that revealed extensive disease in both grafts and native arteries.  Since his hospital discharge, he has done well without recurrent chest pain and no nitroglycerin use.  He is slightly weak but denies any shortness of breath.  3 days ago.  Creatinine improved from 4.5 to 3.5.  He is followed by renal medicine and is scheduled for cardiology follow-up  His only complaint today is some insomnia.  Trazodone has not been effective  Past Medical History  Diagnosis Date  . CAD (coronary artery disease)   . Hyperlipidemia   . Hypertension   . Myocardial infarction 1998  . Heart murmur   . Type II diabetes mellitus   . History of stomach ulcers   . Arthritis     "touch in my fingers" (09/10/2014)  . Renal insufficiency   . Acute renal failure     /notes 09/10/2014  . B12 deficiency anemia     "stopped taking the shots in ~ 06/2014" (09/10/2014)  . Diabetes 1.5, managed as type 1     Social History   Social History  . Marital Status: Widowed    Spouse Name: N/A  . Number of Children: N/A  . Years of Education: N/A   Occupational History  .  Not on file.   Social History Main Topics  . Smoking status: Former Smoker -- 0.50 packs/day for 20 years    Types: Cigarettes  . Smokeless tobacco: Never Used     Comment: "quit smoking cigarettes in the 1970's  . Alcohol Use: No  . Drug Use: No  . Sexual Activity: Not Currently   Other Topics Concern  . Not on file   Social History Narrative    Past Surgical History  Procedure Laterality Date  . Esophagogastroduodenoscopy  06/10/2011    Procedure: ESOPHAGOGASTRODUODENOSCOPY (EGD);  Surgeon: Malcolm T Stark, MD,FACG;  Location: [facility] ENDOSCOPY;  Service: Endoscopy;  Laterality: N/A;  . Coronary artery bypass graft  2003    CABG X5  . Cardiac catheterization  2003;   . Coronary angioplasty  1998    /notes 07/13/2010  . Cataract extraction, bilateral Bilateral   . Cardiac catheterization N/A 11/07/2014    Procedure: Left Heart Cath and Coronary Angiography;  Surgeon: Henry W Smith, MD;  Location: [facility] INVASIVE CV LAB;  Service: Cardiovascular;  Laterality: N/A;    Family History  Problem Relation Age of Onset  . Diabetes      brothers and sisters  . Hypertension Sister   . Coronary artery disease    . Stroke Mother   . Hypertension Mother   . Cancer Mother   . Heart attack Sister     No Known Allergies  Current Outpatient Prescriptions on File Prior   to Visit  Medication Sig Dispense Refill  . ACCU-CHEK AVIVA PLUS test strip USE TWICE DAILY AS NEEDED 100 each 12  . ACCU-CHEK SOFTCLIX LANCETS lancets 1 each by Other route 2 (two) times daily as needed for other. 200 each 12  . acetaminophen (TYLENOL) 500 MG tablet Take 500 mg by mouth daily as needed for mild pain or headache.    . Alcohol Swabs (B-D SINGLE USE SWABS REGULAR) PADS USE AS DIRECTED 100 each 12  . amLODipine (NORVASC) 10 MG tablet TAKE 1 TABLET DAILY 90 tablet 3  . aspirin EC 81 MG tablet Take 81 mg by mouth daily.    . atorvastatin (LIPITOR) 80 MG tablet Take 1 tablet (80 mg total) by mouth daily at 6 PM. 30  tablet 0  . Blood Glucose Calibration (ACCU-CHEK AVIVA) SOLN USE AS DIRECTED 3 each 3  . Blood Glucose Monitoring Suppl (ACCU-CHEK AVIVA PLUS) W/DEVICE KIT USE AS DIRECTED 1 kit 0  . clopidogrel (PLAVIX) 75 MG tablet Take 1 tablet (75 mg total) by mouth daily. 30 tablet 0  . furosemide (LASIX) 40 MG tablet Take 1 tablet (40 mg total) by mouth 2 (two) times daily. 60 tablet 0  . insulin aspart (NOVOLOG FLEXPEN) 100 UNIT/ML FlexPen INJECT 14 UNITS INTO THE SKIN THREE TIMES DAILY WITH MEALS. 15 pen 3  . Insulin Glargine (LANTUS) 100 UNIT/ML Solostar Pen Inject 20 Units into the skin daily at 10 pm. 5 pen 3  . Insulin Pen Needle (RELION PEN NEEDLES) 32G X 4 MM MISC 1 each by Does not apply route 2 (two) times daily as needed. 200 each 4  . isosorbide mononitrate (IMDUR) 30 MG 24 hr tablet Take 3 tablets (90 mg total) by mouth daily. 90 tablet 0  . latanoprost (XALATAN) 0.005 % ophthalmic solution Place 1 drop into both eyes at bedtime.     . metoprolol (LOPRESSOR) 100 MG tablet Take 1 tablet (100 mg total) by mouth 2 (two) times daily. 60 tablet 0  . nitroGLYCERIN (NITROSTAT) 0.4 MG SL tablet Place 1 tablet (0.4 mg total) under the tongue every 5 (five) minutes x 3 doses as needed for chest pain. 25 tablet 2  . pentoxifylline (TRENTAL) 400 MG CR tablet TAKE 1 TABLET (400 MG TOTAL)  BY MOUTH 2 (TWO) TIMES DAILY AT 10 AMAND  5 PM. 180 tablet 3  . SYRINGE-NEEDLE, DISP, 3 ML 25G X 1" 3 ML MISC 1 each by Does not apply route every 14 (fourteen) days. 50 each 3  . timolol (TIMOPTIC) 0.25 % ophthalmic solution Place 1 drop into both eyes daily.      No current facility-administered medications on file prior to visit.    BP 122/70 mmHg  Pulse 79  Temp(Src) 98.5 F (36.9 C) (Oral)  Resp 20  Ht 6' (1.829 m)  Wt 166 lb (75.297 kg)  BMI 22.51 kg/m2  SpO2 97%    Review of Systems  Constitutional: Positive for activity change and fatigue. Negative for fever, chills and appetite change.  HENT: Negative  for congestion, dental problem, ear pain, hearing loss, sore throat, tinnitus, trouble swallowing and voice change.   Eyes: Negative for pain, discharge and visual disturbance.  Respiratory: Negative for cough, chest tightness, wheezing and stridor.   Cardiovascular: Negative for chest pain, palpitations and leg swelling.  Gastrointestinal: Negative for nausea, vomiting, abdominal pain, diarrhea, constipation, blood in stool and abdominal distention.  Genitourinary: Negative for urgency, hematuria, flank pain, discharge, difficulty urinating and genital sores.    Musculoskeletal: Negative for myalgias, back pain, joint swelling, arthralgias, gait problem and neck stiffness.  Skin: Negative for rash.  Neurological: Negative for dizziness, syncope, speech difficulty, weakness, numbness and headaches.  Hematological: Negative for adenopathy. Does not bruise/bleed easily.  Psychiatric/Behavioral: Positive for sleep disturbance. Negative for behavioral problems and dysphoric mood. The patient is not nervous/anxious.        Objective:   Physical Exam  Constitutional: He is oriented to person, place, and time. He appears well-developed.  HENT:  Head: Normocephalic.  Right Ear: External ear normal.  Left Ear: External ear normal.  Eyes: Conjunctivae and EOM are normal.  Neck: Normal range of motion.  Cardiovascular: Normal rate and normal heart sounds.   Pulmonary/Chest: Breath sounds normal. No respiratory distress. He has no wheezes. He has no rales.  Status post sternotomy  Abdominal: Bowel sounds are normal.  Musculoskeletal: Normal range of motion. He exhibits no edema or tenderness.  Neurological: He is alert and oriented to person, place, and time.  Psychiatric: He has a normal mood and affect. His behavior is normal.          Assessment & Plan:   Status post recent non-ST segment elevated MI Extensive coronary artery disease status post CABG 2003  acute on chronic renal  insufficiency PAD Hypertension Diabetes mellitus stable  Follow-up renal medicine and cardiology  Insomnia.  Trial melatonin  Return in 2 months for follow-up 

## 2014-11-24 NOTE — Progress Notes (Signed)
Pre visit review using our clinic review tool, if applicable. No additional management support is needed unless otherwise documented below in the visit note. 

## 2014-11-24 NOTE — Patient Instructions (Signed)
Limit your sodium (Salt) intake    It is important that you exercise regularly, at least 20 minutes 3 to 4 times per week.  If you develop chest pain or shortness of breath seek  medical attention.  Return in 2 months for follow-up  Report any chest pain or shortness of breath  Cardiology follow-up as scheduled  Trial melatonin 0.5-1 mg at bedtime

## 2014-11-25 ENCOUNTER — Telehealth: Payer: Self-pay

## 2014-11-25 NOTE — Telephone Encounter (Signed)
Pt aware of lab results.  Labs are better. Please record medication regimen patient is currently taking.  Pt sts that there has been no med changes. Medication list verbally reviewed with pt. Pt has an appt with Dr.Smith on 10/3 . Update fwd to Dr.Smith

## 2014-11-25 NOTE — Telephone Encounter (Signed)
-----   Message from Belva Crome, MD sent at 11/21/2014  5:10 PM EDT ----- Labs are better. Please record medication regimen patient is currently taking.

## 2014-12-01 ENCOUNTER — Other Ambulatory Visit: Payer: Self-pay

## 2014-12-01 ENCOUNTER — Ambulatory Visit (INDEPENDENT_AMBULATORY_CARE_PROVIDER_SITE_OTHER): Payer: Commercial Managed Care - HMO | Admitting: Interventional Cardiology

## 2014-12-01 ENCOUNTER — Encounter: Payer: Self-pay | Admitting: Interventional Cardiology

## 2014-12-01 VITALS — BP 130/50 | HR 60 | Ht 71.0 in | Wt 166.8 lb

## 2014-12-01 DIAGNOSIS — I5042 Chronic combined systolic (congestive) and diastolic (congestive) heart failure: Secondary | ICD-10-CM | POA: Diagnosis not present

## 2014-12-01 DIAGNOSIS — E785 Hyperlipidemia, unspecified: Secondary | ICD-10-CM

## 2014-12-01 DIAGNOSIS — I25810 Atherosclerosis of coronary artery bypass graft(s) without angina pectoris: Secondary | ICD-10-CM | POA: Diagnosis not present

## 2014-12-01 DIAGNOSIS — N184 Chronic kidney disease, stage 4 (severe): Secondary | ICD-10-CM

## 2014-12-01 DIAGNOSIS — I10 Essential (primary) hypertension: Secondary | ICD-10-CM

## 2014-12-01 NOTE — Patient Instructions (Signed)
Medication Instructions:  Your physician recommends that you continue on your current medications as directed. Please refer to the Current Medication list given to you today.   Labwork: Non ordered  Testing/Procedures: None ordered  Follow-Up: Your physician recommends that you schedule a follow-up appointment in: 3-4 months with Dr.Smith   Any Other Special Instructions Will Be Listed Below (If Applicable). Ok to resume exercise at the [facility]. Start slow over the next couple of weeks and build back up your exercise tolerance

## 2014-12-01 NOTE — Patient Outreach (Signed)
[facility]) Care Management  12/01/2014  [name] 1931-04-15 [id number]   Telephone call to patient regarding [place] referral.  Unable to reach patient.  HIPAA compliant voice message left with call back phone number.   PLAN;  RNCM will attempt 2nd telephone outreach to patient within 3 business days.   Quinn Plowman RN,BSN,CCM [place] Coordinator [phone]

## 2014-12-01 NOTE — Progress Notes (Signed)
Cardiology Office Note   Date:  12/01/2014   ID:  [name], DOB 01-20-1932, MRN [id number]  PCP:  [name], MD  Cardiologist:  [name], MD   Chief Complaint  Patient presents with  . Congestive Heart Failure      History of Present Illness: [name] is a 81 y.o. male who presents for coronary artery disease, coronary artery bypass grafting with failed vein graft but patent LIMA documented by cath September 2016, hypertension, chronic kidney disease stage IV, diabetes mellitus type 2 with complications, and hyperlipidemia.  Recent non-ST elevation myocardial infarction associated with acute heart failure. Further investigation demonstrated occlusion of all saphenous vein grafts. Additionally, stage IV/V CKD aggravated by contrast but improved on last laboratory data. He sees Dr. Joelyn Oms and most recent laboratory data is improved.  He has not used nitroglycerin, denies dyspnea, has not had syncope or lower extremity swelling since discharge from the hospital. No medication side effects.    Past Medical History  Diagnosis Date  . CAD (coronary artery disease)   . Hyperlipidemia   . Hypertension   . Myocardial infarction ([place]) 1998  . Heart murmur   . Type II diabetes mellitus ([place])   . History of stomach ulcers   . Arthritis     "touch in my fingers" (09/10/2014)  . Renal insufficiency   . Acute renal failure ([place])     [name] 09/10/2014  . B12 deficiency anemia     "stopped taking the shots in ~ 06/2014" (09/10/2014)  . Diabetes 1.5, managed as type 1 [facility])     Past Surgical History  Procedure Laterality Date  . Esophagogastroduodenoscopy  06/10/2011    Procedure: ESOPHAGOGASTRODUODENOSCOPY (EGD);  Surgeon: Ladene Artist, MD,FACG;  Location: [facility] ENDOSCOPY;  Service: Endoscopy;  Laterality: N/A;  . Coronary artery bypass graft  2003    CABG X5  . Cardiac catheterization  2003;   . Coronary angioplasty  1998    [name]  07/13/2010  . Cataract extraction, bilateral Bilateral   . Cardiac catheterization N/A 11/07/2014    Procedure: Left Heart Cath and Coronary Angiography;  Surgeon: Belva Crome, MD;  Location: [facility] CV LAB;  Service: Cardiovascular;  Laterality: N/A;     Current Outpatient Prescriptions  Medication Sig Dispense Refill  . ACCU-CHEK AVIVA PLUS test strip USE TWICE DAILY AS NEEDED 100 each 12  . ACCU-CHEK SOFTCLIX LANCETS lancets 1 each by Other route 2 (two) times daily as needed for other. 200 each 12  . acetaminophen (TYLENOL) 500 MG tablet Take 500 mg by mouth daily as needed for mild pain or headache.    . Alcohol Swabs (B-D SINGLE USE SWABS REGULAR) PADS USE AS DIRECTED 100 each 12  . amLODipine (NORVASC) 10 MG tablet TAKE 1 TABLET DAILY 90 tablet 3  . aspirin EC 81 MG tablet Take 81 mg by mouth daily.    Marland Kitchen atorvastatin (LIPITOR) 80 MG tablet Take 1 tablet (80 mg total) by mouth daily at 6 PM. 30 tablet 0  . Blood Glucose Calibration (ACCU-CHEK AVIVA) SOLN USE AS DIRECTED 3 each 3  . Blood Glucose Monitoring Suppl (ACCU-CHEK AVIVA PLUS) W/DEVICE KIT USE AS DIRECTED 1 kit 0  . cloNIDine (CATAPRES) 0.1 MG tablet Take 0.1 mg by mouth daily.    . clopidogrel (PLAVIX) 75 MG tablet Take 1 tablet (75 mg total) by mouth daily. 30 tablet 0  . furosemide (LASIX) 40 MG tablet Take 1 tablet (40 mg  total) by mouth 2 (two) times daily. 60 tablet 0  . insulin aspart (NOVOLOG FLEXPEN) 100 UNIT/ML FlexPen INJECT 14 UNITS INTO THE SKIN THREE TIMES DAILY WITH MEALS. 15 pen 3  . Insulin Glargine (LANTUS) 100 UNIT/ML Solostar Pen Inject 20 Units into the skin daily at 10 pm. 5 pen 3  . Insulin Pen Needle (RELION PEN NEEDLES) 32G X 4 MM MISC 1 each by Does not apply route 2 (two) times daily as needed. 200 each 4  . isosorbide mononitrate (IMDUR) 30 MG 24 hr tablet Take 3 tablets (90 mg total) by mouth daily. 90 tablet 0  . latanoprost (XALATAN) 0.005 % ophthalmic solution Place 1 drop into both eyes at  bedtime.     . metoprolol (LOPRESSOR) 100 MG tablet Take 1 tablet (100 mg total) by mouth 2 (two) times daily. 60 tablet 0  . nitroGLYCERIN (NITROSTAT) 0.4 MG SL tablet Place 0.4 mg under the tongue every 5 (five) minutes as needed for chest pain (3 doses MAX).    Marland Kitchen pentoxifylline (TRENTAL) 400 MG CR tablet TAKE 1 TABLET (400 MG TOTAL)  BY MOUTH 2 (TWO) TIMES DAILY AT 10 AMAND  5 PM. 180 tablet 3  . SYRINGE-NEEDLE, DISP, 3 ML 25G X 1" 3 ML MISC 1 each by Does not apply route every 14 (fourteen) days. 50 each 3  . timolol (TIMOPTIC) 0.25 % ophthalmic solution Place 1 drop into both eyes daily.      No current facility-administered medications for this visit.    Allergies:   Review of patient's allergies indicates no known allergies.    Social History:  The patient  reports that he has quit smoking. His smoking use included Cigarettes. He has a 10 pack-year smoking history. He has never used smokeless tobacco. He reports that he does not drink alcohol or use illicit drugs.   Family History:  The patient's family history includes Cancer in his mother; Coronary artery disease in an other family member; Diabetes in an other family member; Heart attack in his sister; Hypertension in his mother and sister; Stroke in his mother.    ROS:  Please see the history of present illness.   Otherwise, review of systems are positive for weight loss, anxiety, and some insomnia..   All other systems are reviewed and negative.    PHYSICAL EXAM: VS:  BP 130/50 mmHg  Pulse 60  Ht _0  (1.803 m)  Wt 75.66 kg (166 lb 12.8 oz)  BMI 23.27 kg/m2 , BMI Body mass index is 23.27 kg/(m^2). GEN: Well nourished, well developed, in no acute distress HEENT: normal Neck: no JVD, carotid bruits, or masses Cardiac: RRR.  There is no murmur, rub, or gallop. There is no edema. Respiratory:  clear to auscultation bilaterally, normal work of breathing. GI: soft, nontender, nondistended, + BS MS: no deformity or  atrophy Skin: warm and dry, no rash Neuro:  Strength and sensation are intact Psych: euthymic mood, full affect   EKG:  EKG is not ordered today.   Recent Labs: 03/28/2014: ALT 12 09/10/2014: Magnesium 2.1; TSH 3.608 11/10/2014: Hemoglobin 10.3*; Platelets 254 11/21/2014: BUN 65*; Creatinine, Ser 3.50*; Potassium 4.7; Sodium 137    Lipid Panel    Component Value Date/Time   CHOL 118 11/07/2014 0358   TRIG 129 11/07/2014 0358   HDL 37* 11/07/2014 0358   CHOLHDL 3.2 11/07/2014 0358   VLDL 26 11/07/2014 0358   LDLCALC 55 11/07/2014 0358   LDLDIRECT 93.3 01/22/2013 0854  Wt Readings from Last 3 Encounters:  12/01/14 75.66 kg (166 lb 12.8 oz)  11/24/14 75.297 kg (166 lb)  11/12/14 77.202 kg (170 lb 3.2 oz)      Other studies Reviewed: Additional studies/ records that were reviewed today include: Reviewed recent hospital stay. The findings include essentially 1 vessel supplies the entire heart, the left internal mammary to LAD.    ASSESSMENT AND PLAN:  1. Coronary atherosclerosis of autologous vein bypass graft without angina Failed native and saphenous vein grafts. Patent LIMA to LAD. Angina is not clinically problematic at this time.  2. Chronic kidney disease, stage IV (severe) (HCC) Stable class for CK  3. Essential hypertension Well controlled  4. Chronic combined systolic and diastolic HF (heart failure) (HCC) No evidence of volume overload on exam  5. Dyslipidemia On therapy followed by primary care    Current medicines are reviewed at length with the patient today.  The patient has the following concerns regarding medicines: No change in the current regimen. He seems to have gotten Diuresis from 40 mg of furosemide twice a day.  The following changes/actions have been instituted:    Continue the current medical regimen  Encouraged to notify us and to use sublingual nitroglycerin if chest discomfort  No need or plans to artificially revascularize  given comorbidities and age. Labs/ tests ordered today include:  No orders of the defined types were placed in this encounter.     Disposition:   FU with HS in 3 months  Signed, [name], MD  12/01/2014 8:18 AM    [facility] [address], [place], [place]  [address] Phone: [phone]; Fax: [phone]

## 2014-12-02 DIAGNOSIS — N184 Chronic kidney disease, stage 4 (severe): Secondary | ICD-10-CM | POA: Diagnosis not present

## 2014-12-02 DIAGNOSIS — I10 Essential (primary) hypertension: Secondary | ICD-10-CM | POA: Diagnosis not present

## 2014-12-02 DIAGNOSIS — I2510 Atherosclerotic heart disease of native coronary artery without angina pectoris: Secondary | ICD-10-CM | POA: Diagnosis not present

## 2014-12-03 ENCOUNTER — Other Ambulatory Visit: Payer: Self-pay

## 2014-12-03 NOTE — Patient Outreach (Signed)
[place] [facility]) Care Management  12/03/2014  ELIAZER NEARY [birth year] [id number]  SUBJECTIVE:  Telephone call to patient regarding [facility] referral. HIPAA verified with patient. Discussed and offered [facility] care management services to patient.  Patient declined services at this time.  Patient states he feels he is managing his care pretty good.  Patient states he sees his doctors regularly. Patient states he saw his kidney doctor on 12/02/14, saw his cardiologist on 12/01/14 and saw his primary MD approximately 1 week ago.  Patient states he is able to get his medications and he understands his medications. Patient denies any concerns presently. Patient verbally agreed to receive [place] management outreach letter and pamphlet.    PLAN; RNCM will refer patient to [place] to close due to refusal of services.  RNCM will notify patients primary MD of refusal of services.  RNCM will send patient outreach letter and pamphlet as agreed upon with patient.   Quinn Plowman RN,BSN,CCM [place] Coordinator [phone]

## 2014-12-08 NOTE — Patient Outreach (Signed)
[place] [facility]) Care Management  12/08/2014  [name] 1931-10-05 [id number]   Notification from Quinn Plowman, RN to close case due to patient refused [place] Management services.  Thanks, Ronnell Freshwater. [place], [place] Assistant Phone: [phone] Fax: [phone]

## 2014-12-10 ENCOUNTER — Other Ambulatory Visit: Payer: Self-pay

## 2014-12-10 DIAGNOSIS — Z79899 Other long term (current) drug therapy: Secondary | ICD-10-CM

## 2014-12-10 DIAGNOSIS — E0822 Diabetes mellitus due to underlying condition with diabetic chronic kidney disease: Secondary | ICD-10-CM

## 2014-12-10 NOTE — Patient Outreach (Signed)
[place] [facility]) Care Management  12/10/2014  XAIN PETSKA 01/03/1932 [id number]  Subjective:   Telephone call from patient inquiring about [facility] services.   HIPAA verified.   Discussed and offered [facility] Care Management services.   Patient verbally agreed to services.   Patient states he seen several physician over the last 30 days.   Patients states he saw his primary MD 1  month ago.  Patient states he saw his cardiologist and kidney physician approximately 2 weeks ago.  Patient verbalized concern about having so many physician appointments and [facility] services but is willing to give it a try.   Patient states he is on insulin for his diabetes and he only checks is blood sugar a few times a week.   Patient is unaware of his A1C value.  Patient states he receives his medications through [facility] mail order.   Patient lives alone and his children currently live out of state.   Patient states he has local support available if needed but does not want to impose.    Assessment:  [employer] Referral.    See media tab for Silver Referral.   Patient with polypharmacy.    Plan: RNCM will refer patient to [facility] Case Manager and Pharmacy.    Quinn Plowman RN,BSN,CCM [place] Coordinator [phone]

## 2014-12-10 NOTE — Patient Outreach (Signed)
[place] [facility]) Care Management  12/10/2014  [name] [birth year] [id number]   Request from Quinn Plowman, RN to assign Pharmacy and Community RN, assigned Deanne Coffer, PharmD and [name], RN.  Thanks, Ronnell Freshwater. [place], [place] Assistant Phone: [phone] Fax: [phone]

## 2014-12-11 ENCOUNTER — Other Ambulatory Visit: Payer: Self-pay

## 2014-12-11 NOTE — Patient Outreach (Signed)
[name] was referred to me by Quinn Plowman, RN, telephonic nurse care manager, in regards to his medications.  I called [name] and set up an appointment for Tuesday, December 16, 2014 at 9 am.    Deanne Coffer, PharmD, [place] [phone]

## 2014-12-11 NOTE — Patient Outreach (Signed)
[facility]) Care Management  12/11/2014  DARRIN DERVISEVIC 1931/05/31 [id number]   Received voice message from patient on 12/11/14.  RNCM returned patients call.  Patient inquiring if services provided by [facility] care management are covered by his insurance carrier.  RNCM advised patient that [facility] care management services are covered.   PLAN: RNCM will follow up with patient at next scheduled outreach.   Quinn Plowman RN,BSN,CCM [place] Coordinator [phone]

## 2014-12-15 ENCOUNTER — Other Ambulatory Visit: Payer: Self-pay | Admitting: *Deleted

## 2014-12-15 NOTE — Patient Outreach (Signed)
[place] [facility]) Care Management  12/15/2014  [name] Feb 03, 1932 [id number]   Initial Telephonic Outreach call  Attempted to reach this pt for community case management however unsuccessful. Will continued attempts to reach this pt for ongoing [facility] services.  [name], RN Care Management Coordinator [place] [facility] Main Office [phone]

## 2014-12-16 ENCOUNTER — Other Ambulatory Visit: Payer: Self-pay

## 2014-12-16 ENCOUNTER — Other Ambulatory Visit: Payer: Self-pay | Admitting: *Deleted

## 2014-12-16 NOTE — Patient Outreach (Signed)
[name]) Care Management  12/16/2014  [name] 23-Jan-1932 [id number]  2nd Outreach attempt  RN attempted to contact pt today and introduce the [place] case management services however pt not available and RN only able to leave a HIPAA approved voice message requesting a call back. Will inquire further at that time for possible extension of [facility] services. Note pt previously with telephonic nursing via [facility]) and was referred for community case management for diabetes management.  [name], RN Care Management Coordinator [place] [facility] Main Office [phone]

## 2014-12-16 NOTE — Patient Outreach (Signed)
[facility] [facility]) Care Management  [place]   12/16/2014  [name] Oct 06, 1931 [id number]  Subjective: [name] is an 81 year old male who was referred to pharmacy for a medication reconciliation due to polypharmacy.  He was hospitalized in September for a MI and acute on chronic heart failure.  He stated he does well taking his medications.  He does not have a pill box but he is able to tell me when he takes his medications.  He also knows why he is taking the majority of his medications.  He does not have any questions for me today.    Objective:  Filed Vitals:   12/16/14 0942  BP: 145/70  Pulse: 45   A1c: 6.8%  Current Medications: Current Outpatient Prescriptions  Medication Sig Dispense Refill  . ACCU-CHEK AVIVA PLUS test strip USE TWICE DAILY AS NEEDED 100 each 12  . ACCU-CHEK SOFTCLIX LANCETS lancets 1 each by Other route 2 (two) times daily as needed for other. 200 each 12  . acetaminophen (TYLENOL) 500 MG tablet Take 500 mg by mouth daily as needed for mild pain or headache.    . Alcohol Swabs (B-D SINGLE USE SWABS REGULAR) PADS USE AS DIRECTED 100 each 12  . amLODipine (NORVASC) 10 MG tablet TAKE 1 TABLET DAILY 90 tablet 3  . aspirin EC 81 MG tablet Take 81 mg by mouth daily.    Marland Kitchen atorvastatin (LIPITOR) 80 MG tablet Take 1 tablet (80 mg total) by mouth daily at 6 PM. 30 tablet 0  . Blood Glucose Calibration (ACCU-CHEK AVIVA) SOLN USE AS DIRECTED 3 each 3  . Blood Glucose Monitoring Suppl (ACCU-CHEK AVIVA PLUS) W/DEVICE KIT USE AS DIRECTED 1 kit 0  . cloNIDine (CATAPRES) 0.1 MG tablet Take 0.1 mg by mouth daily.    . clopidogrel (PLAVIX) 75 MG tablet Take 1 tablet (75 mg total) by mouth daily. 30 tablet 0  . furosemide (LASIX) 40 MG tablet Take 1 tablet (40 mg total) by mouth 2 (two) times daily. 60 tablet 0  . insulin aspart (NOVOLOG FLEXPEN) 100 UNIT/ML FlexPen INJECT 14 UNITS INTO THE SKIN THREE TIMES DAILY WITH MEALS. 15 pen 3  .  Insulin Glargine (LANTUS) 100 UNIT/ML Solostar Pen Inject 20 Units into the skin daily at 10 pm. 5 pen 3  . Insulin Pen Needle (RELION PEN NEEDLES) 32G X 4 MM MISC 1 each by Does not apply route 2 (two) times daily as needed. 200 each 4  . isosorbide mononitrate (IMDUR) 30 MG 24 hr tablet Take 3 tablets (90 mg total) by mouth daily. 90 tablet 0  . latanoprost (XALATAN) 0.005 % ophthalmic solution Place 1 drop into both eyes at bedtime.     . Melatonin 5 MG TABS Take 5 mg by mouth at bedtime as needed.    . metoprolol (LOPRESSOR) 100 MG tablet Take 1 tablet (100 mg total) by mouth 2 (two) times daily. 60 tablet 0  . nitroGLYCERIN (NITROSTAT) 0.4 MG SL tablet Place 0.4 mg under the tongue every 5 (five) minutes as needed for chest pain (3 doses MAX).    Marland Kitchen pentoxifylline (TRENTAL) 400 MG CR tablet TAKE 1 TABLET (400 MG TOTAL)  BY MOUTH 2 (TWO) TIMES DAILY AT 10 AMAND  5 PM. 180 tablet 3  . SYRINGE-NEEDLE, DISP, 3 ML 25G X 1" 3 ML MISC 1 each by Does not apply route every 14 (fourteen) days. 50 each 3  . timolol (TIMOPTIC) 0.25 % ophthalmic solution  Place 1 drop into both eyes daily.      No current facility-administered medications for this visit.    Functional Status: In your present state of health, do you have any difficulty performing the following activities: 12/16/2014 11/07/2014  Hearing? N -  Vision? Y -  Difficulty concentrating or making decisions? N -  Walking or climbing stairs? N -  Dressing or bathing? N -  Doing errands, shopping? N N  Preparing Food and eating ? N -  Using the Toilet? N -  In the past six months, have you accidently leaked urine? N -  Do you have problems with loss of bowel control? N -  Managing your Medications? N -  Managing your Finances? N -  Housekeeping or managing your Housekeeping? N -    Fall/Depression Screening: PHQ 2/9 Scores 12/16/2014 03/28/2014 10/24/2012  PHQ - 2 Score 0 0 0    Assessment: 1.  Diabetes: Currently at goal of less than 7%.   He is on Lantus and Novolog.  He is not on an ACE inhibitor for renal protection.  He was taken off benazepril in July 2016 due to an acute rise in his creatinine.  Per the nephrologist note, he is not on an ACE inhibitor or ARB due to advance CKD.  2.  CHF:  He is on a beta blocker but it is not one of the three that are approved for CHF.  He is also not on an ACE inhibitor due to the rise in his creatinine in July 2016.   Drugs sorted by system:  Neurologic/Psychologic: none  Cardiovascular: amlodipine, furosemide, clonidine, atorvastatin, isosorbide mononitrate, clopidogrel, metoprolol tartrate, pentoxifylline, aspirin  Pulmonary/Allergy: none  Gastrointestinal: none  Endocrine: Lantus, Novolog  Renal: none  Topical: none  Pain: acetaminophen  Vitamins/Minerals: Super Beta Prostate with beta sitosterol and vitamin D, melatonin   Infectious Diseases: none  Miscellaneous: timolol eye drops, latanoprost eye drops   Duplications in therapy: none Gaps in therapy: ACE inhibitor for renal and heart protection Medications to avoid in the elderly: none Drug interactions: none Other issues noted: none  Plan: 1.  Review of Mr. Hislop medications show that he is on appropriate medications.  He could benefit from his metoprolol tartrate being switch to metoprolol succinate, which is approved for treatment of heart failure.  He could also benefit from an ACE inhibitor or ARB.  Due to him choosing not to have dialysis, he is on the track for a more palliative care treatment path.  Due to this, will not recommend switching to metoprolol succinate or adding an ACE inhibitor or ARB.  He has appropriate follow up with his providers. 2.   I have communicated my finding with his nurse care manager, [name], RN.   3.  I will close him to pharmacy since all of his pharmacy needs have been met.    Deanne Coffer, PharmD, [place] [phone]

## 2014-12-22 ENCOUNTER — Other Ambulatory Visit: Payer: Self-pay | Admitting: *Deleted

## 2014-12-22 NOTE — Patient Outreach (Signed)
[facility]) Care Management  12/22/2014  [name] 1931-11-12 [id number]  RN spoke with pt today and introduced the [facility] program and purpose of today's call. Pt reports he takes his medication as prescribed with no problems however admits he does not completed her CBG readings daily. RN expressed the importance of daily CBG in regulating his glucose levels and maintaining a good A1C. Pt unaware of his numbers related to his last A1C however feels his numbers are good as long has he takes his diabetic medication three times a day. RN offered further education and a home visit to that would require pt's participation in improving his overall management of his diabetes. Pt receptive and the initial home visit was scheduled for this week. Pt with understanding that he has to participate with possibly making some changes to improve his health and indicates he would participate.  Will make the scheduled home visit and verified or obtain consent for [facility] enrollment in community case management services to the diabetes program.  [name], RN Care Management Coordinator [place] [phone]

## 2014-12-25 ENCOUNTER — Telehealth: Payer: Self-pay | Admitting: *Deleted

## 2014-12-25 ENCOUNTER — Telehealth: Payer: Self-pay | Admitting: Interventional Cardiology

## 2014-12-25 ENCOUNTER — Encounter: Payer: Self-pay | Admitting: *Deleted

## 2014-12-25 ENCOUNTER — Other Ambulatory Visit: Payer: Self-pay | Admitting: *Deleted

## 2014-12-25 NOTE — Telephone Encounter (Signed)
Received phone call from [name], she said she is visiting pt today and his heartrate is 50 pt asymptomatic but wanted to let us know. Told Lattie Haw pt is followed by Cardiology Dr. Daneen Schick and she should contact them and Dr. Raliegh Ip is not in office today. Lattie Haw verbalized understanding and said she will call them.

## 2014-12-25 NOTE — Patient Outreach (Addendum)
[facility]) Care Management   12/25/2014  [name] April 19, 1931 [id number]  [name] is an 81 y.o. male  Subjective:  THN: Pt receptive to enrolling into the case management services for education and monitoring of his diabetes with a community nurse today.  Pt verified Deanne Coffer has visited and helped with his medications with no additional issues. Reports enough refills and medications delivered every 90 days. DIABETES: Pt states he checks his CBG but not daily and states it usually around 120-125 however slightly higher due to late night snack of [place] candy bar. Results this morning was 158 however by noon it was 85 back to his normal.  Pt reports healthier eating habits and daily exercises with [employer] at the [facility]. Pt states pretty active with no issues presented.  Pt states he is willing to monitor his CBG daily and understanding the importance of why he should comply with daily monitoring for regulating his diabetes.  MEDICATION: Pt has all his medication but states he does not know what all his medications are for and requested education.   Objective:   Review of Systems  Constitutional: Negative.   HENT: Negative.   Eyes: Negative.   Respiratory: Negative.   Cardiovascular: Negative.   Gastrointestinal: Negative.   Genitourinary: Negative.   Musculoskeletal: Negative.   Skin: Negative.   Endo/Heme/Allergies: Negative.   Psychiatric/Behavioral: Negative.     Physical Exam  Constitutional: He is oriented to person, place, and time. He appears well-developed and well-nourished.  HENT:  Right Ear: External ear normal.  Left Ear: External ear normal.  Cardiovascular: Regular rhythm.   Respiratory: Effort normal and breath sounds normal.  GI: Soft. Bowel sounds are normal.  Musculoskeletal: Normal range of motion.  Neurological: He is alert and oriented to person, place, and time.  Skin: Skin is warm and dry.  Psychiatric: He  has a normal mood and affect. His behavior is normal. Judgment and thought content normal.    Current Medications:   Current Outpatient Prescriptions  Medication Sig Dispense Refill  . ACCU-CHEK AVIVA PLUS test strip USE TWICE DAILY AS NEEDED 100 each 12  . ACCU-CHEK SOFTCLIX LANCETS lancets 1 each by Other route 2 (two) times daily as needed for other. 200 each 12  . acetaminophen (TYLENOL) 500 MG tablet Take 500 mg by mouth daily as needed for mild pain or headache.    . Alcohol Swabs (B-D SINGLE USE SWABS REGULAR) PADS USE AS DIRECTED 100 each 12  . amLODipine (NORVASC) 10 MG tablet TAKE 1 TABLET DAILY 90 tablet 3  . aspirin EC 81 MG tablet Take 81 mg by mouth daily.    Marland Kitchen atorvastatin (LIPITOR) 80 MG tablet Take 1 tablet (80 mg total) by mouth daily at 6 PM. 30 tablet 0  . Blood Glucose Calibration (ACCU-CHEK AVIVA) SOLN USE AS DIRECTED 3 each 3  . Blood Glucose Monitoring Suppl (ACCU-CHEK AVIVA PLUS) W/DEVICE KIT USE AS DIRECTED 1 kit 0  . cloNIDine (CATAPRES) 0.1 MG tablet Take 0.1 mg by mouth daily.    . clopidogrel (PLAVIX) 75 MG tablet Take 1 tablet (75 mg total) by mouth daily. 30 tablet 0  . furosemide (LASIX) 40 MG tablet Take 1 tablet (40 mg total) by mouth 2 (two) times daily. 60 tablet 0  . insulin aspart (NOVOLOG FLEXPEN) 100 UNIT/ML FlexPen INJECT 14 UNITS INTO THE SKIN THREE TIMES DAILY WITH MEALS. 15 pen 3  . Insulin Glargine (LANTUS) 100 UNIT/ML Solostar Pen Inject 20  Units into the skin daily at 10 pm. 5 pen 3  . Insulin Pen Needle (RELION PEN NEEDLES) 32G X 4 MM MISC 1 each by Does not apply route 2 (two) times daily as needed. 200 each 4  . isosorbide mononitrate (IMDUR) 30 MG 24 hr tablet Take 3 tablets (90 mg total) by mouth daily. 90 tablet 0  . latanoprost (XALATAN) 0.005 % ophthalmic solution Place 1 drop into both eyes at bedtime.     . Melatonin 5 MG TABS Take 5 mg by mouth at bedtime as needed.    . metoprolol (LOPRESSOR) 100 MG tablet Take 1 tablet (100 mg  total) by mouth 2 (two) times daily. 60 tablet 0  . nitroGLYCERIN (NITROSTAT) 0.4 MG SL tablet Place 0.4 mg under the tongue every 5 (five) minutes as needed for chest pain (3 doses MAX).    Marland Kitchen pentoxifylline (TRENTAL) 400 MG CR tablet TAKE 1 TABLET (400 MG TOTAL)  BY MOUTH 2 (TWO) TIMES DAILY AT 10 AMAND  5 PM. 180 tablet 3  . SYRINGE-NEEDLE, DISP, 3 ML 25G X 1" 3 ML MISC 1 each by Does not apply route every 14 (fourteen) days. 50 each 3  . timolol (TIMOPTIC) 0.25 % ophthalmic solution Place 1 drop into both eyes daily.      No current facility-administered medications for this visit.    Functional Status:   In your present state of health, do you have any difficulty performing the following activities: 12/25/2014 12/16/2014  Hearing? N N  Vision? N Y  Difficulty concentrating or making decisions? N N  Walking or climbing stairs? N N  Dressing or bathing? N N  Doing errands, shopping? N N  Preparing Food and eating ? N N  Using the Toilet? N N  In the past six months, have you accidently leaked urine? N N  Do you have problems with loss of bowel control? N N  Managing your Medications? N N  Managing your Finances? N N  Housekeeping or managing your Housekeeping? N N    Fall/Depression Screening:    PHQ 2/9 Scores 12/16/2014 03/28/2014 10/24/2012  PHQ - 2 Score 0 0 0   Filed Vitals:   12/25/14 1357  BP: 114/60  Pulse: 50  Resp: 20    Assessment:   Introduced [facility] services and program information Case management related to Diabetes Medication education related to prescribed medications Bradycardia (low heart rate at 50) related to assessment completed today Plan:  Physical assessment complete with no acute issues found today. Will verify signed consent and enroll pt into program for [facility] case management services. Will verify pt is receptive to the plan of care and goals discussed today for services to be initiated. Will verify pt has the equipiment to complete daily CBGs and  capable of documenting all readings into the [facility] calendar tool for providers to view. Will began education on teaching pt about diabetes and the common signs that may be experienced and what to do if acute symptoms are encountered.  Education reviewed today for ongoing increase in pt's knowledge base was EMMI printed material (Diabetes;Why check your Blood Sugar/Diabetes: Why get You A1C checked/Type 2 Diabetes Diet) Will discuss and review all medications. Will educate pt on the purpose of all his medications and verified dosages. Will also verify enough supplies and refills.  Will contact Dr. Truddie Hidden office concerning pt's heart rate today as pt remains asymptomatic with readings of 50 upon the assessment completed today. Will educate pt on  when to seek medical attention with any participating symptoms if presented.  Plan of care discussed and goals set as pt remains receptive to all that has been discussed today.  Will verify pt is receptive to another scheduled home visit next month for ongoing case management services.   Addendum: RN contacted Dr. Burnice Logan and spoke with Butch Penny. RN reported pt's heart rate and pt remains asymptomatic however requested RN contact pt's cardiologist Dr. Daneen Schick concerning pt's HR. RN contacted Dr. Thompson Caul office and reported pt's bradycardia (HR at 33) as pt remains asymptomatic to [facility] representative at Dr. Thompson Caul office with no interventions requested at this time however will report and have someone follow up with the pt.  Office called back and inquired with pt further on his HR. Pt states he took all his morning medications along with a Nitroglycerin tablet indicating he "was not feeling well" not with chest pain at the time. This RN educated pt on when to administer the Nitroglycerin tablet with active chest pain as directed not for "feeling bad" for the day due to the side effects of possible lower HR and BP  and headaches (pt verbalized an  understanding). Pt aware when to contact his provider with any other participating symptoms of distress. Based upon the interactions today and preventive measures RN avoid possible ED visit and was able to recognize a medical problem related.   [name], RN Care Management Coordinator [facility] Main Office [phone]

## 2014-12-25 NOTE — Telephone Encounter (Signed)
New Message  RN [name]) from [facility] calling to report a HR of 50 for pt. She wanted to relay that to Dr Tamala Julian

## 2014-12-25 NOTE — Telephone Encounter (Signed)
Called patient about his decrease in HR. [name] RN, is with the patient. Patient is asymptomatic at this time. Patient took all his morning medications, which included Imdur 90 mg (3 tabs). Patient states this is how he normally takes his Imdur. Patient also stated he had taken a nitroglycerin tablet this morning, because he was not feeling well. Patient stated he felt better after one nitroglycerin. Explained to patient that his low HR is probably due to the combination of nitroglycerin and Imdur. The [place] Lattie Haw, wanted to clarify if thepatient should be taking Imdur 90 (3 tablets of 30 mg) daily or Imdur 30 mg three times daily. Will forward to Dr. Tamala Julian for advisement.

## 2014-12-25 NOTE — Telephone Encounter (Signed)
90 mg once daily not 30 mg 3 times daily.

## 2014-12-26 NOTE — Telephone Encounter (Signed)
Patient called back and wanted to let Dr. Tamala Julian know that he has some SOB also. Patient stated he has been having SOB for a while, but he is concerned about it. Patient is taking his Lasix as prescribed. Patient denies weight gain and swelling. Patient has a follow-up appointment in January with Dr. Tamala Julian. Will forward to Dr. Tamala Julian so he is aware.

## 2014-12-26 NOTE — Telephone Encounter (Signed)
Left message to call back  

## 2014-12-29 ENCOUNTER — Emergency Department (HOSPITAL_COMMUNITY): Payer: Commercial Managed Care - HMO

## 2014-12-29 ENCOUNTER — Observation Stay (HOSPITAL_COMMUNITY)
Admission: EM | Admit: 2014-12-29 | Discharge: 2015-01-01 | Disposition: A | Discharge status: Home / Self Care | Payer: Commercial Managed Care - HMO | Attending: Cardiovascular Disease | Admitting: Cardiovascular Disease

## 2014-12-29 ENCOUNTER — Telehealth: Payer: Self-pay | Admitting: Internal Medicine

## 2014-12-29 ENCOUNTER — Encounter (HOSPITAL_COMMUNITY): Payer: Self-pay | Admitting: Emergency Medicine

## 2014-12-29 DIAGNOSIS — R7989 Other specified abnormal findings of blood chemistry: Secondary | ICD-10-CM

## 2014-12-29 DIAGNOSIS — J90 Pleural effusion, not elsewhere classified: Secondary | ICD-10-CM

## 2014-12-29 DIAGNOSIS — R001 Bradycardia, unspecified: Secondary | ICD-10-CM | POA: Diagnosis not present

## 2014-12-29 DIAGNOSIS — I2510 Atherosclerotic heart disease of native coronary artery without angina pectoris: Secondary | ICD-10-CM | POA: Diagnosis not present

## 2014-12-29 DIAGNOSIS — I5023 Acute on chronic systolic (congestive) heart failure: Secondary | ICD-10-CM | POA: Diagnosis not present

## 2014-12-29 DIAGNOSIS — Z87891 Personal history of nicotine dependence: Secondary | ICD-10-CM | POA: Diagnosis not present

## 2014-12-29 DIAGNOSIS — N184 Chronic kidney disease, stage 4 (severe): Secondary | ICD-10-CM | POA: Diagnosis not present

## 2014-12-29 DIAGNOSIS — E119 Type 2 diabetes mellitus without complications: Secondary | ICD-10-CM | POA: Insufficient documentation

## 2014-12-29 DIAGNOSIS — E1121 Type 2 diabetes mellitus with diabetic nephropathy: Secondary | ICD-10-CM | POA: Diagnosis not present

## 2014-12-29 DIAGNOSIS — R0600 Dyspnea, unspecified: Secondary | ICD-10-CM

## 2014-12-29 DIAGNOSIS — E1140 Type 2 diabetes mellitus with diabetic neuropathy, unspecified: Secondary | ICD-10-CM | POA: Diagnosis not present

## 2014-12-29 DIAGNOSIS — Z7902 Long term (current) use of antithrombotics/antiplatelets: Secondary | ICD-10-CM | POA: Insufficient documentation

## 2014-12-29 DIAGNOSIS — Z7982 Long term (current) use of aspirin: Secondary | ICD-10-CM | POA: Diagnosis not present

## 2014-12-29 DIAGNOSIS — I129 Hypertensive chronic kidney disease with stage 1 through stage 4 chronic kidney disease, or unspecified chronic kidney disease: Secondary | ICD-10-CM | POA: Insufficient documentation

## 2014-12-29 DIAGNOSIS — R0609 Other forms of dyspnea: Secondary | ICD-10-CM

## 2014-12-29 DIAGNOSIS — N185 Chronic kidney disease, stage 5: Secondary | ICD-10-CM | POA: Diagnosis present

## 2014-12-29 DIAGNOSIS — I10 Essential (primary) hypertension: Secondary | ICD-10-CM

## 2014-12-29 DIAGNOSIS — R0602 Shortness of breath: Secondary | ICD-10-CM

## 2014-12-29 DIAGNOSIS — Z8249 Family history of ischemic heart disease and other diseases of the circulatory system: Secondary | ICD-10-CM | POA: Insufficient documentation

## 2014-12-29 DIAGNOSIS — Z794 Long term (current) use of insulin: Secondary | ICD-10-CM | POA: Diagnosis not present

## 2014-12-29 DIAGNOSIS — I252 Old myocardial infarction: Secondary | ICD-10-CM | POA: Insufficient documentation

## 2014-12-29 DIAGNOSIS — E1122 Type 2 diabetes mellitus with diabetic chronic kidney disease: Secondary | ICD-10-CM | POA: Insufficient documentation

## 2014-12-29 DIAGNOSIS — I25810 Atherosclerosis of coronary artery bypass graft(s) without angina pectoris: Secondary | ICD-10-CM | POA: Insufficient documentation

## 2014-12-29 DIAGNOSIS — E785 Hyperlipidemia, unspecified: Secondary | ICD-10-CM | POA: Diagnosis not present

## 2014-12-29 DIAGNOSIS — I255 Ischemic cardiomyopathy: Secondary | ICD-10-CM | POA: Diagnosis present

## 2014-12-29 DIAGNOSIS — Z79899 Other long term (current) drug therapy: Secondary | ICD-10-CM | POA: Diagnosis not present

## 2014-12-29 DIAGNOSIS — R778 Other specified abnormalities of plasma proteins: Secondary | ICD-10-CM | POA: Diagnosis present

## 2014-12-29 HISTORY — DX: Ischemic cardiomyopathy: I25.5

## 2014-12-29 HISTORY — DX: Chronic systolic (congestive) heart failure: I50.22

## 2014-12-29 HISTORY — DX: Chronic kidney disease, stage 4 (severe): N18.4

## 2014-12-29 LAB — COMPREHENSIVE METABOLIC PANEL
ALT: 47 U/L (ref 17–63)
AST: 33 U/L (ref 15–41)
Albumin: 3.5 g/dL (ref 3.5–5.0)
Alkaline Phosphatase: 69 U/L (ref 38–126)
Anion gap: 9 (ref 5–15)
BUN: 37 mg/dL — ABNORMAL HIGH (ref 6–20)
CHLORIDE: 107 mmol/L (ref 101–111)
CO2: 26 mmol/L (ref 22–32)
CREATININE: 2.99 mg/dL — AB (ref 0.61–1.24)
Calcium: 9.4 mg/dL (ref 8.9–10.3)
GFR calc non Af Amer: 18 mL/min — ABNORMAL LOW (ref >60)
GFR, EST AFRICAN AMERICAN: 21 mL/min — AB (ref >60)
Glucose, Bld: 153 mg/dL — ABNORMAL HIGH (ref 65–99)
POTASSIUM: 3.7 mmol/L (ref 3.5–5.1)
SODIUM: 142 mmol/L (ref 135–145)
Total Bilirubin: 0.6 mg/dL (ref 0.3–1.2)
Total Protein: 6.1 g/dL — ABNORMAL LOW (ref 6.5–8.1)

## 2014-12-29 LAB — I-STAT TROPONIN, ED
TROPONIN I, POC: 0.04 ng/mL (ref 0.00–0.08)
TROPONIN I, POC: 0.14 ng/mL — AB (ref 0.00–0.08)

## 2014-12-29 LAB — CBC WITH DIFFERENTIAL/PLATELET
BASOS ABS: 0 10*3/uL (ref 0.0–0.1)
Basophils Relative: 1 %
Eosinophils Absolute: 0.3 10*3/uL (ref 0.0–0.7)
Eosinophils Relative: 6 %
HEMATOCRIT: 33.5 % — AB (ref 39.0–52.0)
HEMOGLOBIN: 10.7 g/dL — AB (ref 13.0–17.0)
LYMPHS PCT: 20 %
Lymphs Abs: 1.2 10*3/uL (ref 0.7–4.0)
MCH: 28 pg (ref 26.0–34.0)
MCHC: 31.9 g/dL (ref 30.0–36.0)
MCV: 87.7 fL (ref 78.0–100.0)
Monocytes Absolute: 0.4 10*3/uL (ref 0.1–1.0)
Monocytes Relative: 8 %
NEUTROS ABS: 3.8 10*3/uL (ref 1.7–7.7)
NEUTROS PCT: 65 %
PLATELETS: 219 10*3/uL (ref 150–400)
RBC: 3.82 MIL/uL — AB (ref 4.22–5.81)
RDW: 14.9 % (ref 11.5–15.5)
WBC: 5.8 10*3/uL (ref 4.0–10.5)

## 2014-12-29 LAB — TROPONIN I
TROPONIN I: 0.09 ng/mL — AB (ref <0.031)
TROPONIN I: 0.1 ng/mL — AB (ref <0.031)

## 2014-12-29 LAB — BRAIN NATRIURETIC PEPTIDE: B NATRIURETIC PEPTIDE 5: 1114.2 pg/mL — AB (ref 0.0–100.0)

## 2014-12-29 LAB — TSH: TSH: 2.846 u[IU]/mL (ref 0.350–4.500)

## 2014-12-29 LAB — MRSA PCR SCREENING: MRSA by PCR: NEGATIVE

## 2014-12-29 MED ORDER — SODIUM CHLORIDE 0.9 % IJ SOLN: 3.0000 mL | INTRAMUSCULAR | Status: DC | PRN

## 2014-12-29 MED ORDER — ZOLPIDEM TARTRATE 5 MG PO TABS
5.0000 mg | ORAL_TABLET | Freq: Every evening | ORAL | Status: DC | PRN
Start: 2014-12-29 — End: 2015-01-01
  Administered 2014-12-29 – 2015-01-01 (×3): 5 mg via ORAL
  Filled 2014-12-29 (×3): qty 1

## 2014-12-29 MED ORDER — DIPHENHYDRAMINE HCL 25 MG PO CAPS: 25.0000 mg | ORAL_CAPSULE | Freq: Every evening | ORAL | Status: DC | PRN

## 2014-12-29 MED ORDER — HEPARIN SODIUM (PORCINE) 5000 UNIT/ML IJ SOLN
5000.0000 [IU] | Freq: Three times a day (TID) | INTRAMUSCULAR | Status: DC
  Administered 2014-12-29 – 2015-01-01 (×8): 5000 [IU] via SUBCUTANEOUS
  Filled 2014-12-29 (×8): qty 1

## 2014-12-29 MED ORDER — ATORVASTATIN CALCIUM 80 MG PO TABS
80.0000 mg | ORAL_TABLET | Freq: Every day | ORAL | Status: DC
  Administered 2014-12-29 – 2014-12-31 (×3): 80 mg via ORAL
  Filled 2014-12-29 (×3): qty 1

## 2014-12-29 MED ORDER — ONDANSETRON HCL 4 MG/2ML IJ SOLN: 4.0000 mg | Freq: Four times a day (QID) | INTRAMUSCULAR | Status: DC | PRN

## 2014-12-29 MED ORDER — SODIUM CHLORIDE 0.9 % IJ SOLN
3.0000 mL | Freq: Two times a day (BID) | INTRAMUSCULAR | Status: DC
  Administered 2014-12-29 – 2014-12-31 (×5): 3 mL via INTRAVENOUS

## 2014-12-29 MED ORDER — METOPROLOL TARTRATE 50 MG PO TABS
50.0000 mg | ORAL_TABLET | Freq: Two times a day (BID) | ORAL | Status: DC
  Administered 2014-12-29: 50 mg via ORAL
  Filled 2014-12-29: qty 1

## 2014-12-29 MED ORDER — CLONIDINE HCL 0.1 MG PO TABS
0.1000 mg | ORAL_TABLET | Freq: Every day | ORAL | Status: DC
  Administered 2014-12-29 – 2014-12-31 (×3): 0.1 mg via ORAL
  Filled 2014-12-29 (×3): qty 1

## 2014-12-29 MED ORDER — CLOPIDOGREL BISULFATE 75 MG PO TABS
75.0000 mg | ORAL_TABLET | Freq: Every day | ORAL | Status: DC
  Administered 2014-12-30 – 2015-01-01 (×3): 75 mg via ORAL
  Filled 2014-12-29 (×3): qty 1

## 2014-12-29 MED ORDER — ISOSORBIDE MONONITRATE ER 60 MG PO TB24
90.0000 mg | ORAL_TABLET | Freq: Every day | ORAL | Status: DC
  Administered 2014-12-30 – 2015-01-01 (×3): 90 mg via ORAL
  Filled 2014-12-29 (×6): qty 1

## 2014-12-29 MED ORDER — TIMOLOL MALEATE 0.25 % OP SOLN
1.0000 [drp] | Freq: Every day | OPHTHALMIC | Status: DC
  Administered 2014-12-30 – 2015-01-01 (×3): 1 [drp] via OPHTHALMIC
  Filled 2014-12-29 (×2): qty 5

## 2014-12-29 MED ORDER — PENTOXIFYLLINE ER 400 MG PO TBCR
400.0000 mg | EXTENDED_RELEASE_TABLET | Freq: Two times a day (BID) | ORAL | Status: DC
  Administered 2014-12-29 – 2015-01-01 (×6): 400 mg via ORAL
  Filled 2014-12-29 (×9): qty 1

## 2014-12-29 MED ORDER — SODIUM CHLORIDE 0.9 % IV SOLN: 250.0000 mL | INTRAVENOUS | Status: DC | PRN

## 2014-12-29 MED ORDER — NITROGLYCERIN 0.4 MG SL SUBL: 0.4000 mg | SUBLINGUAL_TABLET | SUBLINGUAL | Status: DC | PRN

## 2014-12-29 MED ORDER — INSULIN GLARGINE 100 UNIT/ML ~~LOC~~ SOLN
20.0000 [IU] | Freq: Every day | SUBCUTANEOUS | Status: DC
  Administered 2014-12-29 – 2014-12-31 (×3): 20 [IU] via SUBCUTANEOUS
  Filled 2014-12-29 (×4): qty 0.2

## 2014-12-29 MED ORDER — ASPIRIN EC 81 MG PO TBEC
81.0000 mg | DELAYED_RELEASE_TABLET | Freq: Every day | ORAL | Status: DC
  Administered 2014-12-30 – 2015-01-01 (×3): 81 mg via ORAL
  Filled 2014-12-29 (×3): qty 1

## 2014-12-29 MED ORDER — LATANOPROST 0.005 % OP SOLN
1.0000 [drp] | Freq: Every day | OPHTHALMIC | Status: DC
  Administered 2014-12-29 – 2014-12-31 (×3): 1 [drp] via OPHTHALMIC
  Filled 2014-12-29: qty 2.5

## 2014-12-29 MED ORDER — FUROSEMIDE 10 MG/ML IJ SOLN
40.0000 mg | Freq: Two times a day (BID) | INTRAMUSCULAR | Status: DC
  Administered 2014-12-29 – 2014-12-30 (×2): 40 mg via INTRAVENOUS
  Filled 2014-12-29 (×2): qty 4

## 2014-12-29 MED ORDER — ACETAMINOPHEN 500 MG PO TABS: 500.0000 mg | ORAL_TABLET | Freq: Every day | ORAL | Status: DC | PRN

## 2014-12-29 MED ORDER — FUROSEMIDE 10 MG/ML IJ SOLN
40.0000 mg | Freq: Once | INTRAMUSCULAR | Status: AC
  Administered 2014-12-29: 40 mg via INTRAVENOUS
  Filled 2014-12-29: qty 4

## 2014-12-29 MED ORDER — AMLODIPINE BESYLATE 10 MG PO TABS
10.0000 mg | ORAL_TABLET | Freq: Every day | ORAL | Status: DC
  Administered 2014-12-29 – 2014-12-31 (×3): 10 mg via ORAL
  Filled 2014-12-29 (×3): qty 1

## 2014-12-29 MED ORDER — INSULIN ASPART 100 UNIT/ML ~~LOC~~ SOLN
0.0000 [IU] | Freq: Three times a day (TID) | SUBCUTANEOUS | Status: DC
  Administered 2014-12-30: 2 [IU] via SUBCUTANEOUS
  Administered 2014-12-30: 1 [IU] via SUBCUTANEOUS
  Administered 2014-12-31: 2 [IU] via SUBCUTANEOUS
  Administered 2014-12-31 – 2015-01-01 (×3): 1 [IU] via SUBCUTANEOUS

## 2014-12-29 NOTE — Telephone Encounter (Signed)
Patient Name: OZAIR PONTING DOB: Sep 17, 1931 Initial Comment Caller states has an appt but is short of breath Nurse Assessment Nurse: Ronnald Ramp, RN, Miranda Date/Time (Eastern Time): 12/29/2014 9:58:31 AM Confirm and document reason for call. If symptomatic, describe symptoms. ---Caller states he has felt SOB for 1 week. Denies any cough or congestion. Has the patient traveled out of the country within the last 30 days? ---Not Applicable Does the patient have any new or worsening symptoms? ---Yes Will a triage be completed? ---Yes Related visit to physician within the last 2 weeks? ---No Does the PT have any chronic conditions? (i.e. diabetes, asthma, etc.) ---Yes List chronic conditions. ---Diabetes, recent MI Guidelines Guideline Title Affirmed Question Affirmed Notes Breathing Difficulty Major surgery in the past month Final Disposition User Go to ED Now Ronnald Ramp, RN, Miranda Comments Caller started having phone issues and had to hang up and call back. Referrals [facility] - ED Disagree/Comply: Disagree Disagree/Comply Reason: Wait and see

## 2014-12-29 NOTE — Telephone Encounter (Signed)
FYI; Patient has appointment on Wednesday to see Dr. Raliegh Ip

## 2014-12-29 NOTE — ED Notes (Signed)
Pt ambulated down hallway with pulse oximetry. Pt maintained sats above 98% and HR 60 SR. Pt denied any SOB while walking. Dr. Billy Fischer made aware.

## 2014-12-29 NOTE — ED Notes (Signed)
Pt states he started feeling short of breath 1 week ago. And has been getting progressively worse on exertion. Denies any chest pain. Pt is warm and dry. Alert and ox4. Breathing in unlabored and resting comfortably.

## 2014-12-29 NOTE — ED Provider Notes (Signed)
CSN: [id number]     Arrival date & time 12/29/14  1056 History   First MD Initiated Contact with Patient 12/29/14 1103     Chief Complaint  Patient presents with  . Shortness of Breath     (Consider location/radiation/quality/duration/timing/severity/associated sxs/prior Treatment) Patient is a 81 y.o. male presenting with shortness of breath.  Shortness of Breath Severity:  Moderate Onset quality:  Gradual Duration:  1 week Timing:  Intermittent Progression:  Waxing and waning Chronicity:  New Context: not URI   Relieved by:  Rest Worsened by:  Exertion Ineffective treatments:  Position changes (not worse with lying down) Associated symptoms: no abdominal pain, no chest pain, no diaphoresis, no fever, no headaches, no hemoptysis, no rash, no sore throat, no sputum production, no syncope and no vomiting   Risk factors: no hx of cancer, no hx of PE/DVT and no recent surgery   Risk factors comment:  Catheterization 9/9   Past Medical History  Diagnosis Date  . CAD (coronary artery disease)     a. s/p CABG 2003. b. NSTEMI 10/2014 - cath with bypass graft failure; vessel recanalized on IV heparin, treated medically, reserving PCI for recurrent angina.  . Hyperlipidemia   . Essential hypertension   . Myocardial infarction ([place]) 1998  . Type II diabetes mellitus ([place])   . History of stomach ulcers   . Arthritis     "touch in my fingers" (09/10/2014)  . B12 deficiency anemia     "stopped taking the shots in ~ 06/2014" (09/10/2014)  . Diabetes 1.5, managed as type 1 ([place])   . CKD (chronic kidney disease), stage IV ([place])   . PUD (peptic ulcer disease) 2013  . Chronic systolic CHF (congestive heart failure) ([facility])     a. EF 40-45% in 10/2014 (previously normal in 08/2014).  . Ischemic cardiomyopathy    Past Surgical History  Procedure Laterality Date  . Esophagogastroduodenoscopy  06/10/2011    Procedure: ESOPHAGOGASTRODUODENOSCOPY (EGD);  Surgeon: Ladene Artist, MD,FACG;   Location: [facility] ENDOSCOPY;  Service: Endoscopy;  Laterality: N/A;  . Coronary artery bypass graft  2003    CABG X5  . Cardiac catheterization  2003;   . Coronary angioplasty  1998    [name] 07/13/2010  . Cataract extraction, bilateral Bilateral   . Cardiac catheterization N/A 11/07/2014    Procedure: Left Heart Cath and Coronary Angiography;  Surgeon: Belva Crome, MD;  Location: [facility];  Service: Cardiovascular;  Laterality: N/A;   Family History  Problem Relation Age of Onset  . Diabetes      brothers and sisters  . Hypertension Sister   . Coronary artery disease    . Stroke Mother   . Hypertension Mother   . Cancer Mother   . Heart attack Sister    Social History  Substance Use Topics  . Smoking status: Former Smoker -- 0.50 packs/day for 20 years    Types: Cigarettes  . Smokeless tobacco: Never Used     Comment: "quit smoking cigarettes in the 1970's  . Alcohol Use: No    Review of Systems  Constitutional: Negative for fever, diaphoresis and fatigue.  HENT: Negative for sore throat.   Eyes: Negative for visual disturbance.  Respiratory: Positive for shortness of breath. Negative for hemoptysis, sputum production and chest tightness.   Cardiovascular: Negative for chest pain and syncope.  Gastrointestinal: Negative for nausea, vomiting, abdominal pain and diarrhea.  Genitourinary: Negative for difficulty urinating.  Musculoskeletal: Negative for back pain and  neck stiffness.  Skin: Negative for rash.  Neurological: Negative for syncope and headaches.      Allergies  Review of patient's allergies indicates no known allergies.  Home Medications   Prior to Admission medications   Medication Sig Start Date End Date Taking? Authorizing Provider  acetaminophen (TYLENOL) 500 MG tablet Take 500 mg by mouth daily as needed for mild pain or headache.   Yes Historical Provider, MD  amLODipine (NORVASC) 10 MG tablet TAKE 1 TABLET DAILY Patient taking differently:  Take 10 mg by mouth at bedtime.  03/28/14  Yes Marletta Lor, MD  aspirin EC 81 MG tablet Take 81 mg by mouth daily.   Yes Historical Provider, MD  atorvastatin (LIPITOR) 80 MG tablet Take 1 tablet (80 mg total) by mouth daily at 6 PM. 11/12/14  Yes Almyra Deforest, PA  cloNIDine (CATAPRES) 0.1 MG tablet Take 0.1 mg by mouth at bedtime.  11/26/14  Yes Historical Provider, MD  clopidogrel (PLAVIX) 75 MG tablet Take 1 tablet (75 mg total) by mouth daily. 11/12/14  Yes Almyra Deforest, PA  furosemide (LASIX) 40 MG tablet Take 1 tablet (40 mg total) by mouth 2 (two) times daily. 11/12/14  Yes Almyra Deforest, PA  insulin aspart (NOVOLOG FLEXPEN) 100 UNIT/ML FlexPen INJECT 14 UNITS INTO THE SKIN THREE TIMES DAILY WITH MEALS. 09/12/14  Yes Luke K Kilroy, PA-C  Insulin Glargine (LANTUS) 100 UNIT/ML Solostar Pen Inject 20 Units into the skin daily at 10 pm. 09/12/14  Yes Erlene Quan, PA-C  isosorbide mononitrate (IMDUR) 30 MG 24 hr tablet Take 3 tablets (90 mg total) by mouth daily. 11/12/14  Yes Almyra Deforest, PA  latanoprost (XALATAN) 0.005 % ophthalmic solution Place 1 drop into both eyes at bedtime.  08/23/14  Yes Historical Provider, MD  Melatonin 5 MG TABS Take 5 mg by mouth at bedtime as needed.   Yes Historical Provider, MD  metoprolol (LOPRESSOR) 100 MG tablet Take 1 tablet (100 mg total) by mouth 2 (two) times daily. 11/12/14  Yes Almyra Deforest, PA  pentoxifylline (TRENTAL) 400 MG CR tablet TAKE 1 TABLET (400 MG TOTAL)  BY MOUTH 2 (TWO) TIMES DAILY AT 10 AMAND  5 PM. 03/28/14  Yes Marletta Lor, MD  timolol (TIMOPTIC) 0.25 % ophthalmic solution Place 1 drop into both eyes daily.  08/23/14  Yes Historical Provider, MD  [place] test strip USE TWICE DAILY AS NEEDED Patient not taking: Reported on 12/29/2014 06/09/14   Marletta Lor, MD  ACCU-CHEK SOFTCLIX LANCETS lancets 1 each by Other route 2 (two) times daily as needed for other. Patient not taking: Reported on 12/29/2014 05/29/13   Marletta Lor, MD   Alcohol Swabs (B-D SINGLE USE SWABS REGULAR) PADS USE AS DIRECTED Patient not taking: Reported on 12/29/2014 05/29/13   Marletta Lor, MD  Blood Glucose Calibration (ACCU-CHEK AVIVA) SOLN USE AS DIRECTED 05/29/13   Marletta Lor, MD  Blood Glucose Monitoring Suppl (ACCU-CHEK AVIVA PLUS) W/DEVICE KIT USE AS DIRECTED 09/24/13   Marletta Lor, MD  Insulin Pen Needle (RELION PEN NEEDLES) 32G X 4 MM MISC 1 each by Does not apply route 2 (two) times daily as needed. 10/08/13   Marletta Lor, MD  nitroGLYCERIN (NITROSTAT) 0.4 MG SL tablet Place 0.4 mg under the tongue every 5 (five) minutes as needed for chest pain (3 doses MAX).    Historical Provider, MD  SYRINGE-NEEDLE, DISP, 3 ML 25G X 1" 3 ML MISC 1 each by  Does not apply route every 14 (fourteen) days. 06/27/11   Marletta Lor, MD   BP 160/71 mmHg  Pulse 67  Temp(Src) 97.6 F (36.4 C) (Oral)  Resp 16  Ht 5' 11"  (1.803 m)  Wt 164 lb 0.4 oz (74.4 kg)  BMI 22.89 kg/m2  SpO2 95% Physical Exam  Constitutional: He is oriented to person, place, and time. He appears well-developed and well-nourished. No distress.  HENT:  Head: Normocephalic and atraumatic.  Eyes: Conjunctivae and EOM are normal.  Neck: Normal range of motion. No JVD present.  Cardiovascular: Normal rate, regular rhythm, normal heart sounds and intact distal pulses.  Exam reveals no gallop and no friction rub.   No murmur heard. Pulmonary/Chest: Effort normal. No respiratory distress. He has no wheezes. He has rales (bibasilar).  Abdominal: Soft. He exhibits no distension. There is no tenderness. There is no guarding.  Musculoskeletal: He exhibits no edema.  Neurological: He is alert and oriented to person, place, and time.  Skin: Skin is warm and dry. He is not diaphoretic.  Nursing note and vitals reviewed.   ED Course  Procedures (including critical care time) Labs Review Labs Reviewed  CBC WITH DIFFERENTIAL/PLATELET - Abnormal; Notable for the  following:    RBC 3.82 (*)    Hemoglobin 10.7 (*)    HCT 33.5 (*)    All other components within normal limits  COMPREHENSIVE METABOLIC PANEL - Abnormal; Notable for the following:    Glucose, Bld 153 (*)    BUN 37 (*)    Creatinine, Ser 2.99 (*)    Total Protein 6.1 (*)    GFR calc non Af Amer 18 (*)    GFR calc Af Amer 21 (*)    All other components within normal limits  BRAIN NATRIURETIC PEPTIDE - Abnormal; Notable for the following:    B Natriuretic Peptide 1114.2 (*)    All other components within normal limits  TROPONIN I - Abnormal; Notable for the following:    Troponin I 0.09 (*)    All other components within normal limits  TROPONIN I - Abnormal; Notable for the following:    Troponin I 0.10 (*)    All other components within normal limits  I-STAT TROPOININ, ED - Abnormal; Notable for the following:    Troponin i, poc 0.14 (*)    All other components within normal limits  MRSA PCR SCREENING  TSH  BASIC METABOLIC PANEL  TROPONIN I  TROPONIN I  I-STAT TROPOININ, ED    Imaging Review Dg Chest 2 View  12/29/2014  CLINICAL DATA:  Shortness of breath EXAM: CHEST  2 VIEW COMPARISON:  11/06/2014 chest radiograph FINDINGS: Median sternotomy wires are aligned and intact. Stable cardiomediastinal silhouette with mild cardiomegaly. No pneumothorax. New small bilateral pleural effusions. Hyperinflated lungs. Patchy bibasilar lung opacities. IMPRESSION: 1. New small bilateral pleural effusions. 2. Patchy bibasilar lung opacities, which are nonspecific and could represent aspiration, pneumonia and/or atelectasis. 3. Stable mild cardiomegaly without pulmonary edema. 4. Hyperinflated lungs, suggesting underlying COPD. Electronically Signed   By: Ilona Sorrel M.D.   On: 12/29/2014 12:21   I have personally reviewed and evaluated these images and lab results as part of my medical decision-making.   EKG Interpretation   Date/Time:  Monday December 29 2014 11:08:54 EDT Ventricular  Rate:  56 PR Interval:  225 QRS Duration: 104 QT Interval:  483 QTC Calculation: 466 R Axis:   -69 Text Interpretation:  Sinus bradycardia Probable left atrial enlargement  Left  anterior fascicular block Abnormal R-wave progression, late  transition ST depressions precordial leads no longer present Nonspecific  TW abnormality Confirmed by [facility] MD, Rasmus Preusser ([id number]) on 12/29/2014  11:28:24 AM      MDM   Final diagnoses:  Pleural effusion  Shortness of breath  Elevated troponin  Exertional dyspnea  Acute on chronic systolic CHF (congestive heart failure) ([place])    81 year old male with a history of diabetes, hypertension, hyperlipidemia, chronic kidney disease, coronary artery disease status post recent MI with stenting 11/07/2014, EF 40-45%  Presents with concern for 1 week of exertional shortness of breath.  Differential diagnosis for dyspnea includes ACS, PE, COPD exacerbation, CHF exacerbation, anemia, pneumonia.  Chest x-ray was done which showed  Small bilateral pleural effusions without signs of pneumonia or pulmonary edema. EKG was evaluated by me which showed  Sinus bradycardia which has been present on prior EKGs as well as left anterior fascicular block, similar to prior and ST changes seen on most recent EKG in anterior leads improved.  BNP was  Elevated to 1114 with no previous values for comparison.    While patient denies chest pain, shortness of breath concerning for possible anginal equivalent, and delta troponins were obtained. Initial troponin was normal, however repeat troponin was elevated at 0.14.   Consulted cardiology for further evaluation.   While patient reports his exertional shortness of breath was worse this morning, did not have a discrete episode of this at rest , has not had chest pain , and is unclear if this elevated value represents an NSTEMI versus elevation in setting of likely congestive heart failure and chronic kidney disease.  Patient was given 40 mg  of IV Lasix for concern of elevated BNP, small pleural effusions and mild CHF exacerbation. He has been able to ambulate in the ED without change in oxygenation (prior to return of elevated troponin.)  Awaiting cardiac evaluation for elevated troponin and recommendations.  Cardiology to admit patient for concern for CHF exacerbation.     Gareth Morgan, MD 12/29/14 2146

## 2014-12-29 NOTE — H&P (Signed)
Cardiology H&P  Patient ID: [name], MRN: [id number], DOB/AGE: 30-Apr-1931 81 y.o. Admit date: 12/29/2014   Date of Consult: 12/29/2014 Primary Physician: [name], MD Primary Cardiologist: Dr. Tamala Julian  Chief Complaint: SOB Reason for Consultation: SOB, elevated troponin, complex cardiac hx recently  HPI: [name] is an 81 y/o M with history of CAD (s/p CABG 2003), CKD stage IV, HTN, DM with nephropathy, HLD, arthritis, peptic ulcer disease (1840), chronic systolic CHF (EF 37-54% in 10/2014 - previously normal in 08/2014), chronic anemia who presented to [facility] today with SOB.  Recent history is complex. He was admitted in July 2016 with unstable angina and worsening renal function. Nuclear stress test was high risk at that time but cath was not performed as the patient refused to go on dialysis at that time if needed. He was readmitted 11/06/14 with acute MI and inferior Q waves with suggestion of ST elevation. The patient agreed to dialysis if needed so he underwent cardiac cath showing bypass graft failure with total occlusion of SVG to RCA, total occlusion of SVG to diagonal, thrombotic high-grade stenosis throughout the entirety of sequential SVG to OM1/OM 2, patent LIMA to LAD with distal LAD collateral to RCA, severely elevated LVEDP 35-40 mmHg. Per Dr. Tamala Julian, it was felt that sequential SVG to OM1/OM 2 was completely occluded resulting in elevated enzyme, subsequently recanalized on IV heparin. He noted that if he had recurrent angina, may need to consider PCI. 2D echo showed EF 40-45%, akinesis of inferior myocardium, mild MR, PASP 7mHg. He has been followed by Dr. SJoelyn Omswith nephrology.  [facility]. Per intake notes, the patient has been SOB for 1 week. He reported to me that this just started today, but outpatient phone notes corroborate that he called in last week c/o dyspnea. This morning while moving out and about he felt  increased dyspnea that lasted about 30 minutes to an hour. It was not associated with any other symptoms including chest pain, nausea, diaphoresis, cough, fever, chills, palpitations, near-syncope, or syncope. He denies any orthopnea, LEE, weight change, bleeding, or med noncompliance. [facility] any [facility] (although as above, phone note last week suggested increased SOB). In the ER, labs reveal BNP 1111 (no prior to compare to), troponin 0.04 (POC) ->0.14 (POC)-> 0.09 (full). The last time he had normal full troponin was in 2013. BUN/Cr 37/2.99 (prev 3.5 on 11/21/14), Hgb 10.7 c/w prior. CXR showed new small bialteral effusions, patchy bibasilar lung opacities nonspecific could represent aspiration/PNA/atx, stable mild cardiomegaly without pulmonary edema, hyperinflated lungs suggesting underlying COPD. Telemetry shows NSR/SB HR low 40s at times, occasional PVCs. He is not tachycardic, tachypneic or hypoxic. He has since ambulated in the ER without any SOB or desaturation. Recent dry weights 165-167.   Past Medical History  Diagnosis Date  . CAD (coronary artery disease)     a. s/p CABG 2003. b. NSTEMI 10/2014 - cath with bypass graft failure; vessel recanalized on IV heparin, treated medically, reserving PCI for recurrent angina.  . Hyperlipidemia   . Essential hypertension   . [place]ocardial [place]  . Type II diabetes mellitus [place]   . History of stomach ulcers   . Arthritis     "touch in my fingers" (09/10/2014)  . B12 deficiency anemia     "stopped taking the shots in ~ 06/2014" (09/10/2014)  . Diabetes 1.5, managed  as type 1 ([place])   . CKD (chronic kidney disease), stage IV ([place])   . PUD (peptic ulcer disease) 2013  . Chronic systolic CHF (congestive heart failure) (HCC)     a. EF 40-45% in 10/2014 (previously normal in 08/2014).  . Ischemic cardiomyopathy        Surgical History:  Past Surgical History   Procedure Laterality Date  . Esophagogastroduodenoscopy  06/10/2011    Procedure: ESOPHAGOGASTRODUODENOSCOPY (EGD);  Surgeon: Ladene Artist, MD,FACG;  Location: [facility] ENDOSCOPY;  Service: Endoscopy;  Laterality: N/A;  . Coronary artery bypass graft  2003    CABG X5  . Cardiac catheterization  2003;   . Coronary angioplasty  1998    [name] 07/13/2010  . Cataract extraction, bilateral Bilateral   . Cardiac catheterization N/A 11/07/2014    Procedure: Left Heart Cath and Coronary Angiography;  Surgeon: Belva Crome, MD;  Location: [place] CV LAB;  Service: Cardiovascular;  Laterality: N/A;     Home Meds: Prior to Admission medications   Medication Sig Start Date End Date Taking? Authorizing Provider  acetaminophen (TYLENOL) 500 MG tablet Take 500 mg by mouth daily as needed for mild pain or headache.   Yes Historical Provider, MD  amLODipine (NORVASC) 10 MG tablet TAKE 1 TABLET DAILY Patient taking differently: Take 10 mg by mouth at bedtime.  03/28/14  Yes Marletta Lor, MD  aspirin EC 81 MG tablet Take 81 mg by mouth daily.   Yes Historical Provider, MD  atorvastatin (LIPITOR) 80 MG tablet Take 1 tablet (80 mg total) by mouth daily at 6 PM. 11/12/14  Yes Almyra Deforest, PA  cloNIDine (CATAPRES) 0.1 MG tablet Take 0.1 mg by mouth at bedtime.  11/26/14  Yes Historical Provider, MD  clopidogrel (PLAVIX) 75 MG tablet Take 1 tablet (75 mg total) by mouth daily. 11/12/14  Yes Almyra Deforest, PA  furosemide (LASIX) 40 MG tablet Take 1 tablet (40 mg total) by mouth 2 (two) times daily. 11/12/14  Yes Almyra Deforest, PA  insulin aspart (NOVOLOG FLEXPEN) 100 UNIT/ML FlexPen INJECT 14 UNITS INTO THE SKIN THREE TIMES DAILY WITH MEALS. 09/12/14  Yes Luke K Kilroy, PA-C  Insulin Glargine (LANTUS) 100 UNIT/ML Solostar Pen Inject 20 Units into the skin daily at 10 pm. 09/12/14  Yes Erlene Quan, PA-C  isosorbide mononitrate (IMDUR) 30 MG 24 hr tablet Take 3 tablets (90 mg total) by mouth daily. 11/12/14  Yes Almyra Deforest, PA    latanoprost (XALATAN) 0.005 % ophthalmic solution Place 1 drop into both eyes at bedtime.  08/23/14  Yes Historical Provider, MD  Melatonin 5 MG TABS Take 5 mg by mouth at bedtime as needed.   Yes Historical Provider, MD  metoprolol (LOPRESSOR) 100 MG tablet Take 1 tablet (100 mg total) by mouth 2 (two) times daily. 11/12/14  Yes Almyra Deforest, PA  pentoxifylline (TRENTAL) 400 MG CR tablet TAKE 1 TABLET (400 MG TOTAL)  BY MOUTH 2 (TWO) TIMES DAILY AT 10 AMAND  5 PM. 03/28/14  Yes Marletta Lor, MD  timolol (TIMOPTIC) 0.25 % ophthalmic solution Place 1 drop into both eyes daily.  08/23/14  Yes Historical Provider, MD  Lesage test strip USE TWICE DAILY AS NEEDED Patient not taking: Reported on 12/29/2014 06/09/14   Marletta Lor, MD  ACCU-CHEK SOFTCLIX LANCETS lancets 1 each by Other route 2 (two) times daily as needed for other. Patient not taking: Reported on 12/29/2014 05/29/13   Marletta Lor, MD  Alcohol  Swabs (B-D SINGLE USE SWABS REGULAR) PADS USE AS DIRECTED Patient not taking: Reported on 12/29/2014 05/29/13   Marletta Lor, MD  Blood Glucose Calibration (ACCU-CHEK AVIVA) SOLN USE AS DIRECTED 05/29/13   Marletta Lor, MD  Blood Glucose Monitoring Suppl (ACCU-CHEK AVIVA PLUS) W/DEVICE KIT USE AS DIRECTED 09/24/13   Marletta Lor, MD  Insulin Pen Needle (RELION PEN NEEDLES) 32G X 4 MM MISC 1 each by Does not apply route 2 (two) times daily as needed. 10/08/13   Marletta Lor, MD  nitroGLYCERIN (NITROSTAT) 0.4 MG SL tablet Place 0.4 mg under the tongue every 5 (five) minutes as needed for chest pain (3 doses MAX).    Historical Provider, MD  SYRINGE-NEEDLE, DISP, 3 ML 25G X 1" 3 ML MISC 1 each by Does not apply route every 14 (fourteen) days. 06/27/11   Marletta Lor, MD    Inpatient Medications:     Allergies: No Known Allergies  Social History   Social History  . Marital Status: Widowed    Spouse Name: N/A  . Number of Children: N/A  .  Years of Education: N/A   Occupational History  . Not on file.   Social History Main Topics  . Smoking status: Former Smoker -- 0.50 packs/day for 20 years    Types: Cigarettes  . Smokeless tobacco: Never Used     Comment: "quit smoking cigarettes in the 1970's  . Alcohol Use: No  . Drug Use: No  . Sexual Activity: Not Currently   Other Topics Concern  . Not on file   Social History Narrative     Family History  Problem Relation Age of Onset  . Diabetes      brothers and sisters  . Hypertension Sister   . Coronary artery disease    . Stroke Mother   . Hypertension Mother   . Cancer Mother   . Heart attack Sister      Review of Systems: General: negative for chills, fever, night sweats or weight changes.  Cardiovascular:see above Dermatological: negative for rash Respiratory: negative for cough or wheezing Urologic: negative for hematuria Abdominal: negative for nausea, vomiting, diarrhea, bright red blood per rectum, melena, or hematemesis Neurologic: negative for visual changes, syncope, or dizziness All other systems reviewed and are otherwise negative except as noted above.  Labs: Troponins as above  Lab Results  Component Value Date   WBC 5.8 12/29/2014   HGB 10.7* 12/29/2014   HCT 33.5* 12/29/2014   MCV 87.7 12/29/2014   PLT 219 12/29/2014    Recent Labs Lab 12/29/14 1146  NA 142  K 3.7  CL 107  CO2 26  BUN 37*  CREATININE 2.99*  CALCIUM 9.4  PROT 6.1*  BILITOT 0.6  ALKPHOS 69  ALT 47  AST 33  GLUCOSE 153*   Lab Results  Component Value Date   CHOL 118 11/07/2014   HDL 37* 11/07/2014   LDLCALC 55 11/07/2014   TRIG 129 11/07/2014   No results found for: DDIMER  Radiology/Studies:  Dg Chest 2 View  12/29/2014  CLINICAL DATA:  Shortness of breath EXAM: CHEST  2 VIEW COMPARISON:  11/06/2014 chest radiograph FINDINGS: Median sternotomy wires are aligned and intact. Stable cardiomediastinal silhouette with mild cardiomegaly. No  pneumothorax. New small bilateral pleural effusions. Hyperinflated lungs. Patchy bibasilar lung opacities. IMPRESSION: 1. New small bilateral pleural effusions. 2. Patchy bibasilar lung opacities, which are nonspecific and could represent aspiration, pneumonia and/or atelectasis. 3. Stable mild cardiomegaly without  pulmonary edema. 4. Hyperinflated lungs, suggesting underlying COPD. Electronically Signed   By: Ilona Sorrel M.D.   On: 12/29/2014 12:21    Wt Readings from Last 3 Encounters:  12/29/14 167 lb (75.751 kg)  12/25/14 165 lb (74.844 kg)  12/16/14 165 lb (74.844 kg)    EKG: SB 56bpm, probable left atrial enlargement, LAFB, TWI I, avL, V6, ST depression no longer present  Physical Exam: Blood pressure 152/71, pulse 47, temperature 97.6 F (36.4 C), temperature source Oral, resp. rate 15, height '5\' 11"'  (1.803 m), weight 167 lb (75.751 kg), SpO2 100 %. General: Well developed, well nourished AAM, in no acute distress. Head: Normocephalic, atraumatic, sclera non-icteric, no xanthomas, nares are without discharge.  Neck: Negative for carotid bruits. JVD mildly elevated. Lungs: Slightly diminished BS at bases, otherwise clar bilaterally to auscultation without wheezes, rales, or rhonchi. Breathing is unlabored. Heart: RRR with S1 S2. No murmurs, rubs, or gallops appreciated. Abdomen: Soft, non-tender, non-distended with normoactive bowel sounds. No hepatomegaly. No rebound/guarding. No obvious abdominal masses. Msk:  Strength and tone appear normal for age. Extremities: No clubbing or cyanosis. No edema.  Distal pedal pulses are 2+ and equal bilaterally. Neuro: Alert and oriented X 3. No facial asymmetry. No focal deficit. Moves all extremities spontaneously. Psych:  Responds to questions appropriately with a normal affect.    Assessment and Plan:  1. Dyspnea suspected due to acute on chronic systolic CHF - patient reports increased dyspnea over 1 week, has increased JVD on exam and  small effusions on CXR. Will initiate IV Lasix 67m BID with careful monitoring of renal function (just got first dose by EDP a short while ago).  2. Elevated troponin - possibly related to demand ischemia in the setting of above. He denies recurrent chest pain. ST depressions from prior EKG have resolved. Will cycle troponins and continue current medical therapy. Per d/w MD, hold off on full dose IV heparin unless the next troponin jumps up further. If troponins begin to climb significantly, we will need to consider repeat cath this admission as previously suggested for possible PCI.  3. CAD - see above.  4. CKD stage IV - Cr slightly improved from September 2016. Follow closely with diuresis.  5. Sinus bradycardia - HR intermittently in the low 40s in the ER - will decrease metoprolol to 545mBID.  SiRaechel AcheA-C 12/29/2014, 4:21 PM Pager: [phone]

## 2014-12-29 NOTE — ED Notes (Signed)
MD at bedside. 

## 2014-12-29 NOTE — ED Notes (Signed)
Family at bedside. 

## 2014-12-29 NOTE — ED Notes (Signed)
MD at bedside.schlossman

## 2014-12-29 NOTE — ED Notes (Signed)
Meal tray ordered 

## 2014-12-30 DIAGNOSIS — E1121 Type 2 diabetes mellitus with diabetic nephropathy: Secondary | ICD-10-CM | POA: Diagnosis not present

## 2014-12-30 DIAGNOSIS — I5023 Acute on chronic systolic (congestive) heart failure: Secondary | ICD-10-CM | POA: Diagnosis not present

## 2014-12-30 DIAGNOSIS — I255 Ischemic cardiomyopathy: Secondary | ICD-10-CM | POA: Diagnosis not present

## 2014-12-30 DIAGNOSIS — E785 Hyperlipidemia, unspecified: Secondary | ICD-10-CM | POA: Diagnosis not present

## 2014-12-30 DIAGNOSIS — N184 Chronic kidney disease, stage 4 (severe): Secondary | ICD-10-CM | POA: Diagnosis not present

## 2014-12-30 DIAGNOSIS — I25810 Atherosclerosis of coronary artery bypass graft(s) without angina pectoris: Secondary | ICD-10-CM | POA: Diagnosis not present

## 2014-12-30 DIAGNOSIS — E1122 Type 2 diabetes mellitus with diabetic chronic kidney disease: Secondary | ICD-10-CM | POA: Diagnosis not present

## 2014-12-30 DIAGNOSIS — I2510 Atherosclerotic heart disease of native coronary artery without angina pectoris: Secondary | ICD-10-CM | POA: Diagnosis not present

## 2014-12-30 DIAGNOSIS — I25709 Atherosclerosis of coronary artery bypass graft(s), unspecified, with unspecified angina pectoris: Secondary | ICD-10-CM

## 2014-12-30 DIAGNOSIS — I129 Hypertensive chronic kidney disease with stage 1 through stage 4 chronic kidney disease, or unspecified chronic kidney disease: Secondary | ICD-10-CM | POA: Diagnosis not present

## 2014-12-30 DIAGNOSIS — R7989 Other specified abnormal findings of blood chemistry: Secondary | ICD-10-CM | POA: Diagnosis not present

## 2014-12-30 DIAGNOSIS — R0600 Dyspnea, unspecified: Secondary | ICD-10-CM | POA: Diagnosis not present

## 2014-12-30 LAB — BASIC METABOLIC PANEL
ANION GAP: 8 (ref 5–15)
Anion gap: 12 (ref 5–15)
BUN: 35 mg/dL — ABNORMAL HIGH (ref 6–20)
BUN: 35 mg/dL — ABNORMAL HIGH (ref 6–20)
CALCIUM: 9.6 mg/dL (ref 8.9–10.3)
CHLORIDE: 98 mmol/L — AB (ref 101–111)
CO2: 26 mmol/L (ref 22–32)
CO2: 28 mmol/L (ref 22–32)
CREATININE: 2.94 mg/dL — AB (ref 0.61–1.24)
Calcium: 9.1 mg/dL (ref 8.9–10.3)
Chloride: 104 mmol/L (ref 101–111)
Creatinine, Ser: 2.84 mg/dL — ABNORMAL HIGH (ref 0.61–1.24)
GFR calc non Af Amer: 19 mL/min — ABNORMAL LOW (ref >60)
GFR, EST AFRICAN AMERICAN: 22 mL/min — AB (ref >60)
GFR, EST AFRICAN AMERICAN: 22 mL/min — AB (ref >60)
GFR, EST NON AFRICAN AMERICAN: 19 mL/min — AB (ref >60)
Glucose, Bld: 121 mg/dL — ABNORMAL HIGH (ref 65–99)
Glucose, Bld: 162 mg/dL — ABNORMAL HIGH (ref 65–99)
POTASSIUM: 3.3 mmol/L — AB (ref 3.5–5.1)
Potassium: 4 mmol/L (ref 3.5–5.1)
SODIUM: 136 mmol/L (ref 135–145)
SODIUM: 140 mmol/L (ref 135–145)

## 2014-12-30 LAB — GLUCOSE, CAPILLARY
GLUCOSE-CAPILLARY: 112 mg/dL — AB (ref 65–99)
GLUCOSE-CAPILLARY: 142 mg/dL — AB (ref 65–99)
GLUCOSE-CAPILLARY: 172 mg/dL — AB (ref 65–99)
Glucose-Capillary: 97 mg/dL (ref 65–99)
Glucose-Capillary: 176 mg/dL — ABNORMAL HIGH (ref 65–99)

## 2014-12-30 LAB — TROPONIN I
TROPONIN I: 0.09 ng/mL — AB (ref <0.031)
TROPONIN I: 0.1 ng/mL — AB (ref <0.031)

## 2014-12-30 MED ORDER — FUROSEMIDE 10 MG/ML IJ SOLN
40.0000 mg | Freq: Two times a day (BID) | INTRAMUSCULAR | Status: AC
  Administered 2014-12-30: 40 mg via INTRAVENOUS
  Filled 2014-12-30: qty 4

## 2014-12-30 MED ORDER — CARVEDILOL 12.5 MG PO TABS
12.5000 mg | ORAL_TABLET | Freq: Two times a day (BID) | ORAL | Status: DC
  Administered 2014-12-30 (×2): 12.5 mg via ORAL
  Filled 2014-12-30 (×2): qty 1

## 2014-12-30 MED ORDER — POTASSIUM CHLORIDE CRYS ER 20 MEQ PO TBCR
40.0000 meq | EXTENDED_RELEASE_TABLET | Freq: Once | ORAL | Status: AC
  Administered 2014-12-30: 40 meq via ORAL
  Filled 2014-12-30: qty 2

## 2014-12-30 NOTE — Progress Notes (Signed)
Pharmacist Heart Failure Core Measure Documentation  Assessment: [name] has an EF documented as 40-45% on 11/07/2014 by ECHO.  Rationale: Heart failure patients with left ventricular systolic dysfunction (LVSD) and an EF < 40% should be prescribed an angiotensin converting enzyme inhibitor (ACEI) or angiotensin receptor blocker (ARB) at discharge unless a contraindication is documented in the medical record.  This patient is not currently on an ACEI or ARB for HF.  This note is being placed in the record in order to provide documentation that a contraindication to the use of these agents is present for this encounter.  ACE Inhibitor or Angiotensin Receptor Blocker is contraindicated (specify all that apply)  []   ACEI allergy AND ARB allergy []   Angioedema []   Moderate or severe aortic stenosis []   Hyperkalemia []   Hypotension []   Renal artery stenosis [x]   Worsening renal function, preexisting renal disease or dysfunction   Deboraha Sprang 12/30/2014 11:59 AM

## 2014-12-30 NOTE — Care Management Note (Signed)
Case Management Note  Patient Details  Name: TRAVON GUANDIQUE MRN: [id number] Date of Birth: February 25, 1932  Subjective/Objective:                    Action/Plan: Patient was admitted with SOB, elevated troponin, complex cardiac hx recently. Lives at home alone. Will follow for discharge needs.  Expected Discharge Date:                  Expected Discharge Plan:  [place]  In-House Referral:     Discharge planning Services  CM Consult  Post Acute Care Choice:    Choice offered to:     DME Arranged:    DME Agency:     HH Arranged:    [place] Agency:     Status of Service:  In process, will continue to follow  Medicare Important Message Given:    Date Medicare IM Given:    Medicare IM give by:    Date Additional Medicare IM Given:    Additional Medicare Important Message give by:     If discussed at [place] of Stay Meetings, dates discussed:    Additional Comments:  Rolm Baptise, RN 12/30/2014, 2:23 PM

## 2014-12-30 NOTE — Progress Notes (Signed)
PATIENT ID:  81 y/o M with history of CAD (s/p CABG 2003), CKD stage IV, HTN, DM with nephropathy, HLD, arthritis, peptic ulcer disease (0000000), chronic systolic CHF (EF [id number] in 10/2014 - previously normal in 08/2014), chronic anemia who presented with SOB and elevated troponin.  INTERVAL HISTORY: Troponin flat.  -1.5L yesterday.  SUBJECTIVE:  Feeling better.  Has not been walking yet.   PHYSICAL EXAM Filed Vitals:   12/30/14 0500 12/30/14 0600 12/30/14 0700 12/30/14 0751  BP: 135/57 143/64 146/70   Pulse: 61 51 55   Temp:    97.6 F (36.4 C)  TempSrc:    Oral  Resp: 18 22 17    Height:      Weight:      SpO2: 96% 97% 99%    General:  Well-appearing.  NAD. Neck: JVP 1-2 cm above clavicle at 45 degrees Lungs:  Mildly diminished at bases Heart:  RRR.  No m/r/g.  Nl S1/S2 Abdomen:  Soft, NT, ND.  +BS Extremities:  WWP. No edema  LABS: Lab Results  Component Value Date   TROPONINI 0.10* 12/30/2014   Results for orders placed or performed during the hospital encounter of 12/29/14 (from the past 24 hour(s))  Brain natriuretic peptide     Status: Abnormal   Collection Time: 12/29/14 11:40 AM  Result Value Ref Range   B Natriuretic Peptide 1114.2 (H) 0.0 - 100.0 pg/mL  CBC WITH DIFFERENTIAL     Status: Abnormal   Collection Time: 12/29/14 11:46 AM  Result Value Ref Range   WBC 5.8 4.0 - 10.5 K/uL   RBC 3.82 (L) 4.22 - 5.81 MIL/uL   Hemoglobin 10.7 (L) 13.0 - 17.0 g/dL   HCT 33.5 (L) 39.0 - 52.0 %   MCV 87.7 78.0 - 100.0 fL   MCH 28.0 26.0 - 34.0 pg   MCHC 31.9 30.0 - 36.0 g/dL   RDW 14.9 11.5 - 15.5 %   Platelets 219 150 - 400 K/uL   Neutrophils Relative % 65 %   Neutro Abs 3.8 1.7 - 7.7 K/uL   Lymphocytes Relative 20 %   Lymphs Abs 1.2 0.7 - 4.0 K/uL   Monocytes Relative 8 %   Monocytes Absolute 0.4 0.1 - 1.0 K/uL   Eosinophils Relative 6 %   Eosinophils Absolute 0.3 0.0 - 0.7 K/uL   Basophils Relative 1 %   Basophils Absolute 0.0 0.0 - 0.1 K/uL    Comprehensive metabolic panel     Status: Abnormal   Collection Time: 12/29/14 11:46 AM  Result Value Ref Range   Sodium 142 135 - 145 mmol/L   Potassium 3.7 3.5 - 5.1 mmol/L   Chloride 107 101 - 111 mmol/L   CO2 26 22 - 32 mmol/L   Glucose, Bld 153 (H) 65 - 99 mg/dL   BUN 37 (H) 6 - 20 mg/dL   Creatinine, Ser 2.99 (H) 0.61 - 1.24 mg/dL   Calcium 9.4 8.9 - 10.3 mg/dL   Total Protein 6.1 (L) 6.5 - 8.1 g/dL   Albumin 3.5 3.5 - 5.0 g/dL   AST 33 15 - 41 U/L   ALT 47 17 - 63 U/L   Alkaline Phosphatase 69 38 - 126 U/L   Total Bilirubin 0.6 0.3 - 1.2 mg/dL   GFR calc non Af Amer 18 (L) >60 mL/min   GFR calc Af Amer 21 (L) >60 mL/min   Anion gap 9 5 - 15  I-Stat Troponin, ED - 0,  3, 6 hours (not at [facility])     Status: None   Collection Time: 12/29/14 11:48 AM  Result Value Ref Range   Troponin i, poc 0.04 0.00 - 0.08 ng/mL   Comment 3          I-Stat Troponin, ED - 0, 3, 6 hours (not at [facility])     Status: Abnormal   Collection Time: 12/29/14  2:51 PM  Result Value Ref Range   Troponin i, poc 0.14 (HH) 0.00 - 0.08 ng/mL   Comment 3          Troponin I     Status: Abnormal   Collection Time: 12/29/14  3:51 PM  Result Value Ref Range   Troponin I 0.09 (H) <0.031 ng/mL  MRSA PCR Screening     Status: None   Collection Time: 12/29/14  7:27 PM  Result Value Ref Range   MRSA by PCR NEGATIVE NEGATIVE  Troponin I     Status: Abnormal   Collection Time: 12/29/14  7:36 PM  Result Value Ref Range   Troponin I 0.10 (H) <0.031 ng/mL  TSH     Status: None   Collection Time: 12/29/14  7:42 PM  Result Value Ref Range   TSH 2.846 0.350 - 4.500 uIU/mL  Troponin I     Status: Abnormal   Collection Time: 12/30/14 12:45 AM  Result Value Ref Range   Troponin I 0.10 (H) <0.031 ng/mL    Intake/Output Summary (Last 24 hours) at 12/30/14 0812 Last data filed at 12/30/14 0644  Gross per 24 hour  Intake    240 ml  Output   1775 ml  Net  -1535 ml    EKG:  Sinus bradycardia rate 56 bpm.  LAFB.   Non-specific t wave abnormalities.  ASSESSMENT AND PLAN:  # Acute on chronic systolic heart failure: Dyspnea likely due to acute on chronic systolic heart failure.  BNP 1110.  He is negative 1.5L since admission.  Weight is down 2kg.  Home dose of lasix is 40 mg bid.  May need higher dose to maintain euvolemia. - Continue lasix 40mg  IV bid today.  Switch to oral lasix tomorrow at 60 mg bid.  # CAD: Tropononin flat.  The POC showed an increase but the full lab is mildly elevated, consistent with the level of his chronically elevated troponin. - Continue aspirin, atorvastatin, plavix, carvedilol and Imdur  # Hypertension: BP remains slightly above goal. - continue amlodipine and clonidine - switch metoprolol to carvedilol  # CKD IV: Renal function stable with diuresis.  Continue to monitor closely.  # Sinus bradycardia: Mr. Ewert remains asymptomatic.  Metoprolol was decreased to 50 mg from 100 mg with improvement in his heart rate from the 40s to the 50s.  However, he BP is poorly controlled. - Switch metoprolol to carvedilol 12.5 mg bid  Active Problems:   Elevated troponin   Ischemic cardiomyopathy   Acute on chronic systolic CHF (congestive heart failure) (HCC)   CKD (chronic kidney disease), stage IV (HCC)   CAD (coronary artery disease) of bypass graft   Dyspnea   Sinus bradycardia    Sharol Harness, MD 12/30/2014 8:12 AM

## 2014-12-31 ENCOUNTER — Ambulatory Visit: Payer: Commercial Managed Care - HMO | Admitting: Internal Medicine

## 2014-12-31 DIAGNOSIS — I25810 Atherosclerosis of coronary artery bypass graft(s) without angina pectoris: Secondary | ICD-10-CM | POA: Diagnosis not present

## 2014-12-31 DIAGNOSIS — E1122 Type 2 diabetes mellitus with diabetic chronic kidney disease: Secondary | ICD-10-CM | POA: Diagnosis not present

## 2014-12-31 DIAGNOSIS — I5023 Acute on chronic systolic (congestive) heart failure: Secondary | ICD-10-CM | POA: Diagnosis not present

## 2014-12-31 DIAGNOSIS — N184 Chronic kidney disease, stage 4 (severe): Secondary | ICD-10-CM | POA: Diagnosis not present

## 2014-12-31 DIAGNOSIS — I129 Hypertensive chronic kidney disease with stage 1 through stage 4 chronic kidney disease, or unspecified chronic kidney disease: Secondary | ICD-10-CM | POA: Diagnosis not present

## 2014-12-31 DIAGNOSIS — I255 Ischemic cardiomyopathy: Secondary | ICD-10-CM | POA: Diagnosis not present

## 2014-12-31 DIAGNOSIS — R7989 Other specified abnormal findings of blood chemistry: Secondary | ICD-10-CM | POA: Diagnosis not present

## 2014-12-31 DIAGNOSIS — R0600 Dyspnea, unspecified: Secondary | ICD-10-CM | POA: Diagnosis not present

## 2014-12-31 DIAGNOSIS — I25709 Atherosclerosis of coronary artery bypass graft(s), unspecified, with unspecified angina pectoris: Secondary | ICD-10-CM | POA: Diagnosis not present

## 2014-12-31 DIAGNOSIS — E1121 Type 2 diabetes mellitus with diabetic nephropathy: Secondary | ICD-10-CM | POA: Diagnosis not present

## 2014-12-31 DIAGNOSIS — I2510 Atherosclerotic heart disease of native coronary artery without angina pectoris: Secondary | ICD-10-CM | POA: Diagnosis not present

## 2014-12-31 DIAGNOSIS — E785 Hyperlipidemia, unspecified: Secondary | ICD-10-CM | POA: Diagnosis not present

## 2014-12-31 LAB — BASIC METABOLIC PANEL
Anion gap: 10 (ref 5–15)
BUN: 38 mg/dL — AB (ref 6–20)
CO2: 29 mmol/L (ref 22–32)
CREATININE: 3.14 mg/dL — AB (ref 0.61–1.24)
Calcium: 9.6 mg/dL (ref 8.9–10.3)
Chloride: 101 mmol/L (ref 101–111)
GFR calc Af Amer: 20 mL/min — ABNORMAL LOW (ref >60)
GFR, EST NON AFRICAN AMERICAN: 17 mL/min — AB (ref >60)
GLUCOSE: 166 mg/dL — AB (ref 65–99)
POTASSIUM: 3.6 mmol/L (ref 3.5–5.1)
SODIUM: 140 mmol/L (ref 135–145)

## 2014-12-31 LAB — GLUCOSE, CAPILLARY
GLUCOSE-CAPILLARY: 135 mg/dL — AB (ref 65–99)
GLUCOSE-CAPILLARY: 158 mg/dL — AB (ref 65–99)
GLUCOSE-CAPILLARY: 119 mg/dL — AB (ref 65–99)
Glucose-Capillary: 149 mg/dL — ABNORMAL HIGH (ref 65–99)

## 2014-12-31 MED ORDER — CARVEDILOL 25 MG PO TABS
25.0000 mg | ORAL_TABLET | Freq: Two times a day (BID) | ORAL | Status: DC
  Administered 2014-12-31 – 2015-01-01 (×3): 25 mg via ORAL
  Filled 2014-12-31 (×3): qty 1

## 2014-12-31 MED ORDER — FUROSEMIDE 40 MG PO TABS
60.0000 mg | ORAL_TABLET | Freq: Two times a day (BID) | ORAL | Status: DC
  Administered 2014-12-31 – 2015-01-01 (×2): 60 mg via ORAL
  Filled 2014-12-31 (×2): qty 1

## 2014-12-31 NOTE — Care Management Obs Status (Signed)
[place] NOTIFICATION   Patient Details  Name: [name] MRN: [id number] Date of Birth: [birth year]   Medicare Observation Status Notification Given:  Yes    Rolm Baptise, RN 12/31/2014, 4:43 PM

## 2014-12-31 NOTE — Consult Note (Signed)
   [facility] Inpatient Consult   12/31/2014  IZEA LIVOLSI 10/16/1931 [id number] Patient is currently active with [facility] for chronic disease management services.  Patient has been engaged by a [employer].  Our community based plan of care has focused on disease management and community resource support. Met with the patient at bedside.  Patient desires ongoing care management services.  Patient states that he will be on new medication.  Patient will receive a post discharge transition of care call and will be evaluated for monthly home visits for assessments and disease process education. Spoke with Inpatient Case Manager, Loma Sousa, and aware that [place] Management following. Of note, [facility] Care Management services does not replace or interfere with any services that are arranged by inpatient case management or social work.  For additional questions or referrals please contact: Natividad Brood, RN BSN [facility] Liaison  [phone] business mobile phone

## 2014-12-31 NOTE — Progress Notes (Signed)
PATIENT ID:  81 y/o M with history of CAD (s/p CABG 2003), CKD stage IV, HTN, DM with nephropathy, HLD, arthritis, peptic ulcer disease (0000000), chronic systolic CHF (EF [id number] in 10/2014 - previously normal in 08/2014), chronic anemia who presented with SOB and elevated troponin.  INTERVAL HISTORY: -415 mL yesterday.  SUBJECTIVE:  Feels well.  Wants to go home.  No shortness of breath or chest pain.   PHYSICAL EXAM Filed Vitals:   12/30/14 2113 12/30/14 2340 12/31/14 0332 12/31/14 0818  BP: 129/65 120/70 126/70 145/71  Pulse: 61 73 76 78  Temp:  97.5 F (36.4 C) 98.2 F (36.8 C) 97.3 F (36.3 C)  TempSrc:  Oral Oral Oral  Resp: 15 16 18 18   Height:      Weight:   72.7 kg (160 lb 4.4 oz)   SpO2:  98% 97% 98%   General:  Well-appearing.  NAD. Neck: JVP at clavicle at 45 degrees Lungs:  CTAB. Heart:  RRR.  No m/r/g.  Nl S1/S2 Abdomen:  Soft, NT, ND.  +BS Extremities:  WWP. No edema  LABS: Lab Results  Component Value Date   TROPONINI 0.09* 12/30/2014   Results for orders placed or performed during the hospital encounter of 12/29/14 (from the past 24 hour(s))  Basic metabolic panel     Status: Abnormal   Collection Time: 12/30/14 12:07 PM  Result Value Ref Range   Sodium 140 135 - 145 mmol/L   Potassium 4.0 3.5 - 5.1 mmol/L   Chloride 104 101 - 111 mmol/L   CO2 28 22 - 32 mmol/L   Glucose, Bld 162 (H) 65 - 99 mg/dL   BUN 35 (H) 6 - 20 mg/dL   Creatinine, Ser 2.94 (H) 0.61 - 1.24 mg/dL   Calcium 9.6 8.9 - 10.3 mg/dL   GFR calc non Af Amer 19 (L) >60 mL/min   GFR calc Af Amer 22 (L) >60 mL/min   Anion gap 8 5 - 15  Glucose, capillary     Status: Abnormal   Collection Time: 12/30/14 12:20 PM  Result Value Ref Range   Glucose-Capillary 142 (H) 65 - 99 mg/dL   Comment 1 Capillary Specimen   Glucose, capillary     Status: Abnormal   Collection Time: 12/30/14  4:51 PM  Result Value Ref Range   Glucose-Capillary 172 (H) 65 - 99 mg/dL  Glucose, capillary      Status: Abnormal   Collection Time: 12/30/14  8:47 PM  Result Value Ref Range   Glucose-Capillary 135 (H) 65 - 99 mg/dL   Comment 1 Notify RN   Basic metabolic panel     Status: Abnormal   Collection Time: 12/31/14  2:54 AM  Result Value Ref Range   Sodium 140 135 - 145 mmol/L   Potassium 3.6 3.5 - 5.1 mmol/L   Chloride 101 101 - 111 mmol/L   CO2 29 22 - 32 mmol/L   Glucose, Bld 166 (H) 65 - 99 mg/dL   BUN 38 (H) 6 - 20 mg/dL   Creatinine, Ser 3.14 (H) 0.61 - 1.24 mg/dL   Calcium 9.6 8.9 - 10.3 mg/dL   GFR calc non Af Amer 17 (L) >60 mL/min   GFR calc Af Amer 20 (L) >60 mL/min   Anion gap 10 5 - 15    Intake/Output Summary (Last 24 hours) at 12/31/14 0820 Last data filed at 12/31/14 0700  Gross per 24 hour  Intake  960 ml  Output   1575 ml  Net   -615 ml    EKG:  Sinus bradycardia rate 56 bpm.  LAFB.  Non-specific t wave abnormalities.  Telemetry: 4 beats of NSVT.  Multiform PVCs.  Echo 11/07/14: Study Conclusions  - Left ventricle: The cavity size was normal. Wall thickness was normal. Systolic function was mildly to moderately reduced. The estimated ejection fraction was in the range of 40% to 45%. Akinesis of the inferior myocardium. - Mitral valve: There was mild regurgitation. - Pulmonary arteries: Systolic pressure was moderately increased. PA peak pressure: 46 mm Hg (S).  ASSESSMENT AND PLAN:  # Acute on chronic systolic heart failure: LVEF 40-45% on 10/2014.  Dyspnea likely due to acute on chronic systolic heart failure.  BNP 1110 on admission.  He is negative 2 L since admission.  Weight is not assessed yet today.  Creatinine slightly increased today.  He appears euvolemic on exam.  Home dose of lasix is 40 mg bid.   - Will start lasix 60 mg po bid this afternoon. - Increased carvedilol to 25 mg bid - No ACE-I/ARB due to acute on chronic renal failure.  # CAD: Tropononin flat.  The POC showed an increase but the full lab is mildly elevated,  consistent with the level of his chronically elevated troponin. - Continue aspirin, atorvastatin, plavix, carvedilol and Imdur  # Hypertension: BP remains slightly above goal. - continue amlodipine and clonidine - Increase carvedilol to 25 mg bid.  # CKD IV: Renal function starting to rise with diuresis.  Will hold on IV diuretic and switch to oral today as above.  # Sinus bradycardia: Mr. Viverito remains asymptomatic.  Metoprolol was decreased to 50 mg from 100 mg with improvement in his heart rate from the 40s to the 50s.  However, he BP was poorly controlled.  -  - Switched to coreg with improvement in both heart rate and BP.  Will increase to 25 mg today.   Active Problems:   Elevated troponin   Ischemic cardiomyopathy   Acute on chronic systolic CHF (congestive heart failure) (HCC)   CKD (chronic kidney disease), stage IV (HCC)   CAD (coronary artery disease) of bypass graft   Dyspnea   Sinus bradycardia    Sharol Harness, MD 12/31/2014 8:20 AM

## 2015-01-01 ENCOUNTER — Telehealth: Payer: Self-pay | Admitting: Nurse Practitioner

## 2015-01-01 ENCOUNTER — Other Ambulatory Visit: Payer: Self-pay | Admitting: Physician Assistant

## 2015-01-01 DIAGNOSIS — R0600 Dyspnea, unspecified: Secondary | ICD-10-CM

## 2015-01-01 DIAGNOSIS — I5023 Acute on chronic systolic (congestive) heart failure: Secondary | ICD-10-CM

## 2015-01-01 DIAGNOSIS — I255 Ischemic cardiomyopathy: Secondary | ICD-10-CM | POA: Diagnosis not present

## 2015-01-01 DIAGNOSIS — E1122 Type 2 diabetes mellitus with diabetic chronic kidney disease: Secondary | ICD-10-CM | POA: Diagnosis not present

## 2015-01-01 DIAGNOSIS — I129 Hypertensive chronic kidney disease with stage 1 through stage 4 chronic kidney disease, or unspecified chronic kidney disease: Secondary | ICD-10-CM | POA: Diagnosis not present

## 2015-01-01 DIAGNOSIS — I25810 Atherosclerosis of coronary artery bypass graft(s) without angina pectoris: Secondary | ICD-10-CM | POA: Diagnosis not present

## 2015-01-01 DIAGNOSIS — R7989 Other specified abnormal findings of blood chemistry: Secondary | ICD-10-CM | POA: Diagnosis not present

## 2015-01-01 DIAGNOSIS — E1121 Type 2 diabetes mellitus with diabetic nephropathy: Secondary | ICD-10-CM | POA: Diagnosis not present

## 2015-01-01 DIAGNOSIS — I2510 Atherosclerotic heart disease of native coronary artery without angina pectoris: Secondary | ICD-10-CM | POA: Diagnosis not present

## 2015-01-01 DIAGNOSIS — I25709 Atherosclerosis of coronary artery bypass graft(s), unspecified, with unspecified angina pectoris: Secondary | ICD-10-CM | POA: Diagnosis not present

## 2015-01-01 DIAGNOSIS — E785 Hyperlipidemia, unspecified: Secondary | ICD-10-CM | POA: Diagnosis not present

## 2015-01-01 DIAGNOSIS — N184 Chronic kidney disease, stage 4 (severe): Secondary | ICD-10-CM | POA: Diagnosis not present

## 2015-01-01 LAB — GLUCOSE, CAPILLARY
GLUCOSE-CAPILLARY: 130 mg/dL — AB (ref 65–99)
Glucose-Capillary: 134 mg/dL — ABNORMAL HIGH (ref 65–99)

## 2015-01-01 LAB — BASIC METABOLIC PANEL
Anion gap: 9 (ref 5–15)
BUN: 33 mg/dL — ABNORMAL HIGH (ref 6–20)
CHLORIDE: 104 mmol/L (ref 101–111)
CO2: 27 mmol/L (ref 22–32)
Calcium: 9.5 mg/dL (ref 8.9–10.3)
Creatinine, Ser: 3.02 mg/dL — ABNORMAL HIGH (ref 0.61–1.24)
GFR calc Af Amer: 21 mL/min — ABNORMAL LOW (ref >60)
GFR, EST NON AFRICAN AMERICAN: 18 mL/min — AB (ref >60)
Glucose, Bld: 135 mg/dL — ABNORMAL HIGH (ref 65–99)
POTASSIUM: 3.6 mmol/L (ref 3.5–5.1)
Sodium: 140 mmol/L (ref 135–145)

## 2015-01-01 MED ORDER — CARVEDILOL 25 MG PO TABS: 25.0000 mg | ORAL_TABLET | Freq: Two times a day (BID) | ORAL | Status: DC

## 2015-01-01 MED ORDER — FUROSEMIDE 40 MG PO TABS: 60.0000 mg | ORAL_TABLET | Freq: Two times a day (BID) | ORAL | Status: DC

## 2015-01-01 MED ORDER — POTASSIUM CHLORIDE ER 20 MEQ PO TBCR: 10.0000 meq | EXTENDED_RELEASE_TABLET | Freq: Every day | ORAL | Status: DC

## 2015-01-01 MED ORDER — FUROSEMIDE 10 MG/ML IJ SOLN
INTRAMUSCULAR | Status: AC
  Administered 2015-01-01: 60 mg
  Filled 2015-01-01: qty 4

## 2015-01-01 MED ORDER — POTASSIUM CHLORIDE ER 20 MEQ PO TBCR: 20.0000 meq | EXTENDED_RELEASE_TABLET | Freq: Every day | ORAL | Status: DC

## 2015-01-01 MED ORDER — FUROSEMIDE 10 MG/ML IJ SOLN
INTRAMUSCULAR | Status: AC
Start: 2015-01-01 — End: 2015-01-01
  Filled 2015-01-01: qty 2

## 2015-01-01 NOTE — Discharge Instructions (Signed)
Heart Failure Heart failure means your heart has trouble pumping blood. This makes it hard for your body to work well. Heart failure is usually a long-term (chronic) condition. You must take good care of yourself and follow your doctor's treatment plan. HOME CARE  Take your heart medicine as told by your doctor.  Do not stop taking medicine unless your doctor tells you to.  Do not skip any dose of medicine.  Refill your medicines before they run out.  Take other medicines only as told by your doctor or pharmacist.  Stay active if told by your doctor. The elderly and people with severe heart failure should talk with a doctor about physical activity.  Eat heart-healthy foods. Choose foods that are without trans fat and are low in saturated fat, cholesterol, and salt (sodium). This includes fresh or frozen fruits and vegetables, fish, lean meats, fat-free or low-fat dairy foods, whole grains, and high-fiber foods. Lentils and dried peas and beans (legumes) are also good choices.  Limit salt if told by your doctor.  Cook in a healthy way. Roast, grill, broil, bake, poach, steam, or stir-fry foods.  Limit fluids as told by your doctor.  Weigh yourself every morning. Do this after you pee (urinate) and before you eat breakfast. Write down your weight to give to your doctor.  Take your blood pressure and write it down if your doctor tells you to.  Ask your doctor how to check your pulse. Check your pulse as told.  Lose weight if told by your doctor.  Stop smoking or chewing tobacco. Do not use gum or patches that help you quit without your doctor's approval.  Schedule and go to doctor visits as told.  Nonpregnant women should have no more than 1 drink a day. Men should have no more than 2 drinks a day. Talk to your doctor about drinking alcohol.  Stop illegal drug use.  Stay current with shots (immunizations).  Manage your health conditions as told by your doctor.  Learn to  manage your stress.  Rest when you are tired.  If it is really hot outside:  Avoid intense activities.  Use air conditioning or fans, or get in a cooler place.  Avoid caffeine and alcohol.  Wear loose-fitting, lightweight, and light-colored clothing.  If it is really cold outside:  Avoid intense activities.  Layer your clothing.  Wear mittens or gloves, a hat, and a scarf when going outside.  Avoid alcohol.  Learn about heart failure and get support as needed.  Get help to maintain or improve your quality of life and your ability to care for yourself as needed. GET HELP IF:   You gain weight quickly.  You are more short of breath than usual.  You cannot do your normal activities.  You tire easily.  You cough more than normal, especially with activity.  You have any or more puffiness (swelling) in areas such as your hands, feet, ankles, or belly (abdomen).  You cannot sleep because it is hard to breathe.  You feel like your heart is beating fast (palpitations).  You get dizzy or light-headed when you stand up. GET HELP RIGHT AWAY IF:   You have trouble breathing.  There is a change in mental status, such as becoming less alert or not being able to focus.  You have chest pain or discomfort.  You faint. MAKE SURE YOU:   Understand these instructions.  Will watch your condition.  Will get help right away if you  are not doing well or get worse.   This information is not intended to replace advice given to you by your health care provider. Make sure you discuss any questions you have with your health care provider.   Document Released: 11/24/2007 Document Revised: 03/07/2014 Document Reviewed: 04/02/2012 Elsevier Interactive Patient Education 2016 [employer]  Pleural Effusion A pleural effusion is an abnormal buildup of fluid in the layers of tissue between your lungs and the inside of your chest (pleural space). These two layers of tissue that line  both your lungs and the inside of your chest are called pleura. Usually, there is no air in the space between the pleura, only a thin layer of fluid. If left untreated, a large amount of fluid can build up and cause the lung to collapse. A pleural effusion is usually caused by another disease that requires treatment. The two main types of pleural effusion are:  Transudative pleural effusion. This happens when fluid leaks into the pleural space because of a low protein count in your blood or high blood pressure in your vessels. Heart failure often causes this.  Exudative infusion. This occurs when fluid collects in the pleural space from blocked blood vessels or lymph vessels. Some lung diseases, injuries, and cancers can cause this type of effusion. CAUSES Pleural effusion can be caused by:  Heart failure.  A blood clot in the lung (pulmonary embolism).  Pneumonia.  Cancer.  Liver failure (cirrhosis).  Kidney disease.  Complications from surgery, such as from open heart surgery. SIGNS AND SYMPTOMS In some cases, pleural effusion may cause no symptoms. Symptoms can include:  Shortness of breath, especially when lying down.  Chest pain, often worse when taking a deep breath.  Fever.  Dry cough that is lasting (chronic).  Hiccups.  Rapid breathing. An underlying condition that is causing the pleural effusion (such as heart failure, pneumonia, blood clots, tuberculosis, or cancer) may also cause additional symptoms. DIAGNOSIS Your health care provider may suspect pleural effusion based on your symptoms and medical history. Your health care provider will also do a physical exam and a chest X-ray. If the X-ray shows there is fluid in your chest, you may need to have this fluid removed using a needle (thoracentesis) so it can be tested. You may also have:  Imaging studies of the chest, such as:  Ultrasound.  CT scan.  Blood tests for kidney and liver  function. TREATMENT Treatment depends on the cause of the pleural effusion. Treatment may include:  Taking antibiotic medicines to clear up an infection that is causing the pleural effusion.  Placing a tube in the chest to drain the effusion (tube thoracostomy). This procedure is often used when there is an infection in the fluid.  Surgery to remove the fibrous outer layer of tissue from the pleural space (decortication).  Thoracentesis, which can improve cough and shortness of breath.  A procedure to put medicine into the chest cavity to seal the pleural space to prevent fluid buildup (pleurodesis).  Chemotherapy and radiation therapy. These may be required in the case of cancerous (malignant) pleural effusion. HOME CARE INSTRUCTIONS  Take medicines only as directed by your health care provider.  Keep track of how long you can gently exercise before you get short of breath. Try simply walking at first.  Do not use any tobacco products, including cigarettes, chewing tobacco, or electronic cigarettes. If you need help quitting, ask your health care provider.  Keep all follow-up visits as directed by  your health care provider. This is important. SEEK MEDICAL CARE IF:  The amount of time that you are able to exercise decreases or does not improve with time.  You have pain or signs of infection at the puncture site if you had thoracentesis. Watch for:  Drainage.  Redness.  Swelling.  You have a fever. SEEK IMMEDIATE MEDICAL CARE IF:  You are short of breath.  You develop chest pain.  You develop a new cough. MAKE SURE YOU:  Understand these instructions.  Will watch your condition.  Will get help right away if you are not doing well or get worse.   This information is not intended to replace advice given to you by your health care provider. Make sure you discuss any questions you have with your health care provider.   Document Released: 02/14/2005 Document Revised:  03/07/2014 Document Reviewed: 07/10/2013 Elsevier Interactive Patient Education 2016 [employer]      Heart Failure Prevention:  1. Avoid salty food 2. Limit fluid intake to <2 Liter per day 3. Weigh yourself every morning, call cardiology if weight increase by more than 3 lbs overnight or 5 lbs in a single week.

## 2015-01-01 NOTE — Progress Notes (Addendum)
Discharge instructions reviewed with patient. Patient verbalized understanding. 

## 2015-01-01 NOTE — Progress Notes (Signed)
PATIENT ID:  81 y/o M with history of CAD (s/p CABG 2003), CKD stage IV, HTN, DM with nephropathy, HLD, arthritis, peptic ulcer disease (0000000), chronic systolic CHF (EF [id number] in 10/2014 - previously normal in 08/2014), chronic anemia who presented with SOB and elevated troponin.   INTERVAL HISTORY:  -930 mL yesterday with lasix 60 mg po x1.  Creatinine stable.  SUBJECTIVE:  Feels well.  Wants to go home.  No shortness of breath or chest pain.   PHYSICAL EXAM Filed Vitals:   12/31/14 2300 01/01/15 0356 01/01/15 0400 01/01/15 0750  BP: 131/70 140/69 140/69 138/74  Pulse:    67  Temp: 98 F (36.7 C) 98.3 F (36.8 C)  98 F (36.7 C)  TempSrc: Oral Oral  Oral  Resp: 16 16  16   Height:      Weight:  72.3 kg (159 lb 6.3 oz)    SpO2: 97% 98%  98%   General:  Well-appearing.  NAD. Neck: JVP at clavicle at 45 degrees Lungs:  CTAB. Heart:  RRR.  No m/r/g.  Nl S1/S2 Abdomen:  Soft, NT, ND.  +BS Extremities:  WWP. No edema  LABS: Lab Results  Component Value Date   TROPONINI 0.09* 12/30/2014   Results for orders placed or performed during the hospital encounter of 12/29/14 (from the past 24 hour(s))  Glucose, capillary     Status: Abnormal   Collection Time: 12/31/14 11:42 AM  Result Value Ref Range   Glucose-Capillary 158 (H) 65 - 99 mg/dL   Comment 1 Capillary Specimen   Glucose, capillary     Status: Abnormal   Collection Time: 12/31/14  5:31 PM  Result Value Ref Range   Glucose-Capillary 119 (H) 65 - 99 mg/dL  Basic metabolic panel     Status: Abnormal   Collection Time: 01/01/15  4:11 AM  Result Value Ref Range   Sodium 140 135 - 145 mmol/L   Potassium 3.6 3.5 - 5.1 mmol/L   Chloride 104 101 - 111 mmol/L   CO2 27 22 - 32 mmol/L   Glucose, Bld 135 (H) 65 - 99 mg/dL   BUN 33 (H) 6 - 20 mg/dL   Creatinine, Ser 3.02 (H) 0.61 - 1.24 mg/dL   Calcium 9.5 8.9 - 10.3 mg/dL   GFR calc non Af Amer 18 (L) >60 mL/min   GFR calc Af Amer 21 (L) >60 mL/min   Anion gap 9 5  - 15  Glucose, capillary     Status: Abnormal   Collection Time: 01/01/15  7:54 AM  Result Value Ref Range   Glucose-Capillary 130 (H) 65 - 99 mg/dL   Comment 1 Capillary Specimen     Intake/Output Summary (Last 24 hours) at 01/01/15 0828 Last data filed at 01/01/15 0752  Gross per 24 hour  Intake    520 ml  Output   1450 ml  Net   -930 ml    EKG:  Sinus bradycardia rate 56 bpm.  LAFB.  Non-specific t wave abnormalities.  Telemetry: 4 beats of NSVT.  Multiform PVCs.  Echo 11/07/14: Study Conclusions  - Left ventricle: The cavity size was normal. Wall thickness was normal. Systolic function was mildly to moderately reduced. The estimated ejection fraction was in the range of 40% to 45%. Akinesis of the inferior myocardium. - Mitral valve: There was mild regurgitation. - Pulmonary arteries: Systolic pressure was moderately increased. PA peak pressure: 46 mm Hg (S).  ASSESSMENT AND PLAN:  #  Acute on chronic systolic heart failure: LVEF 40-45% on 10/2014.  Dyspnea likely due to acute on chronic systolic heart failure.  BNP 1110 on admission.  He is negative 2 L since admission.  Weight is not assessed yet today.  Creatinine slightly increased today.  He appears euvolemic on exam.  Home dose of lasix is 40 mg bid.  Yesterday he was negative 1L with one dose of lasix 60 mg po.  Given that his creatinine is slightly up from admission, will discharge on 60 mg po daily with instructions to weigh himself daily.  If weight is up more than 2 lb in a day or 5 lb in a week, take 60 mg bid.  Will need BMP and follow up assessment in 1 week. Weight 72.2 from 72.7 admission. - Will start lasix 60 mg po bid this afternoon. - Increased carvedilol to 25 mg bid - No ACE-I/ARB due to acute on chronic renal failure.  # CAD: Tropononin flat.  The POC showed an increase but the full lab is mildly elevated, consistent with the level of his chronically elevated troponin. - Continue aspirin,  atorvastatin, plavix, carvedilol and Imdur  # Hypertension: BP now well-controlled.  - continue carvedilol, amlodipine and clonidine  # CKD IV: Renal function stable today after switching to oral diuretic yesterday.  It rose yesterday, so IV lasix was held.  Today his creatinine is 3 from 2.8 at admission, 3.1 yesterday.  # Sinus bradycardia: Mr. Dudziak remains asymptomatic.  Metoprolol was decreased to 50 mg from 100 mg with improvement in his heart rate from the 40s to the 50s.  However, he BP was poorly controlled.  -  - Switched to coreg with improvement in both heart rate and BP.  Will increase to 25 mg today.   Active Problems:   Elevated troponin   Ischemic cardiomyopathy   Acute on chronic systolic CHF (congestive heart failure) (HCC)   CKD (chronic kidney disease), stage IV (HCC)   CAD (coronary artery disease) of bypass graft   Dyspnea   Sinus bradycardia    Sharol Harness, MD 01/01/2015 8:28 AM

## 2015-01-01 NOTE — Discharge Summary (Signed)
Discharge Summary   Patient ID: [name],  MRN: [id number], DOB/AGE: 79/30/1933 79 y.o.  Admit date: 12/29/2014 Discharge date: 01/01/2015  Primary Care Provider: [name] Primary Cardiologist: Dr. Tamala Julian  Discharge Diagnoses Principal Problem:   Acute on chronic systolic CHF (congestive heart failure) [facility]) Active Problems:   Elevated troponin   Ischemic cardiomyopathy   CKD (chronic kidney disease), stage IV ([place])   CAD (coronary artery disease) of bypass graft   Dyspnea   Sinus bradycardia   Allergies No Known Allergies   Hospital Course  Male with past medical history of CAD (s/p CABG 2003), CKD stage IV, HTN, DM with nephropathy, HLD, arthritis, peptic ulcer disease (5726), chronic systolic CHF (EF 20-35% in 10/2014 - previously normal in 08/2014), chronic anemia who presented to [facility] on 12/29/2014 with shortness of breath. He had a very complicated history. He was admitted in July 2016 with unstable angina and worsening renal function. Nuclear stress test was high risk, however cardiac cath was not performed as patient refused to go on dialysis at the time if needed. He was readmitted in September with acute MI and inferior Q-wave and suggestion as ST elevation. He agreed to go on dialysis if needed so he underwent cardiac catheterization with total occlusion of SVG to RCA, total occlusion of SVG to diagonal, thrombotic high-grade stenosis throughout the entirety of sequential SVG to OM1/OM 2, patent LIMA to LAD with distal LAD collateral to RCA, severe elevated LVEDP 35-40%. It was felt the culprit lesion was occluded SVG to OM1/OM 2, which subsequently recanalized on IV heparin. It was recommended if he has recurrent angina, may need to consider PCI at a later time. Echocardiogram obtained during the hospitalization showed EF 40-45%, akinesis of inferior myocardium, mild MR, PA systolic pressure 46 mmHg. He presented this time with shortness  breath for one week. Initial exam was concerning for acute on chronic systolic heart failure. He was admitted to cardiology service for diuresis. He was also noted to be in sinus bradycardia with heart rate in the 40s, metoprolol was decreased to 50 mg twice a day. However his blood pressure was poorly controlled, metoprolol was switched to carvedilol and was eventually increased to 25 mg twice a day. He had good diuresis on IV Lasix, he was eventually transitioned to oral Lasix on 11/2.  He was seen in the morning of 01/01/2015, at which time he was doing well without significant shortness of breath or chest pain. Serial troponin has been flat during this hospitalization, this is consistent with the level of his chronically elevated troponin. His Lasix has been transitioned to 60 mg twice a day. Given increased dose of Lasix, he will need one week BMET to monitor renal function. I have also arranged one week to transition of care follow-up. During this hospitalization, his weight has decreased from 167 pounds down to 159 pounds which will serve as his new dry weight. He has diuresed a total of 3.2 L. He is deemed stable for discharge from cardiology perspective.     Discharge Vitals Blood pressure 111/69, pulse 67, temperature 97.7 F (36.5 C), temperature source Oral, resp. rate 16, height _0  (1.803 m), weight 159 lb 6.3 oz (72.3 kg), SpO2 100 %.  Filed Weights   12/30/14 0311 12/31/14 0332 01/01/15 0356  Weight: 159 lb 2.8 oz (72.2 kg) 160 lb 4.4 oz (72.7 kg) 159 lb 6.3 oz (72.3 kg)    Labs  Basic Metabolic Panel  Recent Labs  12/31/14 0254 01/01/15 0411  NA 140 140  K 3.6 3.6  CL 101 104  CO2 29 27  GLUCOSE 166* 135*  BUN 38* 33*  CREATININE 3.14* 3.02*  CALCIUM 9.6 9.5   Cardiac Enzymes  Recent Labs  12/29/14 1936 12/30/14 0045 12/30/14 0715  TROPONINI 0.10* 0.10* 0.09*   Thyroid Function Tests  Recent Labs  12/29/14 1942  TSH 2.846    Disposition  Pt is  being discharged home today in good condition.  Follow-up Plans & Appointments      Follow-up Information    Schedule an appointment as soon as possible for a visit with [name], MD.   Specialty:  Internal Medicine   Why:  Please followup with your primary care physician as soon as possible   Contact information:   [place] [address] [phone]       Follow up with Truitt Merle, NP On 01/06/2015.   Specialties:  Nurse Practitioner, Interventional Cardiology, Cardiology, Radiology   Why:  2:00pm. Cardiology followup. Arrive 30 min early to obtain BMET lab on the same day to assess kidney function after recent adjustment of your lasix dose.   Contact information:   [place] [place] [address] [phone]       Discharge Medications    Medication List    STOP taking these medications        metoprolol 100 MG tablet  Commonly known as:  LOPRESSOR      TAKE these medications        ACCU-CHEK AVIVA PLUS test strip  Generic drug:  glucose blood  USE TWICE DAILY AS NEEDED     ACCU-CHEK AVIVA PLUS W/DEVICE Kit  USE AS DIRECTED     ACCU-CHEK AVIVA Soln  USE AS DIRECTED     ACCU-CHEK SOFTCLIX LANCETS lancets  1 each by Other route 2 (two) times daily as needed for other.     acetaminophen 500 MG tablet  Commonly known as:  TYLENOL  Take 500 mg by mouth daily as needed for mild pain or headache.     amLODipine 10 MG tablet  Commonly known as:  NORVASC  TAKE 1 TABLET DAILY     aspirin EC 81 MG tablet  Take 81 mg by mouth daily.     atorvastatin 80 MG tablet  Commonly known as:  LIPITOR  Take 1 tablet (80 mg total) by mouth daily at 6 PM.     B-D SINGLE USE SWABS REGULAR Pads  USE AS DIRECTED     carvedilol 25 MG tablet  Commonly known as:  COREG  Take 1 tablet (25 mg total) by mouth 2 (two) times daily with a meal.     cloNIDine 0.1 MG tablet  Commonly known as:  CATAPRES  Take 0.1 mg by  mouth at bedtime.     clopidogrel 75 MG tablet  Commonly known as:  PLAVIX  Take 1 tablet (75 mg total) by mouth daily.     furosemide 40 MG tablet  Commonly known as:  LASIX  Take 1.5 tablets (60 mg total) by mouth 2 (two) times daily.     insulin aspart 100 UNIT/ML FlexPen  Commonly known as:  NOVOLOG FLEXPEN  INJECT 14 UNITS INTO THE SKIN THREE TIMES DAILY WITH MEALS.     Insulin Glargine 100 UNIT/ML Solostar Pen  Commonly known as:  LANTUS  Inject 20 Units into the skin daily at 10 pm.  Insulin Pen Needle 32G X 4 MM Misc  Commonly known as:  RELION PEN NEEDLES  1 each by Does not apply route 2 (two) times daily as needed.     isosorbide mononitrate 30 MG 24 hr tablet  Commonly known as:  IMDUR  Take 3 tablets (90 mg total) by mouth daily.     latanoprost 0.005 % ophthalmic solution  Commonly known as:  XALATAN  Place 1 drop into both eyes at bedtime.     Melatonin 5 MG Tabs  Take 5 mg by mouth at bedtime as needed (use for sleep).     nitroGLYCERIN 0.4 MG SL tablet  Commonly known as:  NITROSTAT  Place 0.4 mg under the tongue every 5 (five) minutes as needed for chest pain (3 doses MAX).     pentoxifylline 400 MG CR tablet  Commonly known as:  TRENTAL  TAKE 1 TABLET (400 MG TOTAL)  BY MOUTH 2 (TWO) TIMES DAILY AT 10 AMAND  5 PM.     Potassium Chloride ER 20 MEQ Tbcr  Take 20 mEq by mouth daily.     SYRINGE-NEEDLE (DISP) 3 ML 25G X 1" 3 ML Misc  1 each by Does not apply route every 14 (fourteen) days.     timolol 0.25 % ophthalmic solution  Commonly known as:  TIMOPTIC  Place 1 drop into both eyes daily.        Outstanding Labs/Studies  BMET in 7 days  Duration of Discharge Encounter   Greater than 30 minutes including physician time.  Hilbert Corrigan PA-C Pager: [phone] 01/01/2015, 1:06 PM

## 2015-01-01 NOTE — Telephone Encounter (Signed)
New problem   Pt has TCM w/Lori per Isaac Laud 11.8.16

## 2015-01-02 NOTE — Telephone Encounter (Signed)
Follow up      Pt was recently discharged from [facility].  He want to know if he is to continue taking his metoprolol?

## 2015-01-02 NOTE — Telephone Encounter (Signed)
  Patient contacted regarding discharge from [facility] on 01/01/15.  Patient understands to follow up with provider Truitt Merle NP  on 01/06/15 at 2pm at [facility]. Patient understands discharge instructions? yes Patient understands medications and regiment? Pt had a question about if he needs to continue taking his metoprolol or not.  Informed the pt that metoprolol was discontinued in the hospital and was switched to carvedilol, which was increased to 25 mg po bid.  Informed the pt that this was discontinued in the hospital, and switched to carvedilol, because his HR and BP couldn't tolerate metoprolol.  Pt states he does have carvedilol on hand and just received it from his pharmacy.  Pt states his CMA is arranging his pills for the week, as we speak.  Pt has no other medication questions at this time.  Patient understands to bring all medications to this visit? yes  Pt has no further questions or concerns at this time.  Pt states he is resting comfortable and will "see you next week."

## 2015-01-03 NOTE — Telephone Encounter (Signed)
Increase furosemide to 80 mg BID and chech BMET in 1 week

## 2015-01-05 ENCOUNTER — Other Ambulatory Visit: Payer: Self-pay | Admitting: *Deleted

## 2015-01-05 NOTE — Telephone Encounter (Signed)
Called to give pt Dr.Smith's recommendation below. [email or web address] 

## 2015-01-05 NOTE — Patient Outreach (Signed)
[facility]) Care Management  01/05/2015  [name] Jun 24, 1931 [id number]  Initial Transition of care  Rn spoke with pt today who indicates he is doing "well" with no reported issues. States he has not made a post-op appointment with his primary provider but will contact them tomorrow as requested by this RN to inquired on making an appointment. Pt states he has an appointment with his cardiologist (Dr. Tamala Julian) scheduled. Pt states he has all his discharged medications and has been administering with no problems. RN inquired on pt's diabetes as pt states stable with CBG this morning of 135 and this evening was 76 as pt aware when his levels are low ans what to do if acute signs occur. No other inquires or request at at this time. RN has scheduled the next home visit and request to follow up telephonically next week with ongoing transition of care outreach (pt receptive). Will follow up accordingly. [name], RN Care Management Coordinator [facility] Main Office [phone]

## 2015-01-05 NOTE — Telephone Encounter (Signed)
Pt aware of Dr.Smith's recommendation Increase furosemide to 80 mg BID and chech BMET in 1 week  Pt sts that he has an appt with Truitt Merle, NP tomorrow 11/8. He would like to wait until being evaluated before making any med changes. Will fwd FYI of Dr.Smith's recommendation to [facility]

## 2015-01-06 ENCOUNTER — Ambulatory Visit (INDEPENDENT_AMBULATORY_CARE_PROVIDER_SITE_OTHER): Payer: Commercial Managed Care - HMO | Admitting: Nurse Practitioner

## 2015-01-06 ENCOUNTER — Encounter: Payer: Self-pay | Admitting: Nurse Practitioner

## 2015-01-06 ENCOUNTER — Other Ambulatory Visit: Payer: Commercial Managed Care - HMO

## 2015-01-06 VITALS — BP 118/70 | HR 67 | Ht 71.0 in | Wt 160.1 lb

## 2015-01-06 DIAGNOSIS — I10 Essential (primary) hypertension: Secondary | ICD-10-CM

## 2015-01-06 DIAGNOSIS — N184 Chronic kidney disease, stage 4 (severe): Secondary | ICD-10-CM | POA: Diagnosis not present

## 2015-01-06 DIAGNOSIS — I25810 Atherosclerosis of coronary artery bypass graft(s) without angina pectoris: Secondary | ICD-10-CM

## 2015-01-06 DIAGNOSIS — I5042 Chronic combined systolic (congestive) and diastolic (congestive) heart failure: Secondary | ICD-10-CM

## 2015-01-06 LAB — BASIC METABOLIC PANEL
BUN: 57 mg/dL — ABNORMAL HIGH (ref 7–25)
CO2: 25 mmol/L (ref 20–31)
Calcium: 10.1 mg/dL (ref 8.6–10.3)
Chloride: 100 mmol/L (ref 98–110)
Creat: 3.52 mg/dL — ABNORMAL HIGH (ref 0.70–1.11)
Glucose, Bld: 55 mg/dL — ABNORMAL LOW (ref 65–99)
Potassium: 4 mmol/L (ref 3.5–5.3)
Sodium: 138 mmol/L (ref 135–146)

## 2015-01-06 LAB — CBC
HCT: 34.8 % — ABNORMAL LOW (ref 39.0–52.0)
Hemoglobin: 12.1 g/dL — ABNORMAL LOW (ref 13.0–17.0)
MCH: 28.5 pg (ref 26.0–34.0)
MCHC: 34.8 g/dL (ref 30.0–36.0)
MCV: 82.1 fL (ref 78.0–100.0)
MPV: 9.9 fL (ref 8.6–12.4)
Platelets: 280 10*3/uL (ref 150–400)
RBC: 4.24 MIL/uL (ref 4.22–5.81)
RDW: 15.3 % (ref 11.5–15.5)
WBC: 7.6 10*3/uL (ref 4.0–10.5)

## 2015-01-06 NOTE — Patient Instructions (Addendum)
We will be checking the following labs today - BMET, BNP and CBC  Medication Instructions:    Continue with your current medicines.     Testing/Procedures To Be Arranged:  N/A  Follow-Up:   See Dr. Tamala Julian in January  See me in one month     Other Special Instructions:   Weigh daily   Restrict salt    If you need a refill on your cardiac medications before your next appointment, please call your pharmacy.   Call the [place] office at [phone] if you have any questions, problems or concerns.

## 2015-01-06 NOTE — Progress Notes (Signed)
CARDIOLOGY OFFICE NOTE  Date:  01/06/2015    [name] Date of Birth: 1932/02/27 Medical Record [id number]  PCP:  [name], MD  Cardiologist:  Tamala Julian    Chief Complaint  Patient presents with  . Congestive Heart Failure    TOC visit - seen for Dr. Tamala Julian    History of Present Illness: [name] is a 81 y.o. male who presents today for a TOC/post hospital visit. Seen for Dr. Tamala Julian. He has a history of coronary artery disease with CABG back in 2003, coronary artery bypass grafting with failed vein graft but patent LIMA documented by cath September 2016, hypertension, chronic kidney disease stage IV (followed by Dr. Joelyn Oms), diabetes mellitus type 2 with complications, PUD, anemia, chronic systolic HF and hyperlipidemia.  Has had non-ST elevation myocardial infarction associated with acute heart failure back in July of 2016. Had high risk stress testing but refused cath at that time. Readmitted in September with acute MI and was cathed after agreeing to possible dialysis. Further investigation demonstrated occlusion of all saphenous vein grafts. It was felt the culprit lesion was occluded SVG to OM1/OM 2, which subsequently recanalized on IV heparin. It was recommended if he has recurrent angina, may need to consider PCI at a later time. Echocardiogram obtained during the hospitalization showed EF 40-45%, akinesis of inferior myocardium, mild MR, PA systolic pressure 46 mmHg.   He has been readmitted at the end of October with shortness breath for one week. Initial exam was concerning for acute on chronic systolic heart failure. He was admitted to cardiology service for diuresis. He was also noted to be in sinus bradycardia with heart rate in the 40s, metoprolol was decreased to 50 mg twice a day. However his blood pressure was poorly controlled, metoprolol was switched to carvedilol and was eventually increased to 25 mg twice a day. He had good diuresis on  IV Lasix, he was eventually transitioned to oral Lasix on 11/2. During this hospitalization, his weight has decreased from 167 pounds down to 159 pounds which will serve as his new dry weight. He diuresed a total of 3.2 L.   Comes back today. Here alone. Says he is doing ok. Took exercise class this morning without issue. Says his breathing is ok. Does not weigh every day. Sounds like he is trying to eat less salt. He is taking his medicine. No chest pain. No swelling. Not dizzy. Asking about his dose of metoprolol and his dose of lasix - phone call yesterday about increasing his Lasix but this was PRIOR to his admission.   Past Medical History  Diagnosis Date  . CAD (coronary artery disease)     a. s/p CABG 2003. b. NSTEMI 10/2014 - cath with bypass graft failure; vessel recanalized on IV heparin, treated medically, reserving PCI for recurrent angina.  . Hyperlipidemia   . Essential hypertension   . Myocardial infarction ([place]) 1998  . Type II diabetes mellitus ([place])   . History of stomach ulcers   . Arthritis     "touch in my fingers" (09/10/2014)  . B12 deficiency anemia     "stopped taking the shots in ~ 06/2014" (09/10/2014)  . Diabetes 1.5, managed as type 1 ([place])   . CKD (chronic kidney disease), stage IV ([place])   . PUD (peptic ulcer disease) 2013  . Chronic systolic CHF (congestive heart failure) ([facility])     a. EF 40-45% in 10/2014 (previously normal in 08/2014).  . Ischemic cardiomyopathy  Past Surgical History  Procedure Laterality Date  . Esophagogastroduodenoscopy  06/10/2011    Procedure: ESOPHAGOGASTRODUODENOSCOPY (EGD);  Surgeon: Ladene Artist, MD,FACG;  Location: [facility] ENDOSCOPY;  Service: Endoscopy;  Laterality: N/A;  . Coronary artery bypass graft  2003    CABG X5  . Cardiac catheterization  2003;   . Coronary angioplasty  1998    [name] 07/13/2010  . Cataract extraction, bilateral Bilateral   . Cardiac catheterization N/A 11/07/2014    Procedure: Left Heart Cath and  Coronary Angiography;  Surgeon: Belva Crome, MD;  Location: [place] CV LAB;  Service: Cardiovascular;  Laterality: N/A;     Medications: Current Outpatient Prescriptions  Medication Sig Dispense Refill  . acetaminophen (TYLENOL) 500 MG tablet Take 500 mg by mouth daily as needed for mild pain or headache.    Marland Kitchen amLODipine (NORVASC) 10 MG tablet TAKE 1 TABLET DAILY (Patient taking differently: Take 10 mg by mouth at bedtime. ) 90 tablet 3  . aspirin EC 81 MG tablet Take 81 mg by mouth daily.    Marland Kitchen atorvastatin (LIPITOR) 80 MG tablet Take 1 tablet (80 mg total) by mouth daily at 6 PM. 30 tablet 0  . carvedilol (COREG) 25 MG tablet Take 1 tablet (25 mg total) by mouth 2 (two) times daily with a meal. 60 tablet 0  . cloNIDine (CATAPRES) 0.1 MG tablet Take 0.1 mg by mouth at bedtime.     . clopidogrel (PLAVIX) 75 MG tablet Take 1 tablet (75 mg total) by mouth daily. 30 tablet 0  . furosemide (LASIX) 40 MG tablet Take 1.5 tablets (60 mg total) by mouth 2 (two) times daily. 90 tablet 3  . insulin aspart (NOVOLOG FLEXPEN) 100 UNIT/ML FlexPen INJECT 14 UNITS INTO THE SKIN THREE TIMES DAILY WITH MEALS. 15 pen 3  . Insulin Glargine (LANTUS) 100 UNIT/ML Solostar Pen Inject 20 Units into the skin daily at 10 pm. 5 pen 3  . isosorbide mononitrate (IMDUR) 30 MG 24 hr tablet Take 3 tablets (90 mg total) by mouth daily. 90 tablet 0  . latanoprost (XALATAN) 0.005 % ophthalmic solution Place 1 drop into both eyes at bedtime.     . Melatonin 5 MG TABS Take 5 mg by mouth at bedtime as needed (use for sleep).     . nitroGLYCERIN (NITROSTAT) 0.4 MG SL tablet Place 0.4 mg under the tongue every 5 (five) minutes as needed for chest pain (3 doses MAX).    Marland Kitchen pentoxifylline (TRENTAL) 400 MG CR tablet TAKE 1 TABLET (400 MG TOTAL)  BY MOUTH 2 (TWO) TIMES DAILY AT 10 AMAND  5 PM. 180 tablet 3  . Potassium Chloride ER 20 MEQ TBCR Take 20 mEq by mouth daily. 30 tablet 0  . timolol (TIMOPTIC) 0.25 % ophthalmic solution  Place 1 drop into both eyes daily.      No current facility-administered medications for this visit.    Allergies: No Known Allergies  Social History: The patient  reports that he has quit smoking. His smoking use included Cigarettes. He has a 10 pack-year smoking history. He has never used smokeless tobacco. He reports that he does not drink alcohol or use illicit drugs.   Family History: The patient's family history includes Cancer in his mother; Coronary artery disease in an other family member; Diabetes in an other family member; Heart attack in his sister; Hypertension in his mother and sister; Stroke in his mother.   Review of Systems: Please see the history of present illness.  Otherwise, the review of systems is positive for none.   All other systems are reviewed and negative.   Physical Exam: VS:  BP 118/70 mmHg  Pulse 67  Ht 5\' 11"  (1.803 m)  Wt 160 lb 1.9 oz (72.63 kg)  BMI 22.34 kg/m2  SpO2 98% .  BMI Body mass index is 22.34 kg/(m^2).  Wt Readings from Last 3 Encounters:  01/06/15 160 lb 1.9 oz (72.63 kg)  01/01/15 159 lb 6.3 oz (72.3 kg)  12/25/14 165 lb (74.844 kg)    General: Pleasant. Elderly black male - looks younger than his stated age and in no acute distress.  HEENT: Normal. Neck: Supple, no JVD, carotid bruits, or masses noted.  Cardiac: Irregular irregular today - needs EKG.  No edema.  Respiratory:  Lungs are clear to auscultation bilaterally with normal work of breathing.  GI: Soft and nontender.  MS: No deformity or atrophy. Gait and ROM intact. Skin: Warm and dry. Color is normal.  Neuro:  Strength and sensation are intact and no gross focal deficits noted.  Psych: Alert, appropriate and with normal affect.   LABORATORY DATA:  EKG:  EKG is ordered today. This demonstrates    Lab Results  Component Value Date   WBC 5.8 12/29/2014   HGB 10.7* 12/29/2014   HCT 33.5* 12/29/2014   PLT 219 12/29/2014   GLUCOSE 135* 01/01/2015   CHOL 118  11/07/2014   TRIG 129 11/07/2014   HDL 37* 11/07/2014   LDLDIRECT 93.3 01/22/2013   LDLCALC 55 11/07/2014   ALT 47 12/29/2014   AST 33 12/29/2014   NA 140 01/01/2015   K 3.6 01/01/2015   CL 104 01/01/2015   CREATININE 3.02* 01/01/2015   BUN 33* 01/01/2015   CO2 27 01/01/2015   TSH 2.846 12/29/2014   PSA 1.12 01/22/2013   INR 1.46 11/07/2014   HGBA1C 6.8* 10/01/2014   MICROALBUR 94.9* 03/28/2014    BNP (last 3 results)  Recent Labs  12/29/14 1140  BNP 1114.2*    ProBNP (last 3 results) No results for input(s): PROBNP in the last 8760 hours.   Other Studies Reviewed Today:  Echo Study Conclusions from 10/2014  - Left ventricle: The cavity size was normal. Wall thickness was normal. Systolic function was mildly to moderately reduced. The estimated ejection fraction was in the range of 40% to 45%. Akinesis of the inferior myocardium. - Mitral valve: There was mild regurgitation. - Pulmonary arteries: Systolic pressure was moderately increased. PA peak pressure: 46 mm Hg (S).   Procedures    Left Heart Cath and Coronary Angiography from 10/2014    Conclusion     Bypass graft failure with total occlusion of the SVG to the RCA, total occlusion of the SVG to the diagonal, and thrombotic high-grade stenosis throughout the entirety of the sequential saphenous vein graft to the first and second obtuse marginal.  Widely patent left internal mammary graft to the LAD. Distal LAD supplies collaterals to the right coronary.  Severe native vessel coronary disease with total occlusion of the mid LAD, severe diffuse disease in the first diagonal, 50-70% distal left main, and total occlusion of the proximal RCA.  Left ventriculography was not performed. Acute heart failure, denoted by the markedly elevated LVEDP (35-40 mmHg) is present.   RECOMMENDATIONS:   Closely monitor kidney function  IV diuretic therapy to treat acute heart failure  IV heparin, may help  to maintain patency in the thrombosed sequential saphenous vein graft. My thought is that  this graft was totally occluded resulting in marked enzyme release. On IV heparin the vessel has subsequently recanalized. We should also add Plavix to the patient's current medical regimen. Intervention on the sequential saphenous vein graft will require significant contrast. Final decision will be based upon clinical course, i.e., if recurrent angina/equivalent, may need to consider PCI.  Echo for LV assessment, with appropriate management dependent upon EF. This could include consideration of device therapy in the future if indicated.     Assessment/Plan: 1. Coronary atherosclerosis of autologous vein bypass graft without angina Failed native and saphenous vein grafts per recent cath from September. Patent LIMA to LAD. Angina is not clinically problematic at this time. Will need to manage medically. I have left him on his current regimen for now.   2. Chronic kidney disease, stage IV (severe) ([place]) Followed by Dr. Joelyn Oms. Rechecking lab today.   3. Essential hypertension Well controlled on his current regimen.   4. Chronic combined systolic and diastolic HF (heart failure) ([place]) with recent exacerbation - has known LV dysfunction - looks compensated. No change in current regimen. Encouraged to weigh daily, restrict salt, etc. See back in a month to recheck.   5. Dyslipidemia  6. Irregular rhythm - checking EKG today. This shows sinus with PACs and PVCs.  No AF noted.   On therapy followed by primary care   Current medicines are reviewed with the patient today.  The patient does not have concerns regarding medicines other than what has been noted above.  The following changes have been made:  See above.  Labs/ tests ordered today include:    Orders Placed This Encounter  Procedures  . Brain natriuretic peptide  . Basic metabolic panel  . CBC  . EKG 12-Lead     Disposition:   FU with  Dr. Tamala Julian as planned in January. I will see him back in a month.   Patient is agreeable to this plan and will call if any problems develop in the interim.   Signed: Burtis Junes, RN, ANP-C 01/06/2015 2:36 PM  [facility] [address] [place], [place]  [address] Phone: [phone] Fax: [phone]

## 2015-01-07 LAB — BRAIN NATRIURETIC PEPTIDE: Brain Natriuretic Peptide: 660.5 pg/mL — ABNORMAL HIGH (ref 0.0–100.0)

## 2015-01-08 ENCOUNTER — Other Ambulatory Visit: Payer: Self-pay | Admitting: *Deleted

## 2015-01-08 ENCOUNTER — Telehealth: Payer: Self-pay | Admitting: Nurse Practitioner

## 2015-01-08 DIAGNOSIS — N184 Chronic kidney disease, stage 4 (severe): Secondary | ICD-10-CM

## 2015-01-08 MED ORDER — FUROSEMIDE 40 MG PO TABS: 40.0000 mg | ORAL_TABLET | Freq: Two times a day (BID) | ORAL | Status: DC

## 2015-01-08 NOTE — Telephone Encounter (Signed)
New Message   Pt is stating he is returning call to rn about blood work results

## 2015-01-12 ENCOUNTER — Other Ambulatory Visit: Payer: Self-pay | Admitting: *Deleted

## 2015-01-12 NOTE — Patient Outreach (Addendum)
[facility]) Care Management  01/12/2015  REA PARCHEM Oct 12, 1931 [id number]   Transition of care (second call)  RN spoke with pt today who indicated he is doing "much better"  with no breathing problems or low heart rate symptoms as pt continues to take all prescribed medications and attend all scheduled medical appointments with no problems. Pt states his CBG was 125 this morning with no problems as this is his usually readings in the morning. RN offered a home visit due to his recent discharge from the hospital as pt declined at this time indicating he is managing his care well with no problems. Pt reports his doctor's follow up appointment with his primary this week 11/16. Will continue [facility] services and continue transition of care contacts and visit pt on the scheduled appointment date this month 11/30. No other inquires or request at this time.  [name], RN Care Management Coordinator [facility] Main Office [phone]

## 2015-01-14 ENCOUNTER — Encounter: Payer: Self-pay | Admitting: Family Medicine

## 2015-01-14 ENCOUNTER — Telehealth: Payer: Self-pay | Admitting: Cardiology

## 2015-01-14 ENCOUNTER — Ambulatory Visit (INDEPENDENT_AMBULATORY_CARE_PROVIDER_SITE_OTHER): Payer: Commercial Managed Care - HMO | Admitting: Family Medicine

## 2015-01-14 VITALS — BP 118/76 | HR 63 | Temp 97.9°F | Ht 71.0 in | Wt 163.2 lb

## 2015-01-14 DIAGNOSIS — I25810 Atherosclerosis of coronary artery bypass graft(s) without angina pectoris: Secondary | ICD-10-CM | POA: Diagnosis not present

## 2015-01-14 DIAGNOSIS — I5023 Acute on chronic systolic (congestive) heart failure: Secondary | ICD-10-CM

## 2015-01-14 DIAGNOSIS — I739 Peripheral vascular disease, unspecified: Secondary | ICD-10-CM

## 2015-01-14 DIAGNOSIS — E0821 Diabetes mellitus due to underlying condition with diabetic nephropathy: Secondary | ICD-10-CM | POA: Diagnosis not present

## 2015-01-14 DIAGNOSIS — E162 Hypoglycemia, unspecified: Secondary | ICD-10-CM | POA: Diagnosis not present

## 2015-01-14 DIAGNOSIS — I10 Essential (primary) hypertension: Secondary | ICD-10-CM

## 2015-01-14 DIAGNOSIS — N184 Chronic kidney disease, stage 4 (severe): Secondary | ICD-10-CM

## 2015-01-14 DIAGNOSIS — Z794 Long term (current) use of insulin: Secondary | ICD-10-CM

## 2015-01-14 DIAGNOSIS — E785 Hyperlipidemia, unspecified: Secondary | ICD-10-CM

## 2015-01-14 MED ORDER — INSULIN GLARGINE 100 UNIT/ML SOLOSTAR PEN: 18.0000 [IU] | PEN_INJECTOR | Freq: Every day | SUBCUTANEOUS | Status: DC

## 2015-01-14 NOTE — Progress Notes (Signed)
HPI:  [name] is an 81 yo patient of Dr. Burnice Logan with a very complicated PMH of CAD s/p CABG, NSTEMI 10/2014, CsCHF, ischemic cardiomyopathy, CKD, DM, HTN, HLD, PUD here for a hospital follow up. He already did his transitional care visit with his cardiologist. On review of discharge summary, he was hospitalized 10/31-11/04/2014 for Acute on Chronic CHF.He underwent diuresis. He also had bradycardia and his BB was changed to carvedilol. He has already seen his cardiologist for transition of care a few days ago and sees nephrologist regarding his CKD. His cardiologist decreased his lasix to 60mg  in the am and 40mg  in the pm and he has a repeat BMP scheduled later this week. In terms of his diabetes, it appears has been relatively well controlled along with his lipids. He reports since out of the hospital sometimes skips breakfast and has had several low blood sugars in the 60s about 1-2 times per week. He take lantus 20 units at night and novolog 14 units with meals. He is due for his diabetic foot and eye exam. He denies: CP, SOB, DOE, weight gain on scale at home - reports he is checking daily and is at 150lbs, orthopnea, fevers, malaise, burning in feet, falls, wounds.  Lab Results  Component Value Date   CHOL 118 11/07/2014   HDL 37* 11/07/2014   LDLCALC 55 11/07/2014   LDLDIRECT 93.3 01/22/2013   TRIG 129 11/07/2014   CHOLHDL 3.2 11/07/2014   Lab Results  Component Value Date   HGBA1C 6.8* 10/01/2014   ROS: See pertinent positives and negatives per HPI.  Past Medical History  Diagnosis Date  . CAD (coronary artery disease)     a. s/p CABG 2003. b. NSTEMI 10/2014 - cath with bypass graft failure; vessel recanalized on IV heparin, treated medically, reserving PCI for recurrent angina.  . Hyperlipidemia   . Essential hypertension   . Myocardial infarction ([place]) 1998  . Type II diabetes mellitus ([place])   . History of stomach ulcers   . Arthritis     "touch in my fingers"  (09/10/2014)  . B12 deficiency anemia     "stopped taking the shots in ~ 06/2014" (09/10/2014)  . Diabetes 1.5, managed as type 1 ([place])   . CKD (chronic kidney disease), stage IV ([place])   . PUD (peptic ulcer disease) 2013  . Chronic systolic CHF (congestive heart failure) (HCC)     a. EF 40-45% in 10/2014 (previously normal in 08/2014).  . Ischemic cardiomyopathy     Past Surgical History  Procedure Laterality Date  . Esophagogastroduodenoscopy  06/10/2011    Procedure: ESOPHAGOGASTRODUODENOSCOPY (EGD);  Surgeon: Ladene Artist, MD,FACG;  [facility] ENDOSCOPY;  Service: Endoscopy;  Laterality: N/A;  . Coronary artery bypass graft  2003    CABG X5  . Cardiac catheterization  2003;   . Coronary angioplasty  1998    [name] 07/13/2010  . Cataract extraction, bilateral Bilateral   . Cardiac catheterization N/A 11/07/2014    Procedure: Left Heart Cath and Coronary Angiography;  Surgeon: Belva Crome, MD;  Location: [place] CV LAB;  Service: Cardiovascular;  Laterality: N/A;    Family History  Problem Relation Age of Onset  . Diabetes      brothers and sisters  . Hypertension Sister   . Coronary artery disease    . Stroke Mother   . Hypertension Mother   . Cancer Mother   . Heart attack Sister     Social History  Social History  . Marital Status: Widowed    Spouse Name: N/A  . Number of Children: N/A  . Years of Education: N/A   Social History Main Topics  . Smoking status: Former Smoker -- 0.50 packs/day for 20 years    Types: Cigarettes  . Smokeless tobacco: Never Used     Comment: "quit smoking cigarettes in the 1970's  . Alcohol Use: No  . Drug Use: No  . Sexual Activity: Not Currently   Other Topics Concern  . None   Social History Narrative     Current outpatient prescriptions:  .  acetaminophen (TYLENOL) 500 MG tablet, Take 500 mg by mouth daily as needed for mild pain or headache., Disp: , Rfl:  .  amLODipine (NORVASC) 10 MG tablet, TAKE 1 TABLET DAILY  (Patient taking differently: Take 10 mg by mouth at bedtime. ), Disp: 90 tablet, Rfl: 3 .  aspirin EC 81 MG tablet, Take 81 mg by mouth daily., Disp: , Rfl:  .  atorvastatin (LIPITOR) 80 MG tablet, Take 1 tablet (80 mg total) by mouth daily at 6 PM., Disp: 30 tablet, Rfl: 0 .  carvedilol (COREG) 25 MG tablet, Take 1 tablet (25 mg total) by mouth 2 (two) times daily with a meal., Disp: 60 tablet, Rfl: 0 .  cloNIDine (CATAPRES) 0.1 MG tablet, Take 0.1 mg by mouth at bedtime. , Disp: , Rfl:  .  clopidogrel (PLAVIX) 75 MG tablet, Take 1 tablet (75 mg total) by mouth daily., Disp: 30 tablet, Rfl: 0 .  furosemide (LASIX) 40 MG tablet, Take 1 tablet (40 mg total) by mouth 2 (two) times daily. 1.5 table in am 1 tablet in the pm, Disp: 90 tablet, Rfl: 3 .  insulin aspart (NOVOLOG FLEXPEN) 100 UNIT/ML FlexPen, INJECT 14 UNITS INTO THE SKIN THREE TIMES DAILY WITH MEALS., Disp: 15 pen, Rfl: 3 .  Insulin Glargine (LANTUS) 100 UNIT/ML Solostar Pen, Inject 18 Units into the skin daily at 10 pm., Disp: 5 pen, Rfl: 3 .  isosorbide mononitrate (IMDUR) 30 MG 24 hr tablet, Take 3 tablets (90 mg total) by mouth daily., Disp: 90 tablet, Rfl: 0 .  latanoprost (XALATAN) 0.005 % ophthalmic solution, Place 1 drop into both eyes at bedtime. , Disp: , Rfl:  .  Melatonin 5 MG TABS, Take 5 mg by mouth at bedtime as needed (use for sleep). , Disp: , Rfl:  .  nitroGLYCERIN (NITROSTAT) 0.4 MG SL tablet, Place 0.4 mg under the tongue every 5 (five) minutes as needed for chest pain (3 doses MAX)., Disp: , Rfl:  .  pentoxifylline (TRENTAL) 400 MG CR tablet, TAKE 1 TABLET (400 MG TOTAL)  BY MOUTH 2 (TWO) TIMES DAILY AT 10 AMAND  5 PM., Disp: 180 tablet, Rfl: 3 .  Potassium Chloride ER 20 MEQ TBCR, Take 20 mEq by mouth daily., Disp: 30 tablet, Rfl: 0 .  timolol (TIMOPTIC) 0.25 % ophthalmic solution, Place 1 drop into both eyes daily. , Disp: , Rfl:   EXAM:  Filed Vitals:   01/14/15 0911  BP: 118/76  Pulse: 63  Temp: 97.9 F (36.6  C)    Body mass index is 22.77 kg/(m^2).  GENERAL: vitals reviewed and listed above, alert, oriented, appears well hydrated and in no acute distress  HEENT: atraumatic, conjunttiva clear, no obvious abnormalities on inspection of external nose and ears  NECK: no obvious masses on inspection  LUNGS: clear to auscultation bilaterally, no wheezes, rales or rhonchi, good air movement  CV: HRRR,  SEM, no peripheral edema  MS: moves all extremities without noticeable abnormality  PSYCH: pleasant and cooperative, no obvious depression or anxiety  FOOT exam done  ASSESSMENT AND PLAN:  Discussed the following assessment and plan:  Diabetes mellitus due to underlying condition with diabetic nephropathy, with long-term current use of insulin (HCC)  Hypoglycemia  Essential hypertension  Coronary atherosclerosis of autologous vein bypass graft without angina  Dyslipidemia  Peripheral vascular disease (HCC)  Acute on chronic systolic CHF (congestive heart failure) (HCC)  CKD (chronic kidney disease), stage IV ([place])  -discussed importance of regular healthy meals at length, he feels capable of doing this -decrease lantus slightly,hypoglycemia management reviewed, follow up with PCP as scheduled -foot exam done -no cardiac symptoms or concerns today -he reports he has yearly check up with optho -reviewed signs/symptoms of fluid overload -Patient advised to return or notify a doctor immediately if symptoms worsen or persist or new concerns arise.  Patient Instructions  Keep the appointment with your doctor  Please decrease your LANTUS (glargine, long acting insulin) to 18 units nightly  Ensure you are eating at least 3 small healthy meals per day  Continue to be on the look out for low blood sugars and treat accordingly and notify your doctor  Continue to monitor your weight and contact your cardiologist immediately if increase weight, increased swelling, trouble breathing,  chest pain or any concerns regarding your heart.  Please schedule your yearly diabetic eye exam     KIM, HANNAH R.

## 2015-01-14 NOTE — Telephone Encounter (Signed)
Spoke with pt, aware dr Tamala Julian and dr Stanford Breed are partners. Questions regarding potassium answered.

## 2015-01-14 NOTE — Telephone Encounter (Signed)
Pt received some Potassium medicine,Dr Crenshaw name was on it. He says he never seen Dr Stanford Breed.

## 2015-01-14 NOTE — Patient Instructions (Addendum)
Keep the appointment with your doctor  Please decrease your LANTUS (glargine, long acting insulin) to 18 units nightly  Ensure you are eating at least 3 small healthy meals per day  Continue to be on the look out for low blood sugars and treat accordingly and notify your doctor  Continue to monitor your weight and contact your cardiologist immediately if increase weight, increased swelling, trouble breathing, chest pain or any concerns regarding your heart.  Please schedule your yearly diabetic eye exam

## 2015-01-14 NOTE — Progress Notes (Signed)
Pre visit review using our clinic review tool, if applicable. No additional management support is needed unless otherwise documented below in the visit note. 

## 2015-01-15 ENCOUNTER — Other Ambulatory Visit (INDEPENDENT_AMBULATORY_CARE_PROVIDER_SITE_OTHER): Payer: Commercial Managed Care - HMO | Admitting: *Deleted

## 2015-01-15 DIAGNOSIS — N184 Chronic kidney disease, stage 4 (severe): Secondary | ICD-10-CM

## 2015-01-15 LAB — BASIC METABOLIC PANEL
BUN: 48 mg/dL — ABNORMAL HIGH (ref 7–25)
CO2: 24 mmol/L (ref 20–31)
Calcium: 9.3 mg/dL (ref 8.6–10.3)
Chloride: 103 mmol/L (ref 98–110)
Creat: 2.81 mg/dL — ABNORMAL HIGH (ref 0.70–1.11)
Glucose, Bld: 119 mg/dL — ABNORMAL HIGH (ref 65–99)
Potassium: 4.3 mmol/L (ref 3.5–5.3)
Sodium: 138 mmol/L (ref 135–146)

## 2015-01-16 ENCOUNTER — Telehealth: Payer: Self-pay | Admitting: Interventional Cardiology

## 2015-01-16 NOTE — Telephone Encounter (Signed)
New Message    Pt is returning call about blood work

## 2015-01-19 ENCOUNTER — Other Ambulatory Visit: Payer: Self-pay | Admitting: *Deleted

## 2015-01-19 DIAGNOSIS — N184 Chronic kidney disease, stage 4 (severe): Secondary | ICD-10-CM | POA: Diagnosis not present

## 2015-01-19 NOTE — Patient Outreach (Signed)
[facility]) Care Management  01/19/2015  LANNY VONWALD 02-01-1932 [id number]  Transition of care (week 3)  RN attempted to contact pt today however unsuccessful but RN able to leave a HIPAA approved voice message. Will continue outreach calls accordingly.  [name], RN Care Management Coordinator [place] [facility] Main Office [phone]

## 2015-01-19 NOTE — Patient Outreach (Signed)
[place] [facility]) Care Management  01/19/2015  [name] 1931/10/13 [id number]  RN received a call from pt and inquired further on pt's ongoing recover. Pt reports his recent CBG reading this morning was 125 and his recent labs and reports have been good. Denies ans additional symptoms of SOB and pt continuing his normal activities with no problems. Due to pt's recent diagnosis with with HF RN inquired if pt continues to weight daily and document all weights for his providers to view ( pt has not been weighing daily or monitoring his HD). RN strongly encouraged pt to weight daily and once again document all weights along with the teachings from the HF zones. RN stress the importance of daily monitoring with weights and verified after further education that pt is in the GREEN zone with no breathing problems. Offered to provided HF information on further education of his HF zones and pt is without distressful symptoms at this time in the GREEN zone. Discussed if pt gains 3 lbs over night or 5 lbs within one week with any distress symptoms to contact his provider for possible intervention ( pt with understanding of this important monitoring task).  Pt states attendance to all medical appointments and adherence with all medications with no reported problems. RN reminded pt of the upcoming home visit on 11/30 and will once again provider HF information accordingly.  RN also verified pt continues to importance with his eating habits through the day in regulating his CBG.  RN will follow up accordingly.  [name], RN Care Management Coordinator [place] [facility] Main Office [phone]

## 2015-01-27 ENCOUNTER — Other Ambulatory Visit: Payer: Self-pay | Admitting: *Deleted

## 2015-01-27 NOTE — Patient Outreach (Signed)
[facility]) Care Management  01/27/2015  [name] 06-06-1931 [id number]   Transition of care (4th week)  RN spoke with pt today who indicates he is doing "good" with no reported issues. States his CBG are stable in the low 100's. Pt states he believes his diabetes is under control however worried about his recent admission related to "fluid around his heart". Pt verified he has been adherence with all his medical appointments and has all his medications and takes as prescribed. RN discussed HF and briefly educated pt on the HF zones and if he gains 3 lbs overnight or 5 lbs over one week's time to contact his provider with any precipitating symptoms. Offered to enter the pt into the HF program upon the upcoming home visit. Pt states he works out at [employer] several days during the week and feels home visits are not necessary. RN strongly encouraged to at least be receptive to additional home visits to educate him on the medical condition HF as a preventive measures due to his recent admission. Pt receptive and requested his next home visit for next Tuesday in the afternoon. Will change and reschedule on calendar and follow up accordingly to enter pt into the HF program. No other request or issues to address at this time. Will re-evaluate current goals at that time and adjust accordingly.  [name], RN Care Management Coordinator [place] [facility] Main Office [phone]

## 2015-01-27 NOTE — Patient Outreach (Signed)
[facility]) Care Management  01/27/2015  [name] 01-Sep-1931 [id number]   RN followed up with this pt due to a recent call into the Medical call center. RN unable to reach pt today and left a HIPAA approved voice message requesting a call back. Will inquire further and follow up accordingly.  [name], RN Care Management Coordinator [place] [facility] Main Office [phone]

## 2015-01-28 ENCOUNTER — Ambulatory Visit: Payer: Commercial Managed Care - HMO | Admitting: *Deleted

## 2015-01-29 ENCOUNTER — Other Ambulatory Visit: Payer: Self-pay

## 2015-01-29 MED ORDER — POTASSIUM CHLORIDE ER 20 MEQ PO TBCR: 20.0000 meq | EXTENDED_RELEASE_TABLET | Freq: Every day | ORAL | Status: DC

## 2015-02-02 ENCOUNTER — Telehealth: Payer: Self-pay | Admitting: *Deleted

## 2015-02-02 NOTE — Telephone Encounter (Signed)
Pt left [employer] fax number on my voicemail. Called pt back and told him I have there number I just don't know what you need. Told pt you are not do for any refills at present and unless you know what it is I would not worry about it. If you need a refill they will send a request. Pt verbalized understanding.

## 2015-02-02 NOTE — Telephone Encounter (Signed)
Spoke to pt, asked him what medication does he need? Pt said he does not know [employer] called him and said they have been trying to get in touch with office. Told pt I have not received anything from [facility]. I would suggest you call them and see what they need and let me know or have them send me a fax. Pt verbalized understanding.

## 2015-02-03 ENCOUNTER — Other Ambulatory Visit: Payer: Self-pay | Admitting: *Deleted

## 2015-02-03 NOTE — Patient Outreach (Addendum)
[facility]) Care Management   02/03/2015  [name] 06-19-1931 [id number]  [name] is an 81 y.o. male  Subjective:  DM: Pt states his is taking his CBG with no abnormal readings and remains stable around the low to mid 100's. Pt stats he is aware when his blood sugars are getting low and tries to eat something. Pt admits he does not have a lot of in between meals but continues to eat healthy meals and workouts several days during the week.  Pt feels his diabetes is under control and receptive to education on the HF program. HF: Pt states due to his last hospitalization he is interested more on the subject of HF. Pt states he does not know anything about HF but receptive to what to do if this occurs. Pt wishes to be just as comfort with HF as he is in managing his diabetes.  MEDICATION: Pt inquired on two medications Potassium. Pt present two different bottles with the same medication, dosage, and daily amount and states he has been taking both medications over the last week. Denies any signs or symptoms and was not aware they were the exact same but realized one was from his local pharmacy and one was from [place].   Objective:   Review of Systems  All other systems reviewed and are negative.   Physical Exam  Constitutional: He is oriented to person, place, and time. He appears well-developed and well-nourished.  HENT:  Right Ear: External ear normal.  Left Ear: External ear normal.  Neck: Normal range of motion.  Cardiovascular: Normal heart sounds.   Respiratory: Effort normal and breath sounds normal.  GI: Soft. Bowel sounds are normal.  Musculoskeletal: Normal range of motion.  Neurological: He is alert and oriented to person, place, and time.  Skin: Skin is warm and dry.  Psychiatric: He has a normal mood and affect. His behavior is normal. Judgment and thought content normal.    Current Medications:   Current Outpatient  Prescriptions  Medication Sig Dispense Refill  . acetaminophen (TYLENOL) 500 MG tablet Take 500 mg by mouth daily as needed for mild pain or headache.    Marland Kitchen amLODipine (NORVASC) 10 MG tablet TAKE 1 TABLET DAILY (Patient taking differently: Take 10 mg by mouth at bedtime. ) 90 tablet 3  . aspirin EC 81 MG tablet Take 81 mg by mouth daily.    Marland Kitchen atorvastatin (LIPITOR) 80 MG tablet Take 1 tablet (80 mg total) by mouth daily at 6 PM. 30 tablet 0  . carvedilol (COREG) 25 MG tablet Take 1 tablet (25 mg total) by mouth 2 (two) times daily with a meal. 60 tablet 0  . cloNIDine (CATAPRES) 0.1 MG tablet Take 0.1 mg by mouth at bedtime.     . clopidogrel (PLAVIX) 75 MG tablet Take 1 tablet (75 mg total) by mouth daily. 30 tablet 0  . furosemide (LASIX) 40 MG tablet Take 1 tablet (40 mg total) by mouth 2 (two) times daily. 1.5 table in am 1 tablet in the pm 90 tablet 3  . insulin aspart (NOVOLOG FLEXPEN) 100 UNIT/ML FlexPen INJECT 14 UNITS INTO THE SKIN THREE TIMES DAILY WITH MEALS. 15 pen 3  . Insulin Glargine (LANTUS) 100 UNIT/ML Solostar Pen Inject 18 Units into the skin daily at 10 pm. 5 pen 3  . isosorbide mononitrate (IMDUR) 30 MG 24 hr tablet Take 3 tablets (90 mg total) by mouth daily. 90 tablet 0  . latanoprost (XALATAN)  0.005 % ophthalmic solution Place 1 drop into both eyes at bedtime.     . Melatonin 5 MG TABS Take 5 mg by mouth at bedtime as needed (use for sleep).     . nitroGLYCERIN (NITROSTAT) 0.4 MG SL tablet Place 0.4 mg under the tongue every 5 (five) minutes as needed for chest pain (3 doses MAX).    Marland Kitchen pentoxifylline (TRENTAL) 400 MG CR tablet TAKE 1 TABLET (400 MG TOTAL)  BY MOUTH 2 (TWO) TIMES DAILY AT 10 AMAND  5 PM. 180 tablet 3  . Potassium Chloride ER 20 MEQ TBCR Take 20 mEq by mouth daily. 30 tablet 3  . timolol (TIMOPTIC) 0.25 % ophthalmic solution Place 1 drop into both eyes daily.      No current facility-administered medications for this visit.    Functional Status:   In your  present state of health, do you have any difficulty performing the following activities: 12/29/2014 12/25/2014  Hearing? N N  Vision? N N  Difficulty concentrating or making decisions? N N  Walking or climbing stairs? N N  Dressing or bathing? N N  Doing errands, shopping? N N  Preparing Food and eating ? - N  Using the Toilet? - N  In the past six months, have you accidently leaked urine? - N  Do you have problems with loss of bowel control? - N  Managing your Medications? - N  Managing your Finances? - N  Housekeeping or managing your Housekeeping? - N    Fall/Depression Screening:    PHQ 2/9 Scores 02/03/2015 12/16/2014 03/28/2014 10/24/2012  PHQ - 2 Score 0 0 0 0  BP 138/72 mmHg  Pulse 66  Resp 20  Ht 1.803 m (5\' 11" )  Wt 161 lb (73.029 kg)  BMI 22.46 kg/m2  SpO2 99%   Assessment:  Follow up on case management related to diabetes Case management related to HF Non-adherence related Potassium ER 20 meq as ordered.   Plan:  Physical assessment completed with no acute symptoms encountered. Will verify pt continue to take his CBG daily and document all readings for his providers to view. Will also verified no hypo-hyperglycemic readings have occur with no signs or symptoms experienced. Will continue to encourage adherence with daily monitoring of his diabetes. Due to pt's adherence with his diabetes and recent admission pleural effusion with HF history. RN will enter pt into the HF program for education and teaching on increasing his knowledge base (pt very receptive). Will began teachings and education on HF to increase pt's knowledge and awaress of what to if acute symptoms should occur and what to do. Provided and discussed in detail printable information via [facility] for the following: HF: Keeping track of your weight each day, HF: When to call your doctor or 911 and HF: Working with your doctor. Stress the importance of fluid retention and how this plays a big part in  hospitalization. Therefore this is something that requires daily monitoring. RN provided pt with a [employer] calendar and encouraged pt to following the HF zones and action plan along with documenting his daily weights on the provider weight chart.  Signs and symptoms discussed as RN verified pt is in the GREEN zone today with no distressful symptoms.  Pt able to articulate some of the information discussed today as RN will reiterate on this subject on the next home visit for pt's knowledge base.  Will encouraged pt to take only one prescription for his potassium as indicated on his medication  list via EPIC and inform pt's cardiologist office (sent message to Truitt Merle, NP) that pt has been taking two difficult bottles dosed with potassium each at 20 MEQ each (totaling a daily dose of 40 MEQ).  Note pt asymptomatic with the extras dosing and aware of the risk of taking any medication he is not clear on and should always contact his provider to clarify prior to taking the medication (pt verbalized an understanding).  Note pt has an appointment tomorrow to his cardiologist.  All other medications discussed and RN verified pt is taking according to the recommended dosage.  Pt counsels accordingly on all medications and the importance of each medication including potassium if take is large dosage. Plan of care and all goals discussed with pt's understanding and has agree to pursue the set goals over the next few months. No other inquires or request at this time. RN will schedule next home visit based upon pt's schedule.   [name], RN Care Management Coordinator [place] [facility] Main Office [phone]

## 2015-02-04 ENCOUNTER — Encounter: Payer: Self-pay | Admitting: Nurse Practitioner

## 2015-02-04 ENCOUNTER — Ambulatory Visit (INDEPENDENT_AMBULATORY_CARE_PROVIDER_SITE_OTHER): Payer: Commercial Managed Care - HMO | Admitting: Nurse Practitioner

## 2015-02-04 VITALS — BP 144/88 | HR 69 | Ht 71.0 in | Wt 166.0 lb

## 2015-02-04 DIAGNOSIS — N184 Chronic kidney disease, stage 4 (severe): Secondary | ICD-10-CM

## 2015-02-04 DIAGNOSIS — R0600 Dyspnea, unspecified: Secondary | ICD-10-CM

## 2015-02-04 DIAGNOSIS — I25810 Atherosclerosis of coronary artery bypass graft(s) without angina pectoris: Secondary | ICD-10-CM

## 2015-02-04 DIAGNOSIS — I10 Essential (primary) hypertension: Secondary | ICD-10-CM | POA: Diagnosis not present

## 2015-02-04 DIAGNOSIS — I5042 Chronic combined systolic (congestive) and diastolic (congestive) heart failure: Secondary | ICD-10-CM

## 2015-02-04 LAB — BASIC METABOLIC PANEL
BUN: 41 mg/dL — ABNORMAL HIGH (ref 7–25)
CO2: 27 mmol/L (ref 20–31)
Calcium: 10.4 mg/dL — ABNORMAL HIGH (ref 8.6–10.3)
Chloride: 104 mmol/L (ref 98–110)
Creat: 3.09 mg/dL — ABNORMAL HIGH (ref 0.70–1.11)
Glucose, Bld: 113 mg/dL — ABNORMAL HIGH (ref 65–99)
Potassium: 4.3 mmol/L (ref 3.5–5.3)
Sodium: 139 mmol/L (ref 135–146)

## 2015-02-04 NOTE — Patient Instructions (Addendum)
We will be checking the following labs today - BMET and BNP   Medication Instructions:    Continue with your current medicines.     Testing/Procedures To Be Arranged:  N/A  Follow-Up:   See Dr. Tamala Julian as planned next month    Other Special Instructions:   Keep trying to restrict your salt.     If you need a refill on your cardiac medications before your next appointment, please call your pharmacy.   Call the [place] office at [phone] if you have any questions, problems or concerns.

## 2015-02-04 NOTE — Progress Notes (Signed)
CARDIOLOGY OFFICE NOTE  Date:  02/04/2015    [name] Date of Birth: 02/03/1932 Medical Record [id number]  PCP:  [name], MD  Cardiologist:  Tamala Julian    Chief Complaint  Patient presents with  . Coronary Artery Disease  . Congestive Heart Failure    Follow up visit - seen for Dr. Tamala Julian  . Hypertension  . Hyperlipidemia    History of Present Illness: CALON FILAR is a 81 y.o. male who presents today for a follow up visit. Seen for Dr. Tamala Julian. He has a history of coronary artery disease with CABG back in 2003, coronary artery bypass grafting with failed vein graft but patent LIMA documented by cath September 2016, hypertension, chronic kidney disease stage IV (followed by Dr. Joelyn Oms), diabetes mellitus type 2 with complications, PUD, anemia, chronic systolic HF and hyperlipidemia.  Has had non-ST elevation myocardial infarction associated with acute heart failure back in July of 2016. Had high risk stress testing but refused cath at that time. Readmitted in September with acute MI and was cathed after agreeing to possible dialysis. Further investigation demonstrated occlusion of all saphenous vein grafts. It was felt the culprit lesion was occluded SVG to OM1/OM 2, which subsequently recanalized on IV heparin. It was recommended if he has recurrent angina, may need to consider PCI at a later time. Echocardiogram obtained during the hospitalization showed EF 40-45%, akinesis of inferior myocardium, mild MR, PA systolic pressure 46 mmHg.   He has been readmitted at the end of October with shortness breath for one week. Initial exam was concerning for acute on chronic systolic heart failure. He was admitted to cardiology service for diuresis. He was also noted to be in sinus bradycardia with heart rate in the 40s, metoprolol was decreased to 50 mg twice a day. However his blood pressure was poorly controlled, metoprolol was switched to carvedilol and was  eventually increased to 25 mg twice a day. He had good diuresis on IV Lasix, he was eventually transitioned to oral Lasix on 11/2. During this hospitalization, his weight has decreased from 167 pounds down to 159 pounds which will serve as his new dry weight. He diuresed a total of 3.2 L.   Seen about 3 weeks ago and was doing ok.   Comes back today. Here alone. Doing well. Not short of breath. No chest pain. Not dizzy. Says his weight is better at home. No swelling. Restricting salt. He says he is only taking 20 meq of Potassiom - some confusion about his dose - THN was at his home yesterday. He is pleased with how he is doing.   Past Medical History  Diagnosis Date  . CAD (coronary artery disease)     a. s/p CABG 2003. b. NSTEMI 10/2014 - cath with bypass graft failure; vessel recanalized on IV heparin, treated medically, reserving PCI for recurrent angina.  . Hyperlipidemia   . Essential hypertension   . Myocardial infarction ([place]) 1998  . Type II diabetes mellitus ([place])   . History of stomach ulcers   . Arthritis     "touch in my fingers" (09/10/2014)  . B12 deficiency anemia     "stopped taking the shots in ~ 06/2014" (09/10/2014)  . Diabetes 1.5, managed as type 1 ([place])   . CKD (chronic kidney disease), stage IV ([place])   . PUD (peptic ulcer disease) 2013  . Chronic systolic CHF (congestive heart failure) (HCC)     a. EF 40-45% in 10/2014 (previously  normal in 08/2014).  . Ischemic cardiomyopathy     Past Surgical History  Procedure Laterality Date  . Esophagogastroduodenoscopy  06/10/2011    Procedure: ESOPHAGOGASTRODUODENOSCOPY (EGD);  Surgeon: Ladene Artist, MD,FACG;  Location: [facility] ENDOSCOPY;  Service: Endoscopy;  Laterality: N/A;  . Coronary artery bypass graft  2003    CABG X5  . Cardiac catheterization  2003;   . Coronary angioplasty  1998    [name] 07/13/2010  . Cataract extraction, bilateral Bilateral   . Cardiac catheterization N/A 11/07/2014    Procedure: Left Heart Cath  and Coronary Angiography;  Surgeon: Belva Crome, MD;  Location: [place] CV LAB;  Service: Cardiovascular;  Laterality: N/A;     Medications: Current Outpatient Prescriptions  Medication Sig Dispense Refill  . acetaminophen (TYLENOL) 500 MG tablet Take 500 mg by mouth daily as needed for mild pain or headache.    Marland Kitchen amLODipine (NORVASC) 10 MG tablet TAKE 1 TABLET DAILY (Patient taking differently: Take 10 mg by mouth at bedtime. ) 90 tablet 3  . aspirin EC 81 MG tablet Take 81 mg by mouth daily.    Marland Kitchen atorvastatin (LIPITOR) 80 MG tablet Take 1 tablet (80 mg total) by mouth daily at 6 PM. 30 tablet 0  . carvedilol (COREG) 25 MG tablet Take 1 tablet (25 mg total) by mouth 2 (two) times daily with a meal. 60 tablet 0  . cloNIDine (CATAPRES) 0.1 MG tablet Take 0.1 mg by mouth at bedtime.     . clopidogrel (PLAVIX) 75 MG tablet Take 1 tablet (75 mg total) by mouth daily. 30 tablet 0  . furosemide (LASIX) 40 MG tablet Take 1 tablet (40 mg total) by mouth 2 (two) times daily. 1.5 table in am 1 tablet in the pm 90 tablet 3  . insulin aspart (NOVOLOG FLEXPEN) 100 UNIT/ML FlexPen INJECT 14 UNITS INTO THE SKIN THREE TIMES DAILY WITH MEALS. 15 pen 3  . Insulin Glargine (LANTUS) 100 UNIT/ML Solostar Pen Inject 18 Units into the skin daily at 10 pm. 5 pen 3  . isosorbide mononitrate (IMDUR) 30 MG 24 hr tablet Take 3 tablets (90 mg total) by mouth daily. 90 tablet 0  . latanoprost (XALATAN) 0.005 % ophthalmic solution Place 1 drop into both eyes at bedtime.     . Melatonin 5 MG TABS Take 5 mg by mouth at bedtime as needed (use for sleep).     . nitroGLYCERIN (NITROSTAT) 0.4 MG SL tablet Place 0.4 mg under the tongue every 5 (five) minutes as needed for chest pain (3 doses MAX).    Marland Kitchen pentoxifylline (TRENTAL) 400 MG CR tablet TAKE 1 TABLET (400 MG TOTAL)  BY MOUTH 2 (TWO) TIMES DAILY AT 10 AMAND  5 PM. 180 tablet 3  . Potassium Chloride ER 20 MEQ TBCR Take 20 mEq by mouth daily. 30 tablet 3  . timolol  (TIMOPTIC) 0.25 % ophthalmic solution Place 1 drop into both eyes daily.      No current facility-administered medications for this visit.    Allergies: No Known Allergies  Social History: The patient  reports that he has quit smoking. His smoking use included Cigarettes. He has a 10 pack-year smoking history. He has never used smokeless tobacco. He reports that he does not drink alcohol or use illicit drugs.   Family History: The patient's family history includes Cancer in his mother; Coronary artery disease in an other family member; Diabetes in an other family member; Heart attack in his sister; Hypertension in  his mother and sister; Stroke in his mother.   Review of Systems: Please see the history of present illness.   Otherwise, the review of systems is positive for none.   All other systems are reviewed and negative.   Physical Exam: VS:  BP 144/88 mmHg  Pulse 69  Ht 5\' 11"  (1.803 m)  Wt 166 lb (75.297 kg)  BMI 23.16 kg/m2  SpO2 98% .  BMI Body mass index is 23.16 kg/(m^2).  Wt Readings from Last 3 Encounters:  02/04/15 166 lb (75.297 kg)  02/03/15 161 lb (73.029 kg)  01/14/15 163 lb 3.2 oz (74.027 kg)    BP by me today is 130/70  General: Pleasant. Well developed, well nourished and in no acute distress.  HEENT: Normal. Neck: Supple, no JVD, carotid bruits, or masses noted.  Cardiac: Regular rate and rhythm. Soft outflow murmur. No edema.  Respiratory:  Lungs are clear to auscultation bilaterally with normal work of breathing.  GI: Soft and nontender.  MS: No deformity or atrophy. Gait and ROM intact. Skin: Warm and dry. Color is normal.  Neuro:  Strength and sensation are intact and no gross focal deficits noted.  Psych: Alert, appropriate and with normal affect.   LABORATORY DATA:  EKG:  EKG is not ordered today.  Lab Results  Component Value Date   WBC 7.6 01/06/2015   HGB 12.1* 01/06/2015   HCT 34.8* 01/06/2015   PLT 280 01/06/2015   GLUCOSE 119*  01/15/2015   CHOL 118 11/07/2014   TRIG 129 11/07/2014   HDL 37* 11/07/2014   LDLDIRECT 93.3 01/22/2013   LDLCALC 55 11/07/2014   ALT 47 12/29/2014   AST 33 12/29/2014   NA 138 01/15/2015   K 4.3 01/15/2015   CL 103 01/15/2015   CREATININE 2.81* 01/15/2015   BUN 48* 01/15/2015   CO2 24 01/15/2015   TSH 2.846 12/29/2014   PSA 1.12 01/22/2013   INR 1.46 11/07/2014   HGBA1C 6.8* 10/01/2014   MICROALBUR 94.9* 03/28/2014    BNP (last 3 results)  Recent Labs  12/29/14 1140  BNP 1114.2*    ProBNP (last 3 results) No results for input(s): PROBNP in the last 8760 hours.   Other Studies Reviewed Today:  Echo Study Conclusions from 10/2014  - Left ventricle: The cavity size was normal. Wall thickness was normal. Systolic function was mildly to moderately reduced. The estimated ejection fraction was in the range of 40% to 45%. Akinesis of the inferior myocardium. - Mitral valve: There was mild regurgitation. - Pulmonary arteries: Systolic pressure was moderately increased. PA peak pressure: 46 mm Hg (S).   Procedures    Left Heart Cath and Coronary Angiography from 10/2014    Conclusion     Bypass graft failure with total occlusion of the SVG to the RCA, total occlusion of the SVG to the diagonal, and thrombotic high-grade stenosis throughout the entirety of the sequential saphenous vein graft to the first and second obtuse marginal.  Widely patent left internal mammary graft to the LAD. Distal LAD supplies collaterals to the right coronary.  Severe native vessel coronary disease with total occlusion of the mid LAD, severe diffuse disease in the first diagonal, 50-70% distal left main, and total occlusion of the proximal RCA.  Left ventriculography was not performed. Acute heart failure, denoted by the markedly elevated LVEDP (35-40 mmHg) is present.   RECOMMENDATIONS:   Closely monitor kidney function  IV diuretic therapy to treat acute heart  failure  IV heparin,  may help to maintain patency in the thrombosed sequential saphenous vein graft. My thought is that this graft was totally occluded resulting in marked enzyme release. On IV heparin the vessel has subsequently recanalized. We should also add Plavix to the patient's current medical regimen. Intervention on the sequential saphenous vein graft will require significant contrast. Final decision will be based upon clinical course, i.e., if recurrent angina/equivalent, may need to consider PCI.  Echo for LV assessment, with appropriate management dependent upon EF. This could include consideration of device therapy in the future if indicated.     Assessment/Plan: 1. Coronary atherosclerosis of autologous vein bypass graft without angina Failed native and saphenous vein grafts per recent cath from September. Patent LIMA to LAD. Angina is not clinically problematic at this time. Will need to manage medically. I have left him on his current regimen for now.   2. Chronic kidney disease, stage IV (severe) ([place]) Followed by Dr. Joelyn Oms. Rechecking lab today.   3. Essential hypertension Well controlled on his current regimen. Recheck by me is ok today.   4. Chronic combined systolic and diastolic HF (heart failure) ([place]) with recent exacerbation - has known LV dysfunction - looks compensated. No change in current regimen. Encouraged to weigh daily, restrict salt, etc. Recheck his lab today. His weight is up here but he says stable at home and no evidence clinically of volume overload.   5. Dyslipidemia        Current medicines are reviewed with the patient today.  The patient does not have concerns regarding medicines other than what has been noted above.  The following changes have been made:  See above.  Labs/ tests ordered today include:    Orders Placed This Encounter  Procedures  . Basic metabolic panel  . Brain natriuretic peptide     Disposition:   FU with Dr.  Tamala Julian in January as planned.   Patient is agreeable to this plan and will call if any problems develop in the interim.   Signed: Burtis Junes, RN, ANP-C 02/04/2015 10:54 AM  [facility] [address] [place], [place]  [address] Phone: [phone] Fax: [phone]

## 2015-02-05 LAB — BRAIN NATRIURETIC PEPTIDE: Brain Natriuretic Peptide: 415.2 pg/mL — ABNORMAL HIGH (ref 0.0–100.0)

## 2015-02-16 ENCOUNTER — Encounter: Payer: Self-pay | Admitting: Internal Medicine

## 2015-02-16 ENCOUNTER — Ambulatory Visit (INDEPENDENT_AMBULATORY_CARE_PROVIDER_SITE_OTHER): Payer: Commercial Managed Care - HMO | Admitting: Internal Medicine

## 2015-02-16 VITALS — BP 130/76 | HR 66 | Temp 97.7°F | Resp 20 | Ht 71.0 in | Wt 168.0 lb

## 2015-02-16 DIAGNOSIS — Z794 Long term (current) use of insulin: Secondary | ICD-10-CM

## 2015-02-16 DIAGNOSIS — I25810 Atherosclerosis of coronary artery bypass graft(s) without angina pectoris: Secondary | ICD-10-CM | POA: Diagnosis not present

## 2015-02-16 DIAGNOSIS — I10 Essential (primary) hypertension: Secondary | ICD-10-CM

## 2015-02-16 DIAGNOSIS — E0821 Diabetes mellitus due to underlying condition with diabetic nephropathy: Secondary | ICD-10-CM

## 2015-02-16 LAB — HEMOGLOBIN A1C: Hgb A1c MFr Bld: 7.7 % — ABNORMAL HIGH (ref 4.6–6.5)

## 2015-02-16 NOTE — Patient Instructions (Signed)
Limit your sodium (Salt) intake   Please check your hemoglobin A1c every 3 months  Please check your blood pressure on a regular basis.  If it is consistently greater than 150/90, please make an office appointment.   

## 2015-02-16 NOTE — Progress Notes (Signed)
Pre visit review using our clinic review tool, if applicable. No additional management support is needed unless otherwise documented below in the visit note. 

## 2015-02-16 NOTE — Progress Notes (Signed)
Subjective:    Patient ID: [name], male    DOB: 03-29-1931, 81 y.o.   MRN: [id number]  HPI  Lab Results  Component Value Date   HGBA1C 6.8* 10/01/2014   81 year old patient who is followed closely by cardiology.  He has had 3 cardiac related admissions this year.  He has diabetes and significant chronic kidney disease.  Presently doing well.  Remains on basal bolus insulin Denies any chest pain or shortness of breath Did have an eye examination December 20  Followed closely by cardiology  Past Medical History  Diagnosis Date  . CAD (coronary artery disease)     a. s/p CABG 2003. b. NSTEMI 10/2014 - cath with bypass graft failure; vessel recanalized on IV heparin, treated medically, reserving PCI for recurrent angina.  . Hyperlipidemia   . Essential hypertension   . Myocardial infarction ([place]) 1998  . Type II diabetes mellitus ([place])   . History of stomach ulcers   . Arthritis     "touch in my fingers" (09/10/2014)  . B12 deficiency anemia     "stopped taking the shots in ~ 06/2014" (09/10/2014)  . Diabetes 1.5, managed as type 1 ([place])   . CKD (chronic kidney disease), stage IV ([place])   . PUD (peptic ulcer disease) 2013  . Chronic systolic CHF (congestive heart failure) (HCC)     a. EF 40-45% in 10/2014 (previously normal in 08/2014).  . Ischemic cardiomyopathy     Social History   Social History  . Marital Status: Widowed    Spouse Name: N/A  . Number of Children: N/A  . Years of Education: N/A   Occupational History  . Not on file.   Social History Main Topics  . Smoking status: Former Smoker -- 0.50 packs/day for 20 years    Types: Cigarettes  . Smokeless tobacco: Never Used     Comment: "quit smoking cigarettes in the 1970's  . Alcohol Use: No  . Drug Use: No  . Sexual Activity: Not Currently   Other Topics Concern  . Not on file   Social History Narrative    Past Surgical History  Procedure Laterality Date  . Esophagogastroduodenoscopy   06/10/2011    Procedure: ESOPHAGOGASTRODUODENOSCOPY (EGD);  Surgeon: Ladene Artist, MD,FACG;  Location: [facility] ENDOSCOPY;  Service: Endoscopy;  Laterality: N/A;  . Coronary artery bypass graft  2003    CABG X5  . Cardiac catheterization  2003;   . Coronary angioplasty  1998    [name] 07/13/2010  . Cataract extraction, bilateral Bilateral   . Cardiac catheterization N/A 11/07/2014    Procedure: Left Heart Cath and Coronary Angiography;  Surgeon: Belva Crome, MD;  Location: [place] CV LAB;  Service: Cardiovascular;  Laterality: N/A;    Family History  Problem Relation Age of Onset  . Diabetes      brothers and sisters  . Hypertension Sister   . Coronary artery disease    . Stroke Mother   . Hypertension Mother   . Cancer Mother   . Heart attack Sister     No Known Allergies  Current Outpatient Prescriptions on File Prior to Visit  Medication Sig Dispense Refill  . acetaminophen (TYLENOL) 500 MG tablet Take 500 mg by mouth daily as needed for mild pain or headache.    Marland Kitchen amLODipine (NORVASC) 10 MG tablet TAKE 1 TABLET DAILY (Patient taking differently: Take 10 mg by mouth at bedtime. ) 90 tablet 3  . aspirin EC 81 MG  tablet Take 81 mg by mouth daily.    Marland Kitchen atorvastatin (LIPITOR) 80 MG tablet Take 1 tablet (80 mg total) by mouth daily at 6 PM. 30 tablet 0  . carvedilol (COREG) 25 MG tablet Take 1 tablet (25 mg total) by mouth 2 (two) times daily with a meal. 60 tablet 0  . cloNIDine (CATAPRES) 0.1 MG tablet Take 0.1 mg by mouth at bedtime.     . clopidogrel (PLAVIX) 75 MG tablet Take 1 tablet (75 mg total) by mouth daily. 30 tablet 0  . furosemide (LASIX) 40 MG tablet Take 1 tablet (40 mg total) by mouth 2 (two) times daily. 1.5 table in am 1 tablet in the pm 90 tablet 3  . insulin aspart (NOVOLOG FLEXPEN) 100 UNIT/ML FlexPen INJECT 14 UNITS INTO THE SKIN THREE TIMES DAILY WITH MEALS. 15 pen 3  . Insulin Glargine (LANTUS) 100 UNIT/ML Solostar Pen Inject 18 Units into the skin daily at  10 pm. 5 pen 3  . isosorbide mononitrate (IMDUR) 30 MG 24 hr tablet Take 3 tablets (90 mg total) by mouth daily. 90 tablet 0  . latanoprost (XALATAN) 0.005 % ophthalmic solution Place 1 drop into both eyes at bedtime.     . Melatonin 5 MG TABS Take 5 mg by mouth at bedtime as needed (use for sleep).     . nitroGLYCERIN (NITROSTAT) 0.4 MG SL tablet Place 0.4 mg under the tongue every 5 (five) minutes as needed for chest pain (3 doses MAX).    Marland Kitchen pentoxifylline (TRENTAL) 400 MG CR tablet TAKE 1 TABLET (400 MG TOTAL)  BY MOUTH 2 (TWO) TIMES DAILY AT 10 AMAND  5 PM. 180 tablet 3  . Potassium Chloride ER 20 MEQ TBCR Take 20 mEq by mouth daily. 30 tablet 3  . timolol (TIMOPTIC) 0.25 % ophthalmic solution Place 1 drop into both eyes daily.      No current facility-administered medications on file prior to visit.    BP 130/76 mmHg  Pulse 66  Temp(Src) 97.7 F (36.5 C) (Oral)  Resp 20  Ht 5\' 11"  (1.803 m)  Wt 168 lb (76.204 kg)  BMI 23.44 kg/m2  SpO2 99%    Review of Systems  Constitutional: Negative for fever, chills, appetite change and fatigue.  HENT: Negative for congestion, dental problem, ear pain, hearing loss, sore throat, tinnitus, trouble swallowing and voice change.   Eyes: Negative for pain, discharge and visual disturbance.  Respiratory: Negative for cough, chest tightness, wheezing and stridor.   Cardiovascular: Negative for chest pain, palpitations and leg swelling.  Gastrointestinal: Negative for nausea, vomiting, abdominal pain, diarrhea, constipation, blood in stool and abdominal distention.  Genitourinary: Negative for urgency, hematuria, flank pain, discharge, difficulty urinating and genital sores.  Musculoskeletal: Negative for myalgias, back pain, joint swelling, arthralgias, gait problem and neck stiffness.  Skin: Negative for rash.  Neurological: Negative for dizziness, syncope, speech difficulty, weakness, numbness and headaches.  Hematological: Negative for  adenopathy. Does not bruise/bleed easily.  Psychiatric/Behavioral: Negative for behavioral problems and dysphoric mood. The patient is not nervous/anxious.        Objective:   Physical Exam  Constitutional: He is oriented to person, place, and time. He appears well-developed.  HENT:  Head: Normocephalic.  Right Ear: External ear normal.  Left Ear: External ear normal.  Eyes: Conjunctivae and EOM are normal.  Neck: Normal range of motion.  Cardiovascular: Normal rate and normal heart sounds.   Pulmonary/Chest: Breath sounds normal.  Abdominal: Bowel sounds are normal.  Musculoskeletal: Normal range of motion. He exhibits no edema or tenderness.  Neurological: He is alert and oriented to person, place, and time.  Psychiatric: He has a normal mood and affect. His behavior is normal.          Assessment & Plan:   Diabetes mellitus.  Will check a hemoglobin A1c Hypertension, stable Coronary artery disease.  No change in therapy.  Follow-up cardiology Chronic kidney disease.  Follow-up nephrology  Recheck 3-4 months

## 2015-02-17 DIAGNOSIS — H401122 Primary open-angle glaucoma, left eye, moderate stage: Secondary | ICD-10-CM | POA: Diagnosis not present

## 2015-02-17 DIAGNOSIS — H401112 Primary open-angle glaucoma, right eye, moderate stage: Secondary | ICD-10-CM | POA: Diagnosis not present

## 2015-02-17 DIAGNOSIS — E113292 Type 2 diabetes mellitus with mild nonproliferative diabetic retinopathy without macular edema, left eye: Secondary | ICD-10-CM | POA: Diagnosis not present

## 2015-02-17 DIAGNOSIS — Z961 Presence of intraocular lens: Secondary | ICD-10-CM | POA: Diagnosis not present

## 2015-02-17 LAB — HM DIABETES EYE EXAM

## 2015-02-18 ENCOUNTER — Other Ambulatory Visit: Payer: Self-pay | Admitting: Internal Medicine

## 2015-03-03 DIAGNOSIS — D649 Anemia, unspecified: Secondary | ICD-10-CM | POA: Diagnosis not present

## 2015-03-03 DIAGNOSIS — N184 Chronic kidney disease, stage 4 (severe): Secondary | ICD-10-CM | POA: Diagnosis not present

## 2015-03-06 ENCOUNTER — Other Ambulatory Visit: Payer: Self-pay | Admitting: Physician Assistant

## 2015-03-06 NOTE — Telephone Encounter (Signed)
Please review for refill, Thank you. 

## 2015-03-06 NOTE — Telephone Encounter (Signed)
Potassium Chloride ER 20 MEQ TBCR  Medication   Date: 01/29/2015  Department: [address] Office  Ordering/Authorizing: Almyra Deforest, [place]      Order Providers    Prescribing Provider Encounter Provider   [place], [place] Stephannie Peters, CMA    Medication Detail      Disp Refills Start End     Potassium Chloride ER 20 MEQ TBCR 30 tablet 3 01/29/2015     Sig - Route: Take 20 mEq by mouth daily. - Oral    Notes to Pharmacy: This Rx replace previous KCl dose    E-Prescribing Status: Receipt confirmed by pharmacy (01/29/2015 8:38 AM EST)     Pharmacy    [facility] [id number] - [place], [place] [address]

## 2015-03-09 ENCOUNTER — Ambulatory Visit: Payer: Self-pay | Admitting: *Deleted

## 2015-03-09 DIAGNOSIS — N184 Chronic kidney disease, stage 4 (severe): Secondary | ICD-10-CM | POA: Diagnosis not present

## 2015-03-09 DIAGNOSIS — I10 Essential (primary) hypertension: Secondary | ICD-10-CM | POA: Diagnosis not present

## 2015-03-09 DIAGNOSIS — D649 Anemia, unspecified: Secondary | ICD-10-CM | POA: Diagnosis not present

## 2015-03-11 ENCOUNTER — Ambulatory Visit: Payer: Commercial Managed Care - HMO | Admitting: *Deleted

## 2015-03-11 ENCOUNTER — Other Ambulatory Visit: Payer: Self-pay | Admitting: Physician Assistant

## 2015-03-12 ENCOUNTER — Encounter: Payer: Self-pay | Admitting: Internal Medicine

## 2015-03-24 ENCOUNTER — Encounter: Payer: Self-pay | Admitting: Interventional Cardiology

## 2015-03-24 ENCOUNTER — Ambulatory Visit (INDEPENDENT_AMBULATORY_CARE_PROVIDER_SITE_OTHER): Payer: Commercial Managed Care - HMO | Admitting: Interventional Cardiology

## 2015-03-24 VITALS — BP 144/72 | HR 60 | Ht 71.0 in | Wt 166.8 lb

## 2015-03-24 DIAGNOSIS — I5042 Chronic combined systolic (congestive) and diastolic (congestive) heart failure: Secondary | ICD-10-CM

## 2015-03-24 DIAGNOSIS — I10 Essential (primary) hypertension: Secondary | ICD-10-CM | POA: Diagnosis not present

## 2015-03-24 DIAGNOSIS — I25810 Atherosclerosis of coronary artery bypass graft(s) without angina pectoris: Secondary | ICD-10-CM | POA: Diagnosis not present

## 2015-03-24 DIAGNOSIS — N184 Chronic kidney disease, stage 4 (severe): Secondary | ICD-10-CM

## 2015-03-24 MED ORDER — ISOSORBIDE MONONITRATE ER 120 MG PO TB24: 120.0000 mg | ORAL_TABLET | Freq: Every day | ORAL | Status: DC

## 2015-03-24 NOTE — Patient Instructions (Signed)
Medication Instructions:  Your physician has recommended you make the following change in your medication:  INCREASE Imdur to 120mg  daily. An Rx has been sent to your pharmacy   Labwork: None ordered  Testing/Procedures: None ordered  Follow-Up: Your physician wants you to follow-up in: 6 months with Dr.Smith You will receive a reminder letter in the mail two months in advance. If you don't receive a letter, please call our office to schedule the follow-up appointment.   Any Other Special Instructions Will Be Listed Below (If Applicable).     If you need a refill on your cardiac medications before your next appointment, please call your pharmacy.

## 2015-03-24 NOTE — Progress Notes (Signed)
Cardiology Office Note   Date:  03/24/2015   ID:  [name], DOB April 30, 1931, MRN [id number]  PCP:  [name], MD  Cardiologist:  [name], MD   Chief Complaint  Patient presents with  . Congestive Heart Failure      History of Present Illness: [name] is a 82 y.o. male who presents for an ischemic cardiomyopathy, bypass graft failure (2003 surgical date), large lateral wall infarction 2016 with residual LVEF by echo 40%, chronic kidney disease stage IV. Long-standing diabetes mellitus.   He has had minimal if any angina. He ambulates without significant chest discomfort. There is dyspnea with moderate physical activity. He denies orthopnea and swelling. He has not had syncope. He is tolerating his current medical regimen without difficulty.    Past Medical History  Diagnosis Date  . CAD (coronary artery disease)     a. s/p CABG 2003. b. NSTEMI 10/2014 - cath with bypass graft failure; vessel recanalized on IV heparin, treated medically, reserving PCI for recurrent angina.  . Hyperlipidemia   . Essential hypertension   . Myocardial infarction ([place]) 1998  . Type II diabetes mellitus ([place])   . History of stomach ulcers   . Arthritis     "touch in my fingers" (09/10/2014)  . B12 deficiency anemia     "stopped taking the shots in ~ 06/2014" (09/10/2014)  . Diabetes 1.5, managed as type 1 ([place])   . CKD (chronic kidney disease), stage IV ([place])   . PUD (peptic ulcer disease) 2013  . Chronic systolic CHF (congestive heart failure) (HCC)     a. EF 40-45% in 10/2014 (previously normal in 08/2014).  . Ischemic cardiomyopathy   . PVD (peripheral vascular disease) [facility])     Past Surgical History  Procedure Laterality Date  . Esophagogastroduodenoscopy  06/10/2011    Procedure: ESOPHAGOGASTRODUODENOSCOPY (EGD);  Surgeon: Ladene Artist, MD,FACG;  Location: [facility] ENDOSCOPY;  Service: Endoscopy;  Laterality: N/A;  . Coronary artery bypass graft   2003    CABG X5  . Cardiac catheterization  2003;   . Coronary angioplasty  1998    [name] 07/13/2010  . Cataract extraction, bilateral Bilateral   . Cardiac catheterization N/A 11/07/2014    Procedure: Left Heart Cath and Coronary Angiography;  Surgeon: Belva Crome, MD;  Location: [facility] CV LAB;  Service: Cardiovascular;  Laterality: N/A;     Current Outpatient Prescriptions  Medication Sig Dispense Refill  . acetaminophen (TYLENOL) 500 MG tablet Take 500 mg by mouth daily as needed for mild pain or headache.    Marland Kitchen amLODipine (NORVASC) 10 MG tablet TAKE 1 TABLET DAILY 90 tablet 3  . aspirin EC 81 MG tablet Take 81 mg by mouth daily.    Marland Kitchen atorvastatin (LIPITOR) 80 MG tablet Take 1 tablet (80 mg total) by mouth daily at 6 PM. 30 tablet 0  . carvedilol (COREG) 25 MG tablet Take 1 tablet (25 mg total) by mouth 2 (two) times daily with a meal. 60 tablet 0  . cloNIDine (CATAPRES) 0.1 MG tablet Take 0.1 mg by mouth at bedtime.     . clopidogrel (PLAVIX) 75 MG tablet Take 1 tablet (75 mg total) by mouth daily. 30 tablet 0  . furosemide (LASIX) 40 MG tablet Take 1 tablet (40 mg total) by mouth 2 (two) times daily. 1.5 table in am 1 tablet in the pm 90 tablet 3  . Insulin Glargine (LANTUS) 100 UNIT/ML Solostar Pen Inject 18 Units  into the skin daily at 10 pm. 5 pen 3  . isosorbide mononitrate (IMDUR) 120 MG 24 hr tablet Take 1 tablet (120 mg total) by mouth daily. 90 tablet 3  . latanoprost (XALATAN) 0.005 % ophthalmic solution Place 1 drop into both eyes at bedtime.     . Melatonin 5 MG TABS Take 5 mg by mouth at bedtime as needed (use for sleep).     . nitroGLYCERIN (NITROSTAT) 0.4 MG SL tablet Place 0.4 mg under the tongue every 5 (five) minutes as needed for chest pain (3 doses MAX).    Marland Kitchen NOVOLOG FLEXPEN 100 UNIT/ML FlexPen Inject 14 Units into the skin 3 (three) times daily.    . pentoxifylline (TRENTAL) 400 MG CR tablet TAKE 1 TABLET (400 MG TOTAL)  BY MOUTH 2 (TWO) TIMES DAILY AT 10 AMAND   5 PM. 180 tablet 3  . Potassium Chloride ER 20 MEQ TBCR Take 20 mEq by mouth daily. 30 tablet 3  . timolol (TIMOPTIC) 0.25 % ophthalmic solution Place 1 drop into both eyes daily.      No current facility-administered medications for this visit.    Allergies:   Review of patient's allergies indicates no known allergies.    Social History:  The patient  reports that he has quit smoking. His smoking use included Cigarettes. He has a 10 pack-year smoking history. He has never used smokeless tobacco. He reports that he does not drink alcohol or use illicit drugs.   Family History:  The patient's family history includes Cancer in his mother; Heart attack in his sister; Hypertension in his mother and sister; Stroke in his mother.    ROS:  Please see the history of present illness.   Otherwise, review of systems are positive for complain but does have bilateral knee discomfort. Appetite is been stable..   All other systems are reviewed and negative.    PHYSICAL EXAM: VS:  BP 144/72 mmHg  Pulse 60  Ht 5\' 11"  (1.803 m)  Wt 166 lb 12.8 oz (75.66 kg)  BMI 23.27 kg/m2 , BMI Body mass index is 23.27 kg/(m^2). GEN: Well nourished, well developed, in no acute distress HEENT: normal Neck: no JVD, carotid bruits, or masses Cardiac: RRR.  There is no murmur, rub, but there is an S4 gallop. There is no edema. Respiratory:  clear to auscultation bilaterally, normal work of breathing. GI: soft, nontender, nondistended, + BS MS: no deformity or atrophy Skin: warm and dry, no rash Neuro:  Strength and sensation are intact Psych: euthymic mood, full affect   EKG:  EKG is not ordered today.    Recent Labs: 09/10/2014: Magnesium 2.1 12/29/2014: ALT 47; B Natriuretic Peptide 1114.2*; TSH 2.846 01/06/2015: Hemoglobin 12.1*; Platelets 280 02/04/2015: BUN 41*; Creat 3.09*; Potassium 4.3; Sodium 139    Lipid Panel    Component Value Date/Time   CHOL 118 11/07/2014 0358   TRIG 129 11/07/2014 0358    HDL 37* 11/07/2014 0358   CHOLHDL 3.2 11/07/2014 0358   VLDL 26 11/07/2014 0358   LDLCALC 55 11/07/2014 0358   LDLDIRECT 93.3 01/22/2013 0854      Wt Readings from Last 3 Encounters:  03/24/15 166 lb 12.8 oz (75.66 kg)  02/16/15 168 lb (76.204 kg)  02/04/15 166 lb (75.297 kg)      Other studies Reviewed: Additional studies/ records that were reviewed today include: Reviewed echocardiogram. The findings include LVEF 40%.    ASSESSMENT AND PLAN:  1. Chronic combined systolic and diastolic HF (heart  failure) (HCC) No evidence of volume overload  2. 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 Coronary circulation via LIMA to LAD. All other conduits and native circulation are occluded.  3. Chronic kidney disease, stage IV (severe) (HCC) Last creatinine was 3.1  4. Essential hypertension Controlled    Current medicines are reviewed at length with the patient today.  The patient has the following concerns regarding medicines: Wanted to know which medications were for his heart..  The following changes/actions have been instituted:    Increase isosorbide mononitrate to 120 mg per day.  Considered discontinuation of amlodipine and starting hydralazine  Call if fluid retention. Call if decreased urine output.  Labs/ tests ordered today include:  No orders of the defined types were placed in this encounter.     Disposition:   FU with HS in 6 months  Signed, [name], MD  03/24/2015 8:43 AM    [facility] [address], [place], [place]  [address] Phone: [phone]; Fax: [phone]

## 2015-03-25 ENCOUNTER — Other Ambulatory Visit: Payer: Self-pay | Admitting: *Deleted

## 2015-03-25 NOTE — Patient Outreach (Signed)
[facility]) Care Management   03/25/2015  [name] 1932/01/24 [id number]  [name] is an 82 y.o. male  Subjective:  HF: Pt able to receite some of the signs and symptoms but need review. Pt denies any issues with his HF and states he has followed u pwith all his providers with good reports. Pt has admitted that he has not weight daily and has not documented all weights but aware when he gains some weight.  Pt states she has taken all her prescribed medications with no delays and continues to do well in managing his HF. Denies any swelling to his hands, feet or trunk area. Pt is aware that managing his HF is important and will attempt to improve in managing his HF closer.     Objective:   Review of Systems  Constitutional: Negative.   HENT: Negative.   Eyes: Negative.   Respiratory: Negative.   Cardiovascular: Negative.   Gastrointestinal: Negative.   Genitourinary: Negative.   Musculoskeletal: Negative.   Skin: Negative.   Neurological: Negative.   Endo/Heme/Allergies: Negative.   Psychiatric/Behavioral: Negative.     Physical Exam  Constitutional: He is oriented to person, place, and time. He appears well-developed and well-nourished.  HENT:  Right Ear: External ear normal.  Left Ear: External ear normal.  Nose: Nose normal.  Eyes: EOM are normal.  Neck: Normal range of motion.  Cardiovascular: Normal heart sounds.   Respiratory: Effort normal and breath sounds normal.  GI: Soft. Bowel sounds are normal.  Musculoskeletal: Normal range of motion.  Neurological: He is alert and oriented to person, place, and time.  Skin: Skin is warm and dry.  Psychiatric: He has a normal mood and affect. His behavior is normal. Judgment and thought content normal.    Current Medications:   Current Outpatient Prescriptions  Medication Sig Dispense Refill  . acetaminophen (TYLENOL) 500 MG tablet Take 500 mg by mouth daily as needed for mild pain or  headache.    Marland Kitchen amLODipine (NORVASC) 10 MG tablet TAKE 1 TABLET DAILY 90 tablet 3  . aspirin EC 81 MG tablet Take 81 mg by mouth daily.    Marland Kitchen atorvastatin (LIPITOR) 80 MG tablet Take 1 tablet (80 mg total) by mouth daily at 6 PM. 30 tablet 0  . carvedilol (COREG) 25 MG tablet Take 1 tablet (25 mg total) by mouth 2 (two) times daily with a meal. 60 tablet 0  . cloNIDine (CATAPRES) 0.1 MG tablet Take 0.1 mg by mouth at bedtime.     . clopidogrel (PLAVIX) 75 MG tablet Take 1 tablet (75 mg total) by mouth daily. 30 tablet 0  . furosemide (LASIX) 40 MG tablet Take 1 tablet (40 mg total) by mouth 2 (two) times daily. 1.5 table in am 1 tablet in the pm 90 tablet 3  . Insulin Glargine (LANTUS) 100 UNIT/ML Solostar Pen Inject 18 Units into the skin daily at 10 pm. 5 pen 3  . isosorbide mononitrate (IMDUR) 120 MG 24 hr tablet Take 1 tablet (120 mg total) by mouth daily. 90 tablet 3  . latanoprost (XALATAN) 0.005 % ophthalmic solution Place 1 drop into both eyes at bedtime.     . Melatonin 5 MG TABS Take 5 mg by mouth at bedtime as needed (use for sleep).     . nitroGLYCERIN (NITROSTAT) 0.4 MG SL tablet Place 0.4 mg under the tongue every 5 (five) minutes as needed for chest pain (3 doses MAX).    Marland Kitchen  NOVOLOG FLEXPEN 100 UNIT/ML FlexPen Inject 14 Units into the skin 3 (three) times daily.    . pentoxifylline (TRENTAL) 400 MG CR tablet TAKE 1 TABLET (400 MG TOTAL)  BY MOUTH 2 (TWO) TIMES DAILY AT 10 AMAND  5 PM. 180 tablet 3  . Potassium Chloride ER 20 MEQ TBCR Take 20 mEq by mouth daily. 30 tablet 3  . timolol (TIMOPTIC) 0.25 % ophthalmic solution Place 1 drop into both eyes daily.      No current facility-administered medications for this visit.    Functional Status:   In your present state of health, do you have any difficulty performing the following activities: 12/29/2014 12/25/2014  Hearing? N N  Vision? N N  Difficulty concentrating or making decisions? N N  Walking or climbing stairs? N N   Dressing or bathing? N N  Doing errands, shopping? N N  Preparing Food and eating ? - N  Using the Toilet? - N  In the past six months, have you accidently leaked urine? - N  Do you have problems with loss of bowel control? - N  Managing your Medications? - N  Managing your Finances? - N  Housekeeping or managing your Housekeeping? - N    Fall/Depression Screening:    PHQ 2/9 Scores 02/03/2015 12/16/2014 03/28/2014 10/24/2012  PHQ - 2 Score 0 0 0 0   Filed Vitals:   03/25/15 1000  BP: 120/60  Pulse: 73  Resp: 20    Assessment:   Follow up on knowledge base related to HF Follow up on adherence with daily weights and documentation  Plan:  Physical assessment completed with no acute signs or symptoms mentioned on this home visits. Will verified pt's knowledge based with reciting some signs or symptoms related to his HF however pt continues to need review as completed today. Will once again intervene on pt's increased knowledge based on the next home visit. Verified pt has educational material to review in calendar for signs and symptoms and what to do. Will review this material and continued education. Verified pt remains in the GREEN zone after thorough review of the HF signs and symptoms. No request or inquires as pt has agreed to another home visit next month to refresh on educational material provided from previous home visit.  Will stress the importance of daily monitoring and documenting all weights for his providers to view in managing his care. Pt will attempt to monitor his HF closer with daily weights over the next month prior to the next home visit.  Plan of care review and goals adjusted to allow adherence to the plan of care. Will re-evaluate next home visit.   Brandon Mina, RN Care Management Coordinator [place] Office [phone]

## 2015-04-01 ENCOUNTER — Other Ambulatory Visit: Payer: Self-pay | Admitting: Internal Medicine

## 2015-04-01 ENCOUNTER — Other Ambulatory Visit: Payer: Self-pay | Admitting: Physician Assistant

## 2015-04-01 NOTE — Telephone Encounter (Signed)
Please review for refill, Thank you. 

## 2015-04-02 NOTE — Telephone Encounter (Signed)
Lasix 40mg  1.5 tab in the am and 1 tab in the pm

## 2015-04-02 NOTE — Telephone Encounter (Signed)
Patients chart has two different sigs listed for this medication. Please advise as to what current therapy should be. Thanks, MI

## 2015-04-06 ENCOUNTER — Other Ambulatory Visit: Payer: Self-pay | Admitting: Internal Medicine

## 2015-04-22 ENCOUNTER — Other Ambulatory Visit: Payer: Self-pay | Admitting: *Deleted

## 2015-04-22 ENCOUNTER — Encounter: Payer: Self-pay | Admitting: *Deleted

## 2015-04-22 NOTE — Patient Outreach (Signed)
[facility]) Care Management   04/22/2015  ELAD MACPHAIL [birth year] [id number]  [name] is an 82 y.o. male  Subjective:  HF: Pt reports she is monitoring his HF well ad reports his weight at 163 lbs over the last week. Last visit to his doctor as pt reports an update. Pt states he is in the GREEN zone with no encoutnered problems or issues since RN last home visit. Pt states he is able to manager his HF and Diabetes with no problems. Pt states he is able to if he is in and urgent situation and needs medical attention immediately. Pt feels he is able to catch symptoms early enough and seek medical attention. Pt continues to workout at the local [employer] [facility] [employer] program twice weekly to maintain his health.  DM: Pt reports he readings are good and continues to eat health in regulating his blood sugar levels.  Pt verified supplies and has no needs at this time.  Pt feels comfortable being discharge from the [facility] program and has opt to declined a health coach for ongoing telephonic services.  Pt verified he has RN contact number and [facility] for the main office if needed in the future. Pt very grateful and thanks for the months of education. No other inquired or request at this time.   Objective:   Review of Systems  Constitutional: Negative.   HENT: Negative.   Eyes: Negative.   Respiratory: Negative.   Cardiovascular: Negative.   Gastrointestinal: Negative.   Genitourinary: Negative.   Musculoskeletal: Negative.   Skin: Negative.   Neurological: Negative.   Endo/Heme/Allergies: Negative.   Psychiatric/Behavioral: Negative.     Physical Exam  Constitutional: He is oriented to person, place, and time. He appears well-developed and well-nourished.  HENT:  Right Ear: External ear normal.  Left Ear: External ear normal.  Eyes: EOM are normal.  Neck: Normal range of motion.  Cardiovascular: Normal heart sounds.   Respiratory: Effort normal and  breath sounds normal.  GI: Soft. Bowel sounds are normal.  Musculoskeletal: Normal range of motion.  Neurological: He is alert and oriented to person, place, and time.  Skin: Skin is warm and dry.  Psychiatric: He has a normal mood and affect. His behavior is normal. Judgment and thought content normal.    Current Medications:   Current Outpatient Prescriptions  Medication Sig Dispense Refill  . acetaminophen (TYLENOL) 500 MG tablet Take 500 mg by mouth daily as needed for mild pain or headache.    Marland Kitchen amLODipine (NORVASC) 10 MG tablet TAKE 1 TABLET DAILY 90 tablet 3  . aspirin EC 81 MG tablet Take 81 mg by mouth daily.    Marland Kitchen atorvastatin (LIPITOR) 80 MG tablet Take 1 tablet (80 mg total) by mouth daily at 6 PM. 30 tablet 0  . carvedilol (COREG) 25 MG tablet Take 1 tablet (25 mg total) by mouth 2 (two) times daily with a meal. 60 tablet 0  . cloNIDine (CATAPRES) 0.1 MG tablet TAKE 1 TABLET AT BEDTIME 90 tablet 3  . clopidogrel (PLAVIX) 75 MG tablet Take 1 tablet (75 mg total) by mouth daily. 30 tablet 0  . furosemide (LASIX) 40 MG tablet Take one-half tablet by mouth in the AM and one tablet by mouth in the PM (Patient taking differently: Take 40 mg by mouth. Take 1 & 1/2 tablet  by mouth in the AM and 1 tablet by mouth in the PM) 135 tablet 3  . isosorbide mononitrate (  IMDUR) 120 MG 24 hr tablet Take 1 tablet (120 mg total) by mouth daily. 90 tablet 3  . LANTUS SOLOSTAR 100 UNIT/ML Solostar Pen INJECT 14 UNITS INTO THE SKIN DAILY AT 10 PM. 15 mL 3  . latanoprost (XALATAN) 0.005 % ophthalmic solution Place 1 drop into both eyes at bedtime.     . Melatonin 5 MG TABS Take 5 mg by mouth at bedtime as needed (use for sleep).     . nitroGLYCERIN (NITROSTAT) 0.4 MG SL tablet Place 0.4 mg under the tongue every 5 (five) minutes as needed for chest pain (3 doses MAX).    Marland Kitchen NOVOLOG FLEXPEN 100 UNIT/ML FlexPen Inject 14 Units into the skin 3 (three) times daily.    . pentoxifylline (TRENTAL) 400 MG CR  tablet TAKE 1 TABLET TWICE DAILY  AT  10AM  AND  5PM 180 tablet 3  . Potassium Chloride ER 20 MEQ TBCR Take 20 mEq by mouth daily. 30 tablet 3  . timolol (TIMOPTIC) 0.25 % ophthalmic solution Place 1 drop into both eyes daily.      No current facility-administered medications for this visit.    Functional Status:   In your present state of health, do you have any difficulty performing the following activities: 04/22/2015 12/29/2014  Hearing? N N  Vision? N N  Difficulty concentrating or making decisions? N N  Walking or climbing stairs? N N  Dressing or bathing? N N  Doing errands, shopping? N N  Preparing Food and eating ? N -  Using the Toilet? N -  In the past six months, have you accidently leaked urine? N -  Do you have problems with loss of bowel control? N -  Managing your Medications? N -  Managing your Finances? N -  Housekeeping or managing your Housekeeping? N -    Fall/Depression Screening:    PHQ 2/9 Scores 04/22/2015 02/03/2015 12/16/2014 03/28/2014 10/24/2012  PHQ - 2 Score 0 0 0 0 0   Filed Vitals:   04/22/15 0947  BP: 122/68  Pulse: 53  Resp: 20    Assessment:   Follow on case management related to HF Follow up diabetes management related to Diabetes  Plan:  Physical assessment completed with no acute signs or symptoms mentioned or noted today. Will verified pt remains in the GREEN zone with no symptoms of swelling and pt reports his weight remaining at 163 lbs over the last week. Last follow up with primary in November with a good report. Will verify pt continues to weight daily and able to recognize symptoms and aware of what to do if acute symptoms should occur.  Verified pt is weighing everyday and able to recognize acute  Symptoms if encountered and what to do. Will verify pt is able to response quickly with any signs or symptoms related to his HF to prevent acute events.  Will verify pt monitoring his CBG regularly with good readings and no reported issues  or acute events have occurred.  Plan of care reviewed with all goals met as pt able to manager his medical issues independently with the ongoing educational information presented in the past that pt is able to refer to if needed. No other resources or information needed at this time. Verified pt has this RN and [facility] contact number if needed in the future. Based upon pt's management of care pt will be discharge from the [facility] services today. RN has offered a head coach for ongoing telephonic disease management services however  pt declined and feels he is able to again manage his care independently. Case will be closured at this time.  [name], RN Care Management Coordinator [place] Office [phone]

## 2015-04-27 ENCOUNTER — Other Ambulatory Visit: Payer: Self-pay | Admitting: *Deleted

## 2015-04-27 MED ORDER — POTASSIUM CHLORIDE ER 20 MEQ PO TBCR: 20.0000 meq | EXTENDED_RELEASE_TABLET | Freq: Every day | ORAL | Status: DC

## 2015-04-27 NOTE — Telephone Encounter (Signed)
Spoke to pt, told him received message on my voicemail about Rx refill for Potassium. Told him Rx sent to [facility]. Pt verbalized understanding.

## 2015-05-10 ENCOUNTER — Encounter (HOSPITAL_COMMUNITY): Payer: Self-pay | Admitting: Emergency Medicine

## 2015-05-10 ENCOUNTER — Inpatient Hospital Stay (HOSPITAL_COMMUNITY)
Admission: EM | Admit: 2015-05-10 | Discharge: 2015-05-14 | DRG: 377 | Disposition: A | Discharge status: Home / Self Care | Payer: Commercial Managed Care - HMO | Attending: Internal Medicine | Admitting: Internal Medicine

## 2015-05-10 DIAGNOSIS — I739 Peripheral vascular disease, unspecified: Secondary | ICD-10-CM | POA: Diagnosis not present

## 2015-05-10 DIAGNOSIS — D631 Anemia in chronic kidney disease: Secondary | ICD-10-CM | POA: Diagnosis present

## 2015-05-10 DIAGNOSIS — Z8249 Family history of ischemic heart disease and other diseases of the circulatory system: Secondary | ICD-10-CM | POA: Diagnosis not present

## 2015-05-10 DIAGNOSIS — Z8711 Personal history of peptic ulcer disease: Secondary | ICD-10-CM | POA: Diagnosis not present

## 2015-05-10 DIAGNOSIS — E861 Hypovolemia: Secondary | ICD-10-CM | POA: Diagnosis present

## 2015-05-10 DIAGNOSIS — Z833 Family history of diabetes mellitus: Secondary | ICD-10-CM

## 2015-05-10 DIAGNOSIS — Z951 Presence of aortocoronary bypass graft: Secondary | ICD-10-CM | POA: Diagnosis present

## 2015-05-10 DIAGNOSIS — E119 Type 2 diabetes mellitus without complications: Secondary | ICD-10-CM | POA: Diagnosis present

## 2015-05-10 DIAGNOSIS — M79602 Pain in left arm: Secondary | ICD-10-CM | POA: Diagnosis not present

## 2015-05-10 DIAGNOSIS — I5042 Chronic combined systolic (congestive) and diastolic (congestive) heart failure: Secondary | ICD-10-CM | POA: Diagnosis present

## 2015-05-10 DIAGNOSIS — K5791 Diverticulosis of intestine, part unspecified, without perforation or abscess with bleeding: Principal | ICD-10-CM | POA: Diagnosis present

## 2015-05-10 DIAGNOSIS — N189 Chronic kidney disease, unspecified: Secondary | ICD-10-CM | POA: Diagnosis present

## 2015-05-10 DIAGNOSIS — I5043 Acute on chronic combined systolic (congestive) and diastolic (congestive) heart failure: Secondary | ICD-10-CM | POA: Diagnosis not present

## 2015-05-10 DIAGNOSIS — E1122 Type 2 diabetes mellitus with diabetic chronic kidney disease: Secondary | ICD-10-CM | POA: Diagnosis present

## 2015-05-10 DIAGNOSIS — K921 Melena: Secondary | ICD-10-CM

## 2015-05-10 DIAGNOSIS — I255 Ischemic cardiomyopathy: Secondary | ICD-10-CM | POA: Diagnosis present

## 2015-05-10 DIAGNOSIS — Z7902 Long term (current) use of antithrombotics/antiplatelets: Secondary | ICD-10-CM | POA: Diagnosis not present

## 2015-05-10 DIAGNOSIS — E1129 Type 2 diabetes mellitus with other diabetic kidney complication: Secondary | ICD-10-CM | POA: Diagnosis present

## 2015-05-10 DIAGNOSIS — D62 Acute posthemorrhagic anemia: Secondary | ICD-10-CM | POA: Diagnosis present

## 2015-05-10 DIAGNOSIS — I25810 Atherosclerosis of coronary artery bypass graft(s) without angina pectoris: Secondary | ICD-10-CM | POA: Diagnosis present

## 2015-05-10 DIAGNOSIS — Z87891 Personal history of nicotine dependence: Secondary | ICD-10-CM | POA: Diagnosis not present

## 2015-05-10 DIAGNOSIS — Z7982 Long term (current) use of aspirin: Secondary | ICD-10-CM | POA: Diagnosis not present

## 2015-05-10 DIAGNOSIS — I9589 Other hypotension: Secondary | ICD-10-CM | POA: Diagnosis present

## 2015-05-10 DIAGNOSIS — I252 Old myocardial infarction: Secondary | ICD-10-CM | POA: Diagnosis not present

## 2015-05-10 DIAGNOSIS — N179 Acute kidney failure, unspecified: Secondary | ICD-10-CM | POA: Diagnosis not present

## 2015-05-10 DIAGNOSIS — N185 Chronic kidney disease, stage 5: Secondary | ICD-10-CM | POA: Diagnosis present

## 2015-05-10 DIAGNOSIS — Z794 Long term (current) use of insulin: Secondary | ICD-10-CM | POA: Diagnosis not present

## 2015-05-10 DIAGNOSIS — I959 Hypotension, unspecified: Secondary | ICD-10-CM | POA: Diagnosis present

## 2015-05-10 DIAGNOSIS — M79603 Pain in arm, unspecified: Secondary | ICD-10-CM | POA: Diagnosis not present

## 2015-05-10 DIAGNOSIS — N184 Chronic kidney disease, stage 4 (severe): Secondary | ICD-10-CM | POA: Diagnosis present

## 2015-05-10 DIAGNOSIS — Z86010 Personal history of colonic polyps: Secondary | ICD-10-CM | POA: Diagnosis not present

## 2015-05-10 DIAGNOSIS — I25700 Atherosclerosis of coronary artery bypass graft(s), unspecified, with unstable angina pectoris: Secondary | ICD-10-CM | POA: Diagnosis not present

## 2015-05-10 DIAGNOSIS — K5790 Diverticulosis of intestine, part unspecified, without perforation or abscess without bleeding: Secondary | ICD-10-CM | POA: Diagnosis not present

## 2015-05-10 DIAGNOSIS — E1151 Type 2 diabetes mellitus with diabetic peripheral angiopathy without gangrene: Secondary | ICD-10-CM | POA: Diagnosis present

## 2015-05-10 DIAGNOSIS — N289 Disorder of kidney and ureter, unspecified: Secondary | ICD-10-CM | POA: Diagnosis not present

## 2015-05-10 DIAGNOSIS — K5730 Diverticulosis of large intestine without perforation or abscess without bleeding: Secondary | ICD-10-CM | POA: Diagnosis not present

## 2015-05-10 DIAGNOSIS — I10 Essential (primary) hypertension: Secondary | ICD-10-CM | POA: Diagnosis not present

## 2015-05-10 DIAGNOSIS — E785 Hyperlipidemia, unspecified: Secondary | ICD-10-CM | POA: Diagnosis not present

## 2015-05-10 DIAGNOSIS — I2510 Atherosclerotic heart disease of native coronary artery without angina pectoris: Secondary | ICD-10-CM | POA: Diagnosis present

## 2015-05-10 DIAGNOSIS — K922 Gastrointestinal hemorrhage, unspecified: Secondary | ICD-10-CM | POA: Diagnosis not present

## 2015-05-10 DIAGNOSIS — I130 Hypertensive heart and chronic kidney disease with heart failure and stage 1 through stage 4 chronic kidney disease, or unspecified chronic kidney disease: Secondary | ICD-10-CM | POA: Diagnosis not present

## 2015-05-10 DIAGNOSIS — R809 Proteinuria, unspecified: Secondary | ICD-10-CM

## 2015-05-10 HISTORY — DX: Hypertensive heart disease without heart failure: I11.9

## 2015-05-10 LAB — COMPREHENSIVE METABOLIC PANEL
ALBUMIN: 3.7 g/dL (ref 3.5–5.0)
ALT: 12 U/L — ABNORMAL LOW (ref 17–63)
AST: 16 U/L (ref 15–41)
Alkaline Phosphatase: 63 U/L (ref 38–126)
Anion gap: 14 (ref 5–15)
BUN: 69 mg/dL — AB (ref 6–20)
CHLORIDE: 105 mmol/L (ref 101–111)
CO2: 19 mmol/L — AB (ref 22–32)
Calcium: 9.1 mg/dL (ref 8.9–10.3)
Creatinine, Ser: 4.4 mg/dL — ABNORMAL HIGH (ref 0.61–1.24)
GFR calc Af Amer: 13 mL/min — ABNORMAL LOW (ref >60)
GFR calc non Af Amer: 11 mL/min — ABNORMAL LOW (ref >60)
GLUCOSE: 136 mg/dL — AB (ref 65–99)
POTASSIUM: 4.4 mmol/L (ref 3.5–5.1)
Sodium: 138 mmol/L (ref 135–145)
Total Bilirubin: 0.5 mg/dL (ref 0.3–1.2)
Total Protein: 6.8 g/dL (ref 6.5–8.1)

## 2015-05-10 LAB — GLUCOSE, CAPILLARY
GLUCOSE-CAPILLARY: 192 mg/dL — AB (ref 65–99)
GLUCOSE-CAPILLARY: 190 mg/dL — AB (ref 65–99)

## 2015-05-10 LAB — CBC
HCT: 27 % — ABNORMAL LOW (ref 39.0–52.0)
HEMATOCRIT: 27.9 % — AB (ref 39.0–52.0)
HEMOGLOBIN: 8.5 g/dL — AB (ref 13.0–17.0)
Hemoglobin: 8.8 g/dL — ABNORMAL LOW (ref 13.0–17.0)
MCH: 26.3 pg (ref 26.0–34.0)
MCH: 26.4 pg (ref 26.0–34.0)
MCHC: 31.5 g/dL (ref 30.0–36.0)
MCHC: 31.5 g/dL (ref 30.0–36.0)
MCV: 83.5 fL (ref 78.0–100.0)
MCV: 83.9 fL (ref 78.0–100.0)
Platelets: 219 10*3/uL (ref 150–400)
Platelets: 190 10*3/uL (ref 150–400)
RBC: 3.34 MIL/uL — ABNORMAL LOW (ref 4.22–5.81)
RBC: 3.22 MIL/uL — AB (ref 4.22–5.81)
RDW: 17.1 % — AB (ref 11.5–15.5)
RDW: 16.6 % — ABNORMAL HIGH (ref 11.5–15.5)
WBC: 8.7 10*3/uL (ref 4.0–10.5)
WBC: 6.8 10*3/uL (ref 4.0–10.5)

## 2015-05-10 LAB — PREPARE RBC (CROSSMATCH)

## 2015-05-10 LAB — APTT: aPTT: 23 seconds — ABNORMAL LOW (ref 24–37)

## 2015-05-10 LAB — PROTIME-INR
INR: 1.23 (ref 0.00–1.49)
PROTHROMBIN TIME: 15.7 s — AB (ref 11.6–15.2)

## 2015-05-10 LAB — MRSA PCR SCREENING: MRSA by PCR: NEGATIVE

## 2015-05-10 LAB — POC OCCULT BLOOD, ED: Fecal Occult Bld: POSITIVE — AB

## 2015-05-10 MED ORDER — LATANOPROST 0.005 % OP SOLN
1.0000 [drp] | Freq: Every day | OPHTHALMIC | Status: DC
  Administered 2015-05-10 – 2015-05-13 (×4): 1 [drp] via OPHTHALMIC
  Filled 2015-05-10: qty 2.5

## 2015-05-10 MED ORDER — ONDANSETRON HCL 4 MG PO TABS: 4.0000 mg | ORAL_TABLET | Freq: Four times a day (QID) | ORAL | Status: DC | PRN

## 2015-05-10 MED ORDER — SODIUM CHLORIDE 0.9 % IV BOLUS (SEPSIS)
500.0000 mL | Freq: Once | INTRAVENOUS | Status: AC
  Administered 2015-05-10: 500 mL via INTRAVENOUS

## 2015-05-10 MED ORDER — ACETAMINOPHEN 500 MG PO TABS: 1000.0000 mg | ORAL_TABLET | Freq: Four times a day (QID) | ORAL | Status: DC | PRN

## 2015-05-10 MED ORDER — HYDROCODONE-ACETAMINOPHEN 5-325 MG PO TABS: 1.0000 | ORAL_TABLET | ORAL | Status: DC | PRN

## 2015-05-10 MED ORDER — ACETAMINOPHEN 325 MG PO TABS: 650.0000 mg | ORAL_TABLET | Freq: Four times a day (QID) | ORAL | Status: DC | PRN

## 2015-05-10 MED ORDER — CARVEDILOL 25 MG PO TABS
25.0000 mg | ORAL_TABLET | Freq: Two times a day (BID) | ORAL | Status: DC
  Administered 2015-05-10 – 2015-05-14 (×8): 25 mg via ORAL
  Filled 2015-05-10 (×8): qty 1

## 2015-05-10 MED ORDER — ACETAMINOPHEN 650 MG RE SUPP: 650.0000 mg | Freq: Four times a day (QID) | RECTAL | Status: DC | PRN

## 2015-05-10 MED ORDER — TIMOLOL MALEATE 0.5 % OP SOLN
1.0000 [drp] | Freq: Every morning | OPHTHALMIC | Status: DC
  Administered 2015-05-11 – 2015-05-14 (×4): 1 [drp] via OPHTHALMIC
  Filled 2015-05-10: qty 5

## 2015-05-10 MED ORDER — ATORVASTATIN CALCIUM 80 MG PO TABS
80.0000 mg | ORAL_TABLET | Freq: Every day | ORAL | Status: DC
  Administered 2015-05-10 – 2015-05-13 (×4): 80 mg via ORAL
  Filled 2015-05-10 (×4): qty 1

## 2015-05-10 MED ORDER — SODIUM CHLORIDE 0.9 % IV SOLN
INTRAVENOUS | Status: DC
  Administered 2015-05-10 – 2015-05-12 (×5): via INTRAVENOUS

## 2015-05-10 MED ORDER — PENTOXIFYLLINE ER 400 MG PO TBCR
400.0000 mg | EXTENDED_RELEASE_TABLET | Freq: Two times a day (BID) | ORAL | Status: DC
  Administered 2015-05-10 – 2015-05-14 (×7): 400 mg via ORAL
  Filled 2015-05-10 (×13): qty 1

## 2015-05-10 MED ORDER — ASPIRIN EC 81 MG PO TBEC
81.0000 mg | DELAYED_RELEASE_TABLET | Freq: Every day | ORAL | Status: DC
  Administered 2015-05-11 – 2015-05-13 (×3): 81 mg via ORAL
  Filled 2015-05-10 (×3): qty 1

## 2015-05-10 MED ORDER — SODIUM CHLORIDE 0.9 % IV SOLN
Freq: Once | INTRAVENOUS | Status: AC
  Administered 2015-05-10: 13:00:00 via INTRAVENOUS

## 2015-05-10 MED ORDER — ONDANSETRON HCL 4 MG/2ML IJ SOLN: 4.0000 mg | Freq: Four times a day (QID) | INTRAMUSCULAR | Status: DC | PRN

## 2015-05-10 MED ORDER — DIPHENHYDRAMINE HCL 25 MG PO CAPS
25.0000 mg | ORAL_CAPSULE | Freq: Once | ORAL | Status: AC
  Administered 2015-05-10: 25 mg via ORAL
  Filled 2015-05-10: qty 1

## 2015-05-10 MED ORDER — INSULIN ASPART 100 UNIT/ML ~~LOC~~ SOLN
0.0000 [IU] | Freq: Three times a day (TID) | SUBCUTANEOUS | Status: DC
  Administered 2015-05-10: 2 [IU] via SUBCUTANEOUS
  Administered 2015-05-11: 3 [IU] via SUBCUTANEOUS
  Administered 2015-05-11: 2 [IU] via SUBCUTANEOUS

## 2015-05-10 MED ORDER — PANTOPRAZOLE SODIUM 40 MG IV SOLR
40.0000 mg | Freq: Two times a day (BID) | INTRAVENOUS | Status: DC
  Administered 2015-05-10 (×2): 40 mg via INTRAVENOUS
  Filled 2015-05-10 (×2): qty 40

## 2015-05-10 MED ORDER — ONDANSETRON HCL 4 MG/2ML IJ SOLN: 4.0000 mg | Freq: Once | INTRAMUSCULAR | Status: DC

## 2015-05-10 MED ORDER — SODIUM CHLORIDE 0.9% FLUSH
3.0000 mL | Freq: Two times a day (BID) | INTRAVENOUS | Status: DC
  Administered 2015-05-10 – 2015-05-12 (×4): 3 mL via INTRAVENOUS
  Administered 2015-05-13: 10 mL via INTRAVENOUS
  Administered 2015-05-13: 3 mL via INTRAVENOUS

## 2015-05-10 NOTE — ED Provider Notes (Signed)
CSN: [id number]     Arrival date & time 05/10/15  1019 History   First MD Initiated Contact with Patient 05/10/15 1041     Chief Complaint  Patient presents with  . Blood In Stools     (Consider location/radiation/quality/duration/timing/severity/associated sxs/prior Treatment) HPI Patient presents with multiple episodes of passing bright red bloody stool. States that the symptoms started yesterday. He thinks he may have had 3-4 bloody bowel movements. Denies any associated abdominal pain. No previously similar symptoms. Had episode of mild nausea in the emergency department but denies vomiting. No fever or chills. Patient takes Plavix daily for coronary artery disease status post CABG. Past Medical History  Diagnosis Date  . CAD (coronary artery disease)     a. s/p CABG 2003. b. NSTEMI 10/2014 - cath with bypass graft failure; vessel recanalized on IV heparin, treated medically, reserving PCI for recurrent angina.  . Hyperlipidemia   . Essential hypertension   . Myocardial infarction ([place]) 1998  . Type II diabetes mellitus ([place])   . History of stomach ulcers 2013  . Arthritis     "touch in my fingers" (09/10/2014)  . B12 deficiency anemia     "stopped taking the shots in ~ 06/2014" (09/10/2014)  . Diabetes 1.5, managed as type 1 ([place])   . CKD (chronic kidney disease), stage IV ([place])   . Chronic systolic CHF (congestive heart failure) (HCC)     a. EF 40-45% in 10/2014 (previously normal in 08/2014).  . Ischemic cardiomyopathy   . PVD (peripheral vascular disease) [facility])    Past Surgical History  Procedure Laterality Date  . Esophagogastroduodenoscopy  06/10/2011    Procedure: ESOPHAGOGASTRODUODENOSCOPY (EGD);  Surgeon: Ladene Artist, MD,FACG;  Location: [facility] ENDOSCOPY;  Service: Endoscopy;  Laterality: N/A;  . Coronary artery bypass graft  2003    CABG X5  . Cardiac catheterization  2003;   . Coronary angioplasty  1998    [name] 07/13/2010  . Cataract extraction, bilateral Bilateral    . Cardiac catheterization N/A 11/07/2014    Procedure: Left Heart Cath and Coronary Angiography;  Surgeon: Belva Crome, MD;  Location: [place] CV LAB;  Service: Cardiovascular;  Laterality: N/A;  . Colonoscopy     Family History  Problem Relation Age of Onset  . Diabetes      brothers and sisters  . Hypertension Sister   . Coronary artery disease    . Stroke Mother   . Hypertension Mother   . Cancer Mother   . Heart attack Sister    Social History  Substance Use Topics  . Smoking status: Former Smoker -- 0.50 packs/day for 20 years    Types: Cigarettes  . Smokeless tobacco: Never Used     Comment: "quit smoking cigarettes in the 1970's  . Alcohol Use: No    Review of Systems  Constitutional: Negative for fever and chills.  Respiratory: Negative for shortness of breath.   Cardiovascular: Negative for chest pain, palpitations and leg swelling.  Gastrointestinal: Positive for nausea and blood in stool. Negative for abdominal pain and constipation.  Musculoskeletal: Negative for myalgias, back pain, neck pain and neck stiffness.  Skin: Negative for rash and wound.  Neurological: Negative for dizziness, syncope, weakness, light-headedness, numbness and headaches.  All other systems reviewed and are negative.     Allergies  Review of patient's allergies indicates no known allergies.  Home Medications   Prior to Admission medications   Medication Sig Start Date End Date Taking? Authorizing Provider  acetaminophen (TYLENOL) 500 MG tablet Take 1,000 mg by mouth every 6 (six) hours as needed for moderate pain.   Yes Historical Provider, MD  amLODipine (NORVASC) 10 MG tablet TAKE 1 TABLET DAILY 02/18/15  Yes Marletta Lor, MD  aspirin EC 81 MG tablet Take 81 mg by mouth at bedtime.   Yes Historical Provider, MD  atorvastatin (LIPITOR) 80 MG tablet Take 1 tablet (80 mg total) by mouth daily at 6 PM. 11/12/14  Yes Almyra Deforest, PA  carvedilol (COREG) 25 MG tablet Take 1  tablet (25 mg total) by mouth 2 (two) times daily with a meal. 01/01/15  Yes Almyra Deforest, PA  cloNIDine (CATAPRES) 0.1 MG tablet TAKE 1 TABLET AT BEDTIME 04/06/15  Yes Marletta Lor, MD  clopidogrel (PLAVIX) 75 MG tablet Take 1 tablet (75 mg total) by mouth daily. 11/12/14  Yes Almyra Deforest, PA  furosemide (LASIX) 40 MG tablet Take one-half tablet by mouth in the AM and one tablet by mouth in the PM Patient taking differently: Take 40-60 mg by mouth 2 (two) times daily. Take 1 & 1/2 tablet  by mouth in the AM and 1 tablet by mouth in the PM 04/02/15  Yes Belva Crome, MD  isosorbide mononitrate (IMDUR) 120 MG 24 hr tablet Take 1 tablet (120 mg total) by mouth daily. 03/24/15  Yes Belva Crome, MD  LANTUS SOLOSTAR 100 UNIT/ML Solostar Pen INJECT 14 UNITS INTO THE SKIN DAILY AT 10 PM. Patient taking differently: INJECT 20 UNITS INTO THE SKIN DAILY AT 10 PM 04/02/15  Yes Marletta Lor, MD  nitroGLYCERIN (NITROSTAT) 0.4 MG SL tablet Place 0.4 mg under the tongue every 5 (five) minutes as needed for chest pain (3 doses MAX).   Yes Historical Provider, MD  NOVOLOG FLEXPEN 100 UNIT/ML FlexPen Inject 14 Units into the skin 3 (three) times daily. 03/03/15  Yes Historical Provider, MD  pentoxifylline (TRENTAL) 400 MG CR tablet TAKE 1 TABLET TWICE DAILY  AT  10AM  AND  5PM 04/02/15  Yes Marletta Lor, MD  Potassium Chloride ER 20 MEQ TBCR Take 20 mEq by mouth daily. 04/27/15  Yes Marletta Lor, MD  timolol (TIMOPTIC) 0.5 % ophthalmic solution Place 1 drop into both eyes every morning. 04/27/15  Yes Historical Provider, MD  TRAVATAN Z 0.004 % SOLN ophthalmic solution Place 1 drop into both eyes every evening. 04/27/15  Yes Historical Provider, MD   BP 132/61 mmHg  Pulse 68  Temp(Src) 98.4 F (36.9 C) (Oral)  Resp 14  Ht 5\' 11"  (1.803 m)  Wt 166 lb 7.2 oz (75.5 kg)  BMI 23.22 kg/m2  SpO2 100% Physical Exam  Constitutional: He is oriented to person, place, and time. He appears well-developed and  well-nourished. No distress.  HENT:  Head: Normocephalic and atraumatic.  Mouth/Throat: Oropharynx is clear and moist. No oropharyngeal exudate.  Eyes: EOM are normal. Pupils are equal, round, and reactive to light.  Neck: Normal range of motion. Neck supple.  Cardiovascular: Normal rate and regular rhythm.   Pulmonary/Chest: Effort normal and breath sounds normal. No respiratory distress. He has no wheezes. He has no rales. He exhibits no tenderness.  Abdominal: Soft. Bowel sounds are normal. He exhibits no distension and no mass. There is no tenderness. There is no rebound and no guarding.  Genitourinary: Guaiac positive stool.  Gross red blood on digital exam. No enlarged hemorrhoids or fissures appreciated. No tenderness or masses on exam  Musculoskeletal: Normal range of motion.  He exhibits no edema or tenderness.  Neurological: He is alert and oriented to person, place, and time.  Mild confusion. 5/5 motor in all extremities. Sensation is fully intact.  Skin: Skin is warm. No rash noted. He is diaphoretic. No erythema.  Psychiatric: He has a normal mood and affect. His behavior is normal.  Nursing note and vitals reviewed.   ED Course  Procedures (including critical care time) Labs Review Labs Reviewed  COMPREHENSIVE METABOLIC PANEL - Abnormal; Notable for the following:    CO2 19 (*)    Glucose, Bld 136 (*)    BUN 69 (*)    Creatinine, Ser 4.40 (*)    ALT 12 (*)    GFR calc non Af Amer 11 (*)    GFR calc Af Amer 13 (*)    All other components within normal limits  CBC - Abnormal; Notable for the following:    RBC 3.34 (*)    Hemoglobin 8.8 (*)    HCT 27.9 (*)    RDW 17.1 (*)    All other components within normal limits  PROTIME-INR - Abnormal; Notable for the following:    Prothrombin Time 15.7 (*)    All other components within normal limits  APTT - Abnormal; Notable for the following:    aPTT 23 (*)    All other components within normal limits  CBC - Abnormal;  Notable for the following:    RBC 3.22 (*)    Hemoglobin 8.5 (*)    HCT 27.0 (*)    RDW 16.6 (*)    All other components within normal limits  CBC - Abnormal; Notable for the following:    RBC 3.18 (*)    Hemoglobin 8.4 (*)    HCT 26.6 (*)    RDW 16.4 (*)    All other components within normal limits  BASIC METABOLIC PANEL - Abnormal; Notable for the following:    Chloride 113 (*)    CO2 21 (*)    Glucose, Bld 121 (*)    BUN 57 (*)    Creatinine, Ser 3.38 (*)    GFR calc non Af Amer 16 (*)    GFR calc Af Amer 18 (*)    All other components within normal limits  CBC - Abnormal; Notable for the following:    RBC 3.20 (*)    Hemoglobin 8.6 (*)    HCT 27.0 (*)    RDW 16.8 (*)    All other components within normal limits  GLUCOSE, CAPILLARY - Abnormal; Notable for the following:    Glucose-Capillary 192 (*)    All other components within normal limits  GLUCOSE, CAPILLARY - Abnormal; Notable for the following:    Glucose-Capillary 190 (*)    All other components within normal limits  GLUCOSE, CAPILLARY - Abnormal; Notable for the following:    Glucose-Capillary 214 (*)    All other components within normal limits  POC OCCULT BLOOD, ED - Abnormal; Notable for the following:    Fecal Occult Bld POSITIVE (*)    All other components within normal limits  MRSA PCR SCREENING  CBC  TYPE AND SCREEN  PREPARE RBC (CROSSMATCH)    Imaging Review No results found. I have personally reviewed and evaluated these images and lab results as part of my medical decision-making.   EKG Interpretation None      MDM   Final diagnoses:  Lower GI bleed  Renal insufficiency    Type and screen for possible transfusion. 2 large  bore IVs are established. Patient's GI bleed likely complicated by taking several blood pressure medications this morning. We'll give IV bolus of normal saline and monitor closely.  Patient's blood pressure and heart rate have improved. Possible vagal episode.  Rectal exam with gross red blood. No obvious source.  Discussed with [facility] hospitalist. Will admit to step down bed. Also consult GI.     Julianne Rice, MD 05/11/15 [phone]

## 2015-05-10 NOTE — ED Notes (Signed)
Pt c/o blood in stools onset yesterday. Pt denies abdominal pain or N/V.

## 2015-05-10 NOTE — Consult Note (Signed)
Consultation  Referring Provider:     [facility] Primary Care Physician:  [name], MD Primary Gastroenterologist:        Prior Deatra Ina patient Reason for Consultation:     Hematochezia/GI bleed     Impression / Plan:   Hematochezia - suspected acute lower GI bleed. Clinical scenario suggests diverticular hemorrhage Acute bllood loss anemia w/ hgb 11-12 down to 8.8 On clopidogrel and ASA at admit (held) - CAD hx CABG and occluded graft last cath 2016 Hx 4 mm unretrieved colon polyp, melanosis, and diverticulosis last colonoscopy 2010 2015 EGD 6 mm superficial benign gastric ulcer    Agree w/ SDU care (bleeding, age, co-morbidities) Transfuse 1 U RBC sensible Hold clopidogrel and asa  Observe+ support - if he stops consider elective colonoscopy by one of my partners taking over Dr. Deatra Ina patients - if he does not will decided tagged RBC vs colonoscopy Could transfuse PLT's to help hemostasis if needed          HPI:   LEOTIS BRACEY is a 82 y.o. male with PMH as above, also CHF, CKD and DM - started having painless hematochezia yesterday. Multiple episodes since and admitted through ED. No melena, no vomiting, upper abd pain or sxs. He was transiently mildly hypotensive in ED.  Past Medical History  Diagnosis Date  . CAD (coronary artery disease)     a. s/p CABG 2003. b. NSTEMI 10/2014 - cath with bypass graft failure; vessel recanalized on IV heparin, treated medically, reserving PCI for recurrent angina.  . Hyperlipidemia   . Essential hypertension   . Myocardial infarction ([place]) 1998  . Type II diabetes mellitus ([place])   . History of stomach ulcers   . Arthritis     "touch in my fingers" (09/10/2014)  . B12 deficiency anemia     "stopped taking the shots in ~ 06/2014" (09/10/2014)  . Diabetes 1.5, managed as type 1 ([place])   . CKD (chronic kidney disease), stage IV ([place])   . PUD (peptic ulcer disease) 2013  . Chronic systolic CHF (congestive  heart failure) (HCC)     a. EF 40-45% in 10/2014 (previously normal in 08/2014).  . Ischemic cardiomyopathy   . PVD (peripheral vascular disease) [facility])     Past Surgical History  Procedure Laterality Date  . Esophagogastroduodenoscopy  06/10/2011    Procedure: ESOPHAGOGASTRODUODENOSCOPY (EGD);  Surgeon: Ladene Artist, MD,FACG;  Location: [facility] ENDOSCOPY;  Service: Endoscopy;  Laterality: N/A;  . Coronary artery bypass graft  2003    CABG X5  . Cardiac catheterization  2003;   . Coronary angioplasty  1998    [name] 07/13/2010  . Cataract extraction, bilateral Bilateral   . Cardiac catheterization N/A 11/07/2014    Procedure: Left Heart Cath and Coronary Angiography;  Surgeon: Belva Crome, MD;  Location: [place] CV LAB;  Service: Cardiovascular;  Laterality: N/A;    Family History  Problem Relation Age of Onset  . Diabetes      brothers and sisters  . Hypertension Sister   . Coronary artery disease    . Stroke Mother   . Hypertension Mother   . Cancer Mother   . Heart attack Sister     Social History  Substance Use Topics  . Smoking status: Former Smoker -- 0.50 packs/day for 20 years    Types: Cigarettes  . Smokeless tobacco: Never Used     Comment: "quit smoking cigarettes in the 1970's  . Alcohol  Use: No    Prior to Admission medications   Medication Sig Start Date End Date Taking? Authorizing Provider  acetaminophen (TYLENOL) 500 MG tablet Take 1,000 mg by mouth every 6 (six) hours as needed for moderate pain.   Yes Historical Provider, MD  amLODipine (NORVASC) 10 MG tablet TAKE 1 TABLET DAILY 02/18/15  Yes Marletta Lor, MD  aspirin EC 81 MG tablet Take 81 mg by mouth at bedtime.   Yes Historical Provider, MD  atorvastatin (LIPITOR) 80 MG tablet Take 1 tablet (80 mg total) by mouth daily at 6 PM. 11/12/14  Yes Almyra Deforest, PA  carvedilol (COREG) 25 MG tablet Take 1 tablet (25 mg total) by mouth 2 (two) times daily with a meal. 01/01/15  Yes Almyra Deforest, PA  cloNIDine  (CATAPRES) 0.1 MG tablet TAKE 1 TABLET AT BEDTIME 04/06/15  Yes Marletta Lor, MD  clopidogrel (PLAVIX) 75 MG tablet Take 1 tablet (75 mg total) by mouth daily. 11/12/14  Yes Almyra Deforest, PA  furosemide (LASIX) 40 MG tablet Take one-half tablet by mouth in the AM and one tablet by mouth in the PM Patient taking differently: Take 40-60 mg by mouth 2 (two) times daily. Take 1 & 1/2 tablet  by mouth in the AM and 1 tablet by mouth in the PM 04/02/15  Yes Belva Crome, MD  isosorbide mononitrate (IMDUR) 120 MG 24 hr tablet Take 1 tablet (120 mg total) by mouth daily. 03/24/15  Yes Belva Crome, MD  LANTUS SOLOSTAR 100 UNIT/ML Solostar Pen INJECT 14 UNITS INTO THE SKIN DAILY AT 10 PM. Patient taking differently: INJECT 20 UNITS INTO THE SKIN DAILY AT 10 PM 04/02/15  Yes Marletta Lor, MD  nitroGLYCERIN (NITROSTAT) 0.4 MG SL tablet Place 0.4 mg under the tongue every 5 (five) minutes as needed for chest pain (3 doses MAX).   Yes Historical Provider, MD  NOVOLOG FLEXPEN 100 UNIT/ML FlexPen Inject 14 Units into the skin 3 (three) times daily. 03/03/15  Yes Historical Provider, MD  pentoxifylline (TRENTAL) 400 MG CR tablet TAKE 1 TABLET TWICE DAILY  AT  10AM  AND  5PM 04/02/15  Yes Marletta Lor, MD  Potassium Chloride ER 20 MEQ TBCR Take 20 mEq by mouth daily. 04/27/15  Yes Marletta Lor, MD  timolol (TIMOPTIC) 0.5 % ophthalmic solution Place 1 drop into both eyes every morning. 04/27/15  Yes Historical Provider, MD  TRAVATAN Z 0.004 % SOLN ophthalmic solution Place 1 drop into both eyes every evening. 04/27/15  Yes Historical Provider, MD    Current Facility-Administered Medications  Medication Dose Route Frequency Provider Last Rate Last Dose  . 0.9 %  sodium chloride infusion   Intravenous Continuous Willia Craze, NP 75 mL/hr at 05/10/15 1632    . acetaminophen (TYLENOL) tablet 650 mg  650 mg Oral Q6H PRN Willia Craze, NP       Or  . acetaminophen (TYLENOL) suppository 650 mg  650 mg  Rectal Q6H PRN Willia Craze, NP      . acetaminophen (TYLENOL) tablet 1,000 mg  1,000 mg Oral Q6H PRN Willia Craze, NP      . aspirin EC tablet 81 mg  81 mg Oral QHS Willia Craze, NP      . atorvastatin (LIPITOR) tablet 80 mg  80 mg Oral q1800 Willia Craze, NP      . carvedilol (COREG) tablet 25 mg  25 mg Oral BID WC Willia Craze,  NP   25 mg at 05/10/15 1630  . HYDROcodone-acetaminophen (NORCO/VICODIN) 5-325 MG per tablet 1-2 tablet  1-2 tablet Oral Q4H PRN Willia Craze, NP      . insulin aspart (novoLOG) injection 0-9 Units  0-9 Units Subcutaneous TID WC Willia Craze, NP      . latanoprost (XALATAN) 0.005 % ophthalmic solution 1 drop  1 drop Both Eyes QHS Willia Craze, NP      . ondansetron [facility]) injection 4 mg  4 mg Intravenous Once Julianne Rice, MD   Stopped at 05/10/15 1203  . ondansetron (ZOFRAN) tablet 4 mg  4 mg Oral Q6H PRN Willia Craze, NP       Or  . ondansetron [facility]) injection 4 mg  4 mg Intravenous Q6H PRN Willia Craze, NP      . pantoprazole (PROTONIX) injection 40 mg  40 mg Intravenous Q12H Willia Craze, NP   40 mg at 05/10/15 1316  . pentoxifylline (TRENTAL) CR tablet 400 mg  400 mg Oral BID Willia Craze, NP   400 mg at 05/10/15 1630  . sodium chloride flush (NS) 0.9 % injection 3 mL  3 mL Intravenous Q12H Willia Craze, NP      . Derrill Memo ON 05/11/2015] timolol (TIMOPTIC) 0.5 % ophthalmic solution 1 drop  1 drop Both Eyes q morning - [place], NP        Allergies as of 05/10/2015  . (No Known Allergies)     Review of Systems:    This is positive for those things mentioned in the HPI, denies CP, dyspnea, dizziness All other review of systems are negative.       Physical Exam:  Vital signs in last 24 hours: Temp:  [97.3 F (36.3 C)-97.7 F (36.5 C)] 97.3 F (36.3 C) (03/12 1537) Pulse Rate:  [55-68] 68 (03/12 1537) Resp:  [12-25] 12 (03/12 1537) BP: (97-130)/(53-67) 123/58 mmHg (03/12 1537) SpO2:   [97 %-100 %] 100 % (03/12 1537) Weight:  [167 lb 8.8 oz (76 kg)-170 lb 6 oz (77.282 kg)] 167 lb 8.8 oz (76 kg) (03/12 1454)    General:  Well-developed, well-nourished and in no acute distress Eyes:  anicteric. Lungs: Clear to auscultation bilaterally. Heart:  S1S2, no rubs, murmurs, gallops. Abdomen:  soft, non-tender, no hepatosplenomegaly, hernia, or mass and BS+. No bruit Extremities:   no edema Neuro:  A&O x 3.  Psych:  appropriate mood and  Affect.   Data Reviewed:   LAB RESULTS:  Recent Labs  05/10/15 1032  WBC 8.7  HGB 8.8*  HCT 27.9*  PLT 219   BMET  Recent Labs  05/10/15 1032  NA 138  K 4.4  CL 105  CO2 19*  GLUCOSE 136*  BUN 69*  CREATININE 4.40*  CALCIUM 9.1   LFT  Recent Labs  05/10/15 1032  PROT 6.8  ALBUMIN 3.7  AST 16  ALT 12*  ALKPHOS 63  BILITOT 0.5   PT/INR  Recent Labs  05/10/15 1135  LABPROT 15.7*  INR 1.23     PREVIOUS ENDOSCOPIES:            See HPI   Thanks   LOS: 0 days   @Carl  Simonne Maffucci, MD, [facility] @  05/10/2015, 4:58 PM

## 2015-05-10 NOTE — ED Notes (Signed)
Paged Tye Savoy, MD to [facility], [place].

## 2015-05-10 NOTE — ED Notes (Signed)
Called to triage x 1 

## 2015-05-10 NOTE — ED Notes (Signed)
C/o rectal bleeding onset yest states it started yest. Had 2 episodes rectal bleeding yest . Denies pain. States bleeding was pretty heavey bleeding c/o weakness and near syncope yest.

## 2015-05-10 NOTE — H&P (Signed)
[facility] History and Physical  [name] [id number] DOB: 14-Apr-1931 DOA: 05/10/2015  Referring physician: Emergency Department PCP: Brandon Cowden, MD   CHIEF COMPLAINT:   Bloody stools                HPI: [name] is a 82 y.o. male with multiple medical problems not limited to stage IV chronic kidney disease, diabetes, gastric ulcer, HTN and CAD s/p remote CABG, chronic combined systolic and diastolic heart failure. Admitted October 2016 with NSTEMI / heart failure.  Cath revealed occllded SVG to OM1/OM2 which recanalized with IV heparin. No PCI was done.    Patient came to emergency department today with complaints of bloody stool for last 2 days. No associated abdominal pain. He had nausea without vomiting this morning. No melena. Patient takes a daily aspirin, no other NSAIDs. He is on Plavix.     ED COURSE:   Had episode of hypotension / bradycardia / weakness in Triage, improved with 560ml bolus.          Labs:   BUN 69 / Cr 4.4 (baseline 3.0) Hemoglobin 8.8 (mid 10 to mid 11)   Medications  ondansetron (ZOFRAN) injection 4 mg (0 mg Intravenous Hold 05/10/15 1203)  sodium chloride 0.9 % bolus 500 mL (500 mLs Intravenous New Bag/Given 05/10/15 1107)    Review of Systems  HENT: Negative.   Eyes: Negative.   Respiratory: Negative.   Cardiovascular: Negative.   Gastrointestinal: Positive for blood in stool.  Genitourinary: Negative.   Musculoskeletal: Negative.   Skin: Negative.   Neurological: Positive for weakness.  Endo/Heme/Allergies: Negative.   Psychiatric/Behavioral: Negative.     Past Medical History  Diagnosis Date  . CAD (coronary artery disease)     a. s/p CABG 2003. b. NSTEMI 10/2014 - cath with bypass graft failure; vessel recanalized on IV heparin, treated medically, reserving PCI for recurrent angina.  . Hyperlipidemia   . Essential hypertension   . Myocardial infarction ([name]) 1998  . Type II diabetes mellitus  ([place])   . History of stomach ulcers   . Arthritis     "touch in my fingers" (09/10/2014)  . B12 deficiency anemia     "stopped taking the shots in ~ 06/2014" (09/10/2014)  . Diabetes 1.5, managed as type 1 ([name])   . CKD (chronic kidney disease), stage IV ([name])   . PUD (peptic ulcer disease) 2013  . Chronic systolic CHF (congestive heart failure) ([facility])     a. EF 40-45% in 10/2014 (previously normal in 08/2014).  . Ischemic cardiomyopathy   . PVD (peripheral vascular disease) [facility]    Past Surgical History  Procedure Laterality Date  . Esophagogastroduodenoscopy  06/10/2011    Procedure: ESOPHAGOGASTRODUODENOSCOPY (EGD);  Surgeon: Ladene Artist, MD,FACG;  Location: [name];  Service: Endoscopy;  Laterality: N/A;  . Coronary artery bypass graft  2003    CABG X5  . Cardiac catheterization  2003;   . Coronary angioplasty  1998    [name] 07/13/2010  . Cataract extraction, bilateral Bilateral   . Cardiac catheterization N/A 11/07/2014    Procedure: Left Heart Cath and Coronary Angiography;  Surgeon: Belva Crome, MD;  Location: [name];  Service: Cardiovascular;  Laterality: N/A;    SOCIAL HISTORY:  reports that he has quit smoking. His smoking use included Cigarettes. He has a 10 pack-year smoking history. He has never used smokeless tobacco. He reports that he does not drink alcohol or use illicit drugs. Lives: alone  at home    Assistive devices:   Sometimes uses a cane for ambulation.   No Known Allergies  Family History  Problem Relation Age of Onset  . Diabetes      brothers and sisters  . Hypertension Sister   . Coronary artery disease    . Stroke Mother   . Hypertension Mother   . Cancer Mother   . Heart attack Sister      Prior to Admission medications   Medication Sig Start Date End Date Taking? Authorizing Provider  acetaminophen (TYLENOL) 500 MG tablet Take 500 mg by mouth daily as needed for mild pain or headache.    Historical Provider, MD  amLODipine  (NORVASC) 10 MG tablet TAKE 1 TABLET DAILY 02/18/15   Marletta Lor, MD  aspirin EC 81 MG tablet Take 81 mg by mouth daily.    Historical Provider, MD  atorvastatin (LIPITOR) 80 MG tablet Take 1 tablet (80 mg total) by mouth daily at 6 PM. 11/12/14   Almyra Deforest, PA  carvedilol (COREG) 25 MG tablet Take 1 tablet (25 mg total) by mouth 2 (two) times daily with a meal. 01/01/15   Almyra Deforest, PA  cloNIDine (CATAPRES) 0.1 MG tablet TAKE 1 TABLET AT BEDTIME 04/06/15   Marletta Lor, MD  clopidogrel (PLAVIX) 75 MG tablet Take 1 tablet (75 mg total) by mouth daily. 11/12/14   Almyra Deforest, PA  furosemide (LASIX) 40 MG tablet Take one-half tablet by mouth in the AM and one tablet by mouth in the PM Patient taking differently: Take 40 mg by mouth. Take 1 & 1/2 tablet  by mouth in the AM and 1 tablet by mouth in the PM 04/02/15   Belva Crome, MD  isosorbide mononitrate (IMDUR) 120 MG 24 hr tablet Take 1 tablet (120 mg total) by mouth daily. 03/24/15   Belva Crome, MD  LANTUS SOLOSTAR 100 UNIT/ML Solostar Pen INJECT 14 UNITS INTO THE SKIN DAILY AT 10 PM. 04/02/15   Marletta Lor, MD  latanoprost (XALATAN) 0.005 % ophthalmic solution Place 1 drop into both eyes at bedtime.  08/23/14   Historical Provider, MD  Melatonin 5 MG TABS Take 5 mg by mouth at bedtime as needed (use for sleep).     Historical Provider, MD  nitroGLYCERIN (NITROSTAT) 0.4 MG SL tablet Place 0.4 mg under the tongue every 5 (five) minutes as needed for chest pain (3 doses MAX).    Historical Provider, MD  NOVOLOG FLEXPEN 100 UNIT/ML FlexPen Inject 14 Units into the skin 3 (three) times daily. 03/03/15   Historical Provider, MD  pentoxifylline (TRENTAL) 400 MG CR tablet TAKE 1 TABLET TWICE DAILY  AT  10AM  AND  5PM 04/02/15   Marletta Lor, MD  Potassium Chloride ER 20 MEQ TBCR Take 20 mEq by mouth daily. 04/27/15   Marletta Lor, MD  timolol (TIMOPTIC) 0.25 % ophthalmic solution Place 1 drop into both eyes daily.  08/23/14   Historical  Provider, MD   PHYSICAL EXAM: Filed Vitals:   05/10/15 1028 05/10/15 1109 05/10/15 1201  BP: 97/53 111/54 122/61  Pulse: 55 56 65  Temp: 97.6 F (36.4 C)    TempSrc: Oral    Resp: 18 16 18   Height: 5\' 11"  (1.803 m)    Weight: 77.282 kg (170 lb 6 oz)    SpO2: 99% 99% 99%    Wt Readings from Last 3 Encounters:  05/10/15 77.282 kg (170 lb 6 oz)  04/22/15  73.936 kg (163 lb)  03/25/15 75.297 kg (166 lb)    General:  Pleasant black  male. Appears calm and comfortable Eyes: PER, normal lids, irises & conjunctiva ENT: grossly normal hearing, lips & tongue Neck: no LAD, no masses Cardiovascular: Regular rate, occasional extra beat. Occasional PVC pairs on monitor. No murmurs. No LE edema.  Respiratory: Respirations even and unlabored. Normal respiratory effort. Lungs CTA bilaterally, no wheezes / rales .   Abdomen: soft, non-distended, non-tender, active bowel sounds. No obvious masses.  Skin: no rash seen on limited exam Musculoskeletal: grossly normal tone BUE/BLE Psychiatric: grossly normal mood and affect, speech fluent and appropriate Neurologic: grossly non-focal.         LABS ON ADMISSION:    Basic Metabolic Panel:  Recent Labs Holt 05/10/15 1032  NA 138  K 4.4  CL 105  CO2 19*  GLUCOSE 136*  BUN 69*  CREATININE 4.40*  CALCIUM 9.1   Liver Function Tests:  Recent Labs Holt 05/10/15 1032  AST 16  ALT 12*  ALKPHOS 63  BILITOT 0.5  PROT 6.8  ALBUMIN 3.7    CBC:  Recent Labs Holt 05/10/15 1032  WBC 8.7  HGB 8.8*  HCT 27.9*  MCV 83.5  PLT 219    BNP (last 3 results)  Recent Labs  12/29/14 1140  BNP 1114.2*   CREATININE: 4.4 mg/dL ABNORMAL (05/10/15 1032) Estimated creatinine clearance - 14 mL/min   Echocardiogram September 2016  Study Conclusions  - Left ventricle: The cavity size was normal. Wall thickness was  normal. Systolic function was mildly to moderately reduced. The  estimated ejection fraction was in the range of 40% to  45%.  Akinesis of the inferior myocardium. - Mitral valve: There was mild regurgitation. - Pulmonary arteries: Systolic pressure was moderately increased.  PA peak pressure: 46 mm Hg (S).  Transthoracic echocardiography. M-mode, complete 2D, spectral Doppler, and color Doppler. Birthdate: Patient birthdate: 1931/11/30. Age: Patient is 82 yr old. Sex: Gender: male. BMI: 24 kg/m^2. Blood pressure: 140/76 Patient status: Inpatient. Study date: Study date: 11/07/2014. Study time: 10:12    EGD April 2013 for anemia and Hemoccult-positive stools. A small antral ulcer was found Colonoscopy 2010 for history of adenomatous polyps. Findings: distal transverse and descending colon colitis, moderate diverticulosis. Biopsies = nonspecific active colitis  ASSESSMENT / PLAN   Gastrointestinal bleeding / painless hematochezia on plavix and asa. He has associated mild hypotension.  - Suspect diverticular bleed. Though given hypotension, brisk upper bleed not totally excluded. -admit to stepdown -transfuse one unit ot PRBC for symptomatic anemia -hold plavix -serial CBCs - BID ppi.  -Gastroenterology has been called by ED. Keep patient NPO until seen by GI.  -Gentle (hx of heart failure) IV hydration while NPO  Diabetes mellitus with renal complications ([place]). A1c 7.7 mid December 2016  -CBGs and SSI  Hypertension. BP on low side today.  -hold home BP (Norvasc, Catapres, Imdur and lasix) for now.  Acute renal failure superimposed on CKD stage IV  -hold home lasix -avoid nephrotoxic medications -am BMET  CAD (coronary artery disease) of bypass graft. Remote CABG. NSTEMI October 2016 revealing occluded graft. Managed medically.  -hold home plavix.   Dyslipidemia -continue home statin  Chronic combined systolic and diastolic heart failure. EF 40-45% -continue coreg -daily weights -I and O  CONSULTANTS:   [facility] called by EDP   Code Status: Full  code DVT Prophylaxis: SCD Family Communication:  Patient alert, oriented and understands plan of care.  Disposition  Plan: Discharge to home in 2-3 days   Time spent: 60 minutes Tye Savoy  NP [facility] Pager [phone]

## 2015-05-11 ENCOUNTER — Inpatient Hospital Stay (HOSPITAL_COMMUNITY): Payer: Commercial Managed Care - HMO

## 2015-05-11 ENCOUNTER — Encounter (HOSPITAL_COMMUNITY): Payer: Self-pay | Admitting: Physician Assistant

## 2015-05-11 ENCOUNTER — Ambulatory Visit: Payer: Commercial Managed Care - HMO | Admitting: Internal Medicine

## 2015-05-11 LAB — GLUCOSE, CAPILLARY
GLUCOSE-CAPILLARY: 214 mg/dL — AB (ref 65–99)
GLUCOSE-CAPILLARY: 121 mg/dL — AB (ref 65–99)
Glucose-Capillary: 155 mg/dL — ABNORMAL HIGH (ref 65–99)
Glucose-Capillary: 113 mg/dL — ABNORMAL HIGH (ref 65–99)

## 2015-05-11 LAB — CBC
HCT: 26.6 % — ABNORMAL LOW (ref 39.0–52.0)
HCT: 27 % — ABNORMAL LOW (ref 39.0–52.0)
HEMATOCRIT: 25 % — AB (ref 39.0–52.0)
HEMOGLOBIN: 8.6 g/dL — AB (ref 13.0–17.0)
HEMOGLOBIN: 7.9 g/dL — AB (ref 13.0–17.0)
Hemoglobin: 8.4 g/dL — ABNORMAL LOW (ref 13.0–17.0)
MCH: 26.4 pg (ref 26.0–34.0)
MCH: 26.5 pg (ref 26.0–34.0)
MCH: 26.9 pg (ref 26.0–34.0)
MCHC: 31.6 g/dL (ref 30.0–36.0)
MCHC: 31.6 g/dL (ref 30.0–36.0)
MCHC: 31.9 g/dL (ref 30.0–36.0)
MCV: 83.6 fL (ref 78.0–100.0)
MCV: 83.9 fL (ref 78.0–100.0)
MCV: 84.4 fL (ref 78.0–100.0)
PLATELETS: 183 10*3/uL (ref 150–400)
PLATELETS: 193 10*3/uL (ref 150–400)
Platelets: 202 10*3/uL (ref 150–400)
RBC: 2.98 MIL/uL — AB (ref 4.22–5.81)
RBC: 3.18 MIL/uL — ABNORMAL LOW (ref 4.22–5.81)
RBC: 3.2 MIL/uL — AB (ref 4.22–5.81)
RDW: 16.4 % — ABNORMAL HIGH (ref 11.5–15.5)
RDW: 16.8 % — ABNORMAL HIGH (ref 11.5–15.5)
RDW: 16.8 % — ABNORMAL HIGH (ref 11.5–15.5)
WBC: 6.6 10*3/uL (ref 4.0–10.5)
WBC: 5.9 10*3/uL (ref 4.0–10.5)
WBC: 6.2 10*3/uL (ref 4.0–10.5)

## 2015-05-11 LAB — BASIC METABOLIC PANEL
ANION GAP: 8 (ref 5–15)
BUN: 57 mg/dL — ABNORMAL HIGH (ref 6–20)
CALCIUM: 8.9 mg/dL (ref 8.9–10.3)
CO2: 21 mmol/L — ABNORMAL LOW (ref 22–32)
CREATININE: 3.38 mg/dL — AB (ref 0.61–1.24)
Chloride: 113 mmol/L — ABNORMAL HIGH (ref 101–111)
GFR calc non Af Amer: 16 mL/min — ABNORMAL LOW (ref >60)
GFR, EST AFRICAN AMERICAN: 18 mL/min — AB (ref >60)
Glucose, Bld: 121 mg/dL — ABNORMAL HIGH (ref 65–99)
Potassium: 4.9 mmol/L (ref 3.5–5.1)
SODIUM: 142 mmol/L (ref 135–145)

## 2015-05-11 MED ORDER — CLONIDINE HCL 0.1 MG PO TABS
0.1000 mg | ORAL_TABLET | Freq: Every day | ORAL | Status: DC
  Administered 2015-05-11 – 2015-05-13 (×3): 0.1 mg via ORAL
  Filled 2015-05-11 (×3): qty 1

## 2015-05-11 MED ORDER — INSULIN ASPART 100 UNIT/ML ~~LOC~~ SOLN: 0.0000 [IU] | Freq: Every day | SUBCUTANEOUS | Status: DC

## 2015-05-11 MED ORDER — ISOSORBIDE MONONITRATE ER 60 MG PO TB24
60.0000 mg | ORAL_TABLET | Freq: Every day | ORAL | Status: DC
  Administered 2015-05-11 – 2015-05-14 (×4): 60 mg via ORAL
  Filled 2015-05-11: qty 2
  Filled 2015-05-11 (×2): qty 1
  Filled 2015-05-11: qty 2

## 2015-05-11 MED ORDER — METOCLOPRAMIDE HCL 5 MG/ML IJ SOLN: 10.0000 mg | Freq: Four times a day (QID) | INTRAMUSCULAR | Status: AC | PRN

## 2015-05-11 MED ORDER — PEG-KCL-NACL-NASULF-NA ASC-C 100 G PO SOLR
0.5000 | Freq: Once | ORAL | Status: AC
Start: 2015-05-12 — End: 2015-05-12
  Administered 2015-05-12: 100 g via ORAL
  Filled 2015-05-11: qty 1

## 2015-05-11 MED ORDER — PEG-KCL-NACL-NASULF-NA ASC-C 100 G PO SOLR: 0.5000 | Freq: Once | ORAL | Status: DC

## 2015-05-11 MED ORDER — TECHNETIUM TC 99M-LABELED RED BLOOD CELLS IV KIT
25.3000 | PACK | Freq: Once | INTRAVENOUS | Status: AC | PRN
Start: 2015-05-11 — End: 2015-05-11
  Administered 2015-05-11: 25 via INTRAVENOUS

## 2015-05-11 MED ORDER — PEG-KCL-NACL-NASULF-NA ASC-C 100 G PO SOLR
0.5000 | Freq: Once | ORAL | Status: DC
  Filled 2015-05-11: qty 1

## 2015-05-11 MED ORDER — PEG-KCL-NACL-NASULF-NA ASC-C 100 G PO SOLR
0.5000 | Freq: Once | ORAL | Status: AC
  Administered 2015-05-11: 100 g via ORAL
  Filled 2015-05-11: qty 1

## 2015-05-11 MED ORDER — INSULIN ASPART 100 UNIT/ML ~~LOC~~ SOLN
0.0000 [IU] | Freq: Three times a day (TID) | SUBCUTANEOUS | Status: DC
  Administered 2015-05-11: 2 [IU] via SUBCUTANEOUS
  Administered 2015-05-12 (×2): 3 [IU] via SUBCUTANEOUS
  Administered 2015-05-13: 2 [IU] via SUBCUTANEOUS
  Administered 2015-05-13: 3 [IU] via SUBCUTANEOUS
  Administered 2015-05-13 – 2015-05-14 (×3): 2 [IU] via SUBCUTANEOUS

## 2015-05-11 MED ORDER — PEG-KCL-NACL-NASULF-NA ASC-C 100 G PO SOLR: 1.0000 | Freq: Once | ORAL | Status: DC

## 2015-05-11 MED ORDER — PANTOPRAZOLE SODIUM 40 MG PO TBEC
40.0000 mg | DELAYED_RELEASE_TABLET | Freq: Every day | ORAL | Status: DC
  Administered 2015-05-11 – 2015-05-12 (×2): 40 mg via ORAL
  Filled 2015-05-11 (×2): qty 1

## 2015-05-11 NOTE — Progress Notes (Signed)
[place] TEAM 1 - Stepdown/ICU TEAM PROGRESS NOTE  BLEU MINERD TIR:[id number] DOB: 1931-09-29 DOA: 05/10/2015 PCP: [name], MD  Admit HPI / Brief Narrative: 82 y.o. male with stage IV chronic kidney disease, diabetes, gastric ulcer, HTN, CAD s/p remote CABG (October 2016 NSTEMI > Cath occlluded SVG to OM1/OM2 recanalized with IV heparin - no PCI), and chronic combined systolic and diastolic heart failure who came to emergency department with complaints of painless bloody stool for 2 days. Patient takes a daily aspirin and Plavix, no other NSAIDs.    HPI/Subjective: The patient has had 2 large bloody bowel movements thus far today.  He denies current shortness of breath chest pain nausea vomiting or abdominal pain.  He is tolerating his clear liquid diet without difficulty.  Assessment/Plan:  Hematochezia - Gastrointestinal bleeding painless hematochezia on plavix and asa - suspect diverticular bleed - nuclear medicine scan suggests bleeding in sigmoid colon region - GI continues to follow and is considering colonoscopy in a.m.  Acute blood loss anemia  Due to above - transfuse as needed to keep hemoglobin 8.0 or greater - follow hemoglobin in serial fashion - s/p 1U PRBC thus far  DM2 with renal complications Q7Y 7.04 February 2015 - CBG climbing - adjust treatment and follow  Acute hypotension w/ hx of Hypertension BP currently well controlled - follow trend - hypotension appears to have resolved w/ volume expansion    Acute renal failure superimposed on CKD stage IV  Baseline crt 2.8-3.5 - crt improving w/ volume expansion - follow trend   CAD s/ remote CABG - NSTEMI October 2016 revealing occluded graft No chest pain - keep hemoglobin 8.0 or greater  Dyslipidemia continue home statin  Chronic combined systolic and diastolic heart failure EF 40-45% - no evidence of volume overload at present  Code Status: FULL Family Communication: Spoke with son  over phone while visiting with patient Disposition Plan: SDU  Consultants:  GI   Procedures: 3/13 - Nuc Med bleeding scan   Antibiotics: none  DVT prophylaxis: SCDs  Objective: Blood pressure 137/60, pulse 65, temperature 98.4 F (36.9 C), temperature source Oral, resp. rate 14, height '5\' 11"'  (1.803 m), weight 75.5 kg (166 lb 7.2 oz), SpO2 98 %.  Intake/Output Summary (Last 24 hours) at 05/11/15 1412 Last data filed at 05/11/15 0900  Gross per 24 hour  Intake   1265 ml  Output      0 ml  Net   1265 ml   Exam: General: No acute respiratory distress Lungs: Clear to auscultation bilaterally without wheezes or crackles Cardiovascular: Regular rate and rhythm without murmur gallop or rub normal S1 and S2 Abdomen: Nontender, nondistended, soft, bowel sounds positive, no rebound, no ascites, no appreciable mass Extremities: No significant cyanosis, clubbing, or edema bilateral lower extremities  Data Reviewed:  Basic Metabolic Panel:  Recent Labs Lab 05/10/15 1032 05/11/15 0702  NA 138 142  K 4.4 4.9  CL 105 113*  CO2 19* 21*  GLUCOSE 136* 121*  BUN 69* 57*  CREATININE 4.40* 3.38*  CALCIUM 9.1 8.9    CBC:  Recent Labs Lab 05/10/15 1032 05/10/15 2222 05/10/15 2350 05/11/15 0702  WBC 8.7 6.8 6.6 5.9  HGB 8.8* 8.5* 8.4* 8.6*  HCT 27.9* 27.0* 26.6* 27.0*  MCV 83.5 83.9 83.6 84.4  PLT 219 190 183 193    Liver Function Tests:  Recent Labs Lab 05/10/15 1032  AST 16  ALT 12*  ALKPHOS 63  BILITOT 0.5  PROT 6.8  ALBUMIN 3.7   Coags:  Recent Labs Lab 05/10/15 1135  INR 1.23    Recent Labs Lab 05/10/15 1135  APTT 23*   CBG:  Recent Labs Lab 05/10/15 1906 05/10/15 2107 05/11/15 0859  GLUCAP 192* 190* 214*    Recent Results (from the past 240 hour(s))  MRSA PCR Screening     Status: None   Collection Time: 05/10/15  4:21 PM  Result Value Ref Range Status   MRSA by PCR NEGATIVE NEGATIVE Final    Comment:        The GeneXpert  MRSA Assay (FDA approved for NASAL specimens only), is one component of a comprehensive MRSA colonization surveillance program. It is not intended to diagnose MRSA infection nor to guide or monitor treatment for MRSA infections.      Studies:   Recent x-ray studies have been reviewed in detail by the Attending Physician  Scheduled Meds:  Scheduled Meds: . aspirin EC  81 mg Oral QHS  . atorvastatin  80 mg Oral q1800  . carvedilol  25 mg Oral BID WC  . insulin aspart  0-9 Units Subcutaneous TID WC  . latanoprost  1 drop Both Eyes QHS  . ondansetron  4 mg Intravenous Once  . pantoprazole  40 mg Oral Q0600  . peg 3350 powder  0.5 kit Oral Once  . [START ON 05/12/2015] peg 3350 powder  0.5 kit Oral Once  . pentoxifylline  400 mg Oral BID  . sodium chloride flush  3 mL Intravenous Q12H  . timolol  1 drop Both Eyes q morning - 10a    Time spent on care of this patient: 35 mins   MCCLUNG,JEFFREY T , MD   [facility] Office  [phone] Pager - Text Page per Shea Evans as per below:  On-Call/Text Page:      [email or web address]      password [email or web address]  If 7PM-7AM, please contact night-coverage [email or web address] Password [email or web address] 05/11/2015, 2:12 PM   LOS: 1 day

## 2015-05-11 NOTE — Progress Notes (Signed)
Nuc bleeding scan Positive for sigmoid bleeding IR (Dr Earleen Newport) aware.  Will consult for possible IR mgt.  However as he is stable and has stage 3 to 4 CKD, is unlikely to undergo angiography Will prep for colonoscopy set for 12:30, with MAC, tomorrow.   Pt agreeable.    Azucena Freed PA-C

## 2015-05-11 NOTE — Progress Notes (Signed)
Daily Rounding Note  05/11/2015, 8:34 AM  LOS: 1 day   SUBJECTIVE:       Large episode of hematochezia this AM.  Feels well, no dizziness, palpitations,  Chest pain.  No n/v.  No abd pain.   OBJECTIVE:         Vital signs in last 24 hours:    Temp:  [97.3 F (36.3 C)-98.9 F (37.2 C)] 98.4 F (36.9 C) (03/13 0427) Pulse Rate:  [55-78] 70 (03/13 0427) Resp:  [12-25] 16 (03/13 0427) BP: (97-131)/(50-67) 121/50 mmHg (03/13 0427) SpO2:  [97 %-100 %] 99 % (03/13 0427) Weight:  [75.5 kg (166 lb 7.2 oz)-77.282 kg (170 lb 6 oz)] 75.5 kg (166 lb 7.2 oz) (03/13 0500) Last BM Date: 05/11/15 Filed Weights   05/10/15 1028 05/10/15 1454 05/11/15 0500  Weight: 77.282 kg (170 lb 6 oz) 76 kg (167 lb 8.8 oz) 75.5 kg (166 lb 7.2 oz)   General: pleasant, alert.  NAD.  Looks well   Heart: RRR Chest: clear bil.  No labored resps or cough Abdomen: soft, active BS.  NT, ND.   Extremities: no CCE.  Neuro/Psych:  Pleasant, cooperative.  Fully alert and oriented.  No gross deficits.   Intake/Output from previous day: 03/12 0701 - 03/13 0700 In: 905 [P.O.:400; I.V.:50; Blood:455] Out: -   Intake/Output this shift:    Lab Results:  Recent Labs  05/10/15 2222 05/10/15 2350 05/11/15 0702  WBC 6.8 6.6 5.9  HGB 8.5* 8.4* 8.6*  HCT 27.0* 26.6* 27.0*  PLT 190 183 193   BMET  Recent Labs  05/10/15 1032 05/11/15 0702  NA 138 142  K 4.4 4.9  CL 105 113*  CO2 19* 21*  GLUCOSE 136* 121*  BUN 69* 57*  CREATININE 4.40* 3.38*  CALCIUM 9.1 8.9   LFT  Recent Labs  05/10/15 1032  PROT 6.8  ALBUMIN 3.7  AST 16  ALT 12*  ALKPHOS 63  BILITOT 0.5   PT/INR  Recent Labs  05/10/15 1135  LABPROT 15.7*  INR 1.23   Hepatitis Panel No results for input(s): HEPBSAG, HCVAB, HEPAIGM, HEPBIGM in the last 72 hours.  Studies/Results: No results found.   Scheduled Meds: . aspirin EC  81 mg Oral QHS  . atorvastatin  80 mg Oral  q1800  . carvedilol  25 mg Oral BID WC  . insulin aspart  0-9 Units Subcutaneous TID WC  . latanoprost  1 drop Both Eyes QHS  . ondansetron  4 mg Intravenous Once  . pantoprazole (PROTONIX) IV  40 mg Intravenous Q12H  . pentoxifylline  400 mg Oral BID  . sodium chloride flush  3 mL Intravenous Q12H  . timolol  1 drop Both Eyes q morning - 10a   Continuous Infusions: . sodium chloride 75 mL/hr at 05/10/15 1632   PRN Meds:.acetaminophen **OR** acetaminophen, acetaminophen, HYDROcodone-acetaminophen, ondansetron **OR** ondansetron (ZOFRAN) IV   ASSESMENT:   *  Hematochezia.  Ongoing.   *  ABL anemia.  MCV normal.  S/p PRBC x 1.    *  Gastric antral ulcer, atrophic gastric mucosa on 05/2011 EGD (for anemia,  FOBT +, n/v).  Path: benign gastric ulcer, no H Pylori.   *  Chronic Plavix (on hold, indication not clearly outlined but have not yet reviewed all notes), ASA  *  IDDM.     PLAN   *  Stop IV PPI.  Oral PPI given hx of stomach ulcers,  though no PPI in use PTA.   *  nuc med scan today.  Continue clears for now    Azucena Freed  05/11/2015, 8:34 AM Pager: [phone]   ________________________________________________________________________  Velora Heckler GI MD note:  I personally examined the patient, reviewed the data and agree with the assessment and plan described above.  Two somewhat bloody BMs this AM however HD stable and Hb is not decreased since yesterday.  Have ordered nuc med bleeding scan.  I spoke with his son on phone about the plan for today.  Given renal insufficiency it will probably be safest to proceed with colonoscopy rather than IR attempt pending outcome of nuc scan (if positive).  If neg bleeding scan, i'll give this another day to stop on its own before prepping for colonsocopy.    Owens Loffler, MD [facility] Pager [phone]

## 2015-05-11 NOTE — Progress Notes (Signed)
Chief Complaint: Patient was seen in consultation today for GI bleed at the request of Dr. Oretha Caprice  Referring Physician(s): Dr. Oretha Caprice  Supervising Physician: Corrie Mckusick  History of Present Illness: [name] is a 82 y.o. male who has been admitted with lower GI bleed. PMHx reviewed, pt on chronic Plavix for CAD. Last dose was yesterday morning. Hx of stage IV CKD Has now had NM RBC scan suggestive of positive bleeding in sigmoid colon. GI has seen pt and notes reviewed. IR is asked to consult for possible angio/embo if necessary. Pt reports had bloody BM this am and another small bloody BM after returning from [place]. He denies dizziness, abd pain, CP. Niece at bedside and son on speaker phone during evaluation.   Past Medical History  Diagnosis Date  . CAD (coronary artery disease)     a. s/p CABG 2003. b. NSTEMI 10/2014 - cath with bypass graft failure; vessel recanalized on IV heparin, treated medically, reserving PCI for recurrent angina.  . Hyperlipidemia   . Essential hypertension   . Myocardial infarction ([place]) 1998  . Type II diabetes mellitus ([place])   . History of stomach ulcers 2013  . Arthritis     "touch in my fingers" (09/10/2014)  . B12 deficiency anemia     "stopped taking the shots in ~ 06/2014" (09/10/2014)  . Diabetes 1.5, managed as type 1 ([place])   . CKD (chronic kidney disease), stage IV ([place])   . Chronic systolic CHF (congestive heart failure) (H[facility])     a. EF 40-45% in 10/2014 (previously normal in 08/2014).  . Ischemic cardiomyopathy   . PVD (peripheral vascular disease) [facility])     Past Surgical History  Procedure Laterality Date  . Esophagogastroduodenoscopy  06/10/2011    Procedure: ESOPHAGOGASTRODUODENOSCOPY (EGD);  Surgeon: Ladene Artist, MD,FACG;  Location: [facility] ENDOSCOPY;  Service: Endoscopy;  Laterality: N/A;  . Coronary artery bypass graft  2003    CABG X5  . Cardiac catheterization  2003;   . Coronary angioplasty  1998   [name] 07/13/2010  . Cataract extraction, bilateral Bilateral   . Cardiac catheterization N/A 11/07/2014    Procedure: Left Heart Cath and Coronary Angiography;  Surgeon: Belva Crome, MD;  Location: [place] CV LAB;  Service: Cardiovascular;  Laterality: N/A;  . Colonoscopy      Allergies: Review of patient's allergies indicates no known allergies.  Medications:  Current facility-administered medications:  .  0.9 %  sodium chloride infusion, , Intravenous, Continuous, Willia Craze, NP, Last Rate: 75 mL/hr at 05/11/15 0933 .  acetaminophen (TYLENOL) tablet 650 mg, 650 mg, Oral, Q6H PRN **OR** acetaminophen (TYLENOL) suppository 650 mg, 650 mg, Rectal, Q6H PRN, Willia Craze, NP .  acetaminophen (TYLENOL) tablet 1,000 mg, 1,000 mg, Oral, Q6H PRN, Willia Craze, NP .  aspirin EC tablet 81 mg, 81 mg, Oral, QHS, Willia Craze, NP, 81 mg at 05/10/15 2200 .  atorvastatin (LIPITOR) tablet 80 mg, 80 mg, Oral, q1800, Willia Craze, NP, 80 mg at 05/10/15 2105 .  carvedilol (COREG) tablet 25 mg, 25 mg, Oral, BID WC, Willia Craze, NP, 25 mg at 05/11/15 0924 .  HYDROcodone-acetaminophen (NORCO/VICODIN) 5-325 MG per tablet 1-2 tablet, 1-2 tablet, Oral, Q4H PRN, Willia Craze, NP .  insulin aspart (novoLOG) injection 0-9 Units, 0-9 Units, Subcutaneous, TID WC, Willia Craze, NP, 2 Units at 05/11/15 1256 .  latanoprost (XALATAN) 0.005 % ophthalmic solution 1 drop, 1 drop,  Both Eyes, QHS, Willia Craze, NP, 1 drop at 05/10/15 2107 .  metoCLOPramide (REGLAN) injection 10 mg, 10 mg, Intravenous, Q6H PRN, Vena Rua, PA-C .  ondansetron [facility]) injection 4 mg, 4 mg, Intravenous, Once, Julianne Rice, MD, Stopped at 05/10/15 1203 .  ondansetron (ZOFRAN) tablet 4 mg, 4 mg, Oral, Q6H PRN **OR** ondansetron (ZOFRAN) injection 4 mg, 4 mg, Intravenous, Q6H PRN, Willia Craze, NP .  pantoprazole (PROTONIX) EC tablet 40 mg, 40 mg, Oral, Q0600, Vena Rua, PA-C, 40 mg at  05/11/15 0924 .  peg 3350 powder (MOVIPREP) kit 100 g, 0.5 kit, Oral, Once, Cherene Altes, MD .  Derrill Memo ON 05/12/2015] peg 3350 powder (MOVIPREP) kit 100 g, 0.5 kit, Oral, Once, Cherene Altes, MD .  pentoxifylline (TRENTAL) CR tablet 400 mg, 400 mg, Oral, BID, Willia Craze, NP, 400 mg at 05/11/15 0924 .  sodium chloride flush (NS) 0.9 % injection 3 mL, 3 mL, Intravenous, Q12H, Willia Craze, NP, 3 mL at 05/11/15 0930 .  timolol (TIMOPTIC) 0.5 % ophthalmic solution 1 drop, 1 drop, Both Eyes, q morning - 10a, Willia Craze, NP, 1 drop at 05/11/15 2725    Family History  Problem Relation Age of Onset  . Diabetes      brothers and sisters  . Hypertension Sister   . Coronary artery disease    . Stroke Mother   . Hypertension Mother   . Cancer Mother   . Heart attack Sister     Social History   Social History  . Marital Status: Widowed    Spouse Name: N/A  . Number of Children: N/A  . Years of Education: N/A   Social History Main Topics  . Smoking status: Former Smoker -- 0.50 packs/day for 20 years    Types: Cigarettes  . Smokeless tobacco: Never Used     Comment: "quit smoking cigarettes in the 1970's  . Alcohol Use: No  . Drug Use: No  . Sexual Activity: Not Currently   Other Topics Concern  . None   Social History Narrative     Review of Systems: A 12 point ROS discussed and pertinent positives are indicated in the HPI above.  All other systems are negative.  Review of Systems  Vital Signs: BP 137/60 mmHg  Pulse 65  Temp(Src) 98.4 F (36.9 C) (Oral)  Resp 14  Ht 5' 11"  (1.803 m)  Wt 166 lb 7.2 oz (75.5 kg)  BMI 23.22 kg/m2  SpO2 98%  Physical Exam  Constitutional: He is oriented to person, place, and time. He appears well-developed and well-nourished. No distress.  HENT:  Head: Normocephalic.  Mouth/Throat: Oropharynx is clear and moist.  Neck: Normal range of motion. No tracheal deviation present.  Cardiovascular: Normal rate, regular  rhythm and normal heart sounds.   Pulmonary/Chest: Effort normal and breath sounds normal. No respiratory distress.  Abdominal: Soft. He exhibits no distension and no mass. There is no tenderness.  Neurological: He is alert and oriented to person, place, and time.  Psychiatric: He has a normal mood and affect. Judgment normal.    Mallampati Score:  MD Evaluation Airway: WNL Heart: WNL Abdomen: WNL Chest/ Lungs: WNL ASA  Classification: 3 Mallampati/Airway Score: Two  Imaging: Nm Gi Blood Loss  05/11/2015  CLINICAL DATA:  Patient with hematochezia. EXAM: NUCLEAR MEDICINE GASTROINTESTINAL BLEEDING SCAN TECHNIQUE: Sequential abdominal images were obtained following intravenous administration of Tc-4mlabeled red blood cells. RADIOPHARMACEUTICALS:  25.3 mCi Tc-979mn-vitro labeled  red cells. COMPARISON:  Renal ultrasound 11/14/2014 FINDINGS: Radiotracer is demonstrated coursing within the left lower quadrant towards the rectum, likely emanating from the sigmoid colon. Radiotracer is demonstrated within the blood pool. IMPRESSION: Findings compatible with acute GI bleed, likely emanating from the sigmoid colon. Critical Value/emergent results were called by telephone at the time of interpretation on 05/11/2015 at 12:45 pm to Dr. Azucena Freed , who verbally acknowledged these results. Electronically Signed   By: Lovey Newcomer M.D.   On: 05/11/2015 12:58    Labs:  CBC:  Recent Labs  05/10/15 1032 05/10/15 2222 05/10/15 2350 05/11/15 0702  WBC 8.7 6.8 6.6 5.9  HGB 8.8* 8.5* 8.4* 8.6*  HCT 27.9* 27.0* 26.6* 27.0*  PLT 219 190 183 193    COAGS:  Recent Labs  09/10/14 2033 11/07/14 0358 05/10/15 1135  INR 1.02 1.46 1.23  APTT 36  --  23*    BMP:  Recent Labs  12/31/14 0254 01/01/15 0411  01/15/15 1247 02/04/15 1101 05/10/15 1032 05/11/15 0702  NA 140 140  < > 138 139 138 142  K 3.6 3.6  < > 4.3 4.3 4.4 4.9  CL 101 104  < > 103 104 105 113*  CO2 29 27  < > 24 27 19*  21*  GLUCOSE 166* 135*  < > 119* 113* 136* 121*  BUN 38* 33*  < > 48* 41* 69* 57*  CALCIUM 9.6 9.5  < > 9.3 10.4* 9.1 8.9  CREATININE 3.14* 3.02*  < > 2.81* 3.09* 4.40* 3.38*  GFRNONAA 17* 18*  --   --   --  11* 16*  GFRAA 20* 21*  --   --   --  13* 18*  < > = values in this interval not displayed.  LIVER FUNCTION TESTS:  Recent Labs  11/11/14 1055 11/12/14 0355 12/29/14 1146 05/10/15 1032  BILITOT  --   --  0.6 0.5  AST  --   --  33 16  ALT  --   --  47 12*  ALKPHOS  --   --  69 63  PROT  --   --  6.1* 6.8  ALBUMIN 3.3* 3.2* 3.5 3.7    Assessment and Plan: Lower GI bleed, presumptive Descending/sigmoid colon based on NM RBC scan and clinical hx. Currently stable, BP 137/60, Hgb 8.6 Still having intermittent hematochezia. Discussed potential need for mesenteric angiogram and embolization. Risks and Benefits discussed with the patient including, but not limited to bleeding, groin hematoma, infection, vascular injury or contrast induced acute on chronic renal failure.  Plan for now is conservative management. Plavix/ASA being held. If pt remains stable, plan is for bowel prep this evening and colonoscopy tomorrow. If pt destabilizes or recurrent large volume hematochezia, can proceed with angiogram. This is verbalized to pt and son, who are in agreement.    Thank you for this interesting consult.    A copy of this report was sent to the requesting provider on this date.  Electronically Signed: Ascencion Dike 05/11/2015, 2:06 PM   I spent a total of 82mnutes in face to face in clinical consultation, greater than 50% of which was counseling/coordinating care for lower GI bleed

## 2015-05-12 ENCOUNTER — Inpatient Hospital Stay (HOSPITAL_COMMUNITY): Payer: Commercial Managed Care - HMO | Admitting: Certified Registered Nurse Anesthetist

## 2015-05-12 ENCOUNTER — Encounter (HOSPITAL_COMMUNITY)
Admission: EM | Disposition: A | Discharge status: Home / Self Care | Payer: Self-pay | Source: Home / Self Care | Attending: Internal Medicine

## 2015-05-12 ENCOUNTER — Encounter (HOSPITAL_COMMUNITY): Payer: Self-pay | Admitting: Certified Registered Nurse Anesthetist

## 2015-05-12 DIAGNOSIS — E1129 Type 2 diabetes mellitus with other diabetic kidney complication: Secondary | ICD-10-CM

## 2015-05-12 DIAGNOSIS — N184 Chronic kidney disease, stage 4 (severe): Secondary | ICD-10-CM

## 2015-05-12 DIAGNOSIS — K5730 Diverticulosis of large intestine without perforation or abscess without bleeding: Secondary | ICD-10-CM

## 2015-05-12 DIAGNOSIS — R809 Proteinuria, unspecified: Secondary | ICD-10-CM

## 2015-05-12 DIAGNOSIS — I9589 Other hypotension: Secondary | ICD-10-CM

## 2015-05-12 DIAGNOSIS — I5042 Chronic combined systolic (congestive) and diastolic (congestive) heart failure: Secondary | ICD-10-CM | POA: Diagnosis present

## 2015-05-12 DIAGNOSIS — Z951 Presence of aortocoronary bypass graft: Secondary | ICD-10-CM | POA: Diagnosis present

## 2015-05-12 HISTORY — PX: COLONOSCOPY: SHX5424

## 2015-05-12 LAB — COMPREHENSIVE METABOLIC PANEL
ALBUMIN: 3 g/dL — AB (ref 3.5–5.0)
ALT: 10 U/L — AB (ref 17–63)
AST: 15 U/L (ref 15–41)
Alkaline Phosphatase: 43 U/L (ref 38–126)
Anion gap: 8 (ref 5–15)
BUN: 50 mg/dL — AB (ref 6–20)
CHLORIDE: 116 mmol/L — AB (ref 101–111)
CO2: 20 mmol/L — AB (ref 22–32)
CREATININE: 3.06 mg/dL — AB (ref 0.61–1.24)
Calcium: 8.6 mg/dL — ABNORMAL LOW (ref 8.9–10.3)
GFR calc Af Amer: 21 mL/min — ABNORMAL LOW (ref >60)
GFR, EST NON AFRICAN AMERICAN: 18 mL/min — AB (ref >60)
GLUCOSE: 133 mg/dL — AB (ref 65–99)
POTASSIUM: 4.8 mmol/L (ref 3.5–5.1)
SODIUM: 144 mmol/L (ref 135–145)
Total Bilirubin: 0.6 mg/dL (ref 0.3–1.2)
Total Protein: 5.3 g/dL — ABNORMAL LOW (ref 6.5–8.1)

## 2015-05-12 LAB — CBC
HEMATOCRIT: 21.8 % — AB (ref 39.0–52.0)
Hemoglobin: 7 g/dL — ABNORMAL LOW (ref 13.0–17.0)
MCH: 27 pg (ref 26.0–34.0)
MCHC: 31.7 g/dL (ref 30.0–36.0)
MCV: 85.2 fL (ref 78.0–100.0)
PLATELETS: 186 10*3/uL (ref 150–400)
RBC: 2.56 MIL/uL — ABNORMAL LOW (ref 4.22–5.81)
RDW: 16.9 % — AB (ref 11.5–15.5)
WBC: 6.2 10*3/uL (ref 4.0–10.5)

## 2015-05-12 LAB — HEMOGLOBIN AND HEMATOCRIT, BLOOD
HCT: 22.6 % — ABNORMAL LOW (ref 39.0–52.0)
HEMATOCRIT: 25.3 % — AB (ref 39.0–52.0)
HEMOGLOBIN: 8.3 g/dL — AB (ref 13.0–17.0)
Hemoglobin: 7.5 g/dL — ABNORMAL LOW (ref 13.0–17.0)

## 2015-05-12 LAB — GLUCOSE, CAPILLARY
GLUCOSE-CAPILLARY: 127 mg/dL — AB (ref 65–99)
GLUCOSE-CAPILLARY: 151 mg/dL — AB (ref 65–99)
Glucose-Capillary: 176 mg/dL — ABNORMAL HIGH (ref 65–99)
Glucose-Capillary: 160 mg/dL — ABNORMAL HIGH (ref 65–99)
Glucose-Capillary: 124 mg/dL — ABNORMAL HIGH (ref 65–99)

## 2015-05-12 LAB — PREPARE RBC (CROSSMATCH)

## 2015-05-12 SURGERY — COLONOSCOPY
Anesthesia: Monitor Anesthesia Care

## 2015-05-12 MED ORDER — PROPOFOL 500 MG/50ML IV EMUL
INTRAVENOUS | Status: DC | PRN
  Administered 2015-05-12: 100 ug/kg/min via INTRAVENOUS
  Administered 2015-05-12: 13:00:00 via INTRAVENOUS

## 2015-05-12 MED ORDER — SODIUM CHLORIDE 0.9 % IV SOLN
Freq: Once | INTRAVENOUS | Status: AC
  Administered 2015-05-12: 17:00:00 via INTRAVENOUS

## 2015-05-12 MED ORDER — WHITE PETROLATUM GEL
Status: AC
  Administered 2015-05-12: 1
  Filled 2015-05-12: qty 1

## 2015-05-12 MED ORDER — PANTOPRAZOLE SODIUM 40 MG PO TBEC
40.0000 mg | DELAYED_RELEASE_TABLET | Freq: Two times a day (BID) | ORAL | Status: DC
  Administered 2015-05-12 – 2015-05-13 (×2): 40 mg via ORAL
  Filled 2015-05-12 (×2): qty 1

## 2015-05-12 MED ORDER — SODIUM CHLORIDE 0.9 % IV SOLN: INTRAVENOUS | Status: DC

## 2015-05-12 MED ORDER — SODIUM CHLORIDE 0.9 % IV SOLN
Freq: Once | INTRAVENOUS | Status: AC
  Administered 2015-05-12: 09:00:00 via INTRAVENOUS

## 2015-05-12 MED ORDER — PROPOFOL 10 MG/ML IV BOLUS
INTRAVENOUS | Status: DC | PRN
  Administered 2015-05-12 (×2): 20 mg via INTRAVENOUS

## 2015-05-12 NOTE — Progress Notes (Signed)
Pt transferred to [facility] from 2C05. Pt is A&Ox4. Pt lives at home. Pt's skin is WNL. Pt oriented to unit and room. Will continue to monitor. [name]

## 2015-05-12 NOTE — Progress Notes (Signed)
Received report from [facility]. Will continue to monitor pt. Ranelle Oyster, RN

## 2015-05-12 NOTE — Interval H&P Note (Signed)
History and Physical Interval Note:  05/12/2015 11:57 AM  [name]  has presented today for surgery, with the diagnosis of hematochezia, sigmoid locus for bleeding per nuclear med RBC bleeding scan.  The various methods of treatment have been discussed with the patient and family. After consideration of risks, benefits and other options for treatment, the patient has consented to  Procedure(s): COLONOSCOPY (N/A) as a surgical intervention .  The patient's history has been reviewed, patient examined, no change in status, stable for surgery.  I have reviewed the patient's chart and labs.  Questions were answered to the patient's satisfaction.     Milus Banister

## 2015-05-12 NOTE — H&P (View-Only) (Signed)
Daily Rounding Note  05/11/2015, 8:34 AM  LOS: 1 day   SUBJECTIVE:       Large episode of hematochezia this AM.  Feels well, no dizziness, palpitations,  Chest pain.  No n/v.  No abd pain.   OBJECTIVE:         Vital signs in last 24 hours:    Temp:  [97.3 F (36.3 C)-98.9 F (37.2 C)] 98.4 F (36.9 C) (03/13 0427) Pulse Rate:  [55-78] 70 (03/13 0427) Resp:  [12-25] 16 (03/13 0427) BP: (97-131)/(50-67) 121/50 mmHg (03/13 0427) SpO2:  [97 %-100 %] 99 % (03/13 0427) Weight:  [75.5 kg (166 lb 7.2 oz)-77.282 kg (170 lb 6 oz)] 75.5 kg (166 lb 7.2 oz) (03/13 0500) Last BM Date: 05/11/15 Filed Weights   05/10/15 1028 05/10/15 1454 05/11/15 0500  Weight: 77.282 kg (170 lb 6 oz) 76 kg (167 lb 8.8 oz) 75.5 kg (166 lb 7.2 oz)   General: pleasant, alert.  NAD.  Looks well   Heart: RRR Chest: clear bil.  No labored resps or cough Abdomen: soft, active BS.  NT, ND.   Extremities: no CCE.  Neuro/Psych:  Pleasant, cooperative.  Fully alert and oriented.  No gross deficits.   Intake/Output from previous day: 03/12 0701 - 03/13 0700 In: 905 [P.O.:400; I.V.:50; Blood:455] Out: -   Intake/Output this shift:    Lab Results:  Recent Labs  05/10/15 2222 05/10/15 2350 05/11/15 0702  WBC 6.8 6.6 5.9  HGB 8.5* 8.4* 8.6*  HCT 27.0* 26.6* 27.0*  PLT 190 183 193   BMET  Recent Labs  05/10/15 1032 05/11/15 0702  NA 138 142  K 4.4 4.9  CL 105 113*  CO2 19* 21*  GLUCOSE 136* 121*  BUN 69* 57*  CREATININE 4.40* 3.38*  CALCIUM 9.1 8.9   LFT  Recent Labs  05/10/15 1032  PROT 6.8  ALBUMIN 3.7  AST 16  ALT 12*  ALKPHOS 63  BILITOT 0.5   PT/INR  Recent Labs  05/10/15 1135  LABPROT 15.7*  INR 1.23   Hepatitis Panel No results for input(s): HEPBSAG, HCVAB, HEPAIGM, HEPBIGM in the last 72 hours.  Studies/Results: No results found.   Scheduled Meds: . aspirin EC  81 mg Oral QHS  . atorvastatin  80 mg Oral  q1800  . carvedilol  25 mg Oral BID WC  . insulin aspart  0-9 Units Subcutaneous TID WC  . latanoprost  1 drop Both Eyes QHS  . ondansetron  4 mg Intravenous Once  . pantoprazole (PROTONIX) IV  40 mg Intravenous Q12H  . pentoxifylline  400 mg Oral BID  . sodium chloride flush  3 mL Intravenous Q12H  . timolol  1 drop Both Eyes q morning - 10a   Continuous Infusions: . sodium chloride 75 mL/hr at 05/10/15 1632   PRN Meds:.acetaminophen **OR** acetaminophen, acetaminophen, HYDROcodone-acetaminophen, ondansetron **OR** ondansetron (ZOFRAN) IV   ASSESMENT:   *  Hematochezia.  Ongoing.   *  ABL anemia.  MCV normal.  S/p PRBC x 1.    *  Gastric antral ulcer, atrophic gastric mucosa on 05/2011 EGD (for anemia,  FOBT +, n/v).  Path: benign gastric ulcer, no H Pylori.   *  Chronic Plavix (on hold, indication not clearly outlined but have not yet reviewed all notes), ASA  *  IDDM.     PLAN   *  Stop IV PPI.  Oral PPI given hx of stomach ulcers,  though no PPI in use PTA.   *  nuc med scan today.  Continue clears for now    Azucena Freed  05/11/2015, 8:34 AM Pager: [phone]   ________________________________________________________________________  Velora Heckler GI MD note:  I personally examined the patient, reviewed the data and agree with the assessment and plan described above.  Two somewhat bloody BMs this AM however HD stable and Hb is not decreased since yesterday.  Have ordered nuc med bleeding scan.  I spoke with his son on phone about the plan for today.  Given renal insufficiency it will probably be safest to proceed with colonoscopy rather than IR attempt pending outcome of nuc scan (if positive).  If neg bleeding scan, i'll give this another day to stop on its own before prepping for colonsocopy.    Owens Loffler, MD [facility] Pager [phone]

## 2015-05-12 NOTE — Anesthesia Procedure Notes (Signed)
Procedure Name: MAC Date/Time: 05/12/2015 12:27 PM Performed by: Salli Quarry Dee Maday Pre-anesthesia Checklist: Patient identified, Emergency Drugs available, Suction available and Patient being monitored Patient Re-evaluated:Patient Re-evaluated prior to inductionOxygen Delivery Method: Simple face mask

## 2015-05-12 NOTE — Anesthesia Preprocedure Evaluation (Addendum)
Anesthesia Evaluation  Patient identified by MRN, date of birth, ID band Patient awake    Reviewed: Allergy & Precautions, NPO status , Patient's Chart, lab work & pertinent test results  History of Anesthesia Complications Negative for: history of anesthetic complications  Airway Mallampati: II  TM Distance: >3 FB Neck ROM: Full    Dental no notable dental hx. (+) Dental Advisory Given   Pulmonary neg pulmonary ROS, shortness of breath, former smoker,    Pulmonary exam normal breath sounds clear to auscultation       Cardiovascular hypertension, Pt. on medications and Pt. on home beta blockers + CAD, + Past MI, + Peripheral Vascular Disease and +CHF  Normal cardiovascular exam Rhythm:Regular Rate:Normal     Neuro/Psych negative neurological ROS  negative psych ROS   GI/Hepatic Neg liver ROS, PUD,   Endo/Other  diabetes, Insulin Dependent  Renal/GU CRFRenal disease  negative genitourinary   Musculoskeletal negative musculoskeletal ROS (+) Arthritis ,   Abdominal   Peds negative pediatric ROS (+)  Hematology  (+) anemia ,   Anesthesia Other Findings   Reproductive/Obstetrics negative OB ROS                            Anesthesia Physical Anesthesia Plan  ASA: III  Anesthesia Plan: MAC   Post-op Pain Management:    Induction: Intravenous  Airway Management Planned: Simple Face Mask  Additional Equipment:   Intra-op Plan:   Post-operative Plan:   Informed Consent: I have reviewed the patients History and Physical, chart, labs and discussed the procedure including the risks, benefits and alternatives for the proposed anesthesia with the patient or authorized representative who has indicated his/her understanding and acceptance.   Dental advisory given  Plan Discussed with: CRNA and Surgeon  Anesthesia Plan Comments:         Anesthesia Quick Evaluation

## 2015-05-12 NOTE — Transfer of Care (Signed)
Immediate Anesthesia Transfer of Care Note  Patient: [name]  Procedure(s) Performed: Procedure(s): COLONOSCOPY (N/A)  Patient Location: Endoscopy Unit  Anesthesia Type:MAC  Level of Consciousness: awake, alert  and patient cooperative  Airway & Oxygen Therapy: Patient Spontanous Breathing and Patient connected to face mask oxygen  Post-op Assessment: Report given to RN and Post -op Vital signs reviewed and stable  Post vital signs: Reviewed and stable  Last Vitals:  Filed Vitals:   05/12/15 1126 05/12/15 1154  BP: 114/46 135/77  Pulse: 73 69  Temp: 37 C 36.6 C  Resp: 17 18    Complications: No apparent anesthesia complications

## 2015-05-12 NOTE — Op Note (Signed)
[facility] Patient Name: [name] Procedure Date : 05/12/2015 MRN: [id number] Attending MD: Milus Banister , MD Date of Birth: 08-Aug-1931 CSN: [id number] Age: 82 Admit Type: Inpatient Procedure:                Colonoscopy Indications:              Hematochezia Providers:                Milus Banister, MD, Malka So, RN, Despina Pole, Technician Referring MD:             Silvano Rusk, MD Medicines:                Monitored Anesthesia Care Complications:            No immediate complications. Estimated Blood Loss:     Estimated blood loss: none. Procedure:                Pre-Anesthesia Assessment:                           - Prior to the procedure, a History and Physical                            was performed, and patient medications and                            allergies were reviewed. The patient's tolerance of                            previous anesthesia was also reviewed. The risks                            and benefits of the procedure and the sedation                            options and risks were discussed with the patient.                            All questions were answered, and informed consent                            was obtained. Prior Anticoagulants: The patient has                            taken Plavix (clopidogrel). ASA Grade Assessment:                            III - A patient with severe systemic disease. After                            reviewing the risks and benefits, the patient was  deemed in satisfactory condition to undergo the                            procedure.                           After obtaining informed consent, the colonoscope                            was passed under direct vision. Throughout the                            procedure, the patient's blood pressure, pulse, and                            oxygen saturations were monitored  continuously. The                            [id number]) scope was introduced through                            the anus and advanced to the the cecum, identified                            by appendiceal orifice and ileocecal valve. The                            colonoscopy was performed without difficulty. The                            patient tolerated the procedure well. The quality                            of the bowel preparation was good. The ileocecal                            valve, appendiceal orifice, and rectum were                            photographed. Scope In: Scope Out: Findings:      There was fresh red blood throughout the colon, the brightest red was in       the left colon in region of extensive large and small diverticulum. I       infused the colon with water extensively from the cecum to the rectum,       washing and suctioning along the way to try to locate the exact site of       bleeding. Clearly the largest amount of fresh red blood as well as a few       small blood clots, was located in the left colon. I advanced the scope       in and out of this region, flushing and washing the colon but never       located the exact site of bleeding as the bleeding seemd to have stopped       completely by the  end of the procedure. I passed the scope back to the       cecum another time, flushing and washing on the way out again and there       was no further fresh red blood.      The exam was otherwise without abnormality. Impression:               - Blood throughout the colon, presumed from left                            sided diverticular bleeding (based on this exam and                            also yesterdays nuc med bleeding scan). The exact                            site was never located.                           - The examination was otherwise normal.                           - No specimens collected. Moderate Sedation:       None Recommendation:           - Return patient to hospital ward for ongoing care.                           - If overt bleeding resumes options include                            interventional radiology angiogram, repeat                            colonoscopy, surgical resection of left colon. Procedure Code(s):        --- Professional ---                           [phone], Colonoscopy, flexible; diagnostic, including                            collection of specimen(s) by brushing or washing,                            when performed (separate procedure) Diagnosis Code(s):        --- Professional ---                           K92.1, Melena (includes Hematochezia)                           K57.30, Diverticulosis of large intestine without                            perforation or abscess without bleeding CPT copyright 2016 [employer]. All rights reserved. The codes documented in this report are preliminary and upon coder review  may  be revised to meet current compliance requirements. Milus Banister, MD Milus Banister, MD 05/12/2015 1:42:28 PM Number of Addenda: 0

## 2015-05-12 NOTE — Progress Notes (Addendum)
[place] TEAM 1 - Stepdown/ICU TEAM Progress Note  [name] [id number] DOB: 03-29-1931 DOA: 05/10/2015 PCP: [name], MD  Admit HPI / Brief Narrative: 82 y.o. BM PMHx CKD stage IV, DM Type 2, Gastric ulcer, HTN, CAD native artery, Ischemic Cardiomyopathy, S/P CABG, Chronic combined Systolic and Diastolic CHF, PVD, B 12 deficiency requiring shots,  Admitted October 2016 with NSTEMI / heart failure. Cath revealed occllded SVG to OM1/OM2 which recanalized with IV heparin. No PCI was done.   Patient came to emergency department today with complaints of bloody stool for last 2 days. No associated abdominal pain. He had nausea without vomiting this morning. No melena. Patient takes a daily aspirin, no other NSAIDs. He is on Plavix.    HPI/Subjective: 3/14 A/O 4, positive fatigue from acute blood loss, bright red blood in toilet bowl.  Hematochezia - Gastrointestinal bleeding -Nuclear RBC scan suggests bleeding in sigmoid colon region -Protonix 40 mg BID - GI to perform Colonoscopy today; will await recommendations   Acute blood loss anemia  -3/14 Transfuse 1 unit RBC  - H/H QID  -See systolic and diastolic CHF  DM2 with renal complications -December [id number] Hemoglobin A1c =7.7  -Moderate SSI   Acute hypotension/ Hypertension -BP currently well controlled  -Coreg 25 mg BID -Imdur 60 mg daily   Acute renal failure superimposed on CKD stage IV(Baseline Cr  2.8-3.5)  - Cr improving w/ volume expansion  CAD s/ remote CABG - NSTEMI October 2016 revealing occluded graft -No chest pain  Chronic combined systolic and diastolic heart failure EF 40-45% - no evidence of volume overload at present -Transfuse for hemoglobin<8 -3/14 Transfuse 2 unit PRBC  Dyslipidemia -Lipitor 80 mg daily      Code Status: FULL Family Communication: no family present at time of exam Disposition Plan: Resolution GI bleed    Consultants: Dr. Luan Moore GI    Procedure/Significant Events: 3/13 - Nuc Med bleeding scan; acute GI bleed, likely emanating from the sigmoid colon. 3/14 Transfuse 2 unit PRBC  Culture NA  Antibiotics: NA   DVT prophylaxis: SCD   Devices NA   LINES / TUBES:  NA    Continuous Infusions: . sodium chloride 50 mL/hr at 05/12/15 0200  . sodium chloride      Objective: VITAL SIGNS: Temp: 97.9 F (36.6 C) (03/14 1026) Temp Source: Oral (03/14 1026) BP: 132/56 mmHg (03/14 1026) Pulse Rate: 71 (03/14 1026) SPO2; FIO2:   Intake/Output Summary (Last 24 hours) at 05/12/15 1109 Last data filed at 05/12/15 0600  Gross per 24 hour  Intake   2505 ml  Output      0 ml  Net   2505 ml     Exam: General: A/O 4, positive fatigue (acute blood loss), No acute respiratory distress Eyes: Negative headache, negative scleral hemorrhage ENT: Negative Runny nose, negative gingival bleeding, Neck:  Negative scars, masses, torticollis, lymphadenopathy, JVD Lungs: Clear to auscultation bilaterally without wheezes or crackles Cardiovascular: Regular rate and rhythm without murmur gallop or rub normal S1 and S2 Abdomen:negative abdominal pain, nondistended, positive soft, bowel sounds, no rebound, no ascites, no appreciable mass Extremities: No significant cyanosis, clubbing, or edema bilateral lower extremities Psychiatric:  Negative depression, negative anxiety, negative fatigue, negative mania  Neurologic:  Cranial nerves II through XII intact, tongue/uvula midline, all extremities muscle strength 5/5, sensation intact throughout,  negative dysarthria, negative expressive aphasia, negative receptive aphasia.   Data Reviewed: Basic Metabolic Panel:  Recent Labs Lab 05/10/15 1032  05/11/15 0702 05/12/15 0300  NA 138 142 144  K 4.4 4.9 4.8  CL 105 113* 116*  CO2 19* 21* 20*  GLUCOSE 136* 121* 133*  BUN 69* 57* 50*  CREATININE 4.40* 3.38* 3.06*  CALCIUM 9.1 8.9 8.6*   Liver Function  Tests:  Recent Labs Lab 05/10/15 1032 05/12/15 0300  AST 16 15  ALT 12* 10*  ALKPHOS 63 43  BILITOT 0.5 0.6  PROT 6.8 5.3*  ALBUMIN 3.7 3.0*   No results for input(s): LIPASE, AMYLASE in the last 168 hours. No results for input(s): AMMONIA in the last 168 hours. CBC:  Recent Labs Lab 05/10/15 2222 05/10/15 2350 05/11/15 0702 05/11/15 1755 05/12/15 0300  WBC 6.8 6.6 5.9 6.2 6.2  HGB 8.5* 8.4* 8.6* 7.9* 7.0*  HCT 27.0* 26.6* 27.0* 25.0* 21.8*  MCV 83.9 83.6 84.4 83.9 85.2  PLT 190 183 193 202 186   Cardiac Enzymes: No results for input(s): CKTOTAL, CKMB, CKMBINDEX, TROPONINI in the last 168 hours. BNP (last 3 results)  Recent Labs  12/29/14 1140  BNP 1114.2*    ProBNP (last 3 results) No results for input(s): PROBNP in the last 8760 hours.  CBG:  Recent Labs Lab 05/11/15 1246 05/11/15 1739 05/11/15 2240 05/12/15 0828 05/12/15 1029  GLUCAP 155* 121* 113* 176* 160*    Recent Results (from the past 240 hour(s))  MRSA PCR Screening     Status: None   Collection Time: 05/10/15  4:21 PM  Result Value Ref Range Status   MRSA by PCR NEGATIVE NEGATIVE Final    Comment:        The GeneXpert MRSA Assay (FDA approved for NASAL specimens only), is one component of a comprehensive MRSA colonization surveillance program. It is not intended to diagnose MRSA infection nor to guide or monitor treatment for MRSA infections.      Studies:  Recent x-ray studies have been reviewed in detail by the Attending Physician  Scheduled Meds:  Scheduled Meds: . sodium chloride   Intravenous Once  . aspirin EC  81 mg Oral QHS  . atorvastatin  80 mg Oral q1800  . carvedilol  25 mg Oral BID WC  . cloNIDine  0.1 mg Oral QHS  . insulin aspart  0-15 Units Subcutaneous TID WC  . insulin aspart  0-5 Units Subcutaneous QHS  . isosorbide mononitrate  60 mg Oral Daily  . latanoprost  1 drop Both Eyes QHS  . pantoprazole  40 mg Oral BID  . pentoxifylline  400 mg Oral BID    . sodium chloride flush  3 mL Intravenous Q12H  . timolol  1 drop Both Eyes q morning - 10a    Time spent on care of this patient: 40 mins   WOODS, Geraldo Docker , MD  [facility] Office  [phone] Pager - [phone]  On-Call/Text Page:      [email or web address]      password [email or web address]  If 7PM-7AM, please contact night-coverage [email or web address] Password [email or web address] 05/12/2015, 11:09 AM   LOS: 2 days   Care during the described time interval was provided by me .  I have reviewed this patient's available data, including medical history, events of note, physical examination, and all test results as part of my evaluation. I have personally reviewed and interpreted all radiology studies.   Dia Crawford, MD [phone] Pager

## 2015-05-13 ENCOUNTER — Encounter (HOSPITAL_COMMUNITY): Payer: Self-pay | Admitting: Physician Assistant

## 2015-05-13 DIAGNOSIS — I25700 Atherosclerosis of coronary artery bypass graft(s), unspecified, with unstable angina pectoris: Secondary | ICD-10-CM

## 2015-05-13 DIAGNOSIS — N179 Acute kidney failure, unspecified: Secondary | ICD-10-CM

## 2015-05-13 DIAGNOSIS — N189 Chronic kidney disease, unspecified: Secondary | ICD-10-CM

## 2015-05-13 LAB — GLUCOSE, CAPILLARY
GLUCOSE-CAPILLARY: 169 mg/dL — AB (ref 65–99)
GLUCOSE-CAPILLARY: 122 mg/dL — AB (ref 65–99)
GLUCOSE-CAPILLARY: 131 mg/dL — AB (ref 65–99)
Glucose-Capillary: 140 mg/dL — ABNORMAL HIGH (ref 65–99)

## 2015-05-13 LAB — HEMOGLOBIN AND HEMATOCRIT, BLOOD
HCT: 25 % — ABNORMAL LOW (ref 39.0–52.0)
HEMATOCRIT: 22.4 % — AB (ref 39.0–52.0)
HEMOGLOBIN: 7.3 g/dL — AB (ref 13.0–17.0)
Hemoglobin: 8.1 g/dL — ABNORMAL LOW (ref 13.0–17.0)

## 2015-05-13 LAB — TROPONIN I: Troponin I: 0.13 ng/mL — ABNORMAL HIGH (ref <0.031)

## 2015-05-13 LAB — PREPARE RBC (CROSSMATCH)

## 2015-05-13 MED ORDER — SODIUM CHLORIDE 0.9 % IV SOLN: Freq: Once | INTRAVENOUS | Status: DC

## 2015-05-13 MED ORDER — PANTOPRAZOLE SODIUM 40 MG PO TBEC
40.0000 mg | DELAYED_RELEASE_TABLET | Freq: Every day | ORAL | Status: DC
  Administered 2015-05-14: 40 mg via ORAL
  Filled 2015-05-13: qty 1

## 2015-05-13 MED ORDER — NITROGLYCERIN 0.4 MG SL SUBL: 0.4000 mg | SUBLINGUAL_TABLET | SUBLINGUAL | Status: DC | PRN

## 2015-05-13 NOTE — Progress Notes (Addendum)
RN, Davy Pique, paged because pt was c/o left arm pain. No sternal pain or jaw pain. No n/v. VSS. Pt with hx CAD. Takes NTG prn at home. Pain completely relieved with one NTG tab. 12 lead shows no acute changes when compared to 2016 EKG on chart. Will cycle troponins. KJKG, NP [facility] Update: 1st troponin .13. However, can not put pt on anticoagulation because of GIB. ASA 81mg  daily has been continued but Plavix is on hold. Received 4th unit PRBCs today and CBC pending for this am. Will continue to monitor for CP and/or EKG changes/rising troponin.  KJKG, NP [facility]

## 2015-05-13 NOTE — Progress Notes (Signed)
Notified Baltazar Najjar, NP that pt's Hbg is 7.3. NP gave order to transfuse 1 unit PRBC. Will continue to monitor pt.  Ranelle Oyster, RN

## 2015-05-13 NOTE — Care Management Important Message (Signed)
Important Message  Patient Details  Name: [name] MRN: [id number] Date of Birth: 03/05/1931   Medicare Important Message Given:  Yes    [name] May 05/13/2015, 9:58 AM

## 2015-05-13 NOTE — Progress Notes (Signed)
Daily Rounding Note  05/13/2015, 9:06 AM  LOS: 3 days   SUBJECTIVE:       2 episodes hematochezia last night after colonoscopy, the second episode was smaller volume.  Still on clears.  Pt feels ok.    OBJECTIVE:         Vital signs in last 24 hours:    Temp:  [97.3 F (36.3 C)-98.7 F (37.1 C)] 98.3 F (36.8 C) (03/15 0818) Pulse Rate:  [61-80] 74 (03/15 0818) Resp:  [11-25] 18 (03/15 0818) BP: (104-143)/(46-77) 132/64 mmHg (03/15 0818) SpO2:  [95 %-100 %] 95 % (03/15 0818) Weight:  [75.297 kg (166 lb)-77.202 kg (170 lb 3.2 oz)] 77.202 kg (170 lb 3.2 oz) (03/15 0637) Last BM Date: 05/12/15 Filed Weights   05/11/15 0500 05/12/15 1154 05/13/15 0637  Weight: 75.5 kg (166 lb 7.2 oz) 75.297 kg (166 lb) 77.202 kg (170 lb 3.2 oz)   General: does not look ill   Heart: RRR Chest: clear bil.   Abdomen: NT, ND.  Active BS.   Extremities: no CCE Neuro/Psych:  Oriented x 3.  Alert, calm.   Intake/Output from previous day: 03/14 0701 - 03/15 0700 In: 1853 [P.O.:60; I.V.:1175; Blood:618] Out: 1115 [Urine:1100; Blood:15]  Intake/Output this shift:    Lab Results:  Recent Labs  05/11/15 0702 05/11/15 1755 05/12/15 0300 05/12/15 1446 05/12/15 2154 05/13/15 0416  WBC 5.9 6.2 6.2  --   --   --   HGB 8.6* 7.9* 7.0* 7.5* 8.3* 7.3*  HCT 27.0* 25.0* 21.8* 22.6* 25.3* 22.4*  PLT 193 202 186  --   --   --    BMET  Recent Labs  05/10/15 1032 05/11/15 0702 05/12/15 0300  NA 138 142 144  K 4.4 4.9 4.8  CL 105 113* 116*  CO2 19* 21* 20*  GLUCOSE 136* 121* 133*  BUN 69* 57* 50*  CREATININE 4.40* 3.38* 3.06*  CALCIUM 9.1 8.9 8.6*   LFT  Recent Labs  05/10/15 1032 05/12/15 0300  PROT 6.8 5.3*  ALBUMIN 3.7 3.0*  AST 16 15  ALT 12* 10*  ALKPHOS 63 43  BILITOT 0.5 0.6   PT/INR  Recent Labs  05/10/15 1135  LABPROT 15.7*  INR 1.23   Hepatitis Panel No results for input(s): HEPBSAG, HCVAB, HEPAIGM,  HEPBIGM in the last 72 hours.  Studies/Results: Nm Gi Blood Loss  05/11/2015  CLINICAL DATA:  Patient with hematochezia. EXAM: NUCLEAR MEDICINE GASTROINTESTINAL BLEEDING SCAN TECHNIQUE: Sequential abdominal images were obtained following intravenous administration of Tc-93m labeled red blood cells. RADIOPHARMACEUTICALS:  25.3 mCi Tc-70m in-vitro labeled red cells. COMPARISON:  Renal ultrasound 11/14/2014 FINDINGS: Radiotracer is demonstrated coursing within the left lower quadrant towards the rectum, likely emanating from the sigmoid colon. Radiotracer is demonstrated within the blood pool. IMPRESSION: Findings compatible with acute GI bleed, likely emanating from the sigmoid colon. Critical Value/emergent results were called by telephone at the time of interpretation on 05/11/2015 at 12:45 pm to Dr. Azucena Freed , who verbally acknowledged these results. Electronically Signed   By: Lovey Newcomer M.D.   On: 05/11/2015 12:58   Scheduled Meds: . sodium chloride   Intravenous Once  . aspirin EC  81 mg Oral QHS  . atorvastatin  80 mg Oral q1800  . carvedilol  25 mg Oral BID WC  . cloNIDine  0.1 mg Oral QHS  . insulin aspart  0-15 Units Subcutaneous TID WC  . insulin aspart  0-5  Units Subcutaneous QHS  . isosorbide mononitrate  60 mg Oral Daily  . latanoprost  1 drop Both Eyes QHS  . pantoprazole  40 mg Oral BID  . pentoxifylline  400 mg Oral BID  . sodium chloride flush  3 mL Intravenous Q12H  . timolol  1 drop Both Eyes q morning - 10a   Continuous Infusions: . sodium chloride 50 mL/hr at 05/12/15 2202   PRN Meds:.acetaminophen **OR** acetaminophen, HYDROcodone-acetaminophen, metoCLOPramide (REGLAN) injection, ondansetron **OR** ondansetron (ZOFRAN) IV   ASSESMENT:   * Hematochezia. Diverticular bleed.  05/11/15 Colonoscopy: fresh blood throughout colon, > in left side in region with extensive diverticulae.  Despite vigorous lavage, MD unable to pinpoint bleeding source.  Presumed left  sided diverticular bleed.    * ABL anemia. MCV normal. S/p PRBC x 3, 4th currently transfusing.  Also has element of anemia of CKD.   * Hx gastric antral ulcer, atrophic gastric mucosa on 05/2011 EGD (for anemia, FOBT +, n/v). Path: benign gastric ulcer, no H Pylori.  On oral Protonix.   * Chronic Plavix (on hold, indication not clearly outlined but have not yet reviewed all notes), ASA.  No absolute indication for Plavix (no vascular stents, no hx CVA):  Indication is CAD (occluded saphenous vein graft)on cath of 10/2014.  EF is 40 to 45%.   * IDDM.   *  CKD stage 4.     PLAN   *  Resume a carb mod diet.   Complete transfusion, CBC in AM.   *  IR had seen pt 1/13.  Did not see pt on 3/14. If bleeding continues, need to reconsider angiogram/embolization.     Azucena Freed  05/13/2015, 9:06 AM Pager: [phone]   ________________________________________________________________________  Velora Heckler GI MD note:  I personally examined the patient, reviewed the data and agree with the assessment and plan described above.  No BM since yesterday afternoon (was trace bloody at that time and I think was still 'old' blood).  Tolerating solid diet.  Will continue to follow, if no overt rebleeding then OK to d/c tomorrow.  Cannot tell why he is on plavix, would not restart the plavix, ever, unless he clearly needs to be on it.  If there is reason for him to be on it, would not restart it for another 4-5 days after d/c.  Also, does he need to be on both ASA and plavix?  Perhaps just one is ok.   Owens Loffler, MD [facility] Pager [phone]

## 2015-05-13 NOTE — Progress Notes (Addendum)
[place] TEAM 1 - Stepdown/ICU TEAM Progress Note  [name] [id number] DOB: 1931-08-07 DOA: 05/10/2015 PCP: [name], MD  Admit HPI / Brief Narrative: 82 y.o. BM PMHx CKD stage IV, DM Type 2, Gastric ulcer, HTN, CAD native artery, Ischemic Cardiomyopathy, S/P CABG, Chronic combined Systolic and Diastolic CHF, PVD, B 12 deficiency requiring shots,  Admitted October 2016 with NSTEMI / heart failure. Cath revealed occllded SVG to OM1/OM2 which recanalized with IV heparin. No PCI was done.   Patient came to emergency department today with complaints of bloody stool for last 2 days. No associated abdominal pain. He had nausea without vomiting this morning. No melena. Patient takes a daily aspirin, no other NSAIDs. He is on Plavix.    HPI/Subjective: 2 episodes hematochezia last night after colonoscopy, the second episode was smaller volume. Still on clears. Pt feels ok  Assessment and plan Hematochezia - Gastrointestinal bleeding -Nuclear RBC scan suggests bleeding in sigmoid colon region -Protonix 40 mg BID 05/11/15 Colonoscopy: fresh blood throughout colon, > in left side in region with extensive diverticulae. Despite vigorous lavage, MD unable to pinpoint bleeding source. Presumed left sided diverticular bleed.  S/p PRBC x 3, 4th currently transfusing, follow CBC  Acute blood loss anemia  S/p PRBC x 3, 4th currently transfusing Hx gastric antral ulcer, atrophic gastric mucosa on 05/2011 EGD (for anemia, FOBT +, n/v). Path: benign gastric ulcer, no H Pylori.   DM2 with renal complications -December [id number] Hemoglobin A1c =7.7  -Moderate SSI   Acute hypotension/ Hypertension -BP currently well controlled  -Coreg 25 mg BID -Imdur 60 mg daily   Acute renal failure superimposed on CKD stage IV(Baseline Cr  2.8-3.5)  - Cr improving w/ volume expansion  CAD s/ remote CABG - NSTEMI October 2016 revealing occluded graft -No chest pain plavix on  hold , cont ASA  Chronic combined systolic and diastolic heart failure EF 40-45% - no evidence of volume overload at present -Transfuse for hemoglobin<8    Dyslipidemia -Lipitor 80 mg daily     Code Status: FULL Family Communication: discussed with son [name] via phone  Disposition Plan: Resolution GI bleed    Consultants: Dr. Luan Moore GI    Procedure/Significant Events: 3/13 - Nuc Med bleeding scan; acute GI bleed, likely emanating from the sigmoid colon. 3/14 Transfuse 2 unit PRBC  Culture NA  Antibiotics: NA   DVT prophylaxis: SCD   Devices NA   LINES / TUBES:  NA    Continuous Infusions: . sodium chloride 50 mL/hr at 05/12/15 2202    Objective: VITAL SIGNS: Temp: 99 F (37.2 C) (03/15 1035) Temp Source: Oral (03/15 1035) BP: 134/66 mmHg (03/15 1035) Pulse Rate: 70 (03/15 1035) SPO2; FIO2:   Intake/Output Summary (Last 24 hours) at 05/13/15 1141 Last data filed at 05/13/15 1035  Gross per 24 hour  Intake   1875 ml  Output   1115 ml  Net    760 ml     Exam: General: A/O 4, positive fatigue (acute blood loss), No acute respiratory distress Eyes: Negative headache, negative scleral hemorrhage ENT: Negative Runny nose, negative gingival bleeding, Neck:  Negative scars, masses, torticollis, lymphadenopathy, JVD Lungs: Clear to auscultation bilaterally without wheezes or crackles Cardiovascular: Regular rate and rhythm without murmur gallop or rub normal S1 and S2 Abdomen:negative abdominal pain, nondistended, positive soft, bowel sounds, no rebound, no ascites, no appreciable mass Extremities: No significant cyanosis, clubbing, or edema bilateral lower extremities Psychiatric:  Negative depression, negative anxiety,  negative fatigue, negative mania  Neurologic:  Cranial nerves II through XII intact, tongue/uvula midline, all extremities muscle strength 5/5, sensation intact throughout,  negative dysarthria, negative  expressive aphasia, negative receptive aphasia.   Data Reviewed: Basic Metabolic Panel:  Recent Labs Lab 05/10/15 1032 05/11/15 0702 05/12/15 0300  NA 138 142 144  K 4.4 4.9 4.8  CL 105 113* 116*  CO2 19* 21* 20*  GLUCOSE 136* 121* 133*  BUN 69* 57* 50*  CREATININE 4.40* 3.38* 3.06*  CALCIUM 9.1 8.9 8.6*   Liver Function Tests:  Recent Labs Lab 05/10/15 1032 05/12/15 0300  AST 16 15  ALT 12* 10*  ALKPHOS 63 43  BILITOT 0.5 0.6  PROT 6.8 5.3*  ALBUMIN 3.7 3.0*   No results for input(s): LIPASE, AMYLASE in the last 168 hours. No results for input(s): AMMONIA in the last 168 hours. CBC:  Recent Labs Lab 05/10/15 2222 05/10/15 2350 05/11/15 0702 05/11/15 1755 05/12/15 0300 05/12/15 1446 05/12/15 2154 05/13/15 0416  WBC 6.8 6.6 5.9 6.2 6.2  --   --   --   HGB 8.5* 8.4* 8.6* 7.9* 7.0* 7.5* 8.3* 7.3*  HCT 27.0* 26.6* 27.0* 25.0* 21.8* 22.6* 25.3* 22.4*  MCV 83.9 83.6 84.4 83.9 85.2  --   --   --   PLT 190 183 193 202 186  --   --   --    Cardiac Enzymes: No results for input(s): CKTOTAL, CKMB, CKMBINDEX, TROPONINI in the last 168 hours. BNP (last 3 results)  Recent Labs  12/29/14 1140  BNP 1114.2*    ProBNP (last 3 results) No results for input(s): PROBNP in the last 8760 hours.  CBG:  Recent Labs Lab 05/12/15 1029 05/12/15 1448 05/12/15 1746 05/12/15 2201 05/13/15 0753  GLUCAP 160* 127* 151* 124* 169*    Recent Results (from the past 240 hour(s))  MRSA PCR Screening     Status: None   Collection Time: 05/10/15  4:21 PM  Result Value Ref Range Status   MRSA by PCR NEGATIVE NEGATIVE Final    Comment:        The GeneXpert MRSA Assay (FDA approved for NASAL specimens only), is one component of a comprehensive MRSA colonization surveillance program. It is not intended to diagnose MRSA infection nor to guide or monitor treatment for MRSA infections.      Studies:  Recent x-ray studies have been reviewed in detail by the Attending  Physician  Scheduled Meds:  Scheduled Meds: . sodium chloride   Intravenous Once  . aspirin EC  81 mg Oral QHS  . atorvastatin  80 mg Oral q1800  . carvedilol  25 mg Oral BID WC  . cloNIDine  0.1 mg Oral QHS  . insulin aspart  0-15 Units Subcutaneous TID WC  . insulin aspart  0-5 Units Subcutaneous QHS  . isosorbide mononitrate  60 mg Oral Daily  . latanoprost  1 drop Both Eyes QHS  . [START ON 05/14/2015] pantoprazole  40 mg Oral Daily  . pentoxifylline  400 mg Oral BID  . sodium chloride flush  3 mL Intravenous Q12H  . timolol  1 drop Both Eyes q morning - 10a    Time spent on care of this patient: 40 mins   Reyne Dumas , MD  [facility] Office  [phone] Pager - [phone]  On-Call/Text Page:      [email or web address]      password [email or web address]  If 7PM-7AM, please contact night-coverage [email or web address] Password [facility] 05/13/2015, 11:41 AM  LOS: 3 days

## 2015-05-13 NOTE — Progress Notes (Signed)
Blood transfusion  Started at [facility], pt tolerating well. No c/o

## 2015-05-13 NOTE — Progress Notes (Signed)
UR COMPLETED  

## 2015-05-13 NOTE — Anesthesia Postprocedure Evaluation (Signed)
Anesthesia Post Note  Patient: [name]  Procedure(s) Performed: Procedure(s) (LRB): COLONOSCOPY (N/A)  Patient location during evaluation: PACU Anesthesia Type: MAC Level of consciousness: awake and alert Pain management: pain level controlled Vital Signs Assessment: post-procedure vital signs reviewed and stable Respiratory status: spontaneous breathing, nonlabored ventilation, respiratory function stable and patient connected to nasal cannula oxygen Cardiovascular status: blood pressure returned to baseline and stable Postop Assessment: no signs of nausea or vomiting Anesthetic complications: no    Last Vitals:  Filed Vitals:   05/13/15 0757 05/13/15 0818  BP: 129/56 132/64  Pulse: 78 74  Temp: 37.1 C 36.8 C  Resp: 19 18    Last Pain:  Filed Vitals:   05/13/15 0821  PainSc: 0-No pain                 Khalaya Mcgurn S

## 2015-05-14 ENCOUNTER — Encounter (HOSPITAL_COMMUNITY): Payer: Self-pay | Admitting: Nurse Practitioner

## 2015-05-14 ENCOUNTER — Inpatient Hospital Stay (HOSPITAL_COMMUNITY): Payer: Commercial Managed Care - HMO

## 2015-05-14 ENCOUNTER — Telehealth: Payer: Self-pay | Admitting: Interventional Cardiology

## 2015-05-14 DIAGNOSIS — I10 Essential (primary) hypertension: Secondary | ICD-10-CM

## 2015-05-14 DIAGNOSIS — M79603 Pain in arm, unspecified: Secondary | ICD-10-CM

## 2015-05-14 DIAGNOSIS — M79602 Pain in left arm: Secondary | ICD-10-CM

## 2015-05-14 DIAGNOSIS — I2510 Atherosclerotic heart disease of native coronary artery without angina pectoris: Secondary | ICD-10-CM | POA: Diagnosis present

## 2015-05-14 DIAGNOSIS — E785 Hyperlipidemia, unspecified: Secondary | ICD-10-CM

## 2015-05-14 LAB — COMPREHENSIVE METABOLIC PANEL
ALT: 13 U/L — ABNORMAL LOW (ref 17–63)
ANION GAP: 8 (ref 5–15)
AST: 19 U/L (ref 15–41)
Albumin: 3.3 g/dL — ABNORMAL LOW (ref 3.5–5.0)
Alkaline Phosphatase: 48 U/L (ref 38–126)
BUN: 23 mg/dL — ABNORMAL HIGH (ref 6–20)
CHLORIDE: 116 mmol/L — AB (ref 101–111)
CO2: 20 mmol/L — ABNORMAL LOW (ref 22–32)
Calcium: 8.9 mg/dL (ref 8.9–10.3)
Creatinine, Ser: 2.55 mg/dL — ABNORMAL HIGH (ref 0.61–1.24)
GFR, EST AFRICAN AMERICAN: 26 mL/min — AB (ref >60)
GFR, EST NON AFRICAN AMERICAN: 22 mL/min — AB (ref >60)
Glucose, Bld: 156 mg/dL — ABNORMAL HIGH (ref 65–99)
POTASSIUM: 4.4 mmol/L (ref 3.5–5.1)
Sodium: 144 mmol/L (ref 135–145)
TOTAL PROTEIN: 5.7 g/dL — AB (ref 6.5–8.1)
Total Bilirubin: 0.5 mg/dL (ref 0.3–1.2)

## 2015-05-14 LAB — TYPE AND SCREEN
ABO/RH(D): O POS
Antibody Screen: NEGATIVE
UNIT DIVISION: 0
UNIT DIVISION: 0
Unit division: 0
Unit division: 0

## 2015-05-14 LAB — GLUCOSE, CAPILLARY
GLUCOSE-CAPILLARY: 138 mg/dL — AB (ref 65–99)
Glucose-Capillary: 136 mg/dL — ABNORMAL HIGH (ref 65–99)

## 2015-05-14 LAB — CBC
HCT: 27.5 % — ABNORMAL LOW (ref 39.0–52.0)
Hemoglobin: 9.3 g/dL — ABNORMAL LOW (ref 13.0–17.0)
MCH: 28.1 pg (ref 26.0–34.0)
MCHC: 33.8 g/dL (ref 30.0–36.0)
MCV: 83.1 fL (ref 78.0–100.0)
PLATELETS: 163 10*3/uL (ref 150–400)
RBC: 3.31 MIL/uL — ABNORMAL LOW (ref 4.22–5.81)
RDW: 17 % — AB (ref 11.5–15.5)
WBC: 7.5 10*3/uL (ref 4.0–10.5)

## 2015-05-14 LAB — TROPONIN I
TROPONIN I: 0.13 ng/mL — AB (ref <0.031)
TROPONIN I: 0.13 ng/mL — AB (ref <0.031)

## 2015-05-14 MED ORDER — FUROSEMIDE 10 MG/ML IJ SOLN
40.0000 mg | Freq: Once | INTRAMUSCULAR | Status: AC
  Administered 2015-05-14: 40 mg via INTRAVENOUS
  Filled 2015-05-14: qty 4

## 2015-05-14 MED ORDER — BISACODYL 5 MG PO TBEC: 10.0000 mg | DELAYED_RELEASE_TABLET | Freq: Every day | ORAL | Status: DC | PRN

## 2015-05-14 MED ORDER — PANTOPRAZOLE SODIUM 40 MG PO TBEC: 40.0000 mg | DELAYED_RELEASE_TABLET | Freq: Every day | ORAL | Status: DC

## 2015-05-14 MED ORDER — ISOSORBIDE MONONITRATE ER 60 MG PO TB24: 60.0000 mg | ORAL_TABLET | Freq: Every day | ORAL | Status: DC

## 2015-05-14 NOTE — Progress Notes (Signed)
Preliminary results by tech  - Left Upper Ext. Venous Duplex Completed. Negative for deep and superficial vein thrombosis in the left arm.  Oda Cogan, BS, RDMS, RVT

## 2015-05-14 NOTE — Telephone Encounter (Signed)
TCM Phone call.Marland Kitchen Appt is on 03/23/17at 10 am w/ Almyra Deforest at the [employer]

## 2015-05-14 NOTE — Consult Note (Signed)
Cardiology Consult    Patient ID: [name] MRN: [id number], DOB/AGE: 10/28/1931   Admit date: 05/10/2015 Date of Consult: 05/14/2015  Primary Physician: [name], MD Primary Cardiologist: Linard Millers, MD Requesting Provider: Derrek Gu, MD  Patient Profile    83 y/oM with hx of CAD s/p CABG w/failed native and SVG but patent LIMA-LAD on plavix and aspirin.  Admitted 3/12 for lower GI bleed.    Past Medical History   Past Medical History  Diagnosis Date  . CAD (coronary artery disease)     a. H/o MI in 1998;  b. s/p CABG 2003. c. 10/2014 NSTEMI/Cath: LM 75, LAD 129m/d, D1 95, D2 50, LCX 100ost/p, OM1 80, OM2 80, RCA 100p/m, RPDA fills via L->L collats, LIMA->LAD ok, VG->dRCA 100, VG->OM1->OM2 99 prox to OM1 insertion->recanalated ->Tx with heparin,  VG->D1 100-->Med Rx.  [place] Hyperlipidemia   . Hypertensive heart disease   . Type II diabetes mellitus ([place])   . History of stomach ulcers 2013  . Arthritis     "touch in my fingers" (09/10/2014)  . B12 deficiency anemia     "stopped taking the shots in ~ 06/2014" (09/10/2014)  . Diabetes 1.5, managed as type 1 ([place])   . CKD (chronic kidney disease), stage IV ([place])   . Chronic systolic CHF (congestive heart failure) (HCC)     a. EF 40-45% in 10/2014 (previously normal in 08/2014).  . Ischemic cardiomyopathy     a. 10/2014 Echo: EF 40-45%.  [place] PVD (peripheral vascular disease) [facility])      Past Surgical History  Procedure Laterality Date  . Esophagogastroduodenoscopy  06/10/2011    Procedure: ESOPHAGOGASTRODUODENOSCOPY (EGD);  Surgeon: Ladene Artist, MD,FACG;  Location: [facility] ENDOSCOPY;  Service: Endoscopy;  Laterality: N/A;  . Coronary artery bypass graft  2003    CABG X5  . Cardiac catheterization  2003;   . Coronary angioplasty  1998    [name] 07/13/2010  . Cataract extraction, bilateral Bilateral   . Cardiac catheterization N/A 11/07/2014    Left Heart Cath; Belva Crome, MD;   . Colonoscopy    . Colonoscopy N/A  05/12/2015    Procedure: COLONOSCOPY;  Surgeon: Milus Banister, MD;  Location: [place];  Service: Endoscopy;  Laterality: N/A;     Allergies  No Known Allergies  History of Present Illness    75 y/oM w/ PMH of DM, CKD stage IV, PVD, B12 def aneemia, PUD (2013), HTN, HLD, ICM, HFrEF,  and CAD s/p CABGx 5 in 2003.    In July 2016, he was admitted with unstable angina found to have high risk MV (Large, severe intensity mostly reversible anterior/anterolateral wall perfusion defect suggestive of ischemia. Moderate sized and intensity fixed inferior defect suggestive of scar. LVEF is 46% with severe inferior hypokinesis to akinesis. This is an abnormal, high risk study) at that time but due to his worsening renal status decided against cardiac catheterization and was unwilling to consider dialysis.  He presented back on 11/06/14 with acute inf STEMI and underwent cardiac cath which showed severe native multivessel and graft dzs including occlusions to the VG  D1, VG  RCA, and a new thrombotic stenosis within the proximal portion of seq VG OM1  OM2.  This was felt to be the culprit lesion but had recanalized.  Given CKD IV, decision was made to forgo PCI and instead, he was left on heparin.  He remained stable and it was ultimately felt that PCI could be performed if  he developed recurrent angina.  He has been medically managed since with plavix and aspirin, in addition to his CCB, BB, statin, nitrate, and lasix.  He reports he has been doing well without chest pain or sob since the fall 2016.  States its been months since he's had to take nitro sl at home.  Mr. Aline Brochure lives alone and functions independently.  He walks daily, around 1 mile at least. His son lives out of town but will be staying with him for several days following this hospitalization.    He was admitted 05/10/15 after 2 days of hematochezia.  On admission, he was found to be anemic and hypotensive.  Underwent colonoscopy on 05/11/15  found to have a presumed left sided diverticular bleed in absence of a pinpoint bleeding source; no intervention was required.  If bleeding continued in the setting of his renal disease, angiogram/embolization would have been considered.  Plavix and lasix was held but aspirin continued since admission.  H/H trend 8.8 on admit, lowest 7.0 and 9.3 on 3/16 (baseline around 10-12)  after total transfusion of 4 units PRBCs.  His hematochezia has resolved. From GI standpoint, cardiology consulted to consider indefinitely stopping plavix or continue holding.    Tropoinin x 3 all 0.13 stable from prior baseline of 0.10 (12/30/14) most likely in the setting of advanced renal disease.  He has had no chest discomfort while admitted. He started having new SOB after laying down which lasted all night.  Shortly after SOB started, he developed a pain in his left upper arm/shoulder that lasted 10-44mins per patient.  ECG @ 2320 unchanged from prior.  Troponin at 1000 today unchanged from prior at 0.13.  RN reports new fine posterior crackles, and new LLE today.  He was given Lasix 40mg  IV and IM to resume his lasix after discharge.  As of today, he is  + 5372ml and 171lbs up from his dry weight -per cardiology office of 166 llbs on 03/24/15.  A left upper extremity doppler study was obtained to r/o possible DVT in the setting of pain negative for DVT.  Inpatient Medications    . sodium chloride   Intravenous Once  . aspirin EC  81 mg Oral QHS  . atorvastatin  80 mg Oral q1800  . carvedilol  25 mg Oral BID WC  . cloNIDine  0.1 mg Oral QHS  . insulin aspart  0-15 Units Subcutaneous TID WC  . insulin aspart  0-5 Units Subcutaneous QHS  . isosorbide mononitrate  60 mg Oral Daily  . latanoprost  1 drop Both Eyes QHS  . pantoprazole  40 mg Oral Daily  . pentoxifylline  400 mg Oral BID  . sodium chloride flush  3 mL Intravenous Q12H  . timolol  1 drop Both Eyes q morning - 10a    Family History    Family History    Problem Relation Age of Onset  . Diabetes      brothers and sisters  . Hypertension Sister   . Coronary artery disease    . Stroke Mother   . Hypertension Mother   . Cancer Mother   . Heart attack Sister     Social History    Social History   Social History  . Marital Status: Widowed    Spouse Name: N/A  . Number of Children: N/A  . Years of Education: N/A   Occupational History  . Not on file.   Social History Main Topics  . Smoking status:  Former Smoker -- 0.50 packs/day for 20 years    Types: Cigarettes  . Smokeless tobacco: Never Used     Comment: "quit smoking cigarettes in the 1970's  . Alcohol Use: No  . Drug Use: No  . Sexual Activity: Not Currently   Other Topics Concern  . Not on file   Social History Narrative     Review of Systems    General:  No chills, fever, night sweats or weight changes.  Cardiovascular:  Mild left upper chest pain yesterday, No dyspnea on exertion, +++edema, +++orthopnea, palpitations, paroxysmal nocturnal dyspnea. Dermatological: No rash, lesions/masses Respiratory: No cough, dyspnea Urologic: No hematuria, dysuria Abdominal:   No nausea, vomiting, diarrhea, +++ bright red blood per rectum, no melena, or hematemesis Neurologic:  No visual changes, wkns, changes in mental status. All other systems reviewed and are otherwise negative except as noted above.  Physical Exam    Blood pressure 136/59, pulse 66, temperature 97.4 F (36.3 C), temperature source Oral, resp. rate 18, height 5\' 11"  (1.803 m), weight 171 lb 4.8 oz (77.7 kg), SpO2 100 %.  General: Pleasant, well nourished adult male in NAD Psych: Normal affect. Neuro: Alert and oriented X 3. Moves all extremities spontaneously. HEENT: Normal  Neck: Supple without bruits or JVD. Lungs:  Resp regular and unlabored, fine posterior bibasilar rale. Heart: RRR no s3, s4. 2/6 syst murmur throughout.  Abdomen: Soft, non-tender, non-distended, BS + x 4.  Extremities: No  clubbing or cyanosis. +1-2 pitting edema BLE. DP/Radials 2+ and equal bilaterally.  Labs     Recent Labs  05/13/15 2251 05/14/15 0619 05/14/15 1000  TROPONINI 0.13* 0.13* 0.13*   Lab Results  Component Value Date   WBC 7.5 05/14/2015   HGB 9.3* 05/14/2015   HCT 27.5* 05/14/2015   MCV 83.1 05/14/2015   PLT 163 05/14/2015     Recent Labs Lab 05/14/15 0619  NA 144  K 4.4  CL 116*  CO2 20*  BUN 23*  CREATININE 2.55*  CALCIUM 8.9  PROT 5.7*  BILITOT 0.5  ALKPHOS 48  ALT 13*  AST 19  GLUCOSE 156*   Lab Results  Component Value Date   CHOL 118 11/07/2014   HDL 37* 11/07/2014   LDLCALC 55 11/07/2014   TRIG 129 11/07/2014     Radiology Studies    Nm Gi Blood Loss  05/11/2015  CLINICAL DATA:  Patient with hematochezia. EXAM: NUCLEAR MEDICINE GASTROINTESTINAL BLEEDING SCAN TECHNIQUE: Sequential abdominal images were obtained following intravenous administration of Tc-75m labeled red blood cells. RADIOPHARMACEUTICALS:  25.3 mCi Tc-59m in-vitro labeled red cells. COMPARISON:  Renal ultrasound 11/14/2014 FINDINGS: Radiotracer is demonstrated coursing within the left lower quadrant towards the rectum, likely emanating from the sigmoid colon. Radiotracer is demonstrated within the blood pool. IMPRESSION: Findings compatible with acute GI bleed, likely emanating from the sigmoid colon. Critical Value/emergent results were called by telephone at the time of interpretation on 05/11/2015 at 12:45 pm to Dr. Azucena Freed , who verbally acknowledged these results. Electronically Signed   By: Lovey Newcomer M.D.   On: 05/11/2015 12:58   [place] upper extremity venous duplex  05/14/15  No evidence of deep vein or superficial thrombosis involving the left upper extremity and right subclavian vein.  ECG & Cardiac Imaging    79, NSR with 1st av block, LAD, LAHB, TWI V6, prolonged QT   Assessment & Plan   1.  Acute Lower GIB:  Pt [facility] with BRBPR and was found  to be  anemic.  He req 4 units PRBC's. ASA/Plavix held in the setting of anemia/bleed.  Seen by GI with colonoscopy on 3/14 with blood throughout the colon, presumed from left sided diverticular bleeding.  H/H stable x 24 hrs.  GI has asked for recs re: use of asa/plavix.  As he did not undergo stenting @ the time of his MI in 10/2014, it would be reasonable to discontinue plavix going forward.  Cont aspirin therapy.  2.  CAD: s/p CABGx 5 (2003) with subsequent STEMI in 10/2014 and finding of 2/5 occluded grafts and severe stenosis w/in the sequential VG  OM1  OM2.  This area recanalized and was treated medically with prolonged heparin infusion.  He has been doing well in the outpt setting w/o recurrent c/p and has also remained active.  He did have mild troponin elevation with a flat trend noted throughout this hospitalization and a brief episode of c/p last night.  Suspect demand ischemia in setting of known multivessel CAD.  Continue Med Rx including asa, statin,  blocker, nitrate.  As above, ok to remain off of plavix in the absence of stenting in Sept.  3.  Acute on chronic combined systolic and diastolic HF-  Presented initially hypovolemic and hypotensive in the setting of acute blood loss secondary to lower GI bleed.  Has since been transfused 4 units PRBC and is +5389ml this admission.  Weight 05/14/15 171lbs up from his dry weight -per cardiology office of 166 llbs on 03/24/15.  He was orthopneic yesterday/last night and was treated with IV lasix this AM. He has mild volume overload on exam currently.  I will give another dose of lasix 40 mg IV x 1 now.  Provided that he can ambulate w/o dyspnea and lie down w/o orthopnea, he may go home today and he should resume his prior home dose of lasix 60 qam and 40 qpm.   Cont  blocker.  No acei/arb/arni/spiro 2/2 CKD IV.  Could consider adding hydralazine as an outpt.  4.  Hypertensive heart disease:  BP stable.  5.  Hyperlipidemia:  Cont statin therapy.  6.  DM  II:  Per IM.  7.  CKD stage IV:  Stable.   Signed, Murray Hodgkins, NP 05/14/2015, 1:29 PM    Agree with note by Ignacia Bayley RNP  Pt of Dr HS with known CAD, CRI, mod LVD admitted with diverticular GIB requiring Tx 4 u PRBCs. We are asked whether we can discontinue plavix. There is no recent stent. Exam benign. Pt was moderately volume over loaded and subsequently diuresed. Appears euvolemic now. OK for DC home off of Plavix. ROV with Dr Tamala Julian.   Lorretta Harp, M.D., [place], [facility], [place] [address] [place], [place]  [address]  [phone] 05/14/2015 3:25 PM

## 2015-05-14 NOTE — Progress Notes (Signed)
PT.c/o left shoulder pain.V/S t=98.4;P=85;R=18;B/P=155/77;O2 sat=100 RA.MD on call Baltazar Najjar was called ;orders received.

## 2015-05-14 NOTE — Progress Notes (Addendum)
Patient complaining of shortness of breath when supine, but while sitting up "feels better". Patient states he takes lasix at home, but medication being held currently. Patient has lower extremity edema and mild crackles noted in bases of lungs. Patient receiving NS at 56ml/hr. Dr. Allyson Sabal paged to make aware. Orders placed.

## 2015-05-14 NOTE — Progress Notes (Signed)
Dr. Allyson Sabal made aware about Billie Lade (PA) recommendation to have cardiology consult patient prior to discharge. Patient also aware.

## 2015-05-14 NOTE — Progress Notes (Addendum)
Daily Rounding Note  05/14/2015, 8:21 AM  LOS: 4 days   SUBJECTIVE:       No stools or BPR.  Normally uses Dulcolax for constipation at home.  Tolerating solid carb mod diet.  Left upper arm pain, doppler study ordered.   OBJECTIVE:         Vital signs in last 24 hours:    Temp:  [98 F (36.7 C)-99 F (37.2 C)] 98 F (36.7 C) (03/16 0518) Pulse Rate:  [61-85] 61 (03/16 0803) Resp:  [18] 18 (03/16 0518) BP: (134-172)/(65-83) 155/70 mmHg (03/16 0803) SpO2:  [94 %-100 %] 99 % (03/16 0803) Weight:  [77.7 kg (171 lb 4.8 oz)] 77.7 kg (171 lb 4.8 oz) (03/16 0518) Last BM Date: 05/12/15 Filed Weights   05/12/15 1154 05/13/15 0637 05/14/15 0518  Weight: 75.297 kg (166 lb) 77.202 kg (170 lb 3.2 oz) 77.7 kg (171 lb 4.8 oz)   General: pleasant, comfortable.  Not ill looking.   Heart: RRR.  Chest: clear bil.   Abdomen: soft, NT, ND.  Active BS  Extremities: no CCE. Neuro/Psych:  Oriented x 3.  Calm.  Fully  Alert.   Intake/Output from previous day: 03/15 0701 - 03/16 0700 In: 927.8 [I.V.:645.8; Blood:282] Out: 200 [Urine:200]  Intake/Output this shift:    Lab Results:  Recent Labs  05/11/15 1755 05/12/15 0300  05/13/15 0416 05/13/15 1405 05/14/15 0619  WBC 6.2 6.2  --   --   --  7.5  HGB 7.9* 7.0*  < > 7.3* 8.1* 9.3*  HCT 25.0* 21.8*  < > 22.4* 25.0* 27.5*  PLT 202 186  --   --   --  163  < > = values in this interval not displayed. BMET  Recent Labs  05/12/15 0300 05/14/15 0619  NA 144 144  K 4.8 4.4  CL 116* 116*  CO2 20* 20*  GLUCOSE 133* 156*  BUN 50* 23*  CREATININE 3.06* 2.55*  CALCIUM 8.6* 8.9   LFT  Recent Labs  05/12/15 0300 05/14/15 0619  PROT 5.3* 5.7*  ALBUMIN 3.0* 3.3*  AST 15 19  ALT 10* 13*  ALKPHOS 43 48  BILITOT 0.6 0.5   PT/INR No results for input(s): LABPROT, INR in the last 72 hours. Hepatitis Panel No results for input(s): HEPBSAG, HCVAB, HEPAIGM, HEPBIGM in the  last 72 hours.  Studies/Results: No results found.   Scheduled Meds: . sodium chloride   Intravenous Once  . aspirin EC  81 mg Oral QHS  . atorvastatin  80 mg Oral q1800  . carvedilol  25 mg Oral BID WC  . cloNIDine  0.1 mg Oral QHS  . insulin aspart  0-15 Units Subcutaneous TID WC  . insulin aspart  0-5 Units Subcutaneous QHS  . isosorbide mononitrate  60 mg Oral Daily  . latanoprost  1 drop Both Eyes QHS  . pantoprazole  40 mg Oral Daily  . pentoxifylline  400 mg Oral BID  . sodium chloride flush  3 mL Intravenous Q12H  . timolol  1 drop Both Eyes q morning - 10a   Continuous Infusions:  PRN Meds:.acetaminophen **OR** acetaminophen, HYDROcodone-acetaminophen, nitroGLYCERIN, ondansetron **OR** ondansetron (ZOFRAN) IV   ASSESMENT:   * Hematochezia. Diverticular bleed. Has resolved for now and has been off Plavix since arrival.  05/11/15 Colonoscopy: fresh blood throughout colon, > on left in region with extensive diverticulae. Despite vigorous lavage, MD unable to pinpoint bleeding source. Presumed left sided  diverticular bleed. IR has signed off.   * ABL anemia. MCV normal. S/p PRBC x 4. Hgb improved. Also has element of anemia of CKD.   * Hx gastric antral ulcer, atrophic gastric mucosa on 05/2011 EGD (for anemia, FOBT +, n/v). Path: benign gastric ulcer, no H Pylori. On oral Protonix.   * Chronic Plavix (on hold), ASA. Indication is CAD (occluded saphenous vein graft) on cath of 10/2014, no hx stents/afib/CVA. EF is 40 to 45%.   * IDDM.   * CKD stage 4. Impedes IR ability to perform angiogram.     PLAN   *  Would obtain cardiology input/consult as to stopping Plavix either for another week or longer or permanently.  Discussed situation with pt and his son.  Son feels most comfortable and confident of decision making if pt seen as inpt by cardiology.  GI follow up prn.  Added prn Dulcolax.     [name]  05/14/2015, 8:21 AM Pager:  [phone]  ________________________________________________________________________  Rudolpho Sevin MD note:  I personally examined the patient, reviewed the data and agree with the assessment and plan described above.  Would not restart any blood thinners until Sunday, after that restart ASA and/or plavix per cardiology input.  It is unlikely he will have rebleeding, but stopping one of the blood thinners if it is not necessary will probably decrease the chances even futher.   Owens Loffler, MD [facility] Pager [phone]

## 2015-05-14 NOTE — Evaluation (Signed)
Physical Therapy Evaluation Patient Details Name: [name] MRN: [id number] DOB: 04/19/1931 Today's Date: 05/14/2015   History of Present Illness  82 yo male with hx of CAD, CKD IV, DM, gastric ulcer, HTN admitted for likely LGIB, with Hb of 7.7 grams per dL.  Patient s/p colonoscopy and multiple transfusions.  Clinical Impression  Patient presents close to functional baseline and safe to d/c home alone.  No further skilled PT intervention needed.  Did educate on gradual return to activities and to take rest breaks as needed for energy conservation.     Follow Up Recommendations No PT follow up    Equipment Recommendations  None recommended by PT    Recommendations for Other Services       Precautions / Restrictions Precautions Precautions: None      Mobility  Bed Mobility               General bed mobility comments: up standing in room upon my entry with son in the room   Transfers Overall transfer level: Independent                  Ambulation/Gait Ambulation/Gait assistance: Independent Ambulation Distance (Feet): 200 Feet Assistive device: None       General Gait Details: able to complete DGI with score not indicative of fall risk  Stairs Stairs: Yes Stairs assistance: Modified independent (Device/Increase time) Stair Management: Alternating pattern;One rail Right Number of Stairs: 5 General stair comments: minimal rail use  Wheelchair Mobility    Modified Rankin (Stroke Patients Only)       Balance                                 Standardized Balance Assessment Standardized Balance Assessment : Dynamic Gait Index   Dynamic Gait Index Level Surface: Normal Change in Gait Speed: Mild Impairment Gait with Horizontal Head Turns: Normal Gait with Vertical Head Turns: Normal Gait and Pivot Turn: Mild Impairment Step Over Obstacle: Normal Step Around Obstacles: Normal Steps: Mild Impairment Total Score: 21        Pertinent Vitals/Pain Pain Assessment: No/denies pain    Home Living Family/patient expects to be discharged to:: Private residence Living Arrangements: Alone Available Help at Discharge: Family;Available PRN/intermittently Type of Home: House Home Access: Level entry     Home Layout: One level Home Equipment: None      Prior Function Level of Independence: Independent               Hand Dominance        Extremity/Trunk Assessment   Upper Extremity Assessment: Overall WFL for tasks assessed           Lower Extremity Assessment: Overall WFL for tasks assessed      Cervical / Trunk Assessment: Other exceptions  Communication   Communication: No difficulties  Cognition Arousal/Alertness: Awake/alert Behavior During Therapy: WFL for tasks assessed/performed Overall Cognitive Status: Within Functional Limits for tasks assessed                      General Comments      Exercises        Assessment/Plan    PT Assessment Patent does not need any further PT services  PT Diagnosis Generalized weakness   PT Problem List    PT Treatment Interventions     PT Goals (Current goals can be found in the Care Plan section)  Acute Rehab PT Goals PT Goal Formulation: All assessment and education complete, DC therapy    Frequency     Barriers to discharge        Co-evaluation               End of Session Equipment Utilized During Treatment: Gait belt Activity Tolerance: Patient tolerated treatment well Patient left: with call bell/phone within reach;in bed;with family/visitor present           Time: 1220-1239 PT Time Calculation (min) (ACUTE ONLY): 19 min   Charges:   PT Evaluation $PT Eval Moderate Complexity: 1 Procedure     PT G CodesReginia Naas 05-27-2015, 1:34 PM  Magda Kiel, [place] 05/27/2015

## 2015-05-14 NOTE — Progress Notes (Signed)
Patient ID: [name], male   DOB: September 07, 1931, 82 y.o.   MRN: [id number]   IR chart checking this pt  Lower GI bleed Off Plavix at this point Conservative management per GI  H/H stable Notes --- still holding on mesenteric arteriogram in IR  For DC soon per chart  Call us if need [place]

## 2015-05-14 NOTE — Progress Notes (Signed)
Baltazar Najjar, NP notified of pt's troponin level at 0.13. Will continue to monitor and treat per MD orders.

## 2015-05-14 NOTE — Discharge Summary (Signed)
Physician Discharge Summary  [name] MRN: [id number] DOB/AGE: 80-Jan-1933 80 y.o.  PCP: [name], MD   Admit date: 05/10/2015 Discharge date: 05/14/2015  Discharge Diagnoses:     Active Problems:   Diabetes mellitus with renal complications (HCC)   Dyslipidemia   Essential hypertension   GI bleed   CKD (chronic kidney disease), stage IV (HCC)   CAD (coronary artery disease) of bypass graft   Acute blood loss anemia   Hypotension   Acute on chronic kidney failure (HCC)   Gastrointestinal bleeding   Type 2 diabetes mellitus with microalbuminuria (HCC)   S/P CABG (coronary artery bypass graft)   Systolic and diastolic CHF, chronic (HCC)   Arm pain    Follow-up recommendations Follow-up with PCP in 3-5 days , including all  additional recommended appointments as below Follow-up CBC, CMP in 3-5 days Patient will need to be monitored closely for recurrent bleeding on his antiplatelet therapy Patient needs to follow-up with cardiology for ongoing management of his antiplatelet regimen     Medication List    STOP taking these medications        clopidogrel 75 MG tablet  Commonly known as:  PLAVIX      TAKE these medications        acetaminophen 500 MG tablet  Commonly known as:  TYLENOL  Take 1,000 mg by mouth every 6 (six) hours as needed for moderate pain.     amLODipine 10 MG tablet  Commonly known as:  NORVASC  TAKE 1 TABLET DAILY     aspirin EC 81 MG tablet  Take 81 mg by mouth at bedtime.     atorvastatin 80 MG tablet  Commonly known as:  LIPITOR  Take 1 tablet (80 mg total) by mouth daily at 6 PM.     carvedilol 25 MG tablet  Commonly known as:  COREG  Take 1 tablet (25 mg total) by mouth 2 (two) times daily with a meal.     cloNIDine 0.1 MG tablet  Commonly known as:  CATAPRES  TAKE 1 TABLET AT BEDTIME     furosemide 40 MG tablet  Commonly known as:  LASIX  Take one-half tablet by mouth in the AM and one tablet by mouth  in the PM     isosorbide mononitrate 60 MG 24 hr tablet  Commonly known as:  IMDUR  Take 1 tablet (60 mg total) by mouth daily.     LANTUS SOLOSTAR 100 UNIT/ML Solostar Pen  Generic drug:  Insulin Glargine  INJECT 14 UNITS INTO THE SKIN DAILY AT 10 PM.     nitroGLYCERIN 0.4 MG SL tablet  Commonly known as:  NITROSTAT  Place 0.4 mg under the tongue every 5 (five) minutes as needed for chest pain (3 doses MAX).     NOVOLOG FLEXPEN 100 UNIT/ML FlexPen  Generic drug:  insulin aspart  Inject 14 Units into the skin 3 (three) times daily.     pantoprazole 40 MG tablet  Commonly known as:  PROTONIX  Take 1 tablet (40 mg total) by mouth daily.     pentoxifylline 400 MG CR tablet  Commonly known as:  TRENTAL  TAKE 1 TABLET TWICE DAILY  AT  10AM  AND  5PM     Potassium Chloride ER 20 MEQ Tbcr  Take 20 mEq by mouth daily.     timolol 0.5 % ophthalmic solution  Commonly known as:  TIMOPTIC  Place 1 drop into both eyes every morning.  TRAVATAN Z 0.004 % Soln ophthalmic solution  Generic drug:  Travoprost (BAK Free)  Place 1 drop into both eyes every evening.         Discharge Condition: Stable  No Known Allergies    Disposition: 01-Home or Self Care   Consults: * Gastroenterology Cardiology    Significant Diagnostic Studies:  Nm Gi Blood Loss  05/11/2015  CLINICAL DATA:  Patient with hematochezia. EXAM: NUCLEAR MEDICINE GASTROINTESTINAL BLEEDING SCAN TECHNIQUE: Sequential abdominal images were obtained following intravenous administration of Tc-55mlabeled red blood cells. RADIOPHARMACEUTICALS:  25.3 mCi Tc-964mn-vitro labeled red cells. COMPARISON:  Renal ultrasound 11/14/2014 FINDINGS: Radiotracer is demonstrated coursing within the left lower quadrant towards the rectum, likely emanating from the sigmoid colon. Radiotracer is demonstrated within the blood pool. IMPRESSION: Findings compatible with acute GI bleed, likely emanating from the sigmoid colon. Critical  Value/emergent results were called by telephone at the time of interpretation on 05/11/2015 at 12:45 pm to Dr. SAAzucena Freed who verbally acknowledged these results. Electronically Signed   By: DrLovey Newcomer.D.   On: 05/11/2015 12:58           Filed Weights   05/12/15 1154 05/13/15 0637 05/14/15 0518  Weight: 75.297 kg (166 lb) 77.202 kg (170 lb 3.2 oz) 77.7 kg (171 lb 4.8 oz)     Microbiology: Recent Results (from the past 240 hour(s))  MRSA PCR Screening     Status: None   Collection Time: 05/10/15  4:21 PM  Result Value Ref Range Status   MRSA by PCR NEGATIVE NEGATIVE Final    Comment:        The GeneXpert MRSA Assay (FDA approved for NASAL specimens only), is one component of a comprehensive MRSA colonization surveillance program. It is not intended to diagnose MRSA infection nor to guide or monitor treatment for MRSA infections.        Blood Culture No results found for: SDES, SPECREQUEST, CULT, REPTSTATUS    Labs: Results for orders placed or performed during the hospital encounter of 05/10/15 (from the past 48 hour(s))  Hemoglobin and hematocrit, blood     Status: Abnormal   Collection Time: 05/12/15  2:46 PM  Result Value Ref Range   Hemoglobin 7.5 (L) 13.0 - 17.0 g/dL   HCT 22.6 (L) 39.0 - 52.0 %  Glucose, capillary     Status: Abnormal   Collection Time: 05/12/15  2:48 PM  Result Value Ref Range   Glucose-Capillary 127 (H) 65 - 99 mg/dL  Prepare RBC     Status: None   Collection Time: 05/12/15  5:00 PM  Result Value Ref Range   Order Confirmation ORDER PROCESSED BY BLOOD BANK   Glucose, capillary     Status: Abnormal   Collection Time: 05/12/15  5:46 PM  Result Value Ref Range   Glucose-Capillary 151 (H) 65 - 99 mg/dL  Hemoglobin and hematocrit, blood     Status: Abnormal   Collection Time: 05/12/15  9:54 PM  Result Value Ref Range   Hemoglobin 8.3 (L) 13.0 - 17.0 g/dL   HCT 25.3 (L) 39.0 - 52.0 %  Glucose, capillary     Status: Abnormal    Collection Time: 05/12/15 10:01 PM  Result Value Ref Range   Glucose-Capillary 124 (H) 65 - 99 mg/dL  Hemoglobin and hematocrit, blood     Status: Abnormal   Collection Time: 05/13/15  4:16 AM  Result Value Ref Range   Hemoglobin 7.3 (L) 13.0 - 17.0 g/dL  HCT 22.4 (L) 39.0 - 52.0 %  Prepare RBC     Status: None   Collection Time: 05/13/15  6:46 AM  Result Value Ref Range   Order Confirmation ORDER PROCESSED BY BLOOD BANK   Glucose, capillary     Status: Abnormal   Collection Time: 05/13/15  7:53 AM  Result Value Ref Range   Glucose-Capillary 169 (H) 65 - 99 mg/dL  Glucose, capillary     Status: Abnormal   Collection Time: 05/13/15 11:56 AM  Result Value Ref Range   Glucose-Capillary 140 (H) 65 - 99 mg/dL  Hemoglobin and hematocrit, blood     Status: Abnormal   Collection Time: 05/13/15  2:05 PM  Result Value Ref Range   Hemoglobin 8.1 (L) 13.0 - 17.0 g/dL   HCT 25.0 (L) 39.0 - 52.0 %  Glucose, capillary     Status: Abnormal   Collection Time: 05/13/15  5:05 PM  Result Value Ref Range   Glucose-Capillary 122 (H) 65 - 99 mg/dL  Glucose, capillary     Status: Abnormal   Collection Time: 05/13/15  9:22 PM  Result Value Ref Range   Glucose-Capillary 131 (H) 65 - 99 mg/dL  Troponin I (q 6hr x 3)     Status: Abnormal   Collection Time: 05/13/15 10:51 PM  Result Value Ref Range   Troponin I 0.13 (H) <0.031 ng/mL    Comment:        PERSISTENTLY INCREASED TROPONIN VALUES IN THE RANGE OF 0.04-0.49 ng/mL CAN BE SEEN IN:       -UNSTABLE ANGINA       -CONGESTIVE HEART FAILURE       -MYOCARDITIS       -CHEST TRAUMA       -ARRYHTHMIAS       -LATE PRESENTING MYOCARDIAL INFARCTION       -COPD   CLINICAL FOLLOW-UP RECOMMENDED.   CBC     Status: Abnormal   Collection Time: 05/14/15  6:19 AM  Result Value Ref Range   WBC 7.5 4.0 - 10.5 K/uL   RBC 3.31 (L) 4.22 - 5.81 MIL/uL   Hemoglobin 9.3 (L) 13.0 - 17.0 g/dL   HCT 27.5 (L) 39.0 - 52.0 %   MCV 83.1 78.0 - 100.0 fL   MCH 28.1  26.0 - 34.0 pg   MCHC 33.8 30.0 - 36.0 g/dL   RDW 17.0 (H) 11.5 - 15.5 %   Platelets 163 150 - 400 K/uL  Comprehensive metabolic panel     Status: Abnormal   Collection Time: 05/14/15  6:19 AM  Result Value Ref Range   Sodium 144 135 - 145 mmol/L   Potassium 4.4 3.5 - 5.1 mmol/L   Chloride 116 (H) 101 - 111 mmol/L   CO2 20 (L) 22 - 32 mmol/L   Glucose, Bld 156 (H) 65 - 99 mg/dL   BUN 23 (H) 6 - 20 mg/dL   Creatinine, Ser 2.55 (H) 0.61 - 1.24 mg/dL   Calcium 8.9 8.9 - 10.3 mg/dL   Total Protein 5.7 (L) 6.5 - 8.1 g/dL   Albumin 3.3 (L) 3.5 - 5.0 g/dL   AST 19 15 - 41 U/L   ALT 13 (L) 17 - 63 U/L   Alkaline Phosphatase 48 38 - 126 U/L   Total Bilirubin 0.5 0.3 - 1.2 mg/dL   GFR calc non Af Amer 22 (L) >60 mL/min   GFR calc Af Amer 26 (L) >60 mL/min    Comment: (NOTE) The eGFR has been  calculated using the CKD EPI equation. This calculation has not been validated in all clinical situations. eGFR's persistently <60 mL/min signify possible Chronic Kidney Disease.    Anion gap 8 5 - 15  Troponin I (q 6hr x 3)     Status: Abnormal   Collection Time: 05/14/15  6:19 AM  Result Value Ref Range   Troponin I 0.13 (H) <0.031 ng/mL    Comment:        PERSISTENTLY INCREASED TROPONIN VALUES IN THE RANGE OF 0.04-0.49 ng/mL CAN BE SEEN IN:       -UNSTABLE ANGINA       -CONGESTIVE HEART FAILURE       -MYOCARDITIS       -CHEST TRAUMA       -ARRYHTHMIAS       -LATE PRESENTING MYOCARDIAL INFARCTION       -COPD   CLINICAL FOLLOW-UP RECOMMENDED.   Glucose, capillary     Status: Abnormal   Collection Time: 05/14/15  7:39 AM  Result Value Ref Range   Glucose-Capillary 138 (H) 65 - 99 mg/dL  Troponin I (q 6hr x 3)     Status: Abnormal   Collection Time: 05/14/15 10:00 AM  Result Value Ref Range   Troponin I 0.13 (H) <0.031 ng/mL    Comment:        PERSISTENTLY INCREASED TROPONIN VALUES IN THE RANGE OF 0.04-0.49 ng/mL CAN BE SEEN IN:       -UNSTABLE ANGINA       -CONGESTIVE HEART  FAILURE       -MYOCARDITIS       -CHEST TRAUMA       -ARRYHTHMIAS       -LATE PRESENTING MYOCARDIAL INFARCTION       -COPD   CLINICAL FOLLOW-UP RECOMMENDED.      Lipid Panel     Component Value Date/Time   CHOL 118 11/07/2014 0358   TRIG 129 11/07/2014 0358   HDL 37* 11/07/2014 0358   CHOLHDL 3.2 11/07/2014 0358   VLDL 26 11/07/2014 0358   LDLCALC 55 11/07/2014 0358   LDLDIRECT 93.3 01/22/2013 0854     Lab Results  Component Value Date   HGBA1C 7.7* 02/16/2015   HGBA1C 6.8* 10/01/2014   HGBA1C 7.6* 07/02/2014     Lab Results  Component Value Date   MICROALBUR 94.9* 03/28/2014   LDLCALC 55 11/07/2014   CREATININE 2.55* 05/14/2015     HPI :82 y.o. BM PMHx CKD stage IV, DM Type 2, Gastric ulcer, HTN, CAD native artery, Ischemic Cardiomyopathy, S/P CABG, Chronic combined Systolic and Diastolic CHF, PVD, B 12 deficiency requiring shots,  Admitted October 2016 with NSTEMI / heart failure. Cath revealed occllded SVG to OM1/OM2 which recanalized with IV heparin. No PCI was done.   Patient came to emergency department today with complaints of bloody stool for last 2 days. No associated abdominal pain. He had nausea without vomiting this morning. No melena. Patient takes a daily aspirin, no other NSAIDs. He is on Plavix.    Assessment and plan Hematochezia - Gastrointestinal bleeding -Nuclear RBC scan suggests bleeding in sigmoid colon region Patient started on Protonix 05/11/15 Colonoscopy: fresh blood throughout colon, > in left side in region with extensive diverticulae. Despite vigorous lavage, MD unable to pinpoint bleeding source. Presumed left sided diverticular bleed.  Status post 4 units of packed red blood cells during this admission Patient's Plavix was held but aspirin was continued  Acute blood loss anemia  Status post 4 units of packed  red blood cells, hemoglobin 9.3 on the day of discharge Hx gastric antral ulcer, atrophic gastric mucosa on 05/2011 EGD  (for anemia, FOBT +, n/v). Path: benign gastric ulcer, no H Pylori.   DM2 with renal complications -December 0093 Hemoglobin A1c =7.7  -Moderate SSI   Acute hypotension/ Hypertension -BP currently well controlled  -Coreg 25 mg BID -Imdur 60 mg daily , dose decreased from 120 mg a day  Acute renal failure superimposed on CKD stage IV(Baseline Cr 2.8-3.5)  - Cr improved after IV fluids, resume Lasix  CAD s/ remote CABG - NSTEMI October 2016 revealing occluded graft troponin flat 0.13 3 likely in the setting of chronic kidney disease, no active chest pain at this time plavix on hold , cont ASA Cardiology consultation obtained prior to discharge to discuss antiplatelet therapy,   Chronic combined systolic and diastolic heart failure EF 40-45% - no evidence of volume overload at present -Transfuse for hemoglobin<8   Dyslipidemia -Lipitor 80 mg daily      Discharge Exam:   Blood pressure 155/70, pulse 61, temperature 98 F (36.7 C), temperature source Oral, resp. rate 18, height '5\' 11"'  (1.803 m), weight 77.7 kg (171 lb 4.8 oz), SpO2 99 %.  General: A/O 4, positive fatigue (acute blood loss), No acute respiratory distress Eyes: Negative headache, negative scleral hemorrhage ENT: Negative Runny nose, negative gingival bleeding, Neck: Negative scars, masses, torticollis, lymphadenopathy, JVD Lungs: Clear to auscultation bilaterally without wheezes or crackles Cardiovascular: Regular rate and rhythm without murmur gallop or rub normal S1 and S2 Abdomen:negative abdominal pain, nondistended, positive soft, bowel sounds, no rebound, no ascites, no appreciable mass Extremities: No significant cyanosis, clubbing, or edema bilateral lower extremities Psychiatric: Negative depression, negative anxiety, negative fatigue, negative mania  Neurologic: Cranial nerves II through XII intact, tongue/uvula midline, all extremities muscle strength 5/5, sensation intact throughout,  negative dysarthria, negative expressive aphasia, negative receptive aphasia.          Follow-up Information    Follow up with [name], MD. Schedule an appointment as soon as possible for a visit in 3 days.   Specialty:  Internal Medicine   Contact information:   [place] [place] [address] [phone]       Follow up with [name], MD. Schedule an appointment as soon as possible for a visit in 1 week.   Specialty:  Cardiology   Why:  Discuss resumption of antiplatelet therapy   Contact information:   [address] [place] [address] [phone]       Signed: Reyne Dumas 05/14/2015, 11:41 AM        Time spent >45 mins

## 2015-05-14 NOTE — Progress Notes (Signed)
[name] to be D/C'd Home per MD order.  Discussed with the patient and all questions fully answered.  VSS, Skin clean, dry and intact without evidence of skin break down, no evidence of skin tears noted. IV catheter discontinued intact. Site without signs and symptoms of complications. Dressing and pressure applied.  An After Visit Summary was printed and given to the patient. Patient received prescription.  D/c education completed with patient/family including follow up instructions, medication list, d/c activities limitations if indicated, with other d/c instructions as indicated by MD - patient able to verbalize understanding, all questions fully answered.   Patient instructed to return to ED, call 911, or call MD for any changes in condition.   Patient to be escorted via [place], and D/C home via private auto.  L'ESPERANCE, Deborra Phegley C 05/14/2015 4:35 PM

## 2015-05-14 NOTE — Telephone Encounter (Signed)
Left message for patient to call back- Rondel Jumbo RN

## 2015-05-14 NOTE — Care Management Note (Addendum)
Case Management Note  Patient Details  Name: [name] MRN: [id number] Date of Birth: 10/11/1931  Subjective/Objective:           Admitted with LGIB, HGB 7.7   82 yo male with hx of CAD, CKD IV, DM, gastric ulcer, HT.   Action/Plan: Plan to d/c to home today after venous doppler. CM to f/u with disposition needs.   Expected Discharge Date:                  Expected Discharge Plan:  Home/Self Care  In-House Referral:     Discharge planning Services  CM Consult  Post Acute Care Choice:    Choice offered to:     DME Arranged:    DME Agency:     HH Arranged:    [place] Agency:     Status of Service:  Completed, signed off  Medicare Important Message Given:   Date Medicare IM Given:    Medicare IM give by:    Date Additional Medicare IM Given:    Additional Medicare Important Message give by:     If discussed at [place] of Stay Meetings, dates discussed:    Additional Comments:  Sharin Mons, [place] [phone] 05/14/2015, 9:50 AM

## 2015-05-15 ENCOUNTER — Telehealth: Payer: Self-pay

## 2015-05-15 NOTE — Telephone Encounter (Signed)
Transition Care Management Follow-up Telephone Call  How have you been since you were released from the hospital? Im doing good    Do you understand why you were in the hospital? yes   Do you understand the discharge instrcutions? yes  Items Reviewed:  Medications reviewed: yes  Allergies reviewed: yes  Dietary changes reviewed: yes  Referrals reviewed: yes   Functional Questionnaire:   Activities of Daily Living (ADLs):   He states they are independent in the following: ambulation, bathing and hygiene, feeding, continence, grooming, toileting and dressing States they require assistance with the following: None   Any transportation issues/concerns?: no   Any patient concerns? no   Confirmed importance and date/time of follow-up visits scheduled: yes   Confirmed with patient if condition begins to worsen call PCP or go to the ER.  Patient was given the Call-a-Nurse line [phone]: yes  Pt has an appt on Monday 05/18/15 with Dr Raliegh Ip but only for 15 min Dr Raliegh Ip doesn't have anything for hospital follow up

## 2015-05-15 NOTE — Telephone Encounter (Signed)
Patient contacted regarding discharge from [facility]   Patient understands to follow up with provider ? 3/28 @ 10 am with Almyra Deforest, PA at [facility] Patient understands discharge instructions? Yes  Patient understands medications and regiment? Yes   Patient understands to bring all medications to this visit? Yes

## 2015-05-17 ENCOUNTER — Observation Stay (HOSPITAL_COMMUNITY)
Admission: EM | Admit: 2015-05-17 | Discharge: 2015-05-21 | Disposition: A | Discharge status: Home / Self Care | Payer: Commercial Managed Care - HMO | Attending: Internal Medicine | Admitting: Internal Medicine

## 2015-05-17 ENCOUNTER — Emergency Department (HOSPITAL_COMMUNITY): Payer: Commercial Managed Care - HMO

## 2015-05-17 ENCOUNTER — Encounter (HOSPITAL_COMMUNITY): Payer: Self-pay | Admitting: *Deleted

## 2015-05-17 DIAGNOSIS — I25810 Atherosclerosis of coronary artery bypass graft(s) without angina pectoris: Secondary | ICD-10-CM | POA: Diagnosis present

## 2015-05-17 DIAGNOSIS — I252 Old myocardial infarction: Secondary | ICD-10-CM | POA: Insufficient documentation

## 2015-05-17 DIAGNOSIS — R1013 Epigastric pain: Secondary | ICD-10-CM | POA: Diagnosis not present

## 2015-05-17 DIAGNOSIS — Z8719 Personal history of other diseases of the digestive system: Secondary | ICD-10-CM | POA: Insufficient documentation

## 2015-05-17 DIAGNOSIS — Z7982 Long term (current) use of aspirin: Secondary | ICD-10-CM | POA: Insufficient documentation

## 2015-05-17 DIAGNOSIS — E785 Hyperlipidemia, unspecified: Secondary | ICD-10-CM | POA: Insufficient documentation

## 2015-05-17 DIAGNOSIS — R945 Abnormal results of liver function studies: Secondary | ICD-10-CM

## 2015-05-17 DIAGNOSIS — I5042 Chronic combined systolic (congestive) and diastolic (congestive) heart failure: Secondary | ICD-10-CM | POA: Diagnosis not present

## 2015-05-17 DIAGNOSIS — IMO0002 Reserved for concepts with insufficient information to code with codable children: Secondary | ICD-10-CM | POA: Diagnosis present

## 2015-05-17 DIAGNOSIS — E1121 Type 2 diabetes mellitus with diabetic nephropathy: Secondary | ICD-10-CM | POA: Diagnosis present

## 2015-05-17 DIAGNOSIS — Z951 Presence of aortocoronary bypass graft: Secondary | ICD-10-CM | POA: Insufficient documentation

## 2015-05-17 DIAGNOSIS — N179 Acute kidney failure, unspecified: Secondary | ICD-10-CM | POA: Diagnosis present

## 2015-05-17 DIAGNOSIS — R079 Chest pain, unspecified: Secondary | ICD-10-CM | POA: Diagnosis not present

## 2015-05-17 DIAGNOSIS — R778 Other specified abnormalities of plasma proteins: Secondary | ICD-10-CM | POA: Diagnosis present

## 2015-05-17 DIAGNOSIS — D631 Anemia in chronic kidney disease: Secondary | ICD-10-CM | POA: Diagnosis not present

## 2015-05-17 DIAGNOSIS — N189 Chronic kidney disease, unspecified: Secondary | ICD-10-CM

## 2015-05-17 DIAGNOSIS — N184 Chronic kidney disease, stage 4 (severe): Secondary | ICD-10-CM | POA: Insufficient documentation

## 2015-05-17 DIAGNOSIS — I5032 Chronic diastolic (congestive) heart failure: Secondary | ICD-10-CM | POA: Insufficient documentation

## 2015-05-17 DIAGNOSIS — Z87891 Personal history of nicotine dependence: Secondary | ICD-10-CM | POA: Diagnosis not present

## 2015-05-17 DIAGNOSIS — I2510 Atherosclerotic heart disease of native coronary artery without angina pectoris: Secondary | ICD-10-CM | POA: Diagnosis present

## 2015-05-17 DIAGNOSIS — R109 Unspecified abdominal pain: Secondary | ICD-10-CM | POA: Insufficient documentation

## 2015-05-17 DIAGNOSIS — E1122 Type 2 diabetes mellitus with diabetic chronic kidney disease: Secondary | ICD-10-CM | POA: Diagnosis not present

## 2015-05-17 DIAGNOSIS — Z8249 Family history of ischemic heart disease and other diseases of the circulatory system: Secondary | ICD-10-CM | POA: Diagnosis not present

## 2015-05-17 DIAGNOSIS — Z794 Long term (current) use of insulin: Secondary | ICD-10-CM | POA: Insufficient documentation

## 2015-05-17 DIAGNOSIS — I10 Essential (primary) hypertension: Secondary | ICD-10-CM

## 2015-05-17 DIAGNOSIS — R600 Localized edema: Secondary | ICD-10-CM

## 2015-05-17 DIAGNOSIS — E119 Type 2 diabetes mellitus without complications: Secondary | ICD-10-CM

## 2015-05-17 DIAGNOSIS — I130 Hypertensive heart and chronic kidney disease with heart failure and stage 1 through stage 4 chronic kidney disease, or unspecified chronic kidney disease: Secondary | ICD-10-CM | POA: Diagnosis not present

## 2015-05-17 DIAGNOSIS — I255 Ischemic cardiomyopathy: Secondary | ICD-10-CM | POA: Diagnosis present

## 2015-05-17 DIAGNOSIS — I25700 Atherosclerosis of coronary artery bypass graft(s), unspecified, with unstable angina pectoris: Secondary | ICD-10-CM | POA: Diagnosis not present

## 2015-05-17 DIAGNOSIS — E1165 Type 2 diabetes mellitus with hyperglycemia: Secondary | ICD-10-CM

## 2015-05-17 DIAGNOSIS — R0602 Shortness of breath: Secondary | ICD-10-CM | POA: Diagnosis not present

## 2015-05-17 DIAGNOSIS — K8020 Calculus of gallbladder without cholecystitis without obstruction: Principal | ICD-10-CM | POA: Insufficient documentation

## 2015-05-17 DIAGNOSIS — R7989 Other specified abnormal findings of blood chemistry: Secondary | ICD-10-CM

## 2015-05-17 DIAGNOSIS — I25709 Atherosclerosis of coronary artery bypass graft(s), unspecified, with unspecified angina pectoris: Secondary | ICD-10-CM | POA: Diagnosis present

## 2015-05-17 DIAGNOSIS — IMO0001 Reserved for inherently not codable concepts without codable children: Secondary | ICD-10-CM | POA: Insufficient documentation

## 2015-05-17 LAB — BASIC METABOLIC PANEL
ANION GAP: 12 (ref 5–15)
BUN: 34 mg/dL — ABNORMAL HIGH (ref 6–20)
CALCIUM: 9.3 mg/dL (ref 8.9–10.3)
CO2: 21 mmol/L — ABNORMAL LOW (ref 22–32)
CREATININE: 3.31 mg/dL — AB (ref 0.61–1.24)
Chloride: 108 mmol/L (ref 101–111)
GFR, EST AFRICAN AMERICAN: 19 mL/min — AB (ref >60)
GFR, EST NON AFRICAN AMERICAN: 16 mL/min — AB (ref >60)
Glucose, Bld: 202 mg/dL — ABNORMAL HIGH (ref 65–99)
Potassium: 4.1 mmol/L (ref 3.5–5.1)
SODIUM: 141 mmol/L (ref 135–145)

## 2015-05-17 LAB — URINE MICROSCOPIC-ADD ON

## 2015-05-17 LAB — PROTIME-INR
INR: 1.18 (ref 0.00–1.49)
PROTHROMBIN TIME: 15.2 s (ref 11.6–15.2)

## 2015-05-17 LAB — CBG MONITORING, ED: Glucose-Capillary: 183 mg/dL — ABNORMAL HIGH (ref 65–99)

## 2015-05-17 LAB — PREPARE RBC (CROSSMATCH)

## 2015-05-17 LAB — CBC
HCT: 26.5 % — ABNORMAL LOW (ref 39.0–52.0)
HEMOGLOBIN: 8.6 g/dL — AB (ref 13.0–17.0)
MCH: 27.6 pg (ref 26.0–34.0)
MCHC: 32.5 g/dL (ref 30.0–36.0)
MCV: 84.9 fL (ref 78.0–100.0)
PLATELETS: 192 10*3/uL (ref 150–400)
RBC: 3.12 MIL/uL — AB (ref 4.22–5.81)
RDW: 17.6 % — ABNORMAL HIGH (ref 11.5–15.5)
WBC: 8.1 10*3/uL (ref 4.0–10.5)

## 2015-05-17 LAB — TROPONIN I
TROPONIN I: 0.09 ng/mL — AB (ref <0.031)
Troponin I: 0.06 ng/mL — ABNORMAL HIGH (ref <0.031)
Troponin I: 0.08 ng/mL — ABNORMAL HIGH (ref <0.031)

## 2015-05-17 LAB — URINALYSIS, ROUTINE W REFLEX MICROSCOPIC
Glucose, UA: NEGATIVE mg/dL
Hgb urine dipstick: NEGATIVE
KETONES UR: NEGATIVE mg/dL
LEUKOCYTES UA: NEGATIVE
NITRITE: NEGATIVE
PH: 5 (ref 5.0–8.0)
PROTEIN: 30 mg/dL — AB
Specific Gravity, Urine: 1.015 (ref 1.005–1.030)

## 2015-05-17 LAB — GLUCOSE, CAPILLARY
GLUCOSE-CAPILLARY: 162 mg/dL — AB (ref 65–99)
Glucose-Capillary: 172 mg/dL — ABNORMAL HIGH (ref 65–99)
Glucose-Capillary: 147 mg/dL — ABNORMAL HIGH (ref 65–99)

## 2015-05-17 LAB — HEMOGLOBIN AND HEMATOCRIT, BLOOD
HCT: 28.4 % — ABNORMAL LOW (ref 39.0–52.0)
HEMATOCRIT: 24.6 % — AB (ref 39.0–52.0)
HEMOGLOBIN: 9.1 g/dL — AB (ref 13.0–17.0)
Hemoglobin: 8 g/dL — ABNORMAL LOW (ref 13.0–17.0)

## 2015-05-17 LAB — RETICULOCYTES
RBC.: 3.35 MIL/uL — AB (ref 4.22–5.81)
RETIC COUNT ABSOLUTE: 70.4 10*3/uL (ref 19.0–186.0)
Retic Ct Pct: 2.1 % (ref 0.4–3.1)

## 2015-05-17 LAB — BRAIN NATRIURETIC PEPTIDE: B NATRIURETIC PEPTIDE 5: 509.4 pg/mL — AB (ref 0.0–100.0)

## 2015-05-17 MED ORDER — ASPIRIN 81 MG PO CHEW
324.0000 mg | CHEWABLE_TABLET | ORAL | Status: AC
  Administered 2015-05-17: 324 mg via ORAL
  Filled 2015-05-17: qty 4

## 2015-05-17 MED ORDER — ACETAMINOPHEN 500 MG PO TABS: 1000.0000 mg | ORAL_TABLET | Freq: Four times a day (QID) | ORAL | Status: DC | PRN

## 2015-05-17 MED ORDER — INSULIN GLARGINE 100 UNIT/ML ~~LOC~~ SOLN
8.0000 [IU] | Freq: Every day | SUBCUTANEOUS | Status: DC
  Administered 2015-05-17 – 2015-05-20 (×4): 8 [IU] via SUBCUTANEOUS
  Filled 2015-05-17 (×4): qty 0.08

## 2015-05-17 MED ORDER — SODIUM CHLORIDE 0.9 % IV SOLN
Freq: Once | INTRAVENOUS | Status: AC
  Administered 2015-05-20: 15:00:00 via INTRAVENOUS

## 2015-05-17 MED ORDER — CLONIDINE HCL 0.1 MG PO TABS
0.1000 mg | ORAL_TABLET | Freq: Every day | ORAL | Status: DC
  Administered 2015-05-17 – 2015-05-20 (×4): 0.1 mg via ORAL
  Filled 2015-05-17 (×4): qty 1

## 2015-05-17 MED ORDER — AMLODIPINE BESYLATE 10 MG PO TABS
10.0000 mg | ORAL_TABLET | Freq: Every day | ORAL | Status: DC
  Administered 2015-05-17 – 2015-05-21 (×5): 10 mg via ORAL
  Filled 2015-05-17 (×5): qty 1

## 2015-05-17 MED ORDER — ZOLPIDEM TARTRATE 5 MG PO TABS
5.0000 mg | ORAL_TABLET | Freq: Once | ORAL | Status: AC
  Administered 2015-05-17: 5 mg via ORAL
  Filled 2015-05-17: qty 1

## 2015-05-17 MED ORDER — ASPIRIN EC 81 MG PO TBEC: 81.0000 mg | DELAYED_RELEASE_TABLET | Freq: Every day | ORAL | Status: DC

## 2015-05-17 MED ORDER — LATANOPROST 0.005 % OP SOLN
1.0000 [drp] | Freq: Every day | OPHTHALMIC | Status: DC
  Administered 2015-05-17 – 2015-05-20 (×4): 1 [drp] via OPHTHALMIC
  Filled 2015-05-17: qty 2.5

## 2015-05-17 MED ORDER — SODIUM CHLORIDE 0.9 % IV SOLN
Freq: Once | INTRAVENOUS | Status: AC
  Administered 2015-05-17: 06:00:00 via INTRAVENOUS

## 2015-05-17 MED ORDER — NITROGLYCERIN 0.4 MG SL SUBL: 0.4000 mg | SUBLINGUAL_TABLET | SUBLINGUAL | Status: DC | PRN

## 2015-05-17 MED ORDER — SODIUM CHLORIDE 0.9 % IV SOLN
250.0000 mL | INTRAVENOUS | Status: DC | PRN
  Administered 2015-05-20: 16:00:00 via INTRAVENOUS

## 2015-05-17 MED ORDER — HYDROCODONE-ACETAMINOPHEN 5-325 MG PO TABS: 1.0000 | ORAL_TABLET | ORAL | Status: DC | PRN

## 2015-05-17 MED ORDER — HEPARIN SODIUM (PORCINE) 5000 UNIT/ML IJ SOLN
5000.0000 [IU] | Freq: Three times a day (TID) | INTRAMUSCULAR | Status: DC
  Administered 2015-05-17: 5000 [IU] via SUBCUTANEOUS
  Filled 2015-05-17: qty 1

## 2015-05-17 MED ORDER — ATORVASTATIN CALCIUM 80 MG PO TABS
80.0000 mg | ORAL_TABLET | Freq: Every day | ORAL | Status: DC
  Administered 2015-05-17: 80 mg via ORAL
  Filled 2015-05-17: qty 1

## 2015-05-17 MED ORDER — ISOSORBIDE MONONITRATE ER 60 MG PO TB24
60.0000 mg | ORAL_TABLET | Freq: Every day | ORAL | Status: DC
  Administered 2015-05-17 – 2015-05-21 (×5): 60 mg via ORAL
  Filled 2015-05-17 (×5): qty 1

## 2015-05-17 MED ORDER — MORPHINE SULFATE (PF) 2 MG/ML IV SOLN
2.0000 mg | Freq: Once | INTRAVENOUS | Status: AC
  Administered 2015-05-17: 2 mg via INTRAVENOUS
  Filled 2015-05-17: qty 1

## 2015-05-17 MED ORDER — PANTOPRAZOLE SODIUM 40 MG PO TBEC: 40.0000 mg | DELAYED_RELEASE_TABLET | Freq: Every day | ORAL | Status: DC

## 2015-05-17 MED ORDER — SODIUM CHLORIDE 0.9% FLUSH
3.0000 mL | INTRAVENOUS | Status: DC | PRN
  Administered 2015-05-20: 3 mL via INTRAVENOUS
  Filled 2015-05-17: qty 3

## 2015-05-17 MED ORDER — ACETAMINOPHEN 325 MG PO TABS: 650.0000 mg | ORAL_TABLET | ORAL | Status: DC | PRN

## 2015-05-17 MED ORDER — ONDANSETRON HCL 4 MG/2ML IJ SOLN: 4.0000 mg | Freq: Four times a day (QID) | INTRAMUSCULAR | Status: DC | PRN

## 2015-05-17 MED ORDER — PENTOXIFYLLINE ER 400 MG PO TBCR
400.0000 mg | EXTENDED_RELEASE_TABLET | Freq: Two times a day (BID) | ORAL | Status: DC
  Administered 2015-05-17 – 2015-05-21 (×7): 400 mg via ORAL
  Filled 2015-05-17 (×15): qty 1

## 2015-05-17 MED ORDER — ACETAMINOPHEN 325 MG PO TABS: 650.0000 mg | ORAL_TABLET | Freq: Four times a day (QID) | ORAL | Status: DC | PRN

## 2015-05-17 MED ORDER — PANTOPRAZOLE SODIUM 40 MG PO TBEC
40.0000 mg | DELAYED_RELEASE_TABLET | Freq: Two times a day (BID) | ORAL | Status: DC
  Administered 2015-05-17 – 2015-05-21 (×8): 40 mg via ORAL
  Filled 2015-05-17 (×8): qty 1

## 2015-05-17 MED ORDER — FUROSEMIDE 10 MG/ML IJ SOLN
40.0000 mg | Freq: Once | INTRAMUSCULAR | Status: AC
  Administered 2015-05-17: 40 mg via INTRAVENOUS
  Filled 2015-05-17: qty 4

## 2015-05-17 MED ORDER — TIMOLOL MALEATE 0.5 % OP SOLN
1.0000 [drp] | Freq: Every morning | OPHTHALMIC | Status: DC
  Administered 2015-05-17 – 2015-05-21 (×4): 1 [drp] via OPHTHALMIC
  Filled 2015-05-17: qty 5

## 2015-05-17 MED ORDER — CARVEDILOL 25 MG PO TABS
25.0000 mg | ORAL_TABLET | Freq: Two times a day (BID) | ORAL | Status: DC
  Administered 2015-05-17 – 2015-05-21 (×7): 25 mg via ORAL
  Filled 2015-05-17 (×7): qty 1

## 2015-05-17 MED ORDER — INSULIN ASPART 100 UNIT/ML ~~LOC~~ SOLN
0.0000 [IU] | Freq: Three times a day (TID) | SUBCUTANEOUS | Status: DC
  Administered 2015-05-17 (×2): 3 [IU] via SUBCUTANEOUS
  Administered 2015-05-18: 2 [IU] via SUBCUTANEOUS
  Administered 2015-05-19 – 2015-05-21 (×2): 5 [IU] via SUBCUTANEOUS

## 2015-05-17 MED ORDER — SODIUM CHLORIDE 0.9% FLUSH
3.0000 mL | Freq: Two times a day (BID) | INTRAVENOUS | Status: DC
  Administered 2015-05-17 – 2015-05-21 (×7): 3 mL via INTRAVENOUS

## 2015-05-17 MED ORDER — PANTOPRAZOLE SODIUM 40 MG IV SOLR
40.0000 mg | Freq: Once | INTRAVENOUS | Status: AC
  Administered 2015-05-17: 40 mg via INTRAVENOUS
  Filled 2015-05-17: qty 40

## 2015-05-17 MED ORDER — ASPIRIN 300 MG RE SUPP: 300.0000 mg | RECTAL | Status: AC

## 2015-05-17 NOTE — H&P (Signed)
[facility] History and Physical  [name] [id number] DOB: [birth year] DOA: 05/17/2015  Referring physician: ED physician PCP: [name], MD  Specialists: Dr. Tamala Julian (cardiology), Dr. Deatra Ina (GI)   Chief Complaint:  Lower chest/epigastric pain   HPI: [name] is a 82 y.o. male with PMH of gastric ulcers, lower GI bleed, CAD status post CABG and restenosis, ischemic cardiomyopathy, insulin-dependent diabetes mellitus, and chronic kidney disease stage IV who presents to the ED with 12 hours of constant pain in the lower chest and epigastrium 3 days after being discharged from this institution. Patient was admitted from 05/10/2015 - 05/14/2015 with lower GI bleed requiring transfusion. He was discharged home in much improved and stable condition, and initially did quite well. At approximately 4 PM on 05/16/2015, patient developed insidious onset of epigastric and lower chest pain which she describes as severe, constant, nonradiating, and without appreciable alleviating or exacerbating factors. Patient has never had similar pain previously. He denies any fevers, chills, rhinorrhea, or sore throat. He denies melena or hematochezia. Also denies nausea or vomiting. He has continued his prescribed medications without incident.   In ED, patient was found to be afebrile, saturating well on room air, and with vital signs stable. EKG revealed a sinus rhythm with first degree AV nodal block, LAD, and nonspecific T-wave abnormality. QTc is prolonged at 460 ms. Chest x-ray features unchanged heart irregularly in no acute cardiopulmonary disease. Initial blood work is notable for serum creatinine of 3.31, up from 2.55 at time of discharge 3 days prior. Also notable is troponin is 0.09, consistent with his apparent baseline, and a BNP elevated to a value of 509. Patient has gained 2 pounds since discharge 3 days ago. Hemoglobin is 8.6, down from 9.4 and it recent discharge.  Given the patient's severe underlying cardiac disease, and in light of his anemia with recent GI bleed, there is concern that his atypical symptoms may represent something ominous. Specifically, there is concern that he may be rebleeding and is at very high risk for cardiac ischemia. Will be admitted to stepdown unit for ongoing evaluation and management of atypical chest pain in patient with severe underlying heart disease and worsening anemia.  Where does patient live?   At home     Can patient participate in ADLs?  Yes        Review of Systems:   General: no fevers, chills, sweats, weight change, poor appetite, or fatigue HEENT: no blurry vision, hearing changes or sore throat Pulm: no wheeze. SOB, non-productive cough CV: no palpitations. Lower chest pain Abd: no nausea, vomiting, diarrhea, or constipation. Epigastric pain.  GU: no dysuria, hematuria, increased urinary frequency, or urgency  Ext: Chronic b/l LE edema Neuro: no focal weakness, numbness, or tingling, no vision change or hearing loss Skin: no rash, no wounds MSK: No muscle spasm, no deformity, no red, hot, or swollen joint Heme: No easy bruising or bleeding Travel history: No recent long distant travel    Allergy: No Known Allergies  Past Medical History  Diagnosis Date  . CAD (coronary artery disease)     a. H/o MI in 1998;  b. s/p CABG 2003. c. 10/2014 NSTEMI/Cath: LM 75, LAD 18m/d, D1 95, D2 50, LCX 100ost/p, OM1 80, OM2 80, RCA 100p/m, RPDA fills via L->L collats, LIMA->LAD ok, VG->dRCA 100, VG->OM1->OM2 99 prox to OM1 insertion->recanalated ->Tx with heparin,  VG->D1 100-->Med Rx.  Marland Kitchen Hyperlipidemia   . Hypertensive heart disease   . Type II diabetes mellitus (  [place])   . History of stomach ulcers 2013  . Arthritis     "touch in my fingers" (09/10/2014)  . B12 deficiency anemia     "stopped taking the shots in ~ 06/2014" (09/10/2014)  . Diabetes 1.5, managed as type 1 ([place])   . CKD (chronic kidney disease), stage IV  ([place])   . Chronic systolic CHF (congestive heart failure) (HCC)     a. EF 40-45% in 10/2014 (previously normal in 08/2014).  . Ischemic cardiomyopathy     a. 10/2014 Echo: EF 40-45%.  Marland Kitchen PVD (peripheral vascular disease) [facility])     Past Surgical History  Procedure Laterality Date  . Esophagogastroduodenoscopy  06/10/2011    Procedure: ESOPHAGOGASTRODUODENOSCOPY (EGD);  Surgeon: Ladene Artist, MD,FACG;  Location: [facility] ENDOSCOPY;  Service: Endoscopy;  Laterality: N/A;  . Coronary artery bypass graft  2003    CABG X5  . Cardiac catheterization  2003;   . Coronary angioplasty  1998    [name] 07/13/2010  . Cataract extraction, bilateral Bilateral   . Cardiac catheterization N/A 11/07/2014    Left Heart Cath; Belva Crome, MD;   . Colonoscopy    . Colonoscopy N/A 05/12/2015    Procedure: COLONOSCOPY;  Surgeon: Milus Banister, MD;  Location: [place];  Service: Endoscopy;  Laterality: N/A;    Social History:  reports that he has quit smoking. His smoking use included Cigarettes. He has a 10 pack-year smoking history. He has never used smokeless tobacco. He reports that he does not drink alcohol or use illicit drugs.  Family History:  Family History  Problem Relation Age of Onset  . Diabetes      brothers and sisters  . Hypertension Sister   . Coronary artery disease    . Stroke Mother   . Hypertension Mother   . Cancer Mother   . Heart attack Sister      Prior to Admission medications   Medication Sig Start Date End Date Taking? Authorizing Provider  acetaminophen (TYLENOL) 500 MG tablet Take 1,000 mg by mouth every 6 (six) hours as needed for moderate pain.   Yes Historical Provider, MD  amLODipine (NORVASC) 10 MG tablet TAKE 1 TABLET DAILY 02/18/15  Yes Marletta Lor, MD  aspirin EC 81 MG tablet Take 81 mg by mouth at bedtime.   Yes Historical Provider, MD  atorvastatin (LIPITOR) 80 MG tablet Take 1 tablet (80 mg total) by mouth daily at 6 PM. 11/12/14  Yes Almyra Deforest, PA   carvedilol (COREG) 25 MG tablet Take 1 tablet (25 mg total) by mouth 2 (two) times daily with a meal. 01/01/15  Yes Almyra Deforest, PA  cloNIDine (CATAPRES) 0.1 MG tablet TAKE 1 TABLET AT BEDTIME 04/06/15  Yes Marletta Lor, MD  furosemide (LASIX) 40 MG tablet Take one-half tablet by mouth in the AM and one tablet by mouth in the PM 04/02/15  Yes Belva Crome, MD  isosorbide mononitrate (IMDUR) 60 MG 24 hr tablet Take 1 tablet (60 mg total) by mouth daily. 05/14/15  Yes Reyne Dumas, MD  LANTUS SOLOSTAR 100 UNIT/ML Solostar Pen INJECT 14 UNITS INTO THE SKIN DAILY AT 10 PM. 04/02/15  Yes Marletta Lor, MD  nitroGLYCERIN (NITROSTAT) 0.4 MG SL tablet Place 0.4 mg under the tongue every 5 (five) minutes as needed for chest pain (3 doses MAX).   Yes Historical Provider, MD  NOVOLOG FLEXPEN 100 UNIT/ML FlexPen Inject 14 Units into the skin 3 (three) times daily. 03/03/15  Yes Historical Provider, MD  pantoprazole (PROTONIX) 40 MG tablet Take 1 tablet (40 mg total) by mouth daily. 05/14/15  Yes Reyne Dumas, MD  pentoxifylline (TRENTAL) 400 MG CR tablet TAKE 1 TABLET TWICE DAILY  AT  10AM  AND  5PM 04/02/15  Yes Marletta Lor, MD  Potassium Chloride ER 20 MEQ TBCR Take 20 mEq by mouth daily. 04/27/15  Yes Marletta Lor, MD  timolol (TIMOPTIC) 0.5 % ophthalmic solution Place 1 drop into both eyes every morning. 04/27/15  Yes Historical Provider, MD  TRAVATAN Z 0.004 % SOLN ophthalmic solution Place 1 drop into both eyes every evening. 04/27/15  Yes Historical Provider, MD    Physical Exam: Filed Vitals:   05/17/15 0300 05/17/15 0330 05/17/15 0430 05/17/15 0500  BP: 134/70 139/68 126/58 135/69  Pulse: 75 76 70 73  Temp:      Resp: 13 16 16 15   Height:      Weight:      SpO2: 98% 97% 100% 98%   General: Not in acute distress, appears chronically-ill HEENT:       Eyes: PERRL, EOMI, no scleral icterus or conjunctival pallor.       ENT: No discharge from the ears or nose, no pharyngeal ulcers        Neck: No JVD, no bruit, no appreciable mass Heme: No cervical adenopathy, no pallor Cardiac: S1/S2, RRR, grade III holosystolic murmur throughout precordium. No gallops or rubs. Pulm: Good air movement bilaterally. No rales, wheezing, rhonchi or rubs. Abd: Soft, nondistended, mild epigastric tenderness, no rebound pain or gaurding, BS present. Ext: 1+ pitting edema b/l. 2+DP/PT pulse bilaterally. Musculoskeletal: No gross deformity, no red, hot, swollen joints   Skin: No rashes or wounds on exposed surfaces  Neuro: Alert, oriented X3, cranial nerves II-XII grossly intact. No focal findings Psych: Patient is not overtly psychotic, appropriate mood and affect.  Labs on Admission:  Basic Metabolic Panel:  Recent Labs Lab 05/10/15 1032 05/11/15 0702 05/12/15 0300 05/14/15 0619 05/17/15 0224  NA 138 142 144 144 141  K 4.4 4.9 4.8 4.4 4.1  CL 105 113* 116* 116* 108  CO2 19* 21* 20* 20* 21*  GLUCOSE 136* 121* 133* 156* 202*  BUN 69* 57* 50* 23* 34*  CREATININE 4.40* 3.38* 3.06* 2.55* 3.31*  CALCIUM 9.1 8.9 8.6* 8.9 9.3   Liver Function Tests:  Recent Labs Lab 05/10/15 1032 05/12/15 0300 05/14/15 0619  AST 16 15 19   ALT 12* 10* 13*  ALKPHOS 63 43 48  BILITOT 0.5 0.6 0.5  PROT 6.8 5.3* 5.7*  ALBUMIN 3.7 3.0* 3.3*   No results for input(s): LIPASE, AMYLASE in the last 168 hours. No results for input(s): AMMONIA in the last 168 hours. CBC:  Recent Labs Lab 05/11/15 0702 05/11/15 1755 05/12/15 0300  05/12/15 2154 05/13/15 0416 05/13/15 1405 05/14/15 0619 05/17/15 0224  WBC 5.9 6.2 6.2  --   --   --   --  7.5 8.1  HGB 8.6* 7.9* 7.0*  < > 8.3* 7.3* 8.1* 9.3* 8.6*  HCT 27.0* 25.0* 21.8*  < > 25.3* 22.4* 25.0* 27.5* 26.5*  MCV 84.4 83.9 85.2  --   --   --   --  83.1 84.9  PLT 193 202 186  --   --   --   --  163 192  < > = values in this interval not displayed. Cardiac Enzymes:  Recent Labs Lab 05/13/15 2251 05/14/15 0619 05/14/15 1000 05/17/15 0224  TROPONINI 0.13* 0.13* 0.13* 0.09*    BNP (last 3 results)  Recent Labs  12/29/14 1140 05/17/15 0224  BNP 1114.2* 509.4*    ProBNP (last 3 results) No results for input(s): PROBNP in the last 8760 hours.  CBG:  Recent Labs Lab 05/13/15 1156 05/13/15 1705 05/13/15 2122 05/14/15 0739 05/14/15 1314  GLUCAP 140* 122* 131* 138* 136*    Radiological Exams on Admission: Dg Chest 2 View  05/17/2015  CLINICAL DATA:  Chest abdominal pain all day. Recent discharge from the hospital for GI bleed. EXAM: CHEST  2 VIEW COMPARISON:  12/29/2014. FINDINGS: Unchanged linear basilar opacities, likely due to scarring. The lungs are otherwise clear. There is no pleural effusion. Hilar and mediastinal contours are unremarkable and unchanged. Borderline cardiomegaly, unchanged. There is prior sternotomy and CABG. IMPRESSION: Unchanged cardiomegaly. Linear basilar scarring. No acute cardiopulmonary findings. Electronically Signed   By: Andreas Newport M.D.   On: 05/17/2015 02:56    EKG: Independently reviewed.  Abnormal findings:  Sinus, first degree AVN block, LAD, non-specific T-wave abnormality   Assessment/Plan  1. Chest pain - Sxs are atypical but pt with severe underlying cardiac dz  - Initial trop is 0.09, but this may actually be his baseline per chart review  - EKG without convincing features for acute ischemia  - ASA 324 given, continue ASA 81 qD, beta-blockade, high-intensity statin; Plavix stopped after bleed last week   - Admit to stepdown unit and monitor for ischemic change  - Obtain serial trops, EKG  - Keep O2 sat high 90s while anemic  2. Anemia  - Secondary to chronic kidney disease  - Hgb 8.6 on arrival, down from 9.3 three days prior  - No evidence of active blood loss, but with recent bleed, hx of gastric ulcer, and epigastric pain, there is concern - Check FOBTs  - Protonix IVP given, continue daily  - With concern for ischemia, 1 unit pRBC will be ordered for  immediate transfusion  - Check post-transfusion H/H, RN to order    3. CAD  - Remote MI, CABG, re-stenosis and subsequent MI, recanulized with heparin  - Plavix stopped for GIB last admit  - Continue ASA 81, high-intensity statin, beta-blocker, Imdur; hold ACE/ARB until renal fxn stabilizes  - Managing CP as above   4. Insulin-dependent DM  - A1c 7.7% in December '16, suggesting sub-optimal control at that time  - Using 14 units Lantus nightly and short-acting TID with meals at home  - Start with 8 units Lantus here for basal coverage with moderate-intensity SSI  - Check CBG with meals and qHS and adjust regimen prn  - Update A1c, pending  - Carb-modified diet when appropriate    5. Chronic systolic and diastolic CHF  - TTE (A999333) EF 40-45%, mild MR, akinetic inferior aspect  - BNP 509 on admission  - Hesitant to diurese at this time with concern for GIB  - SLIV, strict I/Os, daily wts, fluid-restrict diet    6. AKI on CKD IV   - SCr 3.31, up from 2.55 at time of d/c 3 days prior  - Uncertain etio, may need to slow diuresis  - Avoiding nephrotoxins where feasible   - Trend     DVT ppx: SQ Heparin     Code Status: Full code Family Communication:  Yes, patient's son at bed side Disposition Plan: Admit to inpatient   Date of Service 05/17/2015    Vianne Bulls, MD [facility] Pager [phone]  If  7PM-7AM, please contact night-coverage [email or web address] Password [facility] 05/17/2015, 5:38 AM

## 2015-05-17 NOTE — Progress Notes (Signed)
[facility] PROGRESS NOTE  [name] [id number] DOB: 1931-08-14 DOA: 05/17/2015 PCP: [name], MD Admit HPI / Brief Narrative: 82 y.o. BM PMHx  Gastric ulcers, lower GI bleed, CAD S/P  CABG and Restenosis, Ischemic Cardiomyopathy, DM Type 2 insulin-dependent, CKD stage IV   who presents to the ED with 12 hours of constant pain in the lower chest and epigastrium 3 days after being discharged from this institution. Patient was admitted from 05/10/2015 - 05/14/2015 with lower GI bleed requiring transfusion. He was discharged home in much improved and stable condition, and initially did quite well. At approximately 4 PM on 05/16/2015, patient developed insidious onset of epigastric and lower chest pain which she describes as severe, constant, nonradiating, and without appreciable alleviating or exacerbating factors. Patient has never had similar pain previously. He denies any fevers, chills, rhinorrhea, or sore throat. He denies melena or hematochezia. Also denies nausea or vomiting. He has continued his prescribed medications without incident.   In ED, patient was found to be afebrile, saturating well on room air, and with vital signs stable. EKG revealed a sinus rhythm with first degree AV nodal block, LAD, and nonspecific T-wave abnormality. QTc is prolonged at 460 ms. Chest x-ray features unchanged heart irregularly in no acute cardiopulmonary disease. Initial blood work is notable for serum creatinine of 3.31, up from 2.55 at time of discharge 3 days prior. Also notable is troponin is 0.09, consistent with his apparent baseline, and a BNP elevated to a value of 509. Patient has gained 2 pounds since discharge 3 days ago. Hemoglobin is 8.6, down from 9.4 and it recent discharge. Given the patient's severe underlying cardiac disease, and in light of his anemia with recent GI bleed, there is concern that his atypical symptoms may represent something ominous. Specifically,  there is concern that he may be rebleeding and is at very high risk for cardiac ischemia. Will be admitted to stepdown unit for ongoing evaluation and management of atypical chest pain in patient with severe underlying heart disease and worsening anemia.  HPI/Subjective: 3/19  A/O 4, patient frustrated that he had to be readmitted so quickly. Does not know what his base weight should be nor does he weigh himself daily. From discussion with patient and family does not appear that requirement for daily weights and what to do with those weights was clearly articulated prior to discharge.     Assessment/Plan: Chest pain - Resolved, patient states negative chest pain was RUQ abdominal pain which has resolved.  - Initial trop is 0.09, most likely baseline given his cardiac history and renal failure  - EKG without convincing features for acute ischemia  - Hold all anticoagulants until determined if patient has true rebleed (aspirin, Plavix, heparin) ASA 324 given, continue - Obtain serial trops; slightly elevated but lower than previous admission   - Titrate O2 to maintain SPO2 > 93%   Acute on Chronic Anemia  - Most likely multifactorial to include CKD, slow blood loss from diverticulosis flow  - Hgb 8.6 on arrival, down from 9.3 three days prior  -Anemia panel pending - No evidence of active blood loss, but with recent bleed, hx of gastric ulcer, and epigastric pain, there is concern - Check FOBTs daily - Increase Protonix 40 mg BID  - 3/19 transfuse 1 unit PRBC - Posttransfusion Lasix 40 mg 1 dose    CAD native artery  - Remote MI, CABG, re-stenosis and subsequent MI, recanulized with heparin  - Plavix stopped for GIB  last admit  - Hold Continue ASA 81 -Lipitor 80 mg daily -Coreg 25 mg BID - Imdur 60 mg daily    DM Type 2 uncontrolled renal complications - 99991111 Hemoglobin A1c = 7.7%  - Lantus 8 units daily  -Moderate SSI  - Update A1c, pending  - Heart  healthy/Carb-modified diet when appropriate   Chronic systolic and diastolic CHF/ischemic cardiomyopathy  - TTE (11/07/2014) EF 40-45%, mild MR, akinetic inferior aspect  - BNP 509 on admission  - Hesitant to diurese at this time with concern for GIB  - Strict in and out  -Daily weight  -Transfuse for hemoglobin <8  -3/19 transfused 1 unit PRBC  Acute on chronic renal failure (Cr baseline~ 2.5- 3.3) - SCr 3.31, up from 2.55 at time of d/c 3 days prior  - Uncertain etio, may need to slow diuresis  - Avoiding nephrotoxins where feasible  -hold ACE/ARB until renal fxn stabilizes   Pedal edema -Most likely multifactorial to include ischemic cardiomyopathy and acute on chronic renal failure -TED hose -See chronic systolic and diastolic CHF   Code Status: Full Family Communication: Son and grandchildren Disposition Plan: Resolution GI bleed   Consultants: NA   Procedures: 3/14 colonoscopy: Blood throughout the colon, presumed from left sided diverticular bleeding (based on this exam and also yesterdays nuc med bleeding scan). The exact site was never located  Cultures NA  Antibiotics: NA   DVT prophylaxis SCD    Objective: Filed Vitals:   05/17/15 1138 05/17/15 1143 05/17/15 1710 05/17/15 1845  BP:  123/52 126/57 121/51  Pulse:  67 80 73  Temp:  98.6 F (37 C) 98.5 F (36.9 C) 98.9 F (37.2 C)  TempSrc:  Oral Oral Oral  Resp:  18 18 18   Height: 5\' 11"  (1.803 m)     Weight: 77.747 kg (171 lb 6.4 oz)     SpO2:  99% 99% 94%    Intake/Output Summary (Last 24 hours) at 05/17/15 1901 Last data filed at 05/17/15 1845  Gross per 24 hour  Intake    370 ml  Output      0 ml  Net    370 ml   Filed Weights   05/17/15 0218 05/17/15 1138  Weight: 78.075 kg (172 lb 2 oz) 77.747 kg (171 lb 6.4 oz)     Exam: General: A/O 4, NAD, No acute respiratory distress Eyes: Negative headache, double vision,negative scleral hemorrhage ENT: Negative Runny nose,  negative gingival bleeding, Neck:  Negative scars, masses, torticollis, lymphadenopathy, JVD Lungs: Clear to auscultation bilaterally without wheezes or crackles Cardiovascular: Regular rate and rhythm without murmur gallop or rub normal S1 and S2 Abdomen:negative abdominal pain, negative dysphagia, nondistended, positive soft, bowel sounds, no rebound, no ascites, no appreciable mass Extremities: No significant cyanosis, clubbing. Bilateral lower extremity edema 2+ Lt>>Rt Psychiatric:  Negative depression, negative anxiety, negative fatigue, negative mania  Neurologic:  Cranial nerves II through XII intact, tongue/uvula midline, all extremities muscle strength 5/5, sensation intact throughout, negative dysarthria, negative expressive aphasia, negative receptive aphasia.     Data Reviewed: Basic Metabolic Panel:  Recent Labs Lab 05/11/15 0702 05/12/15 0300 05/14/15 0619 05/17/15 0224  NA 142 144 144 141  K 4.9 4.8 4.4 4.1  CL 113* 116* 116* 108  CO2 21* 20* 20* 21*  GLUCOSE 121* 133* 156* 202*  BUN 57* 50* 23* 34*  CREATININE 3.38* 3.06* 2.55* 3.31*  CALCIUM 8.9 8.6* 8.9 9.3   Liver Function Tests:  Recent Labs Lab 05/12/15  0300 05/14/15 0619  AST 15 19  ALT 10* 13*  ALKPHOS 43 48  BILITOT 0.6 0.5  PROT 5.3* 5.7*  ALBUMIN 3.0* 3.3*   No results for input(s): LIPASE, AMYLASE in the last 168 hours. No results for input(s): AMMONIA in the last 168 hours. CBC:  Recent Labs Lab 05/11/15 0702 05/11/15 1755 05/12/15 0300  05/13/15 0416 05/13/15 1405 05/14/15 0619 05/17/15 0224 05/17/15 1236  WBC 5.9 6.2 6.2  --   --   --  7.5 8.1  --   HGB 8.6* 7.9* 7.0*  < > 7.3* 8.1* 9.3* 8.6* 8.0*  HCT 27.0* 25.0* 21.8*  < > 22.4* 25.0* 27.5* 26.5* 24.6*  MCV 84.4 83.9 85.2  --   --   --  83.1 84.9  --   PLT 193 202 186  --   --   --  163 192  --   < > = values in this interval not displayed. Cardiac Enzymes:  Recent Labs Lab 05/14/15 0619 05/14/15 1000 05/17/15 0224  05/17/15 0705 05/17/15 1535  TROPONINI 0.13* 0.13* 0.09* 0.06* 0.08*   BNP (last 3 results)  Recent Labs  12/29/14 1140 05/17/15 0224  BNP 1114.2* 509.4*    ProBNP (last 3 results) No results for input(s): PROBNP in the last 8760 hours.  CBG:  Recent Labs Lab 05/14/15 0739 05/14/15 1314 05/17/15 0817 05/17/15 1140 05/17/15 1653  GLUCAP 138* 136* 183* 172* 162*    Recent Results (from the past 240 hour(s))  MRSA PCR Screening     Status: None   Collection Time: 05/10/15  4:21 PM  Result Value Ref Range Status   MRSA by PCR NEGATIVE NEGATIVE Final    Comment:        The GeneXpert MRSA Assay (FDA approved for NASAL specimens only), is one component of a comprehensive MRSA colonization surveillance program. It is not intended to diagnose MRSA infection nor to guide or monitor treatment for MRSA infections.      Studies: Dg Chest 2 View  05/17/2015  CLINICAL DATA:  Chest abdominal pain all day. Recent discharge from the hospital for GI bleed. EXAM: CHEST  2 VIEW COMPARISON:  12/29/2014. FINDINGS: Unchanged linear basilar opacities, likely due to scarring. The lungs are otherwise clear. There is no pleural effusion. Hilar and mediastinal contours are unremarkable and unchanged. Borderline cardiomegaly, unchanged. There is prior sternotomy and CABG. IMPRESSION: Unchanged cardiomegaly. Linear basilar scarring. No acute cardiopulmonary findings. Electronically Signed   By: Andreas Newport M.D.   On: 05/17/2015 02:56    Scheduled Meds: . sodium chloride   Intravenous Once  . amLODipine  10 mg Oral Daily  . atorvastatin  80 mg Oral q1800  . carvedilol  25 mg Oral BID WC  . cloNIDine  0.1 mg Oral QHS  . furosemide  40 mg Intravenous Once  . insulin aspart  0-15 Units Subcutaneous TID WC  . insulin glargine  8 Units Subcutaneous QHS  . isosorbide mononitrate  60 mg Oral Daily  . latanoprost  1 drop Both Eyes QHS  . pantoprazole  40 mg Oral BID  . pentoxifylline   400 mg Oral BID  . sodium chloride flush  3 mL Intravenous Q12H  . timolol  1 drop Both Eyes q morning - 10a   Continuous Infusions:   Principal Problem:   Chest pain Active Problems:   Diabetes mellitus with renal complications (HCC)   Essential hypertension   Coronary atherosclerosis of autologous vein bypass graft  Elevated troponin   CAD (coronary artery disease) of bypass graft   Systolic and diastolic CHF, chronic (HCC)   Epigastric pain   Pain in the chest   CAD in native artery   Uncontrolled type 2 diabetes mellitus with diabetic nephropathy (HCC)   Cardiomyopathy, ischemic   Acute on chronic renal failure (HCC)   Pedal edema    Time spent: 59min    Kendria Halberg, [place] [facility] Pager [phone]. If 7PM-7AM, please contact night-coverage at [email or web address], password [facility] 05/17/2015, 7:01 PM     Care during the described time interval was provided by me .  I have reviewed this patient's available data, including medical history, events of note, physical examination, and all test results as part of my evaluation. I have personally reviewed and interpreted all radiology studies.   Dia Crawford, MD [phone] Pager

## 2015-05-17 NOTE — ED Notes (Signed)
The pt is c/o chest and abd pain all day.  He was just discharged from the hospital Thursday.  C/o sob nausea also

## 2015-05-17 NOTE — ED Provider Notes (Signed)
CSN: [id number]     Arrival date & time 05/17/15  0209 History  By signing my name below, I, Rowan Blase, attest that this documentation has been prepared under the direction and in the presence of Orpah Greek, MD . Electronically Signed: Rowan Blase, Scribe. 05/17/2015. 2:51 AM.   Chief Complaint  Patient presents with  . Chest Pain   The history is provided by the patient. No language interpreter was used.   HPI Comments:  [name] is a 82 y.o. male with PMhx of CAD, HLD, DM, CKD, CHF, and PVD who presents to the Emergency Department complaining of constant, low-central chest pain onset yesterday evening while sitting. Pt reports associated intermittent shortness of breath and nausea. No alleviating factors noted or treatments attempted PTA. He was discharged from [facility] 3 days ago following treatment for GI bleed. Pt reports bypass ~14 years ago; he notes increased health problems in the last year. Relative reports pt was c/o shortness of breath and shoulder pain a few days prior to discharge; pt did receive transfusion during hospitalization. Pt denies vomiting, bloody stools, or frequent use of nitroglycerin.  Past Medical History  Diagnosis Date  . CAD (coronary artery disease)     a. H/o MI in 1998;  b. s/p CABG 2003. c. 10/2014 NSTEMI/Cath: LM 75, LAD 117m/d, D1 95, D2 50, LCX 100ost/p, OM1 80, OM2 80, RCA 100p/m, RPDA fills via L->L collats, LIMA->LAD ok, VG->dRCA 100, VG->OM1->OM2 99 prox to OM1 insertion->recanalated ->Tx with heparin,  VG->D1 100-->Med Rx.  Marland Kitchen Hyperlipidemia   . Hypertensive heart disease   . Type II diabetes mellitus ([place])   . History of stomach ulcers 2013  . Arthritis     "touch in my fingers" (09/10/2014)  . B12 deficiency anemia     "stopped taking the shots in ~ 06/2014" (09/10/2014)  . Diabetes 1.5, managed as type 1 ([place])   . CKD (chronic kidney disease), stage IV ([place])   . Chronic systolic CHF (congestive heart failure)  (HCC)     a. EF 40-45% in 10/2014 (previously normal in 08/2014).  . Ischemic cardiomyopathy     a. 10/2014 Echo: EF 40-45%.  Marland Kitchen PVD (peripheral vascular disease) [facility])    Past Surgical History  Procedure Laterality Date  . Esophagogastroduodenoscopy  06/10/2011    Procedure: ESOPHAGOGASTRODUODENOSCOPY (EGD);  Surgeon: Ladene Artist, MD,FACG;  Location: [facility] ENDOSCOPY;  Service: Endoscopy;  Laterality: N/A;  . Coronary artery bypass graft  2003    CABG X5  . Cardiac catheterization  2003;   . Coronary angioplasty  1998    [name] 07/13/2010  . Cataract extraction, bilateral Bilateral   . Cardiac catheterization N/A 11/07/2014    Left Heart Cath; Belva Crome, MD;   . Colonoscopy    . Colonoscopy N/A 05/12/2015    Procedure: COLONOSCOPY;  Surgeon: Milus Banister, MD;  Location: [place];  Service: Endoscopy;  Laterality: N/A;   Family History  Problem Relation Age of Onset  . Diabetes      brothers and sisters  . Hypertension Sister   . Coronary artery disease    . Stroke Mother   . Hypertension Mother   . Cancer Mother   . Heart attack Sister    Social History  Substance Use Topics  . Smoking status: Former Smoker -- 0.50 packs/day for 20 years    Types: Cigarettes  . Smokeless tobacco: Never Used     Comment: "quit smoking cigarettes in the 1970's  .  Alcohol Use: No    Review of Systems  Respiratory: Positive for shortness of breath.   Cardiovascular: Positive for chest pain.  Gastrointestinal: Positive for nausea. Negative for vomiting and blood in stool.  All other systems reviewed and are negative.   Allergies  Review of patient's allergies indicates no known allergies.  Home Medications   Prior to Admission medications   Medication Sig Start Date End Date Taking? Authorizing Provider  acetaminophen (TYLENOL) 500 MG tablet Take 1,000 mg by mouth every 6 (six) hours as needed for moderate pain.   Yes Historical Provider, MD  amLODipine (NORVASC) 10 MG tablet  TAKE 1 TABLET DAILY 02/18/15  Yes Marletta Lor, MD  aspirin EC 81 MG tablet Take 81 mg by mouth at bedtime.   Yes Historical Provider, MD  atorvastatin (LIPITOR) 80 MG tablet Take 1 tablet (80 mg total) by mouth daily at 6 PM. 11/12/14  Yes Almyra Deforest, PA  carvedilol (COREG) 25 MG tablet Take 1 tablet (25 mg total) by mouth 2 (two) times daily with a meal. 01/01/15  Yes Almyra Deforest, PA  cloNIDine (CATAPRES) 0.1 MG tablet TAKE 1 TABLET AT BEDTIME 04/06/15  Yes Marletta Lor, MD  furosemide (LASIX) 40 MG tablet Take one-half tablet by mouth in the AM and one tablet by mouth in the PM 04/02/15  Yes Belva Crome, MD  isosorbide mononitrate (IMDUR) 60 MG 24 hr tablet Take 1 tablet (60 mg total) by mouth daily. 05/14/15  Yes Reyne Dumas, MD  LANTUS SOLOSTAR 100 UNIT/ML Solostar Pen INJECT 14 UNITS INTO THE SKIN DAILY AT 10 PM. 04/02/15  Yes Marletta Lor, MD  nitroGLYCERIN (NITROSTAT) 0.4 MG SL tablet Place 0.4 mg under the tongue every 5 (five) minutes as needed for chest pain (3 doses MAX).   Yes Historical Provider, MD  NOVOLOG FLEXPEN 100 UNIT/ML FlexPen Inject 14 Units into the skin 3 (three) times daily. 03/03/15  Yes Historical Provider, MD  pantoprazole (PROTONIX) 40 MG tablet Take 1 tablet (40 mg total) by mouth daily. 05/14/15  Yes Reyne Dumas, MD  pentoxifylline (TRENTAL) 400 MG CR tablet TAKE 1 TABLET TWICE DAILY  AT  10AM  AND  5PM 04/02/15  Yes Marletta Lor, MD  Potassium Chloride ER 20 MEQ TBCR Take 20 mEq by mouth daily. 04/27/15  Yes Marletta Lor, MD  timolol (TIMOPTIC) 0.5 % ophthalmic solution Place 1 drop into both eyes every morning. 04/27/15  Yes Historical Provider, MD  TRAVATAN Z 0.004 % SOLN ophthalmic solution Place 1 drop into both eyes every evening. 04/27/15  Yes Historical Provider, MD   BP 134/70 mmHg  Pulse 75  Temp(Src) 97.8 F (36.6 C)  Resp 13  Ht 5\' 11"  (1.803 m)  Wt 172 lb 2 oz (78.075 kg)  BMI 24.02 kg/m2  SpO2 98% Physical Exam  Constitutional:  He is oriented to person, place, and time. He appears well-developed and well-nourished. No distress.  HENT:  Head: Normocephalic and atraumatic.  Right Ear: Hearing normal.  Left Ear: Hearing normal.  Nose: Nose normal.  Mouth/Throat: Oropharynx is clear and moist and mucous membranes are normal.  Eyes: Conjunctivae and EOM are normal. Pupils are equal, round, and reactive to light.  Neck: Normal range of motion. Neck supple.  Cardiovascular: Regular rhythm, S1 normal and S2 normal.  Exam reveals no gallop and no friction rub.   No murmur heard. Pulmonary/Chest: Effort normal and breath sounds normal. No respiratory distress. He exhibits no tenderness.  Abdominal: Soft. Normal appearance and bowel sounds are normal. There is no hepatosplenomegaly. There is tenderness. There is no rebound, no guarding, no tenderness at McBurney's point and negative Murphy's sign. No hernia.  Mild epigastric TTP  Musculoskeletal: Normal range of motion.  Neurological: He is alert and oriented to person, place, and time. He has normal strength. No cranial nerve deficit or sensory deficit. Coordination normal. GCS eye subscore is 4. GCS verbal subscore is 5. GCS motor subscore is 6.  Skin: Skin is warm, dry and intact. No rash noted. No cyanosis.  Psychiatric: He has a normal mood and affect. His speech is normal and behavior is normal. Thought content normal.  Nursing note and vitals reviewed.   ED Course  Procedures  DIAGNOSTIC STUDIES:  Oxygen Saturation is 97% on RA, normal by my interpretation.    COORDINATION OF CARE:  2:47 AM Discussed treatment plan with pt at bedside and pt agreed to plan.  Labs Review Labs Reviewed  BASIC METABOLIC PANEL - Abnormal; Notable for the following:    CO2 21 (*)    Glucose, Bld 202 (*)    BUN 34 (*)    Creatinine, Ser 3.31 (*)    GFR calc non Af Amer 16 (*)    GFR calc Af Amer 19 (*)    All other components within normal limits  CBC - Abnormal; Notable for  the following:    RBC 3.12 (*)    Hemoglobin 8.6 (*)    HCT 26.5 (*)    RDW 17.6 (*)    All other components within normal limits  TROPONIN I - Abnormal; Notable for the following:    Troponin I 0.09 (*)    All other components within normal limits  BRAIN NATRIURETIC PEPTIDE - Abnormal; Notable for the following:    B Natriuretic Peptide 509.4 (*)    All other components within normal limits  PROTIME-INR    Imaging Review Dg Chest 2 View  05/17/2015  CLINICAL DATA:  Chest abdominal pain all day. Recent discharge from the hospital for GI bleed. EXAM: CHEST  2 VIEW COMPARISON:  12/29/2014. FINDINGS: Unchanged linear basilar opacities, likely due to scarring. The lungs are otherwise clear. There is no pleural effusion. Hilar and mediastinal contours are unremarkable and unchanged. Borderline cardiomegaly, unchanged. There is prior sternotomy and CABG. IMPRESSION: Unchanged cardiomegaly. Linear basilar scarring. No acute cardiopulmonary findings. Electronically Signed   By: Andreas Newport M.D.   On: 05/17/2015 02:56   I have personally reviewed and evaluated these images and lab results as part of my medical decision-making.   EKG Interpretation   Date/Time:  Sunday May 17 2015 02:19:36 EDT Ventricular Rate:  70 PR Interval:  222 QRS Duration: 110 QT Interval:  426 QTC Calculation: 460 R Axis:   -49 Text Interpretation:  Sinus rhythm with sinus arrhythmia with 1st degree  A-V block Left axis deviation Nonspecific T wave abnormality Prolonged QT  Abnormal ECG No significant change since last tracing Confirmed by POLLINA   MD, [place] [id number]) on 05/17/2015 3:21:01 AM      MDM   Final diagnoses:  Chest pain, unspecified chest pain type    Patient presents to the emergency department for evaluation of chest pain. Patient reports previous history of coronary artery disease, status post bypass surgery. Patient did have an MI in 2016 as well. Patient has known severe coronary  artery disease and is essentially medical management at this time because of his renal failure. He is complaining  of pain in the epigastric region currently. There is some mild tenderness in this region, however, pain is also in the anterior chest. It's unclear if this is cardiac in etiology at this time. EKG does not show any obvious ischemia or infarct. His troponin is mildly elevated, but this appears to be chronic. Based on his previous cardiac history, will require further cardiac rule out.  Was hospitalized earlier this week for lower GI bleed. Colonoscopy showed diverticular bleed. Hemoglobin has dropped once again. He has not noticed any active bleeding, but suspect that there is some ongoing bleeding. Will need to watch his hemoglobin and hematocrit as well.  Patient does have a history of congestive heart failure. He appears to be well compensated at this time.  I personally performed the services described in this documentation, which was scribed in my presence. The recorded information has been reviewed and is accurate.    Orpah Greek, MD 05/17/15 [phone]

## 2015-05-17 NOTE — ED Notes (Signed)
Dr. Pollina at bedside   

## 2015-05-18 ENCOUNTER — Ambulatory Visit: Payer: Commercial Managed Care - HMO | Admitting: Internal Medicine

## 2015-05-18 ENCOUNTER — Encounter (HOSPITAL_COMMUNITY): Payer: Self-pay | Admitting: Physician Assistant

## 2015-05-18 ENCOUNTER — Observation Stay (HOSPITAL_COMMUNITY): Payer: Commercial Managed Care - HMO

## 2015-05-18 DIAGNOSIS — E0821 Diabetes mellitus due to underlying condition with diabetic nephropathy: Secondary | ICD-10-CM

## 2015-05-18 DIAGNOSIS — R1013 Epigastric pain: Secondary | ICD-10-CM | POA: Diagnosis not present

## 2015-05-18 DIAGNOSIS — R109 Unspecified abdominal pain: Secondary | ICD-10-CM | POA: Insufficient documentation

## 2015-05-18 DIAGNOSIS — I25700 Atherosclerosis of coronary artery bypass graft(s), unspecified, with unstable angina pectoris: Secondary | ICD-10-CM

## 2015-05-18 DIAGNOSIS — N179 Acute kidney failure, unspecified: Secondary | ICD-10-CM | POA: Diagnosis not present

## 2015-05-18 DIAGNOSIS — K8020 Calculus of gallbladder without cholecystitis without obstruction: Secondary | ICD-10-CM | POA: Diagnosis not present

## 2015-05-18 DIAGNOSIS — I5032 Chronic diastolic (congestive) heart failure: Secondary | ICD-10-CM | POA: Insufficient documentation

## 2015-05-18 DIAGNOSIS — N189 Chronic kidney disease, unspecified: Secondary | ICD-10-CM

## 2015-05-18 DIAGNOSIS — R079 Chest pain, unspecified: Secondary | ICD-10-CM

## 2015-05-18 DIAGNOSIS — R7989 Other specified abnormal findings of blood chemistry: Secondary | ICD-10-CM | POA: Diagnosis not present

## 2015-05-18 DIAGNOSIS — I2510 Atherosclerotic heart disease of native coronary artery without angina pectoris: Secondary | ICD-10-CM | POA: Diagnosis not present

## 2015-05-18 DIAGNOSIS — I255 Ischemic cardiomyopathy: Secondary | ICD-10-CM

## 2015-05-18 DIAGNOSIS — Z794 Long term (current) use of insulin: Secondary | ICD-10-CM

## 2015-05-18 DIAGNOSIS — I25709 Atherosclerosis of coronary artery bypass graft(s), unspecified, with unspecified angina pectoris: Secondary | ICD-10-CM | POA: Diagnosis not present

## 2015-05-18 LAB — MAGNESIUM: MAGNESIUM: 1.7 mg/dL (ref 1.7–2.4)

## 2015-05-18 LAB — CBC WITH DIFFERENTIAL/PLATELET
BASOS ABS: 0 10*3/uL (ref 0.0–0.1)
BASOS PCT: 0 %
Eosinophils Absolute: 0.2 10*3/uL (ref 0.0–0.7)
Eosinophils Relative: 2 %
HEMATOCRIT: 25.6 % — AB (ref 39.0–52.0)
Hemoglobin: 8.8 g/dL — ABNORMAL LOW (ref 13.0–17.0)
Lymphocytes Relative: 10 %
Lymphs Abs: 0.8 10*3/uL (ref 0.7–4.0)
MCH: 28 pg (ref 26.0–34.0)
MCHC: 34.4 g/dL (ref 30.0–36.0)
MCV: 81.5 fL (ref 78.0–100.0)
MONO ABS: 0.5 10*3/uL (ref 0.1–1.0)
Monocytes Relative: 7 %
NEUTROS ABS: 6.5 10*3/uL (ref 1.7–7.7)
Neutrophils Relative %: 81 %
PLATELETS: 198 10*3/uL (ref 150–400)
RBC: 3.14 MIL/uL — ABNORMAL LOW (ref 4.22–5.81)
RDW: 18.7 % — AB (ref 11.5–15.5)
WBC: 8.1 10*3/uL (ref 4.0–10.5)

## 2015-05-18 LAB — FERRITIN: Ferritin: 27 ng/mL (ref 24–336)

## 2015-05-18 LAB — COMPREHENSIVE METABOLIC PANEL
ALT: 493 U/L — ABNORMAL HIGH (ref 17–63)
ANION GAP: 12 (ref 5–15)
AST: 339 U/L — AB (ref 15–41)
Albumin: 3.2 g/dL — ABNORMAL LOW (ref 3.5–5.0)
Alkaline Phosphatase: 210 U/L — ABNORMAL HIGH (ref 38–126)
BUN: 37 mg/dL — AB (ref 6–20)
CO2: 22 mmol/L (ref 22–32)
Calcium: 8.9 mg/dL (ref 8.9–10.3)
Chloride: 107 mmol/L (ref 101–111)
Creatinine, Ser: 3.13 mg/dL — ABNORMAL HIGH (ref 0.61–1.24)
GFR calc Af Amer: 20 mL/min — ABNORMAL LOW (ref >60)
GFR, EST NON AFRICAN AMERICAN: 17 mL/min — AB (ref >60)
Glucose, Bld: 125 mg/dL — ABNORMAL HIGH (ref 65–99)
POTASSIUM: 3.2 mmol/L — AB (ref 3.5–5.1)
Sodium: 141 mmol/L (ref 135–145)
TOTAL PROTEIN: 6 g/dL — AB (ref 6.5–8.1)
Total Bilirubin: 1.4 mg/dL — ABNORMAL HIGH (ref 0.3–1.2)

## 2015-05-18 LAB — VITAMIN B12: Vitamin B-12: 895 pg/mL (ref 180–914)

## 2015-05-18 LAB — GLUCOSE, CAPILLARY
GLUCOSE-CAPILLARY: 116 mg/dL — AB (ref 65–99)
GLUCOSE-CAPILLARY: 134 mg/dL — AB (ref 65–99)
GLUCOSE-CAPILLARY: 114 mg/dL — AB (ref 65–99)
GLUCOSE-CAPILLARY: 179 mg/dL — AB (ref 65–99)

## 2015-05-18 LAB — LACTATE DEHYDROGENASE: LDH: 498 U/L — ABNORMAL HIGH (ref 98–192)

## 2015-05-18 LAB — LIPID PANEL
Cholesterol: 81 mg/dL (ref 0–200)
HDL: 22 mg/dL — AB (ref >40)
LDL CALC: 37 mg/dL (ref 0–99)
TRIGLYCERIDES: 111 mg/dL (ref <150)
Total CHOL/HDL Ratio: 3.7 RATIO
VLDL: 22 mg/dL (ref 0–40)

## 2015-05-18 LAB — HEMOGLOBIN AND HEMATOCRIT, BLOOD
HEMATOCRIT: 27.1 % — AB (ref 39.0–52.0)
HEMOGLOBIN: 9 g/dL — AB (ref 13.0–17.0)

## 2015-05-18 LAB — FOLATE: FOLATE: 22.2 ng/mL (ref >5.9)

## 2015-05-18 LAB — IRON AND TIBC
IRON: 44 ug/dL — AB (ref 45–182)
Saturation Ratios: 11 % — ABNORMAL LOW (ref 17.9–39.5)
TIBC: 402 ug/dL (ref 250–450)
UIBC: 358 ug/dL

## 2015-05-18 LAB — TROPONIN I: Troponin I: 0.08 ng/mL — ABNORMAL HIGH (ref <0.031)

## 2015-05-18 MED ORDER — ZOLPIDEM TARTRATE 5 MG PO TABS
5.0000 mg | ORAL_TABLET | Freq: Every evening | ORAL | Status: DC | PRN
  Administered 2015-05-18 – 2015-05-19 (×2): 5 mg via ORAL
  Filled 2015-05-18 (×2): qty 1

## 2015-05-18 NOTE — Care Management Obs Status (Signed)
[place] NOTIFICATION   Patient Details  Name: [name] MRN: [id number] Date of Birth: [birth year]   Medicare Observation Status Notification Given:  Yes (chest pain)    Bethena Roys, RN 05/18/2015, 10:49 AM

## 2015-05-18 NOTE — Consult Note (Signed)
CARDIOLOGY CONSULT NOTE   Patient ID: [name] MRN: [id number] DOB/AGE: 04-20-1931 82 y.o.  Admit date: 05/17/2015  Primary Physician   [name], MD Primary Cardiologist   Dr Tamala Julian Reason for Consultation   Chest pain, elevated troponin  [id number] [name] is a 82 y.o. year old male with a history of gastric ulcers, lower GI bleed, CAD status post CABG w/failed native and SVG but patent LIMA-LAD on plavix and aspirin, ICM, IDDM, and CKD stage IV.   Seen in consult 03/16 because he was on Plavix and ASA, came in w/ LGIB. Since MI 2016 was treated medically, Plavix was d/c'd, continued on ASA. He also was having CHF exacerbation after hydration and transfusion 4 U for hypotension and anemia 2nd LGIB. D/c 03/16.   Since d/c, he has not had any ischemic symptoms. His stools were brown and he was doing well until yesterday, when he had peri-umbilical abdominal pain. Sharp pain through to his back. He has been taking the ASA but not Plavix as requested. He is very sure that he has not had any angina. Breathing has been good. He has a small amount of pedal edema, but no new DOE, orthopnea or PND. Still weak from the previous hospitalization.   Past Medical History  Diagnosis Date  . CAD (coronary artery disease)     a. H/o MI in 1998;  b. s/p CABG 2003. c. 10/2014 NSTEMI/Cath: LM 75, LAD 129m/d, D1 95, D2 50, LCX 100ost/p, OM1 80, OM2 80, RCA 100p/m, RPDA fills via L->L collats, LIMA->LAD ok, VG->dRCA 100, VG->OM1->OM2 99 prox to OM1 insertion->recanalated ->Tx with heparin,  VG->D1 100-->Med Rx.  Marland Kitchen Hyperlipidemia   . Hypertensive heart disease   . Type II diabetes mellitus ([place])   . History of stomach ulcers 2013  . Arthritis     "touch in my fingers" (09/10/2014)  . B12 deficiency anemia     "stopped taking the shots in ~ 06/2014" (09/10/2014)  . Diabetes 1.5, managed as type 1 ([place])   . CKD (chronic kidney disease), stage IV ([place])   . Chronic systolic  CHF (congestive heart failure) (HCC)     a. EF 40-45% in 10/2014 (previously normal in 08/2014).  . Ischemic cardiomyopathy     a. 10/2014 Echo: EF 40-45%.  Marland Kitchen PVD (peripheral vascular disease) [facility])      Past Surgical History  Procedure Laterality Date  . Esophagogastroduodenoscopy  06/10/2011    Procedure: ESOPHAGOGASTRODUODENOSCOPY (EGD);  Surgeon: Ladene Artist, MD,FACG;  Location: [facility] ENDOSCOPY;  Service: Endoscopy;  Laterality: N/A;  . Coronary artery bypass graft  2003    CABG X5  . Cardiac catheterization  2003;   . Coronary angioplasty  1998    [name] 07/13/2010  . Cataract extraction, bilateral Bilateral   . Cardiac catheterization N/A 11/07/2014    Left Heart Cath; Belva Crome, MD;   . Colonoscopy    . Colonoscopy N/A 05/12/2015    Procedure: COLONOSCOPY;  Surgeon: Milus Banister, MD;  Location: [place];  Service: Endoscopy;  Laterality: N/A;    No Known Allergies  I have reviewed the patient's current medications . sodium chloride   Intravenous Once  . amLODipine  10 mg Oral Daily  . carvedilol  25 mg Oral BID WC  . cloNIDine  0.1 mg Oral QHS  . insulin aspart  0-15 Units Subcutaneous TID WC  . insulin glargine  8 Units Subcutaneous QHS  . isosorbide mononitrate  60  mg Oral Daily  . latanoprost  1 drop Both Eyes QHS  . pantoprazole  40 mg Oral BID  . pentoxifylline  400 mg Oral BID  . sodium chloride flush  3 mL Intravenous Q12H  . timolol  1 drop Both Eyes q morning - 10a     sodium chloride, acetaminophen, acetaminophen, HYDROcodone-acetaminophen, nitroGLYCERIN, ondansetron (ZOFRAN) IV, sodium chloride flush  Medication Sig  acetaminophen (TYLENOL) 500 MG tablet Take 1,000 mg by mouth every 6 (six) hours as needed for moderate pain.  amLODipine (NORVASC) 10 MG tablet TAKE 1 TABLET DAILY  aspirin EC 81 MG tablet Take 81 mg by mouth at bedtime.  atorvastatin (LIPITOR) 80 MG tablet Take 1 tablet (80 mg total) by mouth daily at 6 PM.  carvedilol (COREG) 25 MG  tablet Take 1 tablet (25 mg total) by mouth 2 (two) times daily with a meal.  cloNIDine (CATAPRES) 0.1 MG tablet TAKE 1 TABLET AT BEDTIME  furosemide (LASIX) 40 MG tablet Take one-half tablet by mouth in the AM and one tablet by mouth in the PM  isosorbide mononitrate (IMDUR) 60 MG 24 hr tablet Take 1 tablet (60 mg total) by mouth daily.  LANTUS SOLOSTAR 100 UNIT/ML Solostar Pen INJECT 14 UNITS INTO THE SKIN DAILY AT 10 PM.  nitroGLYCERIN (NITROSTAT) 0.4 MG SL tablet Place 0.4 mg under the tongue every 5 (five) minutes as needed for chest pain (3 doses MAX).  NOVOLOG FLEXPEN 100 UNIT/ML FlexPen Inject 14 Units into the skin 3 (three) times daily.  pantoprazole (PROTONIX) 40 MG tablet Take 1 tablet (40 mg total) by mouth daily.  pentoxifylline (TRENTAL) 400 MG CR tablet TAKE 1 TABLET TWICE DAILY  AT  10AM  AND  5PM  Potassium Chloride ER 20 MEQ TBCR Take 20 mEq by mouth daily.  timolol (TIMOPTIC) 0.5 % ophthalmic solution Place 1 drop into both eyes every morning.  TRAVATAN Z 0.004 % SOLN ophthalmic solution Place 1 drop into both eyes every evening.     Social History   Social History  . Marital Status: Widowed    Spouse Name: N/A  . Number of Children: N/A  . Years of Education: N/A   Occupational History  . Retired    Social History Main Topics  . Smoking status: Former Smoker -- 0.50 packs/day for 20 years    Types: Cigarettes  . Smokeless tobacco: Never Used     Comment: "quit smoking cigarettes in the 1970's  . Alcohol Use: No  . Drug Use: No  . Sexual Activity: Not Currently   Other Topics Concern  . Not on file   Social History Narrative   Pt lives alone, family nearby.    Family Status  Relation Status Death Age  . Mother Deceased 42  . Maternal Grandmother Deceased   . Maternal Grandfather Deceased   . Paternal Grandmother Deceased   . Paternal Grandfather Deceased   . Father Deceased    Family History  Problem Relation Age of Onset  . Diabetes       brothers and sisters  . Hypertension Sister   . Coronary artery disease    . Stroke Mother   . Hypertension Mother   . Cancer Mother   . Heart attack Sister      ROS:  Full 14 point review of systems complete and found to be negative unless listed above.  Physical Exam: Blood pressure 122/58, pulse 78, temperature 98.9 F (37.2 C), temperature source Oral, resp. rate 18, height 5'  11" (1.803 m), weight 168 lb 3.4 oz (76.3 kg), SpO2 98 %.  General: Well developed, well nourished, male in no acute distress Head: Eyes PERRLA, No xanthomas.   Normocephalic and atraumatic, oropharynx without edema or exudate. Dentition: good Lungs: CTA bilaterally Heart: HRRR S1 S2, no rub/gallop, ?soft murmur. pulses are 2+ all 4 extrem.   Neck: No carotid bruits. No lymphadenopathy.  JVD not elevated. Abdomen: Bowel sounds present, abdomen soft and non-tender without masses or hernias noted. Msk:  No spine or cva tenderness. No weakness, no joint deformities or effusions. Extremities: No clubbing or cyanosis. 1+ pedal edema.  Neuro: Alert and oriented X 3. No focal deficits noted. Psych:  Good affect, responds appropriately Skin: No rashes or lesions noted.  Labs:   Lab Results  Component Value Date   WBC 8.1 05/18/2015   HGB 8.8* 05/18/2015   HCT 25.6* 05/18/2015   MCV 81.5 05/18/2015   PLT 198 05/18/2015    Recent Labs  05/17/15 0224  INR 1.18     Recent Labs Lab 05/18/15 0552  NA 141  K 3.2*  CL 107  CO2 22  BUN 37*  CREATININE 3.13*  CALCIUM 8.9  PROT 6.0*  BILITOT 1.4*  ALKPHOS 210*  ALT 493*  AST 339*  GLUCOSE 125*  ALBUMIN 3.2*   MAGNESIUM  Date Value Ref Range Status  05/18/2015 1.7 1.7 - 2.4 mg/dL Final    Recent Labs  05/17/15 0224 05/17/15 0705 05/17/15 1535 05/17/15 2329  TROPONINI 0.09* 0.06* 0.08* 0.08*   B NATRIURETIC PEPTIDE  Date/Time Value Ref Range Status  05/17/2015 02:24 AM 509.4* 0.0 - 100.0 pg/mL Final  12/29/2014 11:40 AM 1114.2* 0.0 -  100.0 pg/mL Final   Lab Results  Component Value Date   CHOL 81 05/17/2015   HDL 22* 05/17/2015   LDLCALC 37 05/17/2015   TRIG 111 05/17/2015   VITAMIN B-12  Date/Time Value Ref Range Status  05/17/2015 11:29 PM 895 180 - 914 pg/mL Final   FOLATE  Date/Time Value Ref Range Status  05/17/2015 11:29 PM 22.2 >5.9 ng/mL Final   FERRITIN  Date/Time Value Ref Range Status  05/17/2015 11:29 PM 27 24 - 336 ng/mL Final   TIBC  Date/Time Value Ref Range Status  05/17/2015 11:29 PM 402 250 - 450 ug/dL Final   IRON  Date/Time Value Ref Range Status  05/17/2015 11:29 PM 44* 45 - 182 ug/dL Final   RETIC CT PCT  Date/Time Value Ref Range Status  05/17/2015 11:29 PM 2.1 0.4 - 3.1 % Final    Echo: 10/2014 - Left ventricle: The cavity size was normal. Wall thickness was  normal. Systolic function was mildly to moderately reduced. The  estimated ejection fraction was in the range of 40% to 45%.  Akinesis of the inferior myocardium. - Mitral valve: There was mild regurgitation. - Pulmonary arteries: Systolic pressure was moderately increased.  PA peak pressure: 46 mm Hg (S).  ECG:  SR, non-specific changes, similar to previous ECG QT is 426 ms, prev QT was 422 ms  Radiology:  Dg Chest 2 View 05/17/2015  CLINICAL DATA:  Chest abdominal pain all day. Recent discharge from the hospital for GI bleed. EXAM: CHEST  2 VIEW COMPARISON:  12/29/2014. FINDINGS: Unchanged linear basilar opacities, likely due to scarring. The lungs are otherwise clear. There is no pleural effusion. Hilar and mediastinal contours are unremarkable and unchanged. Borderline cardiomegaly, unchanged. There is prior sternotomy and CABG. IMPRESSION: Unchanged cardiomegaly. Linear basilar scarring. No acute  cardiopulmonary findings. Electronically Signed   By: Andreas Newport M.D.   On: 05/17/2015 02:56    ASSESSMENT AND PLAN:   The patient was seen today by Dr Harrington Challenger, the patient evaluated and the data reviewed.   Principal Problem:   Chest pain Pt does not complain of CP but abdominal discomfort  USN of abdomen pending    CAD  Pt with history of severe CAD of native velles;  Occlusion of SVG to RCA, SVG to diag and high grated stenosis of SVG to OM1.  LIMA to LAD patent with L to R collaterals (cath in Sept 2016)  On medical therapy   - continue current therapy for CAD with ASA, BB, Imdur.  - no ACE/ARB due to poor renal function     Elevated troponin Trivial elevation  In setting of known severe CAD  I would follow  No trend in markers   No CP    Hyperlipidemia   Statin on hold with elevated LFTs   Chronic diastolic CHF  Volume status is OK     Otherwise, per IM Active Problems:   Diabetes mellitus with renal complications (HCC)   Essential hypertension   Coronary atherosclerosis of autologous vein bypass graft   Elevated troponin   CAD (coronary artery disease) of bypass graft   Systolic and diastolic CHF, chronic (HCC)   Epigastric pain   Pain in the chest   CAD in native artery   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   Dorris Carnes

## 2015-05-18 NOTE — Progress Notes (Signed)
[facility]                                                                              Patient Demographics  [name], is a 82 y.o. male, DOB - 1931-12-23, [id number]  Admit date - 05/17/2015   Admitting Physician Vianne Bulls, MD  Outpatient Primary MD for the patient is [name], MD  LOS -   days    Chief Complaint  Patient presents with  . Chest Pain       Brief HPI   Patient is 82 year old male with PUD, lower GI bleed, CAD status post CABG, ischemic cardiomyopathy, insulin-dependent diabetes, chronic kidney disease stage IV presented to ED with pain in the lower chest and epigastric 3 days after being discharged from the hospital.  Patient was admitted from 05/10/2015 - 05/14/2015 with lower GI bleed requiring transfusion. He was discharged home in much improved and stable condition, and initially did quite well. NAD patient was afebrile, vital signs were stable. Initial blood work showed serum creatinine of 3.31 up from 2.55 at the time of discharge. Troponin 0.09, BNP elevated at 509. Hemoglobin is 8.6, down from 9.4. Patient was admitted for further workup.   Assessment & Plan   Chest pain atypical - Currently resolved, Initial trop is 0.09-> 0.08 most likely baseline given his cardiac history and renal failure  - EKG without convincing features for acute ischemia  - All the anticoagulants including aspirin, Plavix, heparin subcutaneous has been held  - Cardiology consult called, recommended continue current therapy for CAD with aspirin, beta blocker and Imdur, no ACE/ARB due to renal insufficiency - Hold statin secondary to transaminitis  Acute on Chronic Anemia  - Most likely multifactorial to include CKD, slow blood loss from diverticulosis - Hgb 8.6 on arrival, down from 9.3 three days prior, to patient received 1 unit packed , H&H improved to 9.1.  -Continue PPI  - Plavix was discontinued during the previous  admission.  Transaminitis: Unclear etiology - Hold statins, obtain right upper quadrant abdominal ultrasound, hepatitis panel - If LFTs elevated in a.m., will consult with gastroenterology, may need MRCP  DM Type 2 uncontrolled renal complications -  [id number]  7.7%  - Continue Lantus, sliding scale insulin  Chronic systolic and diastolic CHF/ischemic cardiomyopathy  - TTE (11/07/2014) EF 40-45%, mild MR, akinetic inferior aspect  - BNP 509 on admission  -3/19 transfused 1 unit PRBC  Acute on chronic renal failure (Cr baseline~ 2.5- 3.3) unclear etiology:  - slightly improving today, 3.1, up from 2.55 at time of discharge   - Avoiding nephrotoxins, hold ACE/ARB until renal function improves   Pedal edema -Most likely multifactorial to include ischemic cardiomyopathy and acute on chronic renal failure -TED hose  Code Status: Full code  Family Communication: Discussed in detail with the patient, all imaging results, lab results explained to the patient   Disposition Plan: Hopefully tomorrow   Time Spent in minutes   25 minutes  Procedures  None   Consults   card  DVT Prophylaxis   SCD's  Medications  Scheduled Meds: . sodium chloride   Intravenous Once  .  amLODipine  10 mg Oral Daily  . carvedilol  25 mg Oral BID WC  . cloNIDine  0.1 mg Oral QHS  . insulin aspart  0-15 Units Subcutaneous TID WC  . insulin glargine  8 Units Subcutaneous QHS  . isosorbide mononitrate  60 mg Oral Daily  . latanoprost  1 drop Both Eyes QHS  . pantoprazole  40 mg Oral BID  . pentoxifylline  400 mg Oral BID  . sodium chloride flush  3 mL Intravenous Q12H  . timolol  1 drop Both Eyes q morning - 10a   Continuous Infusions:  PRN Meds:.sodium chloride, acetaminophen, acetaminophen, HYDROcodone-acetaminophen, nitroGLYCERIN, ondansetron (ZOFRAN) IV, sodium chloride flush   Antibiotics   Anti-infectives    None        Subjective:   [name] was seen and  examined today.  No complaints per patient, no chest pain or abdominal pain at this time. Patient denies dizziness, N/V/D/C, new weakness, numbess, tingling. No acute events overnight.    Objective:   Filed Vitals:   05/17/15 2200 05/18/15 0515 05/18/15 0748 05/18/15 1142  BP: 121/54 120/49 122/58 120/60  Pulse: 72 77 78 80  Temp:  99.3 F (37.4 C) 98.9 F (37.2 C) 99 F (37.2 C)  TempSrc:  Oral Oral Oral  Resp: 18 18 18 18   Height:      Weight:  76.3 kg (168 lb 3.4 oz)    SpO2: 100% 97% 98% 99%    Intake/Output Summary (Last 24 hours) at 05/18/15 1407 Last data filed at 05/18/15 1251  Gross per 24 hour  Intake   1068 ml  Output   1300 ml  Net   -232 ml     Wt Readings from Last 3 Encounters:  05/18/15 76.3 kg (168 lb 3.4 oz)  05/14/15 77.7 kg (171 lb 4.8 oz)  04/22/15 73.936 kg (163 lb)     Exam  General: Alert and oriented x 3, NAD  HEENT:    Neck: Supple, no JVD, no masses  CVS: S1 S2 auscultated, no rubs, murmurs or gallops. Regular rate and rhythm.  Respiratory: Clear to auscultation bilaterally, no wheezing, rales or rhonchi  Abdomen: Soft, nontender, nondistended, + bowel sounds  Ext: no cyanosis clubbing or edema  Neuro: AAOx3, Cr N's II- XII. Strength 5/5 upper and lower extremities bilaterally  Skin: No rashes  Psych: Normal affect and demeanor, alert and oriented x3    Data Reviewed:  I have personally reviewed following labs and imaging studies  Micro Results Recent Results (from the past 240 hour(s))  MRSA PCR Screening     Status: None   Collection Time: 05/10/15  4:21 PM  Result Value Ref Range Status   MRSA by PCR NEGATIVE NEGATIVE Final    Comment:        The GeneXpert MRSA Assay (FDA approved for NASAL specimens only), is one component of a comprehensive MRSA colonization surveillance program. It is not intended to diagnose MRSA infection nor to guide or monitor treatment for MRSA infections.     Radiology Reports Dg  Chest 2 View  05/17/2015  CLINICAL DATA:  Chest abdominal pain all day. Recent discharge from the hospital for GI bleed. EXAM: CHEST  2 VIEW COMPARISON:  12/29/2014. FINDINGS: Unchanged linear basilar opacities, likely due to scarring. The lungs are otherwise clear. There is no pleural effusion. Hilar and mediastinal contours are unremarkable and unchanged. Borderline cardiomegaly, unchanged. There is prior sternotomy and CABG. IMPRESSION: Unchanged cardiomegaly. Linear basilar scarring. No  acute cardiopulmonary findings. Electronically Signed   By: Andreas Newport M.D.   On: 05/17/2015 02:56   Nm Gi Blood Loss  05/11/2015  CLINICAL DATA:  Patient with hematochezia. EXAM: NUCLEAR MEDICINE GASTROINTESTINAL BLEEDING SCAN TECHNIQUE: Sequential abdominal images were obtained following intravenous administration of Tc-9m labeled red blood cells. RADIOPHARMACEUTICALS:  25.3 mCi Tc-8m in-vitro labeled red cells. COMPARISON:  Renal ultrasound 11/14/2014 FINDINGS: Radiotracer is demonstrated coursing within the left lower quadrant towards the rectum, likely emanating from the sigmoid colon. Radiotracer is demonstrated within the blood pool. IMPRESSION: Findings compatible with acute GI bleed, likely emanating from the sigmoid colon. Critical Value/emergent results were called by telephone at the time of interpretation on 05/11/2015 at 12:45 pm to Dr. Azucena Freed , who verbally acknowledged these results. Electronically Signed   By: Lovey Newcomer M.D.   On: 05/11/2015 12:58    CBC  Recent Labs Lab 05/11/15 1755 05/12/15 0300  05/14/15 0619 05/17/15 0224 05/17/15 1236 05/17/15 2329 05/18/15 0552 05/18/15 1150  WBC 6.2 6.2  --  7.5 8.1  --   --  8.1  --   HGB 7.9* 7.0*  < > 9.3* 8.6* 8.0* 9.1* 8.8* 9.0*  HCT 25.0* 21.8*  < > 27.5* 26.5* 24.6* 28.4* 25.6* 27.1*  PLT 202 186  --  163 192  --   --  198  --   MCV 83.9 85.2  --  83.1 84.9  --   --  81.5  --   MCH 26.5 27.0  --  28.1 27.6  --   --  28.0  --    MCHC 31.6 31.7  --  33.8 32.5  --   --  34.4  --   RDW 16.8* 16.9*  --  17.0* 17.6*  --   --  18.7*  --   LYMPHSABS  --   --   --   --   --   --   --  0.8  --   MONOABS  --   --   --   --   --   --   --  0.5  --   EOSABS  --   --   --   --   --   --   --  0.2  --   BASOSABS  --   --   --   --   --   --   --  0.0  --   < > = values in this interval not displayed.  Chemistries   Recent Labs Lab 05/12/15 0300 05/14/15 0619 05/17/15 0224 05/18/15 0552  NA 144 144 141 141  K 4.8 4.4 4.1 3.2*  CL 116* 116* 108 107  CO2 20* 20* 21* 22  GLUCOSE 133* 156* 202* 125*  BUN 50* 23* 34* 37*  CREATININE 3.06* 2.55* 3.31* 3.13*  CALCIUM 8.6* 8.9 9.3 8.9  MG  --   --   --  1.7  AST 15 19  --  339*  ALT 10* 13*  --  493*  ALKPHOS 43 48  --  210*  BILITOT 0.6 0.5  --  1.4*   ------------------------------------------------------------------------------------------------------------------ estimated creatinine clearance is 19.7 mL/min (by C-G formula based on Cr of 3.13). ------------------------------------------------------------------------------------------------------------------ No results for input(s): HGBA1C in the last 72 hours. ------------------------------------------------------------------------------------------------------------------  Recent Labs  05/17/15 2329  CHOL 81  HDL 22*  LDLCALC 37  TRIG 111  CHOLHDL 3.7   ------------------------------------------------------------------------------------------------------------------ No results for input(s): TSH, T4TOTAL, T3FREE, THYROIDAB in  the last 72 hours.  Invalid input(s): FREET3 ------------------------------------------------------------------------------------------------------------------  Recent Labs  05/17/15 2329  VITAMINB12 895  FOLATE 22.2  FERRITIN 27  TIBC 402  IRON 44*  RETICCTPCT 2.1    Coagulation profile  Recent Labs Lab 05/17/15 0224  INR 1.18    No results for input(s): DDIMER in  the last 72 hours.  Cardiac Enzymes  Recent Labs Lab 05/17/15 0705 05/17/15 1535 05/17/15 2329  TROPONINI 0.06* 0.08* 0.08*   ------------------------------------------------------------------------------------------------------------------ Invalid input(s): POCBNP   Recent Labs  05/17/15 0817 05/17/15 1140 05/17/15 1653 05/17/15 2102 05/18/15 0719 05/18/15 1135  GLUCAP 183* 172* 162* 147* 116* 134*     RAI,RIPUDEEP M.D. [facility] 05/18/2015, 2:07 PM  Pager: [phone] Between 7am to 7pm - call Pager - [phone]  After 7pm go to [email or web address] - password [email or web address]  Call night coverage person covering after 7pm

## 2015-05-19 ENCOUNTER — Encounter (HOSPITAL_COMMUNITY): Payer: Self-pay | Admitting: General Surgery

## 2015-05-19 DIAGNOSIS — N179 Acute kidney failure, unspecified: Secondary | ICD-10-CM | POA: Diagnosis not present

## 2015-05-19 DIAGNOSIS — R079 Chest pain, unspecified: Secondary | ICD-10-CM | POA: Diagnosis not present

## 2015-05-19 DIAGNOSIS — I5032 Chronic diastolic (congestive) heart failure: Secondary | ICD-10-CM

## 2015-05-19 DIAGNOSIS — R1013 Epigastric pain: Secondary | ICD-10-CM

## 2015-05-19 DIAGNOSIS — I2510 Atherosclerotic heart disease of native coronary artery without angina pectoris: Secondary | ICD-10-CM | POA: Diagnosis not present

## 2015-05-19 DIAGNOSIS — I25700 Atherosclerosis of coronary artery bypass graft(s), unspecified, with unstable angina pectoris: Secondary | ICD-10-CM | POA: Diagnosis not present

## 2015-05-19 DIAGNOSIS — I25709 Atherosclerosis of coronary artery bypass graft(s), unspecified, with unspecified angina pectoris: Secondary | ICD-10-CM | POA: Diagnosis not present

## 2015-05-19 DIAGNOSIS — K8020 Calculus of gallbladder without cholecystitis without obstruction: Secondary | ICD-10-CM | POA: Diagnosis not present

## 2015-05-19 DIAGNOSIS — R7989 Other specified abnormal findings of blood chemistry: Secondary | ICD-10-CM | POA: Diagnosis not present

## 2015-05-19 DIAGNOSIS — K8010 Calculus of gallbladder with chronic cholecystitis without obstruction: Secondary | ICD-10-CM | POA: Diagnosis not present

## 2015-05-19 LAB — COMPREHENSIVE METABOLIC PANEL
ALBUMIN: 3.1 g/dL — AB (ref 3.5–5.0)
ALK PHOS: 176 U/L — AB (ref 38–126)
ALT: 340 U/L — ABNORMAL HIGH (ref 17–63)
ANION GAP: 11 (ref 5–15)
AST: 153 U/L — ABNORMAL HIGH (ref 15–41)
BUN: 40 mg/dL — ABNORMAL HIGH (ref 6–20)
CALCIUM: 9 mg/dL (ref 8.9–10.3)
CO2: 22 mmol/L (ref 22–32)
Chloride: 107 mmol/L (ref 101–111)
Creatinine, Ser: 3.1 mg/dL — ABNORMAL HIGH (ref 0.61–1.24)
GFR calc Af Amer: 20 mL/min — ABNORMAL LOW (ref >60)
GFR calc non Af Amer: 17 mL/min — ABNORMAL LOW (ref >60)
GLUCOSE: 118 mg/dL — AB (ref 65–99)
POTASSIUM: 3.2 mmol/L — AB (ref 3.5–5.1)
SODIUM: 140 mmol/L (ref 135–145)
Total Bilirubin: 0.7 mg/dL (ref 0.3–1.2)
Total Protein: 5.6 g/dL — ABNORMAL LOW (ref 6.5–8.1)

## 2015-05-19 LAB — CBC
HEMATOCRIT: 25.9 % — AB (ref 39.0–52.0)
HEMOGLOBIN: 8.5 g/dL — AB (ref 13.0–17.0)
MCH: 26.7 pg (ref 26.0–34.0)
MCHC: 32.8 g/dL (ref 30.0–36.0)
MCV: 81.4 fL (ref 78.0–100.0)
Platelets: 213 10*3/uL (ref 150–400)
RBC: 3.18 MIL/uL — ABNORMAL LOW (ref 4.22–5.81)
RDW: 18.6 % — ABNORMAL HIGH (ref 11.5–15.5)
WBC: 7.5 10*3/uL (ref 4.0–10.5)

## 2015-05-19 LAB — GLUCOSE, CAPILLARY
Glucose-Capillary: 119 mg/dL — ABNORMAL HIGH (ref 65–99)
Glucose-Capillary: 122 mg/dL — ABNORMAL HIGH (ref 65–99)
Glucose-Capillary: 135 mg/dL — ABNORMAL HIGH (ref 65–99)
Glucose-Capillary: 206 mg/dL — ABNORMAL HIGH (ref 65–99)

## 2015-05-19 LAB — HEPATITIS PANEL, ACUTE
HCV AB: 0.1 {s_co_ratio} (ref 0.0–0.9)
Hep A IgM: NEGATIVE
Hep B C IgM: NEGATIVE
Hepatitis B Surface Ag: NEGATIVE

## 2015-05-19 LAB — HEMOGLOBIN A1C
Hgb A1c MFr Bld: 6.9 % — ABNORMAL HIGH (ref 4.8–5.6)
Mean Plasma Glucose: 151 mg/dL

## 2015-05-19 LAB — HAPTOGLOBIN: HAPTOGLOBIN: 118 mg/dL (ref 34–200)

## 2015-05-19 MED ORDER — POTASSIUM CHLORIDE CRYS ER 20 MEQ PO TBCR
40.0000 meq | EXTENDED_RELEASE_TABLET | Freq: Once | ORAL | Status: AC
  Administered 2015-05-19: 40 meq via ORAL
  Filled 2015-05-19: qty 2

## 2015-05-19 MED ORDER — CEFAZOLIN SODIUM-DEXTROSE 2-3 GM-% IV SOLR
2.0000 g | INTRAVENOUS | Status: AC
  Filled 2015-05-19: qty 50

## 2015-05-19 NOTE — Plan of Care (Signed)
Problem: Consults Goal: Chest Pain Patient Education (See Patient Education module for education specifics.)  Outcome: Progressing Cardiology Consulted

## 2015-05-19 NOTE — Progress Notes (Signed)
[facility]                                                                              Patient Demographics  [name], is a 82 y.o. male, DOB - 03-21-1931, [id number]  Admit date - 05/17/2015   Admitting Physician Vianne Bulls, MD  Outpatient Primary MD for the patient is [name], MD  LOS -   days    Chief Complaint  Patient presents with  . Chest Pain       Brief HPI   Patient is 82 year old male with PUD, lower GI bleed, CAD status post CABG, ischemic cardiomyopathy, insulin-dependent diabetes, chronic kidney disease stage IV presented to ED with pain in the lower chest and epigastric 3 days after being discharged from the hospital.  Patient was admitted from 05/10/2015 - 05/14/2015 with lower GI bleed requiring transfusion. He was discharged home in much improved and stable condition, and initially did quite well. NAD patient was afebrile, vital signs were stable. Initial blood work showed serum creatinine of 3.31 up from 2.55 at the time of discharge. Troponin 0.09, BNP elevated at 509. Hemoglobin is 8.6, down from 9.4. Patient was admitted for further workup.  Interim summary Patient was admitted for lower chest/epigastric pain. Cleared by cardiology, no further cardiac workup. However patient was found to have significant transaminitis. Abdominal ultrasound showed multiple gallstones, no acute cholecystitis, may have had passed a transient CBD stone. Surgery consulted, possible cholecystectomy in a.m.  Assessment & Plan   Chest pain/lower epigastric atypical: Likely was due to passed a CBD stone/cholelithiasis - Currently resolved, Initial trop is 0.09-> 0.08 most likely baseline given his cardiac history and renal failure  - EKG without convincing features for acute ischemia  - All the anticoagulants including aspirin, Plavix, heparin subcutaneous has been held. Per cardiology okay to start aspirin.  - Cardiology consult  called, recommended continue current therapy for CAD with aspirin, beta blocker and Imdur, no ACE/ARB due to renal insufficiency. No further cardiac workup. - Hold statin secondary to transaminitis  Transaminitis: Likely due to cholelithiasis, may have passed a CBD stone- improving -Hold statins, hepatitis panel negative - Abdominal ultrasound showed multiple small gallstones, no acute cholecystitis - Surgery consult obtained, recommending NPO after MN for possible cholecystectomy in a.m.  - Requested cardiology for clearance as well.  Acute on Chronic Anemia  - Most likely multifactorial to include CKD, slow blood loss from diverticulosis - Hgb 8.6 on arrival, down from 9.3 three days prior, to patient received 1 unit packed , H&H improved to 9.1.  -Continue PPI  - Plavix was discontinued during the previous admission.   DM Type 2 uncontrolled renal complications -  [id number]  7.7%  - Continue Lantus, sliding scale insulin  Chronic systolic and diastolic CHF/ischemic cardiomyopathy  - TTE (11/07/2014) EF 40-45%, mild MR, akinetic inferior aspect  - 3/19 transfused 1 unit PRBC  Acute on chronic renal failure (Cr baseline~ 2.5- 3.3) unclear etiology:  - Creatinine holding up at 3.1, may be the new baseline, up from 2.55 at time of discharge   - Avoiding nephrotoxins, hold ACE/ARB   Pedal  edema -Most likely multifactorial to include ischemic cardiomyopathy and acute on chronic renal failure -TED hose  Code Status: Full code  Family Communication: Discussed in detail with the patient, all imaging results, lab results explained to the patient and patient's son, [name] on the phone  Disposition Plan:   Time Spent in minutes   25 minutes  Procedures  Abdominal ultrasound  Consults   Cardiology Gen. surgery  DVT Prophylaxis   SCD's  Medications  Scheduled Meds: . sodium chloride   Intravenous Once  . amLODipine  10 mg Oral Daily  . carvedilol  25 mg Oral BID WC   . cloNIDine  0.1 mg Oral QHS  . insulin aspart  0-15 Units Subcutaneous TID WC  . insulin glargine  8 Units Subcutaneous QHS  . isosorbide mononitrate  60 mg Oral Daily  . latanoprost  1 drop Both Eyes QHS  . pantoprazole  40 mg Oral BID  . pentoxifylline  400 mg Oral BID  . sodium chloride flush  3 mL Intravenous Q12H  . timolol  1 drop Both Eyes q morning - 10a   Continuous Infusions:  PRN Meds:.sodium chloride, acetaminophen, acetaminophen, HYDROcodone-acetaminophen, nitroGLYCERIN, ondansetron (ZOFRAN) IV, sodium chloride flush, zolpidem   Antibiotics   Anti-infectives    None        Subjective:   Juliocesar Nauta was seen and examined today. No complaints per patient today. No abdominal pain or lower chest pain. LFTs improving. No fevers or chills, diet tolerated. Patient denies dizziness, N/V/D/C, new weakness, numbess, tingling. No acute events overnight.    Objective:   Filed Vitals:   05/19/15 0041 05/19/15 0500 05/19/15 0752 05/19/15 1221  BP: 122/52 133/64 130/66 128/64  Pulse: 76 74 76 74  Temp: 98.9 F (37.2 C) 100 F (37.8 C) 98.9 F (37.2 C) 98.7 F (37.1 C)  TempSrc: Oral Oral Oral Oral  Resp:   18 18  Height:      Weight:  75.479 kg (166 lb 6.4 oz)    SpO2: 95% 97% 98% 97%    Intake/Output Summary (Last 24 hours) at 05/19/15 1316 Last data filed at 05/19/15 0850  Gross per 24 hour  Intake    240 ml  Output    325 ml  Net    -85 ml     Wt Readings from Last 3 Encounters:  05/19/15 75.479 kg (166 lb 6.4 oz)  05/14/15 77.7 kg (171 lb 4.8 oz)  04/22/15 73.936 kg (163 lb)     Exam  General: Alert and oriented x 3, NAD  HEENT:    Neck: Supple, no JVD, no masses  CVS: S1 S2 clear, regular rate and rhythm  Respiratory: CTAB, no wheezing or rhonchi  Abdomen: Soft, nontender, nondistended, + bowel sounds  Ext: no cyanosis clubbing or edema  Neuro: no new deficits  Skin: No rashes  Psych: Normal affect and demeanor, alert and  oriented x3    Data Reviewed:  I have personally reviewed following labs and imaging studies  Micro Results Recent Results (from the past 240 hour(s))  MRSA PCR Screening     Status: None   Collection Time: 05/10/15  4:21 PM  Result Value Ref Range Status   MRSA by PCR NEGATIVE NEGATIVE Final    Comment:        The GeneXpert MRSA Assay (FDA approved for NASAL specimens only), is one component of a comprehensive MRSA colonization surveillance program. It is not intended to diagnose MRSA infection nor to guide  or monitor treatment for MRSA infections.     Radiology Reports Dg Chest 2 View  05/17/2015  CLINICAL DATA:  Chest abdominal pain all day. Recent discharge from the hospital for GI bleed. EXAM: CHEST  2 VIEW COMPARISON:  12/29/2014. FINDINGS: Unchanged linear basilar opacities, likely due to scarring. The lungs are otherwise clear. There is no pleural effusion. Hilar and mediastinal contours are unremarkable and unchanged. Borderline cardiomegaly, unchanged. There is prior sternotomy and CABG. IMPRESSION: Unchanged cardiomegaly. Linear basilar scarring. No acute cardiopulmonary findings. Electronically Signed   By: Andreas Newport M.D.   On: 05/17/2015 02:56   Nm Gi Blood Loss  05/11/2015  CLINICAL DATA:  Patient with hematochezia. EXAM: NUCLEAR MEDICINE GASTROINTESTINAL BLEEDING SCAN TECHNIQUE: Sequential abdominal images were obtained following intravenous administration of Tc-55m labeled red blood cells. RADIOPHARMACEUTICALS:  25.3 mCi Tc-27m in-vitro labeled red cells. COMPARISON:  Renal ultrasound 11/14/2014 FINDINGS: Radiotracer is demonstrated coursing within the left lower quadrant towards the rectum, likely emanating from the sigmoid colon. Radiotracer is demonstrated within the blood pool. IMPRESSION: Findings compatible with acute GI bleed, likely emanating from the sigmoid colon. Critical Value/emergent results were called by telephone at the time of interpretation on  05/11/2015 at 12:45 pm to Dr. Azucena Freed , who verbally acknowledged these results. Electronically Signed   By: Lovey Newcomer M.D.   On: 05/11/2015 12:58   US Abdomen Complete  05/18/2015  CLINICAL DATA:  Elevated liver function tests EXAM: ABDOMEN ULTRASOUND COMPLETE COMPARISON:  None. FINDINGS: Gallbladder: Multiple small mobile stones the largest measuring 9 mm with. No Murphy sign or wall thickening. Common bile duct: Diameter: 2 mm Liver: No focal lesion identified. Within normal limits in parenchymal echogenicity. IVC: No abnormality visualized. Pancreas: Visualized portion unremarkable. Spleen: Size and appearance within normal limits. Right Kidney: Length: 9.6 cm. No significant abnormalities. Several small cysts the largest measuring 17 mm. Left Kidney: Length: 10.2 cm. Echogenicity within normal limits. No mass or hydronephrosis visualized. Abdominal aorta: Distal aorta and bifurcation not visualized. Proximal to mid aorta show no dilatation. There is atherosclerotic calcification. Other findings: No ascites IMPRESSION: No acute findings.  Cholelithiasis with no Murphy sign. Electronically Signed   By: Skipper Cliche M.D.   On: 05/18/2015 18:34    CBC  Recent Labs Lab 05/14/15 0619 05/17/15 0224 05/17/15 1236 05/17/15 2329 05/18/15 0552 05/18/15 1150 05/19/15 0314  WBC 7.5 8.1  --   --  8.1  --  7.5  HGB 9.3* 8.6* 8.0* 9.1* 8.8* 9.0* 8.5*  HCT 27.5* 26.5* 24.6* 28.4* 25.6* 27.1* 25.9*  PLT 163 192  --   --  198  --  213  MCV 83.1 84.9  --   --  81.5  --  81.4  MCH 28.1 27.6  --   --  28.0  --  26.7  MCHC 33.8 32.5  --   --  34.4  --  32.8  RDW 17.0* 17.6*  --   --  18.7*  --  18.6*  LYMPHSABS  --   --   --   --  0.8  --   --   MONOABS  --   --   --   --  0.5  --   --   EOSABS  --   --   --   --  0.2  --   --   BASOSABS  --   --   --   --  0.0  --   --  Chemistries   Recent Labs Lab 05/14/15 0619 05/17/15 0224 05/18/15 0552 05/19/15 0314  NA 144 141 141 140  K 4.4  4.1 3.2* 3.2*  CL 116* 108 107 107  CO2 20* 21* 22 22  GLUCOSE 156* 202* 125* 118*  BUN 23* 34* 37* 40*  CREATININE 2.55* 3.31* 3.13* 3.10*  CALCIUM 8.9 9.3 8.9 9.0  MG  --   --  1.7  --   AST 19  --  339* 153*  ALT 13*  --  493* 340*  ALKPHOS 48  --  210* 176*  BILITOT 0.5  --  1.4* 0.7   ------------------------------------------------------------------------------------------------------------------ estimated creatinine clearance is 19.9 mL/min (by C-G formula based on Cr of 3.1). ------------------------------------------------------------------------------------------------------------------  Recent Labs  05/17/15 2329  HGBA1C 6.9*   ------------------------------------------------------------------------------------------------------------------  Recent Labs  05/17/15 2329  CHOL 81  HDL 22*  LDLCALC 37  TRIG 111  CHOLHDL 3.7   ------------------------------------------------------------------------------------------------------------------ No results for input(s): TSH, T4TOTAL, T3FREE, THYROIDAB in the last 72 hours.  Invalid input(s): FREET3 ------------------------------------------------------------------------------------------------------------------  Recent Labs  05/17/15 2329  VITAMINB12 895  FOLATE 22.2  FERRITIN 27  TIBC 402  IRON 44*  RETICCTPCT 2.1    Coagulation profile  Recent Labs Lab 05/17/15 0224  INR 1.18    No results for input(s): DDIMER in the last 72 hours.  Cardiac Enzymes  Recent Labs Lab 05/17/15 0705 05/17/15 1535 05/17/15 2329  TROPONINI 0.06* 0.08* 0.08*   ------------------------------------------------------------------------------------------------------------------ Invalid input(s): POCBNP   Recent Labs  05/18/15 0719 05/18/15 1135 05/18/15 1625 05/18/15 2125 05/19/15 0725 05/19/15 1143  GLUCAP 116* 134* 114* 179* 119* 74*     RAI,RIPUDEEP M.D. [facility] 05/19/2015, 1:16 PM  Pager:  [phone] Between 7am to 7pm - call Pager - [phone]  After 7pm go to [email or web address] - password [email or web address]  Call night coverage person covering after 7pm

## 2015-05-19 NOTE — Consult Note (Signed)
Reason for Consult: gallstones  Referring Physician: Dr. Estill Cotta   HPI: [name] is a 82 year old male with a history of recent GI bleed subsequently taken off plavix, CAD s/p CABG, ICM, DM, CKD who presented on Sunday with sudden onset epigastric abdominal pain associated with nausea.  Denies fever, chills or sweats.  Denies diarrhea.  Denies previous symptoms.  Cardiac etiology has been ruled out.  He was found to have elevated LFTs with a t bilirubin 1.4.  His pain resolved on Sunday night.  LFTs today showed improving LFTs with t bilirubin .7.  Abdominal US revealed stones, with CBD at 41m.  We have been asked to evaluate for cholelithiasis.  At present time, the patient is tolerating POs, afebrile and symptom free.   Past Medical History  Diagnosis Date  . CAD (coronary artery disease)     a. H/o MI in 1998;  b. s/p CABG 2003. c. 10/2014 NSTEMI/Cath: LM 75, LAD 103m, D1 95, D2 50, LCX 100ost/p, OM1 80, OM2 80, RCA 100p/m, RPDA fills via L->L collats, LIMA->LAD ok, VG->dRCA 100, VG->OM1->OM2 99 prox to OM1 insertion->recanalated ->Tx with heparin,  VG->D1 100-->Med Rx.  . Marland Kitchenyperlipidemia   . Hypertensive heart disease   . Type II diabetes mellitus ([place]  . History of stomach ulcers 2013  . Arthritis     "touch in my fingers" (09/10/2014)  . B12 deficiency anemia     "stopped taking the shots in ~ 06/2014" (09/10/2014)  . Diabetes 1.5, managed as type 1 ([facility]  . CKD (chronic kidney disease), stage IV ([facility]  . Chronic systolic CHF (congestive heart failure) (HCC)     a. EF 40-45% in 10/2014 (previously normal in 08/2014).  . Ischemic cardiomyopathy     a. 10/2014 Echo: EF 40-45%.  . Marland KitchenVD [facility]peripheral vascular disease) [facility]    Past Surgical History  Procedure Laterality Date  . Esophagogastroduodenoscopy  06/10/2011    Procedure: ESOPHAGOGASTRODUODENOSCOPY (EGD);  Surgeon: MaLadene ArtistMD,FACG;  [facility];  Service: Endoscopy;  Laterality: N/A;  . Coronary  artery bypass graft  2003    CABG X5  . Cardiac catheterization  2003;   . Coronary angioplasty  1998    /nArchie Endo/15/2012  . Cataract extraction, bilateral Bilateral   . Cardiac catheterization N/A 11/07/2014    Left Heart Cath; HeBelva CromeMD;   . Colonoscopy    . Colonoscopy N/A 05/12/2015    Procedure: COLONOSCOPY;  Surgeon: DaMilus BanisterMD;  [facility] Service: [facility];  Laterality: N/A;    Family History  Problem Relation Age of Onset  . Diabetes      brothers and sisters  . Hypertension Sister   . Coronary artery disease    . Stroke Mother   . Hypertension Mother   . Cancer Mother   . Heart attack Sister     Social History:  reports that he has quit smoking. His smoking use included Cigarettes. He has a 10 pack-year smoking history. He has never used smokeless tobacco. He reports that he does not drink alcohol or use illicit drugs.  Allergies: No Known Allergies  Medications:  Scheduled Meds: . sodium chloride   Intravenous Once  . amLODipine  10 mg Oral Daily  . carvedilol  25 mg Oral BID WC  . cloNIDine  0.1 mg Oral QHS  . insulin aspart  0-15 Units Subcutaneous TID WC  . insulin glargine  8 Units Subcutaneous QHS  .  isosorbide mononitrate  60 mg Oral Daily  . latanoprost  1 drop Both Eyes QHS  . pantoprazole  40 mg Oral BID  . pentoxifylline  400 mg Oral BID  . sodium chloride flush  3 mL Intravenous Q12H  . timolol  1 drop Both Eyes q morning - 10a   Continuous Infusions:  PRN Meds:.sodium chloride, acetaminophen, acetaminophen, HYDROcodone-acetaminophen, nitroGLYCERIN, ondansetron (ZOFRAN) IV, sodium chloride flush, zolpidem   Results for orders placed or performed during the hospital encounter of 05/17/15 (from the past 48 hour(s))  Hemoglobin and hematocrit, blood     Status: Abnormal   Collection Time: 05/17/15 12:36 PM  Result Value Ref Range   Hemoglobin 8.0 (L) 13.0 - 17.0 g/dL   HCT 24.6 (L) 39.0 - 52.0 %  Troponin I      Status: Abnormal   Collection Time: 05/17/15  3:35 PM  Result Value Ref Range   Troponin I 0.08 (H) <0.031 ng/mL    Comment:        PERSISTENTLY INCREASED TROPONIN VALUES IN THE RANGE OF 0.04-0.49 ng/mL CAN BE SEEN IN:       -UNSTABLE ANGINA       -CONGESTIVE HEART FAILURE       -MYOCARDITIS       -CHEST TRAUMA       -ARRYHTHMIAS       -LATE PRESENTING MYOCARDIAL INFARCTION       -COPD   CLINICAL FOLLOW-UP RECOMMENDED.   Glucose, capillary     Status: Abnormal   Collection Time: 05/17/15  4:53 PM  Result Value Ref Range   Glucose-Capillary 162 (H) 65 - 99 mg/dL  Urinalysis, Routine w reflex microscopic (not at [facility])     Status: Abnormal   Collection Time: 05/17/15  5:47 PM  Result Value Ref Range   Color, Urine YELLOW YELLOW   APPearance CLEAR CLEAR   Specific Gravity, Urine 1.015 1.005 - 1.030   pH 5.0 5.0 - 8.0   Glucose, UA NEGATIVE NEGATIVE mg/dL   Hgb urine dipstick NEGATIVE NEGATIVE   Bilirubin Urine SMALL (A) NEGATIVE   Ketones, ur NEGATIVE NEGATIVE mg/dL   Protein, ur 30 (A) NEGATIVE mg/dL   Nitrite NEGATIVE NEGATIVE   Leukocytes, UA NEGATIVE NEGATIVE  Urine microscopic-add on     Status: Abnormal   Collection Time: 05/17/15  5:47 PM  Result Value Ref Range   Squamous Epithelial / LPF 0-5 (A) NONE SEEN   WBC, UA 0-5 0 - 5 WBC/hpf   RBC / HPF 0-5 0 - 5 RBC/hpf   Bacteria, UA RARE (A) NONE SEEN   Casts HYALINE CASTS (A) NEGATIVE  Prepare RBC     Status: None   Collection Time: 05/17/15  6:30 PM  Result Value Ref Range   Order Confirmation ORDER PROCESSED BY BLOOD BANK   Glucose, capillary     Status: Abnormal   Collection Time: 05/17/15  9:02 PM  Result Value Ref Range   Glucose-Capillary 147 (H) 65 - 99 mg/dL  Troponin I     Status: Abnormal   Collection Time: 05/17/15 11:29 PM  Result Value Ref Range   Troponin I 0.08 (H) <0.031 ng/mL    Comment:        PERSISTENTLY INCREASED TROPONIN VALUES IN THE RANGE OF 0.04-0.49 ng/mL CAN BE SEEN IN:        -UNSTABLE ANGINA       -CONGESTIVE HEART FAILURE       -MYOCARDITIS       -CHEST  TRAUMA       -ARRYHTHMIAS       -LATE PRESENTING MYOCARDIAL INFARCTION       -COPD   CLINICAL FOLLOW-UP RECOMMENDED.   Hemoglobin and hematocrit, blood     Status: Abnormal   Collection Time: 05/17/15 11:29 PM  Result Value Ref Range   Hemoglobin 9.1 (L) 13.0 - 17.0 g/dL   HCT 28.4 (L) 39.0 - 52.0 %  Vitamin B12     Status: None   Collection Time: 05/17/15 11:29 PM  Result Value Ref Range   Vitamin B-12 895 180 - 914 pg/mL    Comment: (NOTE) This assay is not validated for testing neonatal or myeloproliferative syndrome specimens for Vitamin B12 levels.   Folate     Status: None   Collection Time: 05/17/15 11:29 PM  Result Value Ref Range   Folate 22.2 >5.9 ng/mL  Iron and TIBC     Status: Abnormal   Collection Time: 05/17/15 11:29 PM  Result Value Ref Range   Iron 44 (L) 45 - 182 ug/dL   TIBC 402 250 - 450 ug/dL   Saturation Ratios 11 (L) 17.9 - 39.5 %   UIBC 358 ug/dL  Ferritin     Status: None   Collection Time: 05/17/15 11:29 PM  Result Value Ref Range   Ferritin 27 24 - 336 ng/mL  Reticulocytes     Status: Abnormal   Collection Time: 05/17/15 11:29 PM  Result Value Ref Range   Retic Ct Pct 2.1 0.4 - 3.1 %   RBC. 3.35 (L) 4.22 - 5.81 MIL/uL   Retic Count, Manual 70.4 19.0 - 186.0 K/uL  Haptoglobin     Status: None   Collection Time: 05/17/15 11:29 PM  Result Value Ref Range   Haptoglobin 118 34 - 200 mg/dL    Comment: (NOTE) Performed At: [facility] [place], [place] [id number] Lindon Romp MD [id number]   Lactate dehydrogenase     Status: Abnormal   Collection Time: 05/17/15 11:29 PM  Result Value Ref Range   LDH 498 (H) 98 - 192 U/L  Hemoglobin A1c     Status: Abnormal   Collection Time: 05/17/15 11:29 PM  Result Value Ref Range   Hgb A1c MFr Bld 6.9 (H) 4.8 - 5.6 %    Comment: (NOTE)         Pre-diabetes: 5.7 - 6.4         Diabetes:  >6.4         Glycemic control for adults with diabetes: <7.0    Mean Plasma Glucose 151 mg/dL    Comment: (NOTE) Performed At: [facility] [place], [place] [id number] Lindon Romp MD OA:[id number]   Lipid panel     Status: Abnormal   Collection Time: 05/17/15 11:29 PM  Result Value Ref Range   Cholesterol 81 0 - 200 mg/dL   Triglycerides 111 <150 mg/dL   HDL 22 (L) >40 mg/dL   Total CHOL/HDL Ratio 3.7 RATIO   VLDL 22 0 - 40 mg/dL   LDL Cholesterol 37 0 - 99 mg/dL    Comment:        Total Cholesterol/HDL:CHD Risk Coronary Heart Disease Risk Table                     Men   Women  1/2 Average Risk   3.4   3.3  Average Risk       5.0   4.4  2 X Average Risk   9.6   7.1  3 X Average Risk  23.4   11.0        Use the calculated Patient Ratio above and the CHD Risk Table to determine the patient's CHD Risk.        ATP III CLASSIFICATION (LDL):  <100     mg/dL   Optimal  100-129  mg/dL   Near or Above                    Optimal  130-159  mg/dL   Borderline  160-189  mg/dL   High  >190     mg/dL   Very High   Comprehensive metabolic panel     Status: Abnormal   Collection Time: 05/18/15  5:52 AM  Result Value Ref Range   Sodium 141 135 - 145 mmol/L   Potassium 3.2 (L) 3.5 - 5.1 mmol/L   Chloride 107 101 - 111 mmol/L   CO2 22 22 - 32 mmol/L   Glucose, Bld 125 (H) 65 - 99 mg/dL   BUN 37 (H) 6 - 20 mg/dL   Creatinine, Ser 3.13 (H) 0.61 - 1.24 mg/dL   Calcium 8.9 8.9 - 10.3 mg/dL   Total Protein 6.0 (L) 6.5 - 8.1 g/dL   Albumin 3.2 (L) 3.5 - 5.0 g/dL   AST 339 (H) 15 - 41 U/L   ALT 493 (H) 17 - 63 U/L   Alkaline Phosphatase 210 (H) 38 - 126 U/L   Total Bilirubin 1.4 (H) 0.3 - 1.2 mg/dL   GFR calc non Af Amer 17 (L) >60 mL/min   GFR calc Af Amer 20 (L) >60 mL/min    Comment: (NOTE) The eGFR has been calculated using the CKD EPI equation. This calculation has not been validated in all clinical situations. eGFR's persistently <60 mL/min signify  possible Chronic Kidney Disease.    Anion gap 12 5 - 15  Magnesium     Status: None   Collection Time: 05/18/15  5:52 AM  Result Value Ref Range   Magnesium 1.7 1.7 - 2.4 mg/dL  CBC with Differential/Platelet     Status: Abnormal   Collection Time: 05/18/15  5:52 AM  Result Value Ref Range   WBC 8.1 4.0 - 10.5 K/uL   RBC 3.14 (L) 4.22 - 5.81 MIL/uL   Hemoglobin 8.8 (L) 13.0 - 17.0 g/dL   HCT 25.6 (L) 39.0 - 52.0 %   MCV 81.5 78.0 - 100.0 fL   MCH 28.0 26.0 - 34.0 pg   MCHC 34.4 30.0 - 36.0 g/dL   RDW 18.7 (H) 11.5 - 15.5 %   Platelets 198 150 - 400 K/uL   Neutrophils Relative % 81 %   Neutro Abs 6.5 1.7 - 7.7 K/uL   Lymphocytes Relative 10 %   Lymphs Abs 0.8 0.7 - 4.0 K/uL   Monocytes Relative 7 %   Monocytes Absolute 0.5 0.1 - 1.0 K/uL   Eosinophils Relative 2 %   Eosinophils Absolute 0.2 0.0 - 0.7 K/uL   Basophils Relative 0 %   Basophils Absolute 0.0 0.0 - 0.1 K/uL  Glucose, capillary     Status: Abnormal   Collection Time: 05/18/15  7:19 AM  Result Value Ref Range   Glucose-Capillary 116 (H) 65 - 99 mg/dL  Glucose, capillary     Status: Abnormal   Collection Time: 05/18/15 11:35 AM  Result Value Ref Range   Glucose-Capillary 134 (H) 65 - 99 mg/dL  Hemoglobin and hematocrit, blood     Status: Abnormal   Collection Time: 05/18/15 11:50 AM  Result Value Ref Range   Hemoglobin 9.0 (L) 13.0 - 17.0 g/dL   HCT 27.1 (L) 39.0 - 52.0 %  Hepatitis panel, acute     Status: None   Collection Time: 05/18/15 11:50 AM  Result Value Ref Range   Hepatitis B Surface Ag Negative Negative   HCV Ab 0.1 0.0 - 0.9 s/co ratio    Comment: (NOTE)                                  Negative:     < 0.8                             Indeterminate: 0.8 - 0.9                                  Positive:     > 0.9 The CDC recommends that a positive HCV antibody result be followed up with a HCV Nucleic Acid Amplification test (132440). Performed At: [facility] [place], [place] [id number] Lindon Romp MD [id number]    Hep A IgM Negative Negative   Hep B C IgM Negative Negative  Glucose, capillary     Status: Abnormal   Collection Time: 05/18/15  4:25 PM  Result Value Ref Range   Glucose-Capillary 114 (H) 65 - 99 mg/dL  Glucose, capillary     Status: Abnormal   Collection Time: 05/18/15  9:25 PM  Result Value Ref Range   Glucose-Capillary 179 (H) 65 - 99 mg/dL  Comprehensive metabolic panel     Status: Abnormal   Collection Time: 05/19/15  3:14 AM  Result Value Ref Range   Sodium 140 135 - 145 mmol/L   Potassium 3.2 (L) 3.5 - 5.1 mmol/L   Chloride 107 101 - 111 mmol/L   CO2 22 22 - 32 mmol/L   Glucose, Bld 118 (H) 65 - 99 mg/dL   BUN 40 (H) 6 - 20 mg/dL   Creatinine, Ser 3.10 (H) 0.61 - 1.24 mg/dL   Calcium 9.0 8.9 - 10.3 mg/dL   Total Protein 5.6 (L) 6.5 - 8.1 g/dL   Albumin 3.1 (L) 3.5 - 5.0 g/dL   AST 153 (H) 15 - 41 U/L   ALT 340 (H) 17 - 63 U/L   Alkaline Phosphatase 176 (H) 38 - 126 U/L   Total Bilirubin 0.7 0.3 - 1.2 mg/dL   GFR calc non Af Amer 17 (L) >60 mL/min   GFR calc Af Amer 20 (L) >60 mL/min    Comment: (NOTE) The eGFR has been calculated using the CKD EPI equation. This calculation has not been validated in all clinical situations. eGFR's persistently <60 mL/min signify possible Chronic Kidney Disease.    Anion gap 11 5 - 15  CBC     Status: Abnormal   Collection Time: 05/19/15  3:14 AM  Result Value Ref Range   WBC 7.5 4.0 - 10.5 K/uL   RBC 3.18 (L) 4.22 - 5.81 MIL/uL   Hemoglobin 8.5 (L) 13.0 - 17.0 g/dL   HCT 25.9 (L) 39.0 - 52.0 %   MCV 81.4 78.0 - 100.0 fL   MCH 26.7 26.0 - 34.0 pg   MCHC  32.8 30.0 - 36.0 g/dL   RDW 18.6 (H) 11.5 - 15.5 %   Platelets 213 150 - 400 K/uL  Glucose, capillary     Status: Abnormal   Collection Time: 05/19/15  7:25 AM  Result Value Ref Range   Glucose-Capillary 119 (H) 65 - 99 mg/dL  Glucose, capillary     Status: Abnormal   Collection Time: 05/19/15 11:43 AM   Result Value Ref Range   Glucose-Capillary 206 (H) 65 - 99 mg/dL    US Abdomen Complete  05/18/2015  CLINICAL DATA:  Elevated liver function tests EXAM: ABDOMEN ULTRASOUND COMPLETE COMPARISON:  None. FINDINGS: Gallbladder: Multiple small mobile stones the largest measuring 9 mm with. No Murphy sign or wall thickening. Common bile duct: Diameter: 2 mm Liver: No focal lesion identified. Within normal limits in parenchymal echogenicity. IVC: No abnormality visualized. Pancreas: Visualized portion unremarkable. Spleen: Size and appearance within normal limits. Right Kidney: Length: 9.6 cm. No significant abnormalities. Several small cysts the largest measuring 17 mm. Left Kidney: Length: 10.2 cm. Echogenicity within normal limits. No mass or hydronephrosis visualized. Abdominal aorta: Distal aorta and bifurcation not visualized. Proximal to mid aorta show no dilatation. There is atherosclerotic calcification. Other findings: No ascites IMPRESSION: No acute findings.  Cholelithiasis with no Murphy sign. Electronically Signed   By: Skipper Cliche M.D.   On: 05/18/2015 18:34    Review of Systems  Constitutional: Negative for fever, chills, weight loss, malaise/fatigue and diaphoresis.  Eyes: Negative for blurred vision, double vision, photophobia, pain, discharge and redness.  Respiratory: Positive for shortness of breath. Negative for cough, hemoptysis, sputum production and wheezing.   Cardiovascular: Negative for chest pain, palpitations, orthopnea, claudication, leg swelling and PND.  Gastrointestinal: Negative for heartburn, nausea, vomiting, abdominal pain, diarrhea, constipation, blood in stool and melena.  Genitourinary: Negative for dysuria, urgency, frequency, hematuria and flank pain.  Musculoskeletal: Negative for myalgias, back pain, joint pain, falls and neck pain.  Neurological: Negative for dizziness, tingling, tremors, sensory change, speech change, focal weakness, seizures, loss of  consciousness and weakness.   Blood pressure 130/66, pulse 76, temperature 98.9 F (37.2 C), temperature source Oral, resp. rate 18, height 5' 11"  (1.803 m), weight 75.479 kg (166 lb 6.4 oz), SpO2 98 %. Physical Exam  Constitutional: He is oriented to person, place, and time. He appears well-developed and well-nourished. No distress.  Cardiovascular: Normal rate, regular rhythm, normal heart sounds and intact distal pulses.  Exam reveals no gallop.   No murmur heard. Respiratory: Effort normal and breath sounds normal. No respiratory distress. He has no wheezes. He has no rales. He exhibits no tenderness.  GI: Soft. Bowel sounds are normal. He exhibits no distension and no mass. There is no tenderness. There is no rebound and no guarding.  Musculoskeletal: Normal range of motion. He exhibits no edema or tenderness.  Neurological: He is alert and oriented to person, place, and time.  Skin: Skin is warm and dry. He is not diaphoretic.  Psychiatric: He has a normal mood and affect. His behavior is normal. Judgment and thought content normal.    Assessment/Plan: 82 year old male hx of LGIB, anemia, CHF, ICM, CAD, CKD admitted 3//19 with atypical chest pain Cholelithiasis  Abnormal LFTs  May have passed a CBD stone, LFTs are improving, CBD 67m in diameter on [place]  Would recommend proceeding with a laparoscopic cholecystectomy.  Will ask cardiology to stratify cardiac risk assessment.  NPO after midnight.  Possible cholecystectomy tomorrow, but more realistically on Thursday.  Thank you for the consult.  Final recommendations to follow pending cardiology evaluation.   Erby Pian ANP-BC Pager [phone] 05/19/2015, 11:52 AM

## 2015-05-19 NOTE — Progress Notes (Signed)
Subjective: PT denies CP  No SOB Objective: Filed Vitals:   05/19/15 0041 05/19/15 0500 05/19/15 0752 05/19/15 1221  BP: 122/52 133/64 130/66 128/64  Pulse: 76 74 76 74  Temp: 98.9 F (37.2 C) 100 F (37.8 C) 98.9 F (37.2 C) 98.7 F (37.1 C)  TempSrc: Oral Oral Oral Oral  Resp:   18 18  Height:      Weight:  166 lb 6.4 oz (75.479 kg)    SpO2: 95% 97% 98% 97%   Weight change: -5 lb (-2.268 kg)  Intake/Output Summary (Last 24 hours) at 05/19/15 1532 Last data filed at 05/19/15 1354  Gross per 24 hour  Intake    480 ml  Output    325 ml  Net    155 ml    General: Alert, awake, oriented x3, in no acute distress Neck:  JVP is normal Heart: Regular rate and rhythm, without murmurs, rubs, gallops.  Lungs: Clear to auscultation.  No rales or wheezes. Exemities:  No edema.   Neuro: Grossly intact, nonfocal.   Lab Results: Results for orders placed or performed during the hospital encounter of 05/17/15 (from the past 24 hour(s))  Glucose, capillary     Status: Abnormal   Collection Time: 05/18/15  4:25 PM  Result Value Ref Range   Glucose-Capillary 114 (H) 65 - 99 mg/dL  Glucose, capillary     Status: Abnormal   Collection Time: 05/18/15  9:25 PM  Result Value Ref Range   Glucose-Capillary 179 (H) 65 - 99 mg/dL  Comprehensive metabolic panel     Status: Abnormal   Collection Time: 05/19/15  3:14 AM  Result Value Ref Range   Sodium 140 135 - 145 mmol/L   Potassium 3.2 (L) 3.5 - 5.1 mmol/L   Chloride 107 101 - 111 mmol/L   CO2 22 22 - 32 mmol/L   Glucose, Bld 118 (H) 65 - 99 mg/dL   BUN 40 (H) 6 - 20 mg/dL   Creatinine, Ser 3.10 (H) 0.61 - 1.24 mg/dL   Calcium 9.0 8.9 - 10.3 mg/dL   Total Protein 5.6 (L) 6.5 - 8.1 g/dL   Albumin 3.1 (L) 3.5 - 5.0 g/dL   AST 153 (H) 15 - 41 U/L   ALT 340 (H) 17 - 63 U/L   Alkaline Phosphatase 176 (H) 38 - 126 U/L   Total Bilirubin 0.7 0.3 - 1.2 mg/dL   GFR calc non Af Amer 17 (L) >60 mL/min   GFR calc Af Amer 20 (L) >60 mL/min     Anion gap 11 5 - 15  CBC     Status: Abnormal   Collection Time: 05/19/15  3:14 AM  Result Value Ref Range   WBC 7.5 4.0 - 10.5 K/uL   RBC 3.18 (L) 4.22 - 5.81 MIL/uL   Hemoglobin 8.5 (L) 13.0 - 17.0 g/dL   HCT 25.9 (L) 39.0 - 52.0 %   MCV 81.4 78.0 - 100.0 fL   MCH 26.7 26.0 - 34.0 pg   MCHC 32.8 30.0 - 36.0 g/dL   RDW 18.6 (H) 11.5 - 15.5 %   Platelets 213 150 - 400 K/uL  Glucose, capillary     Status: Abnormal   Collection Time: 05/19/15  7:25 AM  Result Value Ref Range   Glucose-Capillary 119 (H) 65 - 99 mg/dL  Glucose, capillary     Status: Abnormal   Collection Time: 05/19/15 11:43 AM  Result Value Ref Range   Glucose-Capillary 206 (H) 65 -  99 mg/dL    Studies/Results: US Abdomen Complete  05/18/2015  CLINICAL DATA:  Elevated liver function tests EXAM: ABDOMEN ULTRASOUND COMPLETE COMPARISON:  None. FINDINGS: Gallbladder: Multiple small mobile stones the largest measuring 9 mm with. No Murphy sign or wall thickening. Common bile duct: Diameter: 2 mm Liver: No focal lesion identified. Within normal limits in parenchymal echogenicity. IVC: No abnormality visualized. Pancreas: Visualized portion unremarkable. Spleen: Size and appearance within normal limits. Right Kidney: Length: 9.6 cm. No significant abnormalities. Several small cysts the largest measuring 17 mm. Left Kidney: Length: 10.2 cm. Echogenicity within normal limits. No mass or hydronephrosis visualized. Abdominal aorta: Distal aorta and bifurcation not visualized. Proximal to mid aorta show no dilatation. There is atherosclerotic calcification. Other findings: No ascites IMPRESSION: No acute findings.  Cholelithiasis with no Murphy sign. Electronically Signed   By: Skipper Cliche M.D.   On: 05/18/2015 18:34    Medications: Reviewed   @PROBHOSP @  1  CAD  Pt with known svere CAD  Last cath Sept 2016  Severe native vssel dz.  LIMA to LAD patent.  SVG to RCA occluded; SVG to D1 occluded SVGto OM recalaized  Rx wit  heparin  LIMA to LAD patent   LVEF 40 to 45% Pt currently in SR  No symptoms to sugg active ischemia He is at least at a moderate increased risk for cardiac comlication in the periop period.  I do not think it is prohibitive however.  Would recomm close f/u of BP, HR, Hbg through surgery and recovery periods.   WIll continue to follow      Dorris Carnes 05/19/2015, 3:32 PM

## 2015-05-20 ENCOUNTER — Observation Stay (HOSPITAL_COMMUNITY): Payer: Commercial Managed Care - HMO | Admitting: Anesthesiology

## 2015-05-20 ENCOUNTER — Encounter (HOSPITAL_COMMUNITY)
Admission: EM | Disposition: A | Discharge status: Home / Self Care | Payer: Self-pay | Source: Home / Self Care | Attending: Emergency Medicine

## 2015-05-20 DIAGNOSIS — R079 Chest pain, unspecified: Secondary | ICD-10-CM | POA: Diagnosis not present

## 2015-05-20 DIAGNOSIS — R1013 Epigastric pain: Secondary | ICD-10-CM | POA: Diagnosis not present

## 2015-05-20 DIAGNOSIS — Z8719 Personal history of other diseases of the digestive system: Secondary | ICD-10-CM | POA: Diagnosis not present

## 2015-05-20 DIAGNOSIS — Z794 Long term (current) use of insulin: Secondary | ICD-10-CM | POA: Diagnosis not present

## 2015-05-20 DIAGNOSIS — I25709 Atherosclerosis of coronary artery bypass graft(s), unspecified, with unspecified angina pectoris: Secondary | ICD-10-CM | POA: Diagnosis not present

## 2015-05-20 DIAGNOSIS — K8020 Calculus of gallbladder without cholecystitis without obstruction: Secondary | ICD-10-CM | POA: Diagnosis not present

## 2015-05-20 DIAGNOSIS — I2510 Atherosclerotic heart disease of native coronary artery without angina pectoris: Secondary | ICD-10-CM | POA: Diagnosis not present

## 2015-05-20 DIAGNOSIS — N184 Chronic kidney disease, stage 4 (severe): Secondary | ICD-10-CM | POA: Diagnosis not present

## 2015-05-20 DIAGNOSIS — K8010 Calculus of gallbladder with chronic cholecystitis without obstruction: Secondary | ICD-10-CM | POA: Diagnosis not present

## 2015-05-20 DIAGNOSIS — I255 Ischemic cardiomyopathy: Secondary | ICD-10-CM | POA: Diagnosis not present

## 2015-05-20 DIAGNOSIS — N179 Acute kidney failure, unspecified: Secondary | ICD-10-CM | POA: Diagnosis not present

## 2015-05-20 DIAGNOSIS — I25700 Atherosclerosis of coronary artery bypass graft(s), unspecified, with unstable angina pectoris: Secondary | ICD-10-CM | POA: Diagnosis not present

## 2015-05-20 DIAGNOSIS — I25810 Atherosclerosis of coronary artery bypass graft(s) without angina pectoris: Secondary | ICD-10-CM

## 2015-05-20 HISTORY — PX: CHOLECYSTECTOMY: SHX55

## 2015-05-20 LAB — COMPREHENSIVE METABOLIC PANEL
ALK PHOS: 157 U/L — AB (ref 38–126)
ALT: 241 U/L — AB (ref 17–63)
AST: 73 U/L — AB (ref 15–41)
Albumin: 3 g/dL — ABNORMAL LOW (ref 3.5–5.0)
Anion gap: 11 (ref 5–15)
BUN: 42 mg/dL — AB (ref 6–20)
CALCIUM: 9 mg/dL (ref 8.9–10.3)
CHLORIDE: 106 mmol/L (ref 101–111)
CO2: 21 mmol/L — ABNORMAL LOW (ref 22–32)
CREATININE: 3.09 mg/dL — AB (ref 0.61–1.24)
GFR calc Af Amer: 20 mL/min — ABNORMAL LOW (ref >60)
GFR, EST NON AFRICAN AMERICAN: 17 mL/min — AB (ref >60)
Glucose, Bld: 103 mg/dL — ABNORMAL HIGH (ref 65–99)
Potassium: 3.8 mmol/L (ref 3.5–5.1)
Sodium: 138 mmol/L (ref 135–145)
Total Bilirubin: 0.5 mg/dL (ref 0.3–1.2)
Total Protein: 5.8 g/dL — ABNORMAL LOW (ref 6.5–8.1)

## 2015-05-20 LAB — CBC
HCT: 27.3 % — ABNORMAL LOW (ref 39.0–52.0)
Hemoglobin: 9.2 g/dL — ABNORMAL LOW (ref 13.0–17.0)
MCH: 27.7 pg (ref 26.0–34.0)
MCHC: 33.7 g/dL (ref 30.0–36.0)
MCV: 82.2 fL (ref 78.0–100.0)
PLATELETS: 224 10*3/uL (ref 150–400)
RBC: 3.32 MIL/uL — AB (ref 4.22–5.81)
RDW: 18.2 % — AB (ref 11.5–15.5)
WBC: 7.8 10*3/uL (ref 4.0–10.5)

## 2015-05-20 LAB — GLUCOSE, CAPILLARY
GLUCOSE-CAPILLARY: 122 mg/dL — AB (ref 65–99)
Glucose-Capillary: 114 mg/dL — ABNORMAL HIGH (ref 65–99)
Glucose-Capillary: 109 mg/dL — ABNORMAL HIGH (ref 65–99)
Glucose-Capillary: 137 mg/dL — ABNORMAL HIGH (ref 65–99)
Glucose-Capillary: 168 mg/dL — ABNORMAL HIGH (ref 65–99)

## 2015-05-20 LAB — SURGICAL PCR SCREEN
MRSA, PCR: NEGATIVE
Staphylococcus aureus: POSITIVE — AB

## 2015-05-20 SURGERY — LAPAROSCOPIC CHOLECYSTECTOMY
Anesthesia: General

## 2015-05-20 MED ORDER — PROPOFOL 10 MG/ML IV BOLUS
INTRAVENOUS | Status: AC
  Filled 2015-05-20: qty 20

## 2015-05-20 MED ORDER — BUPIVACAINE-EPINEPHRINE 0.25% -1:200000 IJ SOLN
INTRAMUSCULAR | Status: DC | PRN
  Administered 2015-05-20: 20 mL

## 2015-05-20 MED ORDER — SODIUM CHLORIDE 0.9 % IJ SOLN
INTRAMUSCULAR | Status: AC
  Filled 2015-05-20: qty 10

## 2015-05-20 MED ORDER — 0.9 % SODIUM CHLORIDE (POUR BTL) OPTIME
TOPICAL | Status: DC | PRN
  Administered 2015-05-20: 1000 mL

## 2015-05-20 MED ORDER — BUPIVACAINE-EPINEPHRINE (PF) 0.25% -1:200000 IJ SOLN
INTRAMUSCULAR | Status: AC
  Filled 2015-05-20: qty 30

## 2015-05-20 MED ORDER — ROCURONIUM BROMIDE 50 MG/5ML IV SOLN
INTRAVENOUS | Status: AC
Start: 2015-05-20 — End: 2015-05-20
  Filled 2015-05-20: qty 2

## 2015-05-20 MED ORDER — DEXTROSE-NACL 5-0.9 % IV SOLN
INTRAVENOUS | Status: DC
  Administered 2015-05-20: 09:00:00 via INTRAVENOUS

## 2015-05-20 MED ORDER — ROCURONIUM BROMIDE 100 MG/10ML IV SOLN
INTRAVENOUS | Status: DC | PRN
  Administered 2015-05-20: 30 mg via INTRAVENOUS

## 2015-05-20 MED ORDER — BISACODYL 5 MG PO TBEC
10.0000 mg | DELAYED_RELEASE_TABLET | Freq: Once | ORAL | Status: AC
  Administered 2015-05-20: 10 mg via ORAL
  Filled 2015-05-20: qty 2

## 2015-05-20 MED ORDER — ONDANSETRON HCL 4 MG/2ML IJ SOLN: 4.0000 mg | Freq: Once | INTRAMUSCULAR | Status: DC | PRN

## 2015-05-20 MED ORDER — EPHEDRINE SULFATE 50 MG/ML IJ SOLN
INTRAMUSCULAR | Status: DC | PRN
  Administered 2015-05-20: 5 mg via INTRAVENOUS

## 2015-05-20 MED ORDER — LIDOCAINE HCL (CARDIAC) 20 MG/ML IV SOLN
INTRAVENOUS | Status: DC | PRN
  Administered 2015-05-20: 20 mg via INTRAVENOUS

## 2015-05-20 MED ORDER — GLYCOPYRROLATE 0.2 MG/ML IJ SOLN
INTRAMUSCULAR | Status: DC | PRN
  Administered 2015-05-20: .8 mg via INTRAVENOUS

## 2015-05-20 MED ORDER — MORPHINE SULFATE (PF) 2 MG/ML IV SOLN: 1.0000 mg | INTRAVENOUS | Status: DC | PRN

## 2015-05-20 MED ORDER — FENTANYL CITRATE (PF) 100 MCG/2ML IJ SOLN
INTRAMUSCULAR | Status: DC | PRN
  Administered 2015-05-20 (×2): 50 ug via INTRAVENOUS

## 2015-05-20 MED ORDER — FENTANYL CITRATE (PF) 250 MCG/5ML IJ SOLN
INTRAMUSCULAR | Status: AC
  Filled 2015-05-20: qty 5

## 2015-05-20 MED ORDER — LIDOCAINE HCL (CARDIAC) 20 MG/ML IV SOLN
INTRAVENOUS | Status: AC
  Filled 2015-05-20: qty 5

## 2015-05-20 MED ORDER — NEOSTIGMINE METHYLSULFATE 10 MG/10ML IV SOLN
INTRAVENOUS | Status: DC | PRN
  Administered 2015-05-20: 5 mg via INTRAVENOUS

## 2015-05-20 MED ORDER — PROPOFOL 10 MG/ML IV BOLUS
INTRAVENOUS | Status: DC | PRN
  Administered 2015-05-20: 100 mg via INTRAVENOUS

## 2015-05-20 MED ORDER — ONDANSETRON HCL 4 MG/2ML IJ SOLN
INTRAMUSCULAR | Status: DC | PRN
  Administered 2015-05-20: 4 mg via INTRAVENOUS

## 2015-05-20 MED ORDER — SUCCINYLCHOLINE CHLORIDE 20 MG/ML IJ SOLN
INTRAMUSCULAR | Status: AC
  Filled 2015-05-20: qty 2

## 2015-05-20 MED ORDER — HYDROMORPHONE HCL 1 MG/ML IJ SOLN: 0.2500 mg | INTRAMUSCULAR | Status: DC | PRN

## 2015-05-20 SURGICAL SUPPLY — 42 items
ADH SKN CLS APL DERMABOND .7 (GAUZE/BANDAGES/DRESSINGS) ×1
APPLIER CLIP 5 13 M/L LIGAMAX5 (MISCELLANEOUS) ×3
BLADE SURG CLIPPER 3M 9600 (MISCELLANEOUS) IMPLANT
CANISTER SUCTION 2500CC (MISCELLANEOUS) ×3 IMPLANT
CHLORAPREP W/TINT 26ML (MISCELLANEOUS) ×3 IMPLANT
CLIP APPLIE 5 13 M/L LIGAMAX5 (MISCELLANEOUS) ×1 IMPLANT
COVER MAYO STAND STRL (DRAPES) ×3 IMPLANT
COVER SURGICAL LIGHT HANDLE (MISCELLANEOUS) ×3 IMPLANT
DERMABOND ADVANCED (GAUZE/BANDAGES/DRESSINGS) ×2
DERMABOND ADVANCED .7 DNX12 (GAUZE/BANDAGES/DRESSINGS) ×1 IMPLANT
DRAPE C-ARM 42X72 X-RAY (DRAPES) ×3 IMPLANT
ELECT REM PT RETURN 9FT ADLT (ELECTROSURGICAL) ×3
ELECTRODE REM PT RTRN 9FT ADLT (ELECTROSURGICAL) ×1 IMPLANT
GLOVE BIO SURGEON STRL SZ7 (GLOVE) ×3 IMPLANT
GLOVE BIO SURGEON STRL SZ7.5 (GLOVE) ×6 IMPLANT
GLOVE BIOGEL PI IND STRL 7.0 (GLOVE) ×2 IMPLANT
GLOVE BIOGEL PI IND STRL 7.5 (GLOVE) ×2 IMPLANT
GLOVE BIOGEL PI INDICATOR 7.0 (GLOVE) ×4
GLOVE BIOGEL PI INDICATOR 7.5 (GLOVE) ×4
GLOVE SURG SIGNA 7.5 PF LTX (GLOVE) ×3 IMPLANT
GOWN STRL REUS W/ TWL LRG LVL3 (GOWN DISPOSABLE) ×2 IMPLANT
GOWN STRL REUS W/ TWL XL LVL3 (GOWN DISPOSABLE) ×1 IMPLANT
GOWN STRL REUS W/TWL LRG LVL3 (GOWN DISPOSABLE) ×4
GOWN STRL REUS W/TWL XL LVL3 (GOWN DISPOSABLE) ×2
KIT BASIN OR (CUSTOM PROCEDURE TRAY) ×3 IMPLANT
KIT ROOM TURNOVER OR (KITS) ×3 IMPLANT
LIQUID BAND (GAUZE/BANDAGES/DRESSINGS) ×12 IMPLANT
NS IRRIG 1000ML POUR BTL (IV SOLUTION) ×3 IMPLANT
PAD ARMBOARD 7.5X6 YLW CONV (MISCELLANEOUS) ×3 IMPLANT
POUCH SPECIMEN RETRIEVAL 10MM (ENDOMECHANICALS) ×3 IMPLANT
SCISSORS LAP 5X35 DISP (ENDOMECHANICALS) ×3 IMPLANT
SET CHOLANGIOGRAPH 5 50 .035 (SET/KITS/TRAYS/PACK) ×3 IMPLANT
SET IRRIG TUBING LAPAROSCOPIC (IRRIGATION / IRRIGATOR) ×3 IMPLANT
SLEEVE ENDOPATH XCEL 5M (ENDOMECHANICALS) ×6 IMPLANT
SPECIMEN JAR SMALL (MISCELLANEOUS) ×3 IMPLANT
SUT MON AB 4-0 PC3 18 (SUTURE) ×3 IMPLANT
TOWEL OR 17X24 6PK STRL BLUE (TOWEL DISPOSABLE) ×3 IMPLANT
TOWEL OR 17X26 10 PK STRL BLUE (TOWEL DISPOSABLE) ×3 IMPLANT
TRAY LAPAROSCOPIC MC (CUSTOM PROCEDURE TRAY) ×3 IMPLANT
TROCAR XCEL BLUNT TIP 100MML (ENDOMECHANICALS) ×3 IMPLANT
TROCAR XCEL NON-BLD 5MMX100MML (ENDOMECHANICALS) ×3 IMPLANT
TUBING INSUFFLATION (TUBING) ×3 IMPLANT

## 2015-05-20 NOTE — Transfer of Care (Signed)
Immediate Anesthesia Transfer of Care Note  Patient: [name]  Procedure(s) Performed: Procedure(s): LAPAROSCOPIC CHOLECYSTECTOMY (N/A)  Patient Location: PACU  Anesthesia Type:General  Level of Consciousness: awake, alert , oriented and patient cooperative  Airway & Oxygen Therapy: Patient Spontanous Breathing and Patient connected to nasal cannula oxygen  Post-op Assessment: Report given to RN and Post -op Vital signs reviewed and stable  Post vital signs: Reviewed and stable  Last Vitals:  Filed Vitals:   05/20/15 1011 05/20/15 1222  BP: 124/62 126/66  Pulse: 70 74  Temp: 37.2 C 37.2 C  Resp: 18 18    Complications: No apparent anesthesia complications

## 2015-05-20 NOTE — Op Note (Signed)
LAPAROSCOPIC CHOLECYSTECTOMY  Procedure Note  [name] 05/17/2015 - 05/20/2015   Pre-op Diagnosis: SYMPTOMATIC CHOLELITHIASIS     Post-op Diagnosis: same  Procedure(s): LAPAROSCOPIC CHOLECYSTECTOMY  Surgeon(s): Coralie Keens, MD Donnie Mesa, MD  Anesthesia: General  Staff:  Circulator: Glean Hess, RN Radiology Technologist: Christoper Allegra, Rad Tech Scrub Person: Esau Grew, RN; Harrel Lemon, RN; Maynard Circulator Assistant: Candie Mile, RN  Estimated Blood Loss: Minimal               Specimens: sent to path          [name]   Date: 05/20/2015  Time: 5:21 PM

## 2015-05-20 NOTE — Progress Notes (Signed)
[facility]                                                                              Patient Demographics  [name], is a 82 y.o. male, DOB - 1931-08-11, [id number]  Admit date - 05/17/2015   Admitting Physician Vianne Bulls, MD  Outpatient Primary MD for the patient is [name], MD  LOS -   days    Chief Complaint  Patient presents with  . Chest Pain       Brief HPI   Patient is 82 year old male with PUD, lower GI bleed, CAD status post CABG, ischemic cardiomyopathy, insulin-dependent diabetes, chronic kidney disease stage IV presented to ED with pain in the lower chest and epigastric 3 days after being discharged from the hospital.  Patient was admitted from 05/10/2015 - 05/14/2015 with lower GI bleed requiring transfusion. He was discharged home in much improved and stable condition, and initially did quite well. NAD patient was afebrile, vital signs were stable. Initial blood work showed serum creatinine of 3.31 up from 2.55 at the time of discharge. Troponin 0.09, BNP elevated at 509. Hemoglobin is 8.6, down from 9.4. Patient was admitted for further workup.  Interim summary Patient was admitted for lower chest/epigastric pain. Cleared by cardiology, no further cardiac workup. However patient was found to have significant transaminitis. Abdominal ultrasound showed multiple gallstones, no acute cholecystitis, may have had passed a transient CBD stone. Surgery consulted, possible cholecystectomy in a.m.  Assessment & Plan   Chest pain/lower epigastric atypical: Likely was due to passed a CBD stone/cholelithiasis - Currently resolved, Initial trop is 0.09-> 0.08 most likely baseline given his cardiac history and renal failure  - EKG without convincing features for acute ischemia  - All the anticoagulants including aspirin, Plavix, heparin subcutaneous has been held. Per cardiology okay to start aspirin.  - will restart ASA after  laparoscopic cholecystectomy - Cardiology consult was called, recommended continue current therapy for CAD with aspirin, beta blocker and Imdur, no ACE/ARB due to renal insufficiency. No further cardiac workup. - Hold statin secondary to transaminitis  Transaminitis: Likely due to cholelithiasis, may have passed a CBD stone- improving - Hold statins, hepatitis panel negative - Abdominal ultrasound showed multiple small gallstones, no acute cholecystitis. Surgery consulted, lap cholecystectomy planned today   Acute on Chronic Anemia  - Most likely multifactorial to include CKD, slow blood loss from diverticulosis - H&H stable, hemoglobin 9.2  - Continue PPI  - Plavix was discontinued during the previous admission, will restart ASA after lap chole when ok with surgery   DM Type 2 uncontrolled renal complications -  [id number]  7.7%  - Continue Lantus, sliding scale insulin  Chronic systolic and diastolic CHF/ischemic cardiomyopathy  - TTE (11/07/2014) EF 40-45%, mild MR, akinetic inferior aspect  - 3/19 transfused 1 unit PRBC  Acute on chronic renal failure (Cr baseline~ 2.5- 3.3) unclear etiology:  - Creatinine holding up at 3.0, may be the new baseline, up from 2.55 at time of discharge   - Avoiding nephrotoxins, hold ACE/ARB   Pedal edema -Most likely multifactorial to include ischemic cardiomyopathy and acute on  chronic renal failure -TED hose  Code Status: Full code  Family Communication: Discussed in detail with the patient, all imaging results, lab results explained to the patient   Disposition Plan:   Time Spent in minutes  15 minutes  Procedures  Abdominal ultrasound  Consults   Cardiology Gen. surgery  DVT Prophylaxis   SCD's  Medications  Scheduled Meds: . sodium chloride   Intravenous Once  . amLODipine  10 mg Oral Daily  . carvedilol  25 mg Oral BID WC  .  ceFAZolin (ANCEF) IV  2 g Intravenous To SS-Surg  . cloNIDine  0.1 mg Oral QHS  .  insulin aspart  0-15 Units Subcutaneous TID WC  . insulin glargine  8 Units Subcutaneous QHS  . isosorbide mononitrate  60 mg Oral Daily  . latanoprost  1 drop Both Eyes QHS  . pantoprazole  40 mg Oral BID  . pentoxifylline  400 mg Oral BID  . sodium chloride flush  3 mL Intravenous Q12H  . timolol  1 drop Both Eyes q morning - 10a   Continuous Infusions: . dextrose 5 % and 0.9% NaCl 50 mL/hr at 05/20/15 0859   PRN Meds:.sodium chloride, acetaminophen, acetaminophen, HYDROcodone-acetaminophen, nitroGLYCERIN, ondansetron (ZOFRAN) IV, sodium chloride flush, zolpidem   Antibiotics   Anti-infectives    Start     Dose/Rate Route Frequency Ordered Stop   05/20/15 0600  ceFAZolin (ANCEF) IVPB 2 g/50 mL premix    Comments:  Pharmacy may adjust dosing strength, interval, or rate of medication as needed for optimal therapy for the patient  Send with patient on call to the OR.  Anesthesia to complete antibiotic administration <92min prior to incision per [facility].   2 g 100 mL/hr over 30 Minutes Intravenous To ShortStay Surgical 05/19/15 1624 05/21/15 0600        Subjective:   [name] was seen and examined today. No complaints per patient today. No abdominal pain or lower chest pain. LFTs improving, awaiting cholecystectomy today.  Patient denies dizziness, N/V/D/C, new weakness, numbess, tingling. No acute events overnight.    Objective:   Filed Vitals:   05/20/15 0000 05/20/15 0400 05/20/15 0500 05/20/15 1011  BP: 115/58 137/67  124/62  Pulse: 65 72  70  Temp: 99.1 F (37.3 C) 99.3 F (37.4 C)  99 F (37.2 C)  TempSrc: Oral Oral  Oral  Resp: 14 18  18   Height:      Weight:   75.206 kg (165 lb 12.8 oz)   SpO2: 93% 96%  95%    Intake/Output Summary (Last 24 hours) at 05/20/15 1203 Last data filed at 05/20/15 0830  Gross per 24 hour  Intake    480 ml  Output      0 ml  Net    480 ml     Wt Readings from Last 3 Encounters:  05/20/15 75.206 kg (165 lb  12.8 oz)  05/14/15 77.7 kg (171 lb 4.8 oz)  04/22/15 73.936 kg (163 lb)     Exam  General: Alert and oriented x 3, NAD  HEENT:    Neck:   CVS: S1 S2 clear, regular rate and rhythm  Respiratory: CTAB, no wheezing or rhonchi  Abdomen: Soft, nontender, nondistended, + bowel sounds  Ext: no cyanosis clubbing or edema  Neuro: no new deficits  Skin: No rashes  Psych: Normal affect and demeanor, alert and oriented x3    Data Reviewed:  I have personally reviewed following labs and imaging studies  Micro Results Recent Results (from the past 240 hour(s))  MRSA PCR Screening     Status: None   Collection Time: 05/10/15  4:21 PM  Result Value Ref Range Status   MRSA by PCR NEGATIVE NEGATIVE Final    Comment:        The GeneXpert MRSA Assay (FDA approved for NASAL specimens only), is one component of a comprehensive MRSA colonization surveillance program. It is not intended to diagnose MRSA infection nor to guide or monitor treatment for MRSA infections.   Surgical pcr screen     Status: Abnormal   Collection Time: 05/20/15  5:51 AM  Result Value Ref Range Status   MRSA, PCR NEGATIVE NEGATIVE Final   Staphylococcus aureus POSITIVE (A) NEGATIVE Final    Comment:        The Xpert SA Assay (FDA approved for NASAL specimens in patients over 65 years of age), is one component of a comprehensive surveillance program.  Test performance has been validated by [facility] for patients greater than or equal to 93 year old. It is not intended to diagnose infection nor to guide or monitor treatment.     Radiology Reports Dg Chest 2 View  05/17/2015  CLINICAL DATA:  Chest abdominal pain all day. Recent discharge from the hospital for GI bleed. EXAM: CHEST  2 VIEW COMPARISON:  12/29/2014. FINDINGS: Unchanged linear basilar opacities, likely due to scarring. The lungs are otherwise clear. There is no pleural effusion. Hilar and mediastinal contours are unremarkable and  unchanged. Borderline cardiomegaly, unchanged. There is prior sternotomy and CABG. IMPRESSION: Unchanged cardiomegaly. Linear basilar scarring. No acute cardiopulmonary findings. Electronically Signed   By: Andreas Newport M.D.   On: 05/17/2015 02:56   Nm Gi Blood Loss  05/11/2015  CLINICAL DATA:  Patient with hematochezia. EXAM: NUCLEAR MEDICINE GASTROINTESTINAL BLEEDING SCAN TECHNIQUE: Sequential abdominal images were obtained following intravenous administration of Tc-63m labeled red blood cells. RADIOPHARMACEUTICALS:  25.3 mCi Tc-58m in-vitro labeled red cells. COMPARISON:  Renal ultrasound 11/14/2014 FINDINGS: Radiotracer is demonstrated coursing within the left lower quadrant towards the rectum, likely emanating from the sigmoid colon. Radiotracer is demonstrated within the blood pool. IMPRESSION: Findings compatible with acute GI bleed, likely emanating from the sigmoid colon. Critical Value/emergent results were called by telephone at the time of interpretation on 05/11/2015 at 12:45 pm to Dr. Azucena Freed , who verbally acknowledged these results. Electronically Signed   By: Lovey Newcomer M.D.   On: 05/11/2015 12:58   US Abdomen Complete  05/18/2015  CLINICAL DATA:  Elevated liver function tests EXAM: ABDOMEN ULTRASOUND COMPLETE COMPARISON:  None. FINDINGS: Gallbladder: Multiple small mobile stones the largest measuring 9 mm with. No Murphy sign or wall thickening. Common bile duct: Diameter: 2 mm Liver: No focal lesion identified. Within normal limits in parenchymal echogenicity. IVC: No abnormality visualized. Pancreas: Visualized portion unremarkable. Spleen: Size and appearance within normal limits. Right Kidney: Length: 9.6 cm. No significant abnormalities. Several small cysts the largest measuring 17 mm. Left Kidney: Length: 10.2 cm. Echogenicity within normal limits. No mass or hydronephrosis visualized. Abdominal aorta: Distal aorta and bifurcation not visualized. Proximal to mid aorta show no  dilatation. There is atherosclerotic calcification. Other findings: No ascites IMPRESSION: No acute findings.  Cholelithiasis with no Murphy sign. Electronically Signed   By: Skipper Cliche M.D.   On: 05/18/2015 18:34    CBC  Recent Labs Lab 05/14/15 0619 05/17/15 0224  05/17/15 2329 05/18/15 0552 05/18/15 1150 05/19/15 0314 05/20/15 0834  WBC 7.5  8.1  --   --  8.1  --  7.5 7.8  HGB 9.3* 8.6*  < > 9.1* 8.8* 9.0* 8.5* 9.2*  HCT 27.5* 26.5*  < > 28.4* 25.6* 27.1* 25.9* 27.3*  PLT 163 192  --   --  198  --  213 224  MCV 83.1 84.9  --   --  81.5  --  81.4 82.2  MCH 28.1 27.6  --   --  28.0  --  26.7 27.7  MCHC 33.8 32.5  --   --  34.4  --  32.8 33.7  RDW 17.0* 17.6*  --   --  18.7*  --  18.6* 18.2*  LYMPHSABS  --   --   --   --  0.8  --   --   --   MONOABS  --   --   --   --  0.5  --   --   --   EOSABS  --   --   --   --  0.2  --   --   --   BASOSABS  --   --   --   --  0.0  --   --   --   < > = values in this interval not displayed.  Chemistries   Recent Labs Lab 05/14/15 0619 05/17/15 0224 05/18/15 0552 05/19/15 0314 05/20/15 0339  NA 144 141 141 140 138  K 4.4 4.1 3.2* 3.2* 3.8  CL 116* 108 107 107 106  CO2 20* 21* 22 22 21*  GLUCOSE 156* 202* 125* 118* 103*  BUN 23* 34* 37* 40* 42*  CREATININE 2.55* 3.31* 3.13* 3.10* 3.09*  CALCIUM 8.9 9.3 8.9 9.0 9.0  MG  --   --  1.7  --   --   AST 19  --  339* 153* 73*  ALT 13*  --  493* 340* 241*  ALKPHOS 48  --  210* 176* 157*  BILITOT 0.5  --  1.4* 0.7 0.5   ------------------------------------------------------------------------------------------------------------------ estimated creatinine clearance is 19.9 mL/min (by C-G formula based on Cr of 3.09). ------------------------------------------------------------------------------------------------------------------  Recent Labs  05/17/15 2329  HGBA1C 6.9*    ------------------------------------------------------------------------------------------------------------------  Recent Labs  05/17/15 2329  CHOL 81  HDL 22*  LDLCALC 37  TRIG 111  CHOLHDL 3.7   ------------------------------------------------------------------------------------------------------------------ No results for input(s): TSH, T4TOTAL, T3FREE, THYROIDAB in the last 72 hours.  Invalid input(s): FREET3 ------------------------------------------------------------------------------------------------------------------  Recent Labs  05/17/15 2329  VITAMINB12 895  FOLATE 22.2  FERRITIN 27  TIBC 402  IRON 44*  RETICCTPCT 2.1    Coagulation profile  Recent Labs Lab 05/17/15 0224  INR 1.18    No results for input(s): DDIMER in the last 72 hours.  Cardiac Enzymes  Recent Labs Lab 05/17/15 0705 05/17/15 1535 05/17/15 2329  TROPONINI 0.06* 0.08* 0.08*   ------------------------------------------------------------------------------------------------------------------ Invalid input(s): POCBNP   Recent Labs  05/19/15 0725 05/19/15 1143 05/19/15 1638 05/19/15 2334 05/20/15 0731 05/20/15 1127  GLUCAP 119* 206* 122* 135* 114* 122*     Barney Russomanno M.D. [facility] 05/20/2015, 12:03 PM  Pager: [phone] Between 7am to 7pm - call Pager - [phone]  After 7pm go to [email or web address] - password [email or web address]  Call night coverage person covering after 7pm

## 2015-05-20 NOTE — Anesthesia Procedure Notes (Signed)
Procedure Name: Intubation Date/Time: 05/20/2015 4:35 PM Performed by: Trixie Deis A Pre-anesthesia Checklist: Patient identified, Timeout performed, Emergency Drugs available, Suction available and Patient being monitored Patient Re-evaluated:Patient Re-evaluated prior to inductionOxygen Delivery Method: Circle system utilized Preoxygenation: Pre-oxygenation with 100% oxygen Intubation Type: IV induction Ventilation: Mask ventilation without difficulty and Oral airway inserted - appropriate to patient size Laryngoscope Size: Mac and 4 Grade View: Grade I Tube type: Oral Tube size: 7.5 mm Number of attempts: 1 Airway Equipment and Method: Stylet Placement Confirmation: ETT inserted through vocal cords under direct vision,  breath sounds checked- equal and bilateral and positive ETCO2 Secured at: 23 cm Tube secured with: Tape Dental Injury: Teeth and Oropharynx as per pre-operative assessment

## 2015-05-20 NOTE — Progress Notes (Signed)
Patient ID: [name], male   DOB: 1931-07-30, 82 y.o.   MRN: [id number]     [facility]      [address] [place], [place] [address]    Phone: [phone] FAX: [phone]     Subjective: No n/v.  Up in room, hallways yesterday.  sCr stable. BP stable.  Afebrile.   Objective:  Vital signs:  Filed Vitals:   05/19/15 2000 05/20/15 0000 05/20/15 0400 05/20/15 0500  BP: 135/57 115/58 137/67   Pulse: 67 65 72   Temp: 98.8 F (37.1 C) 99.1 F (37.3 C) 99.3 F (37.4 C)   TempSrc: Oral Oral Oral   Resp: 16 14 18    Height:      Weight:    75.206 kg (165 lb 12.8 oz)  SpO2: 100% 93% 96%     Last BM Date: 05/17/15  Intake/Output   Yesterday:  03/21 0701 - 03/22 0700 In: 720 [P.O.:720] Out: -  This shift:    I/O last 3 completed shifts: In: 54 [P.O.:720] Out: 325 [Urine:325]    Physical Exam: General: Pt awake/alert/oriented x4 in no acute distress  Abdomen: Soft.  Nondistended. Non tender.  No evidence of peritonitis.  No incarcerated hernias.    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)   Cardiomyopathy, ischemic   Acute on chronic renal failure (HCC)   Pedal edema   AP (abdominal pain)   Chronic diastolic CHF (congestive heart failure) ([place])    Results:   Labs: Results for orders placed or performed during the hospital encounter of 05/17/15 (from the past 48 hour(s))  Glucose, capillary     Status: Abnormal   Collection Time: 05/18/15 11:35 AM  Result Value Ref Range   Glucose-Capillary 134  (H) 65 - 99 mg/dL  Hemoglobin and hematocrit, blood     Status: Abnormal   Collection Time: 05/18/15 11:50 AM  Result Value Ref Range   Hemoglobin 9.0 (L) 13.0 - 17.0 g/dL   HCT 27.1 (L) 39.0 - 52.0 %  Hepatitis panel, acute     Status: None   Collection Time: 05/18/15 11:50 AM  Result Value Ref Range   Hepatitis B Surface Ag Negative Negative   HCV Ab 0.1 0.0 - 0.9 s/co ratio    Comment: (NOTE)                                  Negative:     < 0.8                             Indeterminate: 0.8 - 0.9                                  Positive:     > 0.9 The CDC recommends that a positive HCV antibody result be followed up with a HCV Nucleic  Acid Amplification test (893810). Performed At: [facility] [place], [place] [id number] Lindon Romp MD ID:[id number]    Hep A IgM Negative Negative   Hep B C IgM Negative Negative  Glucose, capillary     Status: Abnormal   Collection Time: 05/18/15  4:25 PM  Result Value Ref Range   Glucose-Capillary 114 (H) 65 - 99 mg/dL  Glucose, capillary     Status: Abnormal   Collection Time: 05/18/15  9:25 PM  Result Value Ref Range   Glucose-Capillary 179 (H) 65 - 99 mg/dL  Comprehensive metabolic panel     Status: Abnormal   Collection Time: 05/19/15  3:14 AM  Result Value Ref Range   Sodium 140 135 - 145 mmol/L   Potassium 3.2 (L) 3.5 - 5.1 mmol/L   Chloride 107 101 - 111 mmol/L   CO2 22 22 - 32 mmol/L   Glucose, Bld 118 (H) 65 - 99 mg/dL   BUN 40 (H) 6 - 20 mg/dL   Creatinine, Ser 3.10 (H) 0.61 - 1.24 mg/dL   Calcium 9.0 8.9 - 10.3 mg/dL   Total Protein 5.6 (L) 6.5 - 8.1 g/dL   Albumin 3.1 (L) 3.5 - 5.0 g/dL   AST 153 (H) 15 - 41 U/L   ALT 340 (H) 17 - 63 U/L   Alkaline Phosphatase 176 (H) 38 - 126 U/L   Total Bilirubin 0.7 0.3 - 1.2 mg/dL   GFR calc non Af Amer 17 (L) >60 mL/min   GFR calc Af Amer 20 (L) >60 mL/min    Comment: (NOTE) The eGFR has been calculated using the CKD EPI equation. This calculation  has not been validated in all clinical situations. eGFR's persistently <60 mL/min signify possible Chronic Kidney Disease.    Anion gap 11 5 - 15  CBC     Status: Abnormal   Collection Time: 05/19/15  3:14 AM  Result Value Ref Range   WBC 7.5 4.0 - 10.5 K/uL   RBC 3.18 (L) 4.22 - 5.81 MIL/uL   Hemoglobin 8.5 (L) 13.0 - 17.0 g/dL   HCT 25.9 (L) 39.0 - 52.0 %   MCV 81.4 78.0 - 100.0 fL   MCH 26.7 26.0 - 34.0 pg   MCHC 32.8 30.0 - 36.0 g/dL   RDW 18.6 (H) 11.5 - 15.5 %   Platelets 213 150 - 400 K/uL  Glucose, capillary     Status: Abnormal   Collection Time: 05/19/15  7:25 AM  Result Value Ref Range   Glucose-Capillary 119 (H) 65 - 99 mg/dL  Glucose, capillary     Status: Abnormal   Collection Time: 05/19/15 11:43 AM  Result Value Ref Range   Glucose-Capillary 206 (H) 65 - 99 mg/dL  Glucose, capillary     Status: Abnormal   Collection Time: 05/19/15  4:38 PM  Result Value Ref Range   Glucose-Capillary 122 (H) 65 - 99 mg/dL  Glucose, capillary     Status: Abnormal   Collection Time: 05/19/15 11:34 PM  Result Value Ref Range   Glucose-Capillary 135 (H) 65 - 99 mg/dL  Comprehensive metabolic panel     Status: Abnormal   Collection Time: 05/20/15  3:39 AM  Result Value Ref Range   Sodium 138 135 - 145 mmol/L   Potassium 3.8 3.5 - 5.1 mmol/L   Chloride 106 101 - 111 mmol/L   CO2 21 (L) 22 - 32 mmol/L   Glucose, Bld 103 (H) 65 - 99 mg/dL   BUN  42 (H) 6 - 20 mg/dL   Creatinine, Ser 3.09 (H) 0.61 - 1.24 mg/dL   Calcium 9.0 8.9 - 10.3 mg/dL   Total Protein 5.8 (L) 6.5 - 8.1 g/dL   Albumin 3.0 (L) 3.5 - 5.0 g/dL   AST 73 (H) 15 - 41 U/L   ALT 241 (H) 17 - 63 U/L   Alkaline Phosphatase 157 (H) 38 - 126 U/L   Total Bilirubin 0.5 0.3 - 1.2 mg/dL   GFR calc non Af Amer 17 (L) >60 mL/min   GFR calc Af Amer 20 (L) >60 mL/min    Comment: (NOTE) The eGFR has been calculated using the CKD EPI equation. This calculation has not been validated in all clinical situations. eGFR's  persistently <60 mL/min signify possible Chronic Kidney Disease.    Anion gap 11 5 - 15  Glucose, capillary     Status: Abnormal   Collection Time: 05/20/15  7:31 AM  Result Value Ref Range   Glucose-Capillary 114 (H) 65 - 99 mg/dL    Imaging / Studies: US Abdomen Complete  05/18/2015  CLINICAL DATA:  Elevated liver function tests EXAM: ABDOMEN ULTRASOUND COMPLETE COMPARISON:  None. FINDINGS: Gallbladder: Multiple small mobile stones the largest measuring 9 mm with. No Murphy sign or wall thickening. Common bile duct: Diameter: 2 mm Liver: No focal lesion identified. Within normal limits in parenchymal echogenicity. IVC: No abnormality visualized. Pancreas: Visualized portion unremarkable. Spleen: Size and appearance within normal limits. Right Kidney: Length: 9.6 cm. No significant abnormalities. Several small cysts the largest measuring 17 mm. Left Kidney: Length: 10.2 cm. Echogenicity within normal limits. No mass or hydronephrosis visualized. Abdominal aorta: Distal aorta and bifurcation not visualized. Proximal to mid aorta show no dilatation. There is atherosclerotic calcification. Other findings: No ascites IMPRESSION: No acute findings.  Cholelithiasis with no Murphy sign. Electronically Signed   By: Skipper Cliche M.D.   On: 05/18/2015 18:34    Medications / Allergies:  Scheduled Meds: . sodium chloride   Intravenous Once  . amLODipine  10 mg Oral Daily  . carvedilol  25 mg Oral BID WC  .  ceFAZolin (ANCEF) IV  2 g Intravenous To SS-Surg  . cloNIDine  0.1 mg Oral QHS  . insulin aspart  0-15 Units Subcutaneous TID WC  . insulin glargine  8 Units Subcutaneous QHS  . isosorbide mononitrate  60 mg Oral Daily  . latanoprost  1 drop Both Eyes QHS  . pantoprazole  40 mg Oral BID  . pentoxifylline  400 mg Oral BID  . sodium chloride flush  3 mL Intravenous Q12H  . timolol  1 drop Both Eyes q morning - 10a   Continuous Infusions:  PRN Meds:.sodium chloride, acetaminophen,  acetaminophen, HYDROcodone-acetaminophen, nitroGLYCERIN, ondansetron (ZOFRAN) IV, sodium chloride flush, zolpidem  Antibiotics: Anti-infectives    Start     Dose/Rate Route Frequency Ordered Stop   05/20/15 0600  ceFAZolin (ANCEF) IVPB 2 g/50 mL premix    Comments:  Pharmacy may adjust dosing strength, interval, or rate of medication as needed for optimal therapy for the patient  Send with patient on call to the OR.  Anest[facility] to complete antibiotic administration [facility]22mn [facility]   2 g 100 mL/hr over 30 Minutes Intravenous To ShortStay Surgical 05/19/15 1624 05/21/15 0600        Assessment/Plan 82year old male hx of LGIB, anemia, CHF, ICM, CAD, CKD admitted 3//19 with atypical chest pain Cholelithiasis  Abnormal LFTs Likely choledocholithiasis-LFTs improving  indicating a passed CBD stone  To OR today for laparoscopic cholecystectomy(1530 today).  Surgical risks discussed with the patient and his son [name] on 3/21 including infection, bleeding, injury to surrounding structures, heart attack, DVT/PE, respiratory compromise and death.  Verbalize understanding and wish to proceed.  No further questions this AM. ID-ancef on call to OR FEN-NPO, add gentle IVF while NPO  VTE prophylaxis-SCDs,   Erby Pian, ANP-BC [facility] Pager [phone](7A-4:30P) For consults and floor pages call [phone](7A-4:30P)  05/20/2015 8:31 AM

## 2015-05-20 NOTE — Anesthesia Postprocedure Evaluation (Signed)
Anesthesia Post Note  Patient: [name]  Procedure(s) Performed: Procedure(s) (LRB): LAPAROSCOPIC CHOLECYSTECTOMY (N/A)  Patient location during evaluation: PACU Anesthesia Type: General Level of consciousness: awake and alert Pain management: pain level controlled Vital Signs Assessment: post-procedure vital signs reviewed and stable Respiratory status: spontaneous breathing, nonlabored ventilation, respiratory function stable and patient connected to nasal cannula oxygen Cardiovascular status: blood pressure returned to baseline and stable Postop Assessment: no signs of nausea or vomiting Anesthetic complications: no    Last Vitals:  Filed Vitals:   05/20/15 1856 05/20/15 2134  BP: 138/50 150/68  Pulse: 68 80  Temp: 36.7 C 36.5 C  Resp: 18     Last Pain:  Filed Vitals:   05/20/15 2134  PainSc: 0-No pain                 Mizani Dilday A

## 2015-05-20 NOTE — Anesthesia Preprocedure Evaluation (Addendum)
Anesthesia Evaluation  Patient identified by MRN, date of birth, ID band Patient awake    Reviewed: Allergy & Precautions, NPO status , Patient's Chart, lab work & pertinent test results  History of Anesthesia Complications Negative for: history of anesthetic complications  Airway Mallampati: II  TM Distance: >3 FB Neck ROM: Full    Dental no notable dental hx. (+) Dental Advisory Given   Pulmonary shortness of breath, former smoker,    Pulmonary exam normal breath sounds clear to auscultation       Cardiovascular hypertension, Pt. on medications and Pt. on home beta blockers + CAD, + Past MI, + CABG (2003), + Peripheral Vascular Disease and +CHF  Normal cardiovascular exam Rhythm:Regular Rate:Normal  EF is 40-45%  Pt is s/p CABG in 2003, he has significant CAD   Neuro/Psych negative neurological ROS  negative psych ROS   GI/Hepatic Neg liver ROS, PUD,   Endo/Other  diabetes, Insulin Dependent  Renal/GU CRFRenal disease  negative genitourinary   Musculoskeletal negative musculoskeletal ROS (+) Arthritis ,   Abdominal   Peds negative pediatric ROS (+)  Hematology  (+) anemia ,   Anesthesia Other Findings Pt is greater than 11% risk of perioperative MACE per AHA 2014 guidelines  No signs of active cardiac condition like arrhythmia, decompensated CHF or unstable angina  Reproductive/Obstetrics negative OB ROS                           Anesthesia Physical  Anesthesia Plan  ASA: III  Anesthesia Plan: General   Post-op Pain Management:    Induction: Intravenous  Airway Management Planned: Oral ETT  Additional Equipment: Arterial line  Intra-op Plan:   Post-operative Plan: Extubation in OR  Informed Consent: I have reviewed the patients History and Physical, chart, labs and discussed the procedure including the risks, benefits and alternatives for the proposed anesthesia with  the patient or authorized representative who has indicated his/her understanding and acceptance.   Dental advisory given  Plan Discussed with: CRNA and Surgeon  Anesthesia Plan Comments: (To continue BB therapy, is due for carvedilol 25mg  this afternoon, will supplement with IV BB metoprolol if needed for goal HR 60-80 Will place A line to closely monitor HR and BP and respond appropriately if needed during periop period)      Anesthesia Quick Evaluation

## 2015-05-20 NOTE — Progress Notes (Signed)
     Plans for surgery today  WIll continue to follow closely in periop period.  Dorris Carnes

## 2015-05-21 ENCOUNTER — Encounter (HOSPITAL_COMMUNITY): Payer: Self-pay | Admitting: Surgery

## 2015-05-21 ENCOUNTER — Ambulatory Visit: Payer: Commercial Managed Care - HMO | Admitting: Physician Assistant

## 2015-05-21 DIAGNOSIS — K8020 Calculus of gallbladder without cholecystitis without obstruction: Secondary | ICD-10-CM | POA: Diagnosis not present

## 2015-05-21 DIAGNOSIS — R079 Chest pain, unspecified: Secondary | ICD-10-CM | POA: Diagnosis not present

## 2015-05-21 DIAGNOSIS — I2510 Atherosclerotic heart disease of native coronary artery without angina pectoris: Secondary | ICD-10-CM | POA: Diagnosis not present

## 2015-05-21 DIAGNOSIS — I25700 Atherosclerosis of coronary artery bypass graft(s), unspecified, with unstable angina pectoris: Secondary | ICD-10-CM | POA: Diagnosis not present

## 2015-05-21 DIAGNOSIS — N179 Acute kidney failure, unspecified: Secondary | ICD-10-CM | POA: Diagnosis not present

## 2015-05-21 DIAGNOSIS — I25709 Atherosclerosis of coronary artery bypass graft(s), unspecified, with unspecified angina pectoris: Secondary | ICD-10-CM | POA: Diagnosis not present

## 2015-05-21 DIAGNOSIS — R1013 Epigastric pain: Secondary | ICD-10-CM | POA: Diagnosis not present

## 2015-05-21 LAB — TYPE AND SCREEN
ABO/RH(D): O POS
ANTIBODY SCREEN: NEGATIVE
Unit division: 0
Unit division: 0

## 2015-05-21 LAB — CBC
HCT: 28.8 % — ABNORMAL LOW (ref 39.0–52.0)
Hemoglobin: 9.1 g/dL — ABNORMAL LOW (ref 13.0–17.0)
MCH: 26.5 pg (ref 26.0–34.0)
MCHC: 31.6 g/dL (ref 30.0–36.0)
MCV: 84 fL (ref 78.0–100.0)
PLATELETS: 289 10*3/uL (ref 150–400)
RBC: 3.43 MIL/uL — AB (ref 4.22–5.81)
RDW: 18.4 % — ABNORMAL HIGH (ref 11.5–15.5)
WBC: 9.5 10*3/uL (ref 4.0–10.5)

## 2015-05-21 LAB — COMPREHENSIVE METABOLIC PANEL
ALT: 172 U/L — ABNORMAL HIGH (ref 17–63)
ANION GAP: 10 (ref 5–15)
AST: 66 U/L — ABNORMAL HIGH (ref 15–41)
Albumin: 2.9 g/dL — ABNORMAL LOW (ref 3.5–5.0)
Alkaline Phosphatase: 148 U/L — ABNORMAL HIGH (ref 38–126)
BUN: 36 mg/dL — ABNORMAL HIGH (ref 6–20)
CHLORIDE: 108 mmol/L (ref 101–111)
CO2: 22 mmol/L (ref 22–32)
Calcium: 8.9 mg/dL (ref 8.9–10.3)
Creatinine, Ser: 2.97 mg/dL — ABNORMAL HIGH (ref 0.61–1.24)
GFR calc non Af Amer: 18 mL/min — ABNORMAL LOW (ref >60)
GFR, EST AFRICAN AMERICAN: 21 mL/min — AB (ref >60)
Glucose, Bld: 203 mg/dL — ABNORMAL HIGH (ref 65–99)
Potassium: 4.4 mmol/L (ref 3.5–5.1)
SODIUM: 140 mmol/L (ref 135–145)
Total Bilirubin: 0.6 mg/dL (ref 0.3–1.2)
Total Protein: 6.1 g/dL — ABNORMAL LOW (ref 6.5–8.1)

## 2015-05-21 LAB — GLUCOSE, CAPILLARY: GLUCOSE-CAPILLARY: 222 mg/dL — AB (ref 65–99)

## 2015-05-21 MED ORDER — PROMETHAZINE HCL 12.5 MG PO TABS: 12.5000 mg | ORAL_TABLET | Freq: Four times a day (QID) | ORAL | Status: DC | PRN

## 2015-05-21 MED ORDER — TRAMADOL HCL 50 MG PO TABS: 50.0000 mg | ORAL_TABLET | Freq: Three times a day (TID) | ORAL | Status: DC | PRN

## 2015-05-21 MED ORDER — INSULIN ASPART 100 UNIT/ML ~~LOC~~ SOLN
4.0000 [IU] | Freq: Three times a day (TID) | SUBCUTANEOUS | Status: DC
  Administered 2015-05-21: 4 [IU] via SUBCUTANEOUS

## 2015-05-21 MED ORDER — INSULIN GLARGINE 100 UNIT/ML ~~LOC~~ SOLN
14.0000 [IU] | Freq: Every day | SUBCUTANEOUS | Status: DC
  Filled 2015-05-21: qty 0.14

## 2015-05-21 MED ORDER — POLYETHYLENE GLYCOL 3350 17 G PO PACK
17.0000 g | PACK | Freq: Every day | ORAL | Status: DC
  Administered 2015-05-21: 17 g via ORAL
  Filled 2015-05-21: qty 1

## 2015-05-21 MED ORDER — POLYETHYLENE GLYCOL 3350 17 G PO PACK: 17.0000 g | PACK | Freq: Every day | ORAL | Status: DC | PRN

## 2015-05-21 NOTE — Discharge Summary (Signed)
Physician Discharge Summary   Patient ID: [name] MRN: [id number] DOB/AGE: October 30, 1931 82 y.o.  Admit date: 05/17/2015 Discharge date: 05/21/2015  Primary Care Physician:  [name], MD  Discharge Diagnoses:    .Atypical epigastric/ chest pain due to cholelithiasis status post laparoscopic cholecystectomy  . Diabetes mellitus with renal complications ([place]) . Essential hypertension . Coronary atherosclerosis of autologous vein bypass graft . Elevated troponin . CAD (coronary artery disease) of bypass graft . Systolic and diastolic CHF, chronic ([place]) . Uncontrolled type 2 diabetes mellitus with diabetic nephropathy ([place]) . Cardiomyopathy, ischemic . Acute on chronic renal failure ([place]) . Pedal edema  Consults: Cardiology Gen. surgery  Recommendations for Outpatient Follow-up:  1. Hold Tylenol and statins 2. Please repeat CBC/CMET at next visit 3. Patient has follow-up appointment with general surgery.   DIET: Heart healthy diet/carb modified    Allergies:  No Known Allergies   DISCHARGE MEDICATIONS: Current Discharge Medication List    START taking these medications   Details  polyethylene glycol (MIRALAX / GLYCOLAX) packet Take 17 g by mouth daily as needed for mild constipation or moderate constipation. Qty: 30 each, Refills: 1    promethazine (PHENERGAN) 12.5 MG tablet Take 1 tablet (12.5 mg total) by mouth every 6 (six) hours as needed for nausea or vomiting. Qty: 30 tablet, Refills: 0    traMADol (ULTRAM) 50 MG tablet Take 1 tablet (50 mg total) by mouth every 8 (eight) hours as needed for moderate pain or severe pain. Qty: 30 tablet, Refills: 0      CONTINUE these medications which have NOT CHANGED   Details  amLODipine (NORVASC) 10 MG tablet TAKE 1 TABLET DAILY Qty: 90 tablet, Refills: 3    aspirin EC 81 MG tablet Take 81 mg by mouth at bedtime.    carvedilol (COREG) 25 MG tablet Take 1 tablet (25 mg total) by mouth 2 (two)  times daily with a meal. Qty: 60 tablet, Refills: 0    cloNIDine (CATAPRES) 0.1 MG tablet TAKE 1 TABLET AT BEDTIME Qty: 90 tablet, Refills: 3    furosemide (LASIX) 40 MG tablet Take one-half tablet by mouth in the AM and one tablet by mouth in the PM Qty: 135 tablet, Refills: 3    isosorbide mononitrate (IMDUR) 60 MG 24 hr tablet Take 1 tablet (60 mg total) by mouth daily. Qty: 30 tablet, Refills: 0    LANTUS SOLOSTAR 100 UNIT/ML Solostar Pen INJECT 14 UNITS INTO THE SKIN DAILY AT 10 PM. Qty: 15 mL, Refills: 3    nitroGLYCERIN (NITROSTAT) 0.4 MG SL tablet Place 0.4 mg under the tongue every 5 (five) minutes as needed for chest pain (3 doses MAX).    NOVOLOG FLEXPEN 100 UNIT/ML FlexPen Inject 14 Units into the skin 3 (three) times daily.    pantoprazole (PROTONIX) 40 MG tablet Take 1 tablet (40 mg total) by mouth daily. Qty: 30 tablet, Refills: 1    pentoxifylline (TRENTAL) 400 MG CR tablet TAKE 1 TABLET TWICE DAILY  AT  10AM  AND  5PM Qty: 180 tablet, Refills: 3    Potassium Chloride ER 20 MEQ TBCR Take 20 mEq by mouth daily. Qty: 90 tablet, Refills: 3    timolol (TIMOPTIC) 0.5 % ophthalmic solution Place 1 drop into both eyes every morning.    TRAVATAN Z 0.004 % SOLN ophthalmic solution Place 1 drop into both eyes every evening.      STOP taking these medications     acetaminophen (TYLENOL) 500 MG tablet  atorvastatin (LIPITOR) 80 MG tablet          Brief H and P: For complete details please refer to admission H and P, but in brief Patient is 82 year old male with PUD, lower GI bleed, CAD status post CABG, ischemic cardiomyopathy, insulin-dependent diabetes, chronic kidney disease stage IV presented to ED with pain in the lower chest and epigastric 3 days after being discharged from the hospital. Patient was admitted from 05/10/2015 - 05/14/2015 with lower GI bleed requiring transfusion. He was discharged home in much improved and stable condition, and initially did  quite well. NAD patient was afebrile, vital signs were stable. Initial blood work showed serum creatinine of 3.31 up from 2.55 at the time of discharge. Troponin 0.09, BNP elevated at 509. Hemoglobin is 8.6, down from 9.4. Patient was admitted for further workup.  Hospital Course:   Chest pain/lower epigastric atypical: Likely was due to passed a CBD stone/cholelithiasis - Currently resolved, Initial trop is 0.09-> 0.08 most likely baseline given his cardiac history and renal failure  - EKG without convincing features for acute ischemia  - All the anticoagulants including aspirin, Plavix, heparin subcutaneous were held. Per cardiology okay to start aspirin. Patient is also okay to start aspirin, cleared by general surgery. - Cardiology consult was called, recommended continue current therapy for CAD with aspirin, beta blocker and Imdur, no ACE/ARB due to renal insufficiency. No further cardiac workup. - Hold statin secondary to transaminitis  Transaminitis: Likely due to cholelithiasis, may have passed a CBD stone- improving - Hold statins, hepatitis panel negative - Abdominal ultrasound showed multiple small gallstones, no acute cholecystitis. Surgery was consulted - Patient underwent laparoscopic cholecystectomy on 3/22, postoperatively tolerating diet, cleared by general surgery for discharge. He has follow-up appointment.   Acute on Chronic Anemia  - Most likely multifactorial to include CKD, slow blood loss from diverticulosis - H&H stable, hemoglobin 9.2  - Continue PPI  - Plavix was discontinued during the previous admission, patient can resume aspirin, cleared by surgery and cardiology.   DM Type 2 uncontrolled renal complications - [id number] 7.7%  - Continue Lantus, sliding scale insulin  Chronic systolic and diastolic CHF/ischemic cardiomyopathy  - TTE (11/07/2014) EF 40-45%, mild MR, akinetic inferior aspect  - 3/19 transfused 1 unit PRBC  Acute on chronic  renal failure (Cr baseline~ 2.5- 3.3) unclear etiology:  - Creatinine holding up at 2.9, may be the new baseline, up from 2.55 at time of discharge  - Avoiding nephrotoxins, hold ACE/ARB   Pedal edema -Most likely multifactorial to include ischemic cardiomyopathy and acute on chronic renal failure -TED hose   Day of Discharge BP 151/72 mmHg  Pulse 76  Temp(Src) 99.1 F (37.3 C) (Oral)  Resp 18  Ht 5\' 11"  (1.803 m)  Wt 76.522 kg (168 lb 11.2 oz)  BMI 23.54 kg/m2  SpO2 95%  Physical Exam: General: Alert and awake oriented x3 not in any acute distress. HEENT: anicteric sclera, pupils reactive to light and accommodation CVS: S1-S2 clear no murmur rubs or gallops Chest: clear to auscultation bilaterally, no wheezing rales or rhonchi Abdomen: soft minimally tender, normal bowel sounds Extremities: no cyanosis, clubbing or edema noted bilaterally Neuro: Cranial nerves II-XII intact, no focal neurological deficits   The results of significant diagnostics from this hospitalization (including imaging, microbiology, ancillary and laboratory) are listed below for reference.    LAB RESULTS: Basic Metabolic Panel:  Recent Labs Lab 05/18/15 0552  05/20/15 0339 05/21/15 0420  NA 141  < >  138 140  K 3.2*  < > 3.8 4.4  CL 107  < > 106 108  CO2 22  < > 21* 22  GLUCOSE 125*  < > 103* 203*  BUN 37*  < > 42* 36*  CREATININE 3.13*  < > 3.09* 2.97*  CALCIUM 8.9  < > 9.0 8.9  MG 1.7  --   --   --   < > = values in this interval not displayed. Liver Function Tests:  Recent Labs Lab 05/20/15 0339 05/21/15 0420  AST 73* 66*  ALT 241* 172*  ALKPHOS 157* 148*  BILITOT 0.5 0.6  PROT 5.8* 6.1*  ALBUMIN 3.0* 2.9*   No results for input(s): LIPASE, AMYLASE in the last 168 hours. No results for input(s): AMMONIA in the last 168 hours. CBC:  Recent Labs Lab 05/18/15 0552  05/20/15 0834 05/21/15 0420  WBC 8.1  < > 7.8 9.5  NEUTROABS 6.5  --   --   --   HGB 8.8*  < > 9.2* 9.1*   HCT 25.6*  < > 27.3* 28.8*  MCV 81.5  < > 82.2 84.0  PLT 198  < > 224 289  < > = values in this interval not displayed. Cardiac Enzymes:  Recent Labs Lab 05/17/15 1535 05/17/15 2329  TROPONINI 0.08* 0.08*   BNP: Invalid input(s): POCBNP CBG:  Recent Labs Lab 05/20/15 2133 05/21/15 0722  GLUCAP 168* 222*    Significant Diagnostic Studies:  Dg Chest 2 View  05/17/2015  CLINICAL DATA:  Chest abdominal pain all day. Recent discharge from the hospital for GI bleed. EXAM: CHEST  2 VIEW COMPARISON:  12/29/2014. FINDINGS: Unchanged linear basilar opacities, likely due to scarring. The lungs are otherwise clear. There is no pleural effusion. Hilar and mediastinal contours are unremarkable and unchanged. Borderline cardiomegaly, unchanged. There is prior sternotomy and CABG. IMPRESSION: Unchanged cardiomegaly. Linear basilar scarring. No acute cardiopulmonary findings. Electronically Signed   By: Andreas Newport M.D.   On: 05/17/2015 02:56   US Abdomen Complete  05/18/2015  CLINICAL DATA:  Elevated liver function tests EXAM: ABDOMEN ULTRASOUND COMPLETE COMPARISON:  None. FINDINGS: Gallbladder: Multiple small mobile stones the largest measuring 9 mm with. No Murphy sign or wall thickening. Common bile duct: Diameter: 2 mm Liver: No focal lesion identified. Within normal limits in parenchymal echogenicity. IVC: No abnormality visualized. Pancreas: Visualized portion unremarkable. Spleen: Size and appearance within normal limits. Right Kidney: Length: 9.6 cm. No significant abnormalities. Several small cysts the largest measuring 17 mm. Left Kidney: Length: 10.2 cm. Echogenicity within normal limits. No mass or hydronephrosis visualized. Abdominal aorta: Distal aorta and bifurcation not visualized. Proximal to mid aorta show no dilatation. There is atherosclerotic calcification. Other findings: No ascites IMPRESSION: No acute findings.  Cholelithiasis with no Murphy sign. Electronically Signed    By: Skipper Cliche M.D.   On: 05/18/2015 18:34    2D ECHO:   Disposition and Follow-up:    DISPOSITION: home    DISCHARGE FOLLOW-UP Follow-up Information    Follow up with [name], MD. Go on 05/29/2015.   Specialty:  Internal Medicine   Why:  for hospital follow-up, obtain labs LFT's  @345pm    Contact information:   [address] [place] [address] [phone]       Follow up with Harl Bowie, MD. Go on 06/05/2015.   Specialty:  General Surgery   Why:  post op check @1120am    Contact information:   [address] [place] [address] [phone]        Time spent on Discharge: 48mins   Signed:   RAI,RIPUDEEP M.D. [facility] 05/21/2015, 11:45 AM Pager: [phone]

## 2015-05-21 NOTE — Discharge Instructions (Signed)
[facility] ______[facility] LAPAROSCOPIC SURGERY: POST OP INSTRUCTIONS Always review your discharge instruction sheet given to you by the facility where your surgery was performed. IF YOU HAVE DISABILITY OR FAMILY LEAVE FORMS, YOU MUST BRING THEM TO THE OFFICE FOR PROCESSING.   DO NOT GIVE THEM TO YOUR DOCTOR.  1. A prescription for pain medication may be given to you upon discharge.  Take your pain medication as prescribed, if needed.  If narcotic pain medicine is not needed, then you may take acetaminophen (Tylenol) or ibuprofen (Advil) as needed. 2. Take your usually prescribed medications unless otherwise directed. 3. If you need a refill on your pain medication, please contact your pharmacy.  They will contact our office to request authorization. Prescriptions will not be filled after 5pm or on week-ends. 4. You should follow a light diet the first few days after arrival home, such as soup and crackers, etc.  Be sure to include lots of fluids daily. 5. Most patients will experience some swelling and bruising in the area of the incisions.  Ice packs will help.  Swelling and bruising can take several days to resolve.  6. It is common to experience some constipation if taking pain medication after surgery.  Increasing fluid intake and taking a stool softener (such as Colace) will usually help or prevent this problem from occurring.  A mild laxative (Milk of Magnesia or Miralax) should be taken according to package instructions if there are no bowel movements after 48 hours. 7. Unless discharge instructions indicate otherwise, you may remove your bandages 24-48 hours after surgery, and you may shower at that time.  You may have steri-strips (small skin tapes) in place directly over the incision.  These strips should be left on the skin for 7-10 days.  If your surgeon used skin glue on the incision, you may shower in 24 hours.  The glue will flake off over the next 2-3 weeks.  Any sutures or  staples will be removed at the office during your follow-up visit. 8. ACTIVITIES:  You may resume regular (light) daily activities beginning the next day--such as daily self-care, walking, climbing stairs--gradually increasing activities as tolerated.  You may have sexual intercourse when it is comfortable.  Refrain from any heavy lifting or straining until approved by your doctor. a. You may drive when you are no longer taking prescription pain medication, you can comfortably wear a seatbelt, and you can safely maneuver your car and apply brakes. b. RETURN TO WORK:  __________________________________________________________ 9. You should see your doctor in the office for a follow-up appointment approximately 2-3 weeks after your surgery.  Make sure that you call for this appointment within a day or two after you arrive home to insure a convenient appointment time. 10. OTHER INSTRUCTIONS:NO LIFTING MORE THAN 15 TO 20 POUNDS FOR 2 WEEKS 11. OK TO SHOWER TODAY __________________________________________________________________________________________________________________________ __________________________________________________________________________________________________________________________ WHEN TO CALL YOUR DOCTOR: 1. Fever over 101.0 2. Inability to urinate 3. Continued bleeding from incision. 4. Increased pain, redness, or drainage from the incision. 5. Increasing abdominal pain  The clinic staff is available to answer your questions during regular business hours.  Please dont hesitate to call and ask to speak to one of the nurses for clinical concerns.  If you have a medical emergency, go to the nearest emergency room or call 911.  A surgeon from [facility] is always on call at the hospital. [address], [place], [place], [place]  [address] ? [id number],  [place], [place]   [address] [phone] ? [phone] ? FAX [phone] Web site:  [email or web address]

## 2015-05-21 NOTE — Op Note (Signed)
NAME:  ALBERT, TAINTER NO.:  [id number]  MEDICAL RECORD NO.:  [id number]  LOCATION:  [id number]                        FACILITY:  [facility]  PHYSICIAN:  Coralie Keens, M.D. DATE OF BIRTH:  1932/02/01  DATE OF PROCEDURE:  05/18/2015 DATE OF DISCHARGE:                              OPERATIVE REPORT   PREOPERATIVE DIAGNOSIS:  Symptomatic cholelithiasis.  POSTOPERATIVE DIAGNOSIS:  Symptomatic cholelithiasis.  PROCEDURE:  Laparoscopic cholecystectomy.  SURGEON:  Coralie Keens, M.D.  ASSISTANT:  Imogene Burn. Tsuei, M.D.  ANESTHESIA:  General endotracheal anesthesia.  ESTIMATED BLOOD LOSS:  Minimal.  FINDINGS:  The patient was found to have a mildly thickened gallbladder. I could not perform a cholangiogram as I could not get the catheter to go down the cystic duct despite multiple attempts.  PROCEDURE IN DETAIL:  The patient was brought to the operating room, identified as [name].  He was placed supine on the operating room table.  General anesthesia was induced.  His abdomen was then prepped and draped in the usual sterile fashion.  I made a small vertical incision just below the umbilicus with a scalpel.  I took this down to the fascia which was then opened with scalpel as well.  Hemostat was used to pass into the peritoneal cavity under direct vision.  A 0 Vicryl pursestring suture was then placed around the fascial opening. The Hasson port was placed through the opening and insufflation of the abdomen was begun.  A 5-mm port was then placed in the patient's epigastrium, 2 in the right upper quadrant, all under direct vision. The gallbladder was then grasped and retracted above the liver bed. Several adhesions to the gallbladder were then taken down bluntly.  The cystic duct and cystic artery were then dissected out and a critical window was achieved around both.  I clipped the cystic duct distally.  I then clipped the cystic artery proximally and  distally as well.  I then made a small opening in the cystic duct.  I made an incision in the patient's right upper quadrant and placed a cholangiocatheter through this under direct vision.  I then made multiple attempts to get the cholangiocatheter to go into the cystic duct.  It only went for about 1 cm, I could not be clipped off.  I could not see down any further, therefore decision made to forego cholangiogram.  I then clipped the cystic duct 3 times proximally and completely transected.  The gallbladder was then slowly dissected free from liver bed with electrocautery.  Posterior branch of the cystic artery had to be clamped and the liver bed as well to achieve hemostasis.  Once the gallbladder free from the liver bed, hemostasis was achieved with cautery.  It was then placed in an Endosac and removed through the incision at the umbilicus.  The abdomen was then copiously irrigated with normal saline. Again, hemostasis appeared to be achieved.  All ports were removed under direct vision.  The abdomen was deflated.  The 0 Vicryl loop at the umbilicus was tied and placed closing the fascial defect.  All incisions were then anesthetized with Marcaine and closed with 4-0 Monocryl subcuticular sutures.  Skin glue was then applied.  The patient  tolerated the procedure well.  All the counts were correct at the end of procedure.  The patient was then extubated in operating room and taken in stable condition to recovery room.     Coralie Keens, M.D.     DB/MEDQ  D:  05/20/2015  T:  05/20/2015  Job:  [id number]

## 2015-05-21 NOTE — Progress Notes (Signed)
1 Day Post-Op  Subjective: Comfortable No complaints  Objective: Vital signs in last 24 hours: Temp:  [97.5 F (36.4 C)-99.1 F (37.3 C)] 99.1 F (37.3 C) (03/23 0725) Pulse Rate:  [59-80] 76 (03/23 0725) Resp:  [16-18] 18 (03/23 0725) BP: (121-160)/(50-72) 151/72 mmHg (03/23 0725) SpO2:  [86 %-96 %] 95 % (03/23 0725) Arterial Line BP: (147)/(57) 147/57 mmHg (03/22 1800) Weight:  [76.522 kg (168 lb 11.2 oz)] 76.522 kg (168 lb 11.2 oz) (03/23 0445) Last BM Date: 05/17/15  Intake/Output from previous day: 03/22 0701 - 03/23 0700 In: 450 [I.V.:450] Out: 825 [Urine:800; Blood:25] Intake/Output this shift:    Awake and alert Abdomen soft, minimally tender  Lab Results:   Recent Labs  05/20/15 0834 05/21/15 0420  WBC 7.8 9.5  HGB 9.2* 9.1*  HCT 27.3* 28.8*  PLT 224 289   BMET  Recent Labs  05/20/15 0339 05/21/15 0420  NA 138 140  K 3.8 4.4  CL 106 108  CO2 21* 22  GLUCOSE 103* 203*  BUN 42* 36*  CREATININE 3.09* 2.97*  CALCIUM 9.0 8.9   PT/INR No results for input(s): LABPROT, INR in the last 72 hours. ABG No results for input(s): PHART, HCO3 in the last 72 hours.  Invalid input(s): PCO2, PO2  Studies/Results: No results found.  Anti-infectives: Anti-infectives    Start     Dose/Rate Route Frequency Ordered Stop   05/20/15 0600  ceFAZolin (ANCEF) IVPB 2 g/50 mL premix    Comments:  Pharmacy may adjust dosing strength, interval, or rate of medication as needed for optimal therapy for the patient  Send with patient on call to the OR.  Anesthesia to complete antibiotic administration <72min prior to incision per [facility].   2 g 100 mL/hr over 30 Minutes Intravenous To ShortStay Surgical 05/19/15 1624 05/21/15 0600      Assessment/Plan: s/p Procedure(s): LAPAROSCOPIC CHOLECYSTECTOMY (N/A)  LFT's continuing to improve Ok for discharge from a surgery standpoint Ok to resume any anticoag meds     Virgilio Broadhead A 05/21/2015

## 2015-05-21 NOTE — Progress Notes (Signed)
Pt discharged home. Discharge instructions have been gone over with the patient. IV's removed. Pt given unit number and told to call if they have any concerns regarding their discharge instructions. Pt verbalizes understanding of all discharge instructions

## 2015-05-21 NOTE — Progress Notes (Signed)
Pt states he does not feel constipated, but has not had a BM since 3/19. Md notified

## 2015-05-25 ENCOUNTER — Encounter (HOSPITAL_COMMUNITY): Payer: Self-pay | Admitting: Emergency Medicine

## 2015-05-25 ENCOUNTER — Emergency Department (HOSPITAL_COMMUNITY): Payer: Commercial Managed Care - HMO

## 2015-05-25 ENCOUNTER — Emergency Department (HOSPITAL_COMMUNITY)
Admission: EM | Admit: 2015-05-25 | Discharge: 2015-05-25 | Disposition: A | Discharge status: Home / Self Care | Payer: Commercial Managed Care - HMO | Attending: Emergency Medicine | Admitting: Emergency Medicine

## 2015-05-25 ENCOUNTER — Other Ambulatory Visit: Payer: Self-pay

## 2015-05-25 DIAGNOSIS — I5043 Acute on chronic combined systolic (congestive) and diastolic (congestive) heart failure: Secondary | ICD-10-CM | POA: Diagnosis not present

## 2015-05-25 DIAGNOSIS — E119 Type 2 diabetes mellitus without complications: Secondary | ICD-10-CM | POA: Insufficient documentation

## 2015-05-25 DIAGNOSIS — Z79899 Other long term (current) drug therapy: Secondary | ICD-10-CM | POA: Insufficient documentation

## 2015-05-25 DIAGNOSIS — R2240 Localized swelling, mass and lump, unspecified lower limb: Secondary | ICD-10-CM | POA: Insufficient documentation

## 2015-05-25 DIAGNOSIS — Z8719 Personal history of other diseases of the digestive system: Secondary | ICD-10-CM | POA: Insufficient documentation

## 2015-05-25 DIAGNOSIS — I130 Hypertensive heart and chronic kidney disease with heart failure and stage 1 through stage 4 chronic kidney disease, or unspecified chronic kidney disease: Secondary | ICD-10-CM | POA: Insufficient documentation

## 2015-05-25 DIAGNOSIS — R05 Cough: Secondary | ICD-10-CM | POA: Diagnosis not present

## 2015-05-25 DIAGNOSIS — M1990 Unspecified osteoarthritis, unspecified site: Secondary | ICD-10-CM | POA: Insufficient documentation

## 2015-05-25 DIAGNOSIS — I2510 Atherosclerotic heart disease of native coronary artery without angina pectoris: Secondary | ICD-10-CM | POA: Diagnosis not present

## 2015-05-25 DIAGNOSIS — Z951 Presence of aortocoronary bypass graft: Secondary | ICD-10-CM | POA: Insufficient documentation

## 2015-05-25 DIAGNOSIS — N184 Chronic kidney disease, stage 4 (severe): Secondary | ICD-10-CM | POA: Diagnosis not present

## 2015-05-25 DIAGNOSIS — R0602 Shortness of breath: Secondary | ICD-10-CM | POA: Diagnosis not present

## 2015-05-25 DIAGNOSIS — R635 Abnormal weight gain: Secondary | ICD-10-CM | POA: Insufficient documentation

## 2015-05-25 DIAGNOSIS — Z7982 Long term (current) use of aspirin: Secondary | ICD-10-CM | POA: Insufficient documentation

## 2015-05-25 DIAGNOSIS — Z9889 Other specified postprocedural states: Secondary | ICD-10-CM | POA: Diagnosis not present

## 2015-05-25 DIAGNOSIS — Z9861 Coronary angioplasty status: Secondary | ICD-10-CM | POA: Diagnosis not present

## 2015-05-25 DIAGNOSIS — Z9981 Dependence on supplemental oxygen: Secondary | ICD-10-CM | POA: Insufficient documentation

## 2015-05-25 DIAGNOSIS — Z87891 Personal history of nicotine dependence: Secondary | ICD-10-CM | POA: Diagnosis not present

## 2015-05-25 DIAGNOSIS — Z862 Personal history of diseases of the blood and blood-forming organs and certain disorders involving the immune mechanism: Secondary | ICD-10-CM | POA: Insufficient documentation

## 2015-05-25 DIAGNOSIS — I5041 Acute combined systolic (congestive) and diastolic (congestive) heart failure: Secondary | ICD-10-CM | POA: Diagnosis not present

## 2015-05-25 DIAGNOSIS — I110 Hypertensive heart disease with heart failure: Secondary | ICD-10-CM | POA: Diagnosis not present

## 2015-05-25 LAB — BRAIN NATRIURETIC PEPTIDE: B Natriuretic Peptide: 1089.1 pg/mL — ABNORMAL HIGH (ref 0.0–100.0)

## 2015-05-25 LAB — BASIC METABOLIC PANEL
Anion gap: 9 (ref 5–15)
BUN: 39 mg/dL — AB (ref 6–20)
CALCIUM: 9.1 mg/dL (ref 8.9–10.3)
CO2: 23 mmol/L (ref 22–32)
CREATININE: 3.06 mg/dL — AB (ref 0.61–1.24)
Chloride: 108 mmol/L (ref 101–111)
GFR, EST AFRICAN AMERICAN: 21 mL/min — AB (ref >60)
GFR, EST NON AFRICAN AMERICAN: 18 mL/min — AB (ref >60)
GLUCOSE: 71 mg/dL (ref 65–99)
Potassium: 4.6 mmol/L (ref 3.5–5.1)
SODIUM: 140 mmol/L (ref 135–145)

## 2015-05-25 LAB — CBC WITH DIFFERENTIAL/PLATELET
BASOS ABS: 0 10*3/uL (ref 0.0–0.1)
BASOS PCT: 0 %
EOS ABS: 0.3 10*3/uL (ref 0.0–0.7)
Eosinophils Relative: 5 %
HCT: 27.7 % — ABNORMAL LOW (ref 39.0–52.0)
HEMOGLOBIN: 9.1 g/dL — AB (ref 13.0–17.0)
LYMPHS ABS: 1 10*3/uL (ref 0.7–4.0)
Lymphocytes Relative: 13 %
MCH: 27.6 pg (ref 26.0–34.0)
MCHC: 32.9 g/dL (ref 30.0–36.0)
MCV: 83.9 fL (ref 78.0–100.0)
Monocytes Absolute: 0.4 10*3/uL (ref 0.1–1.0)
Monocytes Relative: 6 %
NEUTROS PCT: 76 %
Neutro Abs: 5.7 10*3/uL (ref 1.7–7.7)
Platelets: 311 10*3/uL (ref 150–400)
RBC: 3.3 MIL/uL — AB (ref 4.22–5.81)
RDW: 17.4 % — ABNORMAL HIGH (ref 11.5–15.5)
WBC: 7.4 10*3/uL (ref 4.0–10.5)

## 2015-05-25 LAB — I-STAT TROPONIN, ED: TROPONIN I, POC: 0.05 ng/mL (ref 0.00–0.08)

## 2015-05-25 MED ORDER — FUROSEMIDE 40 MG PO TABS: 40.0000 mg | ORAL_TABLET | ORAL | Status: DC

## 2015-05-25 MED ORDER — FUROSEMIDE 10 MG/ML IJ SOLN
40.0000 mg | Freq: Once | INTRAMUSCULAR | Status: AC
  Administered 2015-05-25: 40 mg via INTRAVENOUS
  Filled 2015-05-25: qty 4

## 2015-05-25 NOTE — Progress Notes (Signed)
Cardiology Office Note:    Date:  05/26/2015   ID:  [name], DOB [birth year], MRN [id number]  PCP:  [name], MD  Cardiologist:  Dr. Daneen Schick   Electrophysiologist:  N/a Nephrologist: Dr. Joelyn Oms  Chief Complaint  Patient presents with  . Hospitalization Follow-up    History of Present Illness:     [name] is a 82 y.o. male with a hx of CAD status post CABG in 2003, ischemic cardiomyopathy, CKD, diabetes, peptic ulcer disease, prior lower GI bleed. Patient suffered a non-STEMI in 9/16. LIMA-LAD was patent by cardiac catheterization. All vein grafts were occluded. Medical therapy was recommended given CKD. Last seen by Dr. Tamala Julian 1/17.  Admitted 3/12-3/16 with lower GI bleed. He was transfused with 4 units PRBCs. He was seen by cardiology. He was taken off of Plavix and left on ASA.  Readmitted 3/19-3/23 with abdominal discomfort and minimally elevated troponin levels. He was seen by cardiology. Abdominal US demonstrated cholelithiasis and he underwent laparoscopic cholecystectomy.  He was then seen in the emergency room again last night with complaints of dyspnea and weight gain of 10 pounds. He was given 40 mg of IV Lasix. Outpatient Lasix regimen was adjusted.  Returns for follow-up.  He is feeling much better. LE edema is better.  Denies further orthopnea, PND.  No chest pain, syncope.  Denies cough, wheezing.  Denies melena, hematochezia.   Past Medical History  Diagnosis Date  . CAD (coronary artery disease)     a. H/o MI in 1998;  b. s/p CABG 2003. c. 10/2014 NSTEMI/Cath: LM 75, LAD 171m/d, D1 95, D2 50, LCX 100ost/p, OM1 80, OM2 80, RCA 100p/m, RPDA fills via L->L collats, LIMA->LAD ok, VG->dRCA 100, VG->OM1->OM2 99 prox to OM1 insertion->recanalated ->Tx with heparin,  VG->D1 100-->Med Rx.  Marland Kitchen Hyperlipidemia   . Hypertensive heart disease   . Type II diabetes mellitus ([place])   . History of stomach ulcers 2013  . Arthritis     "touch  in my fingers" (09/10/2014)  . B12 deficiency anemia     "stopped taking the shots in ~ 06/2014" (09/10/2014)  . Diabetes 1.5, managed as type 1 ([place])   . CKD (chronic kidney disease), stage IV ([place])   . Chronic systolic CHF (congestive heart failure) (HCC)     a. EF 40-45% in 10/2014 (previously normal in 08/2014).  . Ischemic cardiomyopathy     a. 10/2014 Echo: EF 40-45%.  Marland Kitchen PVD (peripheral vascular disease) [facility])     Past Surgical History  Procedure Laterality Date  . Esophagogastroduodenoscopy  06/10/2011    Procedure: ESOPHAGOGASTRODUODENOSCOPY (EGD);  Surgeon: Ladene Artist, MD,FACG;  Location: [facility] ENDOSCOPY;  Service: Endoscopy;  Laterality: N/A;  . Coronary artery bypass graft  2003    CABG X5  . Cardiac catheterization  2003;   . Coronary angioplasty  1998    [name] 07/13/2010  . Cataract extraction, bilateral Bilateral   . Cardiac catheterization N/A 11/07/2014    Left Heart Cath; Belva Crome, MD;   . Colonoscopy    . Colonoscopy N/A 05/12/2015    Procedure: COLONOSCOPY;  Surgeon: Milus Banister, MD;  Location: [place];  Service: Endoscopy;  Laterality: N/A;  . Cholecystectomy N/A 05/20/2015    Procedure: LAPAROSCOPIC CHOLECYSTECTOMY;  Surgeon: Coralie Keens, MD;  Location: [place];  Service: General;  Laterality: N/A;    Current Medications: Outpatient Prescriptions Prior to Visit  Medication Sig Dispense Refill  . aspirin EC 81 MG tablet Take  81 mg by mouth at bedtime.    . carvedilol (COREG) 25 MG tablet Take 1 tablet (25 mg total) by mouth 2 (two) times daily with a meal. 60 tablet 0  . isosorbide mononitrate (IMDUR) 60 MG 24 hr tablet Take 1 tablet (60 mg total) by mouth daily. 30 tablet 0  . LANTUS SOLOSTAR 100 UNIT/ML Solostar Pen INJECT 14 UNITS INTO THE SKIN DAILY AT 10 PM. 15 mL 3  . nitroGLYCERIN (NITROSTAT) 0.4 MG SL tablet Place 0.4 mg under the tongue every 5 (five) minutes as needed for chest pain (3 doses MAX).    Marland Kitchen NOVOLOG FLEXPEN 100 UNIT/ML FlexPen  Inject 14 Units into the skin 3 (three) times daily.    . pantoprazole (PROTONIX) 40 MG tablet Take 1 tablet (40 mg total) by mouth daily. 30 tablet 1  . pentoxifylline (TRENTAL) 400 MG CR tablet TAKE 1 TABLET TWICE DAILY  AT  10AM  AND  5PM 180 tablet 3  . polyethylene glycol (MIRALAX / GLYCOLAX) packet Take 17 g by mouth daily as needed for mild constipation or moderate constipation. 30 each 1  . Potassium Chloride ER 20 MEQ TBCR Take 20 mEq by mouth daily. 90 tablet 3  . promethazine (PHENERGAN) 12.5 MG tablet Take 1 tablet (12.5 mg total) by mouth every 6 (six) hours as needed for nausea or vomiting. 30 tablet 0  . timolol (TIMOPTIC) 0.5 % ophthalmic solution Place 1 drop into both eyes every morning.    . traMADol (ULTRAM) 50 MG tablet Take 1 tablet (50 mg total) by mouth every 8 (eight) hours as needed for moderate pain or severe pain. 30 tablet 0  . TRAVATAN Z 0.004 % SOLN ophthalmic solution Place 1 drop into both eyes every evening.    Marland Kitchen amLODipine (NORVASC) 10 MG tablet TAKE 1 TABLET DAILY 90 tablet 3  . cloNIDine (CATAPRES) 0.1 MG tablet TAKE 1 TABLET AT BEDTIME 90 tablet 3  . furosemide (LASIX) 40 MG tablet Take one-half tablet by mouth in the AM and one tablet by mouth in the PM (Patient taking differently: Take one-half tablet by mouth in the AM and one tablet by mouth in the EVENING) 135 tablet 3  . furosemide (LASIX) 40 MG tablet Take 1 tablet (40 mg total) by mouth every morning. 5 tablet 0   No facility-administered medications prior to visit.     Allergies:   Review of patient's allergies indicates no known allergies.   Social History   Social History  . Marital Status: Widowed    Spouse Name: N/A  . Number of Children: N/A  . Years of Education: N/A   Occupational History  . Retired    Social History Main Topics  . Smoking status: Former Smoker -- 0.50 packs/day for 20 years    Types: Cigarettes  . Smokeless tobacco: Never Used     Comment: "quit smoking  cigarettes in the 1970's  . Alcohol Use: No  . Drug Use: No  . Sexual Activity: Not Currently   Other Topics Concern  . None   Social History Narrative   Pt lives alone, family nearby.     Family History:  The patient's family history includes Cancer in his mother; Heart attack in his sister; Hypertension in his mother and sister; Stroke in his mother.   ROS:   Please see the history of present illness.    ROS All other systems reviewed and are negative.   Physical Exam:    VS:  BP 122/60 mmHg  Pulse 72  Ht 5\' 11"  (1.803 m)  Wt 167 lb (75.751 kg)  BMI 23.30 kg/m2  SpO2 97%   GEN: Well nourished, well developed, in no acute distress HEENT: normal Neck: no JVD, no masses Cardiac: Normal S1/S2, RRR; no murmurs, rubs, or gallops, 1+ bilateral LE edema;    Respiratory:  clear to auscultation bilaterally; no wheezing, rhonchi or rales GI: soft, nontender, nondistended MS: no deformity or atrophy Skin: warm and dry Neuro: No focal deficits  Psych: Alert and oriented x 3, normal affect  Wt Readings from Last 3 Encounters:  05/26/15 167 lb (75.751 kg)  05/25/15 175 lb (79.379 kg)  05/21/15 168 lb 11.2 oz (76.522 kg)      Studies/Labs Reviewed:     EKG:  EKG is not ordered today.  The ekg ordered today demonstrates n/a ECG last night in ED demonstrated sinus brady, HR 53, PRWP, 1st degree AVB, PR 210 ms, TWI 1, 2, 3, aVL, aVF, V1-6  Recent Labs: 12/29/2014: TSH 2.846 05/18/2015: Magnesium 1.7 05/21/2015: ALT 172* 05/25/2015: B Natriuretic Peptide 1089.1*; BUN 39*; Creatinine, Ser 3.06*; Hemoglobin 9.1*; Platelets 311; Potassium 4.6; Sodium 140   Recent Lipid Panel    Component Value Date/Time   CHOL 81 05/17/2015 2329   TRIG 111 05/17/2015 2329   HDL 22* 05/17/2015 2329   CHOLHDL 3.7 05/17/2015 2329   VLDL 22 05/17/2015 2329   LDLCALC 37 05/17/2015 2329   LDLDIRECT 93.3 01/22/2013 0854    Additional studies/ records that were reviewed today include:   Venous  duplex 05/14/15 No evidence of deep vein or superficial thrombosis involving the left upper extremity and right subclavian vein.  LHC 9/16 LM 75% LAD mid to distal 100%, D1 95%, 50% LCx ostial 100%, OM1 80%, OM to 80% RCA proximal 100%, mid 100%, RPDA filled by distal LAD LIMA-LAD patent SVG-RCA 100% SVG-OM1/OM2 99% proximal with heavy thrombus SVG-D1 100%   Echo 9/16 EF 40-45%, inferior akinesis, mild MR, PASP 46 mmHg  Myoview 7/16  Defect 1: There is a large defect of severe severity.  Findings consistent with ischemia and prior myocardial infarction.  This is a high risk study.  Nuclear stress EF: 46%. Large, severe intensity mostly reversible anterior/anterolateral wall perfusion defect suggestive of ischemia. Moderate sized and intensity fixed inferior defect suggestive of scar. LVEF is 46% with severe inferior hypokinesis to akinesis. This is an abnormal, high risk study.   ASSESSMENT:     1. Chronic systolic heart failure ([place])   2. Coronary artery disease involving autologous vein coronary bypass graft without angina pectoris   3. History of lower GI bleeding   4. Essential hypertension   5. CKD (chronic kidney disease), unspecified stage   6. Elevated LFTs   7. HLD (hyperlipidemia)     PLAN:     In order of problems listed above:  1. Chronic systolic CHF - Ischemic CM.  EF 40-45% by Echo 9/16.  I reviewed his chart. Previously on Lasix 60 in A and 40 in P.  DC summary from his cholecystectomy had him on Lasix 20 in A and 40 in P.  Suspect this led to his volume excess.  Now back on Lasix 60/40.  Would continue this dose.  We discussed the importance of daily weights and when to call.   -  Continue beta-blocker, nitrates.  Could consider adding Hydralazine in lieu of Amlodipine at FU.    -  He is not on ACE inhibitor or  angiotensin receptor blocker given CKD.    -  BMET 1 week  2. CAD - s/p CABG with NSTEMI in 9/16 with all VGs occluded.  L-LAD was patent.  PCI  was deferred given CKD.  Med Rx continued.  He had recent admit with LGI bleed and cholelithiasis leading to cholecystectomy with minimally elevated Troponin levels. Suspect this was demand ischemia in setting of CKD.  Denies angina.  Now off Plavix given recent LGI bleed.  -  Continue ASA, beta-blocker.  -  Statin held 2/2 elevated LFTs in setting of cholelithiasis.    -  Get LFTs in 1 week.  If normal >> resume statin.  3. Hx of LGI bleed - No apparent recurrence. He remains on ASA.    4. HTN - BP controlled.   5. CKD - Stage 4. FU with Nephrology as planned.  Repeat BMET 1 week.   6. Elevated LFTs - In setting of choledocholithiasis.  Repeat LFTs in 1 week.  FU with Gen surgery as planned.   7. HL - If LFTs ok in 1 week, resume high dose statin.    Medication Adjustments/Labs and Tests Ordered: Current medicines are reviewed at length with the patient today.  Concerns regarding medicines are outlined above.  Medication changes, Labs and Tests ordered today are outlined in the Patient Instructions noted below. Patient Instructions  Medication Instructions:  CONTINUE DOSE OF LASIX 60 MG IN THE MORNING AND 40 MG IN THE PM  Labwork: 1 WEEK BMET  Testing/Procedures: NONE  Follow-Up: DR. Tamala Julian IN 4 WEEKS  Any Other Special Instructions Will Be Listed Below (If Applicable). PLEASE CALL THE OFFICE TO LET us KNOW WHAT IS THE DOSE OF IMDUR YOU ARE TAKING ; [phone]  If you need a refill on your cardiac medications before your next appointment, please call your pharmacy.    Signed, Richardson Dopp, PA-C  05/26/2015 4:23 PM    [facility] [place], [place], [place]  [address] Phone: [phone]; Fax: [phone]

## 2015-05-25 NOTE — ED Provider Notes (Signed)
CSN: [id number]     Arrival date & time 05/25/15  1143 History   First MD Initiated Contact with Patient 05/25/15 1240     Chief Complaint  Patient presents with  . Shortness of Breath     (Consider location/radiation/quality/duration/timing/severity/associated sxs/prior Treatment) Patient is a 82 y.o. male presenting with shortness of breath. The history is provided by the patient.  Shortness of Breath Severity:  Moderate Onset quality:  Gradual Duration:  1 day Timing:  Intermittent Progression:  Resolved Chronicity:  Recurrent Context comment:  Lying flat last night at bedtime Relieved by:  Nothing Worsened by:  Nothing tried Ineffective treatments:  None tried Associated symptoms: no chest pain   Associated symptoms comment:  Increased leg swelling and 10 pound weight gain Risk factors comment:  History of congestive heart failure, chronic kidney disease, and remote CABG   Past Medical History  Diagnosis Date  . CAD (coronary artery disease)     a. H/o MI in 1998;  b. s/p CABG 2003. c. 10/2014 NSTEMI/Cath: LM 75, LAD 144m/d, D1 95, D2 50, LCX 100ost/p, OM1 80, OM2 80, RCA 100p/m, RPDA fills via L->L collats, LIMA->LAD ok, VG->dRCA 100, VG->OM1->OM2 99 prox to OM1 insertion->recanalated ->Tx with heparin,  VG->D1 100-->Med Rx.  Marland Kitchen Hyperlipidemia   . Hypertensive heart disease   . Type II diabetes mellitus ([place])   . History of stomach ulcers 2013  . Arthritis     "touch in my fingers" (09/10/2014)  . B12 deficiency anemia     "stopped taking the shots in ~ 06/2014" (09/10/2014)  . Diabetes 1.5, managed as type 1 ([place])   . CKD (chronic kidney disease), stage IV ([place])   . Chronic systolic CHF (congestive heart failure) (HCC)     a. EF 40-45% in 10/2014 (previously normal in 08/2014).  . Ischemic cardiomyopathy     a. 10/2014 Echo: EF 40-45%.  Marland Kitchen PVD (peripheral vascular disease) [facility])    Past Surgical History  Procedure Laterality Date  . Esophagogastroduodenoscopy  06/10/2011     Procedure: ESOPHAGOGASTRODUODENOSCOPY (EGD);  Surgeon: Ladene Artist, MD,FACG;  Location[facility] ENDOSCOPY;  Service: Endoscopy;  Laterality: N/A;  . Coronary artery bypass graft  2003    CABG X5  . Cardiac catheterization  2003;   . Coronary angioplasty  1998    [name] 07/13/2010  . Cataract extraction, bilateral Bilateral   . Cardiac catheterization N/A 11/07/2014    Left Heart Cath; Belva Crome, MD;   . Colonoscopy    . Colonoscopy N/A 05/12/2015    Procedure: COLONOSCOPY;  Surgeon: Milus Banister, MD;  Location: [place];  Service: Endoscopy;  Laterality: N/A;  . Cholecystectomy N/A 05/20/2015    Procedure: LAPAROSCOPIC CHOLECYSTECTOMY;  Surgeon: Coralie Keens, MD;  Location: [facility] OR;  Service: General;  Laterality: N/A;   Family History  Problem Relation Age of Onset  . Diabetes      brothers and sisters  . Hypertension Sister   . Coronary artery disease    . Stroke Mother   . Hypertension Mother   . Cancer Mother   . Heart attack Sister    Social History  Substance Use Topics  . Smoking status: Former Smoker -- 0.50 packs/day for 20 years    Types: Cigarettes  . Smokeless tobacco: Never Used     Comment: "quit smoking cigarettes in the 1970's  . Alcohol Use: No    Review of Systems  Respiratory: Positive for shortness of breath.   Cardiovascular: Negative for  chest pain.  All other systems reviewed and are negative.     Allergies  Review of patient's allergies indicates no known allergies.  Home Medications   Prior to Admission medications   Medication Sig Start Date End Date Taking? Authorizing Provider  amLODipine (NORVASC) 10 MG tablet TAKE 1 TABLET DAILY 02/18/15   Marletta Lor, MD  aspirin EC 81 MG tablet Take 81 mg by mouth at bedtime.    Historical Provider, MD  carvedilol (COREG) 25 MG tablet Take 1 tablet (25 mg total) by mouth 2 (two) times daily with a meal. 01/01/15   Almyra Deforest, PA  cloNIDine (CATAPRES) 0.1 MG tablet TAKE 1 TABLET AT  BEDTIME 04/06/15   Marletta Lor, MD  furosemide (LASIX) 40 MG tablet Take one-half tablet by mouth in the AM and one tablet by mouth in the PM 04/02/15   Belva Crome, MD  isosorbide mononitrate (IMDUR) 60 MG 24 hr tablet Take 1 tablet (60 mg total) by mouth daily. 05/14/15   Reyne Dumas, MD  LANTUS SOLOSTAR 100 UNIT/ML Solostar Pen INJECT 14 UNITS INTO THE SKIN DAILY AT 10 PM. 04/02/15   Marletta Lor, MD  nitroGLYCERIN (NITROSTAT) 0.4 MG SL tablet Place 0.4 mg under the tongue every 5 (five) minutes as needed for chest pain (3 doses MAX).    Historical Provider, MD  NOVOLOG FLEXPEN 100 UNIT/ML FlexPen Inject 14 Units into the skin 3 (three) times daily. 03/03/15   Historical Provider, MD  pantoprazole (PROTONIX) 40 MG tablet Take 1 tablet (40 mg total) by mouth daily. 05/14/15   Reyne Dumas, MD  pentoxifylline (TRENTAL) 400 MG CR tablet TAKE 1 TABLET TWICE DAILY  AT  10AM  AND  5PM 04/02/15   Marletta Lor, MD  polyethylene glycol [facility] Floria Raveling) packet Take 17 g by mouth daily as needed for mild constipation or moderate constipation. 05/21/15   Ripudeep Krystal Eaton, MD  Potassium Chloride ER 20 MEQ TBCR Take 20 mEq by mouth daily. 04/27/15   Marletta Lor, MD  promethazine (PHENERGAN) 12.5 MG tablet Take 1 tablet (12.5 mg total) by mouth every 6 (six) hours as needed for nausea or vomiting. 05/21/15   Ripudeep Krystal Eaton, MD  timolol (TIMOPTIC) 0.5 % ophthalmic solution Place 1 drop into both eyes every morning. 04/27/15   Historical Provider, MD  traMADol (ULTRAM) 50 MG tablet Take 1 tablet (50 mg total) by mouth every 8 (eight) hours as needed for moderate pain or severe pain. 05/21/15   Ripudeep Krystal Eaton, MD  TRAVATAN Z 0.004 % SOLN ophthalmic solution Place 1 drop into both eyes every evening. 04/27/15   Historical Provider, MD   BP 134/69 mmHg  Pulse 60  Temp(Src) 97.7 F (36.5 C) (Oral)  Resp 22  Wt 175 lb (79.379 kg)  SpO2 98% Physical Exam  Constitutional: He is oriented to person,  place, and time. He appears well-developed and well-nourished. No distress.  HENT:  Head: Normocephalic and atraumatic.  Eyes: Conjunctivae are normal.  Neck: Neck supple. No tracheal deviation present.  Cardiovascular: Normal rate, regular rhythm and normal heart sounds.   No murmur heard. Pulmonary/Chest: Effort normal. No respiratory distress. He has no wheezes. He has no rales.  Abdominal: Soft. He exhibits no distension. There is no tenderness.  Neurological: He is alert and oriented to person, place, and time.  Skin: Skin is warm and dry.  Psychiatric: He has a normal mood and affect.  Vitals reviewed.   ED  Course  Procedures (including critical care time) Labs Review Labs Reviewed  CBC WITH DIFFERENTIAL/PLATELET - Abnormal; Notable for the following:    RBC 3.30 (*)    Hemoglobin 9.1 (*)    HCT 27.7 (*)    RDW 17.4 (*)    All other components within normal limits  BASIC METABOLIC PANEL - Abnormal; Notable for the following:    BUN 39 (*)    Creatinine, Ser 3.06 (*)    GFR calc non Af Amer 18 (*)    GFR calc Af Amer 21 (*)    All other components within normal limits  BRAIN NATRIURETIC PEPTIDE - Abnormal; Notable for the following:    B Natriuretic Peptide 1089.1 (*)    All other components within normal limits  I-STAT TROPOININ, ED    Imaging Review Dg Chest 2 View  05/25/2015  CLINICAL DATA:  Shortness of breath, no complaints of coughing and congestion EXAM: CHEST  2 VIEW COMPARISON:  05/17/2015 FINDINGS: There is mild interstitial thickening. There is no focal consolidation. There are trace bilateral pleural effusions. There is no pneumothorax. The heart and mediastinal contours are unremarkable. There is evidence of prior CABG. The osseous structures are unremarkable. IMPRESSION: Cardiomegaly with mild pulmonary vascular congestion. Electronically Signed   By: Kathreen Devoid   On: 05/25/2015 13:09   I have personally reviewed and evaluated these images and lab  results as part of my medical decision-making.   EKG Interpretation   Date/Time:  Monday May 25 2015 11:53:54 EDT Ventricular Rate:  53 PR Interval:  210 QRS Duration: 104 QT Interval:  456 QTC Calculation: 427 R Axis:   -54 Text Interpretation:  Sinus bradycardia with 1st degree A-V block with  occasional Premature ventricular complexes and Possible Premature atrial  complexes with Abberant conduction Left axis deviation Abnormal ECG  Nonspecific T wave abnormality unchanged from prior Confirmed by Ashleigh Luckow MD,  Loni Delbridge [id number]) on 05/25/2015 12:42:55 PM      MDM   Final diagnoses:  Acute on chronic combined systolic and diastolic heart failure ([place])    82 year old male history of ischemic cardiomyopathy and resting EF of 40-45% presents with shortness of breath that occurred last night while laying flat. He has noticed a slight weight gain of 10 pounds over an unspecified time period. He has mild pulmonary vascular congestion on x-ray, no notable EKG changes, is not acutely symptomatic here and is oxygenating appropriately without supplemental O2. Suspect mild fluid overload and worsening heart failure.   His troponin is negative, he is not having any chest pain or any active signs of ischemia currently to suggest an underlying etiology for his worsening heart failure. BNP is elevated which is confirmatory of likely exacerbation. I discussed potential management with the patient and his family and it appears he has a cardiology clinic follow-up appointment scheduled for tomorrow. He is on 20 mg of Lasix in the morning and 40 at night, he was given 40 mg IV here and recommended to continue his home regimen adding 40 mg for a total of 60 in the morning tomorrow prior to his clinic visit where they can decide how to titrate his diuresis. Following Lasix administration here the patient was able to urinate adequately, continued to have no hypoxemia or vital sign abnormalities. Return precautions  discussed for worsening or new concerning symptoms.  Leo Grosser, MD 05/25/15 [phone]

## 2015-05-25 NOTE — Discharge Instructions (Signed)

## 2015-05-25 NOTE — ED Notes (Signed)
Patient transported to X-ray 

## 2015-05-25 NOTE — ED Notes (Signed)
Patient alert and oriented at discharge.  Vital signs stable.  Ambulatory to the waiting room with this RN.

## 2015-05-25 NOTE — ED Notes (Signed)
Pt sts SOB and feet swelling x 2 days; pt denies pain

## 2015-05-26 ENCOUNTER — Ambulatory Visit (INDEPENDENT_AMBULATORY_CARE_PROVIDER_SITE_OTHER): Payer: Commercial Managed Care - HMO | Admitting: Physician Assistant

## 2015-05-26 ENCOUNTER — Encounter: Payer: Self-pay | Admitting: Physician Assistant

## 2015-05-26 VITALS — BP 122/60 | HR 72 | Ht 71.0 in | Wt 167.0 lb

## 2015-05-26 DIAGNOSIS — N189 Chronic kidney disease, unspecified: Secondary | ICD-10-CM

## 2015-05-26 DIAGNOSIS — I10 Essential (primary) hypertension: Secondary | ICD-10-CM

## 2015-05-26 DIAGNOSIS — R7989 Other specified abnormal findings of blood chemistry: Secondary | ICD-10-CM | POA: Diagnosis not present

## 2015-05-26 DIAGNOSIS — E785 Hyperlipidemia, unspecified: Secondary | ICD-10-CM

## 2015-05-26 DIAGNOSIS — I5022 Chronic systolic (congestive) heart failure: Secondary | ICD-10-CM | POA: Diagnosis not present

## 2015-05-26 DIAGNOSIS — I25810 Atherosclerosis of coronary artery bypass graft(s) without angina pectoris: Secondary | ICD-10-CM | POA: Diagnosis not present

## 2015-05-26 DIAGNOSIS — R945 Abnormal results of liver function studies: Secondary | ICD-10-CM

## 2015-05-26 DIAGNOSIS — Z8719 Personal history of other diseases of the digestive system: Secondary | ICD-10-CM

## 2015-05-26 NOTE — Patient Instructions (Addendum)
Medication Instructions:  CONTINUE DOSE OF LASIX 60 MG IN THE MORNING AND 40 MG IN THE PM  Labwork: 1 WEEK BMET  Testing/Procedures: NONE  Follow-Up: DR. Tamala Julian IN 4 WEEKS  Any Other Special Instructions Will Be Listed Below (If Applicable). PLEASE CALL THE OFFICE TO LET us KNOW WHAT IS THE DOSE OF IMDUR YOU ARE TAKING ; [phone]  If you need a refill on your cardiac medications before your next appointment, please call your pharmacy.

## 2015-05-27 ENCOUNTER — Ambulatory Visit: Payer: Commercial Managed Care - HMO | Admitting: Internal Medicine

## 2015-05-27 ENCOUNTER — Telehealth: Payer: Self-pay | Admitting: *Deleted

## 2015-05-27 MED ORDER — ISOSORBIDE MONONITRATE ER 60 MG PO TB24: 60.0000 mg | ORAL_TABLET | Freq: Every day | ORAL | Status: DC

## 2015-05-27 NOTE — Telephone Encounter (Signed)
Pt called back and stated that his imdur says 30 mg on your bottle, will take 2 until he is out and then will switch to the 60 mg.

## 2015-05-27 NOTE — Telephone Encounter (Signed)
RX FOR IMDUR 60 MG DAILY SENT TO [facility] # 90 X 3

## 2015-05-28 ENCOUNTER — Ambulatory Visit: Payer: Commercial Managed Care - HMO | Admitting: Cardiology

## 2015-05-29 ENCOUNTER — Ambulatory Visit (INDEPENDENT_AMBULATORY_CARE_PROVIDER_SITE_OTHER): Payer: Commercial Managed Care - HMO | Admitting: Internal Medicine

## 2015-05-29 ENCOUNTER — Encounter: Payer: Self-pay | Admitting: Internal Medicine

## 2015-05-29 VITALS — BP 132/70 | HR 72 | Temp 98.2°F | Resp 20 | Ht 71.0 in | Wt 168.0 lb

## 2015-05-29 DIAGNOSIS — E785 Hyperlipidemia, unspecified: Secondary | ICD-10-CM

## 2015-05-29 DIAGNOSIS — I5042 Chronic combined systolic (congestive) and diastolic (congestive) heart failure: Secondary | ICD-10-CM

## 2015-05-29 DIAGNOSIS — I10 Essential (primary) hypertension: Secondary | ICD-10-CM

## 2015-05-29 DIAGNOSIS — K2901 Acute gastritis with bleeding: Secondary | ICD-10-CM

## 2015-05-29 DIAGNOSIS — E1165 Type 2 diabetes mellitus with hyperglycemia: Secondary | ICD-10-CM

## 2015-05-29 DIAGNOSIS — E1121 Type 2 diabetes mellitus with diabetic nephropathy: Secondary | ICD-10-CM

## 2015-05-29 NOTE — Progress Notes (Signed)
Pre visit review using our clinic review tool, if applicable. No additional management support is needed unless otherwise documented below in the visit note. 

## 2015-05-29 NOTE — Patient Instructions (Signed)
Limit your sodium (Salt) intake   Please check your hemoglobin A1c every 3 months  Report any shortness of breath or significant weight gain  Cardiology follow-up as scheduled

## 2015-05-29 NOTE — Progress Notes (Signed)
Subjective:    Patient ID: [name], male    DOB: [birth year], 82 y.o.   MRN: [id number]  HPI Admit date: 05/17/2015 Discharge date: 05/21/2015  Discharge Diagnoses:   .Atypical epigastric/ chest pain due to cholelithiasis status post laparoscopic cholecystectomy  . Diabetes mellitus with renal complications ([place]) . Essential hypertension . Coronary atherosclerosis of autologous vein bypass graft . Elevated troponin . CAD (coronary artery disease) of bypass graft . Systolic and diastolic CHF, chronic ([place]) . Uncontrolled type 2 diabetes mellitus with diabetic nephropathy ([place]) . Cardiomyopathy, ischemic . Acute on chronic renal failure ([place]) . Pedal edema  Consults: Cardiology Gen. surgery  Recommendations for Outpatient Follow-up:  1. Hold Tylenol and statins 2. Please repeat CBC/CMET at next visit 3. Patient has follow-up appointment with general surgery.  Lab Results  Component Value Date   HGBA1C 6.9* 05/17/2015   82 year old patient who is seen today following a recent hospital discharge and for transitional care management.  Since his discharge, he was evaluated in the ED after an episode of what sounds like PND and was seen by cardiology the following day.  Today he feels well  Wt Readings from Last 3 Encounters:  05/29/15 168 lb (76.204 kg)  05/26/15 167 lb (75.751 kg)  05/25/15 175 lb (79.379 kg)   Hemoglobin A1c during the hospital.  6.9.  He generally feels well today.  No shortness of breath He has advanced coronary artery disease and ischemic cardiomyopathy.  He has chronic kidney disease and essential hypertension.   He has a diabetic eye examination every 6 months  Hospital records reviewed  Diabetic regimen includes Lantus 14 units at bedtime as well as 14 units of NovoLog 3 times daily.  Compliance is unclear  Past Medical History  Diagnosis Date  . CAD (coronary artery disease)     a. H/o MI in 1998;  b. s/p CABG 2003. c. 10/2014  NSTEMI/Cath: LM 75, LAD 144m/d, D1 95, D2 50, LCX 100ost/p, OM1 80, OM2 80, RCA 100p/m, RPDA fills via L->L collats, LIMA->LAD ok, VG->dRCA 100, VG->OM1->OM2 99 prox to OM1 insertion->recanalated ->Tx with heparin,  VG->D1 100-->Med Rx.  Marland Kitchen Hyperlipidemia   . Hypertensive heart disease   . Type II diabetes mellitus ([place])   . History of stomach ulcers 2013  . Arthritis     "touch in my fingers" (09/10/2014)  . B12 deficiency anemia     "stopped taking the shots in ~ 06/2014" (09/10/2014)  . Diabetes 1.5, managed as type 1 ([place])   . CKD (chronic kidney disease), stage IV ([place])   . Chronic systolic CHF (congestive heart failure) (HCC)     a. EF 40-45% in 10/2014 (previously normal in 08/2014).  . Ischemic cardiomyopathy     a. 10/2014 Echo: EF 40-45%.  Marland Kitchen PVD (peripheral vascular disease) ([place])     Social History   Social History  . Marital Status: Widowed    Spouse Name: N/A  . Number of Children: N/A  . Years of Education: N/A   Occupational History  . Retired    Social History Main Topics  . Smoking status: Former Smoker -- 0.50 packs/day for 20 years    Types: Cigarettes  . Smokeless tobacco: Never Used     Comment: "quit smoking cigarettes in the 1970's  . Alcohol Use: No  . Drug Use: No  . Sexual Activity: Not Currently   Other Topics Concern  . Not on file   Social History Narrative   Pt lives  alone, family nearby.    Past Surgical History  Procedure Laterality Date  . Esophagogastroduodenoscopy  06/10/2011    Procedure: ESOPHAGOGASTRODUODENOSCOPY (EGD);  Surgeon: Ladene Artist, MD,FACG;  Location: [facility] ENDOSCOPY;  Service: Endoscopy;  Laterality: N/A;  . Coronary artery bypass graft  2003    CABG X5  . Cardiac catheterization  2003;   . Coronary angioplasty  1998    [name] 07/13/2010  . Cataract extraction, bilateral Bilateral   . Cardiac catheterization N/A 11/07/2014    Left Heart Cath; Belva Crome, MD;   . Colonoscopy    . Colonoscopy N/A 05/12/2015     Procedure: COLONOSCOPY;  Surgeon: Milus Banister, MD;  Location: [place];  Service: Endoscopy;  Laterality: N/A;  . Cholecystectomy N/A 05/20/2015    Procedure: LAPAROSCOPIC CHOLECYSTECTOMY;  Surgeon: Coralie Keens, MD;  Location: [facility] OR;  Service: General;  Laterality: N/A;    Family History  Problem Relation Age of Onset  . Diabetes      brothers and sisters  . Hypertension Sister   . Coronary artery disease    . Stroke Mother   . Hypertension Mother   . Cancer Mother   . Heart attack Sister     No Known Allergies  Current Outpatient Prescriptions on File Prior to Visit  Medication Sig Dispense Refill  . amLODipine (NORVASC) 10 MG tablet Take 10 mg by mouth daily.    Marland Kitchen aspirin EC 81 MG tablet Take 81 mg by mouth at bedtime.    . carvedilol (COREG) 25 MG tablet Take 1 tablet (25 mg total) by mouth 2 (two) times daily with a meal. 60 tablet 0  . cloNIDine (CATAPRES) 0.1 MG tablet Take 0.1 mg by mouth at bedtime.    . furosemide (LASIX) 40 MG tablet Take one-half tablet by mouth in the morning and one tablet by mouth in the evening at bedtime    . isosorbide mononitrate (IMDUR) 60 MG 24 hr tablet Take 1 tablet (60 mg total) by mouth daily. 90 tablet 3  . LANTUS SOLOSTAR 100 UNIT/ML Solostar Pen INJECT 14 UNITS INTO THE SKIN DAILY AT 10 PM. 15 mL 3  . nitroGLYCERIN (NITROSTAT) 0.4 MG SL tablet Place 0.4 mg under the tongue every 5 (five) minutes as needed for chest pain (3 doses MAX).    Marland Kitchen NOVOLOG FLEXPEN 100 UNIT/ML FlexPen Inject 14 Units into the skin 3 (three) times daily.    . pantoprazole (PROTONIX) 40 MG tablet Take 1 tablet (40 mg total) by mouth daily. 30 tablet 1  . pentoxifylline (TRENTAL) 400 MG CR tablet TAKE 1 TABLET TWICE DAILY  AT  10AM  AND  5PM 180 tablet 3  . polyethylene glycol (MIRALAX / GLYCOLAX) packet Take 17 g by mouth daily as needed for mild constipation or moderate constipation. 30 each 1  . Potassium Chloride ER 20 MEQ TBCR Take 20 mEq by mouth  daily. 90 tablet 3  . promethazine (PHENERGAN) 12.5 MG tablet Take 1 tablet (12.5 mg total) by mouth every 6 (six) hours as needed for nausea or vomiting. 30 tablet 0  . timolol (TIMOPTIC) 0.5 % ophthalmic solution Place 1 drop into both eyes every morning.    . traMADol (ULTRAM) 50 MG tablet Take 1 tablet (50 mg total) by mouth every 8 (eight) hours as needed for moderate pain or severe pain. 30 tablet 0  . TRAVATAN Z 0.004 % SOLN ophthalmic solution Place 1 drop into both eyes every evening.  No current facility-administered medications on file prior to visit.    BP 132/70 mmHg  Pulse 72  Temp(Src) 98.2 F (36.8 C) (Oral)  Resp 20  Ht 5\' 11"  (1.803 m)  Wt 168 lb (76.204 kg)  BMI 23.44 kg/m2  SpO2 98%       Review of Systems  Constitutional: Negative for fever, chills, appetite change and fatigue.  HENT: Negative for congestion, dental problem, ear pain, hearing loss, sore throat, tinnitus, trouble swallowing and voice change.   Eyes: Negative for pain, discharge and visual disturbance.  Respiratory: Positive for shortness of breath. Negative for cough, chest tightness, wheezing and stridor.   Cardiovascular: Negative for chest pain, palpitations and leg swelling.  Gastrointestinal: Negative for nausea, vomiting, abdominal pain, diarrhea, constipation, blood in stool and abdominal distention.  Genitourinary: Negative for urgency, hematuria, flank pain, discharge, difficulty urinating and genital sores.  Musculoskeletal: Negative for myalgias, back pain, joint swelling, arthralgias, gait problem and neck stiffness.  Skin: Negative for rash.  Neurological: Positive for weakness. Negative for dizziness, syncope, speech difficulty, numbness and headaches.  Hematological: Negative for adenopathy. Does not bruise/bleed easily.  Psychiatric/Behavioral: Negative for behavioral problems and dysphoric mood. The patient is not nervous/anxious.        Objective:   Physical Exam    Constitutional: He is oriented to person, place, and time. He appears well-developed.  Blood pressure 130/64  HENT:  Head: Normocephalic.  Right Ear: External ear normal.  Left Ear: External ear normal.  Eyes: Conjunctivae and EOM are normal.  Neck: Normal range of motion.  Cardiovascular: Normal rate and normal heart sounds.   Pulmonary/Chest: Breath sounds normal. No respiratory distress. He has no wheezes. He has no rales.  O2 saturation 98  Abdominal: Bowel sounds are normal.  Nicely healing laparoscopic scars  Musculoskeletal: Normal range of motion. He exhibits no edema or tenderness.  Trace edema only  Neurological: He is alert and oriented to person, place, and time.  Psychiatric: He has a normal mood and affect. His behavior is normal.          Assessment & Plan:  Ischemic cardio myopathy.  Appears euvolemic Essential hypertension, stable Diabetes controlled Chronic kidney disease  No change in medical regimen Recheck 3 months

## 2015-06-03 ENCOUNTER — Other Ambulatory Visit (INDEPENDENT_AMBULATORY_CARE_PROVIDER_SITE_OTHER): Payer: Commercial Managed Care - HMO | Admitting: *Deleted

## 2015-06-03 DIAGNOSIS — R799 Abnormal finding of blood chemistry, unspecified: Secondary | ICD-10-CM | POA: Diagnosis not present

## 2015-06-03 DIAGNOSIS — I5022 Chronic systolic (congestive) heart failure: Secondary | ICD-10-CM

## 2015-06-03 DIAGNOSIS — E785 Hyperlipidemia, unspecified: Secondary | ICD-10-CM

## 2015-06-03 DIAGNOSIS — N189 Chronic kidney disease, unspecified: Secondary | ICD-10-CM | POA: Diagnosis not present

## 2015-06-03 DIAGNOSIS — I25810 Atherosclerosis of coronary artery bypass graft(s) without angina pectoris: Secondary | ICD-10-CM | POA: Diagnosis not present

## 2015-06-03 DIAGNOSIS — R945 Abnormal results of liver function studies: Secondary | ICD-10-CM

## 2015-06-03 DIAGNOSIS — R7989 Other specified abnormal findings of blood chemistry: Secondary | ICD-10-CM

## 2015-06-03 LAB — BASIC METABOLIC PANEL
BUN: 48 mg/dL — AB (ref 7–25)
CHLORIDE: 103 mmol/L (ref 98–110)
CO2: 24 mmol/L (ref 20–31)
CREATININE: 3.36 mg/dL — AB (ref 0.70–1.11)
Calcium: 8.9 mg/dL (ref 8.6–10.3)
GLUCOSE: 158 mg/dL — AB (ref 65–99)
POTASSIUM: 4.2 mmol/L (ref 3.5–5.3)
Sodium: 141 mmol/L (ref 135–146)

## 2015-06-03 LAB — HEPATIC FUNCTION PANEL
ALT: 14 U/L (ref 9–46)
AST: 12 U/L (ref 10–35)
Albumin: 3.9 g/dL (ref 3.6–5.1)
Alkaline Phosphatase: 101 U/L (ref 40–115)
Bilirubin, Direct: 0.1 mg/dL (ref <=0.2)
Indirect Bilirubin: 0.3 mg/dL (ref 0.2–1.2)
TOTAL PROTEIN: 6.2 g/dL (ref 6.1–8.1)
Total Bilirubin: 0.4 mg/dL (ref 0.2–1.2)

## 2015-06-03 NOTE — Addendum Note (Signed)
Addended by: Eulis Foster on: 06/03/2015 10:19 AM   Modules accepted: Orders

## 2015-06-04 ENCOUNTER — Telehealth: Payer: Self-pay | Admitting: *Deleted

## 2015-06-04 ENCOUNTER — Telehealth: Payer: Self-pay

## 2015-06-04 DIAGNOSIS — E785 Hyperlipidemia, unspecified: Secondary | ICD-10-CM

## 2015-06-04 DIAGNOSIS — I2510 Atherosclerotic heart disease of native coronary artery without angina pectoris: Secondary | ICD-10-CM

## 2015-06-04 MED ORDER — ATORVASTATIN CALCIUM 80 MG PO TABS: 80.0000 mg | ORAL_TABLET | Freq: Every day | ORAL | Status: DC

## 2015-06-04 NOTE — Telephone Encounter (Signed)
Per Richardson Dopp, PA 4 wk f/u appt scheduled with Dr.Smith for 5/8 @ 9:45am. Pt aware and voiced appreciation for the call

## 2015-06-04 NOTE — Telephone Encounter (Signed)
Pt notified of lab results and recommedation to resume Lipitor 80 mg daily, bmet, lft 4/21. Pt agreeable to plan of care w/verbal understanding.

## 2015-06-18 DIAGNOSIS — N184 Chronic kidney disease, stage 4 (severe): Secondary | ICD-10-CM | POA: Diagnosis not present

## 2015-06-18 DIAGNOSIS — D649 Anemia, unspecified: Secondary | ICD-10-CM | POA: Diagnosis not present

## 2015-06-19 ENCOUNTER — Other Ambulatory Visit (INDEPENDENT_AMBULATORY_CARE_PROVIDER_SITE_OTHER): Payer: Commercial Managed Care - HMO | Admitting: *Deleted

## 2015-06-19 ENCOUNTER — Telehealth: Payer: Self-pay | Admitting: *Deleted

## 2015-06-19 DIAGNOSIS — I2510 Atherosclerotic heart disease of native coronary artery without angina pectoris: Secondary | ICD-10-CM | POA: Diagnosis not present

## 2015-06-19 DIAGNOSIS — E785 Hyperlipidemia, unspecified: Secondary | ICD-10-CM | POA: Diagnosis not present

## 2015-06-19 LAB — BASIC METABOLIC PANEL
BUN: 48 mg/dL — AB (ref 7–25)
CALCIUM: 9.5 mg/dL (ref 8.6–10.3)
CO2: 25 mmol/L (ref 20–31)
CREATININE: 3.25 mg/dL — AB (ref 0.70–1.11)
Chloride: 101 mmol/L (ref 98–110)
Glucose, Bld: 112 mg/dL — ABNORMAL HIGH (ref 65–99)
POTASSIUM: 4 mmol/L (ref 3.5–5.3)
Sodium: 135 mmol/L (ref 135–146)

## 2015-06-19 LAB — HEPATIC FUNCTION PANEL
ALT: 12 U/L (ref 9–46)
AST: 13 U/L (ref 10–35)
Albumin: 3.9 g/dL (ref 3.6–5.1)
Alkaline Phosphatase: 77 U/L (ref 40–115)
BILIRUBIN DIRECT: 0.1 mg/dL (ref <=0.2)
BILIRUBIN TOTAL: 0.4 mg/dL (ref 0.2–1.2)
Indirect Bilirubin: 0.3 mg/dL (ref 0.2–1.2)
Total Protein: 6.6 g/dL (ref 6.1–8.1)

## 2015-06-19 NOTE — Telephone Encounter (Signed)
Pt notified of lab results per Dr. Meda Coffee (DOD), pt verbalized understanding to results given.

## 2015-06-19 NOTE — Addendum Note (Signed)
Addended by: Eulis Foster on: 06/19/2015 09:33 AM   Modules accepted: Orders

## 2015-06-21 NOTE — Addendum Note (Signed)
Addended byKathlen Mody, Nicki Reaper T on: 06/21/2015 05:12 PM   Modules accepted: Orders

## 2015-06-22 ENCOUNTER — Telehealth: Payer: Self-pay | Admitting: *Deleted

## 2015-06-22 DIAGNOSIS — E785 Hyperlipidemia, unspecified: Secondary | ICD-10-CM

## 2015-06-22 DIAGNOSIS — I2510 Atherosclerotic heart disease of native coronary artery without angina pectoris: Secondary | ICD-10-CM

## 2015-06-22 NOTE — Telephone Encounter (Signed)
Pt notified today per Brynda Rim. PA that he will need FLP/LFT 2 months. I asked pt would he like to get this lab work 6/28 when he sees PCP Dr. Sharren Bridge. Pt agreeable to this plan of care. Orders placed in Epic.

## 2015-06-29 DIAGNOSIS — I10 Essential (primary) hypertension: Secondary | ICD-10-CM | POA: Diagnosis not present

## 2015-06-29 DIAGNOSIS — N184 Chronic kidney disease, stage 4 (severe): Secondary | ICD-10-CM | POA: Diagnosis not present

## 2015-06-29 DIAGNOSIS — I251 Atherosclerotic heart disease of native coronary artery: Secondary | ICD-10-CM | POA: Diagnosis not present

## 2015-06-29 DIAGNOSIS — D649 Anemia, unspecified: Secondary | ICD-10-CM | POA: Diagnosis not present

## 2015-07-06 ENCOUNTER — Encounter: Payer: Self-pay | Admitting: Interventional Cardiology

## 2015-07-06 ENCOUNTER — Ambulatory Visit (INDEPENDENT_AMBULATORY_CARE_PROVIDER_SITE_OTHER): Payer: Commercial Managed Care - HMO | Admitting: Interventional Cardiology

## 2015-07-06 VITALS — BP 126/62 | HR 68 | Ht 71.0 in | Wt 167.6 lb

## 2015-07-06 DIAGNOSIS — E785 Hyperlipidemia, unspecified: Secondary | ICD-10-CM | POA: Diagnosis not present

## 2015-07-06 DIAGNOSIS — Z8719 Personal history of other diseases of the digestive system: Secondary | ICD-10-CM

## 2015-07-06 DIAGNOSIS — I2510 Atherosclerotic heart disease of native coronary artery without angina pectoris: Secondary | ICD-10-CM

## 2015-07-06 DIAGNOSIS — N189 Chronic kidney disease, unspecified: Secondary | ICD-10-CM

## 2015-07-06 DIAGNOSIS — I5022 Chronic systolic (congestive) heart failure: Secondary | ICD-10-CM

## 2015-07-06 NOTE — Patient Instructions (Signed)
Medication Instructions:  Your physician has recommended you make the following change in your medication:  REDUCE Imdur to 60mg  daily   Labwork: None ordered  Testing/Procedures: None ordered  Follow-Up: Your physician recommends that you schedule a follow-up appointment in: 3 months   Any Other Special Instructions Will Be Listed Below (If Applicable). Call the office if you experience any angina [phone]   If you need a refill on your cardiac medications before your next appointment, please call your pharmacy.

## 2015-07-06 NOTE — Progress Notes (Signed)
Cardiology Office Note   Date:  07/06/2015   ID:  [name], DOB 11-23-1931, MRN [id number]  PCP:  [name], MD  Cardiologist:  [name], MD   Chief Complaint  Patient presents with  . Coronary Artery Disease      History of Present Illness: [name] is a 82 y.o. male who presents for CAD, bypass graft failure with only remaining conduit LIMA to LAD by cath in September 2016, chronic kidney disease stage IV, diabetes mellitus, chronic systolic heart failure with LVEF 40%, peripheral arterial disease, hypertension, and hyperlipidemia. Recent history of stomach ulcers with GI bleeding while on Plavix.  Since I last saw [name] has been hospitalized twice. In early March he was hospitalized with gastrointestinal bleeding. Plavix was discontinued. He was found to have gastric ulcers. Shortly after discharge, he was rehospitalized with abdominal pain and volume overload. He was found to have cholelithiasis and underwent successful laparoscopic cholecystectomy. No cardiac complications.  Since discharge from the hospital the second time, he is doing well. He's had no recurrence of angina. He did have concern about instruction by team members to decrease isosorbide to 60 mg per day. He has resumed the dose that he was on prior to hospitalization which was 90 mg per day. He has not had angina on either the 60 or the 90 mg dose.    Past Medical History  Diagnosis Date  . CAD (coronary artery disease)     a. H/o MI in 1998;  b. s/p CABG 2003. c. 10/2014 NSTEMI/Cath: LM 75, LAD 156m/d, D1 95, D2 50, LCX 100ost/p, OM1 80, OM2 80, RCA 100p/m, RPDA fills via L->L collats, LIMA->LAD ok, VG->dRCA 100, VG->OM1->OM2 99 prox to OM1 insertion->recanalated ->Tx with heparin,  VG->D1 100-->Med Rx.  Marland Kitchen Hyperlipidemia   . Hypertensive heart disease   . Type II diabetes mellitus ([place])   . History of stomach ulcers 2013  . Arthritis     "touch in my fingers"  (09/10/2014)  . B12 deficiency anemia     "stopped taking the shots in ~ 06/2014" (09/10/2014)  . Diabetes 1.5, managed as type 1 ([place])   . CKD (chronic kidney disease), stage IV ([place])   . Chronic systolic CHF (congestive heart failure) (HCC)     a. EF 40-45% in 10/2014 (previously normal in 08/2014).  . Ischemic cardiomyopathy     a. 10/2014 Echo: EF 40-45%.  Marland Kitchen PVD (peripheral vascular disease) [facility])     Past Surgical History  Procedure Laterality Date  . Esophagogastroduodenoscopy  06/10/2011    Procedure: ESOPHAGOGASTRODUODENOSCOPY (EGD);  Surgeon: Ladene Artist, MD,FACG;  Location: [facility] ENDOSCOPY;  Service: Endoscopy;  Laterality: N/A;  . Coronary artery bypass graft  2003    CABG X5  . Cardiac catheterization  2003;   . Coronary angioplasty  1998    [name] 07/13/2010  . Cataract extraction, bilateral Bilateral   . Cardiac catheterization N/A 11/07/2014    Left Heart Cath; Belva Crome, MD;   . Colonoscopy    . Colonoscopy N/A 05/12/2015    Procedure: COLONOSCOPY;  Surgeon: Milus Banister, MD;  Location: [place];  Service: Endoscopy;  Laterality: N/A;  . Cholecystectomy N/A 05/20/2015    Procedure: LAPAROSCOPIC CHOLECYSTECTOMY;  Surgeon: Coralie Keens, MD;  Location: [place];  Service: General;  Laterality: N/A;     Current Outpatient Prescriptions  Medication Sig Dispense Refill  . amLODipine (NORVASC) 10 MG tablet Take 10 mg by mouth daily.    Marland Kitchen  aspirin EC 81 MG tablet Take 81 mg by mouth at bedtime.    Marland Kitchen atorvastatin (LIPITOR) 80 MG tablet Take 1 tablet (80 mg total) by mouth daily.    . carvedilol (COREG) 25 MG tablet Take 1 tablet (25 mg total) by mouth 2 (two) times daily with a meal. 60 tablet 0  . cloNIDine (CATAPRES) 0.1 MG tablet Take 0.1 mg by mouth at bedtime.    . furosemide (LASIX) 40 MG tablet Take one-half tablet by mouth in the morning and one tablet by mouth in the evening at bedtime    . isosorbide mononitrate (IMDUR) 60 MG 24 hr tablet Take 1 tablet (60 mg  total) by mouth daily. 90 tablet 3  . LANTUS SOLOSTAR 100 UNIT/ML Solostar Pen INJECT 14 UNITS INTO THE SKIN DAILY AT 10 PM. 15 mL 3  . nitroGLYCERIN (NITROSTAT) 0.4 MG SL tablet Place 0.4 mg under the tongue every 5 (five) minutes as needed for chest pain (3 doses MAX).    Marland Kitchen NOVOLOG FLEXPEN 100 UNIT/ML FlexPen Inject 14 Units into the skin 3 (three) times daily.    . pantoprazole (PROTONIX) 40 MG tablet Take 1 tablet (40 mg total) by mouth daily. 30 tablet 1  . pentoxifylline (TRENTAL) 400 MG CR tablet TAKE 1 TABLET TWICE DAILY  AT  10AM  AND  5PM 180 tablet 3  . polyethylene glycol (MIRALAX / GLYCOLAX) packet Take 17 g by mouth daily as needed for mild constipation or moderate constipation. 30 each 1  . Potassium Chloride ER 20 MEQ TBCR Take 20 mEq by mouth daily. 90 tablet 3  . promethazine (PHENERGAN) 12.5 MG tablet Take 1 tablet (12.5 mg total) by mouth every 6 (six) hours as needed for nausea or vomiting. 30 tablet 0  . timolol (TIMOPTIC) 0.5 % ophthalmic solution Place 1 drop into both eyes every morning.    . traMADol (ULTRAM) 50 MG tablet Take 1 tablet (50 mg total) by mouth every 8 (eight) hours as needed for moderate pain or severe pain. 30 tablet 0  . TRAVATAN Z 0.004 % SOLN ophthalmic solution Place 1 drop into both eyes every evening.     No current facility-administered medications for this visit.    Allergies:   Review of patient's allergies indicates no known allergies.    Social History:  The patient  reports that he has quit smoking. His smoking use included Cigarettes. He has a 10 pack-year smoking history. He has never used smokeless tobacco. He reports that he does not drink alcohol or use illicit drugs.   Family History:  The patient's family history includes Cancer in his mother; Heart attack in his sister; Hypertension in his mother and sister; Stroke in his mother.    ROS:  Please see the history of present illness.   Otherwise, review of systems are positive for  Abdominal soreness of the incision sites from cholecystectomy..   All other systems are reviewed and negative.    PHYSICAL EXAM: VS:  BP 126/62 mmHg  Pulse 68  Ht 5\' 11"  (1.803 m)  Wt 167 lb 9.6 oz (76.023 kg)  BMI 23.39 kg/m2 , BMI Body mass index is 23.39 kg/(m^2). GEN: Well nourished, well developed, in no acute distress HEENT: normal Neck: no JVD, carotid bruits, or masses Cardiac: R RR.  There is no murmur, rub, or gallop. There is no edema. Respiratory:  clear to auscultation bilaterally, normal work of breathing. GI: soft, nontender, nondistended, + BS MS: no deformity or atrophy  Skin: warm and dry, no rash Neuro:  Strength and sensation are intact Psych: euthymic mood, full affect   EKG:  EKG is not ordered today.    Recent Labs: 12/29/2014: TSH 2.846 05/18/2015: Magnesium 1.7 05/25/2015: B Natriuretic Peptide 1089.1*; Hemoglobin 9.1*; Platelets 311 06/19/2015: ALT 12; BUN 48*; Creat 3.25*; Potassium 4.0; Sodium 135    Lipid Panel    Component Value Date/Time   CHOL 81 05/17/2015 2329   TRIG 111 05/17/2015 2329   HDL 22* 05/17/2015 2329   CHOLHDL 3.7 05/17/2015 2329   VLDL 22 05/17/2015 2329   LDLCALC 37 05/17/2015 2329   LDLDIRECT 93.3 01/22/2013 0854      Wt Readings from Last 3 Encounters:  07/06/15 167 lb 9.6 oz (76.023 kg)  05/29/15 168 lb (76.204 kg)  05/26/15 167 lb (75.751 kg)      Other studies Reviewed: Additional studies/ records that were reviewed today include: Review of records from GI bleeding admission. Heart failure admission and cholecystectomy admission.. The findings include no acute cardiac complications during any of these stays..    ASSESSMENT AND PLAN:  1. CKD (chronic kidney disease), unspecified stage Creatinine is running around 3.5.  2. Coronary artery disease involving native coronary artery of native heart without angina pectoris No angina on 60-90 mg of isosorbide per day. Last instruction from team at discharge was 60  mg daily but he has increased the dose back to 90 on his own.  3. Chronic systolic heart failure (HCC) No evidence of volume overload  4. Hyperlipidemia Followed by primary care  5. History of lower GI bleeding Off Plavix and with no recurrence of bleeding    Current medicines are reviewed at length with the patient today.  The patient has the following concerns regarding medicines: None.  The following changes/actions have been instituted:    Officially decrease isosorbide to 60 mg per day.  He is instructed to call us if he begins having angina related to the decrease in the medical regimen  1 cm back in follow-up in 3 months.  Labs/ tests ordered today include:  No orders of the defined types were placed in this encounter.     Disposition:   FU with HS in 3 months  Signed, [name], MD  07/06/2015 10:26 AM    [facility] [place], [place], [place]  [address] Phone: [phone]; Fax: [phone]

## 2015-08-10 DIAGNOSIS — H401122 Primary open-angle glaucoma, left eye, moderate stage: Secondary | ICD-10-CM | POA: Diagnosis not present

## 2015-08-10 DIAGNOSIS — H401112 Primary open-angle glaucoma, right eye, moderate stage: Secondary | ICD-10-CM | POA: Diagnosis not present

## 2015-08-26 ENCOUNTER — Ambulatory Visit (INDEPENDENT_AMBULATORY_CARE_PROVIDER_SITE_OTHER): Payer: Commercial Managed Care - HMO | Admitting: Internal Medicine

## 2015-08-26 ENCOUNTER — Encounter: Payer: Self-pay | Admitting: Internal Medicine

## 2015-08-26 VITALS — BP 140/68 | HR 64 | Temp 98.3°F | Resp 18 | Ht 71.0 in | Wt 166.0 lb

## 2015-08-26 DIAGNOSIS — I5023 Acute on chronic systolic (congestive) heart failure: Secondary | ICD-10-CM

## 2015-08-26 DIAGNOSIS — N189 Chronic kidney disease, unspecified: Secondary | ICD-10-CM

## 2015-08-26 DIAGNOSIS — E1121 Type 2 diabetes mellitus with diabetic nephropathy: Secondary | ICD-10-CM | POA: Diagnosis not present

## 2015-08-26 DIAGNOSIS — E1165 Type 2 diabetes mellitus with hyperglycemia: Secondary | ICD-10-CM

## 2015-08-26 DIAGNOSIS — I10 Essential (primary) hypertension: Secondary | ICD-10-CM

## 2015-08-26 DIAGNOSIS — E538 Deficiency of other specified B group vitamins: Secondary | ICD-10-CM | POA: Diagnosis not present

## 2015-08-26 DIAGNOSIS — N179 Acute kidney failure, unspecified: Secondary | ICD-10-CM | POA: Diagnosis not present

## 2015-08-26 DIAGNOSIS — I2510 Atherosclerotic heart disease of native coronary artery without angina pectoris: Secondary | ICD-10-CM | POA: Diagnosis not present

## 2015-08-26 LAB — HEMOGLOBIN A1C: Hgb A1c MFr Bld: 8.1 % — ABNORMAL HIGH (ref 4.6–6.5)

## 2015-08-26 NOTE — Patient Instructions (Signed)
Limit your sodium (Salt) intake   Please check your hemoglobin A1c every 3 months    It is important that you exercise regularly, at least 20 minutes 3 to 4 times per week.  If you develop chest pain or shortness of breath seek  medical attention.  Follow-up renal medicine

## 2015-08-26 NOTE — Progress Notes (Signed)
Subjective:    Patient ID: [name], male    DOB: [birth year], 82 y.o.   MRN: [id number]  HPI  Lab Results  Component Value Date   HGBA1C 6.9* 05/17/2015   82 year old patient who is seen for his quarterly follow-up.  He has diabetes, CAD by chronic kidney disease.  Remains on Lantus 20 units at bedtime.  He also is on mealtime insulin 14 units 2 or 3 times daily.  No documented hypoglycemia, but he does require an occasional snack when he feels his blood pressure is getting too low.  Last hemoglobin A1c nicely controlled He has chronic kidney disease and is followed by nephrology He has coronary artery disease status post CABG and ischemic cardiomyopathy.  He was seen by cardiology last month  In general doing well.  He has essential hypertension.  Remains on statin therapy, which he continues to tolerate well  Past Medical History  Diagnosis Date  . CAD (coronary artery disease)     a. H/o MI in 1998;  b. s/p CABG 2003. c. 10/2014 NSTEMI/Cath: LM 75, LAD 153m/d, D1 95, D2 50, LCX 100ost/p, OM1 80, OM2 80, RCA 100p/m, RPDA fills via L->L collats, LIMA->LAD ok, VG->dRCA 100, VG->OM1->OM2 99 prox to OM1 insertion->recanalated ->Tx with heparin,  VG->D1 100-->Med Rx.  Marland Kitchen Hyperlipidemia   . Hypertensive heart disease   . Type II diabetes mellitus ([place])   . History of stomach ulcers 2013  . Arthritis     "touch in my fingers" (09/10/2014)  . B12 deficiency anemia     "stopped taking the shots in ~ 06/2014" (09/10/2014)  . Diabetes 1.5, managed as type 1 ([place])   . CKD (chronic kidney disease), stage IV ([place])   . Chronic systolic CHF (congestive heart failure) (HCC)     a. EF 40-45% in 10/2014 (previously normal in 08/2014).  . Ischemic cardiomyopathy     a. 10/2014 Echo: EF 40-45%.  Marland Kitchen PVD (peripheral vascular disease) ([place])      Social History   Social History  . Marital Status: Widowed    Spouse Name: N/A  . Number of Children: N/A  . Years of Education: N/A    Occupational History  . Retired    Social History Main Topics  . Smoking status: Former Smoker -- 0.50 packs/day for 20 years    Types: Cigarettes  . Smokeless tobacco: Never Used     Comment: "quit smoking cigarettes in the 1970's  . Alcohol Use: No  . Drug Use: No  . Sexual Activity: Not Currently   Other Topics Concern  . Not on file   Social History Narrative   Pt lives alone, family nearby.    Past Surgical History  Procedure Laterality Date  . Esophagogastroduodenoscopy  06/10/2011    Procedure: ESOPHAGOGASTRODUODENOSCOPY (EGD);  Surgeon: Ladene Artist, MD,FACG;  Location: [facility] ENDOSCOPY;  Service: Endoscopy;  Laterality: N/A;  . Coronary artery bypass graft  2003    CABG X5  . Cardiac catheterization  2003;   . Coronary angioplasty  1998    [name] 07/13/2010  . Cataract extraction, bilateral Bilateral   . Cardiac catheterization N/A 11/07/2014    Left Heart Cath; Belva Crome, MD;   . Colonoscopy    . Colonoscopy N/A 05/12/2015    Procedure: COLONOSCOPY;  Surgeon: Milus Banister, MD;  Location: [place];  Service: Endoscopy;  Laterality: N/A;  . Cholecystectomy N/A 05/20/2015    Procedure: LAPAROSCOPIC CHOLECYSTECTOMY;  Surgeon: Coralie Keens, MD;  Location: [facility] OR;  Service: General;  Laterality: N/A;    Family History  Problem Relation Age of Onset  . Diabetes      brothers and sisters  . Hypertension Sister   . Coronary artery disease    . Stroke Mother   . Hypertension Mother   . Cancer Mother   . Heart attack Sister     No Known Allergies  Current Outpatient Prescriptions on File Prior to Visit  Medication Sig Dispense Refill  . amLODipine (NORVASC) 10 MG tablet Take 10 mg by mouth daily.    Marland Kitchen aspirin EC 81 MG tablet Take 81 mg by mouth at bedtime.    Marland Kitchen atorvastatin (LIPITOR) 80 MG tablet Take 1 tablet (80 mg total) by mouth daily.    . carvedilol (COREG) 25 MG tablet Take 1 tablet (25 mg total) by mouth 2 (two) times daily with a meal. 60  tablet 0  . cloNIDine (CATAPRES) 0.1 MG tablet Take 0.1 mg by mouth at bedtime.    . furosemide (LASIX) 40 MG tablet Take one-half tablet by mouth in the morning and one tablet by mouth in the evening at bedtime    . isosorbide mononitrate (IMDUR) 60 MG 24 hr tablet Take 1 tablet (60 mg total) by mouth daily. 90 tablet 3  . LANTUS SOLOSTAR 100 UNIT/ML Solostar Pen INJECT 14 UNITS INTO THE SKIN DAILY AT 10 PM. 15 mL 3  . nitroGLYCERIN (NITROSTAT) 0.4 MG SL tablet Place 0.4 mg under the tongue every 5 (five) minutes as needed for chest pain (3 doses MAX).    Marland Kitchen NOVOLOG FLEXPEN 100 UNIT/ML FlexPen Inject 14 Units into the skin 3 (three) times daily.    . pantoprazole (PROTONIX) 40 MG tablet Take 1 tablet (40 mg total) by mouth daily. 30 tablet 1  . pentoxifylline (TRENTAL) 400 MG CR tablet TAKE 1 TABLET TWICE DAILY  AT  10AM  AND  5PM 180 tablet 3  . polyethylene glycol (MIRALAX / GLYCOLAX) packet Take 17 g by mouth daily as needed for mild constipation or moderate constipation. 30 each 1  . Potassium Chloride ER 20 MEQ TBCR Take 20 mEq by mouth daily. 90 tablet 3  . timolol (TIMOPTIC) 0.5 % ophthalmic solution Place 1 drop into both eyes every morning.    . TRAVATAN Z 0.004 % SOLN ophthalmic solution Place 1 drop into both eyes every evening.     No current facility-administered medications on file prior to visit.    BP 140/68 mmHg  Pulse 64  Temp(Src) 98.3 F (36.8 C) (Oral)  Resp 18  Ht 5\' 11"  (1.803 m)  Wt 166 lb (75.297 kg)  BMI 23.16 kg/m2  SpO2 98%     Review of Systems  Constitutional: Negative for fever, chills, appetite change and fatigue.  HENT: Negative for congestion, dental problem, ear pain, hearing loss, sore throat, tinnitus, trouble swallowing and voice change.   Eyes: Negative for pain, discharge and visual disturbance.  Respiratory: Negative for cough, chest tightness, wheezing and stridor.   Cardiovascular: Negative for chest pain, palpitations and leg swelling.    Gastrointestinal: Negative for nausea, vomiting, abdominal pain, diarrhea, constipation, blood in stool and abdominal distention.  Genitourinary: Negative for urgency, hematuria, flank pain, discharge, difficulty urinating and genital sores.  Musculoskeletal: Negative for myalgias, back pain, joint swelling, arthralgias, gait problem and neck stiffness.  Skin: Negative for rash.  Neurological: Negative for dizziness, syncope, speech difficulty, weakness, numbness and headaches.  Hematological: Negative for adenopathy. Does  not bruise/bleed easily.  Psychiatric/Behavioral: Negative for behavioral problems and dysphoric mood. The patient is not nervous/anxious.        Objective:   Physical Exam  Constitutional: He is oriented to person, place, and time. He appears well-developed.  Blood pressure 130/70  HENT:  Head: Normocephalic.  Right Ear: External ear normal.  Left Ear: External ear normal.  Eyes: Conjunctivae and EOM are normal.  Neck: Normal range of motion.  Cardiovascular: Normal rate and normal heart sounds.   Pulmonary/Chest: Breath sounds normal.  Abdominal: Bowel sounds are normal.  Musculoskeletal: Normal range of motion. He exhibits no edema or tenderness.  Neurological: He is alert and oriented to person, place, and time.  Psychiatric: He has a normal mood and affect. His behavior is normal.          Assessment & Plan:   Diabetes mellitus.  Appears to be under reasonable control.  No significant hypoglycemic symptoms.  Will check hemoglobin A1c Essential hypertension, well-controlled Chronic kidney disease.  Follow-up nephrology Coronary artery disease.  Follow-up cardiology  Recheck here 3-4 months \  [name], MD

## 2015-08-26 NOTE — Progress Notes (Signed)
Pre visit review using our clinic review tool, if applicable. No additional management support is needed unless otherwise documented below in the visit note. 

## 2015-09-22 ENCOUNTER — Encounter: Payer: Self-pay | Admitting: Interventional Cardiology

## 2015-10-12 ENCOUNTER — Ambulatory Visit (INDEPENDENT_AMBULATORY_CARE_PROVIDER_SITE_OTHER): Payer: Commercial Managed Care - HMO | Admitting: Interventional Cardiology

## 2015-10-12 ENCOUNTER — Encounter: Payer: Self-pay | Admitting: Interventional Cardiology

## 2015-10-12 VITALS — BP 112/70 | HR 67 | Ht 71.0 in | Wt 167.8 lb

## 2015-10-12 DIAGNOSIS — I5042 Chronic combined systolic (congestive) and diastolic (congestive) heart failure: Secondary | ICD-10-CM | POA: Diagnosis not present

## 2015-10-12 DIAGNOSIS — I255 Ischemic cardiomyopathy: Secondary | ICD-10-CM

## 2015-10-12 DIAGNOSIS — N184 Chronic kidney disease, stage 4 (severe): Secondary | ICD-10-CM

## 2015-10-12 DIAGNOSIS — I25810 Atherosclerosis of coronary artery bypass graft(s) without angina pectoris: Secondary | ICD-10-CM | POA: Diagnosis not present

## 2015-10-12 DIAGNOSIS — I10 Essential (primary) hypertension: Secondary | ICD-10-CM | POA: Diagnosis not present

## 2015-10-12 DIAGNOSIS — I739 Peripheral vascular disease, unspecified: Secondary | ICD-10-CM

## 2015-10-12 NOTE — Patient Instructions (Signed)
Medication Instructions:  Your physician recommends that you continue on your current medications as directed. Please refer to the Current Medication list given to you today.   Labwork: None ordered.  Testing/Procedures: None ordered  Follow-Up: Your physician wants you to follow-up in 6 months. You will receive a reminder letter in the mail two months in advance. If you don't receive a letter, please call our office to schedule the follow-up appointment.   Any Other Special Instructions Will Be Listed Below (If Applicable).     If you need a refill on your cardiac medications before your next appointment, please call your pharmacy.

## 2015-10-12 NOTE — Progress Notes (Signed)
Cardiology Office Note    Date:  10/12/2015   ID:  NYXON SHERE, DOB November 14, 1931, MRN [id number]  PCP:  [name], MD  Cardiologist: [name], MD   Chief Complaint  Patient presents with  . Coronary Artery Disease  . Congestive Heart Failure    History of Present Illness:  [name] is a 82 y.o. male who presents for follow-up of CAD, bypass graft failure with only remaining conduit LIMA to LAD by cath in September 2016, chronic kidney disease stage IV, diabetes mellitus, chronic systolic heart failure with LVEF 40%, peripheral arterial disease, hypertension, and hyperlipidemia. Recent history of stomach ulcers with GI bleeding while on Plavix.  Chest by a from a family reunion in [place]. His only complaint is that he has limiting exertional dyspnea if he exerts beyond a certain level of intensity. He has not noticed lower extremity swelling and denies orthopnea. He exercises 3-4 times per week at his local [employer]. He has not needed to use nitroglycerin. He has not had syncope or palpitations.   Past Medical History:  Diagnosis Date  . Arthritis    "touch in my fingers" (09/10/2014)  . B12 deficiency anemia    "stopped taking the shots in ~ 06/2014" (09/10/2014)  . CAD (coronary artery disease)    a. H/o MI in 1998;  b. s/p CABG 2003. c. 10/2014 NSTEMI/Cath: LM 75, LAD 161m/d, D1 95, D2 50, LCX 100ost/p, OM1 80, OM2 80, RCA 100p/m, RPDA fills via L->L collats, LIMA->LAD ok, VG->dRCA 100, VG->OM1->OM2 99 prox to OM1 insertion->recanalated ->Tx with heparin,  VG->D1 100-->Med Rx.  . Chronic systolic CHF (congestive heart failure) ([place])    a. EF 40-45% in 10/2014 (previously normal in 08/2014).  . CKD (chronic kidney disease), stage IV ([place])   . Diabetes 1.5, managed as type 1 ([place])   . History of stomach ulcers 2013  . Hyperlipidemia   . Hypertensive heart disease   . Ischemic cardiomyopathy    a. 10/2014 Echo: EF 40-45%.  Marland Kitchen PVD (peripheral  vascular disease) ([place])   . Type II diabetes mellitus ([place])     Past Surgical History:  Procedure Laterality Date  . CARDIAC CATHETERIZATION  2003;   . CARDIAC CATHETERIZATION N/A 11/07/2014   Left Heart Cath; Belva Crome, MD;   . CATARACT EXTRACTION, BILATERAL Bilateral   . CHOLECYSTECTOMY N/A 05/20/2015   Procedure: LAPAROSCOPIC CHOLECYSTECTOMY;  Surgeon: Coralie Keens, MD;  Location: [place];  Service: General;  Laterality: N/A;  . COLONOSCOPY    . COLONOSCOPY N/A 05/12/2015   Procedure: COLONOSCOPY;  Surgeon: Milus Banister, MD;  Location: [place];  Service: Endoscopy;  Laterality: N/A;  . [place]   [name] 07/13/2010  . CORONARY ARTERY BYPASS GRAFT  2003   CABG X5  . ESOPHAGOGASTRODUODENOSCOPY  06/10/2011   Procedure: ESOPHAGOGASTRODUODENOSCOPY (EGD);  Surgeon: Ladene Artist, MD,FACG;  Location: [facility] ENDOSCOPY;  Service: Endoscopy;  Laterality: N/A;    Current Medications: Outpatient Medications Prior to Visit  Medication Sig Dispense Refill  . amLODipine (NORVASC) 10 MG tablet Take 10 mg by mouth daily.    Marland Kitchen aspirin EC 81 MG tablet Take 81 mg by mouth at bedtime.    Marland Kitchen atorvastatin (LIPITOR) 80 MG tablet Take 1 tablet (80 mg total) by mouth daily.    . carvedilol (COREG) 25 MG tablet Take 1 tablet (25 mg total) by mouth 2 (two) times daily with a meal. 60 tablet 0  .  cloNIDine (CATAPRES) 0.1 MG tablet Take 0.1 mg by mouth at bedtime.    . furosemide (LASIX) 40 MG tablet Take one and a half (1.5) tablet (60 mg total) by mouth each morning. Take one (1) tablet (40 mg total) by mouth each evening.    . isosorbide mononitrate (IMDUR) 60 MG 24 hr tablet Take 1 tablet (60 mg total) by mouth daily. 90 tablet 3  . nitroGLYCERIN (NITROSTAT) 0.4 MG SL tablet Place 0.4 mg under the tongue every 5 (five) minutes as needed for chest pain (3 doses MAX).    Marland Kitchen NOVOLOG FLEXPEN 100 UNIT/ML FlexPen Inject 14 Units into the skin 3 (three) times daily.    . pentoxifylline  (TRENTAL) 400 MG CR tablet TAKE 1 TABLET TWICE DAILY  AT  10AM  AND  5PM 180 tablet 3  . polyethylene glycol (MIRALAX / GLYCOLAX) packet Take 17 g by mouth daily as needed for mild constipation or moderate constipation. 30 each 1  . Potassium Chloride ER 20 MEQ TBCR Take 20 mEq by mouth daily. 90 tablet 3  . timolol (TIMOPTIC) 0.5 % ophthalmic solution Place 1 drop into both eyes every morning.    . TRAVATAN Z 0.004 % SOLN ophthalmic solution Place 1 drop into both eyes every evening.    Marland Kitchen LANTUS SOLOSTAR 100 UNIT/ML Solostar Pen INJECT 14 UNITS INTO THE SKIN DAILY AT 10 PM. (Patient not taking: Reported on 10/12/2015) 15 mL 3  . pantoprazole (PROTONIX) 40 MG tablet Take 1 tablet (40 mg total) by mouth daily. (Patient not taking: Reported on 10/12/2015) 30 tablet 1   No facility-administered medications prior to visit.      Allergies:   Review of patient's allergies indicates no known allergies.   Social History   Social History  . Marital status: Widowed    Spouse name: N/A  . Number of children: N/A  . Years of education: N/A   Occupational History  . Retired    Social History Main Topics  . Smoking status: Former Smoker    Packs/day: 0.50    Years: 20.00    Types: Cigarettes  . Smokeless tobacco: Never Used     Comment: "quit smoking cigarettes in the 1970's  . Alcohol use No  . Drug use: No  . Sexual activity: Not Currently   Other Topics Concern  . None   Social History Narrative   Pt lives alone, family nearby.     Family History:  The patient's family history includes Cancer in his mother; Heart attack in his sister; Hypertension in his mother and sister; Stroke in his mother.   ROS:   Please see the history of present illness.    Limitation to physical activity related to dyspnea. Otherwise unremarkable.  All other systems reviewed and are negative.   PHYSICAL EXAM:   VS:  BP 112/70   Pulse 67   Ht 5\' 11"  (1.803 m)   Wt 167 lb 12.8 oz (76.1 kg)   BMI 23.40  kg/m    GEN: Well nourished, well developed, in no acute distress  HEENT: normal  Neck: no JVD, carotid bruits, or masses Cardiac: RRR; no murmurs, rubs, or gallops,no edema  Respiratory:  clear to auscultation bilaterally, normal work of breathing GI: soft, nontender, nondistended, + BS MS: no deformity or atrophy  Skin: warm and dry, no rash Neuro:  Alert and Oriented x 3, Strength and sensation are intact Psych: euthymic mood, full affect  Wt Readings from Last 3 Encounters:  10/12/15 167 lb 12.8 oz (76.1 kg)  08/26/15 166 lb (75.3 kg)  07/06/15 167 lb 9.6 oz (76 kg)      Studies/Labs Reviewed:   EKG:  EKG  Not repeated  Recent Labs: 12/29/2014: TSH 2.846 05/18/2015: Magnesium 1.7 05/25/2015: B Natriuretic Peptide 1,089.1; Hemoglobin 9.1; Platelets 311 06/19/2015: ALT 12; BUN 48; Creat 3.25; Potassium 4.0; Sodium 135   Lipid Panel    Component Value Date/Time   CHOL 81 05/17/2015 2329   TRIG 111 05/17/2015 2329   HDL 22 (L) 05/17/2015 2329   CHOLHDL 3.7 05/17/2015 2329   VLDL 22 05/17/2015 2329   LDLCALC 37 05/17/2015 2329   LDLDIRECT 93.3 01/22/2013 0854    Additional studies/ records that were reviewed today include:  No new functional data    ASSESSMENT:    1. Coronary atherosclerosis of autologous vein bypass graft without angina   2. Systolic and diastolic CHF, chronic ([place])   3. Essential hypertension   4. Peripheral vascular disease ([place])   5. Cardiomyopathy, ischemic   6. CKD (chronic kidney disease), stage IV (HCC)      PLAN:  In order of problems listed above:  1. Exertional angina has not occurred but limiting dyspnea with moderate activity is anginal equivalent. He is active. He is exercising regularly. No adjustments in therapy of been made. 2. No evidence of volume overload. Reminded to be careful with salt intake. 3. Continue current regimen. Low salt diet reemphasized. 4. Denies claudication. Does not limit physical activity. 5. See  above 6. Stable according to the patient based upon his interaction with nephrology.    Medication Adjustments/Labs and Tests Ordered: Current medicines are reviewed at length with the patient today.  Concerns regarding medicines are outlined above.  Medication changes, Labs and Tests ordered today are listed in the Patient Instructions below. Patient Instructions  Medication Instructions:  Your physician recommends that you continue on your current medications as directed. Please refer to the Current Medication list given to you today.   Labwork: None ordered.  Testing/Procedures: None ordered  Follow-Up: Your physician wants you to follow-up in 6 months. You will receive a reminder letter in the mail two months in advance. If you don't receive a letter, please call our office to schedule the follow-up appointment.   Any Other Special Instructions Will Be Listed Below (If Applicable).     If you need a refill on your cardiac medications before your next appointment, please call your pharmacy.      Signed, [name], MD  10/12/2015 9:55 AM    [address] [place], [place], [place]  [address] Phone: [phone]; Fax: [phone]

## 2015-10-19 ENCOUNTER — Other Ambulatory Visit: Payer: Self-pay | Admitting: Internal Medicine

## 2015-10-19 ENCOUNTER — Other Ambulatory Visit: Payer: Self-pay | Admitting: Physician Assistant

## 2015-10-19 NOTE — Telephone Encounter (Signed)
Please review for refill. Thanks!  

## 2015-11-03 DIAGNOSIS — D649 Anemia, unspecified: Secondary | ICD-10-CM | POA: Diagnosis not present

## 2015-11-03 DIAGNOSIS — N2581 Secondary hyperparathyroidism of renal origin: Secondary | ICD-10-CM | POA: Diagnosis not present

## 2015-11-03 DIAGNOSIS — N184 Chronic kidney disease, stage 4 (severe): Secondary | ICD-10-CM | POA: Diagnosis not present

## 2015-11-11 DIAGNOSIS — N2581 Secondary hyperparathyroidism of renal origin: Secondary | ICD-10-CM | POA: Diagnosis not present

## 2015-11-11 DIAGNOSIS — D649 Anemia, unspecified: Secondary | ICD-10-CM | POA: Diagnosis not present

## 2015-11-11 DIAGNOSIS — I10 Essential (primary) hypertension: Secondary | ICD-10-CM | POA: Diagnosis not present

## 2015-11-11 DIAGNOSIS — N184 Chronic kidney disease, stage 4 (severe): Secondary | ICD-10-CM | POA: Diagnosis not present

## 2015-11-23 ENCOUNTER — Other Ambulatory Visit: Payer: Self-pay | Admitting: Internal Medicine

## 2015-11-30 ENCOUNTER — Other Ambulatory Visit: Payer: Self-pay | Admitting: Internal Medicine

## 2015-12-16 ENCOUNTER — Telehealth: Payer: Self-pay | Admitting: Internal Medicine

## 2015-12-16 NOTE — Telephone Encounter (Signed)
Pt request refill  glucose blood (ACCU-CHEK AVIVA PLUS) test strip  It has been a while since pt ordered. Pt  test 1 X /day.  [employer] mailorder

## 2015-12-17 MED ORDER — GLUCOSE BLOOD VI STRP: ORAL_STRIP | 12 refills | Status: DC

## 2015-12-17 NOTE — Telephone Encounter (Signed)
Spoke to pt, told him Rx for Test strips was sent to [facility] as requested. Pt verbalized understanding.

## 2015-12-28 ENCOUNTER — Ambulatory Visit (INDEPENDENT_AMBULATORY_CARE_PROVIDER_SITE_OTHER): Payer: Commercial Managed Care - HMO | Admitting: Internal Medicine

## 2015-12-28 ENCOUNTER — Encounter: Payer: Self-pay | Admitting: Internal Medicine

## 2015-12-28 VITALS — BP 120/64 | HR 72 | Temp 98.1°F | Resp 20 | Ht 71.0 in | Wt 169.2 lb

## 2015-12-28 DIAGNOSIS — Z23 Encounter for immunization: Secondary | ICD-10-CM | POA: Diagnosis not present

## 2015-12-28 DIAGNOSIS — E0821 Diabetes mellitus due to underlying condition with diabetic nephropathy: Secondary | ICD-10-CM | POA: Diagnosis not present

## 2015-12-28 DIAGNOSIS — I10 Essential (primary) hypertension: Secondary | ICD-10-CM

## 2015-12-28 DIAGNOSIS — I739 Peripheral vascular disease, unspecified: Secondary | ICD-10-CM

## 2015-12-28 DIAGNOSIS — I255 Ischemic cardiomyopathy: Secondary | ICD-10-CM

## 2015-12-28 DIAGNOSIS — Z794 Long term (current) use of insulin: Secondary | ICD-10-CM

## 2015-12-28 DIAGNOSIS — E785 Hyperlipidemia, unspecified: Secondary | ICD-10-CM

## 2015-12-28 NOTE — Progress Notes (Signed)
Pre visit review using our clinic review tool, if applicable. No additional management support is needed unless otherwise documented below in the visit note. 

## 2015-12-28 NOTE — Patient Instructions (Signed)
Limit your sodium (Salt) intake    It is important that you exercise regularly, at least 20 minutes 3 to 4 times per week.  If you develop chest pain or shortness of breath seek  medical attention.   Please check your hemoglobin A1c every 3 months   

## 2015-12-28 NOTE — Progress Notes (Signed)
Subjective:    Patient ID: [name], male    DOB: [birth year], 82 y.o.   MRN: [id number]  HPI 82 year old patient who is seen today for follow-up. He has insulin requiring diabetes.  Presently is on basal insulin 20 units daily.  Mealtime insulin 14 units 3 times daily.  States fasting blood sugars are generally in the 150 range.  Lowest fasting blood sugar 70 .  He has coronary artery disease and ischemic cardio myopathy which has been stable.  He has essential hypertension. He has PAD.  Denies any claudication  Past Medical History:  Diagnosis Date  . Arthritis    "touch in my fingers" (09/10/2014)  . B12 deficiency anemia    "stopped taking the shots in ~ 06/2014" (09/10/2014)  . CAD (coronary artery disease)    a. H/o MI in 1998;  b. s/p CABG 2003. c. 10/2014 NSTEMI/Cath: LM 75, LAD 153m/d, D1 95, D2 50, LCX 100ost/p, OM1 80, OM2 80, RCA 100p/m, RPDA fills via L->L collats, LIMA->LAD ok, VG->dRCA 100, VG->OM1->OM2 99 prox to OM1 insertion->recanalated ->Tx with heparin,  VG->D1 100-->Med Rx.  . Chronic systolic CHF (congestive heart failure) ([place])    a. EF 40-45% in 10/2014 (previously normal in 08/2014).  . CKD (chronic kidney disease), stage IV ([place])   . Diabetes 1.5, managed as type 1 ([place])   . History of stomach ulcers 2013  . Hyperlipidemia   . Hypertensive heart disease   . Ischemic cardiomyopathy    a. 10/2014 Echo: EF 40-45%.  Marland Kitchen PVD (peripheral vascular disease) ([place])   . Type II diabetes mellitus ([place])      Social History   Social History  . Marital status: Widowed    Spouse name: N/A  . Number of children: N/A  . Years of education: N/A   Occupational History  . Retired    Social History Main Topics  . Smoking status: Former Smoker    Packs/day: 0.50    Years: 20.00    Types: Cigarettes  . Smokeless tobacco: Never Used     Comment: "quit smoking cigarettes in the 1970's  . Alcohol use No  . Drug use: No  . Sexual activity: Not Currently   Other  Topics Concern  . Not on file   Social History Narrative   Pt lives alone, family nearby.    Past Surgical History:  Procedure Laterality Date  . CARDIAC CATHETERIZATION  2003;   . CARDIAC CATHETERIZATION N/A 11/07/2014   Left Heart Cath; Belva Crome, MD;   . CATARACT EXTRACTION, BILATERAL Bilateral   . CHOLECYSTECTOMY N/A 05/20/2015   Procedure: LAPAROSCOPIC CHOLECYSTECTOMY;  Surgeon: Coralie Keens, MD;  Location: [facility];  Service: General;  Laterality: N/A;  . COLONOSCOPY    . COLONOSCOPY N/A 05/12/2015   Procedure: COLONOSCOPY;  Surgeon: Milus Banister, MD;  Location: [facility];  Service: Endoscopy;  Laterality: N/A;  . [place]   [name] 07/13/2010  . CORONARY ARTERY BYPASS GRAFT  2003   CABG X5  . ESOPHAGOGASTRODUODENOSCOPY  06/10/2011   Procedure: ESOPHAGOGASTRODUODENOSCOPY (EGD);  Surgeon: Ladene Artist, MD,FACG;  Location: [facility] ENDOSCOPY;  Service: Endoscopy;  Laterality: N/A;    Family History  Problem Relation Age of Onset  . Stroke Mother   . Hypertension Mother   . Cancer Mother   . Diabetes      brothers and sisters  . Coronary artery disease    . Hypertension Sister   . Heart attack Sister  No Known Allergies  Current Outpatient Prescriptions on File Prior to Visit  Medication Sig Dispense Refill  . amLODipine (NORVASC) 10 MG tablet TAKE 1 TABLET EVERY DAY 90 tablet 0  . aspirin EC 81 MG tablet Take 81 mg by mouth at bedtime.    Marland Kitchen atorvastatin (LIPITOR) 80 MG tablet Take 1 tablet (80 mg total) by mouth daily.    . carvedilol (COREG) 25 MG tablet TAKE 1 TABLET BY MOUTH TWICE DAILY WITH MEALS  180 tablet 3  . cloNIDine (CATAPRES) 0.1 MG tablet Take 0.1 mg by mouth at bedtime.    . furosemide (LASIX) 40 MG tablet Take one and a half (1.5) tablet (60 mg total) by mouth each morning. Take one (1) tablet (40 mg total) by mouth each evening.    Marland Kitchen glucose blood (ACCU-CHEK AVIVA PLUS) test strip USE TO CHECK BLOOD SUGAR DAILY AND PRN 100 each  12  . isosorbide mononitrate (IMDUR) 60 MG 24 hr tablet Take 1 tablet (60 mg total) by mouth daily. 90 tablet 3  . LANTUS SOLOSTAR 100 UNIT/ML Solostar Pen Inject 20 Units into the skin daily at 10 pm.    . nitroGLYCERIN (NITROSTAT) 0.4 MG SL tablet Place 0.4 mg under the tongue every 5 (five) minutes as needed for chest pain (3 doses MAX).    Marland Kitchen NOVOLOG FLEXPEN 100 UNIT/ML FlexPen INJECT 6 UNITS INTO THE SKIN THREE TIMES DAILY WITH MEALS. PEN EXPIRES 28 DAYS AFTER INITIAL USE 15 pen 3  . pentoxifylline (TRENTAL) 400 MG CR tablet TAKE 1 TABLET TWICE DAILY  AT  10AM  AND  5PM 180 tablet 3  . polyethylene glycol (MIRALAX / GLYCOLAX) packet Take 17 g by mouth daily as needed for mild constipation or moderate constipation. 30 each 1  . Potassium Chloride ER 20 MEQ TBCR Take 20 mEq by mouth daily. 90 tablet 3  . timolol (TIMOPTIC) 0.5 % ophthalmic solution Place 1 drop into both eyes every morning.    . TRAVATAN Z 0.004 % SOLN ophthalmic solution Place 1 drop into both eyes every evening.     No current facility-administered medications on file prior to visit.     BP 120/64 (BP Location: Left Arm, Patient Position: Sitting, Cuff Size: Normal)   Pulse 72   Temp 98.1 F (36.7 C) (Oral)   Resp 20   Ht 5\' 11"  (1.803 m)   Wt 169 lb 4 oz (76.8 kg)   SpO2 98%   BMI 23.61 kg/m      Review of Systems  Constitutional: Negative for appetite change, chills, fatigue and fever.  HENT: Negative for congestion, dental problem, ear pain, hearing loss, sore throat, tinnitus, trouble swallowing and voice change.   Eyes: Negative for pain, discharge and visual disturbance.  Respiratory: Negative for cough, chest tightness, wheezing and stridor.   Cardiovascular: Negative for chest pain, palpitations and leg swelling.  Gastrointestinal: Negative for abdominal distention, abdominal pain, blood in stool, constipation, diarrhea, nausea and vomiting.  Genitourinary: Negative for difficulty urinating, discharge,  flank pain, genital sores, hematuria and urgency.  Musculoskeletal: Negative for arthralgias, back pain, gait problem, joint swelling, myalgias and neck stiffness.  Skin: Negative for rash.  Neurological: Negative for dizziness, syncope, speech difficulty, weakness, numbness and headaches.  Hematological: Negative for adenopathy. Does not bruise/bleed easily.  Psychiatric/Behavioral: Negative for behavioral problems and dysphoric mood. The patient is not nervous/anxious.        Objective:   Physical Exam  Constitutional: He is oriented to person, place,  and time. He appears well-developed.  Blood pressure low normal  HENT:  Head: Normocephalic.  Right Ear: External ear normal.  Left Ear: External ear normal.  Eyes: Conjunctivae and EOM are normal.  Neck: Normal range of motion.  Cardiovascular: Normal rate and normal heart sounds.   Pulmonary/Chest: Breath sounds normal. No respiratory distress. He has no wheezes. He has no rales.  Abdominal: Bowel sounds are normal.  Musculoskeletal: Normal range of motion. He exhibits no edema or tenderness.  Neurological: He is alert and oriented to person, place, and time.  Psychiatric: He has a normal mood and affect. His behavior is normal.          Assessment & Plan:       Diabetes mellitus. Glycemic control appears to be improved.  Will check hemoglobin A1c .  Chronic kidney disease. , hypertension, well-controlled .  Coronary artery disease/, ischemic, neuropathy, stable  .  Continue low-salt diet and aggressive risk factor modification .  Checking though an A1c  yearly eye examination encouraged .  Recheck here 3 months  flu vaccine administered  [name]

## 2016-01-02 ENCOUNTER — Other Ambulatory Visit: Payer: Self-pay | Admitting: Physician Assistant

## 2016-01-02 DIAGNOSIS — E785 Hyperlipidemia, unspecified: Secondary | ICD-10-CM

## 2016-01-02 DIAGNOSIS — I2510 Atherosclerotic heart disease of native coronary artery without angina pectoris: Secondary | ICD-10-CM

## 2016-01-04 ENCOUNTER — Telehealth: Payer: Self-pay | Admitting: Internal Medicine

## 2016-01-04 DIAGNOSIS — E785 Hyperlipidemia, unspecified: Secondary | ICD-10-CM

## 2016-01-04 DIAGNOSIS — I2510 Atherosclerotic heart disease of native coronary artery without angina pectoris: Secondary | ICD-10-CM

## 2016-01-04 MED ORDER — ATORVASTATIN CALCIUM 80 MG PO TABS: 80.0000 mg | ORAL_TABLET | Freq: Every day | ORAL | 0 refills | Status: DC

## 2016-01-04 NOTE — Telephone Encounter (Signed)
Pt need new Rx for atorvastatin   Pharm:  [facility]  Pt need only 6-8 tablets until he receive it from the mail order.  Pt state that the mail order pharmacy will be sending a fax over for the atorvastatin

## 2016-01-04 NOTE — Telephone Encounter (Signed)
Rx for Atorvastatin sent to [facility] as requested and Rx was already sent to mail order.

## 2016-02-10 IMAGING — DX DG CHEST 1V PORT
1 series · 1 of 1 positions shown · non-contrast
Comparison: None.

CLINICAL DATA: Midsternal chest pain began last night.

EXAM:
PORTABLE CHEST - 1 VIEW

[chest ap]
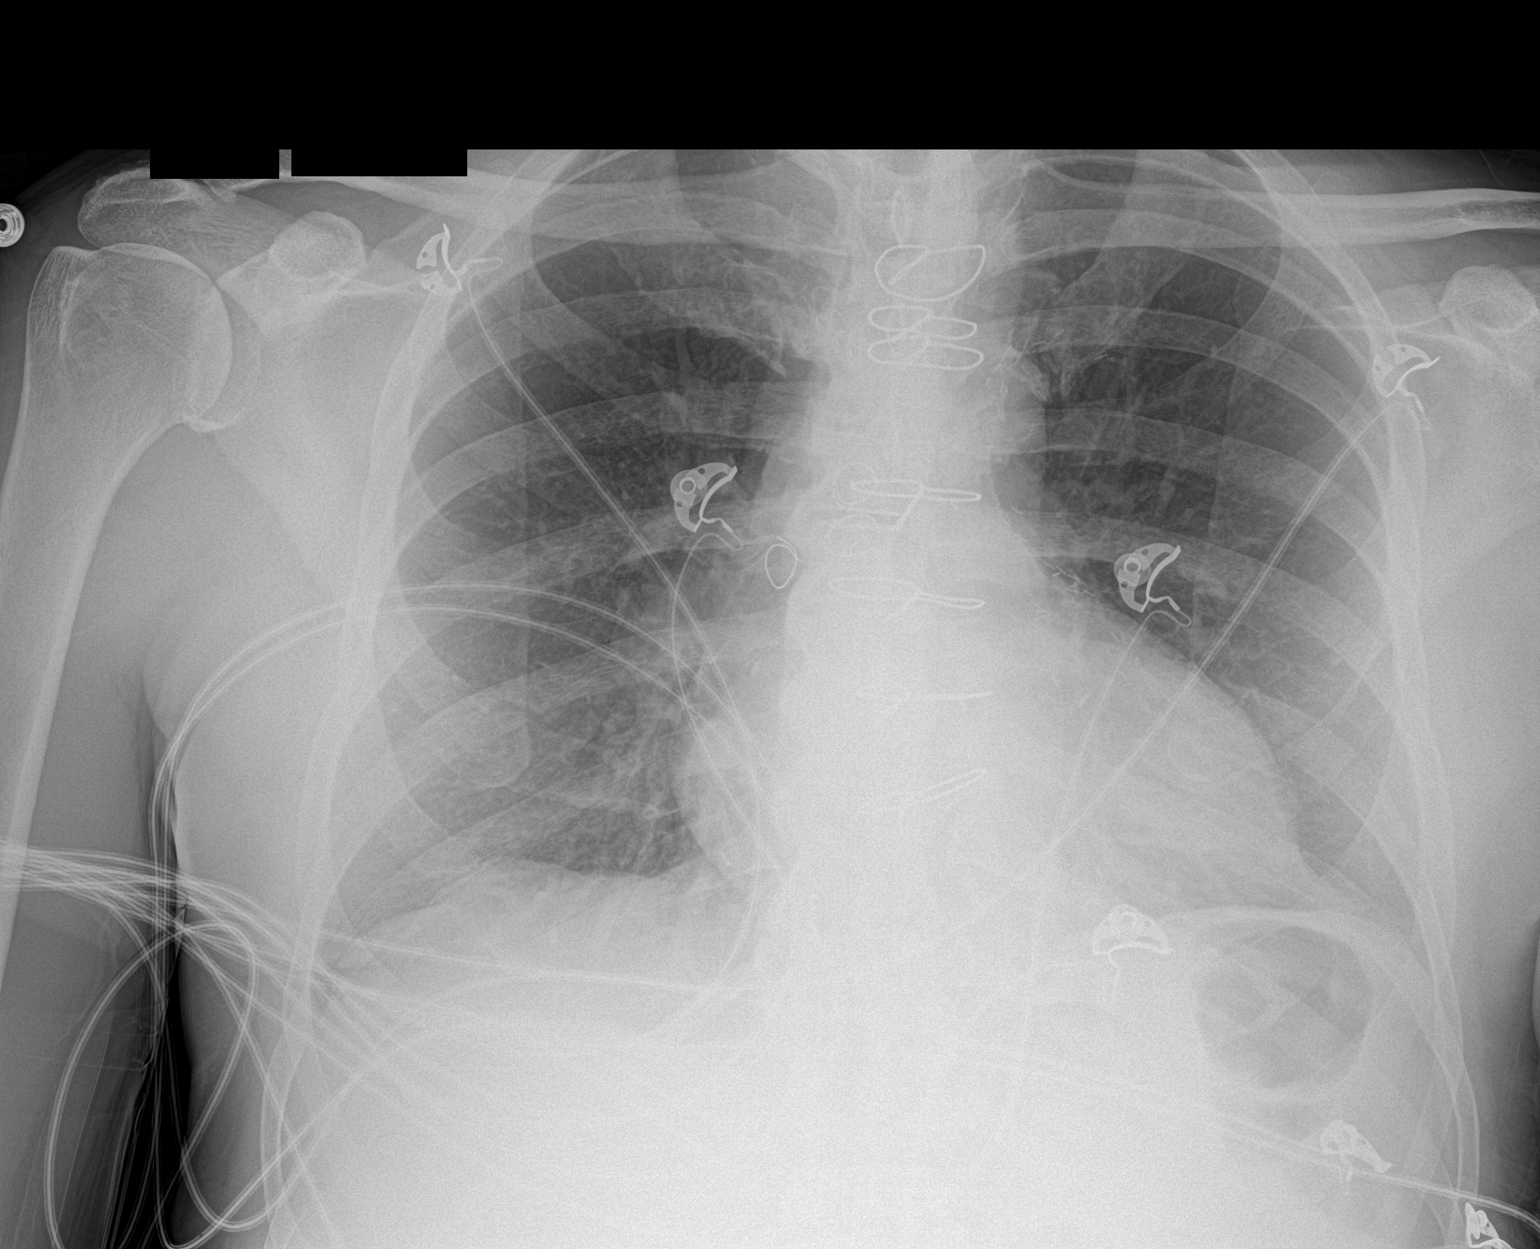

[1 of 1 positions shown; findings below may reference images not displayed]

FINDINGS: There is no focal parenchymal opacity. There is no pleural effusion
or pneumothorax. The heart and mediastinal contours are
unremarkable. There is evidence of prior CABG.

The osseous structures are unremarkable.
IMPRESSION: No active disease.

## 2016-02-23 ENCOUNTER — Other Ambulatory Visit: Payer: Self-pay | Admitting: Interventional Cardiology

## 2016-02-29 DIAGNOSIS — K449 Diaphragmatic hernia without obstruction or gangrene: Secondary | ICD-10-CM

## 2016-02-29 HISTORY — DX: Diaphragmatic hernia without obstruction or gangrene: K44.9

## 2016-03-01 DIAGNOSIS — E113292 Type 2 diabetes mellitus with mild nonproliferative diabetic retinopathy without macular edema, left eye: Secondary | ICD-10-CM | POA: Diagnosis not present

## 2016-03-01 DIAGNOSIS — H401112 Primary open-angle glaucoma, right eye, moderate stage: Secondary | ICD-10-CM | POA: Diagnosis not present

## 2016-03-01 DIAGNOSIS — H401122 Primary open-angle glaucoma, left eye, moderate stage: Secondary | ICD-10-CM | POA: Diagnosis not present

## 2016-03-01 DIAGNOSIS — H52203 Unspecified astigmatism, bilateral: Secondary | ICD-10-CM | POA: Diagnosis not present

## 2016-03-01 DIAGNOSIS — H401132 Primary open-angle glaucoma, bilateral, moderate stage: Secondary | ICD-10-CM | POA: Diagnosis not present

## 2016-03-02 ENCOUNTER — Encounter: Payer: Self-pay | Admitting: Internal Medicine

## 2016-03-02 LAB — HM DIABETES EYE EXAM

## 2016-03-07 DIAGNOSIS — N184 Chronic kidney disease, stage 4 (severe): Secondary | ICD-10-CM | POA: Diagnosis not present

## 2016-03-14 ENCOUNTER — Other Ambulatory Visit: Payer: Self-pay | Admitting: Internal Medicine

## 2016-03-16 ENCOUNTER — Other Ambulatory Visit: Payer: Self-pay | Admitting: Internal Medicine

## 2016-03-28 ENCOUNTER — Ambulatory Visit (INDEPENDENT_AMBULATORY_CARE_PROVIDER_SITE_OTHER): Payer: Medicare HMO | Admitting: Internal Medicine

## 2016-03-28 ENCOUNTER — Encounter: Payer: Self-pay | Admitting: Internal Medicine

## 2016-03-28 VITALS — BP 122/58 | HR 66 | Temp 97.8°F | Ht 71.0 in | Wt 167.0 lb

## 2016-03-28 DIAGNOSIS — E1122 Type 2 diabetes mellitus with diabetic chronic kidney disease: Secondary | ICD-10-CM

## 2016-03-28 DIAGNOSIS — I5042 Chronic combined systolic (congestive) and diastolic (congestive) heart failure: Secondary | ICD-10-CM | POA: Diagnosis not present

## 2016-03-28 DIAGNOSIS — N189 Chronic kidney disease, unspecified: Secondary | ICD-10-CM | POA: Diagnosis not present

## 2016-03-28 DIAGNOSIS — E538 Deficiency of other specified B group vitamins: Secondary | ICD-10-CM

## 2016-03-28 DIAGNOSIS — E0821 Diabetes mellitus due to underlying condition with diabetic nephropathy: Secondary | ICD-10-CM

## 2016-03-28 DIAGNOSIS — Z794 Long term (current) use of insulin: Secondary | ICD-10-CM | POA: Diagnosis not present

## 2016-03-28 DIAGNOSIS — I10 Essential (primary) hypertension: Secondary | ICD-10-CM | POA: Diagnosis not present

## 2016-03-28 DIAGNOSIS — I255 Ischemic cardiomyopathy: Secondary | ICD-10-CM | POA: Diagnosis not present

## 2016-03-28 LAB — BASIC METABOLIC PANEL
BUN: 46 mg/dL — ABNORMAL HIGH (ref 6–23)
CALCIUM: 9.5 mg/dL (ref 8.4–10.5)
CO2: 25 meq/L (ref 19–32)
CREATININE: 3.48 mg/dL — AB (ref 0.40–1.50)
Chloride: 105 mEq/L (ref 96–112)
GFR: 21.78 mL/min — ABNORMAL LOW (ref >60.00)
GLUCOSE: 160 mg/dL — AB (ref 70–99)
Potassium: 4.4 mEq/L (ref 3.5–5.1)
Sodium: 138 mEq/L (ref 135–145)

## 2016-03-28 LAB — VITAMIN B12: Vitamin B-12: 188 pg/mL — ABNORMAL LOW (ref 211–911)

## 2016-03-28 LAB — HEMOGLOBIN A1C: Hgb A1c MFr Bld: 8 % — ABNORMAL HIGH (ref 4.6–6.5)

## 2016-03-28 MED ORDER — INSULIN ASPART 100 UNIT/ML FLEXPEN: 14.0000 [IU] | PEN_INJECTOR | Freq: Three times a day (TID) | SUBCUTANEOUS | 3 refills | Status: DC

## 2016-03-28 MED ORDER — AMLODIPINE BESYLATE 10 MG PO TABS: 10.0000 mg | ORAL_TABLET | Freq: Every day | ORAL | 3 refills | Status: DC

## 2016-03-28 NOTE — Progress Notes (Signed)
Pre visit review using our clinic review tool, if applicable. No additional management support is needed unless otherwise documented below in the visit note. 

## 2016-03-28 NOTE — Patient Instructions (Signed)
Limit your sodium (Salt) intake   Please check your hemoglobin A1c every 3 months  Take a B12 supplement 1 mg by mouth daily    It is important that you exercise regularly, at least 20 minutes 3 to 4 times per week.  If you develop chest pain or shortness of breath seek  medical attention.

## 2016-03-28 NOTE — Progress Notes (Signed)
Subjective:    Patient ID: [name], male    DOB: Jul 14, 1931, 83 y.o.   MRN: [id number]  HPI  83 year old patient who is seen today for follow-up of diabetes. At the present time.  He is on 16 units of Lantus at bedtime and 14 units of  mealtime insulin.  He states fasting blood sugars are generally in the 125-150 range. He was seen in October but a  hemoglobin A1c was not performed at that time  Lab Results  Component Value Date   HGBA1C 8.1 (H) 08/26/2015   He is followed by cardiology with ischemic heart disease He has PAD and chronic kidney disease He has a history of B12 deficiency but apparently has not been taking any IM or oral supplements.  Past Medical History:  Diagnosis Date  . Arthritis    "touch in my fingers" (09/10/2014)  . B12 deficiency anemia    "stopped taking the shots in ~ 06/2014" (09/10/2014)  . CAD (coronary artery disease)    a. H/o MI in 1998;  b. s/p CABG 2003. c. 10/2014 NSTEMI/Cath: LM 75, LAD 144m/d, D1 95, D2 50, LCX 100ost/p, OM1 80, OM2 80, RCA 100p/m, RPDA fills via L->L collats, LIMA->LAD ok, VG->dRCA 100, VG->OM1->OM2 99 prox to OM1 insertion->recanalated ->Tx with heparin,  VG->D1 100-->Med Rx.  . Chronic systolic CHF (congestive heart failure) ([place])    a. EF 40-45% in 10/2014 (previously normal in 08/2014).  . CKD (chronic kidney disease), stage IV ([place])   . Diabetes 1.5, managed as type 1 ([place])   . History of stomach ulcers 2013  . Hyperlipidemia   . Hypertensive heart disease   . Ischemic cardiomyopathy    a. 10/2014 Echo: EF 40-45%.  Marland Kitchen PVD (peripheral vascular disease) ([place])   . Type II diabetes mellitus ([place])      Social History   Social History  . Marital status: Widowed    Spouse name: N/A  . Number of children: N/A  . Years of education: N/A   Occupational History  . Retired    Social History Main Topics  . Smoking status: Former Smoker    Packs/day: 0.50    Years: 20.00    Types: Cigarettes  . Smokeless  tobacco: Never Used     Comment: "quit smoking cigarettes in the 1970's  . Alcohol use No  . Drug use: No  . Sexual activity: Not Currently   Other Topics Concern  . Not on file   Social History Narrative   Pt lives alone, family nearby.    Past Surgical History:  Procedure Laterality Date  . CARDIAC CATHETERIZATION  2003;   . CARDIAC CATHETERIZATION N/A 11/07/2014   Left Heart Cath; Belva Crome, MD;   . CATARACT EXTRACTION, BILATERAL Bilateral   . CHOLECYSTECTOMY N/A 05/20/2015   Procedure: LAPAROSCOPIC CHOLECYSTECTOMY;  Surgeon: Coralie Keens, MD;  Location: [facility];  Service: General;  Laterality: N/A;  . COLONOSCOPY    . COLONOSCOPY N/A 05/12/2015   Procedure: COLONOSCOPY;  Surgeon: Milus Banister, MD;  Location: [facility];  Service: Endoscopy;  Laterality: N/A;  . [place]   [name] 07/13/2010  . CORONARY ARTERY BYPASS GRAFT  2003   CABG X5  . ESOPHAGOGASTRODUODENOSCOPY  06/10/2011   Procedure: ESOPHAGOGASTRODUODENOSCOPY (EGD);  Surgeon: Ladene Artist, MD,FACG;  Location: [facility] ENDOSCOPY;  Service: Endoscopy;  Laterality: N/A;    Family History  Problem Relation Age of Onset  . Stroke Mother   . Hypertension Mother   .  Cancer Mother   . Diabetes      brothers and sisters  . Coronary artery disease    . Hypertension Sister   . Heart attack Sister     No Known Allergies  Current Outpatient Prescriptions on File Prior to Visit  Medication Sig Dispense Refill  . amLODipine (NORVASC) 10 MG tablet TAKE 1 TABLET EVERY DAY 90 tablet 0  . aspirin EC 81 MG tablet Take 81 mg by mouth at bedtime.    Marland Kitchen atorvastatin (LIPITOR) 80 MG tablet Take 1 tablet (80 mg total) by mouth daily at 6 PM. 8 tablet 0  . carvedilol (COREG) 25 MG tablet TAKE 1 TABLET BY MOUTH TWICE DAILY WITH MEALS  180 tablet 3  . cloNIDine (CATAPRES) 0.1 MG tablet TAKE 1 TABLET AT BEDTIME 90 tablet 3  . furosemide (LASIX) 40 MG tablet TAKE 60 MG BY MOUTH IN THE AM & 40 MG BY MOUTH IN THE  PM 225 tablet 3  . glucose blood (ACCU-CHEK AVIVA PLUS) test strip USE TO CHECK BLOOD SUGAR DAILY AND PRN 100 each 12  . isosorbide mononitrate (IMDUR) 60 MG 24 hr tablet Take 1 tablet (60 mg total) by mouth daily. 90 tablet 3  . LANTUS SOLOSTAR 100 UNIT/ML Solostar Pen Inject 16 Units into the skin daily at 10 pm.    . nitroGLYCERIN (NITROSTAT) 0.4 MG SL tablet Place 0.4 mg under the tongue every 5 (five) minutes as needed for chest pain (3 doses MAX).    Marland Kitchen pentoxifylline (TRENTAL) 400 MG CR tablet TAKE 1 TABLET TWICE DAILY  AT  10AM  AND  5PM 180 tablet 3  . polyethylene glycol (MIRALAX / GLYCOLAX) packet Take 17 g by mouth daily as needed for mild constipation or moderate constipation. 30 each 1  . Potassium Chloride ER 20 MEQ TBCR Take 20 mEq by mouth daily. 90 tablet 3  . potassium chloride SA (K-DUR,KLOR-CON) 20 MEQ tablet TAKE 1 TABLET EVERY DAY 90 tablet 3  . timolol (TIMOPTIC) 0.5 % ophthalmic solution Place 1 drop into both eyes every morning.    . TRAVATAN Z 0.004 % SOLN ophthalmic solution Place 1 drop into both eyes every evening.     No current facility-administered medications on file prior to visit.     BP (!) 122/58 (BP Location: Left Arm, Patient Position: Sitting, Cuff Size: Normal)   Pulse 66   Temp 97.8 F (36.6 C) (Oral)   Ht 5\' 11"  (1.803 m)   Wt 167 lb (75.8 kg)   SpO2 98%   BMI 23.29 kg/m     Review of Systems  Constitutional: Negative for appetite change, chills, fatigue and fever.  HENT: Negative for congestion, dental problem, ear pain, hearing loss, sore throat, tinnitus, trouble swallowing and voice change.   Eyes: Negative for pain, discharge and visual disturbance.  Respiratory: Negative for cough, chest tightness, wheezing and stridor.   Cardiovascular: Negative for chest pain, palpitations and leg swelling.  Gastrointestinal: Negative for abdominal distention, abdominal pain, blood in stool, constipation, diarrhea, nausea and vomiting.    Genitourinary: Negative for difficulty urinating, discharge, flank pain, genital sores, hematuria and urgency.  Musculoskeletal: Negative for arthralgias, back pain, gait problem, joint swelling, myalgias and neck stiffness.  Skin: Negative for rash.  Neurological: Negative for dizziness, syncope, speech difficulty, weakness, numbness and headaches.  Hematological: Negative for adenopathy. Does not bruise/bleed easily.  Psychiatric/Behavioral: Negative for behavioral problems and dysphoric mood. The patient is not nervous/anxious.  Objective:   Physical Exam  Constitutional: He is oriented to person, place, and time. He appears well-developed.  Blood pressure 120/60  HENT:  Head: Normocephalic.  Right Ear: External ear normal.  Left Ear: External ear normal.  Eyes: Conjunctivae and EOM are normal.  Neck: Normal range of motion.  Cardiovascular: Normal rate.   Murmur heard. Pedal pulses not easily palpable  Pulmonary/Chest: Breath sounds normal.  Abdominal: Bowel sounds are normal.  Musculoskeletal: Normal range of motion. He exhibits no edema or tenderness.  Neurological: He is alert and oriented to person, place, and time.  Psychiatric: He has a normal mood and affect. His behavior is normal.          Assessment & Plan:   Diabetes mellitus.  Will check hemoglobin A1c.  Increase mealtime insulin to 20 units prior to each meal.  Continue present basal insulin dose Essential hypertension, stable Ischemic cardiomyopathy, stable Chronic kidney disease.  Will review updated renal indices History of B12 deficiency.  We'll check a B12 level  Follow-up 3 months  [name]

## 2016-03-30 DIAGNOSIS — I10 Essential (primary) hypertension: Secondary | ICD-10-CM | POA: Diagnosis not present

## 2016-03-30 DIAGNOSIS — N184 Chronic kidney disease, stage 4 (severe): Secondary | ICD-10-CM | POA: Diagnosis not present

## 2016-03-30 DIAGNOSIS — D649 Anemia, unspecified: Secondary | ICD-10-CM | POA: Diagnosis not present

## 2016-03-30 DIAGNOSIS — N2581 Secondary hyperparathyroidism of renal origin: Secondary | ICD-10-CM | POA: Diagnosis not present

## 2016-03-31 DIAGNOSIS — H26491 Other secondary cataract, right eye: Secondary | ICD-10-CM | POA: Diagnosis not present

## 2016-04-05 ENCOUNTER — Ambulatory Visit (INDEPENDENT_AMBULATORY_CARE_PROVIDER_SITE_OTHER): Payer: Medicare HMO | Admitting: *Deleted

## 2016-04-05 DIAGNOSIS — E538 Deficiency of other specified B group vitamins: Secondary | ICD-10-CM | POA: Diagnosis not present

## 2016-04-05 MED ORDER — CYANOCOBALAMIN 1000 MCG/ML IJ SOLN
1000.0000 ug | Freq: Once | INTRAMUSCULAR | Status: AC
  Administered 2016-04-05: 1000 ug via INTRAMUSCULAR

## 2016-04-12 ENCOUNTER — Ambulatory Visit (INDEPENDENT_AMBULATORY_CARE_PROVIDER_SITE_OTHER): Payer: Medicare HMO

## 2016-04-12 ENCOUNTER — Ambulatory Visit: Payer: Medicare HMO

## 2016-04-12 DIAGNOSIS — E538 Deficiency of other specified B group vitamins: Secondary | ICD-10-CM

## 2016-04-12 MED ORDER — CYANOCOBALAMIN 1000 MCG/ML IJ SOLN
1000.0000 ug | Freq: Once | INTRAMUSCULAR | Status: AC
  Administered 2016-04-12: 1000 ug via INTRAMUSCULAR

## 2016-04-19 ENCOUNTER — Ambulatory Visit (INDEPENDENT_AMBULATORY_CARE_PROVIDER_SITE_OTHER): Payer: Medicare HMO

## 2016-04-19 DIAGNOSIS — E509 Vitamin A deficiency, unspecified: Secondary | ICD-10-CM | POA: Diagnosis not present

## 2016-04-19 MED ORDER — CYANOCOBALAMIN 1000 MCG/ML IJ SOLN
1000.0000 ug | Freq: Once | INTRAMUSCULAR | Status: AC
  Administered 2016-04-19: 1000 ug via INTRAMUSCULAR

## 2016-04-21 NOTE — Progress Notes (Signed)
Cardiology Office Note    Date:  04/22/2016   ID:  FUTURE [name], DOB [birth year], MRN [id number]  PCP:  [name], MD  Cardiologist: [name], MD   Chief Complaint  Patient presents with  . Coronary Artery Disease  . Congestive Heart Failure    History of Present Illness:  [name] is a 83 y.o. male who presents for follow-up of CAD, bypass graft failure with only remaining conduit LIMA to LAD by cath in September 2016, chronic kidney disease stage IV, diabetes mellitus, chronic systolic heart failure with LVEF 40%, peripheral arterial disease, hypertension, and hyperlipidemia. Recent history of stomach ulcers with GI bleeding while on Plavix.  Complaining of noticeable shortness of breath with exertion. He denies chest discomfort. He denies orthopnea. Has not needed to use nitroglycerin. Basically has one source of blood flow to the heart which is the LIMA to the LAD.   Past Medical History:  Diagnosis Date  . Arthritis    "touch in my fingers" (09/10/2014)  . B12 deficiency anemia    "stopped taking the shots in ~ 06/2014" (09/10/2014)  . CAD (coronary artery disease)    a. H/o MI in 1998;  b. s/p CABG 2003. c. 10/2014 NSTEMI/Cath: LM 75, LAD 198m/d, D1 95, D2 50, LCX 100ost/p, OM1 80, OM2 80, RCA 100p/m, RPDA fills via L->L collats, LIMA->LAD ok, VG->dRCA 100, VG->OM1->OM2 99 prox to OM1 insertion->recanalated ->Tx with heparin,  VG->D1 100-->Med Rx.  . Chronic systolic CHF (congestive heart failure) ([place])    a. EF 40-45% in 10/2014 (previously normal in 08/2014).  . CKD (chronic kidney disease), stage IV ([place])   . Diabetes 1.5, managed as type 1 ([place])   . History of stomach ulcers 2013  . Hyperlipidemia   . Hypertensive heart disease   . Ischemic cardiomyopathy    a. 10/2014 Echo: EF 40-45%.  Marland Kitchen PVD (peripheral vascular disease) ([place])   . Type II diabetes mellitus ([place])     Past Surgical History:  Procedure Laterality Date  . CARDIAC  CATHETERIZATION  2003;   . CARDIAC CATHETERIZATION N/A 11/07/2014   Left Heart Cath; Belva Crome, MD;   . CATARACT EXTRACTION, BILATERAL Bilateral   . CHOLECYSTECTOMY N/A 05/20/2015   Procedure: LAPAROSCOPIC CHOLECYSTECTOMY;  Surgeon: Coralie Keens, MD;  Location: [facility];  Service: General;  Laterality: N/A;  . COLONOSCOPY    . COLONOSCOPY N/A 05/12/2015   Procedure: COLONOSCOPY;  Surgeon: Milus Banister, MD;  Location: [facility];  Service: Endoscopy;  Laterality: N/A;  . [place]   [name] 07/13/2010  . CORONARY ARTERY BYPASS GRAFT  2003   CABG X5  . ESOPHAGOGASTRODUODENOSCOPY  06/10/2011   Procedure: ESOPHAGOGASTRODUODENOSCOPY (EGD);  Surgeon: Ladene Artist, MD,FACG;  [facility] END[facility]COPY;  Service: Endoscopy;  Laterality: N/A;    Current Medications: Outpatient Medications Prior to Visit  Medication Sig Dispense Refill  . amLODipine (NORVASC) 10 MG tablet Take 1 tablet (10 mg total) by mouth daily. 90 tablet 3  . aspirin EC 81 MG tablet Take 81 mg by mouth at bedtime.    Marland Kitchen atorvastatin (LIPITOR) 80 MG tablet Take 1 tablet (80 mg total) by mouth daily at 6 PM. 8 tablet 0  . carvedilol (COREG) 25 MG tablet TAKE 1 TABLET BY MOUTH TWICE DAILY WITH MEALS  180 tablet 3  . cloNIDine (CATAPRES) 0.1 MG tablet TAKE 1 TABLET AT BEDTIME 90 tablet 3  . furosemide (LASIX) 40 MG tablet TAKE 60  MG BY MOUTH IN THE AM & 40 MG BY MOUTH IN THE PM 225 tablet 3  . glucose blood (ACCU-CHEK AVIVA PLUS) test strip USE TO CHECK BLOOD SUGAR DAILY AND PRN 100 each 12  . insulin aspart (NOVOLOG FLEXPEN) 100 UNIT/ML FlexPen Inject 14 Units into the skin 3 (three) times daily with meals. 15 pen 3  . isosorbide mononitrate (IMDUR) 60 MG 24 hr tablet Take 1 tablet (60 mg total) by mouth daily. 90 tablet 3  . LANTUS SOLOSTAR 100 UNIT/ML Solostar Pen Inject 16 Units into the skin daily at 10 pm.    . nitroGLYCERIN (NITROSTAT) 0.4 MG SL tablet Place 0.4 mg under the tongue every 5 (five) minutes  as needed for chest pain (3 doses MAX).    Marland Kitchen pentoxifylline (TRENTAL) 400 MG CR tablet TAKE 1 TABLET TWICE DAILY  AT  10AM  AND  5PM 180 tablet 3  . polyethylene glycol (MIRALAX / GLYCOLAX) packet Take 17 g by mouth daily as needed for mild constipation or moderate constipation. 30 each 1  . Potassium Chloride ER 20 MEQ TBCR Take 20 mEq by mouth daily. 90 tablet 3  . potassium chloride SA (K-DUR,KLOR-CON) 20 MEQ tablet TAKE 1 TABLET EVERY DAY 90 tablet 3  . timolol (TIMOPTIC) 0.5 % ophthalmic solution Place 1 drop into both eyes every morning.    . TRAVATAN Z 0.004 % SOLN ophthalmic solution Place 1 drop into both eyes every evening.     No facility-administered medications prior to visit.      Allergies:   Patient has no known allergies.   Social History   Social History  . Marital status: Widowed    Spouse name: N/A  . Number of children: N/A  . Years of education: N/A   Occupational History  . Retired    Social History Main Topics  . Smoking status: Former Smoker    Packs/day: 0.50    Years: 20.00    Types: Cigarettes  . Smokeless tobacco: Never Used     Comment: "quit smoking cigarettes in the 1970's  . Alcohol use No  . Drug use: No  . Sexual activity: Not Currently   Other Topics Concern  . None   Social History Narrative   Pt lives alone, family nearby.     Family History:  The patient's family history includes Cancer in his mother; Heart attack in his sister; Hypertension in his mother and sister; Stroke in his mother.   ROS:   Please see the history of present illness.    Decreased energy and ability to exercise as previously.  All other systems reviewed and are negative.   PHYSICAL EXAM:   VS:  BP 120/70 (BP Location: Left Arm)   Pulse 68   Ht 5\' 11"  (1.803 m)   Wt 165 lb 6.4 oz (75 kg)   BMI 23.07 kg/m    GEN: Well nourished, well developed, in no acute distress  HEENT: normal  Neck: no JVD, carotid bruits, or masses Cardiac: Irregular heart  beat.RRR; no murmurs, rubs, or gallops,no edema  Respiratory:  clear to auscultation bilaterally, normal work of breathing GI: soft, nontender, nondistended, + BS MS: no deformity or atrophy  Skin: warm and dry, no rash Neuro:  Alert and Oriented x 3, Strength and sensation are intact Psych: euthymic mood, full affect  Wt Readings from Last 3 Encounters:  04/22/16 165 lb 6.4 oz (75 kg)  03/28/16 167 lb (75.8 kg)  12/28/15 169 lb 4 oz (  76.8 kg)      Studies/Labs Reviewed:   EKG:  EKG  Normal sinus rhythm, prominent voltage, first-degree AV block, frequent PVCs on telemetry. Left axis deviation, anterior reduction and R waves with T-wave inversion.  Recent Labs: 05/18/2015: Magnesium 1.7 05/25/2015: B Natriuretic Peptide 1,089.1; Hemoglobin 9.1; Platelets 311 06/19/2015: ALT 12 03/28/2016: BUN 46; Creatinine, Ser 3.48; Potassium 4.4; Sodium 138   Lipid Panel    Component Value Date/Time   CHOL 81 05/17/2015 2329   TRIG 111 05/17/2015 2329   HDL 22 (L) 05/17/2015 2329   CHOLHDL 3.7 05/17/2015 2329   VLDL 22 05/17/2015 2329   LDLCALC 37 05/17/2015 2329   LDLDIRECT 93.3 01/22/2013 0854    Additional studies/ records that were reviewed today include:  Echocardiogram September 2016: ------------------------------------------------------------------- LV EF: 40% -   45%  ------------------------------------------------------------------- Indications:      Chest pain 786.51.  ------------------------------------------------------------------- History:   PMH:   Coronary artery disease.  PMH:   Myocardial infarction.  Risk factors:  Hypertension. Dyslipidemia.  ------------------------------------------------------------------- Study Conclusions  - Left ventricle: The cavity size was normal. Wall thickness was   normal. Systolic function was mildly to moderately reduced. The   estimated ejection fraction was in the range of 40% to 45%.   Akinesis of the inferior  myocardium. - Mitral valve: There was mild regurgitation. - Pulmonary arteries: Systolic pressure was moderately increased.   PA peak pressure: 46 mm Hg (S).   ASSESSMENT:    1. Cardiomyopathy, ischemic   2. Atherosclerosis of autologous vein coronary artery bypass graft with stable angina pectoris ([place])   3. Essential hypertension   4. Systolic and diastolic CHF, chronic ([place])   5. Sinus bradycardia   6. SOB (shortness of breath)   7. Chronic kidney disease, stage IV (severe) ([facility])      PLAN:  In order of problems listed above:  1. With complaint of increasing dyspnea on exertion, we should reevaluate LV systolic function with echocardiography. 2. Denies angina. No nitroglycerin use. 3. Adequate blood pressure control. 4. We assess LV function as mentioned above. 5. Sinus bradycardia is not a significant issue at this time. 6. Shortness of breath could be related to worsening kidney function with increasing volume. That does not appear to be the case as his weight today is 165 pounds, very similar to prior. As noted below, kidney function is been stable with creatinine in the 3.4 range. 7. Stable function based on most recent blood work performed in January, less than a month ago, 3.48 creatinine  Overall plan at this time his reassessment of LV function by echocardiography with clinical follow-up in 6 months. Further adjustments may become necessary if LV systolic function is decreasing. This could include consideration of AICD.  Medication Adjustments/Labs and Tests Ordered: Current medicines are reviewed at length with the patient today.  Concerns regarding medicines are outlined above.  Medication changes, Labs and Tests ordered today are listed in the Patient Instructions below. Patient Instructions  Medication Instructions:  None  Labwork: None  Testing/Procedures: Your physician has requested that you have an echocardiogram. Echocardiography is a painless test that  uses sound waves to create images of your heart. It provides your doctor with information about the size and shape of your heart and how well your heart's chambers and valves are working. This procedure takes approximately one hour. There are no restrictions for this procedure.   Follow-Up: Your physician wants you to follow-up in: 6-9 months with Dr. Tamala Julian.  You  will receive a reminder letter in the mail two months in advance. If you don't receive a letter, please call our office to schedule the follow-up appointment.   Any Other Special Instructions Will Be Listed Below (If Applicable).     If you need a refill on your cardiac medications before your next appointment, please call your pharmacy.      Signed, [name], MD  04/22/2016 10:58 AM    [facility] [address], [place], [place]  [address] Phone: [phone]; Fax: [phone]

## 2016-04-22 ENCOUNTER — Encounter: Payer: Self-pay | Admitting: Interventional Cardiology

## 2016-04-22 ENCOUNTER — Ambulatory Visit (INDEPENDENT_AMBULATORY_CARE_PROVIDER_SITE_OTHER): Payer: Medicare HMO | Admitting: Interventional Cardiology

## 2016-04-22 ENCOUNTER — Encounter (INDEPENDENT_AMBULATORY_CARE_PROVIDER_SITE_OTHER): Payer: Self-pay

## 2016-04-22 VITALS — BP 120/70 | HR 68 | Ht 71.0 in | Wt 165.4 lb

## 2016-04-22 DIAGNOSIS — I10 Essential (primary) hypertension: Secondary | ICD-10-CM

## 2016-04-22 DIAGNOSIS — N184 Chronic kidney disease, stage 4 (severe): Secondary | ICD-10-CM | POA: Diagnosis not present

## 2016-04-22 DIAGNOSIS — I5042 Chronic combined systolic (congestive) and diastolic (congestive) heart failure: Secondary | ICD-10-CM

## 2016-04-22 DIAGNOSIS — I255 Ischemic cardiomyopathy: Secondary | ICD-10-CM | POA: Diagnosis not present

## 2016-04-22 DIAGNOSIS — R0602 Shortness of breath: Secondary | ICD-10-CM

## 2016-04-22 DIAGNOSIS — R001 Bradycardia, unspecified: Secondary | ICD-10-CM

## 2016-04-22 DIAGNOSIS — I25718 Atherosclerosis of autologous vein coronary artery bypass graft(s) with other forms of angina pectoris: Secondary | ICD-10-CM | POA: Diagnosis not present

## 2016-04-22 NOTE — Patient Instructions (Signed)
Medication Instructions:  None  Labwork: None  Testing/Procedures: Your physician has requested that you have an echocardiogram. Echocardiography is a painless test that uses sound waves to create images of your heart. It provides your doctor with information about the size and shape of your heart and how well your heart's chambers and valves are working. This procedure takes approximately one hour. There are no restrictions for this procedure.   Follow-Up: Your physician wants you to follow-up in: 6-9 months with Dr. Tamala Julian.  You will receive a reminder letter in the mail two months in advance. If you don't receive a letter, please call our office to schedule the follow-up appointment.   Any Other Special Instructions Will Be Listed Below (If Applicable).     If you need a refill on your cardiac medications before your next appointment, please call your pharmacy.

## 2016-04-26 ENCOUNTER — Ambulatory Visit (INDEPENDENT_AMBULATORY_CARE_PROVIDER_SITE_OTHER): Payer: Medicare HMO

## 2016-04-26 DIAGNOSIS — E538 Deficiency of other specified B group vitamins: Secondary | ICD-10-CM

## 2016-04-26 MED ORDER — CYANOCOBALAMIN 1000 MCG/ML IJ SOLN
1000.0000 ug | Freq: Once | INTRAMUSCULAR | Status: AC
  Administered 2016-04-26: 1000 ug via INTRAMUSCULAR

## 2016-04-26 NOTE — Progress Notes (Signed)
Patient had his B12 injection for his Vit B12 deficiency on 04/26/2016 at 2pm. Given by Rosealee Albee CMA.

## 2016-05-02 ENCOUNTER — Other Ambulatory Visit: Payer: Self-pay | Admitting: Internal Medicine

## 2016-05-09 ENCOUNTER — Ambulatory Visit (HOSPITAL_COMMUNITY): Payer: Medicare HMO | Attending: Cardiology

## 2016-05-09 ENCOUNTER — Other Ambulatory Visit: Payer: Self-pay

## 2016-05-09 DIAGNOSIS — I2510 Atherosclerotic heart disease of native coronary artery without angina pectoris: Secondary | ICD-10-CM | POA: Insufficient documentation

## 2016-05-09 DIAGNOSIS — E785 Hyperlipidemia, unspecified: Secondary | ICD-10-CM | POA: Diagnosis not present

## 2016-05-09 DIAGNOSIS — I082 Rheumatic disorders of both aortic and tricuspid valves: Secondary | ICD-10-CM | POA: Diagnosis not present

## 2016-05-09 DIAGNOSIS — I10 Essential (primary) hypertension: Secondary | ICD-10-CM | POA: Insufficient documentation

## 2016-05-09 DIAGNOSIS — E119 Type 2 diabetes mellitus without complications: Secondary | ICD-10-CM | POA: Insufficient documentation

## 2016-05-09 DIAGNOSIS — R0602 Shortness of breath: Secondary | ICD-10-CM | POA: Diagnosis not present

## 2016-05-09 DIAGNOSIS — I252 Old myocardial infarction: Secondary | ICD-10-CM | POA: Insufficient documentation

## 2016-05-09 DIAGNOSIS — Z951 Presence of aortocoronary bypass graft: Secondary | ICD-10-CM | POA: Diagnosis not present

## 2016-05-11 ENCOUNTER — Telehealth: Payer: Self-pay | Admitting: *Deleted

## 2016-05-11 MED ORDER — HYDRALAZINE HCL 25 MG PO TABS: 25.0000 mg | ORAL_TABLET | Freq: Three times a day (TID) | ORAL | 3 refills | Status: DC

## 2016-05-11 NOTE — Telephone Encounter (Signed)
-----   Message from Belva Crome, MD sent at 05/09/2016  8:43 PM EDT ----- Let the patient know heart is weaker. Add hydralazine 25 mg TID.Incrase Imdur to 120 mg daily 2 weeks later if feeling okay. F/U with me in 1 month. A copy will be sent to [name], MD

## 2016-05-11 NOTE — Telephone Encounter (Signed)
Spoke with pt and went over echo results.  Pt agreeable to plan and verbalized understanding.  Sent Hydralazine to preferred pharmacy.  Advised pt to call me in 2 weeks with how he is feeling so we can decide on whether to increase Imdur.  Pt agreeable.

## 2016-05-16 ENCOUNTER — Other Ambulatory Visit: Payer: Self-pay | Admitting: Internal Medicine

## 2016-05-23 ENCOUNTER — Other Ambulatory Visit (INDEPENDENT_AMBULATORY_CARE_PROVIDER_SITE_OTHER): Payer: Medicare HMO

## 2016-05-23 DIAGNOSIS — E538 Deficiency of other specified B group vitamins: Secondary | ICD-10-CM | POA: Diagnosis not present

## 2016-05-23 MED ORDER — CYANOCOBALAMIN 1000 MCG/ML IJ SOLN
1000.0000 ug | Freq: Once | INTRAMUSCULAR | Status: AC
  Administered 2016-05-23: 1000 ug via INTRAMUSCULAR

## 2016-05-26 MED ORDER — ISOSORBIDE MONONITRATE ER 60 MG PO TB24: 120.0000 mg | ORAL_TABLET | Freq: Every day | ORAL | 3 refills | Status: DC

## 2016-05-26 NOTE — Telephone Encounter (Signed)
Spoke with pt and he states that he has been feeling great.  Denies dizziness, lightheadedness, HA, SOB or CP.  BP has been around 124/71.  Advised pt to increase Imdur to 120mg  once daily.  Advised pt about sx he should call with with the increase in Imdur.  Also advised to continue monitoring BP and bring readings with him to appt with Dr. Tamala Julian on 06/13/16.

## 2016-05-26 NOTE — Addendum Note (Signed)
Addended by: Loren Racer on: 05/26/2016 09:56 AM   Modules accepted: Orders

## 2016-05-27 ENCOUNTER — Encounter: Payer: Self-pay | Admitting: *Deleted

## 2016-06-01 ENCOUNTER — Other Ambulatory Visit: Payer: Self-pay | Admitting: Internal Medicine

## 2016-06-12 NOTE — Progress Notes (Signed)
Cardiology Office Note    Date:  06/13/2016   ID:  [name], DOB [birth year], MRN [id number]  PCP:  [name], MD  Cardiologist: [name], MD   Chief Complaint  Patient presents with  . Coronary Artery Disease    History of Present Illness:  [name] is a 83 y.o. male who presents for follow-up ofCAD, bypass graft failure with only remaining conduit LIMA to LAD by cath in September 2016, chronic kidney disease stage IV, diabetes mellitus, chronic systolic heart failure with LVEF 40%, peripheral arterial disease, hypertension, hyperlipidemia, h/o stomach ulcers with GI bleeding while on Plavix.  Doing okay. Denies angina. Says hydralazine is not done him much good, in other words he does not feel any different since starting it. He has not had syncope, lightheadedness, or dizziness. There is no orthopnea or PND.  Past Medical History:  Diagnosis Date  . Arthritis    "touch in my fingers" (09/10/2014)  . B12 deficiency anemia    "stopped taking the shots in ~ 06/2014" (09/10/2014)  . CAD (coronary artery disease)    a. H/o MI in 1998;  b. s/p CABG 2003. c. 10/2014 NSTEMI/Cath: LM 75, LAD 144m/d, D1 95, D2 50, LCX 100ost/p, OM1 80, OM2 80, RCA 100p/m, RPDA fills via L->L collats, LIMA->LAD ok, VG->dRCA 100, VG->OM1->OM2 99 prox to OM1 insertion->recanalated ->Tx with heparin,  VG->D1 100-->Med Rx.  . Chronic systolic CHF (congestive heart failure) ([place])    a. EF 40-45% in 10/2014 (previously normal in 08/2014).  . CKD (chronic kidney disease), stage IV ([place])   . Diabetes 1.5, managed as type 1 ([place])   . History of stomach ulcers 2013  . Hyperlipidemia   . Hypertensive heart disease   . Ischemic cardiomyopathy    a. 10/2014 Echo: EF 40-45%.  Marland Kitchen PVD (peripheral vascular disease) ([place])   . Type II diabetes mellitus ([place])     Past Surgical History:  Procedure Laterality Date  . CARDIAC CATHETERIZATION  2003;   . CARDIAC CATHETERIZATION N/A  11/07/2014   Left Heart Cath; Belva Crome, MD;   . CATARACT EXTRACTION, BILATERAL Bilateral   . CHOLECYSTECTOMY N/A 05/20/2015   Procedure: LAPAROSCOPIC CHOLECYSTECTOMY;  Surgeon: Coralie Keens, MD;  Location: [facility];  Service: General;  Laterality: N/A;  . COLONOSCOPY    . COLONOSCOPY N/A 05/12/2015   Procedure: COLONOSCOPY;  Surgeon: Milus Banister, MD;  Location: [facility];  Service: Endoscopy;  Laterality: N/A;  . [place]   [name] 07/13/2010  . CORONARY ARTERY BYPASS GRAFT  2003   CABG X5  . ESOPHAGOGASTRODUODENOSCOPY  06/10/2011   Procedure: ESOPHAGOGASTRODUODENOSCOPY (EGD);  Surgeon: Ladene Artist, MD,FACG;  Location: [facility] ENDOSCOPY;  Service: Endoscopy;  Laterality: N/A;    Current Medications: Outpatient Medications Prior to Visit  Medication Sig Dispense Refill  . amLODipine (NORVASC) 10 MG tablet Take 1 tablet (10 mg total) by mouth daily. 90 tablet 3  . aspirin EC 81 MG tablet Take 81 mg by mouth at bedtime.    Marland Kitchen atorvastatin (LIPITOR) 80 MG tablet Take 1 tablet (80 mg total) by mouth daily at 6 PM. 8 tablet 0  . carvedilol (COREG) 25 MG tablet TAKE 1 TABLET BY MOUTH TWICE DAILY WITH MEALS  180 tablet 3  . cloNIDine (CATAPRES) 0.1 MG tablet TAKE 1 TABLET AT BEDTIME 90 tablet 3  . furosemide (LASIX) 40 MG tablet TAKE 60 MG BY MOUTH IN THE AM & 40 MG BY  MOUTH IN THE PM 225 tablet 3  . glucose blood (ACCU-CHEK AVIVA PLUS) test strip USE TO CHECK BLOOD SUGAR DAILY AND PRN 100 each 12  . hydrALAZINE (APRESOLINE) 25 MG tablet Take 1 tablet (25 mg total) by mouth 3 (three) times daily. 270 tablet 3  . isosorbide mononitrate (IMDUR) 60 MG 24 hr tablet Take 2 tablets (120 mg total) by mouth daily. 180 tablet 3  . LANTUS SOLOSTAR 100 UNIT/ML Solostar Pen INJECT 14 UNITS INTO THE SKIN DAILY AT 10 PM. 15 mL 3  . NOVOLOG FLEXPEN 100 UNIT/ML FlexPen INJECT 14 UNITS SUBCUTANEOUSLY 3 (THREE) TIMES DAILY WITH MEALS. 45 mL 3  . pentoxifylline (TRENTAL) 400 MG CR tablet  TAKE 1 TABLET TWICE DAILY  AT  10AM  AND  5PM 180 tablet 3  . polyethylene glycol (MIRALAX / GLYCOLAX) packet Take 17 g by mouth daily as needed for mild constipation or moderate constipation. 30 each 1  . Potassium Chloride ER 20 MEQ TBCR Take 20 mEq by mouth daily. 90 tablet 3  . potassium chloride SA (K-DUR,KLOR-CON) 20 MEQ tablet TAKE 1 TABLET EVERY DAY 90 tablet 3  . timolol (TIMOPTIC) 0.5 % ophthalmic solution Place 1 drop into both eyes every morning.    . TRAVATAN Z 0.004 % SOLN ophthalmic solution Place 1 drop into both eyes every evening.    . nitroGLYCERIN (NITROSTAT) 0.4 MG SL tablet Place 0.4 mg under the tongue every 5 (five) minutes as needed for chest pain (3 doses MAX).     No facility-administered medications prior to visit.      Allergies:   Patient has no known allergies.   Social History   Social History  . Marital status: Widowed    Spouse name: N/A  . Number of children: N/A  . Years of education: N/A   Occupational History  . Retired    Social History Main Topics  . Smoking status: Former Smoker    Packs/day: 0.50    Years: 20.00    Types: Cigarettes  . Smokeless tobacco: Never Used     Comment: "quit smoking cigarettes in the 1970's  . Alcohol use No  . Drug use: No  . Sexual activity: Not Currently   Other Topics Concern  . None   Social History Narrative   Pt lives alone, family nearby.     Family History:  The patient's family history includes Cancer in his mother; Heart attack in his sister; Hypertension in his mother and sister; Stroke in his mother.  ROS:   Please see the history of present illness.    Shortness of breath on exertion is still somewhat limiting.  All other systems reviewed and are negative.   PHYSICAL EXAM:   VS:  BP 140/66 (BP Location: Left Arm)   Pulse 73   Ht 5\' 11"  (1.803 m)   Wt 169 lb 12.8 oz (77 kg)   BMI 23.68 kg/m    GEN: Well nourished, well developed, in no acute distress  HEENT: normal  Neck: no  JVD, carotid bruits, or masses Cardiac: RRR; no murmurs, rubs, or gallops,no edema  Respiratory:  clear to auscultation bilaterally, normal work of breathing GI: soft, nontender, nondistended, + BS MS: no deformity or atrophy  Skin: warm and dry, no rash Neuro:  Alert and Oriented x 3, Strength and sensation are intact Psych: euthymic mood, full affect  Wt Readings from Last 3 Encounters:  06/13/16 169 lb 12.8 oz (77 kg)  04/22/16 165 lb 6.4  oz (75 kg)  03/28/16 167 lb (75.8 kg)      Studies/Labs Reviewed:   EKG:  EKG  none  Recent Labs: 06/19/2015: ALT 12 03/28/2016: BUN 46; Creatinine, Ser 3.48; Potassium 4.4; Sodium 138   Lipid Panel    Component Value Date/Time   CHOL 81 05/17/2015 2329   TRIG 111 05/17/2015 2329   HDL 22 (L) 05/17/2015 2329   CHOLHDL 3.7 05/17/2015 2329   VLDL 22 05/17/2015 2329   LDLCALC 37 05/17/2015 2329   LDLDIRECT 93.3 01/22/2013 0854    Additional studies/ records that were reviewed today include:  none    ASSESSMENT:    1. Coronary artery disease involving 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 ([place])   2. Essential hypertension   3. Systolic and diastolic CHF, chronic ([place])   4. Diabetes mellitus due to underlying condition with diabetic nephropathy, with long-term current use of insulin ([place])   5. Peripheral vascular disease ([place])      PLAN:  In order of problems listed above:  1. Anginal grade is stable. Rare nitroglycerin use. Call if increasing requirement for nitroglycerin. 2. Excellent control on the current medical regimen. 3. Now on long-acting nitrates and hydralazine to help preserve LV function. Not a candidate for ACE/ARB/Entresto due to end-stage kidney disease. 4. Not addressed 5. Not addressed    Medication Adjustments/Labs and Tests Ordered: Current medicines are reviewed at length with the patient today.  Concerns regarding medicines are outlined above.  Medication changes, Labs and Tests  ordered today are listed in the Patient Instructions below. Patient Instructions  Medication Instructions:  None  Labwork: None  Testing/Procedures: None  Follow-Up: Your physician wants you to follow-up in: 6-7 months with Dr. Tamala Julian.  You will receive a reminder letter in the mail two months in advance. If you don't receive a letter, please call our office to schedule the follow-up appointment.   Any Other Special Instructions Will Be Listed Below (If Applicable).     If you need a refill on your cardiac medications before your next appointment, please call your pharmacy.      Signed, [name], MD  06/13/2016 1:50 PM    [facility] [place], [place], [place]  [address] Phone: [phone]; Fax: [phone]

## 2016-06-13 ENCOUNTER — Encounter (INDEPENDENT_AMBULATORY_CARE_PROVIDER_SITE_OTHER): Payer: Self-pay

## 2016-06-13 ENCOUNTER — Encounter: Payer: Self-pay | Admitting: Interventional Cardiology

## 2016-06-13 ENCOUNTER — Ambulatory Visit (INDEPENDENT_AMBULATORY_CARE_PROVIDER_SITE_OTHER): Payer: Medicare HMO | Admitting: Interventional Cardiology

## 2016-06-13 VITALS — BP 140/66 | HR 73 | Ht 71.0 in | Wt 169.8 lb

## 2016-06-13 DIAGNOSIS — I25709 Atherosclerosis of coronary artery bypass graft(s), unspecified, with unspecified angina pectoris: Secondary | ICD-10-CM

## 2016-06-13 DIAGNOSIS — Z794 Long term (current) use of insulin: Secondary | ICD-10-CM | POA: Diagnosis not present

## 2016-06-13 DIAGNOSIS — I10 Essential (primary) hypertension: Secondary | ICD-10-CM | POA: Diagnosis not present

## 2016-06-13 DIAGNOSIS — I739 Peripheral vascular disease, unspecified: Secondary | ICD-10-CM

## 2016-06-13 DIAGNOSIS — I5042 Chronic combined systolic (congestive) and diastolic (congestive) heart failure: Secondary | ICD-10-CM | POA: Diagnosis not present

## 2016-06-13 DIAGNOSIS — E0821 Diabetes mellitus due to underlying condition with diabetic nephropathy: Secondary | ICD-10-CM

## 2016-06-13 MED ORDER — NITROGLYCERIN 0.4 MG SL SUBL: 0.4000 mg | SUBLINGUAL_TABLET | SUBLINGUAL | 3 refills | Status: DC | PRN

## 2016-06-13 NOTE — Patient Instructions (Signed)
Medication Instructions:  None  Labwork: None  Testing/Procedures: None  Follow-Up: Your physician wants you to follow-up in: 6-7 months with Dr. Tamala Julian.  You will receive a reminder letter in the mail two months in advance. If you don't receive a letter, please call our office to schedule the follow-up appointment.   Any Other Special Instructions Will Be Listed Below (If Applicable).     If you need a refill on your cardiac medications before your next appointment, please call your pharmacy.

## 2016-06-24 ENCOUNTER — Ambulatory Visit (INDEPENDENT_AMBULATORY_CARE_PROVIDER_SITE_OTHER): Payer: Medicare HMO | Admitting: *Deleted

## 2016-06-24 DIAGNOSIS — E538 Deficiency of other specified B group vitamins: Secondary | ICD-10-CM | POA: Diagnosis not present

## 2016-06-24 MED ORDER — CYANOCOBALAMIN 1000 MCG/ML IJ SOLN
1000.0000 ug | Freq: Once | INTRAMUSCULAR | Status: AC
Start: 2016-06-24 — End: 2016-06-24
  Administered 2016-06-24: 1000 ug via INTRAMUSCULAR

## 2016-06-24 NOTE — Progress Notes (Signed)
Patient here for B12 injection; administered IM to to left deltoid; patient tolerated well; no s/s of reactions

## 2016-06-27 ENCOUNTER — Encounter: Payer: Self-pay | Admitting: Internal Medicine

## 2016-06-27 ENCOUNTER — Ambulatory Visit (INDEPENDENT_AMBULATORY_CARE_PROVIDER_SITE_OTHER): Payer: Medicare HMO | Admitting: Internal Medicine

## 2016-06-27 VITALS — BP 124/62 | HR 67 | Temp 98.0°F | Ht 71.0 in | Wt 167.2 lb

## 2016-06-27 DIAGNOSIS — I255 Ischemic cardiomyopathy: Secondary | ICD-10-CM | POA: Diagnosis not present

## 2016-06-27 DIAGNOSIS — I10 Essential (primary) hypertension: Secondary | ICD-10-CM

## 2016-06-27 DIAGNOSIS — Z794 Long term (current) use of insulin: Secondary | ICD-10-CM

## 2016-06-27 DIAGNOSIS — E119 Type 2 diabetes mellitus without complications: Secondary | ICD-10-CM | POA: Diagnosis not present

## 2016-06-27 DIAGNOSIS — E0821 Diabetes mellitus due to underlying condition with diabetic nephropathy: Secondary | ICD-10-CM | POA: Diagnosis not present

## 2016-06-27 DIAGNOSIS — I25709 Atherosclerosis of coronary artery bypass graft(s), unspecified, with unspecified angina pectoris: Secondary | ICD-10-CM | POA: Diagnosis not present

## 2016-06-27 DIAGNOSIS — IMO0001 Reserved for inherently not codable concepts without codable children: Secondary | ICD-10-CM

## 2016-06-27 LAB — HEMOGLOBIN A1C: HEMOGLOBIN A1C: 7.2 % — AB (ref 4.6–6.5)

## 2016-06-27 MED ORDER — INSULIN GLARGINE 100 UNIT/ML SOLOSTAR PEN: 16.0000 [IU] | PEN_INJECTOR | Freq: Every day | SUBCUTANEOUS | 3 refills | Status: DC

## 2016-06-27 MED ORDER — INSULIN ASPART 100 UNIT/ML FLEXPEN: 16.0000 [IU] | PEN_INJECTOR | Freq: Three times a day (TID) | SUBCUTANEOUS | 3 refills | Status: DC

## 2016-06-27 NOTE — Progress Notes (Signed)
Pre visit review using our clinic review tool, if applicable. No additional management support is needed unless otherwise documented below in the visit note. 

## 2016-06-27 NOTE — Progress Notes (Signed)
Subjective:    Patient ID: [name], male    DOB: 1931/03/15, 83 y.o.   MRN: [id number]  HPI  Lab Results  Component Value Date   HGBA1C 8.0 (H) 03/28/2016   83 year old patient who is seen today for his quarterly follow-up.  He has insulin requiring diabetes. He has coronary artery disease with systolic heart failure.  He has been seen by cardiology recently.  He has end-stage renal disease and is scheduled for nephrology follow-up in one week. Doing fairly well.  He has noticed some slight increase in shortness of breath with exertion over the past week.  No weight gain or peripheral edema.  No chest pain He does have a history of B12 deficiency.  Compliance with monthly B12 shots .  Discussed.  He states fasting blood sugars generally are in the 1:30 range.  He states his highest fasting blood sugar 150 He states that he is using 16 units of basal insulin and 16 units of mealtime insulin 3 times daily.  Denies any hypoglycemia  Past Medical History:  Diagnosis Date  . Arthritis    "touch in my fingers" (09/10/2014)  . B12 deficiency anemia    "stopped taking the shots in ~ 06/2014" (09/10/2014)  . CAD (coronary artery disease)    a. H/o MI in 1998;  b. s/p CABG 2003. c. 10/2014 NSTEMI/Cath: LM 75, LAD 162m/d, D1 95, D2 50, LCX 100ost/p, OM1 80, OM2 80, RCA 100p/m, RPDA fills via L->L collats, LIMA->LAD ok, VG->dRCA 100, VG->OM1->OM2 99 prox to OM1 insertion->recanalated ->Tx with heparin,  VG->D1 100-->Med Rx.  . Chronic systolic CHF (congestive heart failure) ([place])    a. EF 40-45% in 10/2014 (previously normal in 08/2014).  . CKD (chronic kidney disease), stage IV ([place])   . Diabetes 1.5, managed as type 1 ([place])   . History of stomach ulcers 2013  . Hyperlipidemia   . Hypertensive heart disease   . Ischemic cardiomyopathy    a. 10/2014 Echo: EF 40-45%.  Marland Kitchen PVD (peripheral vascular disease) ([place])   . Type II diabetes mellitus ([place])      Social History   Social History    . Marital status: Widowed    Spouse name: N/A  . Number of children: N/A  . Years of education: N/A   Occupational History  . Retired    Social History Main Topics  . Smoking status: Former Smoker    Packs/day: 0.50    Years: 20.00    Types: Cigarettes  . Smokeless tobacco: Never Used     Comment: "quit smoking cigarettes in the 1970's  . Alcohol use No  . Drug use: No  . Sexual activity: Not Currently   Other Topics Concern  . Not on file   Social History Narrative   Pt lives alone, family nearby.    Past Surgical History:  Procedure Laterality Date  . CARDIAC CATHETERIZATION  2003;   . CARDIAC CATHETERIZATION N/A 11/07/2014   Left Heart Cath; Belva Crome, MD;   . CATARACT EXTRACTION, BILATERAL Bilateral   . CHOLECYSTECTOMY N/A 05/20/2015   Procedure: LAPAROSCOPIC CHOLECYSTECTOMY;  Surgeon: Coralie Keens, MD;  Location: [facility];  Service: General;  Laterality: N/A;  . COLONOSCOPY    . COLONOSCOPY N/A 05/12/2015   Procedure: COLONOSCOPY;  Surgeon: Milus Banister, MD;  Location: [facility];  Service: Endoscopy;  Laterality: N/A;  . [place]   [name] 07/13/2010  . CORONARY ARTERY BYPASS GRAFT  2003  CABG X5  . ESOPHAGOGASTRODUODENOSCOPY  06/10/2011   Procedure: ESOPHAGOGASTRODUODENOSCOPY (EGD);  Surgeon: Ladene Artist, MD,FACG;  Location: [facility];  Service: Endoscopy;  Laterality: N/A;    Family History  Problem Relation Age of Onset  . Stroke Mother   . Hypertension Mother   . Cancer Mother   . Diabetes      brothers and sisters  . Coronary artery disease    . Hypertension Sister   . Heart attack Sister     No Known Allergies  Current Outpatient Prescriptions on File Prior to Visit  Medication Sig Dispense Refill  . amLODipine (NORVASC) 10 MG tablet Take 1 tablet (10 mg total) by mouth daily. 90 tablet 3  . aspirin EC 81 MG tablet Take 81 mg by mouth at bedtime.    Marland Kitchen atorvastatin (LIPITOR) 80 MG tablet Take 1 tablet (80 mg  total) by mouth daily at 6 PM. 8 tablet 0  . carvedilol (COREG) 25 MG tablet TAKE 1 TABLET BY MOUTH TWICE DAILY WITH MEALS  180 tablet 3  . cloNIDine (CATAPRES) 0.1 MG tablet TAKE 1 TABLET AT BEDTIME 90 tablet 3  . furosemide (LASIX) 40 MG tablet TAKE 60 MG BY MOUTH IN THE AM & 40 MG BY MOUTH IN THE PM 225 tablet 3  . glucose blood (ACCU-CHEK AVIVA PLUS) test strip USE TO CHECK BLOOD SUGAR DAILY AND PRN 100 each 12  . hydrALAZINE (APRESOLINE) 25 MG tablet Take 1 tablet (25 mg total) by mouth 3 (three) times daily. 270 tablet 3  . isosorbide mononitrate (IMDUR) 60 MG 24 hr tablet Take 2 tablets (120 mg total) by mouth daily. 180 tablet 3  . LANTUS SOLOSTAR 100 UNIT/ML Solostar Pen INJECT 14 UNITS INTO THE SKIN DAILY AT 10 PM. 15 mL 3  . nitroGLYCERIN (NITROSTAT) 0.4 MG SL tablet Place 1 tablet (0.4 mg total) under the tongue every 5 (five) minutes as needed for chest pain (3 doses MAX). 25 tablet 3  . NOVOLOG FLEXPEN 100 UNIT/ML FlexPen INJECT 14 UNITS SUBCUTANEOUSLY 3 (THREE) TIMES DAILY WITH MEALS. 45 mL 3  . pentoxifylline (TRENTAL) 400 MG CR tablet TAKE 1 TABLET TWICE DAILY  AT  10AM  AND  5PM 180 tablet 3  . polyethylene glycol (MIRALAX / GLYCOLAX) packet Take 17 g by mouth daily as needed for mild constipation or moderate constipation. 30 each 1  . Potassium Chloride ER 20 MEQ TBCR Take 20 mEq by mouth daily. 90 tablet 3  . potassium chloride SA (K-DUR,KLOR-CON) 20 MEQ tablet TAKE 1 TABLET EVERY DAY 90 tablet 3  . timolol (TIMOPTIC) 0.5 % ophthalmic solution Place 1 drop into both eyes every morning.    . TRAVATAN Z 0.004 % SOLN ophthalmic solution Place 1 drop into both eyes every evening.     No current facility-administered medications on file prior to visit.     BP 124/62 (BP Location: Left Arm, Patient Position: Sitting, Cuff Size: Normal)   Pulse 67   Temp 98 F (36.7 C) (Oral)   Ht 5\' 11"  (1.803 m)   Wt 167 lb 3.2 oz (75.8 kg)   SpO2 98%   BMI 23.32 kg/m    Review of  Systems  Constitutional: Negative for appetite change, chills, fatigue and fever.  HENT: Negative for congestion, dental problem, ear pain, hearing loss, sore throat, tinnitus, trouble swallowing and voice change.   Eyes: Negative for pain, discharge and visual disturbance.  Respiratory: Positive for shortness of breath. Negative for cough, chest  tightness, wheezing and stridor.   Cardiovascular: Negative for chest pain, palpitations and leg swelling.  Gastrointestinal: Negative for abdominal distention, abdominal pain, blood in stool, constipation, diarrhea, nausea and vomiting.  Genitourinary: Negative for difficulty urinating, discharge, flank pain, genital sores, hematuria and urgency.  Musculoskeletal: Negative for arthralgias, back pain, gait problem, joint swelling, myalgias and neck stiffness.  Skin: Negative for rash.  Neurological: Negative for dizziness, syncope, speech difficulty, weakness, numbness and headaches.  Hematological: Negative for adenopathy. Does not bruise/bleed easily.  Psychiatric/Behavioral: Negative for behavioral problems and dysphoric mood. The patient is not nervous/anxious.        Objective:   Physical Exam  Constitutional: He is oriented to person, place, and time. He appears well-developed.  HENT:  Head: Normocephalic.  Right Ear: External ear normal.  Left Ear: External ear normal.  Eyes: Conjunctivae and EOM are normal.  Neck: Normal range of motion.  Cardiovascular: Normal rate and normal heart sounds.   Pulmonary/Chest: He has rales.  A few scattered basilar crackles  Abdominal: Bowel sounds are normal.  Musculoskeletal: Normal range of motion. He exhibits no edema or tenderness.  Neurological: He is alert and oriented to person, place, and time.  Psychiatric: He has a normal mood and affect. His behavior is normal.          Assessment & Plan:   Diabetes mellitus.  Will check hemoglobin A1c.  Goal hemoglobin A1c between 7 and  8 End-stage renal disease.  Follow-up nephrology next week as scheduled Systolic heart failure.  Appears fairly euvolemic.  Will increase furosemide to 60 mg twice a day for 3 days and observe. B12 deficiency.  We'll continue monthly B12 injections  [name]

## 2016-06-27 NOTE — Patient Instructions (Addendum)
WE NOW OFFER   [facility] FAST TRACK!!!  SAME DAY Appointments for ACUTE CARE  Such as: Sprains, Injuries, cuts, abrasions, rashes, muscle pain, joint pain, back pain Colds, flu, sore throats, headache, allergies, cough, fever  Ear pain, sinus and eye infections Abdominal pain, nausea, vomiting, diarrhea, upset stomach Animal/insect bites  3 Easy Ways to Schedule: Walk-In Scheduling Call in scheduling Mychart Sign-up: [email or web address]   Increase furosemide to 80 mg twice daily for 3 days Call if any clinical worsening of shortness of breath  Monthly B12 injection   Please check your hemoglobin A1c every 3 months  Limit your sodium (Salt) intake  Cardiology follow-up as scheduled  Follow-up with kidney medicine as  scheduled

## 2016-07-18 DIAGNOSIS — N2581 Secondary hyperparathyroidism of renal origin: Secondary | ICD-10-CM | POA: Diagnosis not present

## 2016-07-18 DIAGNOSIS — D631 Anemia in chronic kidney disease: Secondary | ICD-10-CM | POA: Diagnosis not present

## 2016-07-18 DIAGNOSIS — N184 Chronic kidney disease, stage 4 (severe): Secondary | ICD-10-CM | POA: Diagnosis not present

## 2016-07-18 LAB — BASIC METABOLIC PANEL
BUN: 50 mg/dL — AB (ref 4–21)
Creatinine: 3.7 mg/dL — AB (ref 0.6–1.3)
GLUCOSE: 156 mg/dL
Potassium: 4.3 mmol/L (ref 3.4–5.3)
Sodium: 139 mmol/L (ref 137–147)

## 2016-07-18 LAB — CBC AND DIFFERENTIAL: HEMOGLOBIN: 9.9 g/dL — AB (ref 13.5–17.5)

## 2016-07-20 ENCOUNTER — Ambulatory Visit (INDEPENDENT_AMBULATORY_CARE_PROVIDER_SITE_OTHER): Payer: Medicare HMO

## 2016-07-20 VITALS — BP 136/60 | HR 61 | Ht 69.0 in | Wt 164.0 lb

## 2016-07-20 DIAGNOSIS — Z0000 Encounter for general adult medical examination without abnormal findings: Secondary | ICD-10-CM

## 2016-07-20 NOTE — Progress Notes (Addendum)
Subjective:   [name] is a 83 y.o. male who presents for Medicare Annual/Subsequent preventive examination.  The Patient was informed that the wellness visit is to identify future health risk and educate and initiate measures that can reduce risk for increased disease through the lifespan.    NO ROS; Medicare Wellness Visit  Psychosocial Dtr died at 67 aneurysm Lost his wife 8 months later- had breathing issues He was in his 61's.  2 boys  remain, one in [place] and one in [place] Retired 83 yo as he wanted to keep working  Worked at [employer] in a family of 17; father remarried;  born in [place] 3 sisters and one brother left from his family; he is the youngest   Social worker health as good, fair or great? 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 BP at home 110/60; BS < 140    Smoking history- Former smoker; 10pack hx;  Quit date unknown but was over 11 yo Also educated regarding AAA for male tobacco users with smoking hx  Completed AAA check in 2003 and was neg   Smokeless tobacco   ETOH - no  Medication adherence or issues? No issues Is taking K tablets as ordered as well as lasix   RISK FACTORS Diet   Diabetes A1c 8.1  Weight has been the same Breakfast- cereal and bacon and eggs Lunch sandwich Dinner; hamburger; apples; lettuce; cucumbers Mustard greens    Regular exercise  Likes to go to [employer] x 2 times a week Declined going more than 2 days a week  Goes to the [employer] good until he go to 57's; states he has been more tired since  [place] around a lot during the day as he know  walking is good for him  Cardiac Risk Factors:  CABG 2003- 17yo  Angioplasty 1998 Advanced aged > 67 in men;  Hyperlipidemia -  HDL 22; LDL low; trig 111 Diabetes - A1c 7.2 Family History - Stroke; HTN; Cancer; Sister HTN, MI Obesity neg  Fall risk no falls  Given education on "Fall Prevention in the Home" for more safety tips the patient can apply as appropriate.  Long term goal is to "age in place"   Mobility of Functional changes this year? no Safety; one level home Has a niece; one son calls every day  Has asked him to move but not interested at this time Good neighbors - ok wears sunscreen - not really in the sun safe place for firearms; keep in safe in safe place if they exist Motor vehicle accidents; no  Mental Health:  Any emotional problems? Anxious, depressed, irritable, sad or blue? no Denies feeling depressed or hopeless; voices pleasure in daily life How many social activities have you been engaged in within the last 2 weeks? no   Hearing Screening Comments: Hearing good No hearing screen  Vision Screening Comments: Dr. Ellie Lunch  Every year;  Next July or august  Eye exam submitted 02/2016 no Diabetic retinopathy  Activities of Daily Living - See functional screen  Cognitive testing; Ad8 score; 0 or less than 2  MMSE deferred or completed if AD8 + 2 issues  Advanced Directives completed   Patient Care Team: Marletta Lor, MD as PCP - General   Immunization History  Administered Date(s) Administered  . Influenza Split 01/07/2011, 11/26/2012  . Influenza Whole 02/28/2005, 12/07/2007, 12/19/2008, 11/27/2009  . Influenza, High Dose Seasonal PF 12/28/2015  . Influenza,inj,Quad PF,36+ Mos 11/13/2013, 11/09/2014  . Pneumococcal Conjugate-13 01/28/2013  . Pneumococcal Polysaccharide-23 02/28/2005  . Tdap 01/13/2012   Required Immunizations needed today  Screening test up to date or reviewed for plan of completion Health Maintenance Due  Topic  Date Due  . URINE MICROALBUMIN  03/29/2015   Is trying Rev[employer]; a foot massage that is a circulation booster  Ordered online and hoping this will help his foot pain;   Cardiac Risk Factors include: advanced age (>11men, >82 women);diabetes mellitus;dyslipidemia;family history of premature cardiovascular disease;hypertension;male gender;sedentary lifestyle     Objective:    Vitals: BP 136/60   Pulse 61   Ht 5\' 9"  (1.753 m)   Wt 164 lb (74.4 kg)   SpO2 97%   BMI 24.22 kg/m   Body mass index is 24.22 kg/m.  Tobacco History  Smoking Status  . Former Smoker  . Packs/day: 0.50  . Years: 20.00  . Types: Cigarettes  Smokeless Tobacco  . Never Used    Comment: "quit smoking cigarettes in the 1960s      Counseling given: Yes   Past Medical History:  Diagnosis Date  . Arthritis    "touch in my fingers" (09/10/2014)  . B12 deficiency anemia    "stopped taking the shots in ~ 06/2014" (09/10/2014)  . CAD (coronary artery disease)    a. H/o MI in 1998;  b. s/p CABG 2003. c. 10/2014 NSTEMI/Cath: LM 75, LAD 1107m/d, D1 95, D2 50, LCX 100ost/p, OM1 80, OM2 80, RCA 100p/m, RPDA fills via L->L collats, LIMA->LAD ok, VG->dRCA 100, VG->OM1->OM2 99 prox to OM1 insertion->recanalated ->Tx with heparin,  VG->D1 100-->Med Rx.  . Chronic systolic CHF (congestive heart failure) ([place])    a. EF 40-45% in 10/2014 (previously normal in 08/2014).  . CKD (chronic kidney disease), stage IV ([place])   . Diabetes 1.5, managed as type 1 ([place])   . History of stomach ulcers 2013  . Hyperlipidemia   . Hypertensive heart disease   . Ischemic cardiomyopathy    a. 10/2014 Echo: EF 40-45%.  Marland Kitchen PVD (peripheral vascular disease) ([place])   . Type II diabetes mellitus ([place])    Past Surgical History:  Procedure Laterality Date  . CARDIAC CATHETERIZATION  2003;   . CARDIAC CATHETERIZATION N/A 11/07/2014   Left Heart Cath; Belva Crome, MD;   . CATARACT EXTRACTION, BILATERAL Bilateral   . CHOLECYSTECTOMY N/A 05/20/2015    Procedure: LAPAROSCOPIC CHOLECYSTECTOMY;  Surgeon: Coralie Keens, MD;  Location: [facility];  Service: General;  Laterality: N/A;  . COLONOSCOPY    . COLONOSCOPY N/A 05/12/2015   Procedure: COLONOSCOPY;  Surgeon: Milus Banister, MD;  Location: [facility];  Service: Endoscopy;  Laterality: N/A;  . [place]   [name] 07/13/2010  . CORONARY ARTERY BYPASS GRAFT  2003   CABG X5  . ESOPHAGOGASTRODUODENOSCOPY  06/10/2011   Procedure: ESOPHAGOGASTRODUODENOSCOPY (EGD);  Surgeon: Ladene Artist, MD,FACG;  Location: [facility];  Service: Endoscopy;  Laterality: N/A;   Family History  Problem Relation Age of Onset  . Stroke Mother   . Hypertension Mother   .  Cancer Mother   . Diabetes Unknown        brothers and sisters  . Coronary artery disease Unknown   . Hypertension Sister   . Heart attack Sister    History  Sexual Activity  . Sexual activity: Not Currently    Outpatient Encounter Prescriptions as of 07/20/2016  Medication Sig  . amLODipine (NORVASC) 10 MG tablet Take 1 tablet (10 mg total) by mouth daily.  Marland Kitchen aspirin EC 81 MG tablet Take 81 mg by mouth at bedtime.  Marland Kitchen atorvastatin (LIPITOR) 80 MG tablet Take 1 tablet (80 mg total) by mouth daily at 6 PM.  . carvedilol (COREG) 25 MG tablet TAKE 1 TABLET BY MOUTH TWICE DAILY WITH MEALS   . cloNIDine (CATAPRES) 0.1 MG tablet TAKE 1 TABLET AT BEDTIME  . furosemide (LASIX) 40 MG tablet TAKE 60 MG BY MOUTH IN THE AM & 40 MG BY MOUTH IN THE PM  . glucose blood (ACCU-CHEK AVIVA PLUS) test strip USE TO CHECK BLOOD SUGAR DAILY AND PRN  . hydrALAZINE (APRESOLINE) 25 MG tablet Take 1 tablet (25 mg total) by mouth 3 (three) times daily.  . insulin aspart (NOVOLOG FLEXPEN) 100 UNIT/ML FlexPen Inject 16 Units into the skin 3 (three) times daily with meals.  . Insulin Glargine (LANTUS SOLOSTAR) 100 UNIT/ML Solostar Pen Inject 16 Units into the skin daily at 10 pm.  . isosorbide mononitrate (IMDUR) 60 MG 24 hr tablet Take 2 tablets  (120 mg total) by mouth daily.  . nitroGLYCERIN (NITROSTAT) 0.4 MG SL tablet Place 1 tablet (0.4 mg total) under the tongue every 5 (five) minutes as needed for chest pain (3 doses MAX).  Marland Kitchen pentoxifylline (TRENTAL) 400 MG CR tablet TAKE 1 TABLET TWICE DAILY  AT  10AM  AND  5PM  . polyethylene glycol (MIRALAX / GLYCOLAX) packet Take 17 g by mouth daily as needed for mild constipation or moderate constipation.  . Potassium Chloride ER 20 MEQ TBCR Take 20 mEq by mouth daily.  . timolol (TIMOPTIC) 0.5 % ophthalmic solution Place 1 drop into both eyes every morning.  . TRAVATAN Z 0.004 % SOLN ophthalmic solution Place 1 drop into both eyes every evening.  . potassium chloride SA (K-DUR,KLOR-CON) 20 MEQ tablet TAKE 1 TABLET EVERY DAY   No facility-administered encounter medications on file as of 07/20/2016.     Activities of Daily Living In your present state of health, do you have any difficulty performing the following activities: 07/20/2016  Hearing? N  Vision? N  Difficulty concentrating or making decisions? N  Walking or climbing stairs? N  Dressing or bathing? N  Doing errands, shopping? N  Preparing Food and eating ? N  Using the Toilet? N  In the past six months, have you accidently leaked urine? N  Do you have problems with loss of bowel control? N  Managing your Medications? N  Managing your Finances? N  Housekeeping or managing your Housekeeping? N  Some recent data might be hidden    Patient Care Team: Marletta Lor, MD as PCP - General   Assessment:     Exercise Activities and Dietary recommendations Current Exercise Habits: Home exercise routine;Structured exercise class, Time (Minutes): 45, Frequency (Times/Week): 2, Weekly Exercise (Minutes/Week): 90, Intensity: Moderate  Goals    . patient          [facility]       Fall Risk Fall Risk  07/20/2016 04/22/2015 02/03/2015 12/16/2014 03/28/2014  Falls in  the past year? No No Yes No No  Number falls in  past yr: - - 1 - -  Injury with Fall? - - No - -  Risk for fall due to : - - Other (Comment) Medication side effect -  Risk for fall due to (comments): - - Accidentially fell - -  Follow up - - Falls prevention discussed - -   Depression Screen PHQ 2/9 Scores 07/20/2016 04/22/2015 02/03/2015 12/16/2014  PHQ - 2 Score 0 0 0 0    Cognitive Function/ no issues;  Lives alone; answers are detailed when asked;  Understood his medicine and health care needs. Appears tired; states he felt good until his 5s       Immunization History  Administered Date(s) Administered  . Influenza Split 01/07/2011, 11/26/2012  . Influenza Whole 02/28/2005, 12/07/2007, 12/19/2008, 11/27/2009  . Influenza, High Dose Seasonal PF 12/28/2015  . Influenza,inj,Quad PF,36+ Mos 11/13/2013, 11/09/2014  . Pneumococcal Conjugate-13 01/28/2013  . Pneumococcal Polysaccharide-23 02/28/2005  . Tdap 01/13/2012   Screening Tests Health Maintenance  Topic Date Due  . URINE MICROALBUMIN  03/29/2015  . INFLUENZA VACCINE  09/28/2016  . FOOT EXAM  12/27/2016  . HEMOGLOBIN A1C  12/27/2016  . OPHTHALMOLOGY EXAM  03/02/2017  . TETANUS/TDAP  01/12/2022  . PNA vac Low Risk Adult  Completed      Plan:      PCP Notes  Health Maintenance Declines microalbumin; defer labs to his kidney doctor    Abnormal Screens none   Referrals no  Patient concerns;  Nurse Concerns; No issues but appears tired; going to exercise 2 days a week. States he is "fine"  Son's live [place] and [place] Extended family close by; son calls daily Plans age in place; son has asked him to come to [place]. Doesn't want to go at this time    Next PCP apt 07/30     I have personally reviewed and noted the following in the patient's chart:   . Medical and social history . Use of alcohol, tobacco or illicit drugs  . Current medications and supplements . Functional ability and status . Nutritional status . Physical activity . Advanced  directives . List of other physicians . Hospitalizations, surgeries, and ER visits in previous 12 months . Vitals . Screenings to include cognitive, depression, and falls . Referrals and appointments  In addition, I have reviewed and discussed with patient certain preventive protocols, quality metrics, and best practice recommendations. A written personalized care plan for preventive services as well as general preventive health recommendations were provided to patient.     Wynetta Fines, RN  07/20/2016  Results of Medicare wellness visit reviewed and agree with findings  [name]

## 2016-07-20 NOTE — Patient Instructions (Addendum)
[name] , Thank you for taking time to come for your Medicare Wellness Visit. I appreciate your ongoing commitment to your health goals. Please review the following plan we discussed and let me know if I can assist you in the future.   Shingrix is a vaccine for the prevention of Shingles in Adults 50 and older.  If you are on Medicare, you can request a prescription from your doctor to be filled at a pharmacy.  Please check with your benefits regarding applicable copays or out of pocket expenses.  The Shingrix is given in 2 vaccines approx 8 weeks apart. You must receive the 2nd dose prior to 6 months from receipt of the first.      These are the goals we discussed: Goals    . patient          [facility]        This is a list of the screening recommended for you and due dates:  Health Maintenance  Topic Date Due  . Urine Protein Check  03/29/2015  . Flu Shot  09/28/2016  . Complete foot exam   12/27/2016  . Hemoglobin A1C  12/27/2016  . Eye exam for diabetics  03/02/2017  . Tetanus Vaccine  01/12/2022  . Pneumonia vaccines  Completed     Health Maintenance, Male A healthy lifestyle and preventive care is important for your health and wellness. Ask your health care provider about what schedule of regular examinations is right for you. What should I know about weight and diet?  Eat a Healthy Diet  Eat plenty of vegetables, fruits, whole grains, low-fat dairy products, and lean protein.  Do not eat a lot of foods high in solid fats, added sugars, or salt. Maintain a Healthy Weight  Regular exercise can help you achieve or maintain a healthy weight. You should:  Do at least 150 minutes of exercise each week. The exercise should increase your heart rate and make you sweat (moderate-intensity exercise).  Do strength-training exercises at least twice a week. Watch Your Levels of Cholesterol and Blood Lipids  Have your blood tested for lipids and cholesterol  every 5 years starting at 81 years of age. If you are at high risk for heart disease, you should start having your blood tested when you are 81 years old. You may need to have your cholesterol levels checked more often if:  Your lipid or cholesterol levels are high.  You are older than 81 years of age.  You are at high risk for heart disease. What should I know about cancer screening? Many types of cancers can be detected early and may often be prevented. Lung Cancer  You should be screened every year for lung cancer if:  You are a current smoker who has smoked for at least 30 years.  You are a former smoker who has quit within the past 15 years.  Talk to your health care provider about your screening options, when you should start screening, and how often you should be screened. Colorectal Cancer  Routine colorectal cancer screening usually begins at 81 years of age and should be repeated every 5-10 years until you are 81 years old. You may need to be screened more often if early forms of precancerous polyps or small growths are found. Your health care provider may recommend screening at an earlier age if you have risk factors for colon cancer.  Your health care provider may recommend using home test  kits to check for hidden blood in the stool.  A small camera at the end of a tube can be used to examine your colon (sigmoidoscopy or colonoscopy). This checks for the earliest forms of colorectal cancer. Prostate and Testicular Cancer  Depending on your age and overall health, your health care provider may do certain tests to screen for prostate and testicular cancer.  Talk to your health care provider about any symptoms or concerns you have about testicular or prostate cancer. Skin Cancer  Check your skin from head to toe regularly.  Tell your health care provider about any new moles or changes in moles, especially if:  There is a change in a mole's size, shape, or color.  You  have a mole that is larger than a pencil eraser.  Always use sunscreen. Apply sunscreen liberally and repeat throughout the day.  Protect yourself by wearing long sleeves, pants, a wide-brimmed hat, and sunglasses when outside. What should I know about heart disease, diabetes, and high blood pressure?  If you are 20-50 years of age, have your blood pressure checked every 3-5 years. If you are 35 years of age or older, have your blood pressure checked every year. You should have your blood pressure measured twice-once when you are at a hospital or clinic, and once when you are not at a hospital or clinic. Record the average of the two measurements. To check your blood pressure when you are not at a hospital or clinic, you can use:  An automated blood pressure machine at a pharmacy.  A home blood pressure monitor.  Talk to your health care provider about your target blood pressure.  If you are between 5-23 years old, ask your health care provider if you should take aspirin to prevent heart disease.  Have regular diabetes screenings by checking your fasting blood sugar level.  If you are at a normal weight and have a low risk for diabetes, have this test once every three years after the age of 80.  If you are overweight and have a high risk for diabetes, consider being tested at a younger age or more often.  A one-time screening for abdominal aortic aneurysm (AAA) by ultrasound is recommended for men aged 64-75 years who are current or former smokers. What should I know about preventing infection? Hepatitis B  If you have a higher risk for hepatitis B, you should be screened for this virus. Talk with your health care provider to find out if you are at risk for hepatitis B infection. Hepatitis C  Blood testing is recommended for:  Everyone born from [birth year] through 1963.  Anyone with known risk factors for hepatitis C. Sexually Transmitted Diseases (STDs)  You should be screened each  year for STDs including gonorrhea and chlamydia if:  You are sexually active and are younger than 81 years of age.  You are older than 81 years of age and your health care provider tells you that you are at risk for this type of infection.  Your sexual activity has changed since you were last screened and you are at an increased risk for chlamydia or gonorrhea. Ask your health care provider if you are at risk.  Talk with your health care provider about whether you are at high risk of being infected with HIV. Your health care provider may recommend a prescription medicine to help prevent HIV infection. What else can I do?  Schedule regular health, dental, and eye exams.  Stay current with  your vaccines (immunizations).  Do not use any tobacco products, such as cigarettes, chewing tobacco, and e-cigarettes. If you need help quitting, ask your health care provider.  Limit alcohol intake to no more than 2 drinks per day. One drink equals 12 ounces of beer, 5 ounces of wine, or 1 ounces of hard liquor.  Do not use street drugs.  Do not share needles.  Ask your health care provider for help if you need support or information about quitting drugs.  Tell your health care provider if you often feel depressed.  Tell your health care provider if you have ever been abused or do not feel safe at home. This information is not intended to replace advice given to you by your health care provider. Make sure you discuss any questions you have with your health care provider. Document Released: 08/13/2007 Document Revised: 10/14/2015 Document Reviewed: 11/18/2014 Elsevier Interactive Patient Education  2017 [place] Prevention in the Home Falls can cause injuries and can affect people from all age groups. There are many simple things that you can do to make your home safe and to help prevent falls. What can I do on the outside of my home?  Regularly repair the edges of walkways and  driveways and fix any cracks.  Remove high doorway thresholds.  Trim any shrubbery on the main path into your home.  Use bright outdoor lighting.  Clear walkways of debris and clutter, including tools and rocks.  Regularly check that handrails are securely fastened and in good repair. Both sides of any steps should have handrails.  Install guardrails along the edges of any raised decks or porches.  Have leaves, snow, and ice cleared regularly.  Use sand or salt on walkways during winter months.  In the garage, clean up any spills right away, including grease or oil spills. What can I do in the bathroom?  Use night lights.  Install grab bars by the toilet and in the tub and shower. Do not use towel bars as grab bars.  Use non-skid mats or decals on the floor of the tub or shower.  If you need to sit down while you are in the shower, use a plastic, non-slip stool.  Keep the floor dry. Immediately clean up any water that spills on the floor.  Remove soap buildup in the tub or shower on a regular basis.  Attach bath mats securely with double-sided non-slip rug tape.  Remove throw rugs and other tripping hazards from the floor. What can I do in the bedroom?  Use night lights.  Make sure that a bedside light is easy to reach.  Do not use oversized bedding that drapes onto the floor.  Have a firm chair that has side arms to use for getting dressed.  Remove throw rugs and other tripping hazards from the floor. What can I do in the kitchen?  Clean up any spills right away.  Avoid walking on wet floors.  Place frequently used items in easy-to-reach places.  If you need to reach for something above you, use a sturdy step stool that has a grab bar.  Keep electrical cables out of the way.  Do not use floor polish or wax that makes floors slippery. If you have to use wax, make sure that it is non-skid floor wax.  Remove throw rugs and other tripping hazards from the  floor. What can I do in the stairways?  Do not leave any items on the stairs.  Make sure that there are handrails on both sides of the stairs. Fix handrails that are broken or loose. Make sure that handrails are as long as the stairways.  Check any carpeting to make sure that it is firmly attached to the stairs. Fix any carpet that is loose or worn.  Avoid having throw rugs at the top or bottom of stairways, or secure the rugs with carpet tape to prevent them from moving.  Make sure that you have a light switch at the top of the stairs and the bottom of the stairs. If you do not have them, have them installed. What are some other fall prevention tips?  Wear closed-toe shoes that fit well and support your feet. Wear shoes that have rubber soles or low heels.  When you use a stepladder, make sure that it is completely opened and that the sides are firmly locked. Have someone hold the ladder while you are using it. Do not climb a closed stepladder.  Add color or contrast paint or tape to grab bars and handrails in your home. Place contrasting color strips on the first and last steps.  Use mobility aids as needed, such as canes, walkers, scooters, and crutches.  Turn on lights if it is dark. Replace any light bulbs that burn out.  Set up furniture so that there are clear paths. Keep the furniture in the same spot.  Fix any uneven floor surfaces.  Choose a carpet design that does not hide the edge of steps of a stairway.  Be aware of any and all pets.  Review your medicines with your healthcare provider. Some medicines can cause dizziness or changes in blood pressure, which increase your risk of falling. Talk with your health care provider about other ways that you can decrease your risk of falls. This may include working with a physical therapist or trainer to improve your strength, balance, and endurance. This information is not intended to replace advice given to you by your health  care provider. Make sure you discuss any questions you have with your health care provider. Document Released: 02/04/2002 Document Revised: 07/14/2015 Document Reviewed: 03/21/2014 Elsevier Interactive Patient Education  2017 [employer].

## 2016-07-27 ENCOUNTER — Ambulatory Visit (INDEPENDENT_AMBULATORY_CARE_PROVIDER_SITE_OTHER): Payer: Medicare HMO

## 2016-07-27 DIAGNOSIS — E538 Deficiency of other specified B group vitamins: Secondary | ICD-10-CM

## 2016-07-27 MED ORDER — CYANOCOBALAMIN 1000 MCG/ML IJ SOLN
1000.0000 ug | Freq: Once | INTRAMUSCULAR | Status: AC
  Administered 2016-07-27: 1000 ug via INTRAMUSCULAR

## 2016-07-29 ENCOUNTER — Telehealth: Payer: Self-pay

## 2016-07-29 DIAGNOSIS — D508 Other iron deficiency anemias: Secondary | ICD-10-CM

## 2016-07-29 DIAGNOSIS — D649 Anemia, unspecified: Secondary | ICD-10-CM | POA: Diagnosis not present

## 2016-07-29 DIAGNOSIS — I10 Essential (primary) hypertension: Secondary | ICD-10-CM | POA: Diagnosis not present

## 2016-07-29 DIAGNOSIS — N2581 Secondary hyperparathyroidism of renal origin: Secondary | ICD-10-CM | POA: Diagnosis not present

## 2016-07-29 DIAGNOSIS — N184 Chronic kidney disease, stage 4 (severe): Secondary | ICD-10-CM | POA: Diagnosis not present

## 2016-07-29 DIAGNOSIS — E611 Iron deficiency: Secondary | ICD-10-CM | POA: Diagnosis not present

## 2016-07-29 DIAGNOSIS — D509 Iron deficiency anemia, unspecified: Secondary | ICD-10-CM | POA: Diagnosis not present

## 2016-07-29 MED ORDER — IRON 325 (65 FE) MG PO TABS: 1.0000 | ORAL_TABLET | Freq: Every day | ORAL | 0 refills | Status: DC

## 2016-07-29 NOTE — Telephone Encounter (Signed)
Please schedule GI consultation due to iron deficiency anemia

## 2016-07-29 NOTE — Telephone Encounter (Signed)
[facility] @ [facility] called to advise that Dr. Joelyn Oms did identify pt's iron deficiency. He has started pt on oral iron supplements and recommended pt do a fecal occult and have a GI consult. He would like you to initiate these. Please call his office if you have any questions. PO Iron added to pt's medication list.    Dr. Raliegh Ip - [facility]. Thanks!

## 2016-08-01 ENCOUNTER — Encounter: Payer: Self-pay | Admitting: Family Medicine

## 2016-08-01 NOTE — Telephone Encounter (Signed)
Referral placed.

## 2016-08-01 NOTE — Addendum Note (Signed)
Addended by: Beckie Busing on: 08/01/2016 08:31 AM   Modules accepted: Orders

## 2016-08-04 ENCOUNTER — Encounter: Payer: Self-pay | Admitting: Gastroenterology

## 2016-08-04 ENCOUNTER — Ambulatory Visit (INDEPENDENT_AMBULATORY_CARE_PROVIDER_SITE_OTHER): Payer: Medicare HMO | Admitting: Gastroenterology

## 2016-08-04 ENCOUNTER — Other Ambulatory Visit (INDEPENDENT_AMBULATORY_CARE_PROVIDER_SITE_OTHER): Payer: Medicare HMO

## 2016-08-04 VITALS — BP 144/60 | HR 86 | Ht 69.0 in | Wt 164.0 lb

## 2016-08-04 DIAGNOSIS — Z8719 Personal history of other diseases of the digestive system: Secondary | ICD-10-CM

## 2016-08-04 DIAGNOSIS — Z8711 Personal history of peptic ulcer disease: Secondary | ICD-10-CM

## 2016-08-04 DIAGNOSIS — D649 Anemia, unspecified: Secondary | ICD-10-CM

## 2016-08-04 LAB — CBC WITH DIFFERENTIAL/PLATELET
BASOS ABS: 0.1 10*3/uL (ref 0.0–0.1)
Basophils Relative: 1.1 % (ref 0.0–3.0)
EOS ABS: 0.3 10*3/uL (ref 0.0–0.7)
Eosinophils Relative: 6 % — ABNORMAL HIGH (ref 0.0–5.0)
HEMATOCRIT: 32.8 % — AB (ref 39.0–52.0)
HEMOGLOBIN: 10.7 g/dL — AB (ref 13.0–17.0)
LYMPHS PCT: 18.3 % (ref 12.0–46.0)
Lymphs Abs: 1 10*3/uL (ref 0.7–4.0)
MCHC: 32.5 g/dL (ref 30.0–36.0)
MCV: 78.5 fl (ref 78.0–100.0)
MONO ABS: 0.5 10*3/uL (ref 0.1–1.0)
Monocytes Relative: 9.8 % (ref 3.0–12.0)
Neutro Abs: 3.5 10*3/uL (ref 1.4–7.7)
Neutrophils Relative %: 64.8 % (ref 43.0–77.0)
Platelets: 233 10*3/uL (ref 150.0–400.0)
RBC: 4.18 Mil/uL — ABNORMAL LOW (ref 4.22–5.81)
RDW: 19.6 % — ABNORMAL HIGH (ref 11.5–15.5)
WBC: 5.4 10*3/uL (ref 4.0–10.5)

## 2016-08-04 LAB — IRON AND TIBC
%SAT: 25 % (ref 15–60)
IRON: 107 ug/dL (ref 50–180)
TIBC: 435 ug/dL — AB (ref 250–425)
UIBC: 328 ug/dL

## 2016-08-04 LAB — FERRITIN: FERRITIN: 12 ng/mL — AB (ref 22.0–322.0)

## 2016-08-04 LAB — VITAMIN B12: VITAMIN B 12: 1335 pg/mL — AB (ref 211–911)

## 2016-08-04 MED ORDER — OMEPRAZOLE 20 MG PO CPDR: 20.0000 mg | DELAYED_RELEASE_CAPSULE | Freq: Every day | ORAL | 3 refills | Status: DC

## 2016-08-04 NOTE — Progress Notes (Signed)
HPI :  83 y/o male with a history of stage IV CKD, chronic anemia, CAD with cardiomyopathy, here for an evaluation for anemia. Last seen in March 2015 by Dr. Deatra Ina, he is a new patient to me.  He denies any blood in the stools. He is having baseline constipation, he has a bowel movement every other day. He takes OTC laxatives which work well for him. No abdominal pains. Eating well. No nausea or vomiting. He takes tylenol as needed, maybe every 2-3 days or so. He denies NSAID use. He denies using any PPI. He has some fatigue at times. Stool is brown.  He has a (+) AB for IF in 2013, and has a history of B12 deficiency. He was just given iron to start it yesterday. He has a history of a gastric ulcer in 2013, tested negative for H pylori at the time. He had a lower GI bleed thought to be due to diverticulosis in 2017. Colonoscopy at that time did not show any other pathology.  He has CKD but is not on dialysis. He denies any cardiopulmonary symptoms. He denies using any blood thinners. Followed by cardiology. He takes Trental.  Endoscopic history: EGD 06/10/2011 - antral ulcer 49mm in size. Biopsies negative for H pylori. Colonoscopy - 05/12/2015 - during GI bleed, extensive diverticulosis / hemorrhoids, thought to have left sided colonic bleed Colonoscopy 04/2008 - 86mm polyp, segmental colitis of left colon - unclear if related to diverticulosis, biopsies nonspecific   Echo 05/09/16 - EF 30-35%  Past Medical History:  Diagnosis Date  . Arthritis    "touch in my fingers" (09/10/2014)  . B12 deficiency anemia    "stopped taking the shots in ~ 06/2014" (09/10/2014)  . CAD (coronary artery disease)    a. H/o MI in 1998;  b. s/p CABG 2003. c. 10/2014 NSTEMI/Cath: LM 75, LAD 126m/d, D1 95, D2 50, LCX 100ost/p, OM1 80, OM2 80, RCA 100p/m, RPDA fills via L->L collats, LIMA->LAD ok, VG->dRCA 100, VG->OM1->OM2 99 prox to OM1 insertion->recanalated ->Tx with heparin,  VG->D1 100-->Med Rx.  . Chronic  systolic CHF (congestive heart failure) ([place])    a. EF 40-45% in 10/2014 (previously normal in 08/2014).  . CKD (chronic kidney disease), stage IV ([place])   . Diabetes 1.5, managed as type 1 ([place])   . History of stomach ulcers 2013  . Hyperlipidemia   . Hypertensive heart disease   . Ischemic cardiomyopathy    a. 10/2014 Echo: EF 40-45%.  Marland Kitchen PVD (peripheral vascular disease) ([place])   . Type II diabetes mellitus ([place])      Past Surgical History:  Procedure Laterality Date  . CARDIAC CATHETERIZATION  2003;   . CARDIAC CATHETERIZATION N/A 11/07/2014   Left Heart Cath; Belva Crome, MD;   . CATARACT EXTRACTION, BILATERAL Bilateral   . CHOLECYSTECTOMY N/A 05/20/2015   Procedure: LAPAROSCOPIC CHOLECYSTECTOMY;  Surgeon: Coralie Keens, MD;  Location: [facility];  Service: General;  Laterality: N/A;  . COLONOSCOPY    . COLONOSCOPY N/A 05/12/2015   Procedure: COLONOSCOPY;  Surgeon: Milus Banister, MD;  Location: [facility];  Service: Endoscopy;  Laterality: N/A;  . [place]   [name] 07/13/2010  . CORONARY ARTERY BYPASS GRAFT  2003   CABG X5  . ESOPHAGOGASTRODUODENOSCOPY  06/10/2011   Procedure: ESOPHAGOGASTRODUODENOSCOPY (EGD);  Surgeon: Ladene Artist, MD,FACG;  Location: [facility] ENDOSCOPY;  Service: Endoscopy;  Laterality: N/A;   Family History  Problem Relation Age of Onset  . Stroke Mother   .  Hypertension Mother   . Cancer Mother   . Diabetes Unknown        brothers and sisters  . Coronary artery disease Unknown   . Hypertension Sister   . Heart attack Sister    Social History  Substance Use Topics  . Smoking status: Former Smoker    Packs/day: 0.50    Years: 20.00    Types: Cigarettes  . Smokeless tobacco: Never Used     Comment: "quit smoking cigarettes in the 1960s   . Alcohol use No   Current Outpatient Prescriptions  Medication Sig Dispense Refill  . amLODipine (NORVASC) 10 MG tablet Take 1 tablet (10 mg total) by mouth daily. 90 tablet 3  . aspirin EC 81 MG  tablet Take 81 mg by mouth at bedtime.    Marland Kitchen atorvastatin (LIPITOR) 80 MG tablet Take 1 tablet (80 mg total) by mouth daily at 6 PM. 8 tablet 0  . carvedilol (COREG) 25 MG tablet TAKE 1 TABLET BY MOUTH TWICE DAILY WITH MEALS  180 tablet 3  . cloNIDine (CATAPRES) 0.1 MG tablet TAKE 1 TABLET AT BEDTIME 90 tablet 3  . Ferrous Sulfate (IRON) 325 (65 Fe) MG TABS Take 1 tablet by mouth daily. 30 each 0  . furosemide (LASIX) 40 MG tablet TAKE 60 MG BY MOUTH IN THE AM & 40 MG BY MOUTH IN THE PM 225 tablet 3  . glucose blood (ACCU-CHEK AVIVA PLUS) test strip USE TO CHECK BLOOD SUGAR DAILY AND PRN 100 each 12  . hydrALAZINE (APRESOLINE) 25 MG tablet Take 1 tablet (25 mg total) by mouth 3 (three) times daily. 270 tablet 3  . insulin aspart (NOVOLOG FLEXPEN) 100 UNIT/ML FlexPen Inject 16 Units into the skin 3 (three) times daily with meals. 45 mL 3  . Insulin Glargine (LANTUS SOLOSTAR) 100 UNIT/ML Solostar Pen Inject 16 Units into the skin daily at 10 pm. 15 mL 3  . isosorbide mononitrate (IMDUR) 60 MG 24 hr tablet Take 2 tablets (120 mg total) by mouth daily. 180 tablet 3  . nitroGLYCERIN (NITROSTAT) 0.4 MG SL tablet Place 1 tablet (0.4 mg total) under the tongue every 5 (five) minutes as needed for chest pain (3 doses MAX). 25 tablet 3  . pentoxifylline (TRENTAL) 400 MG CR tablet TAKE 1 TABLET TWICE DAILY  AT  10AM  AND  5PM 180 tablet 3  . polyethylene glycol (MIRALAX / GLYCOLAX) packet Take 17 g by mouth daily as needed for mild constipation or moderate constipation. 30 each 1  . Potassium Chloride ER 20 MEQ TBCR Take 20 mEq by mouth daily. 90 tablet 3  . potassium chloride SA (K-DUR,KLOR-CON) 20 MEQ tablet TAKE 1 TABLET EVERY DAY 90 tablet 3  . timolol (TIMOPTIC) 0.5 % ophthalmic solution Place 1 drop into both eyes every morning.    . TRAVATAN Z 0.004 % SOLN ophthalmic solution Place 1 drop into both eyes every evening.     No current facility-administered medications for this visit.    No Known  Allergies   Review of Systems: All systems reviewed and negative except where noted in HPI.   CBC Latest Ref Rng & Units 07/18/2016 05/25/2015 05/21/2015  WBC 4.0 - 10.5 K/uL - 7.4 9.5  Hemoglobin 13.5 - 17.5 g/dL 9.9(A) 9.1(L) 9.1(L)  Hematocrit 39.0 - 52.0 % - 27.7(L) 28.8(L)  Platelets 150 - 400 K/uL - 311 289     Physical Exam: BP (!) 144/60 (BP Location: Right Arm, Patient Position: Sitting, Cuff Size: Normal)  Pulse 86   Ht 5\' 9"  (1.753 m)   Wt 164 lb (74.4 kg)   BMI 24.22 kg/m  Constitutional: Pleasant,well-developed, male in no acute distress. HEENT: Normocephalic and atraumatic. Conjunctivae are normal. No scleral icterus. Neck supple.  Cardiovascular: Normal rate, regular rhythm.  Pulmonary/chest: Effort normal and breath sounds normal. No wheezing, rales or rhonchi. Abdominal: Soft, nondistended, nontender.  There are no masses palpable. No hepatomegaly. Extremities: no edema Lymphadenopathy: No cervical adenopathy noted. Neurological: Alert and oriented to person place and time. Skin: Skin is warm and dry. No rashes noted. Psychiatric: Normal mood and affect. Behavior is normal.   ASSESSMENT AND PLAN: 83 year old male here to establish care for ongoing anemia. History as above, history of gastric ulcer in 2013 and lower GI bleed due to left sided diverticulosis in 2017. He's had an ongoing anemia but it is not clear if this is due to chronic kidney disease or iron deficiency. He just started iron yesterday, will send the lab today to obtain CBC with differential (I don't see a recent MCV) with iron studies, and B12 level, and clarify if this is iron deficiency versus anemia due to another etiology. 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 We'll also send stool for occult blood to see if this is positive.   I explained to him that if he has a clear iron deficiency or is having occult-positive  stool, we may consider performing an endoscopy and colonoscopy (last colonoscopy during active GI bleed with blood obscuring some views). Given his cardiac history and age, we would hope to avoid endoscopy in this patient of possible, which is his preference after discussing this issue. In the interim given his history of gastric ulcer and ongoing aspirin use, recommend omeprazole 20 mg once a day for prophylaxis. He did not have H pylori at the time his ulcer was diagnosed (biopsies negative). I'll await his labs and let him know the results and we can discuss the plan from there. He agreed.  McCord Cellar, MD [facility] Pager [phone]  CC: Marletta Lor, MD

## 2016-08-04 NOTE — Patient Instructions (Signed)
If you are age 81 or older, your body mass index should be between 23-30. Your Body mass index is 24.22 kg/m. If this is out of the aforementioned range listed, please consider follow up with your Primary Care Provider.  If you are age 74 or younger, your body mass index should be between 19-25. Your Body mass index is 24.22 kg/m. If this is out of the aformentioned range listed, please consider follow up with your Primary Care Provider.   Your physician has requested that you go to the basement for lab work before leaving today.  Follow the instructions on the Hemoccult cards and mail them back to [place] when you are finished or you may take them directly to the lab in the basement of the [facility] building. We will call you with the results.   We have sent the following medications to your pharmacy for you to pick up at your convenience:  Omeprazole  Thank you.

## 2016-08-05 ENCOUNTER — Telehealth: Payer: Self-pay | Admitting: Gastroenterology

## 2016-08-05 ENCOUNTER — Other Ambulatory Visit: Payer: Self-pay

## 2016-08-05 MED ORDER — OMEPRAZOLE 20 MG PO CPDR: 20.0000 mg | DELAYED_RELEASE_CAPSULE | Freq: Every day | ORAL | 0 refills | Status: DC

## 2016-08-05 NOTE — Telephone Encounter (Signed)
Omeprazole 20mg  qd #30 [phone] refills sent to [facility].

## 2016-08-09 ENCOUNTER — Other Ambulatory Visit: Payer: Self-pay | Admitting: Internal Medicine

## 2016-08-09 ENCOUNTER — Other Ambulatory Visit: Payer: Self-pay

## 2016-08-09 DIAGNOSIS — D649 Anemia, unspecified: Secondary | ICD-10-CM

## 2016-08-09 DIAGNOSIS — Z8719 Personal history of other diseases of the digestive system: Secondary | ICD-10-CM

## 2016-08-09 DIAGNOSIS — K259 Gastric ulcer, unspecified as acute or chronic, without hemorrhage or perforation: Secondary | ICD-10-CM

## 2016-08-12 ENCOUNTER — Other Ambulatory Visit (INDEPENDENT_AMBULATORY_CARE_PROVIDER_SITE_OTHER): Payer: Medicare HMO

## 2016-08-12 DIAGNOSIS — D649 Anemia, unspecified: Secondary | ICD-10-CM | POA: Diagnosis not present

## 2016-08-12 DIAGNOSIS — Z8711 Personal history of peptic ulcer disease: Secondary | ICD-10-CM

## 2016-08-12 DIAGNOSIS — Z8719 Personal history of other diseases of the digestive system: Secondary | ICD-10-CM | POA: Diagnosis not present

## 2016-08-12 LAB — HEMOCCULT SLIDES (X 3 CARDS)
FECAL OCCULT BLD: NEGATIVE
OCCULT 1: NEGATIVE
OCCULT 2: NEGATIVE
OCCULT 3: NEGATIVE
OCCULT 4: NEGATIVE
OCCULT 5: NEGATIVE

## 2016-08-29 ENCOUNTER — Ambulatory Visit (INDEPENDENT_AMBULATORY_CARE_PROVIDER_SITE_OTHER): Payer: Medicare HMO | Admitting: Family Medicine

## 2016-08-29 DIAGNOSIS — E538 Deficiency of other specified B group vitamins: Secondary | ICD-10-CM

## 2016-08-29 MED ORDER — CYANOCOBALAMIN 1000 MCG/ML IJ SOLN
1000.0000 ug | Freq: Once | INTRAMUSCULAR | Status: AC
  Administered 2016-08-29: 1000 ug via INTRAMUSCULAR

## 2016-09-13 ENCOUNTER — Other Ambulatory Visit: Payer: Self-pay | Admitting: Physician Assistant

## 2016-09-13 DIAGNOSIS — I2510 Atherosclerotic heart disease of native coronary artery without angina pectoris: Secondary | ICD-10-CM

## 2016-09-13 DIAGNOSIS — E785 Hyperlipidemia, unspecified: Secondary | ICD-10-CM

## 2016-09-13 NOTE — Telephone Encounter (Signed)
Please review for refill, Thanks !  

## 2016-09-19 ENCOUNTER — Ambulatory Visit (AMBULATORY_SURGERY_CENTER): Payer: Self-pay

## 2016-09-19 VITALS — Ht 70.0 in | Wt 166.8 lb

## 2016-09-19 DIAGNOSIS — D649 Anemia, unspecified: Secondary | ICD-10-CM

## 2016-09-26 ENCOUNTER — Encounter: Payer: Self-pay | Admitting: Internal Medicine

## 2016-09-26 ENCOUNTER — Ambulatory Visit (INDEPENDENT_AMBULATORY_CARE_PROVIDER_SITE_OTHER): Payer: Medicare HMO | Admitting: Internal Medicine

## 2016-09-26 VITALS — BP 104/50 | HR 67 | Temp 97.9°F | Ht 70.0 in | Wt 166.3 lb

## 2016-09-26 DIAGNOSIS — I255 Ischemic cardiomyopathy: Secondary | ICD-10-CM | POA: Diagnosis not present

## 2016-09-26 DIAGNOSIS — I10 Essential (primary) hypertension: Secondary | ICD-10-CM | POA: Diagnosis not present

## 2016-09-26 DIAGNOSIS — E538 Deficiency of other specified B group vitamins: Secondary | ICD-10-CM | POA: Diagnosis not present

## 2016-09-26 DIAGNOSIS — E0821 Diabetes mellitus due to underlying condition with diabetic nephropathy: Secondary | ICD-10-CM

## 2016-09-26 DIAGNOSIS — E119 Type 2 diabetes mellitus without complications: Secondary | ICD-10-CM | POA: Diagnosis not present

## 2016-09-26 DIAGNOSIS — I25709 Atherosclerosis of coronary artery bypass graft(s), unspecified, with unspecified angina pectoris: Secondary | ICD-10-CM | POA: Diagnosis not present

## 2016-09-26 LAB — POCT GLYCOSYLATED HEMOGLOBIN (HGB A1C): Hemoglobin A1C: 6.2

## 2016-09-26 MED ORDER — INSULIN ASPART 100 UNIT/ML FLEXPEN: 10.0000 [IU] | PEN_INJECTOR | Freq: Three times a day (TID) | SUBCUTANEOUS | 3 refills | Status: DC

## 2016-09-26 MED ORDER — CYANOCOBALAMIN 1000 MCG/ML IJ SOLN
1000.0000 ug | Freq: Once | INTRAMUSCULAR | Status: AC
  Administered 2016-09-26: 1000 ug via INTRAMUSCULAR

## 2016-09-26 NOTE — Progress Notes (Signed)
Subjective:    Patient ID: [name], male    DOB: 1931/11/02, 83 y.o.   MRN: [id number]  HPI  Lab Results  Component Value Date   HGBA1C 7.2 (H) 06/27/2016   83 year old patient in today for his quarterly follow-up. He remains on basal bolus insulin.  Last hemoglobin A1c 7.2. His lowest blood sugar over the past 3 months 89.  Fasting blood sugars generally run between 120 and 140 He has chronic kidney disease and is followed by nephrology.  He has a history of ischemic cardiomyopathy. No concerns or complaints  Past Medical History:  Diagnosis Date  . Arthritis    "touch in my fingers" (09/10/2014)  . B12 deficiency anemia    "stopped taking the shots in ~ 06/2014" (09/10/2014)  . CAD (coronary artery disease)    a. H/o MI in 1998;  b. s/p CABG 2003. c. 10/2014 NSTEMI/Cath: LM 75, LAD 150m/d, D1 95, D2 50, LCX 100ost/p, OM1 80, OM2 80, RCA 100p/m, RPDA fills via L->L collats, LIMA->LAD ok, VG->dRCA 100, VG->OM1->OM2 99 prox to OM1 insertion->recanalated ->Tx with heparin,  VG->D1 100-->Med Rx.  . Cataract   . Chronic systolic CHF (congestive heart failure) (HCC)    a. EF 40-45% in 10/2014 (previously normal in 08/2014).  . CKD (chronic kidney disease), stage IV ([place])   . Diabetes 1.5, managed as type 1 ([place])   . History of stomach ulcers 2013  . Hyperlipidemia   . Hypertension   . Hypertensive heart disease   . Ischemic cardiomyopathy    a. 10/2014 Echo: EF 40-45%.  Marland Kitchen PVD (peripheral vascular disease) ([place])   . Type II diabetes mellitus ([place])      Social History   Social History  . Marital status: Widowed    Spouse name: N/A  . Number of children: N/A  . Years of education: N/A   Occupational History  . Retired    Social History Main Topics  . Smoking status: Former Smoker    Packs/day: 0.50    Years: 20.00    Types: Cigarettes  . Smokeless tobacco: Never Used     Comment: "quit smoking cigarettes in the 1960s   . Alcohol use No  . Drug use: No  .  Sexual activity: Not Currently   Other Topics Concern  . Not on file   Social History Narrative   Pt lives alone, family nearby.    Past Surgical History:  Procedure Laterality Date  . CARDIAC CATHETERIZATION  2003;   . CARDIAC CATHETERIZATION N/A 11/07/2014   Left Heart Cath; Belva Crome, MD;   . CATARACT EXTRACTION, BILATERAL Bilateral   . CHOLECYSTECTOMY N/A 05/20/2015   Procedure: LAPAROSCOPIC CHOLECYSTECTOMY;  Surgeon: Coralie Keens, MD;  Location: [facility];  Service: General;  Laterality: N/A;  . COLONOSCOPY    . COLONOSCOPY N/A 05/12/2015   Procedure: COLONOSCOPY;  Surgeon: Milus Banister, MD;  Location: [facility];  Service: Endoscopy;  Laterality: N/A;  . [place]   [name] 07/13/2010  . CORONARY ARTERY BYPASS GRAFT  2003   CABG X5  . ESOPHAGOGASTRODUODENOSCOPY  06/10/2011   Procedure: ESOPHAGOGASTRODUODENOSCOPY (EGD);  Surgeon: Ladene Artist, MD,FACG;  Location: [facility] ENDOSCOPY;  Service: Endoscopy;  Laterality: N/A;    Family History  Problem Relation Age of Onset  . Stroke Mother   . Hypertension Mother   . Cancer Mother   . Diabetes Unknown        brothers and sisters  . Coronary artery  disease Unknown   . Hypertension Sister   . Heart attack Sister   . Colon cancer Neg Hx     No Known Allergies  Current Outpatient Prescriptions on File Prior to Visit  Medication Sig Dispense Refill  . amLODipine (NORVASC) 10 MG tablet Take 1 tablet (10 mg total) by mouth daily. 90 tablet 3  . aspirin EC 81 MG tablet Take 81 mg by mouth at bedtime.    Marland Kitchen atorvastatin (LIPITOR) 80 MG tablet TAKE 1 TABLET DAILY AT 6 PM 90 tablet 3  . BD PEN NEEDLE NANO U/F 32G X 4 MM MISC USE TWO TIMES DAILY AS NEEDED. 180 each 4  . carvedilol (COREG) 25 MG tablet TAKE 1 TABLET BY MOUTH TWICE DAILY WITH MEALS  180 tablet 3  . cloNIDine (CATAPRES) 0.1 MG tablet TAKE 1 TABLET AT BEDTIME 90 tablet 3  . Ferrous Sulfate (IRON) 325 (65 Fe) MG TABS Take 1 tablet by mouth daily. 30  each 0  . furosemide (LASIX) 40 MG tablet TAKE 60 MG BY MOUTH IN THE AM & 40 MG BY MOUTH IN THE PM 225 tablet 3  . glucose blood (ACCU-CHEK AVIVA PLUS) test strip USE TO CHECK BLOOD SUGAR DAILY AND PRN 100 each 12  . insulin aspart (NOVOLOG FLEXPEN) 100 UNIT/ML FlexPen Inject 16 Units into the skin 3 (three) times daily with meals. (Patient taking differently: Inject 14 Units into the skin 3 (three) times daily with meals. ) 45 mL 3  . Insulin Glargine (LANTUS SOLOSTAR) 100 UNIT/ML Solostar Pen Inject 16 Units into the skin daily at 10 pm. (Patient taking differently: Inject 18 Units into the skin daily at 10 pm. ) 15 mL 3  . nitroGLYCERIN (NITROSTAT) 0.4 MG SL tablet Place 1 tablet (0.4 mg total) under the tongue every 5 (five) minutes as needed for chest pain (3 doses MAX). 25 tablet 3  . omeprazole (PRILOSEC) 20 MG capsule Take 1 capsule (20 mg total) by mouth daily. 30 capsule 0  . pentoxifylline (TRENTAL) 400 MG CR tablet TAKE 1 TABLET TWICE DAILY  AT  10AM  AND  5PM 180 tablet 3  . polyethylene glycol (MIRALAX / GLYCOLAX) packet Take 17 g by mouth daily as needed for mild constipation or moderate constipation. 30 each 1  . Potassium Chloride ER 20 MEQ TBCR Take 20 mEq by mouth daily. 90 tablet 3  . potassium chloride SA (K-DUR,KLOR-CON) 20 MEQ tablet TAKE 1 TABLET EVERY DAY 90 tablet 3  . timolol (TIMOPTIC) 0.5 % ophthalmic solution Place 1 drop into both eyes every morning.    . TRAVATAN Z 0.004 % SOLN ophthalmic solution Place 1 drop into both eyes every evening.    . hydrALAZINE (APRESOLINE) 25 MG tablet Take 1 tablet (25 mg total) by mouth 3 (three) times daily. 270 tablet 3  . isosorbide mononitrate (IMDUR) 60 MG 24 hr tablet Take 2 tablets (120 mg total) by mouth daily. 180 tablet 3   No current facility-administered medications on file prior to visit.     BP (!) 104/50 (BP Location: Left Arm, Patient Position: Sitting, Cuff Size: Normal)   Pulse 67   Temp 97.9 F (36.6 C) (Oral)    Ht 5\' 10"  (1.778 m)   Wt 166 lb 4.8 oz (75.4 kg)   SpO2 98%   BMI 23.86 kg/m     Review of Systems  Constitutional: Negative for appetite change, chills, fatigue and fever.  HENT: Negative for congestion, dental problem, ear pain, hearing  loss, sore throat, tinnitus, trouble swallowing and voice change.   Eyes: Negative for pain, discharge and visual disturbance.  Respiratory: Negative for cough, chest tightness, wheezing and stridor.   Cardiovascular: Negative for chest pain, palpitations and leg swelling.  Gastrointestinal: Negative for abdominal distention, abdominal pain, blood in stool, constipation, diarrhea, nausea and vomiting.  Genitourinary: Negative for difficulty urinating, discharge, flank pain, genital sores, hematuria and urgency.  Musculoskeletal: Negative for arthralgias, back pain, gait problem, joint swelling, myalgias and neck stiffness.  Skin: Negative for rash.  Neurological: Negative for dizziness, syncope, speech difficulty, weakness, numbness and headaches.  Hematological: Negative for adenopathy. Does not bruise/bleed easily.  Psychiatric/Behavioral: Negative for behavioral problems and dysphoric mood. The patient is not nervous/anxious.        Objective:   Physical Exam  Constitutional: He is oriented to person, place, and time. He appears well-developed.  Repeat blood pressure 130/60 Weight 166  HENT:  Head: Normocephalic.  Right Ear: External ear normal.  Left Ear: External ear normal.  Eyes: Conjunctivae and EOM are normal.  Neck: Normal range of motion.  Left bruit  Cardiovascular: Normal rate and normal heart sounds.   Pulmonary/Chest: He has rales.  Few crackles right base  Abdominal: Bowel sounds are normal.  Epigastric bruit  Musculoskeletal: Normal range of motion. He exhibits no edema or tenderness.  Neurological: He is alert and oriented to person, place, and time.  Psychiatric: He has a normal mood and affect. His behavior is normal.            Assessment & Plan:   Diabetes mellitus.  Will review a hemoglobin A1c  ( .  Hemoglobin A1c 6.2.  We'll down titrate mealtime insulin slightly) Ischemic cardiomyopathy.  Continue present regimen Chronic kidney disease.  Follow-up nephrology B12 deficiency.  1 mg vitamin B12 given IM  Dyslipidemia.  Continue statin therapy  Follow-up 3 months  [id number],PETER [name]

## 2016-09-26 NOTE — Patient Instructions (Addendum)
Limit your sodium (Salt) intake   Please check your hemoglobin A1c every 3 months    It is important that you exercise regularly, at least 20 minutes 3 to 4 times per week.  If you develop chest pain or shortness of breath seek  medical attention.  Cardiology and nephrology follow-up as scheduled  Decrease mealtime insulin to 10 units prior to each meal

## 2016-09-29 ENCOUNTER — Other Ambulatory Visit: Payer: Self-pay

## 2016-09-30 ENCOUNTER — Ambulatory Visit: Payer: Medicare HMO

## 2016-10-03 ENCOUNTER — Encounter (HOSPITAL_COMMUNITY): Payer: Self-pay | Admitting: *Deleted

## 2016-10-04 ENCOUNTER — Ambulatory Visit (HOSPITAL_COMMUNITY): Payer: Medicare HMO | Admitting: Registered Nurse

## 2016-10-04 ENCOUNTER — Telehealth: Payer: Self-pay | Admitting: Gastroenterology

## 2016-10-04 ENCOUNTER — Ambulatory Visit (HOSPITAL_COMMUNITY)
Admission: RE | Admit: 2016-10-04 | Discharge: 2016-10-04 | Disposition: A | Discharge status: Home / Self Care | Payer: Medicare HMO | Source: Ambulatory Visit | Attending: Gastroenterology | Admitting: Gastroenterology

## 2016-10-04 ENCOUNTER — Encounter (HOSPITAL_COMMUNITY)
Admission: RE | Disposition: A | Discharge status: Home / Self Care | Payer: Self-pay | Source: Ambulatory Visit | Attending: Gastroenterology

## 2016-10-04 ENCOUNTER — Encounter (HOSPITAL_COMMUNITY): Payer: Self-pay

## 2016-10-04 DIAGNOSIS — M19049 Primary osteoarthritis, unspecified hand: Secondary | ICD-10-CM | POA: Diagnosis not present

## 2016-10-04 DIAGNOSIS — Z794 Long term (current) use of insulin: Secondary | ICD-10-CM | POA: Insufficient documentation

## 2016-10-04 DIAGNOSIS — Z87891 Personal history of nicotine dependence: Secondary | ICD-10-CM | POA: Diagnosis not present

## 2016-10-04 DIAGNOSIS — I252 Old myocardial infarction: Secondary | ICD-10-CM | POA: Insufficient documentation

## 2016-10-04 DIAGNOSIS — K2950 Unspecified chronic gastritis without bleeding: Secondary | ICD-10-CM | POA: Diagnosis not present

## 2016-10-04 DIAGNOSIS — E1122 Type 2 diabetes mellitus with diabetic chronic kidney disease: Secondary | ICD-10-CM | POA: Insufficient documentation

## 2016-10-04 DIAGNOSIS — I2510 Atherosclerotic heart disease of native coronary artery without angina pectoris: Secondary | ICD-10-CM | POA: Insufficient documentation

## 2016-10-04 DIAGNOSIS — Z8249 Family history of ischemic heart disease and other diseases of the circulatory system: Secondary | ICD-10-CM | POA: Diagnosis not present

## 2016-10-04 DIAGNOSIS — K3189 Other diseases of stomach and duodenum: Secondary | ICD-10-CM | POA: Diagnosis not present

## 2016-10-04 DIAGNOSIS — D510 Vitamin B12 deficiency anemia due to intrinsic factor deficiency: Secondary | ICD-10-CM | POA: Diagnosis not present

## 2016-10-04 DIAGNOSIS — K449 Diaphragmatic hernia without obstruction or gangrene: Secondary | ICD-10-CM | POA: Diagnosis not present

## 2016-10-04 DIAGNOSIS — I255 Ischemic cardiomyopathy: Secondary | ICD-10-CM | POA: Diagnosis not present

## 2016-10-04 DIAGNOSIS — Z8711 Personal history of peptic ulcer disease: Secondary | ICD-10-CM | POA: Insufficient documentation

## 2016-10-04 DIAGNOSIS — K209 Esophagitis, unspecified: Secondary | ICD-10-CM | POA: Insufficient documentation

## 2016-10-04 DIAGNOSIS — Z8719 Personal history of other diseases of the digestive system: Secondary | ICD-10-CM | POA: Diagnosis not present

## 2016-10-04 DIAGNOSIS — K31819 Angiodysplasia of stomach and duodenum without bleeding: Secondary | ICD-10-CM | POA: Insufficient documentation

## 2016-10-04 DIAGNOSIS — E1151 Type 2 diabetes mellitus with diabetic peripheral angiopathy without gangrene: Secondary | ICD-10-CM | POA: Insufficient documentation

## 2016-10-04 DIAGNOSIS — D649 Anemia, unspecified: Secondary | ICD-10-CM | POA: Diagnosis not present

## 2016-10-04 DIAGNOSIS — Z7982 Long term (current) use of aspirin: Secondary | ICD-10-CM | POA: Diagnosis not present

## 2016-10-04 DIAGNOSIS — Z86010 Personal history of colonic polyps: Secondary | ICD-10-CM | POA: Insufficient documentation

## 2016-10-04 DIAGNOSIS — K228 Other specified diseases of esophagus: Secondary | ICD-10-CM

## 2016-10-04 DIAGNOSIS — E785 Hyperlipidemia, unspecified: Secondary | ICD-10-CM | POA: Insufficient documentation

## 2016-10-04 DIAGNOSIS — N184 Chronic kidney disease, stage 4 (severe): Secondary | ICD-10-CM | POA: Insufficient documentation

## 2016-10-04 DIAGNOSIS — Z951 Presence of aortocoronary bypass graft: Secondary | ICD-10-CM | POA: Insufficient documentation

## 2016-10-04 DIAGNOSIS — Z79899 Other long term (current) drug therapy: Secondary | ICD-10-CM | POA: Insufficient documentation

## 2016-10-04 DIAGNOSIS — I5022 Chronic systolic (congestive) heart failure: Secondary | ICD-10-CM | POA: Insufficient documentation

## 2016-10-04 DIAGNOSIS — K259 Gastric ulcer, unspecified as acute or chronic, without hemorrhage or perforation: Secondary | ICD-10-CM

## 2016-10-04 DIAGNOSIS — I130 Hypertensive heart and chronic kidney disease with heart failure and stage 1 through stage 4 chronic kidney disease, or unspecified chronic kidney disease: Secondary | ICD-10-CM | POA: Diagnosis not present

## 2016-10-04 DIAGNOSIS — D509 Iron deficiency anemia, unspecified: Secondary | ICD-10-CM | POA: Diagnosis not present

## 2016-10-04 DIAGNOSIS — K250 Acute gastric ulcer with hemorrhage: Secondary | ICD-10-CM | POA: Diagnosis not present

## 2016-10-04 DIAGNOSIS — K208 Other esophagitis: Secondary | ICD-10-CM | POA: Diagnosis not present

## 2016-10-04 HISTORY — PX: ESOPHAGOGASTRODUODENOSCOPY: SHX5428

## 2016-10-04 LAB — GLUCOSE, CAPILLARY: GLUCOSE-CAPILLARY: 114 mg/dL — AB (ref 65–99)

## 2016-10-04 SURGERY — EGD (ESOPHAGOGASTRODUODENOSCOPY)
Anesthesia: Monitor Anesthesia Care

## 2016-10-04 MED ORDER — LIDOCAINE 2% (20 MG/ML) 5 ML SYRINGE
INTRAMUSCULAR | Status: AC
  Filled 2016-10-04: qty 5

## 2016-10-04 MED ORDER — PROPOFOL 10 MG/ML IV BOLUS
INTRAVENOUS | Status: DC | PRN
  Administered 2016-10-04 (×10): 20 mg via INTRAVENOUS
  Administered 2016-10-04: 30 mg via INTRAVENOUS
  Administered 2016-10-04: 20 mg via INTRAVENOUS

## 2016-10-04 MED ORDER — OMEPRAZOLE 20 MG PO CPDR: 20.0000 mg | DELAYED_RELEASE_CAPSULE | Freq: Two times a day (BID) | ORAL | 3 refills | Status: DC

## 2016-10-04 MED ORDER — SODIUM CHLORIDE 0.9 % IV SOLN
INTRAVENOUS | Status: DC
  Administered 2016-10-04: 500 mL via INTRAVENOUS

## 2016-10-04 MED ORDER — PROPOFOL 10 MG/ML IV BOLUS
INTRAVENOUS | Status: AC
  Filled 2016-10-04: qty 60

## 2016-10-04 MED ORDER — LIDOCAINE 2% (20 MG/ML) 5 ML SYRINGE
INTRAMUSCULAR | Status: DC | PRN
  Administered 2016-10-04: 60 mg via INTRAVENOUS

## 2016-10-04 NOTE — Op Note (Signed)
[facility] Patient Name: [name] Procedure Date: 10/04/2016 MRN: [id number] Attending MD: Carlota Raspberry. Armbruster MD, MD Date of Birth: 02-Apr-1931 CSN: [id number] Age: 83 Admit Type: Outpatient Procedure:                Upper GI endoscopy Indications:              multifactorial anemia - Iron deficiency anemia,                            Pernicious anemia (positive IF antibody), history                            of peptic ulcer disease Providers:                Carlota Raspberry. Armbruster MD, MD, Laverta Baltimore RN,                            RN, Cherylynn Ridges, Technician, Marla Roe, CRNA Referring MD:              Medicines:                Monitored Anesthesia Care Complications:            No immediate complications. Estimated blood loss:                            Minimal. Estimated Blood Loss:     Estimated blood loss was minimal. Procedure:                Pre-Anesthesia Assessment:                           - Prior to the procedure, a History and Physical                            was performed, and patient medications and                            allergies were reviewed. The patient's tolerance of                            previous anesthesia was also reviewed. The risks                            and benefits of the procedure and the sedation                            options and risks were discussed with the patient.                            All questions were answered, and informed consent                            was obtained. Prior Anticoagulants: The patient has  taken aspirin, last dose was 1 day prior to                            procedure. ASA Grade Assessment: III - A patient                            with severe systemic disease. After reviewing the                            risks and benefits, the patient was deemed in                            satisfactory condition to undergo the procedure.              After obtaining informed consent, the endoscope was                            passed under direct vision. Throughout the                            procedure, the patient's blood pressure, pulse, and                            oxygen saturations were monitored continuously. The                            [id number]) scope was introduced through the                            mouth, and advanced to the second part of duodenum.                            The upper GI endoscopy was accomplished without                            difficulty. The patient tolerated the procedure                            well. Scope In: Scope Out: Findings:      Esophagogastric landmarks were identified: the Z-line was found at 42       cm, the gastroesophageal junction was found at 42 cm and the upper       extent of the gastric folds was found at 44 cm from the incisors.      A 2 cm hiatal hernia was present.      The Z-line was irregular with a slight extension of salmon colored       mucosa (5-71m) with an irregularm slightly raised proximal border.       Biopsies were taken with a cold forceps for histology.      The exam of the esophagus was otherwise normal.      Patchy mildly erythematous mucosa was found in the gastric antrum       without ulceration. Biopsies were taken with a cold forceps for       histology.  Diffuse atrophic mucosa was found in the entire examined stomach.       Biopsies were taken with a cold forceps for histology from the antrum,       body, and incisura.      The exam of the stomach was otherwise normal.      Three small angiodysplastic lesions were found in the second portion of       the duodenum. Fulguration to ablate the lesion to prevent bleeding by       argon plasma was successful.      A single small angiodysplastic lesion without bleeding was found in the       duodenal bulb. Fulguration to ablate the lesion to prevent bleeding by        argon plasma was successful.      The exam of the duodenum was otherwise normal. Impression:               - Esophagogastric landmarks identified.                           - 2 cm hiatal hernia.                           - Z-line irregular. Biopsied.                           - Erythematous mucosa in the antrum. Biopsied.                           - Gastric mucosal atrophy. Biopsied.                           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                            Treated with argon plasma coagulation (APC).                           - A single non-bleeding angiodysplastic lesion in                            the duodenum. Treated with argon plasma coagulation                            (APC). Moderate Sedation:      No moderate sedation, case performed with MAC Recommendation:           - Patient has a contact number available for                            emergencies. The signs and symptoms of potential                            delayed complications were discussed with the                            patient. Return to normal activities tomorrow.  Written discharge instructions were provided to the                            patient.                           - Resume previous diet.                           - Continue present medications.                           - Increase omeprazole to 57m twice daily                           - Await pathology results.                           - No NSAIDS other than aspirin Procedure Code(s):        --- Professional ---                           [id number], Esophagogastroduodenoscopy, flexible,                            transoral; with control of bleeding, any method                           43239, Esophagogastroduodenoscopy, flexible,                            transoral; with biopsy, single or multiple Diagnosis Code(s):        --- Professional ---                           K44.9,  Diaphragmatic hernia without obstruction or                            gangrene                           K22.8, Other specified diseases of esophagus                           K31.89, Other diseases of stomach and duodenum                           K31.819, Angiodysplasia of stomach and duodenum                            without bleeding                           D50.9, Iron deficiency anemia, unspecified                           D51.0, Vitamin B12 deficiency anemia due to  intrinsic factor deficiency CPT copyright 2016 American Medical Association. All rights reserved. The codes documented in this report are preliminary and upon coder review may  be revised to meet current compliance requirements. Remo Lipps P. Armbruster MD, MD 10/04/2016 11:06:39 AM This report has been signed electronically. Number of Addenda: 0

## 2016-10-04 NOTE — Transfer of Care (Signed)
Immediate Anesthesia Transfer of Care Note  Patient: [name]  Procedure(s) Performed: Procedure(s): ESOPHAGOGASTRODUODENOSCOPY (EGD) (N/A)  Patient Location: PACU  Anesthesia Type:MAC  Level of Consciousness:  sedated, patient cooperative and responds to stimulation  Airway & Oxygen Therapy:Patient Spontanous Breathing and Patient connected to face mask oxgen  Post-op Assessment:  Report given to PACU RN and Post -op Vital signs reviewed and stable  Post vital signs:  Reviewed and stable  Last Vitals:  Vitals:   10/04/16 0944  BP: 140/61  Pulse: 79  Resp: 13  Temp: 53.6 C    Complications: No apparent anesthesia complications

## 2016-10-04 NOTE — Anesthesia Preprocedure Evaluation (Signed)
Anesthesia Evaluation  Patient identified by MRN, date of birth, ID band Patient awake    Reviewed: Allergy & Precautions, NPO status , Patient's Chart, lab work & pertinent test results  Airway Mallampati: II  TM Distance: >3 FB Neck ROM: Full    Dental no notable dental hx.    Pulmonary neg pulmonary ROS, former smoker,    Pulmonary exam normal breath sounds clear to auscultation       Cardiovascular hypertension, + CAD, + Past MI and +CHF  Normal cardiovascular exam Rhythm:Regular Rate:Normal     Neuro/Psych negative neurological ROS  negative psych ROS   GI/Hepatic Neg liver ROS, PUD,   Endo/Other  diabetes  Renal/GU negative Renal ROS  negative genitourinary   Musculoskeletal negative musculoskeletal ROS (+)   Abdominal   Peds negative pediatric ROS (+)  Hematology negative hematology ROS (+)   Anesthesia Other Findings   Reproductive/Obstetrics negative OB ROS                             Anesthesia Physical Anesthesia Plan  ASA: III  Anesthesia Plan: MAC   Post-op Pain Management:    Induction: Intravenous  PONV Risk Score and Plan: 0  Airway Management Planned: Simple Face Mask  Additional Equipment:   Intra-op Plan:   Post-operative Plan:   Informed Consent: I have reviewed the patients History and Physical, chart, labs and discussed the procedure including the risks, benefits and alternatives for the proposed anesthesia with the patient or authorized representative who has indicated his/her understanding and acceptance.   Dental advisory given  Plan Discussed with: CRNA and Surgeon  Anesthesia Plan Comments:         Anesthesia Quick Evaluation

## 2016-10-04 NOTE — Discharge Instructions (Signed)
Esophagogastroduodenoscopy, Care After °Refer to this sheet in the next few weeks. These instructions provide you with information about caring for yourself after your procedure. Your health care provider may also give you more specific instructions. Your treatment has been planned according to current medical practices, but problems sometimes occur. Call your health care provider if you have any problems or questions after your procedure. °What can I expect after the procedure? °After the procedure, it is common to have: °· A sore throat. °· Nausea. °· Bloating. °· Dizziness. °· Fatigue. ° °Follow these instructions at home: °· Do not eat or drink anything until the numbing medicine (local anesthetic) has worn off and your gag reflex has returned. You will know that the local anesthetic has worn off when you can swallow comfortably. °· Do not drive for 24 hours if you received a medicine to help you relax (sedative). °· If your health care provider took a tissue sample for testing during the procedure, make sure to get your test results. This is your responsibility. Ask your health care provider or the department performing the test when your results will be ready. °· Keep all follow-up visits as told by your health care provider. This is important. °Contact a health care provider if: °· You cannot stop coughing. °· You are not urinating. °· You are urinating less than usual. °Get help right away if: °· You have trouble swallowing. °· You cannot eat or drink. °· You have throat or chest pain that gets worse. °· You are dizzy or light-headed. °· You faint. °· You have nausea or vomiting. °· You have chills. °· You have a fever. °· You have severe abdominal pain. °· You have black, tarry, or bloody stools. °This information is not intended to replace advice given to you by your health care provider. Make sure you discuss any questions you have with your health care provider. °Document Released: 02/01/2012 Document  Revised: 07/23/2015 Document Reviewed: 01/08/2015 °Elsevier Interactive Patient Education © 2018 [employer] ° °

## 2016-10-04 NOTE — Telephone Encounter (Signed)
Telephone note started in error, wrong patient

## 2016-10-04 NOTE — Anesthesia Postprocedure Evaluation (Signed)
Anesthesia Post Note  Patient: [name]  Procedure(s) Performed: Procedure(s) (LRB): ESOPHAGOGASTRODUODENOSCOPY (EGD) (N/A)     Patient location during evaluation: PACU Anesthesia Type: MAC Level of consciousness: awake and alert Pain management: pain level controlled Vital Signs Assessment: post-procedure vital signs reviewed and stable Respiratory status: spontaneous breathing, nonlabored ventilation, respiratory function stable and patient connected to nasal cannula oxygen Cardiovascular status: stable and blood pressure returned to baseline Anesthetic complications: no    Last Vitals:  Vitals:   10/04/16 1110 10/04/16 1120  BP: (!) 126/51 127/60  Pulse: 60 (!) 55  Resp: (!) 23 16  Temp:      Last Pain:  Vitals:   10/04/16 0944  TempSrc: Oral                 Sir Mallis S

## 2016-10-04 NOTE — Anesthesia Procedure Notes (Signed)
Date/Time: 10/04/2016 10:31 AM Performed by: Talbot Grumbling Oxygen Delivery Method: Nasal cannula

## 2016-10-04 NOTE — Interval H&P Note (Signed)
History and Physical Interval Note:  10/04/2016 10:14 AM  [name]  has presented today for surgery, with the diagnosis of anemia, hx of lower GI bleed, hx of gastric ulcer  The various methods of treatment have been discussed with the patient and family. After consideration of risks, benefits and other options for treatment, the patient has consented to  Procedure(s): ESOPHAGOGASTRODUODENOSCOPY (EGD) (N/A) as a surgical intervention .  The patient's history has been reviewed, patient examined, no change in status, stable for surgery.  I have reviewed the patient's chart and labs.  Questions were answered to the patient's satisfaction.     Renelda Loma Armbruster

## 2016-10-04 NOTE — H&P (Signed)
HPI :  83 y/o male with history of CAD, CHF, stage IV CKD, here for upper endoscopy to evaluate what is suspected to be multifactorial anemia. History of B12 deficiency with ? Autoimmune gastritis, also with history of PUD. Low ferritin with normal iron level, hemeoccult negative. No overt bleeding. He has declined colonoscopy but agreeable to upper endoscopy.   Endoscopic history: EGD 06/10/2011 - antral ulcer 65mm in size. Biopsies negative for H pylori. Colonoscopy - 05/12/2015 - during GI bleed, extensive diverticulosis / hemorrhoids, thought to have left sided colonic bleed Colonoscopy 04/2008 - 35mm polyp, segmental colitis of left colon - unclear if related to diverticulosis, biopsies nonspecific  Past Medical History:  Diagnosis Date  . Arthritis    "touch in my fingers" (09/10/2014)  . B12 deficiency anemia    "stopped taking the shots in ~ 06/2014" (09/10/2014)  . CAD (coronary artery disease)    a. H/o MI in 1998;  b. s/p CABG 2003. c. 10/2014 NSTEMI/Cath: LM 75, LAD 136m/d, D1 95, D2 50, LCX 100ost/p, OM1 80, OM2 80, RCA 100p/m, RPDA fills via L->L collats, LIMA->LAD ok, VG->dRCA 100, VG->OM1->OM2 99 prox to OM1 insertion->recanalated ->Tx with heparin,  VG->D1 100-->Med Rx.  . Cataract   . Chronic systolic CHF (congestive heart failure) (HCC)    a. EF 40-45% in 10/2014 (previously normal in 08/2014).  . CKD (chronic kidney disease), stage IV ([place])   . Diabetes 1.5, managed as type 1 ([place])   . History of stomach ulcers 2013  . Hyperlipidemia   . Hypertension   . Hypertensive heart disease   . Ischemic cardiomyopathy    a. 10/2014 Echo: EF 40-45%.  . Myocardial infarction ([place])   . PVD (peripheral vascular disease) ([place])   . Type II diabetes mellitus ([place])      Past Surgical History:  Procedure Laterality Date  . CARDIAC CATHETERIZATION  2003;   . CARDIAC CATHETERIZATION N/A 11/07/2014   Left Heart Cath; Belva Crome, MD;   . CATARACT EXTRACTION, BILATERAL Bilateral   .  CHOLECYSTECTOMY N/A 05/20/2015   Procedure: LAPAROSCOPIC CHOLECYSTECTOMY;  Surgeon: Coralie Keens, MD;  Location: [place];  Service: General;  Laterality: N/A;  . COLONOSCOPY    . COLONOSCOPY N/A 05/12/2015   Procedure: COLONOSCOPY;  Surgeon: Milus Banister, MD;  Location: [place];  Service: Endoscopy;  Laterality: N/A;  . [place]   [name] 07/13/2010  . CORONARY ARTERY BYPASS GRAFT  2003   CABG X5  . ESOPHAGOGASTRODUODENOSCOPY  06/10/2011   Procedure: ESOPHAGOGASTRODUODENOSCOPY (EGD);  Surgeon: Ladene Artist, MD,FACG;  Location: [facility] ENDOSCOPY;  Service: Endoscopy;  Laterality: N/A;   Family History  Problem Relation Age of Onset  . Stroke Mother   . Hypertension Mother   . Cancer Mother   . Diabetes Unknown        brothers and sisters  . Coronary artery disease Unknown   . Hypertension Sister   . Heart attack Sister   . Colon cancer Neg Hx    Social History  Substance Use Topics  . Smoking status: Former Smoker    Packs/day: 0.50    Years: 20.00    Types: Cigarettes  . Smokeless tobacco: Never Used     Comment: "quit smoking cigarettes in the 1960s   . Alcohol use No   Current Facility-Administered Medications  Medication Dose Route Frequency Provider Last Rate Last Dose  . 0.9 %  sodium chloride infusion   Intravenous Continuous Raun Routh, Renelda Loma, MD  20 mL/hr at 10/04/16 1003 500 mL at 10/04/16 1003   No Known Allergies   Review of Systems: All systems reviewed and negative except where noted in HPI.   Lab Results  Component Value Date   WBC 5.4 08/04/2016   HGB 10.7 (L) 08/04/2016   HCT 32.8 (L) 08/04/2016   MCV 78.5 08/04/2016   PLT 233.0 08/04/2016    Lab Results  Component Value Date   CREATININE 3.7 (A) 07/18/2016   BUN 50 (A) 07/18/2016   NA 139 07/18/2016   K 4.3 07/18/2016   CL 105 03/28/2016   CO2 25 03/28/2016    Lab Results  Component Value Date   ALT 12 06/19/2015   AST 13 06/19/2015   ALKPHOS 77 06/19/2015     BILITOT 0.4 06/19/2015    Lab Results  Component Value Date   IRON 107 08/04/2016   TIBC 435 (H) 08/04/2016   FERRITIN 12.0 (L) 08/04/2016     Physical Exam: BP 140/61   Pulse 79   Temp 97.7 F (36.5 C) (Oral)   Resp 13   Ht 5\' 10"  (1.778 m)   Wt 160 lb (72.6 kg)   SpO2 98%   BMI 22.96 kg/m  Constitutional: Pleasant,well-developed, male in no acute distress. Cardiovascular: Normal rate, regular rhythm.  Pulmonary/chest: Effort normal and breath sounds normal. No wheezing, rales or rhonchi. Abdominal: Soft, nondistended, nontender. There are no masses palpable. No hepatomegaly. 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 Relatively recent colonoscopy without clear etiology, stool occult negative for blood. Patient agreeable to upper endoscopy to further evaluate. I have discussed risks / benefits of upper endoscopy with him and he was agreeable to proceed. All questions answered. On omeprazole 20mg  daily.   Witherbee Cellar, MD [facility] Pager [phone]

## 2016-10-05 ENCOUNTER — Other Ambulatory Visit: Payer: Self-pay

## 2016-10-05 DIAGNOSIS — D649 Anemia, unspecified: Secondary | ICD-10-CM

## 2016-10-10 ENCOUNTER — Telehealth: Payer: Self-pay

## 2016-10-10 NOTE — Telephone Encounter (Signed)
Patient's son [name] returning your phone calls states he was unable to retreive voicemail you left and best call back # is [phone].

## 2016-10-10 NOTE — Telephone Encounter (Signed)
Spoke to son about okay to have phone conference.

## 2016-10-10 NOTE — Telephone Encounter (Signed)
Called patient's son, [name], left him a vm to let him know that Dr. Havery Moros said that a phone conference would be okay when his dad follows up here on 11/29/16. Asked that he be available between 11-12:00 that day in order to accommodate this conference, that way will save him a trip from [place].

## 2016-10-17 DIAGNOSIS — H401132 Primary open-angle glaucoma, bilateral, moderate stage: Secondary | ICD-10-CM | POA: Diagnosis not present

## 2016-11-01 ENCOUNTER — Encounter: Payer: Self-pay | Admitting: Internal Medicine

## 2016-11-01 ENCOUNTER — Ambulatory Visit (INDEPENDENT_AMBULATORY_CARE_PROVIDER_SITE_OTHER): Payer: Medicare HMO | Admitting: Internal Medicine

## 2016-11-01 VITALS — BP 132/60 | HR 75 | Temp 99.1°F | Ht 70.0 in | Wt 166.6 lb

## 2016-11-01 DIAGNOSIS — I10 Essential (primary) hypertension: Secondary | ICD-10-CM | POA: Diagnosis not present

## 2016-11-01 DIAGNOSIS — J069 Acute upper respiratory infection, unspecified: Secondary | ICD-10-CM

## 2016-11-01 DIAGNOSIS — B9789 Other viral agents as the cause of diseases classified elsewhere: Secondary | ICD-10-CM

## 2016-11-01 MED ORDER — CARVEDILOL 25 MG PO TABS: 25.0000 mg | ORAL_TABLET | Freq: Two times a day (BID) | ORAL | 3 refills | Status: DC

## 2016-11-01 NOTE — Progress Notes (Signed)
Subjective:    Patient ID: [name], male    DOB: [birth year], 83 y.o.   MRN: [id number]  HPI  83 year old patient who has a history of essential hypertension, coronary artery disease and diabetes who presents with a 2 to three-day history of cough.  Cough is productive of yellow sputum.  No fever, wheezing or shortness of breath.  In general feels well  Lab Results  Component Value Date   HGBA1C 6.2 09/26/2016   Past Medical History:  Diagnosis Date  . Arthritis    "touch in my fingers" (09/10/2014)  . B12 deficiency anemia    "stopped taking the shots in ~ 06/2014" (09/10/2014)  . CAD (coronary artery disease)    a. H/o MI in 1998;  b. s/p CABG 2003. c. 10/2014 NSTEMI/Cath: LM 75, LAD 143m/d, D1 95, D2 50, LCX 100ost/p, OM1 80, OM2 80, RCA 100p/m, RPDA fills via L->L collats, LIMA->LAD ok, VG->dRCA 100, VG->OM1->OM2 99 prox to OM1 insertion->recanalated ->Tx with heparin,  VG->D1 100-->Med Rx.  . Cataract   . Chronic systolic CHF (congestive heart failure) (HCC)    a. EF 40-45% in 10/2014 (previously normal in 08/2014).  . CKD (chronic kidney disease), stage IV ([place])   . Diabetes 1.5, managed as type 1 ([place])   . History of stomach ulcers 2013  . Hyperlipidemia   . Hypertension   . Hypertensive heart disease   . Ischemic cardiomyopathy    a. 10/2014 Echo: EF 40-45%.  . Myocardial infarction ([place])   . PVD (peripheral vascular disease) ([place])   . Type II diabetes mellitus ([place])      Social History   Social History  . Marital status: Widowed    Spouse name: N/A  . Number of children: N/A  . Years of education: N/A   Occupational History  . Retired    Social History Main Topics  . Smoking status: Former Smoker    Packs/day: 0.50    Years: 20.00    Types: Cigarettes  . Smokeless tobacco: Never Used     Comment: "quit smoking cigarettes in the 1960s   . Alcohol use No  . Drug use: No  . Sexual activity: Not Currently   Other Topics Concern  . Not on file    Social History Narrative   Pt lives alone, family nearby.    Past Surgical History:  Procedure Laterality Date  . CARDIAC CATHETERIZATION  2003;   . CARDIAC CATHETERIZATION N/A 11/07/2014   Left Heart Cath; Belva Crome, MD;   . CATARACT EXTRACTION, BILATERAL Bilateral   . CHOLECYSTECTOMY N/A 05/20/2015   Procedure: LAPAROSCOPIC CHOLECYSTECTOMY;  Surgeon: Coralie Keens, MD;  Location: [place];  Service: General;  Laterality: N/A;  . COLONOSCOPY    . COLONOSCOPY N/A 05/12/2015   Procedure: COLONOSCOPY;  Surgeon: Milus Banister, MD;  Location: [place];  Service: Endoscopy;  Laterality: N/A;  . [address]   [name] 07/13/2010  . CORONARY ARTERY BYPASS GRAFT  2003   CABG X5  . ESOPHAGOGASTRODUODENOSCOPY  06/10/2011   Procedure: ESOPHAGOGASTRODUODENOSCOPY (EGD);  Surgeon: Ladene Artist, MD,FACG;  Location: [facility] ENDOSCOPY;  Service: Endoscopy;  Laterality: N/A;  . ESOPHAGOGASTRODUODENOSCOPY N/A 10/04/2016   Procedure: ESOPHAGOGASTRODUODENOSCOPY (EGD);  Surgeon: Manus Gunning, MD;  Location: [facility];  Service: Gastroenterology;  Laterality: N/A;    Family History  Problem Relation Age of Onset  . Stroke Mother   . Hypertension Mother   . Cancer Mother   . Diabetes Unknown  brothers and sisters  . Coronary artery disease Unknown   . Hypertension Sister   . Heart attack Sister   . Colon cancer Neg Hx     No Known Allergies  Current Outpatient Prescriptions on File Prior to Visit  Medication Sig Dispense Refill  . acetaminophen (TYLENOL) 500 MG tablet Take 500 mg by mouth every 8 (eight) hours as needed for mild pain or headache.    Marland Kitchen amLODipine (NORVASC) 10 MG tablet Take 1 tablet (10 mg total) by mouth daily. 90 tablet 3  . aspirin EC 81 MG tablet Take 81 mg by mouth at bedtime.    Marland Kitchen atorvastatin (LIPITOR) 80 MG tablet TAKE 1 TABLET DAILY AT 6 PM 90 tablet 3  . BD PEN NEEDLE NANO U/F 32G X 4 MM MISC USE TWO TIMES DAILY AS NEEDED. 180 each  4  . bisacodyl (DULCOLAX) 5 MG EC tablet Take 5 mg by mouth daily as needed for moderate constipation.    . cloNIDine (CATAPRES) 0.1 MG tablet TAKE 1 TABLET AT BEDTIME 90 tablet 3  . Ferrous Sulfate (IRON) 325 (65 Fe) MG TABS Take 1 tablet by mouth daily. 30 each 0  . furosemide (LASIX) 40 MG tablet TAKE 60 MG BY MOUTH IN THE AM & 40 MG BY MOUTH IN THE PM 225 tablet 3  . glucose blood (ACCU-CHEK AVIVA PLUS) test strip USE TO CHECK BLOOD SUGAR DAILY AND PRN 100 each 12  . insulin aspart (NOVOLOG FLEXPEN) 100 UNIT/ML FlexPen Inject 10 Units into the skin 3 (three) times daily with meals. (Patient taking differently: Inject 12 Units into the skin 3 (three) times daily with meals. ) 45 mL 3  . Insulin Glargine (LANTUS SOLOSTAR) 100 UNIT/ML Solostar Pen Inject 16 Units into the skin daily at 10 pm. 15 mL 3  . nitroGLYCERIN (NITROSTAT) 0.4 MG SL tablet Place 1 tablet (0.4 mg total) under the tongue every 5 (five) minutes as needed for chest pain (3 doses MAX). 25 tablet 3  . omeprazole (PRILOSEC) 20 MG capsule Take 1 capsule (20 mg total) by mouth 2 (two) times daily before a meal. 120 capsule 3  . pentoxifylline (TRENTAL) 400 MG CR tablet TAKE 1 TABLET TWICE DAILY  AT  10AM  AND  5PM 180 tablet 3  . polyethylene glycol (MIRALAX / GLYCOLAX) packet Take 17 g by mouth daily as needed for mild constipation or moderate constipation. 30 each 1  . Potassium Chloride ER 20 MEQ TBCR Take 20 mEq by mouth daily. 90 tablet 3  . timolol (TIMOPTIC) 0.5 % ophthalmic solution Place 1 drop into both eyes every morning.    . TRAVATAN Z 0.004 % SOLN ophthalmic solution Place 1 drop into both eyes every evening.    . hydrALAZINE (APRESOLINE) 25 MG tablet Take 1 tablet (25 mg total) by mouth 3 (three) times daily. 270 tablet 3  . isosorbide mononitrate (IMDUR) 60 MG 24 hr tablet Take 2 tablets (120 mg total) by mouth daily. (Patient taking differently: Take 60 mg by mouth 2 (two) times daily. ) 180 tablet 3   No current  facility-administered medications on file prior to visit.     BP 132/60 (BP Location: Left Arm, Patient Position: Sitting, Cuff Size: Normal)   Pulse 75   Temp 99.1 F (37.3 C) (Oral)   Ht 5\' 10"  (1.778 m)   Wt 166 lb 9.6 oz (75.6 kg)   SpO2 97%   BMI 23.90 kg/m      Review of  Systems  Constitutional: Positive for activity change. Negative for appetite change, chills, fatigue and fever.  HENT: Negative for congestion, dental problem, ear pain, hearing loss, sore throat, tinnitus, trouble swallowing and voice change.   Eyes: Negative for pain, discharge and visual disturbance.  Respiratory: Positive for cough. Negative for chest tightness, shortness of breath, wheezing and stridor.   Cardiovascular: Negative for chest pain, palpitations and leg swelling.  Gastrointestinal: Negative for abdominal distention, abdominal pain, blood in stool, constipation, diarrhea, nausea and vomiting.  Genitourinary: Negative for difficulty urinating, discharge, flank pain, genital sores, hematuria and urgency.  Musculoskeletal: Negative for arthralgias, back pain, gait problem, joint swelling, myalgias and neck stiffness.  Skin: Negative for rash.  Neurological: Negative for dizziness, syncope, speech difficulty, weakness, numbness and headaches.  Hematological: Negative for adenopathy. Does not bruise/bleed easily.  Psychiatric/Behavioral: Negative for behavioral problems and dysphoric mood. The patient is not nervous/anxious.        Objective:   Physical Exam  Constitutional: He is oriented to person, place, and time. He appears well-developed.  HENT:  Head: Normocephalic.  Right Ear: External ear normal.  Left Ear: External ear normal.  Eyes: Conjunctivae and EOM are normal.  Neck: Normal range of motion.  Cardiovascular: Normal rate and normal heart sounds.   Pulmonary/Chest: Breath sounds normal. No respiratory distress. He has no wheezes. He has no rales.  Status post sternotomy    Abdominal: Bowel sounds are normal.  Musculoskeletal: Normal range of motion. He exhibits no edema or tenderness.  Neurological: He is alert and oriented to person, place, and time.  Psychiatric: He has a normal mood and affect. His behavior is normal.          Assessment & Plan:   Viral URI with cough.  Will treat symptomatically Essential hypertension, stable Coronary artery disease  [name]

## 2016-11-01 NOTE — Patient Instructions (Addendum)
Acute bronchitis symptoms for less than 10 days are generally not helped by antibiotics.  Take over-the-counter expectorants and cough medications such as  Mucinex DM.  Call if there is no improvement in 5 to 7 days or if  you develop worsening cough, fever, or new symptoms, such as shortness of breath or chest pain.  Hydrate and Humidify  Drink enough water to keep your urine clear or pale yellow. Staying hydrated will help to thin your mucus.  Use a cool mist humidifier to keep the humidity level in your home above 50%.  Inhale steam for 10-15 minutes, 3-4 times a day or as told by your health care provider. You can do this in the bathroom while a hot shower is running.  Limit your exposure to cool or dry air. Rest  Rest as much as possible.   Call or return to clinic prn if these symptoms worsen or fail to improve as anticipated.

## 2016-11-02 ENCOUNTER — Ambulatory Visit: Payer: Medicare HMO

## 2016-11-14 ENCOUNTER — Encounter: Payer: Self-pay | Admitting: Internal Medicine

## 2016-11-14 ENCOUNTER — Ambulatory Visit (INDEPENDENT_AMBULATORY_CARE_PROVIDER_SITE_OTHER): Payer: PRIVATE HEALTH INSURANCE | Admitting: Internal Medicine

## 2016-11-14 VITALS — BP 130/68 | HR 59 | Temp 97.6°F | Ht 70.0 in | Wt 160.4 lb

## 2016-11-14 DIAGNOSIS — J069 Acute upper respiratory infection, unspecified: Secondary | ICD-10-CM

## 2016-11-14 DIAGNOSIS — K250 Acute gastric ulcer with hemorrhage: Secondary | ICD-10-CM | POA: Diagnosis not present

## 2016-11-14 DIAGNOSIS — B9789 Other viral agents as the cause of diseases classified elsewhere: Secondary | ICD-10-CM | POA: Diagnosis not present

## 2016-11-14 DIAGNOSIS — D500 Iron deficiency anemia secondary to blood loss (chronic): Secondary | ICD-10-CM | POA: Diagnosis not present

## 2016-11-14 DIAGNOSIS — Z794 Long term (current) use of insulin: Secondary | ICD-10-CM

## 2016-11-14 DIAGNOSIS — E0821 Diabetes mellitus due to underlying condition with diabetic nephropathy: Secondary | ICD-10-CM

## 2016-11-14 DIAGNOSIS — I10 Essential (primary) hypertension: Secondary | ICD-10-CM

## 2016-11-14 NOTE — Patient Instructions (Signed)
Acute bronchitis symptoms  are generally not helped by antibiotics.  Take over-the-counter expectorants and cough medications such as  Mucinex . Call if there is no improvement in 5 to 7 days or if  you develop worsening cough, fever, or new symptoms, such as shortness of breath or chest pain.  Call or return to clinic prn if these symptoms worsen or fail to improve as anticipated.

## 2016-11-14 NOTE — Progress Notes (Signed)
Subjective:    Patient ID: [name], male    DOB: 1932/01/12, 83 y.o.   MRN: [id number]  HPI  83 year old patient who is seen with a viral URI with cough.  On September 4.  He presents today complaining of general sense of unwellness.  He denies any fever or productive cough.  He generally feels unwell. Denies any chest pain or shortness of breath. He does have a history of anemia and gastric ulcer disease. He has diabetes as well as chronic kidney disease He has coronary artery disease and is status post CABG and chronic systolic heart failure  Past Medical History:  Diagnosis Date  . Arthritis    "touch in my fingers" (09/10/2014)  . B12 deficiency anemia    "stopped taking the shots in ~ 06/2014" (09/10/2014)  . CAD (coronary artery disease)    a. H/o MI in 1998;  b. s/p CABG 2003. c. 10/2014 NSTEMI/Cath: LM 75, LAD 16m/d, D1 95, D2 50, LCX 100ost/p, OM1 80, OM2 80, RCA 100p/m, RPDA fills via L->L collats, LIMA->LAD ok, VG->dRCA 100, VG->OM1->OM2 99 prox to OM1 insertion->recanalated ->Tx with heparin,  VG->D1 100-->Med Rx.  . Cataract   . Chronic systolic CHF (congestive heart failure) (HCC)    a. EF 40-45% in 10/2014 (previously normal in 08/2014).  . CKD (chronic kidney disease), stage IV ([place])   . Diabetes 1.5, managed as type 1 ([place])   . History of stomach ulcers 2013  . Hyperlipidemia   . Hypertension   . Hypertensive heart disease   . Ischemic cardiomyopathy    a. 10/2014 Echo: EF 40-45%.  . Myocardial infarction ([place])   . PVD (peripheral vascular disease) ([place])   . Type II diabetes mellitus ([place])      Social History   Social History  . Marital status: Widowed    Spouse name: N/A  . Number of children: N/A  . Years of education: N/A   Occupational History  . Retired    Social History Main Topics  . Smoking status: Former Smoker    Packs/day: 0.50    Years: 20.00    Types: Cigarettes  . Smokeless tobacco: Never Used     Comment: "quit smoking  cigarettes in the 1960s   . Alcohol use No  . Drug use: No  . Sexual activity: Not Currently   Other Topics Concern  . Not on file   Social History Narrative   Pt lives alone, family nearby.    Past Surgical History:  Procedure Laterality Date  . CARDIAC CATHETERIZATION  2003;   . CARDIAC CATHETERIZATION N/A 11/07/2014   Left Heart Cath; Belva Crome, MD;   . CATARACT EXTRACTION, BILATERAL Bilateral   . CHOLECYSTECTOMY N/A 05/20/2015   Procedure: LAPAROSCOPIC CHOLECYSTECTOMY;  Surgeon: Coralie Keens, MD;  Location: [place];  Service: General;  Laterality: N/A;  . COLONOSCOPY    . COLONOSCOPY N/A 05/12/2015   Procedure: COLONOSCOPY;  Surgeon: Milus Banister, MD;  Location: [place];  Service: Endoscopy;  Laterality: N/A;  . [place]   [name] 07/13/2010  . CORONARY ARTERY BYPASS GRAFT  2003   CABG X5  . ESOPHAGOGASTRODUODENOSCOPY  06/10/2011   Procedure: ESOPHAGOGASTRODUODENOSCOPY (EGD);  Surgeon: Ladene Artist, MD,FACG;  [facility] ENDOSCOPY;  Service: Endoscopy;  Laterality: N/A;  . ESOPHAGOGASTRODUODENOSCOPY N/A 10/04/2016   Procedure: ESOPHAGOGASTRODUODENOSCOPY (EGD);  Surgeon: Manus Gunning, MD;  Location: [facility];  Service: Gastroenterology;  Laterality: N/A;    Family History  Problem Relation Age of Onset  . Stroke Mother   . Hypertension Mother   . Cancer Mother   . Diabetes Unknown        brothers and sisters  . Coronary artery disease Unknown   . Hypertension Sister   . Heart attack Sister   . Colon cancer Neg Hx     No Known Allergies  Current Outpatient Prescriptions on File Prior to Visit  Medication Sig Dispense Refill  . acetaminophen (TYLENOL) 500 MG tablet Take 500 mg by mouth every 8 (eight) hours as needed for mild pain or headache.    Marland Kitchen amLODipine (NORVASC) 10 MG tablet Take 1 tablet (10 mg total) by mouth daily. 90 tablet 3  . aspirin EC 81 MG tablet Take 81 mg by mouth at bedtime.    Marland Kitchen atorvastatin (LIPITOR)  80 MG tablet TAKE 1 TABLET DAILY AT 6 PM 90 tablet 3  . BD PEN NEEDLE NANO U/F 32G X 4 MM MISC USE TWO TIMES DAILY AS NEEDED. 180 each 4  . bisacodyl (DULCOLAX) 5 MG EC tablet Take 5 mg by mouth daily as needed for moderate constipation.    . carvedilol (COREG) 25 MG tablet Take 1 tablet (25 mg total) by mouth 2 (two) times daily with a meal. 180 tablet 3  . cloNIDine (CATAPRES) 0.1 MG tablet TAKE 1 TABLET AT BEDTIME 90 tablet 3  . Ferrous Sulfate (IRON) 325 (65 Fe) MG TABS Take 1 tablet by mouth daily. 30 each 0  . furosemide (LASIX) 40 MG tablet TAKE 60 MG BY MOUTH IN THE AM & 40 MG BY MOUTH IN THE PM 225 tablet 3  . glucose blood (ACCU-CHEK AVIVA PLUS) test strip USE TO CHECK BLOOD SUGAR DAILY AND PRN 100 each 12  . insulin aspart (NOVOLOG FLEXPEN) 100 UNIT/ML FlexPen Inject 10 Units into the skin 3 (three) times daily with meals. (Patient taking differently: Inject 12 Units into the skin 3 (three) times daily with meals. ) 45 mL 3  . Insulin Glargine (LANTUS SOLOSTAR) 100 UNIT/ML Solostar Pen Inject 16 Units into the skin daily at 10 pm. 15 mL 3  . nitroGLYCERIN (NITROSTAT) 0.4 MG SL tablet Place 1 tablet (0.4 mg total) under the tongue every 5 (five) minutes as needed for chest pain (3 doses MAX). 25 tablet 3  . omeprazole (PRILOSEC) 20 MG capsule Take 1 capsule (20 mg total) by mouth 2 (two) times daily before a meal. 120 capsule 3  . pentoxifylline (TRENTAL) 400 MG CR tablet TAKE 1 TABLET TWICE DAILY  AT  10AM  AND  5PM 180 tablet 3  . polyethylene glycol (MIRALAX / GLYCOLAX) packet Take 17 g by mouth daily as needed for mild constipation or moderate constipation. 30 each 1  . Potassium Chloride ER 20 MEQ TBCR Take 20 mEq by mouth daily. 90 tablet 3  . timolol (TIMOPTIC) 0.5 % ophthalmic solution Place 1 drop into both eyes every morning.    . TRAVATAN Z 0.004 % SOLN ophthalmic solution Place 1 drop into both eyes every evening.    . hydrALAZINE (APRESOLINE) 25 MG tablet Take 1 tablet (25 mg  total) by mouth 3 (three) times daily. 270 tablet 3  . isosorbide mononitrate (IMDUR) 60 MG 24 hr tablet Take 2 tablets (120 mg total) by mouth daily. (Patient taking differently: Take 60 mg by mouth 2 (two) times daily. ) 180 tablet 3   No current facility-administered medications on file prior to visit.  BP 130/68 (BP Location: Left Arm, Patient Position: Sitting, Cuff Size: Normal)   Pulse (!) 59   Temp 97.6 F (36.4 C) (Oral)   Ht 5\' 10"  (1.778 m)   Wt 160 lb 6.4 oz (72.8 kg)   SpO2 99%   BMI 23.02 kg/m     Review of Systems  Constitutional: Positive for activity change, appetite change and fatigue. Negative for chills and fever.  HENT: Negative for congestion, dental problem, ear pain, hearing loss, sore throat, tinnitus, trouble swallowing and voice change.   Eyes: Negative for pain, discharge and visual disturbance.  Respiratory: Positive for cough. Negative for chest tightness, shortness of breath, wheezing and stridor.   Cardiovascular: Negative for chest pain, palpitations and leg swelling.  Gastrointestinal: Negative for abdominal distention, abdominal pain, blood in stool, constipation, diarrhea, nausea and vomiting.  Genitourinary: Negative for difficulty urinating, discharge, flank pain, genital sores, hematuria and urgency.  Musculoskeletal: Negative for arthralgias, back pain, gait problem, joint swelling, myalgias and neck stiffness.  Skin: Negative for rash.  Neurological: Positive for weakness. Negative for dizziness, syncope, speech difficulty, numbness and headaches.  Hematological: Negative for adenopathy. Does not bruise/bleed easily.  Psychiatric/Behavioral: Negative for behavioral problems and dysphoric mood. The patient is not nervous/anxious.        Objective:   Physical Exam  Constitutional: He is oriented to person, place, and time. He appears well-developed. No distress.  Blood pressure 130/70 Pulse 60 O2 saturation 99% Afebrile  HENT:  Head:  Normocephalic.  Right Ear: External ear normal.  Left Ear: External ear normal.  Eyes: Conjunctivae and EOM are normal.  Neck: Normal range of motion.  Cardiovascular: Normal rate and normal heart sounds.   Pulmonary/Chest: Breath sounds normal. No respiratory distress. He has no wheezes. He has no rales.  Abdominal: Bowel sounds are normal. He exhibits no distension. There is no tenderness.  Musculoskeletal: Normal range of motion. He exhibits no edema or tenderness.  Neurological: He is alert and oriented to person, place, and time.  Psychiatric: He has a normal mood and affect. His behavior is normal.          Assessment & Plan:   Weakness Status post recent viral URI History of gastric ulcer.  We'll check CBC and electrolytes History of chronic kidney disease History of diabetes   Lab Results  Component Value Date   HGBA1C 6.2 09/26/2016   Will treat symptomatically Check CBC Bmet  Patient report any new or worsening symptoms  [id number],PETER [name]

## 2016-11-15 ENCOUNTER — Other Ambulatory Visit: Payer: Self-pay | Admitting: Internal Medicine

## 2016-11-15 DIAGNOSIS — N184 Chronic kidney disease, stage 4 (severe): Secondary | ICD-10-CM

## 2016-11-15 LAB — BASIC METABOLIC PANEL
BUN: 67 mg/dL — AB (ref 6–23)
CHLORIDE: 100 meq/L (ref 96–112)
CO2: 26 mEq/L (ref 19–32)
CREATININE: 4.14 mg/dL — AB (ref 0.40–1.50)
Calcium: 9.6 mg/dL (ref 8.4–10.5)
GFR: 17.79 mL/min — ABNORMAL LOW (ref >60.00)
GLUCOSE: 104 mg/dL — AB (ref 70–99)
POTASSIUM: 6.4 meq/L — AB (ref 3.5–5.1)
Sodium: 137 mEq/L (ref 135–145)

## 2016-11-15 LAB — CBC WITH DIFFERENTIAL/PLATELET
BASOS PCT: 0.8 % (ref 0.0–3.0)
Basophils Absolute: 0.1 10*3/uL (ref 0.0–0.1)
EOS ABS: 0.3 10*3/uL (ref 0.0–0.7)
EOS PCT: 3.4 % (ref 0.0–5.0)
HCT: 35.2 % — ABNORMAL LOW (ref 39.0–52.0)
HEMOGLOBIN: 11.5 g/dL — AB (ref 13.0–17.0)
LYMPHS ABS: 0.8 10*3/uL (ref 0.7–4.0)
Lymphocytes Relative: 10 % — ABNORMAL LOW (ref 12.0–46.0)
MCHC: 32.8 g/dL (ref 30.0–36.0)
MCV: 89.1 fl (ref 78.0–100.0)
MONO ABS: 0.4 10*3/uL (ref 0.1–1.0)
Monocytes Relative: 4.8 % (ref 3.0–12.0)
NEUTROS ABS: 6.8 10*3/uL (ref 1.4–7.7)
NEUTROS PCT: 81 % — AB (ref 43.0–77.0)
PLATELETS: 450 10*3/uL — AB (ref 150.0–400.0)
RBC: 3.95 Mil/uL — ABNORMAL LOW (ref 4.22–5.81)
RDW: 19.2 % — AB (ref 11.5–15.5)
WBC: 8.4 10*3/uL (ref 4.0–10.5)

## 2016-11-17 ENCOUNTER — Other Ambulatory Visit (INDEPENDENT_AMBULATORY_CARE_PROVIDER_SITE_OTHER): Payer: Medicare HMO

## 2016-11-17 DIAGNOSIS — N184 Chronic kidney disease, stage 4 (severe): Secondary | ICD-10-CM | POA: Diagnosis not present

## 2016-11-17 LAB — BASIC METABOLIC PANEL
BUN: 53 mg/dL — ABNORMAL HIGH (ref 6–23)
CALCIUM: 9.1 mg/dL (ref 8.4–10.5)
CO2: 23 mEq/L (ref 19–32)
CREATININE: 3.51 mg/dL — AB (ref 0.40–1.50)
Chloride: 107 mEq/L (ref 96–112)
GFR: 21.53 mL/min — AB (ref >60.00)
GLUCOSE: 81 mg/dL (ref 70–99)
Potassium: 4.1 mEq/L (ref 3.5–5.1)
SODIUM: 138 meq/L (ref 135–145)

## 2016-11-21 DIAGNOSIS — N184 Chronic kidney disease, stage 4 (severe): Secondary | ICD-10-CM | POA: Diagnosis not present

## 2016-11-21 DIAGNOSIS — D631 Anemia in chronic kidney disease: Secondary | ICD-10-CM | POA: Diagnosis not present

## 2016-11-23 ENCOUNTER — Other Ambulatory Visit: Payer: Self-pay | Admitting: Interventional Cardiology

## 2016-11-23 ENCOUNTER — Other Ambulatory Visit: Payer: Self-pay | Admitting: Internal Medicine

## 2016-11-24 ENCOUNTER — Telehealth: Payer: Self-pay | Admitting: Internal Medicine

## 2016-11-24 NOTE — Telephone Encounter (Signed)
°  Pt aware Dr Raliegh Ip is out of the office today    Pt call to say Dr Raliegh Ip took him off the below med and is asking if he need to stay off the medicines  Pt would like a call back.    FUROSEMIDE POTASSIUM CHLORIDE

## 2016-11-24 NOTE — Telephone Encounter (Signed)
Please advise 

## 2016-11-25 NOTE — Telephone Encounter (Signed)
Okay to resume furosemide Do not resume potassium supplement

## 2016-11-25 NOTE — Telephone Encounter (Signed)
Spoke with pt and informed him Okay to resume furosemide Do not resume potassium supplement

## 2016-11-28 DIAGNOSIS — N2581 Secondary hyperparathyroidism of renal origin: Secondary | ICD-10-CM | POA: Diagnosis not present

## 2016-11-28 DIAGNOSIS — N184 Chronic kidney disease, stage 4 (severe): Secondary | ICD-10-CM | POA: Diagnosis not present

## 2016-11-28 DIAGNOSIS — I10 Essential (primary) hypertension: Secondary | ICD-10-CM | POA: Diagnosis not present

## 2016-11-28 DIAGNOSIS — D631 Anemia in chronic kidney disease: Secondary | ICD-10-CM | POA: Diagnosis not present

## 2016-11-28 DIAGNOSIS — E611 Iron deficiency: Secondary | ICD-10-CM | POA: Diagnosis not present

## 2016-11-28 DIAGNOSIS — Z23 Encounter for immunization: Secondary | ICD-10-CM | POA: Diagnosis not present

## 2016-11-29 ENCOUNTER — Ambulatory Visit (INDEPENDENT_AMBULATORY_CARE_PROVIDER_SITE_OTHER): Payer: Medicare HMO | Admitting: Gastroenterology

## 2016-11-29 ENCOUNTER — Encounter: Payer: Self-pay | Admitting: Gastroenterology

## 2016-11-29 VITALS — BP 124/60 | HR 63 | Ht 70.0 in | Wt 165.0 lb

## 2016-11-29 DIAGNOSIS — D649 Anemia, unspecified: Secondary | ICD-10-CM | POA: Diagnosis not present

## 2016-11-29 DIAGNOSIS — K2940 Chronic atrophic gastritis without bleeding: Secondary | ICD-10-CM

## 2016-11-29 DIAGNOSIS — K2270 Barrett's esophagus without dysplasia: Secondary | ICD-10-CM

## 2016-11-29 DIAGNOSIS — K558 Other vascular disorders of intestine: Secondary | ICD-10-CM | POA: Diagnosis not present

## 2016-11-29 DIAGNOSIS — K5520 Angiodysplasia of colon without hemorrhage: Secondary | ICD-10-CM

## 2016-11-29 NOTE — Patient Instructions (Addendum)
You will need a repeat CBC in three months. We will call you to remind you in January of 2019.  If you are age 81 or older, your body mass index should be between 23-30. Your Body mass index is 23.68 kg/m. If this is out of the aforementioned range listed, please consider follow up with your Primary Care Provider.  If you are age 65 or younger, your body mass index should be between 19-25. Your Body mass index is 23.68 kg/m. If this is out of the aformentioned range listed, please consider follow up with your Primary Care Provider.   Thank you.

## 2016-11-29 NOTE — Progress Notes (Signed)
HPI :  83 year old male, here for follow-up visit. He has a history of stage IV CKD, atrophic gastritis with B12 deficiency, iron deficiency with duodenal AVMs - multifactorial anemia.   Since his last visit started on omeprazole 20mg  once to twice daily. EGD 10/04/16 - multiple duodenal AVMs, atrophic stomach, small hiatal hernia, irregular z-line - biopsies showed marked atrophic gastritis with intestinal metaplasia. Short segment BE without dysplasia. Biopsies negative for BE. Microcytic anemia with low ferritin. hemeoccults negative. On oral iron at this time. B12 normal - Hgb has intervally improved and iron studies improved (labs from nephrology reviewed today)  No FH of gastric cancer. No FH of esophagus cancer. He is eating okay. No abdominal pains.   Endoscopic history: EGD 06/10/2011 - antral ulcer 28mm in size. Biopsies negative for H pylori. Colonoscopy - 05/12/2015 - during GI bleed, extensive diverticulosis / hemorrhoids, thought to have left sided colonic bleed Colonoscopy 04/2008 - 56mm polyp, segmental colitis of left colon - unclear if related to diverticulosis, biopsies nonspecific EGD 10/04/16 - multiple duodenal AVMs, atrophic stomach, small hiatal hernia, irregular z-line - biopsies showed marked atrophic gastritis with intestinal metaplasia. Short segment BE without dysplasia. Biopsies negative for BE.   Past Medical History:  Diagnosis Date  . Arthritis    "touch in my fingers" (09/10/2014)  . Atrophic gastritis   . AVM (arteriovenous malformation) of duodenum, acquired   . B12 deficiency anemia    "stopped taking the shots in ~ 06/2014" (09/10/2014)  . Barrett's esophagus   . CAD (coronary artery disease)    a. H/o MI in 1998;  b. s/p CABG 2003. c. 10/2014 NSTEMI/Cath: LM 75, LAD 180m/d, D1 95, D2 50, LCX 100ost/p, OM1 80, OM2 80, RCA 100p/m, RPDA fills via L->L collats, LIMA->LAD ok, VG->dRCA 100, VG->OM1->OM2 99 prox to OM1 insertion->recanalated ->Tx with heparin,  VG->D1  100-->Med Rx.  . Cataract   . Chronic systolic CHF (congestive heart failure) (HCC)    a. EF 40-45% in 10/2014 (previously normal in 08/2014).  . CKD (chronic kidney disease), stage IV ([place])   . Diabetes 1.5, managed as type 1 ([place])   . History of stomach ulcers 2013  . Hyperlipidemia   . Hypertension   . Hypertensive heart disease   . Ischemic cardiomyopathy    a. 10/2014 Echo: EF 40-45%.  . Myocardial infarction ([place])   . PVD (peripheral vascular disease) ([place])   . Type II diabetes mellitus ([place])      Past Surgical History:  Procedure Laterality Date  . CARDIAC CATHETERIZATION  2003;   . CARDIAC CATHETERIZATION N/A 11/07/2014   Left Heart Cath; Belva Crome, MD;   . CATARACT EXTRACTION, BILATERAL Bilateral   . CHOLECYSTECTOMY N/A 05/20/2015   Procedure: LAPAROSCOPIC CHOLECYSTECTOMY;  Surgeon: Coralie Keens, MD;  Location: [place];  Service: General;  Laterality: N/A;  . COLONOSCOPY    . COLONOSCOPY N/A 05/12/2015   Procedure: COLONOSCOPY;  Surgeon: Milus Banister, MD;  Location: [place];  Service: Endoscopy;  Laterality: N/A;  . [place]   [name] 07/13/2010  . CORONARY ARTERY BYPASS GRAFT  2003   CABG X5  . ESOPHAGOGASTRODUODENOSCOPY  06/10/2011   Procedure: ESOPHAGOGASTRODUODENOSCOPY (EGD);  Surgeon: Ladene Artist, MD,FACG;  [facility] ENDOSCOPY;  Service: Endoscopy;  Laterality: N/A;  . ESOPHAGOGASTRODUODENOSCOPY N/A 10/04/2016   Procedure: ESOPHAGOGASTRODUODENOSCOPY (EGD);  Surgeon: Manus Gunning, MD;  Location: [facility];  Service: Gastroenterology;  Laterality: N/A;   Family History  Problem Relation Age  of Onset  . Stroke Mother   . Hypertension Mother   . Cancer Mother   . Diabetes Unknown        brothers and sisters  . Coronary artery disease Unknown   . Hypertension Sister   . Heart attack Sister   . Colon cancer Neg Hx    Social History  Substance Use Topics  . Smoking status: Former Smoker    Packs/day: 0.50    Years:  20.00    Types: Cigarettes  . Smokeless tobacco: Never Used     Comment: "quit smoking cigarettes in the 1960s   . Alcohol use No   Current Outpatient Prescriptions  Medication Sig Dispense Refill  . acetaminophen (TYLENOL) 500 MG tablet Take 500 mg by mouth every 8 (eight) hours as needed for mild pain or headache.    Marland Kitchen amLODipine (NORVASC) 10 MG tablet Take 1 tablet (10 mg total) by mouth daily. 90 tablet 3  . aspirin EC 81 MG tablet Take 81 mg by mouth at bedtime.    Marland Kitchen atorvastatin (LIPITOR) 80 MG tablet TAKE 1 TABLET DAILY AT 6 PM 90 tablet 3  . BD PEN NEEDLE NANO U/F 32G X 4 MM MISC USE TWO TIMES DAILY AS NEEDED. 180 each 4  . bisacodyl (DULCOLAX) 5 MG EC tablet Take 5 mg by mouth daily as needed for moderate constipation.    . carvedilol (COREG) 25 MG tablet Take 1 tablet (25 mg total) by mouth 2 (two) times daily with a meal. 180 tablet 3  . cloNIDine (CATAPRES) 0.1 MG tablet TAKE 1 TABLET AT BEDTIME 90 tablet 3  . Ferrous Sulfate (IRON) 325 (65 Fe) MG TABS Take 1 tablet by mouth daily. 30 each 0  . glucose blood (ACCU-CHEK AVIVA PLUS) test strip USE TO CHECK BLOOD SUGAR DAILY AND PRN 100 each 12  . insulin aspart (NOVOLOG FLEXPEN) 100 UNIT/ML FlexPen Inject 10 Units into the skin 3 (three) times daily with meals. (Patient taking differently: Inject 12 Units into the skin 3 (three) times daily with meals. ) 45 mL 3  . Insulin Glargine (LANTUS SOLOSTAR) 100 UNIT/ML Solostar Pen Inject 16 Units into the skin daily at 10 pm. 15 mL 3  . nitroGLYCERIN (NITROSTAT) 0.4 MG SL tablet Place 1 tablet (0.4 mg total) under the tongue every 5 (five) minutes as needed for chest pain (3 doses MAX). 25 tablet 3  . omeprazole (PRILOSEC) 20 MG capsule Take 1 capsule (20 mg total) by mouth 2 (two) times daily before a meal. 120 capsule 3  . pentoxifylline (TRENTAL) 400 MG CR tablet TAKE 1 TABLET TWICE DAILY  AT  10AM  AND  5PM 180 tablet 3  . polyethylene glycol (MIRALAX / GLYCOLAX) packet Take 17 g by  mouth daily as needed for mild constipation or moderate constipation. 30 each 1  . timolol (TIMOPTIC) 0.5 % ophthalmic solution Place 1 drop into both eyes every morning.    . TRAVATAN Z 0.004 % SOLN ophthalmic solution Place 1 drop into both eyes every evening.    . hydrALAZINE (APRESOLINE) 25 MG tablet Take 1 tablet (25 mg total) by mouth 3 (three) times daily. 270 tablet 3  . isosorbide mononitrate (IMDUR) 60 MG 24 hr tablet Take 2 tablets (120 mg total) by mouth daily. (Patient taking differently: Take 60 mg by mouth 2 (two) times daily. ) 180 tablet 3   No current facility-administered medications for this visit.    No Known Allergies   Review of  Systems: All systems reviewed and negative except where noted in HPI.   CBC Latest Ref Rng & Units 11/14/2016 08/04/2016 07/18/2016  WBC 4.0 - 10.5 K/uL 8.4 5.4 -  Hemoglobin 13.0 - 17.0 g/dL 11.5(L) 10.7(L) 9.9(A)  Hematocrit 39.0 - 52.0 % 35.2(L) 32.8(L) -  Platelets 150.0 - 400.0 K/uL 450.0(H) 233.0 -    Lab Results  Component Value Date   CREATININE 3.51 (H) 11/17/2016   BUN 53 (H) 11/17/2016   NA 138 11/17/2016   K 4.1 11/17/2016   CL 107 11/17/2016   CO2 23 11/17/2016    Lab Results  Component Value Date   ALT 12 06/19/2015   AST 13 06/19/2015   ALKPHOS 77 06/19/2015   BILITOT 0.4 06/19/2015    Lab Results  Component Value Date   IRON 107 08/04/2016   TIBC 435 (H) 08/04/2016   FERRITIN 12.0 (L) 08/04/2016     Physical Exam: BP 124/60   Pulse 63   Ht 5\' 10"  (1.778 m)   Wt 165 lb (74.8 kg)   BMI 23.68 kg/m  Constitutional: Pleasant,well-developed, male in no acute distress. HEENT: Normocephalic and atraumatic. Conjunctivae are normal. No scleral icterus. Neck supple.  Cardiovascular: Normal rate, regular rhythm.  Pulmonary/chest: Effort normal and breath sounds normal. No wheezing, rales or rhonchi. Abdominal: Soft, nondistended, nontender. There are no masses palpable. No hepatomegaly. Extremities: no  edema Lymphadenopathy: No cervical adenopathy noted. Neurological: Alert and oriented to person place and time. Skin: Skin is warm and dry. No rashes noted. Psychiatric: Normal mood and affect. Behavior is normal.   ASSESSMENT AND PLAN: 83 year old male here for follow-up visit to discuss the following issues:  Anemia - multifactorial due to CKD, iron deficiency secondary to AVMs which were cauterized, atrophic gastritis - now on iron, hemoglobin is stable, iron studies improved, on B12 with normal iron studies. I will repeat another CBC in 3 months to ensure stable. Otherwise avoid NSAIDs  Atrophic gastritis / gastric intestinal metaplasia - he has marked atrophic gastritis, pernicious anemia. I discussed with him that this increases the risk for gastric cancer over time. He has no family history of stomach cancer. While the overall risk is a low for gastric cancer, there are no clear guidelines for surveillance in [place] for this issue. One guidelines suggests surveillance endoscopy every 3 years. I discussed this with him, at that point he'll be 83 years old, surveillance may likely not be warranted given his age. We can discuss it in 3 years.  Barrett's esophagus - short segment noted on EGD, he is on PPI and tolerating it well, he will continue this for now. Again as above, likely will not continue with surveillance given his age.   AVMs - ablated on EGD, will trend hemoglobin, continue iron, hopefully no further endoscopic therapy is warranted.   Preble Cellar, MD [facility] Gastroenterology Pager [phone]

## 2016-12-02 ENCOUNTER — Ambulatory Visit (INDEPENDENT_AMBULATORY_CARE_PROVIDER_SITE_OTHER): Payer: Medicare HMO

## 2016-12-02 DIAGNOSIS — E538 Deficiency of other specified B group vitamins: Secondary | ICD-10-CM

## 2016-12-02 MED ORDER — CYANOCOBALAMIN 1000 MCG/ML IJ SOLN
1000.0000 ug | Freq: Once | INTRAMUSCULAR | Status: AC
  Administered 2016-12-02: 1000 ug via INTRAMUSCULAR

## 2016-12-12 ENCOUNTER — Encounter: Payer: Self-pay | Admitting: Interventional Cardiology

## 2016-12-12 ENCOUNTER — Ambulatory Visit (INDEPENDENT_AMBULATORY_CARE_PROVIDER_SITE_OTHER): Payer: Medicare HMO | Admitting: Interventional Cardiology

## 2016-12-12 VITALS — BP 132/60 | HR 64 | Ht 70.0 in | Wt 166.4 lb

## 2016-12-12 DIAGNOSIS — N184 Chronic kidney disease, stage 4 (severe): Secondary | ICD-10-CM | POA: Diagnosis not present

## 2016-12-12 DIAGNOSIS — E785 Hyperlipidemia, unspecified: Secondary | ICD-10-CM

## 2016-12-12 DIAGNOSIS — I25709 Atherosclerosis of coronary artery bypass graft(s), unspecified, with unspecified angina pectoris: Secondary | ICD-10-CM

## 2016-12-12 DIAGNOSIS — E0821 Diabetes mellitus due to underlying condition with diabetic nephropathy: Secondary | ICD-10-CM | POA: Diagnosis not present

## 2016-12-12 DIAGNOSIS — Z794 Long term (current) use of insulin: Secondary | ICD-10-CM

## 2016-12-12 DIAGNOSIS — I5042 Chronic combined systolic (congestive) and diastolic (congestive) heart failure: Secondary | ICD-10-CM

## 2016-12-12 NOTE — Patient Instructions (Signed)
Medication Instructions:  Your physician recommends that you continue on your current medications as directed. Please refer to the Current Medication list given to you today.   Labwork: None  Testing/Procedures: None  Follow-Up: Your physician wants you to follow-up in: 6-9 months with Dr. Smith. You will receive a reminder letter in the mail two months in advance. If you don't receive a letter, please call our office to schedule the follow-up appointment.   Any Other Special Instructions Will Be Listed Below (If Applicable).     If you need a refill on your cardiac medications before your next appointment, please call your pharmacy.   

## 2016-12-12 NOTE — Progress Notes (Signed)
Cardiology Office Note    Date:  12/12/2016   ID:  [name], DOB 17-May-1931, MRN [id number]  PCP:  Brandon Lor, MD  Cardiologist: Brandon Grooms, MD   Chief Complaint  Patient presents with  . Follow-up    History of Present Illness:  [name] is a 83 y.o. male who presents for follow-up of CAD, bypass graft failure with only remaining conduit LIMA to LAD by cath in September 2016, chronic kidney disease stage IV, diabetes mellitus, chronic systolic heart failure with LVEF 40%, peripheral arterial disease, hypertension, and hyperlipidemia. Recent history of stomach ulcers with GI bleeding while on Plavix.  [name] noted some increasing shortness of breath and swelling. This occurred after furosemide was discontinued. These changes were made by primary care, Brandon. Raliegh Holt. He is now back on furosemide 60 mg in a.m. and 40 at night. Swelling and breathing have improved. He denies orthopnea. He has rare chest discomfort. He remains active. He has not had syncope and denies orthopnea. He recently saw Brandon. Joelyn Holt at [facility].   Past Medical History:  Diagnosis Date  . Arthritis    "touch in my fingers" (09/10/2014)  . Atrophic gastritis   . AVM (arteriovenous malformation) of duodenum, acquired   . B12 deficiency anemia    "stopped taking the shots in ~ 06/2014" (09/10/2014)  . Barrett's esophagus   . CAD (coronary artery disease)    a. H/o MI in 1998;  b. s/p CABG 2003. c. 10/2014 NSTEMI/Cath: LM 75, LAD 161m/d, D1 95, D2 50, LCX 100ost/p, OM1 80, OM2 80, RCA 100p/m, RPDA fills via L->L collats, LIMA->LAD ok, VG->dRCA 100, VG->OM1->OM2 99 prox to OM1 insertion->recanalated ->Tx with heparin,  VG->D1 100-->Med Rx.  . Cataract   . Chronic systolic CHF (congestive heart failure) (HCC)    a. EF 40-45% in 10/2014 (previously normal in 08/2014).  . CKD (chronic kidney disease), stage IV ([place])   . Diabetes 1.5, managed as type 1 ([place])   . History of  stomach ulcers 2013  . Hyperlipidemia   . Hypertension   . Hypertensive heart disease   . Ischemic cardiomyopathy    a. 10/2014 Echo: EF 40-45%.  . Myocardial infarction ([place])   . PVD (peripheral vascular disease) ([place])   . Type II diabetes mellitus ([place])     Past Surgical History:  Procedure Laterality Date  . CARDIAC CATHETERIZATION  2003;   . CARDIAC CATHETERIZATION N/A 11/07/2014   Left Heart Cath; Belva Crome, MD;   . CATARACT EXTRACTION, BILATERAL Bilateral   . CHOLECYSTECTOMY N/A 05/20/2015   Procedure: LAPAROSCOPIC CHOLECYSTECTOMY;  Surgeon: Coralie Keens, MD;  Location: [place];  Service: General;  Laterality: N/A;  . COLONOSCOPY    . COLONOSCOPY N/A 05/12/2015   Procedure: COLONOSCOPY;  Surgeon: Milus Banister, MD;  Location: [place];  Service: Endoscopy;  Laterality: N/A;  . [name]   [name] 07/13/2010  . CORONARY ARTERY BYPASS GRAFT  2003   CABG X5  . ESOPHAGOGASTRODUODENOSCOPY  06/10/2011   Procedure: ESOPHAGOGASTRODUODENOSCOPY (EGD);  Surgeon: Ladene Artist, MD,FACG;  Location: [facility] ENDOSCOPY;  Service: Endoscopy;  Laterality: N/A;  . ESOPHAGOGASTRODUODENOSCOPY N/A 10/04/2016   Procedure: ESOPHAGOGASTRODUODENOSCOPY (EGD);  Surgeon: Manus Gunning, MD;  Location[facility];  Service: Gastroenterology;  Laterality: N/A;    Current Medications: Outpatient Medications Prior to Visit  Medication Sig Dispense Refill  . acetaminophen (TYLENOL) 500 MG tablet Take 500 mg by mouth every 8 (eight)  hours as needed for mild pain or headache.    Marland Kitchen amLODipine (NORVASC) 10 MG tablet Take 1 tablet (10 mg total) by mouth daily. 90 tablet 3  . aspirin EC 81 MG tablet Take 81 mg by mouth at bedtime.    Marland Kitchen atorvastatin (LIPITOR) 80 MG tablet TAKE 1 TABLET DAILY AT 6 PM 90 tablet 3  . BD PEN NEEDLE NANO U/F 32G X 4 MM MISC USE TWO TIMES DAILY AS NEEDED. 180 each 4  . bisacodyl (DULCOLAX) 5 MG EC tablet Take 5 mg by mouth daily as needed for moderate  constipation.    . carvedilol (COREG) 25 MG tablet Take 1 tablet (25 mg total) by mouth 2 (two) times daily with a meal. 180 tablet 3  . cloNIDine (CATAPRES) 0.1 MG tablet TAKE 1 TABLET AT BEDTIME 90 tablet 3  . Ferrous Sulfate (IRON) 325 (65 Fe) MG TABS Take 1 tablet by mouth daily. 30 each 0  . glucose blood (ACCU-CHEK AVIVA PLUS) test strip USE TO CHECK BLOOD SUGAR DAILY AND PRN 100 each 12  . insulin aspart (NOVOLOG FLEXPEN) 100 UNIT/ML FlexPen Inject 10 Units into the skin 3 (three) times daily with meals. (Patient taking differently: Inject 12 Units into the skin 3 (three) times daily with meals. ) 45 mL 3  . Insulin Glargine (LANTUS SOLOSTAR) 100 UNIT/ML Solostar Pen Inject 16 Units into the skin daily at 10 pm. 15 mL 3  . isosorbide mononitrate (IMDUR) 60 MG 24 hr tablet Take 2 tablets (120 mg total) by mouth daily. (Patient taking differently: Take 60 mg by mouth 2 (two) times daily. ) 180 tablet 3  . nitroGLYCERIN (NITROSTAT) 0.4 MG SL tablet Place 1 tablet (0.4 mg total) under the tongue every 5 (five) minutes as needed for chest pain (3 doses MAX). 25 tablet 3  . omeprazole (PRILOSEC) 20 MG capsule Take 1 capsule (20 mg total) by mouth 2 (two) times daily before a meal. 120 capsule 3  . pentoxifylline (TRENTAL) 400 MG CR tablet TAKE 1 TABLET TWICE DAILY  AT  10AM  AND  5PM 180 tablet 3  . polyethylene glycol (MIRALAX / GLYCOLAX) packet Take 17 g by mouth daily as needed for mild constipation or moderate constipation. 30 each 1  . timolol (TIMOPTIC) 0.5 % ophthalmic solution Place 1 drop into both eyes every morning.    . TRAVATAN Z 0.004 % SOLN ophthalmic solution Place 1 drop into both eyes every evening.    . hydrALAZINE (APRESOLINE) 25 MG tablet Take 1 tablet (25 mg total) by mouth 3 (three) times daily. 270 tablet 3   No facility-administered medications prior to visit.      Allergies:   Patient has no known allergies.   Social History   Social History  . Marital status:  Widowed    Spouse name: N/A  . Number of children: N/A  . Years of education: N/A   Occupational History  . Retired    Social History Main Topics  . Smoking status: Former Smoker    Packs/day: 0.50    Years: 20.00    Types: Cigarettes  . Smokeless tobacco: Never Used     Comment: "quit smoking cigarettes in the 1960s   . Alcohol use No  . Drug use: No  . Sexual activity: Not Currently   Other Topics Concern  . None   Social History Narrative   Pt lives alone, family nearby.     Family History:  The patient's family history  includes Cancer in his mother; Coronary artery disease in his unknown relative; Diabetes in his unknown relative; Heart attack in his sister; Hypertension in his mother and sister; Stroke in his mother.   ROS:   Please see the history of present illness.    No complaints. We reconciled the patient's diuretic regimen based upon the notes sent by [facility].  All other systems reviewed and are negative.   PHYSICAL EXAM:   VS:  BP 132/60   Pulse 64   Ht 5\' 10"  (1.778 m)   Wt 166 lb 6.4 oz (75.5 kg)   SpO2 97%   BMI 23.88 kg/m    GEN: Well nourished, well developed, in no acute distress  HEENT: normal  Neck: no JVD, carotid bruits, or masses Cardiac: RRR; no murmurs, rubs, or gallops,no edema  Respiratory:  clear to auscultation bilaterally, normal work of breathing GI: soft, nontender, nondistended, + BS MS: no deformity or atrophy  Skin: warm and dry, no rash Neuro:  Alert and Oriented x 3, Strength and sensation are intact Psych: euthymic mood, full affect  Wt Readings from Last 3 Encounters:  12/12/16 166 lb 6.4 oz (75.5 kg)  11/29/16 165 lb (74.8 kg)  11/14/16 160 lb 6.4 oz (72.8 kg)      Studies/Labs Reviewed:   EKG:  EKG  Not performed  Recent Labs: 11/14/2016: Hemoglobin 11.5; Platelets 450.0 11/17/2016: BUN 53; Creatinine, Ser 3.51; Potassium 4.1; Sodium 138   Lipid Panel    Component Value Date/Time   CHOL  81 05/17/2015 2329   TRIG 111 05/17/2015 2329   HDL 22 (L) 05/17/2015 2329   CHOLHDL 3.7 05/17/2015 2329   VLDL 22 05/17/2015 2329   LDLCALC 37 05/17/2015 2329   LDLDIRECT 93.3 01/22/2013 0854    Additional studies/ records that were reviewed today include:  Stable kidney function with creatinine of 3.59/20/2018 potassium was 4.1. LDL cholesterol 37.    ASSESSMENT:    1. Coronary artery disease involving 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 ([place])   2. Systolic and diastolic CHF, chronic ([place])   3. Diabetes mellitus due to underlying condition with diabetic nephropathy, with long-term current use of insulin ([place])   4. CKD (chronic kidney disease), stage IV ([place])   5. Dyslipidemia      PLAN:  In order of problems listed above:  1. Stable with known saphenous vein graft and native vessel occlusion and only remaining vessel LIMA to LAD. 2. Stable without current volume overload but with diuretic requirement. Off diuretic therapy he develops volume overload and dyspnea. 3. Followed by primary and nephrology 4. Same. As noted above patient is on furosemide 60 mg a.m. and 40 mg p.m. 5. Better than target. Continue aggressive therapy.  6-9 month follow-up.    Medication Adjustments/Labs and Tests Ordered: Current medicines are reviewed at length with the patient today.  Concerns regarding medicines are outlined above.  Medication changes, Labs and Tests ordered today are listed in the Patient Instructions below. Patient Instructions  Medication Instructions:  Your physician recommends that you continue on your current medications as directed. Please refer to the Current Medication list given to you today.  Labwork: None  Testing/Procedures: None  Follow-Up: Your physician wants you to follow-up in: 6-9 months with Brandon. Tamala Julian. You will receive a reminder letter in the mail two months in advance. If you don't receive a letter, please call our office to  schedule the follow-up appointment.   Any Other Special  Instructions Will Be Listed Below (If Applicable).     If you need a refill on your cardiac medications before your next appointment, please call your pharmacy.      Signed, Brandon Grooms, MD  12/12/2016 10:28 AM    [facility] [address], [place], [place]  [address] Phone: [phone]; Fax: [phone]

## 2016-12-26 ENCOUNTER — Other Ambulatory Visit: Payer: Self-pay | Admitting: Internal Medicine

## 2016-12-26 ENCOUNTER — Other Ambulatory Visit: Payer: Self-pay | Admitting: Interventional Cardiology

## 2016-12-28 ENCOUNTER — Encounter: Payer: Self-pay | Admitting: Internal Medicine

## 2016-12-28 ENCOUNTER — Ambulatory Visit (INDEPENDENT_AMBULATORY_CARE_PROVIDER_SITE_OTHER): Payer: Medicare HMO | Admitting: Internal Medicine

## 2016-12-28 VITALS — BP 118/62 | HR 63 | Temp 98.0°F | Ht 70.0 in | Wt 167.6 lb

## 2016-12-28 DIAGNOSIS — I10 Essential (primary) hypertension: Secondary | ICD-10-CM

## 2016-12-28 DIAGNOSIS — N184 Chronic kidney disease, stage 4 (severe): Secondary | ICD-10-CM | POA: Diagnosis not present

## 2016-12-28 DIAGNOSIS — N189 Chronic kidney disease, unspecified: Secondary | ICD-10-CM

## 2016-12-28 DIAGNOSIS — E1122 Type 2 diabetes mellitus with diabetic chronic kidney disease: Secondary | ICD-10-CM

## 2016-12-28 DIAGNOSIS — E0821 Diabetes mellitus due to underlying condition with diabetic nephropathy: Secondary | ICD-10-CM

## 2016-12-28 DIAGNOSIS — I2510 Atherosclerotic heart disease of native coronary artery without angina pectoris: Secondary | ICD-10-CM | POA: Diagnosis not present

## 2016-12-28 DIAGNOSIS — Z794 Long term (current) use of insulin: Secondary | ICD-10-CM

## 2016-12-28 LAB — HEMOGLOBIN A1C: HEMOGLOBIN A1C: 6.7 % — AB (ref 4.6–6.5)

## 2016-12-28 NOTE — Patient Instructions (Signed)
Limit your sodium (Salt) intake   Please check your hemoglobin A1c every 3-6  Months    It is important that you exercise regularly, at least 20 minutes 3 to 4 times per week.  If you develop chest pain or shortness of breath seek  medical attention.

## 2016-12-28 NOTE — Progress Notes (Signed)
Subjective:    Patient ID: [name], male    DOB: April 14, 1931, 83 y.o.   MRN: [id number]  HPI  83 year old patient who is seen today for follow-up of diabetes.  He remains on basal bolus insulin and continues to do quite well.  Blood sugars have been well controlled and there has been no documented hypoglycemia.  Lab Results  Component Value Date   HGBA1C 6.2 09/26/2016   He has been evaluated recently by cardiology, nephrology and GI  Doing well today  Past Medical History:  Diagnosis Date  . Arthritis    "touch in my fingers" (09/10/2014)  . Atrophic gastritis   . AVM (arteriovenous malformation) of duodenum, acquired   . B12 deficiency anemia    "stopped taking the shots in ~ 06/2014" (09/10/2014)  . Barrett's esophagus   . CAD (coronary artery disease)    a. H/o MI in 1998;  b. s/p CABG 2003. c. 10/2014 NSTEMI/Cath: LM 75, LAD 132m/d, D1 95, D2 50, LCX 100ost/p, OM1 80, OM2 80, RCA 100p/m, RPDA fills via L->L collats, LIMA->LAD ok, VG->dRCA 100, VG->OM1->OM2 99 prox to OM1 insertion->recanalated ->Tx with heparin,  VG->D1 100-->Med Rx.  . Cataract   . Chronic systolic CHF (congestive heart failure) (HCC)    a. EF 40-45% in 10/2014 (previously normal in 08/2014).  . CKD (chronic kidney disease), stage IV ([place])   . Diabetes 1.5, managed as type 1 ([place])   . History of stomach ulcers 2013  . Hyperlipidemia   . Hypertension   . Hypertensive heart disease   . Ischemic cardiomyopathy    a. 10/2014 Echo: EF 40-45%.  . Myocardial infarction ([place])   . PVD (peripheral vascular disease) ([place])   . Type II diabetes mellitus ([place])      Social History   Social History  . Marital status: Widowed    Spouse name: N/A  . Number of children: N/A  . Years of education: N/A   Occupational History  . Retired    Social History Main Topics  . Smoking status: Former Smoker    Packs/day: 0.50    Years: 20.00    Types: Cigarettes  . Smokeless tobacco: Never Used     Comment:  "quit smoking cigarettes in the 1960s   . Alcohol use No  . Drug use: No  . Sexual activity: Not Currently   Other Topics Concern  . Not on file   Social History Narrative   Pt lives alone, family nearby.    Past Surgical History:  Procedure Laterality Date  . CARDIAC CATHETERIZATION  2003;   . CARDIAC CATHETERIZATION N/A 11/07/2014   Left Heart Cath; Belva Crome, MD;   . CATARACT EXTRACTION, BILATERAL Bilateral   . CHOLECYSTECTOMY N/A 05/20/2015   Procedure: LAPAROSCOPIC CHOLECYSTECTOMY;  Surgeon: Coralie Keens, MD;  Location: [facility];  Service: General;  Laterality: N/A;  . COLONOSCOPY    . COLONOSCOPY N/A 05/12/2015   Procedure: COLONOSCOPY;  Surgeon: Milus Banister, MD;  Location: [facility];  Service: Endoscopy;  Laterality: N/A;  . [place]   [name] 07/13/2010  . CORONARY ARTERY BYPASS GRAFT  2003   CABG X5  . ESOPHAGOGASTRODUODENOSCOPY  06/10/2011   Procedure: ESOPHAGOGASTRODUODENOSCOPY (EGD);  Surgeon: Ladene Artist, MD,FACG;  Location[facility] ENDOSCOPY;  Service: Endoscopy;  Laterality: N/A;  . ESOPHAGOGASTRODUODENOSCOPY N/A 10/04/2016   Procedure: ESOPHAGOGASTRODUODENOSCOPY (EGD);  Surgeon: Manus Gunning, MD;  Location: [facility];  Service: Gastroenterology;  Laterality: N/A;  Family History  Problem Relation Age of Onset  . Stroke Mother   . Hypertension Mother   . Cancer Mother   . Diabetes Unknown        brothers and sisters  . Coronary artery disease Unknown   . Hypertension Sister   . Heart attack Sister   . Colon cancer Neg Hx     No Known Allergies  Current Outpatient Prescriptions on File Prior to Visit  Medication Sig Dispense Refill  . acetaminophen (TYLENOL) 500 MG tablet Take 500 mg by mouth every 8 (eight) hours as needed for mild pain or headache.    Marland Kitchen amLODipine (NORVASC) 10 MG tablet TAKE 1 TABLET EVERY DAY 90 tablet 3  . aspirin EC 81 MG tablet Take 81 mg by mouth at bedtime.    Marland Kitchen atorvastatin (LIPITOR) 80 MG  tablet TAKE 1 TABLET DAILY AT 6 PM 90 tablet 3  . BD PEN NEEDLE NANO U/F 32G X 4 MM MISC USE TWO TIMES DAILY AS NEEDED. 180 each 4  . bisacodyl (DULCOLAX) 5 MG EC tablet Take 5 mg by mouth daily as needed for moderate constipation.    . carvedilol (COREG) 25 MG tablet Take 1 tablet (25 mg total) by mouth 2 (two) times daily with a meal. 180 tablet 3  . cloNIDine (CATAPRES) 0.1 MG tablet TAKE 1 TABLET AT BEDTIME 90 tablet 3  . Ferrous Sulfate (IRON) 325 (65 Fe) MG TABS Take 1 tablet by mouth daily. 30 each 0  . furosemide (LASIX) 40 MG tablet Take 1.5 tablets (60 mg) by mouth each morning and one (1) tablet (40 mg) by mouth each evening. 225 tablet 3  . glucose blood (ACCU-CHEK AVIVA PLUS) test strip USE TO CHECK BLOOD SUGAR DAILY AND PRN 100 each 12  . insulin aspart (NOVOLOG FLEXPEN) 100 UNIT/ML FlexPen Inject 10 Units into the skin 3 (three) times daily with meals. (Patient taking differently: Inject 12 Units into the skin 3 (three) times daily with meals. ) 45 mL 3  . Insulin Glargine (LANTUS SOLOSTAR) 100 UNIT/ML Solostar Pen Inject 16 Units into the skin daily at 10 pm. 15 mL 3  . nitroGLYCERIN (NITROSTAT) 0.4 MG SL tablet Place 1 tablet (0.4 mg total) under the tongue every 5 (five) minutes as needed for chest pain (3 doses MAX). 25 tablet 3  . omeprazole (PRILOSEC) 20 MG capsule Take 1 capsule (20 mg total) by mouth 2 (two) times daily before a meal. 120 capsule 3  . pentoxifylline (TRENTAL) 400 MG CR tablet TAKE 1 TABLET TWICE DAILY  AT  10AM  AND  5PM 180 tablet 3  . polyethylene glycol (MIRALAX / GLYCOLAX) packet Take 17 g by mouth daily as needed for mild constipation or moderate constipation. 30 each 1  . timolol (TIMOPTIC) 0.5 % ophthalmic solution Place 1 drop into both eyes every morning.    . TRAVATAN Z 0.004 % SOLN ophthalmic solution Place 1 drop into both eyes every evening.    . hydrALAZINE (APRESOLINE) 25 MG tablet Take 1 tablet (25 mg total) by mouth 3 (three) times daily. 270  tablet 3  . isosorbide mononitrate (IMDUR) 60 MG 24 hr tablet Take 2 tablets (120 mg total) by mouth daily. (Patient taking differently: Take 60 mg by mouth 2 (two) times daily. ) 180 tablet 3   No current facility-administered medications on file prior to visit.     BP 118/62 (BP Location: Left Arm, Patient Position: Sitting, Cuff Size: Normal)   Pulse  63   Temp 98 F (36.7 C) (Oral)   Ht 5\' 10"  (1.778 m)   Wt 167 lb 9.6 oz (76 kg)   SpO2 98%   BMI 24.05 kg/m     Review of Systems  Constitutional: Negative for appetite change, chills, fatigue and fever.  HENT: Negative for congestion, dental problem, ear pain, hearing loss, sore throat, tinnitus, trouble swallowing and voice change.   Eyes: Negative for pain, discharge and visual disturbance.  Respiratory: Negative for cough, chest tightness, wheezing and stridor.   Cardiovascular: Negative for chest pain, palpitations and leg swelling.  Gastrointestinal: Negative for abdominal distention, abdominal pain, blood in stool, constipation, diarrhea, nausea and vomiting.  Genitourinary: Negative for difficulty urinating, discharge, flank pain, genital sores, hematuria and urgency.  Musculoskeletal: Negative for arthralgias, back pain, gait problem, joint swelling, myalgias and neck stiffness.  Skin: Negative for rash.  Neurological: Negative for dizziness, syncope, speech difficulty, weakness, numbness and headaches.  Hematological: Negative for adenopathy. Does not bruise/bleed easily.  Psychiatric/Behavioral: Negative for behavioral problems and dysphoric mood. The patient is not nervous/anxious.        Objective:   Physical Exam  Constitutional: He is oriented to person, place, and time. He appears well-developed.  Blood pressure 118/60  HENT:  Head: Normocephalic.  Right Ear: External ear normal.  Left Ear: External ear normal.  Eyes: Conjunctivae and EOM are normal.  Neck: Normal range of motion.  Cardiovascular: Normal  rate and normal heart sounds.   Pulmonary/Chest: Breath sounds normal.  Abdominal: Bowel sounds are normal.  Musculoskeletal: Normal range of motion. He exhibits no edema or tenderness.  Neurological: He is alert and oriented to person, place, and time.  Psychiatric: He has a normal mood and affect. His behavior is normal.          Assessment & Plan:   Diabetes mellitus.  Will review a hemoglobin A1c.  No change in medical regimen Chronic kidney disease, stable Coronary artery disease  Flu vaccine received last month Check hemoglobin A1c  Follow-up 4 months or as needed  [place]

## 2017-01-02 ENCOUNTER — Ambulatory Visit: Payer: Medicare HMO

## 2017-01-02 ENCOUNTER — Ambulatory Visit (INDEPENDENT_AMBULATORY_CARE_PROVIDER_SITE_OTHER): Payer: Medicare HMO | Admitting: *Deleted

## 2017-01-02 DIAGNOSIS — E538 Deficiency of other specified B group vitamins: Secondary | ICD-10-CM | POA: Diagnosis not present

## 2017-01-02 MED ORDER — CYANOCOBALAMIN 1000 MCG/ML IJ SOLN
1000.0000 ug | Freq: Once | INTRAMUSCULAR | Status: AC
  Administered 2017-01-02: 1000 ug via INTRAMUSCULAR

## 2017-01-02 NOTE — Progress Notes (Signed)
Per orders of Dr. Kwiatkowski, injection of B12 given by Seaira Byus J Alphonso Gregson. Patient tolerated injection well. 

## 2017-01-14 DIAGNOSIS — M79674 Pain in right toe(s): Secondary | ICD-10-CM | POA: Diagnosis not present

## 2017-01-16 ENCOUNTER — Other Ambulatory Visit: Payer: Self-pay | Admitting: Internal Medicine

## 2017-01-16 ENCOUNTER — Ambulatory Visit: Payer: Medicare HMO | Admitting: Adult Health

## 2017-01-16 ENCOUNTER — Encounter: Payer: Self-pay | Admitting: Adult Health

## 2017-01-16 VITALS — BP 142/54 | Temp 98.2°F | Wt 169.0 lb

## 2017-01-16 DIAGNOSIS — M109 Gout, unspecified: Secondary | ICD-10-CM

## 2017-01-16 LAB — CBC WITH DIFFERENTIAL/PLATELET
Basophils Absolute: 0 10*3/uL (ref 0.0–0.1)
Basophils Relative: 0.5 % (ref 0.0–3.0)
EOS ABS: 0.2 10*3/uL (ref 0.0–0.7)
Eosinophils Relative: 3.6 % (ref 0.0–5.0)
HCT: 35.4 % — ABNORMAL LOW (ref 39.0–52.0)
HEMOGLOBIN: 11.7 g/dL — AB (ref 13.0–17.0)
Lymphocytes Relative: 11 % — ABNORMAL LOW (ref 12.0–46.0)
Lymphs Abs: 0.7 10*3/uL (ref 0.7–4.0)
MCHC: 32.9 g/dL (ref 30.0–36.0)
MCV: 92.9 fl (ref 78.0–100.0)
MONO ABS: 0.4 10*3/uL (ref 0.1–1.0)
Monocytes Relative: 6.4 % (ref 3.0–12.0)
Neutro Abs: 5.3 10*3/uL (ref 1.4–7.7)
Neutrophils Relative %: 78.5 % — ABNORMAL HIGH (ref 43.0–77.0)
Platelets: 211 10*3/uL (ref 150.0–400.0)
RBC: 3.81 Mil/uL — AB (ref 4.22–5.81)
RDW: 16.6 % — ABNORMAL HIGH (ref 11.5–15.5)
WBC: 6.8 10*3/uL (ref 4.0–10.5)

## 2017-01-16 LAB — URIC ACID: URIC ACID, SERUM: 11.2 mg/dL — AB (ref 4.0–7.8)

## 2017-01-16 MED ORDER — PREDNISONE 20 MG PO TABS: 20.0000 mg | ORAL_TABLET | Freq: Every day | ORAL | 0 refills | Status: DC

## 2017-01-16 NOTE — Patient Instructions (Addendum)

## 2017-01-16 NOTE — Progress Notes (Signed)
Subjective:    Patient ID: [name], male    DOB: 08/13/1931, 83 y.o.   MRN: [id number]  HPI  83 year old male who  has a past medical history of Arthritis, Atrophic gastritis, AVM (arteriovenous malformation) of duodenum, acquired, B12 deficiency anemia, Barrett's esophagus, CAD (coronary artery disease), Cataract, Chronic systolic CHF (congestive heart failure) ([place]), CKD (chronic kidney disease), stage IV ([place]), Diabetes 1.5, managed as type 1 ([place]), History of stomach ulcers (2013), Hyperlipidemia, Hypertension, Hypertensive heart disease, Ischemic cardiomyopathy, Myocardial infarction [facility]), PVD (peripheral vascular disease) ([place]), and Type II diabetes mellitus ([place]).   He is a patient of Dr. Raliegh Ip, who I am seeing today for an acute issue of pain in his right great toe. He reports that he has had pain in his right great toe x 2-3 days. Pain is worse with movement and walking. He does endorse swelling, redness, and warmth.    Review of Systems See HPI   Past Medical History:  Diagnosis Date  . Arthritis    "touch in my fingers" (09/10/2014)  . Atrophic gastritis   . AVM (arteriovenous malformation) of duodenum, acquired   . B12 deficiency anemia    "stopped taking the shots in ~ 06/2014" (09/10/2014)  . Barrett's esophagus   . CAD (coronary artery disease)    a. H/o MI in 1998;  b. s/p CABG 2003. c. 10/2014 NSTEMI/Cath: LM 75, LAD 138m/d, D1 95, D2 50, LCX 100ost/p, OM1 80, OM2 80, RCA 100p/m, RPDA fills via L->L collats, LIMA->LAD ok, VG->dRCA 100, VG->OM1->OM2 99 prox to OM1 insertion->recanalated ->Tx with heparin,  VG->D1 100-->Med Rx.  . Cataract   . Chronic systolic CHF (congestive heart failure) (HCC)    a. EF 40-45% in 10/2014 (previously normal in 08/2014).  . CKD (chronic kidney disease), stage IV ([place])   . Diabetes 1.5, managed as type 1 ([place])   . History of stomach ulcers 2013  . Hyperlipidemia   . Hypertension   . Hypertensive heart disease   . Ischemic  cardiomyopathy    a. 10/2014 Echo: EF 40-45%.  . Myocardial infarction ([place])   . PVD (peripheral vascular disease) ([place])   . Type II diabetes mellitus ([place])     Social History   Socioeconomic History  . Marital status: Widowed    Spouse name: Not on file  . Number of children: Not on file  . Years of education: Not on file  . Highest education level: Not on file  Social Needs  . Financial resource strain: Not on file  . Food insecurity - worry: Not on file  . Food insecurity - inability: Not on file  . Transportation needs - medical: Not on file  . Transportation needs - non-medical: Not on file  Occupational History  . Occupation: Retired  Tobacco Use  . Smoking status: Former Smoker    Packs/day: 0.50    Years: 20.00    Pack years: 10.00    Types: Cigarettes  . Smokeless tobacco: Never Used  . Tobacco comment: "quit smoking cigarettes in the 1960s   Substance and Sexual Activity  . Alcohol use: No  . Drug use: No  . Sexual activity: Not Currently  Other Topics Concern  . Not on file  Social History Narrative   Pt lives alone, family nearby.    Past Surgical History:  Procedure Laterality Date  . CARDIAC CATHETERIZATION  2003;   . CATARACT EXTRACTION, BILATERAL Bilateral   . COLONOSCOPY    . COLONOSCOPY  N/A 05/12/2015   Performed by Milus Banister, MD at [facility] ENDOSCOPY  . CORONARY ANGIOPLASTY  1998   [name] 07/13/2010  . CORONARY ARTERY BYPASS GRAFT  2003   CABG X5  . ESOPHAGOGASTRODUODENOSCOPY (EGD) N/A 10/04/2016   Performed by Manus Gunning, MD at [facility]  . ESOPHAGOGASTRODUODENOSCOPY (EGD) N/A 06/10/2011   Performed by Ladene Artist, MD,FACG at [facility] ENDOSCOPY  . LAPAROSCOPIC CHOLECYSTECTOMY N/A 05/20/2015   Performed by Coralie Keens, MD at [facility]  . Left Heart Cath and Coronary Angiography N/A 11/07/2014   Performed by Belva Crome, MD at [facility] INVASIVE CV LAB    Family History  Problem Relation Age of Onset  . Stroke Mother   .  Hypertension Mother   . Cancer Mother   . Diabetes Unknown        brothers and sisters  . Coronary artery disease Unknown   . Hypertension Sister   . Heart attack Sister   . Colon cancer Neg Hx     No Known Allergies  Current Outpatient Medications on File Prior to Visit  Medication Sig Dispense Refill  . acetaminophen (TYLENOL) 500 MG tablet Take 500 mg by mouth every 8 (eight) hours as needed for mild pain or headache.    Marland Kitchen amLODipine (NORVASC) 10 MG tablet TAKE 1 TABLET EVERY DAY 90 tablet 3  . aspirin EC 81 MG tablet Take 81 mg by mouth at bedtime.    Marland Kitchen atorvastatin (LIPITOR) 80 MG tablet TAKE 1 TABLET DAILY AT 6 PM 90 tablet 3  . BD PEN NEEDLE NANO U/F 32G X 4 MM MISC USE TWO TIMES DAILY AS NEEDED. 180 each 4  . bisacodyl (DULCOLAX) 5 MG EC tablet Take 5 mg by mouth daily as needed for moderate constipation.    . carvedilol (COREG) 25 MG tablet Take 1 tablet (25 mg total) by mouth 2 (two) times daily with a meal. 180 tablet 3  . cloNIDine (CATAPRES) 0.1 MG tablet TAKE 1 TABLET AT BEDTIME 90 tablet 3  . Ferrous Sulfate (IRON) 325 (65 Fe) MG TABS Take 1 tablet by mouth daily. 30 each 0  . furosemide (LASIX) 40 MG tablet Take 1.5 tablets (60 mg) by mouth each morning and one (1) tablet (40 mg) by mouth each evening. 225 tablet 3  . glucose blood (ACCU-CHEK AVIVA PLUS) test strip USE TO CHECK BLOOD SUGAR DAILY AND PRN 100 each 12  . hydrALAZINE (APRESOLINE) 25 MG tablet Take 1 tablet (25 mg total) by mouth 3 (three) times daily. 270 tablet 3  . insulin aspart (NOVOLOG FLEXPEN) 100 UNIT/ML FlexPen Inject 10 Units into the skin 3 (three) times daily with meals. (Patient taking differently: Inject 12 Units into the skin 3 (three) times daily with meals. ) 45 mL 3  . Insulin Glargine (LANTUS SOLOSTAR) 100 UNIT/ML Solostar Pen Inject 16 Units into the skin daily at 10 pm. 15 mL 3  . isosorbide mononitrate (IMDUR) 60 MG 24 hr tablet Take 2 tablets (120 mg total) by mouth daily. (Patient taking  differently: Take 60 mg by mouth 2 (two) times daily. ) 180 tablet 3  . nitroGLYCERIN (NITROSTAT) 0.4 MG SL tablet Place 1 tablet (0.4 mg total) under the tongue every 5 (five) minutes as needed for chest pain (3 doses MAX). 25 tablet 3  . omeprazole (PRILOSEC) 20 MG capsule Take 1 capsule (20 mg total) by mouth 2 (two) times daily before a meal. 120 capsule 3  . pentoxifylline (TRENTAL) 400  MG CR tablet TAKE 1 TABLET TWICE DAILY  AT  10AM  AND  5PM 180 tablet 3  . polyethylene glycol (MIRALAX / GLYCOLAX) packet Take 17 g by mouth daily as needed for mild constipation or moderate constipation. 30 each 1  . timolol (TIMOPTIC) 0.5 % ophthalmic solution Place 1 drop into both eyes every morning.    . TRAVATAN Z 0.004 % SOLN ophthalmic solution Place 1 drop into both eyes every evening.     No current facility-administered medications on file prior to visit.     BP (!) 142/54   Temp 98.2 F (36.8 C) (Oral)   Wt 169 lb (76.7 kg)   BMI 24.25 kg/m       Objective:   Physical Exam  Constitutional: He is oriented to person, place, and time. He appears well-developed and well-nourished. No distress.  Cardiovascular: Normal rate, regular rhythm, normal heart sounds and intact distal pulses. Exam reveals no gallop.  No murmur heard. Pulmonary/Chest: Effort normal and breath sounds normal. No respiratory distress. He has no wheezes. He has no rales. He exhibits no tenderness.  Neurological: He is alert and oriented to person, place, and time.  Skin: Skin is warm and dry. No rash noted. He is not diaphoretic. There is erythema. No pallor.  Redness, warmth and swelling in MCP joint of right great toe   Psychiatric: He has a normal mood and affect. His behavior is normal. Judgment and thought content normal.  Nursing note and vitals reviewed.     Assessment & Plan:  1. Acute gout involving toe of right foot, unspecified cause - Prednisone 20 mg x 5 days  - Advised to drink plenty of water to help  keep blood sugars down  - Uric Acid - CBC with Differential/Platelet  Dorothyann Peng, NP

## 2017-01-23 ENCOUNTER — Telehealth: Payer: Self-pay | Admitting: Internal Medicine

## 2017-01-23 ENCOUNTER — Ambulatory Visit: Payer: Self-pay | Admitting: *Deleted

## 2017-01-23 NOTE — Telephone Encounter (Signed)
Copied from [place]. Topic: Quick Communication - See Telephone Encounter >> Jan 23, 2017 11:27 AM Hewitt Shorts wrote: CRM for notification. See Telephone encounter for:  01/23/17.

## 2017-01-23 NOTE — Telephone Encounter (Signed)
Called in c/o pain in right great toe joint.   He was just diagnosed with gout recently and prescribed Prednisone for 5 days.  It helped but did not relieve the pain.   He is wanting to know what the next step would be.   Also wanting to know how much cherry juice to drink to be effective and not affect his diabetes. I routed a note back to the nurse pool to give him a call back.  Reason for Disposition . Pain in the big toe joint  Answer Assessment - Initial Assessment Questions 1. ONSET: "When did the pain start?"      Pain in right big toe joint. 2. LOCATION: "Where is the pain located?"      *No Answer* 3. PAIN: "How bad is the pain?"    (Scale 1-10; or mild, moderate, severe)   -  MILD (1-3): doesn't interfere with normal activities    -  MODERATE (4-7): interferes with normal activities (e.g., work or school) or awakens from sleep, limping    -  SEVERE (8-10): excruciating pain, unable to do any normal activities, unable to walk     I'm limping a little.  I can't wear a shoe I have to wear a sandal 4. WORK OR EXERCISE: "Has there been any recent work or exercise that involved this part of the body?"      No 5. CAUSE: "What do you think is causing the foot pain?"     Gout 6. OTHER SYMPTOMS: "Do you have any other symptoms?" (e.g., leg pain, rash, fever, numbness)     No 7. PREGNANCY: "Is there any chance you are pregnant?" "When was your last menstrual period?"     N/A  Protocols used: FOOT PAIN-A-AH

## 2017-01-23 NOTE — Telephone Encounter (Signed)
Patient calling checking status, unable to add to triage note. Please advise

## 2017-01-24 ENCOUNTER — Other Ambulatory Visit: Payer: Self-pay | Admitting: Adult Health

## 2017-01-24 ENCOUNTER — Telehealth: Payer: Self-pay | Admitting: Family Medicine

## 2017-01-24 MED ORDER — PREDNISONE 10 MG PO TABS: 10.0000 mg | ORAL_TABLET | Freq: Every day | ORAL | 0 refills | Status: DC

## 2017-01-24 NOTE — Telephone Encounter (Signed)
Copied from [place]. Topic: Quick Communication - See Telephone Encounter >> Jan 23, 2017 11:27 AM Hewitt Shorts wrote: CRM for notification. See Telephone encounter for: pt is needing to talk with someone regarding his last visit with Dr. Dorothyann Peng the foot/toe is still hurting and he would like to know what to do next   Best number [phone]  01/23/17. >> Jan 24, 2017  8:44 AM Percell Belt A wrote: Pt called back in about someone calling him in some meds for his [place] in [place]

## 2017-01-24 NOTE — Telephone Encounter (Signed)
we can try another round of prednisone but he needs to drink a lot of water with it and check his BS, he may need to add a few units of insulin.   I changed the prescription, I sent in 10 mg every morning for 10 days.Sent to [place]

## 2017-01-24 NOTE — Telephone Encounter (Signed)
He did not check his sugars Does not know the readings prednisone did help pain Pain is better.  Still cannot wear a shoe Please advise.

## 2017-01-24 NOTE — Telephone Encounter (Signed)
Spoke to the pt.  Informed him that Tommi Rumps has sent in an additional 10 days of prednisone.  Instructed him to drink plenty of water and check bg.  Call is readings are high because additional units of insulin may need to be added.  Pt agreed to plan.  Will call back if bg readings are high.

## 2017-01-24 NOTE — Progress Notes (Signed)
Medication refill

## 2017-01-24 NOTE — Telephone Encounter (Signed)
How did his sugars respond with the prednisone? Tart cherry juice should only have 1 gram of sugar and cherries have a low glycemic index. He will just have to read the labels.

## 2017-01-30 ENCOUNTER — Ambulatory Visit (INDEPENDENT_AMBULATORY_CARE_PROVIDER_SITE_OTHER): Payer: Medicare HMO

## 2017-01-30 DIAGNOSIS — E538 Deficiency of other specified B group vitamins: Secondary | ICD-10-CM

## 2017-01-30 MED ORDER — CYANOCOBALAMIN 1000 MCG/ML IJ SOLN
1000.0000 ug | Freq: Once | INTRAMUSCULAR | Status: AC
  Administered 2017-01-30: 1000 ug via INTRAMUSCULAR

## 2017-01-31 ENCOUNTER — Telehealth: Payer: Self-pay | Admitting: Internal Medicine

## 2017-01-31 NOTE — Telephone Encounter (Signed)
Copied from [place]. Topic: Quick Communication - Rx Refill/Question >> Jan 31, 2017 12:39 PM Lennox Solders wrote: Has the patient contacted their pharmacy? no (Agent: If no, request that the patient contact the pharmacy for the refill.) Preferred Pharmacy (with phone number or street name):  [facility] [phone]. Pt is requesting new rx alluprinol for gout  Agent: Please be advised that RX refills may take up to 3 business days. We ask that you follow-up with your pharmacy.

## 2017-02-02 NOTE — Telephone Encounter (Signed)
Pt. ret'd call and requested to get a new prescription for Allopurinol.  Reported he is almost finished with 2nd Rx for Prednisone and the symptoms are not resolved.  C/o continued swelling and soreness of right great toe.  Appt. scheduled for 02/06/17 w/ Dr. Raliegh Ip.

## 2017-02-02 NOTE — Telephone Encounter (Signed)
Pt is asking about medication and when it will be filled. Please call [phone]

## 2017-02-02 NOTE — Telephone Encounter (Signed)
Called pt and left message to call back. Did not see dx og gout and no h/o being on Allopurinol in the history or at present. Called to discuss symptoms

## 2017-02-06 ENCOUNTER — Ambulatory Visit: Payer: Medicare HMO | Admitting: Internal Medicine

## 2017-02-08 ENCOUNTER — Ambulatory Visit (INDEPENDENT_AMBULATORY_CARE_PROVIDER_SITE_OTHER): Payer: Medicare HMO | Admitting: Internal Medicine

## 2017-02-08 ENCOUNTER — Encounter: Payer: Self-pay | Admitting: Internal Medicine

## 2017-02-08 VITALS — BP 118/70 | HR 64 | Temp 97.5°F | Ht 70.0 in | Wt 168.8 lb

## 2017-02-08 DIAGNOSIS — M109 Gout, unspecified: Secondary | ICD-10-CM | POA: Diagnosis not present

## 2017-02-08 DIAGNOSIS — M10371 Gout due to renal impairment, right ankle and foot: Secondary | ICD-10-CM | POA: Diagnosis not present

## 2017-02-08 DIAGNOSIS — M10379 Gout due to renal impairment, unspecified ankle and foot: Secondary | ICD-10-CM | POA: Insufficient documentation

## 2017-02-08 NOTE — Patient Instructions (Addendum)
Low-Purine Diet Purines are compounds that affect the level of uric acid in your body. A low-purine diet is a diet that is low in purines. Eating a low-purine diet can prevent the level of uric acid in your body from getting too high and causing gout or kidney stones or both. What do I need to know about this diet?  Choose low-purine foods. Examples of low-purine foods are listed in the next section.  Drink plenty of fluids, especially water. Fluids can help remove uric acid from your body. Try to drink 8-16 cups (1.9-3.8 L) a day.  Limit foods high in fat, especially saturated fat, as fat makes it harder for the body to get rid of uric acid. Foods high in saturated fat include pizza, cheese, ice cream, whole milk, fried foods, and gravies. Choose foods that are lower in fat and lean sources of protein. Use olive oil when cooking as it contains healthy fats that are not high in saturated fat.  Limit alcohol. Alcohol interferes with the elimination of uric acid from your body. If you are having a gout attack, avoid all alcohol.  Keep in mind that different people's bodies react differently to different foods. You will probably learn over time which foods do or do not affect you. If you discover that a food tends to cause your gout to flare up, avoid eating that food. You can more freely enjoy foods that do not cause problems. If you have any questions about a food item, talk to your dietitian or health care provider. Which foods are low, moderate, and high in purines? The following is a list of foods that are low, moderate, and high in purines. You can eat any amount of the foods that are low in purines. You may be able to have small amounts of foods that are moderate in purines. Ask your health care provider how much of a food moderate in purines you can have. Avoid foods high in purines. Grains  Foods low in purines: Enriched white bread, pasta, rice, cake, cornbread, popcorn.  Foods moderate in  purines: Whole-grain breads and cereals, wheat germ, bran, oatmeal. Uncooked oatmeal. Dry wheat bran or wheat germ.  Foods high in purines: Pancakes, French toast, biscuits, muffins. Vegetables  Foods low in purines: All vegetables, except those that are moderate in purines.  Foods moderate in purines: Asparagus, cauliflower, spinach, mushrooms, green peas. Fruits  All fruits are low in purines. Meats and other Protein Foods  Foods low in purines: Eggs, nuts, peanut butter.  Foods moderate in purines: 80-90% lean beef, lamb, veal, pork, poultry, fish, eggs, peanut butter, nuts. Crab, lobster, oysters, and shrimp. Cooked dried beans, peas, and lentils.  Foods high in purines: Anchovies, sardines, herring, mussels, tuna, codfish, scallops, trout, and haddock. Bacon. Organ meats (such as liver or kidney). Tripe. Game meat. Goose. Sweetbreads. Dairy  All dairy foods are low in purines. Low-fat and fat-free dairy products are best because they are low in saturated fat. Beverages  Drinks low in purines: Water, carbonated beverages, tea, coffee, cocoa.  Drinks moderate in purines: Soft drinks and other drinks sweetened with high-fructose corn syrup. Juices. To find whether a food or drink is sweetened with high-fructose corn syrup, look at the ingredients list.  Drinks high in purines: Alcoholic beverages (such as beer). Condiments  Foods low in purines: Salt, herbs, olives, pickles, relishes, vinegar.  Foods moderate in purines: Butter, margarine, oils, mayonnaise. Fats and Oils  Foods low in purines: All types, except gravies   and sauces made with meat.  Foods high in purines: Gravies and sauces made with meat. Other Foods  Foods low in purines: Sugars, sweets, gelatin. Cake. Soups made without meat.  Foods moderate in purines: Meat-based or fish-based soups, broths, or bouillons. Foods and drinks sweetened with high-fructose corn syrup.  Foods high in purines: High-fat desserts  (such as ice cream, cookies, cakes, pies, doughnuts, and chocolate). Contact your dietitian for more information on foods that are not listed here. This information is not intended to replace advice given to you by your health care provider. Make sure you discuss any questions you have with your health care provider. Document Released: 06/11/2010 Document Revised: 07/23/2015 Document Reviewed: 01/21/2013 Elsevier Interactive Patient Education  2017 [employer].  Gout Gout is painful swelling that can occur in some of your joints. Gout is a type of arthritis. This condition is caused by having too much uric acid in your body. Uric acid is a chemical that forms when your body breaks down substances called purines. Purines are important for building body proteins. When your body has too much uric acid, sharp crystals can form and build up inside your joints. This causes pain and swelling. Gout attacks can happen quickly and be very painful (acute gout). Over time, the attacks can affect more joints and become more frequent (chronic gout). Gout can also cause uric acid to build up under your skin and inside your kidneys. What are the causes? This condition is caused by too much uric acid in your blood. This can occur because:  Your kidneys do not remove enough uric acid from your blood. This is the most common cause.  Your body makes too much uric acid. This can occur with some cancers and cancer treatments. It can also occur if your body is breaking down too many red blood cells (hemolytic anemia).  You eat too many foods that are high in purines. These foods include organ meats and some seafood. Alcohol, especially beer, is also high in purines.  A gout attack may be triggered by trauma or stress. What increases the risk? This condition is more likely to develop in people who:  Have a family history of gout.  Are male and middle-aged.  Are male and have gone through menopause.  Are  obese.  Frequently drink alcohol, especially beer.  Are dehydrated.  Lose weight too quickly.  Have an organ transplant.  Have lead poisoning.  Take certain medicines, including aspirin, cyclosporine, diuretics, levodopa, and niacin.  Have kidney disease or psoriasis.  What are the signs or symptoms? An attack of acute gout happens quickly. It usually occurs in just one joint. The most common place is the big toe. Attacks often start at night. Other joints that may be affected include joints of the feet, ankle, knee, fingers, wrist, or elbow. Symptoms may include:  Severe pain.  Warmth.  Swelling.  Stiffness.  Tenderness. The affected joint may be very painful to touch.  Shiny, red, or purple skin.  Chills and fever.  Chronic gout may cause symptoms more frequently. More joints may be involved. You may also have white or yellow lumps (tophi) on your hands or feet or in other areas near your joints. How is this diagnosed? This condition is diagnosed based on your symptoms, medical history, and physical exam. You may have tests, such as:  Blood tests to measure uric acid levels.  Removal of joint fluid with a needle (aspiration) to look for uric acid crystals.  X-rays to look for joint damage.  How is this treated? Treatment for this condition has two phases: treating an acute attack and preventing future attacks. Acute gout treatment may include medicines to reduce pain and swelling, including:  NSAIDs.  Steroids. These are strong anti-inflammatory medicines that can be taken by mouth (orally) or injected into a joint.  Colchicine. This medicine relieves pain and swelling when it is taken soon after an attack. It can be given orally or through an IV tube.  Preventive treatment may include:  Daily use of smaller doses of NSAIDs or colchicine.  Use of a medicine that reduces uric acid levels in your blood.  Changes to your diet. You may need to see a specialist  about healthy eating (dietitian).  Follow these instructions at home: During a Gout Attack  If directed, apply ice to the affected area: ? Put ice in a plastic bag. ? Place a towel between your skin and the bag. ? Leave the ice on for 20 minutes, 2-3 times a day.  Rest the joint as much as possible. If the affected joint is in your leg, you may be given crutches to use.  Raise (elevate) the affected joint above the level of your heart as often as possible.  Drink enough fluids to keep your urine clear or pale yellow.  Take over-the-counter and prescription medicines only as told by your health care provider.  Do not drive or operate heavy machinery while taking prescription pain medicine.  Follow instructions from your health care provider about eating or drinking restrictions.  Return to your normal activities as told by your health care provider. Ask your health care provider what activities are safe for you. Avoiding Future Gout Attacks  Follow a low-purine diet as told by your dietitian or health care provider. Avoid foods and drinks that are high in purines, including liver, kidney, anchovies, asparagus, herring, mushrooms, mussels, and beer.  Limit alcohol intake to no more than 1 drink a day for nonpregnant women and 2 drinks a day for men. One drink equals 12 oz of beer, 5 oz of wine, or 1 oz of hard liquor.  Maintain a healthy weight or lose weight if you are overweight. If you want to lose weight, talk with your health care provider. It is important that you do not lose weight too quickly.  Start or maintain an exercise program as told by your health care provider.  Drink enough fluids to keep your urine clear or pale yellow.  Take over-the-counter and prescription medicines only as told by your health care provider.  Keep all follow-up visits as told by your health care provider. This is important. Contact a health care provider if:  You have another gout  attack.  You continue to have symptoms of a gout attack after10 days of treatment.  You have side effects from your medicines.  You have chills or a fever.  You have burning pain when you urinate.  You have pain in your lower back or belly. Get help right away if:  You have severe or uncontrolled pain.  You cannot urinate. This information is not intended to replace advice given to you by your health care provider. Make sure you discuss any questions you have with your health care provider. Document Released: 02/12/2000 Document Revised: 07/23/2015 Document Reviewed: 11/27/2014 Elsevier Interactive Patient Education  2017 [employer].

## 2017-02-08 NOTE — Progress Notes (Signed)
Subjective:    Patient ID: [name], male    DOB: [birth year], 83 y.o.   MRN: [id number]  HPI  83 year old patient who has a history of essential hypertension chronic kidney disease and type 2 diabetes. He was seen recently for suspected gout involving the right great toe symptoms have largely resolved.  Uric acid level was obtained that was elevated. He is seen today asking about starting allopurinol therapy.  Denies any prior history of gout.  Has not been on a low purine diet  Past Medical History:  Diagnosis Date  . Arthritis    "touch in my fingers" (09/10/2014)  . Atrophic gastritis   . AVM (arteriovenous malformation) of duodenum, acquired   . B12 deficiency anemia    "stopped taking the shots in ~ 06/2014" (09/10/2014)  . Barrett's esophagus   . CAD (coronary artery disease)    a. H/o MI in 1998;  b. s/p CABG 2003. c. 10/2014 NSTEMI/Cath: LM 75, LAD 131m/d, D1 95, D2 50, LCX 100ost/p, OM1 80, OM2 80, RCA 100p/m, RPDA fills via L->L collats, LIMA->LAD ok, VG->dRCA 100, VG->OM1->OM2 99 prox to OM1 insertion->recanalated ->Tx with heparin,  VG->D1 100-->Med Rx.  . Cataract   . Chronic systolic CHF (congestive heart failure) (HCC)    a. EF 40-45% in 10/2014 (previously normal in 08/2014).  . CKD (chronic kidney disease), stage IV ([place])   . Diabetes 1.5, managed as type 1 ([place])   . History of stomach ulcers 2013  . Hyperlipidemia   . Hypertension   . Hypertensive heart disease   . Ischemic cardiomyopathy    a. 10/2014 Echo: EF 40-45%.  . Myocardial infarction ([place])   . PVD (peripheral vascular disease) ([place])   . Type II diabetes mellitus ([place])      Social History   Socioeconomic History  . Marital status: Widowed    Spouse name: Not on file  . Number of children: Not on file  . Years of education: Not on file  . Highest education level: Not on file  Social Needs  . Financial resource strain: Not on file  . Food insecurity - worry: Not on file  . Food  insecurity - inability: Not on file  . Transportation needs - medical: Not on file  . Transportation needs - non-medical: Not on file  Occupational History  . Occupation: Retired  Tobacco Use  . Smoking status: Former Smoker    Packs/day: 0.50    Years: 20.00    Pack years: 10.00    Types: Cigarettes  . Smokeless tobacco: Never Used  . Tobacco comment: "quit smoking cigarettes in the 1960s   Substance and Sexual Activity  . Alcohol use: No  . Drug use: No  . Sexual activity: Not Currently  Other Topics Concern  . Not on file  Social History Narrative   Pt lives alone, family nearby.    Past Surgical History:  Procedure Laterality Date  . CARDIAC CATHETERIZATION  2003;   . CARDIAC CATHETERIZATION N/A 11/07/2014   Left Heart Cath; Belva Crome, MD;   . CATARACT EXTRACTION, BILATERAL Bilateral   . CHOLECYSTECTOMY N/A 05/20/2015   Procedure: LAPAROSCOPIC CHOLECYSTECTOMY;  Surgeon: Coralie Keens, MD;  Location: [facility];  Service: General;  Laterality: N/A;  . COLONOSCOPY    . COLONOSCOPY N/A 05/12/2015   Procedure: COLONOSCOPY;  Surgeon: Milus Banister, MD;  Location: [facility];  Service: Endoscopy;  Laterality: N/A;  . [place]   [name] 07/13/2010  .  CORONARY ARTERY BYPASS GRAFT  2003   CABG X5  . ESOPHAGOGASTRODUODENOSCOPY  06/10/2011   Procedure: ESOPHAGOGASTRODUODENOSCOPY (EGD);  Surgeon: Ladene Artist, MD,FACG;  Location[facility];  Service: Endoscopy;  Laterality: N/A;  . ESOPHAGOGASTRODUODENOSCOPY N/A 10/04/2016   Procedure: ESOPHAGOGASTRODUODENOSCOPY (EGD);  Surgeon: Manus Gunning, MD;  Location: [facility];  Service: Gastroenterology;  Laterality: N/A;    Family History  Problem Relation Age of Onset  . Stroke Mother   . Hypertension Mother   . Cancer Mother   . Diabetes Unknown        brothers and sisters  . Coronary artery disease Unknown   . Hypertension Sister   . Heart attack Sister   . Colon cancer Neg Hx     No  Known Allergies  Current Outpatient Medications on File Prior to Visit  Medication Sig Dispense Refill  . ACCU-CHEK AVIVA PLUS test strip CHECK BLOOD SUGAR EVERY DAY  AND AS NEEDED 100 each 12  . acetaminophen (TYLENOL) 500 MG tablet Take 500 mg by mouth every 8 (eight) hours as needed for mild pain or headache.    Marland Kitchen amLODipine (NORVASC) 10 MG tablet TAKE 1 TABLET EVERY DAY 90 tablet 3  . aspirin EC 81 MG tablet Take 81 mg by mouth at bedtime.    Marland Kitchen atorvastatin (LIPITOR) 80 MG tablet TAKE 1 TABLET DAILY AT 6 PM 90 tablet 3  . BD PEN NEEDLE NANO U/F 32G X 4 MM MISC USE TWO TIMES DAILY AS NEEDED. 180 each 4  . bisacodyl (DULCOLAX) 5 MG EC tablet Take 5 mg by mouth daily as needed for moderate constipation.    . carvedilol (COREG) 25 MG tablet Take 1 tablet (25 mg total) by mouth 2 (two) times daily with a meal. 180 tablet 3  . cloNIDine (CATAPRES) 0.1 MG tablet TAKE 1 TABLET AT BEDTIME 90 tablet 3  . Ferrous Sulfate (IRON) 325 (65 Fe) MG TABS Take 1 tablet by mouth daily. 30 each 0  . furosemide (LASIX) 40 MG tablet Take 1.5 tablets (60 mg) by mouth each morning and one (1) tablet (40 mg) by mouth each evening. 225 tablet 3  . insulin aspart (NOVOLOG FLEXPEN) 100 UNIT/ML FlexPen Inject 10 Units into the skin 3 (three) times daily with meals. (Patient taking differently: Inject 12 Units into the skin 3 (three) times daily with meals. ) 45 mL 3  . Insulin Glargine (LANTUS SOLOSTAR) 100 UNIT/ML Solostar Pen Inject 16 Units into the skin daily at 10 pm. 15 mL 3  . nitroGLYCERIN (NITROSTAT) 0.4 MG SL tablet Place 1 tablet (0.4 mg total) under the tongue every 5 (five) minutes as needed for chest pain (3 doses MAX). 25 tablet 3  . omeprazole (PRILOSEC) 20 MG capsule Take 1 capsule (20 mg total) by mouth 2 (two) times daily before a meal. 120 capsule 3  . pentoxifylline (TRENTAL) 400 MG CR tablet TAKE 1 TABLET TWICE DAILY  AT  10AM  AND  5PM 180 tablet 3  . polyethylene glycol (MIRALAX / GLYCOLAX) packet  Take 17 g by mouth daily as needed for mild constipation or moderate constipation. 30 each 1  . predniSONE (DELTASONE) 10 MG tablet Take 1 tablet (10 mg total) by mouth daily with breakfast. 10 tablet 0  . timolol (TIMOPTIC) 0.5 % ophthalmic solution Place 1 drop into both eyes every morning.    . TRAVATAN Z 0.004 % SOLN ophthalmic solution Place 1 drop into both eyes every evening.    Marland Kitchen  hydrALAZINE (APRESOLINE) 25 MG tablet Take 1 tablet (25 mg total) by mouth 3 (three) times daily. 270 tablet 3  . isosorbide mononitrate (IMDUR) 60 MG 24 hr tablet Take 2 tablets (120 mg total) by mouth daily. (Patient taking differently: Take 60 mg by mouth 2 (two) times daily. ) 180 tablet 3   No current facility-administered medications on file prior to visit.     BP 118/70 (BP Location: Left Arm, Patient Position: Sitting, Cuff Size: Normal)   Pulse 64   Temp (!) 97.5 F (36.4 C) (Oral)   Ht 5\' 10"  (1.778 m)   Wt 168 lb 12.8 oz (76.6 kg)   SpO2 98%   BMI 24.22 kg/m     Review of Systems  Constitutional: Positive for activity change and appetite change. Negative for chills, fatigue and fever.  HENT: Negative for congestion, dental problem, ear pain, hearing loss, sore throat, tinnitus, trouble swallowing and voice change.   Eyes: Negative for pain, discharge and visual disturbance.  Respiratory: Negative for cough, chest tightness, wheezing and stridor.   Cardiovascular: Negative for chest pain, palpitations and leg swelling.  Gastrointestinal: Negative for abdominal distention, abdominal pain, blood in stool, constipation, diarrhea, nausea and vomiting.  Genitourinary: Negative for difficulty urinating, discharge, flank pain, genital sores, hematuria and urgency.  Musculoskeletal: Positive for arthralgias and joint swelling. Negative for back pain, gait problem, myalgias and neck stiffness.  Skin: Negative for rash.  Neurological: Negative for dizziness, syncope, speech difficulty, weakness,  numbness and headaches.  Hematological: Negative for adenopathy. Does not bruise/bleed easily.  Psychiatric/Behavioral: Negative for behavioral problems and dysphoric mood. The patient is not nervous/anxious.        Objective:   Physical Exam  Constitutional: He appears well-developed and well-nourished.  Musculoskeletal:  Mild tenderness about the right first MTP joint with some postinflammatory hyperpigmentation.  Minimal swelling          Assessment & Plan:   History of suspected gout involving the right great toe Hyperuricemia  Options discussed.  Will hold allopurinol therapy at this time.  We will placed on a low purine diet.  Will consider allopurinol if patient has recurrent gouty arthritis  [name]

## 2017-03-01 ENCOUNTER — Telehealth: Payer: Self-pay

## 2017-03-01 ENCOUNTER — Other Ambulatory Visit (INDEPENDENT_AMBULATORY_CARE_PROVIDER_SITE_OTHER): Payer: Medicare HMO

## 2017-03-01 DIAGNOSIS — D649 Anemia, unspecified: Secondary | ICD-10-CM

## 2017-03-01 DIAGNOSIS — K5520 Angiodysplasia of colon without hemorrhage: Secondary | ICD-10-CM

## 2017-03-01 DIAGNOSIS — K2940 Chronic atrophic gastritis without bleeding: Secondary | ICD-10-CM | POA: Diagnosis not present

## 2017-03-01 DIAGNOSIS — K2270 Barrett's esophagus without dysplasia: Secondary | ICD-10-CM | POA: Diagnosis not present

## 2017-03-01 DIAGNOSIS — K558 Other vascular disorders of intestine: Secondary | ICD-10-CM | POA: Diagnosis not present

## 2017-03-01 LAB — CBC WITH DIFFERENTIAL/PLATELET
BASOS PCT: 0.7 % (ref 0.0–3.0)
Basophils Absolute: 0 10*3/uL (ref 0.0–0.1)
EOS ABS: 0.2 10*3/uL (ref 0.0–0.7)
EOS PCT: 3.4 % (ref 0.0–5.0)
HCT: 37.6 % — ABNORMAL LOW (ref 39.0–52.0)
HEMOGLOBIN: 12.6 g/dL — AB (ref 13.0–17.0)
Lymphocytes Relative: 18.1 % (ref 12.0–46.0)
Lymphs Abs: 1 10*3/uL (ref 0.7–4.0)
MCHC: 33.4 g/dL (ref 30.0–36.0)
MCV: 93.4 fl (ref 78.0–100.0)
Monocytes Absolute: 0.4 10*3/uL (ref 0.1–1.0)
Monocytes Relative: 7.3 % (ref 3.0–12.0)
Neutro Abs: 3.8 10*3/uL (ref 1.4–7.7)
Neutrophils Relative %: 70.5 % (ref 43.0–77.0)
Platelets: 222 10*3/uL (ref 150.0–400.0)
RBC: 4.03 Mil/uL — AB (ref 4.22–5.81)
RDW: 16 % — AB (ref 11.5–15.5)
WBC: 5.4 10*3/uL (ref 4.0–10.5)

## 2017-03-01 NOTE — Telephone Encounter (Signed)
-----   Message from Roetta Sessions, [place] sent at 11/29/2016 11:31 AM EDT ----- Regarding: repeat cbc needed Patient was here for OV on 11-29-16.  Needs repeat CBC in  3 months = Jan 2019

## 2017-03-01 NOTE — Telephone Encounter (Signed)
Called and spoke to pt.  He expressed understanding and said he would go to the lab this week or next to have CBC done. Labs are entered

## 2017-03-06 ENCOUNTER — Ambulatory Visit (INDEPENDENT_AMBULATORY_CARE_PROVIDER_SITE_OTHER): Payer: Medicare HMO | Admitting: Family Medicine

## 2017-03-06 DIAGNOSIS — E538 Deficiency of other specified B group vitamins: Secondary | ICD-10-CM | POA: Diagnosis not present

## 2017-03-06 MED ORDER — CYANOCOBALAMIN 1000 MCG/ML IJ SOLN
1000.0000 ug | Freq: Once | INTRAMUSCULAR | Status: AC
  Administered 2017-03-06: 1000 ug via INTRAMUSCULAR

## 2017-03-08 ENCOUNTER — Other Ambulatory Visit: Payer: Self-pay | Admitting: Internal Medicine

## 2017-03-08 NOTE — Telephone Encounter (Signed)
[facility] requesting prescription for an Accu check meter, Accu check soft clix, Alcohol pads and Accu check solution

## 2017-03-08 NOTE — Telephone Encounter (Signed)
Copied from [place] [id number]. Topic: Quick Communication - Rx Refill/Question >> Mar 08, 2017 11:37 AM Synthia Innocent wrote: Medication: Accu check meter, accu check soft clix, alchol pads and accu check solution  Has the patient contacted their pharmacy? Yes.     (Agent: If no, request that the patient contact the pharmacy for the refill.)   Preferred Pharmacy (with phone number or street name): [facility]   Agent: Please be advised that RX refills may take up to 3 business days. We ask that you follow-up with your pharmacy.

## 2017-03-09 MED ORDER — GLUCOSE BLOOD VI STRP: ORAL_STRIP | 3 refills | Status: DC

## 2017-03-09 MED ORDER — ACCU-CHEK SOFTCLIX LANCET DEV MISC: 0 refills | Status: AC

## 2017-03-09 MED ORDER — ALCOHOL PREP PADS: MEDICATED_PAD | 3 refills | Status: AC

## 2017-03-09 MED ORDER — ACCU-CHEK AVIVA DEVI: 0 refills | Status: AC

## 2017-03-09 NOTE — Telephone Encounter (Signed)
Sent to the pharmacy by e-scribe. 

## 2017-03-13 ENCOUNTER — Ambulatory Visit: Payer: Medicare HMO

## 2017-03-27 DIAGNOSIS — D631 Anemia in chronic kidney disease: Secondary | ICD-10-CM | POA: Diagnosis not present

## 2017-03-27 DIAGNOSIS — N2581 Secondary hyperparathyroidism of renal origin: Secondary | ICD-10-CM | POA: Diagnosis not present

## 2017-03-27 DIAGNOSIS — N184 Chronic kidney disease, stage 4 (severe): Secondary | ICD-10-CM | POA: Diagnosis not present

## 2017-04-03 ENCOUNTER — Ambulatory Visit (INDEPENDENT_AMBULATORY_CARE_PROVIDER_SITE_OTHER): Payer: Medicare HMO

## 2017-04-03 DIAGNOSIS — E538 Deficiency of other specified B group vitamins: Secondary | ICD-10-CM

## 2017-04-03 MED ORDER — CYANOCOBALAMIN 1000 MCG/ML IJ SOLN
1000.0000 ug | Freq: Once | INTRAMUSCULAR | Status: AC
  Administered 2017-04-03: 1000 ug via INTRAMUSCULAR

## 2017-04-05 DIAGNOSIS — N2581 Secondary hyperparathyroidism of renal origin: Secondary | ICD-10-CM | POA: Diagnosis not present

## 2017-04-05 DIAGNOSIS — D631 Anemia in chronic kidney disease: Secondary | ICD-10-CM | POA: Diagnosis not present

## 2017-04-05 DIAGNOSIS — E611 Iron deficiency: Secondary | ICD-10-CM | POA: Diagnosis not present

## 2017-04-05 DIAGNOSIS — I129 Hypertensive chronic kidney disease with stage 1 through stage 4 chronic kidney disease, or unspecified chronic kidney disease: Secondary | ICD-10-CM | POA: Diagnosis not present

## 2017-04-05 DIAGNOSIS — N184 Chronic kidney disease, stage 4 (severe): Secondary | ICD-10-CM | POA: Diagnosis not present

## 2017-04-06 ENCOUNTER — Other Ambulatory Visit: Payer: Self-pay | Admitting: Interventional Cardiology

## 2017-04-17 ENCOUNTER — Other Ambulatory Visit: Payer: Self-pay | Admitting: Internal Medicine

## 2017-04-24 ENCOUNTER — Encounter: Payer: Self-pay | Admitting: Internal Medicine

## 2017-04-24 DIAGNOSIS — E119 Type 2 diabetes mellitus without complications: Secondary | ICD-10-CM | POA: Diagnosis not present

## 2017-04-24 DIAGNOSIS — H401132 Primary open-angle glaucoma, bilateral, moderate stage: Secondary | ICD-10-CM | POA: Diagnosis not present

## 2017-04-24 DIAGNOSIS — H2000 Unspecified acute and subacute iridocyclitis: Secondary | ICD-10-CM | POA: Diagnosis not present

## 2017-04-24 LAB — HM DIABETES EYE EXAM

## 2017-04-27 ENCOUNTER — Ambulatory Visit (INDEPENDENT_AMBULATORY_CARE_PROVIDER_SITE_OTHER): Payer: Medicare HMO | Admitting: Internal Medicine

## 2017-04-27 ENCOUNTER — Encounter: Payer: Self-pay | Admitting: Internal Medicine

## 2017-04-27 VITALS — BP 110/46 | HR 63 | Temp 98.1°F | Wt 168.0 lb

## 2017-04-27 DIAGNOSIS — I10 Essential (primary) hypertension: Secondary | ICD-10-CM | POA: Diagnosis not present

## 2017-04-27 DIAGNOSIS — I25709 Atherosclerosis of coronary artery bypass graft(s), unspecified, with unspecified angina pectoris: Secondary | ICD-10-CM | POA: Diagnosis not present

## 2017-04-27 DIAGNOSIS — I739 Peripheral vascular disease, unspecified: Secondary | ICD-10-CM | POA: Diagnosis not present

## 2017-04-27 DIAGNOSIS — E0821 Diabetes mellitus due to underlying condition with diabetic nephropathy: Secondary | ICD-10-CM | POA: Diagnosis not present

## 2017-04-27 DIAGNOSIS — E538 Deficiency of other specified B group vitamins: Secondary | ICD-10-CM

## 2017-04-27 MED ORDER — CYANOCOBALAMIN 1000 MCG/ML IJ SOLN
1000.0000 ug | Freq: Once | INTRAMUSCULAR | Status: AC
Start: 2017-04-27 — End: 2017-04-27
  Administered 2017-04-27: 1000 ug via INTRAMUSCULAR

## 2017-04-27 NOTE — Progress Notes (Signed)
Subjective:     Patient ID: [name], male   DOB: 05-Nov-1931, 84 y.o.   MRN: [id number]  HPI  84 year old patient who is seen today for his quarterly follow-up.  He has type 2 diabetes and chronic kidney disease.  Presently controlled on basal insulin only. Doing well today without concerns or complaints.  He does state his appetite has been a bit subpar for the past week  Wt Readings from Last 3 Encounters:  04/27/17 168 lb (76.2 kg)  02/08/17 168 lb 12.8 oz (76.6 kg)  01/16/17 169 lb (76.7 kg)   He has coronary artery disease which has been stable.  No exertional chest pain.  He has essential hypertension and chronic systolic heart failure.  Past Medical History:  Diagnosis Date  . Arthritis    "touch in my fingers" (09/10/2014)  . Atrophic gastritis   . AVM (arteriovenous malformation) of duodenum, acquired   . B12 deficiency anemia    "stopped taking the shots in ~ 06/2014" (09/10/2014)  . Barrett's esophagus   . CAD (coronary artery disease)    a. H/o MI in 1998;  b. s/p CABG 2003. c. 10/2014 NSTEMI/Cath: LM 75, LAD 139m/d, D1 95, D2 50, LCX 100ost/p, OM1 80, OM2 80, RCA 100p/m, RPDA fills via L->L collats, LIMA->LAD ok, VG->dRCA 100, VG->OM1->OM2 99 prox to OM1 insertion->recanalated ->Tx with heparin,  VG->D1 100-->Med Rx.  . Cataract   . Chronic systolic CHF (congestive heart failure) (HCC)    a. EF 40-45% in 10/2014 (previously normal in 08/2014).  . CKD (chronic kidney disease), stage IV ([place])   . Diabetes 1.5, managed as type 1 ([place])   . History of stomach ulcers 2013  . Hyperlipidemia   . Hypertension   . Hypertensive heart disease   . Ischemic cardiomyopathy    a. 10/2014 Echo: EF 40-45%.  . Myocardial infarction ([place])   . PVD (peripheral vascular disease) ([place])   . Type II diabetes mellitus ([place])      Social History   Socioeconomic History  . Marital status: Widowed    Spouse name: Not on file  . Number of children: Not on file  . Years of education:  Not on file  . Highest education level: Not on file  Social Needs  . Financial resource strain: Not on file  . Food insecurity - worry: Not on file  . Food insecurity - inability: Not on file  . Transportation needs - medical: Not on file  . Transportation needs - non-medical: Not on file  Occupational History  . Occupation: Retired  Tobacco Use  . Smoking status: Former Smoker    Packs/day: 0.50    Years: 20.00    Pack years: 10.00    Types: Cigarettes  . Smokeless tobacco: Never Used  . Tobacco comment: "quit smoking cigarettes in the 1960s   Substance and Sexual Activity  . Alcohol use: No  . Drug use: No  . Sexual activity: Not Currently  Other Topics Concern  . Not on file  Social History Narrative   Pt lives alone, family nearby.    Past Surgical History:  Procedure Laterality Date  . CARDIAC CATHETERIZATION  2003;   . CARDIAC CATHETERIZATION N/A 11/07/2014   Left Heart Cath; Belva Crome, MD;   . CATARACT EXTRACTION, BILATERAL Bilateral   . CHOLECYSTECTOMY N/A 05/20/2015   Procedure: LAPAROSCOPIC CHOLECYSTECTOMY;  Surgeon: Coralie Keens, MD;  Location: [place];  Service: General;  Laterality: N/A;  . COLONOSCOPY    .  COLONOSCOPY N/A 05/12/2015   Procedure: COLONOSCOPY;  Surgeon: Milus Banister, MD;  Location: [place];  Service: Endoscopy;  Laterality: N/A;  . [place]   [name] 07/13/2010  . CORONARY ARTERY BYPASS GRAFT  2003   CABG X5  . ESOPHAGOGASTRODUODENOSCOPY  06/10/2011   Procedure: ESOPHAGOGASTRODUODENOSCOPY (EGD);  Surgeon: Ladene Artist, MD,FACG;  Location[facility] ENDOSCOPY;  Service: Endoscopy;  Laterality: N/A;  . ESOPHAGOGASTRODUODENOSCOPY N/A 10/04/2016   Procedure: ESOPHAGOGASTRODUODENOSCOPY (EGD);  Surgeon: Manus Gunning, MD;  Location: [facility];  Service: Gastroenterology;  Laterality: N/A;    Family History  Problem Relation Age of Onset  . Stroke Mother   . Hypertension Mother   . Cancer Mother   . Diabetes  Unknown        brothers and sisters  . Coronary artery disease Unknown   . Hypertension Sister   . Heart attack Sister   . Colon cancer Neg Hx     No Known Allergies  Current Outpatient Medications on File Prior to Visit  Medication Sig Dispense Refill  . ACCU-CHEK SOFTCLIX LANCETS lancets CHECK BLOOD GLUCOSE THREE TIMES DAILY 100 each 0  . acetaminophen (TYLENOL) 500 MG tablet Take 500 mg by mouth every 8 (eight) hours as needed for mild pain or headache.    . Alcohol Swabs (ALCOHOL PREP) PADS USE TO TEST BLOOD GLUCOSE THREE TIMES DAILY 300 each 3  . amLODipine (NORVASC) 10 MG tablet TAKE 1 TABLET EVERY DAY 90 tablet 3  . aspirin EC 81 MG tablet Take 81 mg by mouth at bedtime.    Marland Kitchen atorvastatin (LIPITOR) 80 MG tablet TAKE 1 TABLET DAILY AT 6 PM 90 tablet 3  . BD PEN NEEDLE NANO U/F 32G X 4 MM MISC USE TWO TIMES DAILY AS NEEDED. 180 each 4  . bisacodyl (DULCOLAX) 5 MG EC tablet Take 5 mg by mouth daily as needed for moderate constipation.    . Blood Glucose Monitoring Suppl (ACCU-CHEK AVIVA) device USE TO TEST BLOOD GLUCOSE THREE TIMES DAILY 1 each 0  . carvedilol (COREG) 25 MG tablet Take 1 tablet (25 mg total) by mouth 2 (two) times daily with a meal. 180 tablet 3  . cloNIDine (CATAPRES) 0.1 MG tablet TAKE 1 TABLET AT BEDTIME 90 tablet 3  . Ferrous Sulfate (IRON) 325 (65 Fe) MG TABS Take 1 tablet by mouth daily. 30 each 0  . furosemide (LASIX) 40 MG tablet Take 1.5 tablets (60 mg) by mouth each morning and one (1) tablet (40 mg) by mouth each evening. 225 tablet 3  . glucose blood (ACCU-CHEK AVIVA PLUS) test strip USE TO TEST BLOOD GLUCOSE THREE TIMES DAILY 300 each 3  . hydrALAZINE (APRESOLINE) 25 MG tablet Take 1 tablet (25 mg total) by mouth 3 (three) times daily. 270 tablet 2  . insulin aspart (NOVOLOG FLEXPEN) 100 UNIT/ML FlexPen Inject 10 Units into the skin 3 (three) times daily with meals. (Patient taking differently: Inject 12 Units into the skin 3 (three) times daily with  meals. ) 45 mL 3  . Insulin Glargine (LANTUS SOLOSTAR) 100 UNIT/ML Solostar Pen Inject 16 Units into the skin daily at 10 pm. 15 mL 3  . Lancet Devices (ACCU-CHEK SOFTCLIX) lancets USE TO TEST BLOOD GLUCOSE THREE TIMES DAILY 1 each 0  . nitroGLYCERIN (NITROSTAT) 0.4 MG SL tablet Place 1 tablet (0.4 mg total) under the tongue every 5 (five) minutes as needed for chest pain (3 doses MAX). 25 tablet 3  . omeprazole (PRILOSEC) 20  MG capsule Take 1 capsule (20 mg total) by mouth 2 (two) times daily before a meal. 120 capsule 3  . pentoxifylline (TRENTAL) 400 MG CR tablet TAKE 1 TABLET TWICE DAILY  AT  10AM  AND  5PM 180 tablet 3  . polyethylene glycol (MIRALAX / GLYCOLAX) packet Take 17 g by mouth daily as needed for mild constipation or moderate constipation. 30 each 1  . predniSONE (DELTASONE) 10 MG tablet Take 1 tablet (10 mg total) by mouth daily with breakfast. 10 tablet 0  . timolol (TIMOPTIC) 0.5 % ophthalmic solution Place 1 drop into both eyes every morning.    . TRAVATAN Z 0.004 % SOLN ophthalmic solution Place 1 drop into both eyes every evening.    . isosorbide mononitrate (IMDUR) 60 MG 24 hr tablet Take 2 tablets (120 mg total) by mouth daily. (Patient taking differently: Take 60 mg by mouth 2 (two) times daily. ) 180 tablet 3   No current facility-administered medications on file prior to visit.     BP (!) 110/46 (BP Location: Right Arm, Patient Position: Sitting, Cuff Size: Normal)   Pulse 63   Temp 98.1 F (36.7 C) (Oral)   Wt 168 lb (76.2 kg)   SpO2 98%   BMI 24.11 kg/m     Review of Systems  Constitutional: Negative for appetite change, chills, fatigue and fever.  HENT: Negative for congestion, dental problem, ear pain, hearing loss, sore throat, tinnitus, trouble swallowing and voice change.   Eyes: Negative for pain, discharge and visual disturbance.  Respiratory: Negative for cough, chest tightness, wheezing and stridor.   Cardiovascular: Negative for chest pain,  palpitations and leg swelling.  Gastrointestinal: Negative for abdominal distention, abdominal pain, blood in stool, constipation, diarrhea, nausea and vomiting.  Genitourinary: Negative for difficulty urinating, discharge, flank pain, genital sores, hematuria and urgency.  Musculoskeletal: Negative for arthralgias, back pain, gait problem, joint swelling, myalgias and neck stiffness.  Skin: Negative for rash.  Neurological: Negative for dizziness, syncope, speech difficulty, weakness, numbness and headaches.  Hematological: Negative for adenopathy. Does not bruise/bleed easily.  Psychiatric/Behavioral: Negative for behavioral problems and dysphoric mood. The patient is not nervous/anxious.        Objective:   Physical Exam  Constitutional: He is oriented to person, place, and time. He appears well-developed.  HENT:  Head: Normocephalic.  Right Ear: External ear normal.  Left Ear: External ear normal.  Eyes: Conjunctivae and EOM are normal.  Neck: Normal range of motion.  Cardiovascular: Normal rate and normal heart sounds.  Pulmonary/Chest: Breath sounds normal.  Abdominal: Bowel sounds are normal.  Musculoskeletal: Normal range of motion. He exhibits no edema or tenderness.  Neurological: He is alert and oriented to person, place, and time.  Psychiatric: He has a normal mood and affect. His behavior is normal.       Assessment:     Diabetes mellitus.  Hemoglobin A1c today 7.0 but trending up Chronic kidney disease Essential hypertension.  No change in therapy  B12 deficiency.  Will give his monthly injection today     Plan:     No change in medical therapy CPX 3 months with lab  [name]

## 2017-04-27 NOTE — Patient Instructions (Signed)
Limit your sodium (Salt) intake  Return in 3 months for follow-up   Please check your hemoglobin A1c every 3 months  Continue monthly B12 injection

## 2017-05-03 ENCOUNTER — Other Ambulatory Visit: Payer: Self-pay | Admitting: Interventional Cardiology

## 2017-05-03 ENCOUNTER — Ambulatory Visit: Payer: Medicare HMO

## 2017-05-03 ENCOUNTER — Other Ambulatory Visit: Payer: Self-pay | Admitting: Internal Medicine

## 2017-05-31 ENCOUNTER — Ambulatory Visit (INDEPENDENT_AMBULATORY_CARE_PROVIDER_SITE_OTHER): Payer: Medicare HMO | Admitting: Family Medicine

## 2017-05-31 ENCOUNTER — Telehealth: Payer: Self-pay | Admitting: Family Medicine

## 2017-05-31 DIAGNOSIS — E538 Deficiency of other specified B group vitamins: Secondary | ICD-10-CM | POA: Diagnosis not present

## 2017-05-31 MED ORDER — CYANOCOBALAMIN 1000 MCG/ML IJ SOLN
1000.0000 ug | Freq: Once | INTRAMUSCULAR | Status: AC
  Administered 2017-05-31: 1000 ug via INTRAMUSCULAR

## 2017-05-31 NOTE — Telephone Encounter (Signed)
How often for Vitamin B 12 injections & are any labs due for this?

## 2017-05-31 NOTE — Progress Notes (Signed)
Per orders of Dr. Kwiatkowski, injection of Vitamin B 12 given by Terah Robey ANN. Patient tolerated injection well. 

## 2017-05-31 NOTE — Telephone Encounter (Signed)
Vitamin B12 1 mg IM monthly.  No further vitamin B12 levels needed routinely

## 2017-06-08 ENCOUNTER — Telehealth: Payer: Self-pay

## 2017-06-08 NOTE — Telephone Encounter (Signed)
-----   Message from Roetta Sessions, [place] sent at 03/02/2017  7:48 AM EST ----- Regarding: repeat cbc Jan can you please let this patient know his anemia is slowly improving, suspect multifactorial. He should continue oral iron, I would repeat a CBC in 3-4 months. Thanks

## 2017-06-08 NOTE — Telephone Encounter (Signed)
Called and LM for pt with results and recommendations.  Left number for pt to call back with questions. Reminder placed for repeat labs

## 2017-06-19 ENCOUNTER — Other Ambulatory Visit: Payer: Self-pay | Admitting: Gastroenterology

## 2017-06-28 ENCOUNTER — Other Ambulatory Visit: Payer: Self-pay | Admitting: Internal Medicine

## 2017-06-30 ENCOUNTER — Telehealth: Payer: Self-pay | Admitting: Internal Medicine

## 2017-06-30 NOTE — Telephone Encounter (Signed)
See 05/31/17 phone note. B12 injections monthly.  Called patient and notified him and schedule next injection.

## 2017-06-30 NOTE — Telephone Encounter (Signed)
Copied from [place]. Topic: Quick Communication - See Telephone Encounter >> Jun 30, 2017 11:56 AM Cleaster Corin, NT wrote: CRM for notification. See Telephone encounter for: 06/30/17.  Pt. Calling Asking when does he need to schedule his b-12 injection appt.

## 2017-07-03 ENCOUNTER — Ambulatory Visit (INDEPENDENT_AMBULATORY_CARE_PROVIDER_SITE_OTHER): Payer: Medicare HMO | Admitting: *Deleted

## 2017-07-03 DIAGNOSIS — E538 Deficiency of other specified B group vitamins: Secondary | ICD-10-CM | POA: Diagnosis not present

## 2017-07-03 MED ORDER — CYANOCOBALAMIN 1000 MCG/ML IJ SOLN
1000.0000 ug | Freq: Once | INTRAMUSCULAR | Status: AC
  Administered 2017-07-03: 1000 ug via INTRAMUSCULAR

## 2017-07-03 NOTE — Progress Notes (Signed)
Per orders of Dr. Kwiatkowski, injection of B12 given by Aydian Dimmick J Hadlie Gipson. Patient tolerated injection well. 

## 2017-07-05 ENCOUNTER — Other Ambulatory Visit: Payer: Self-pay | Admitting: Interventional Cardiology

## 2017-07-11 ENCOUNTER — Telehealth: Payer: Self-pay | Admitting: Gastroenterology

## 2017-07-11 NOTE — Telephone Encounter (Signed)
Spoke to patient to let him know he is taking the omeprazole due to gastritis and short segment of Barrett's esophagus. Patient had received a pharmacy review sheet from his mail order company and was questioning why since reason they had was for heartburn.

## 2017-07-25 ENCOUNTER — Ambulatory Visit: Payer: Medicare HMO

## 2017-07-26 ENCOUNTER — Encounter: Payer: Medicare HMO | Admitting: Internal Medicine

## 2017-07-26 ENCOUNTER — Ambulatory Visit: Payer: Medicare HMO

## 2017-07-31 ENCOUNTER — Ambulatory Visit: Payer: Medicare HMO

## 2017-08-01 NOTE — Progress Notes (Addendum)
Subjective:   [name] is a 84 y.o. male who presents for Medicare Annual/Subsequent preventive examination.  Reports health as fair Seen Dr. Raliegh Ip today and rechecking labs Type 2 DM   Lives alone;  Still driving 2 children  One in [facility] and one [place]  His son calls him every day Lost his dtr at 66 -aneurysm  Lives in [facility] off [address]; In a house; duplex  Walk in shower  No issues;    Diet  BMI 23.8 Wt 164;  Appetite has not been great  Breakfast egg, sausage;  Lunch; corn bread and salad, turnip greens Dinner; pork chop  Given samples of Glycerna   Exercise Goes [place] [facility] x 2 per week 60 minutes  Balance, the silver sneaker program   Health Maintenance Due  Topic Date Due  . URINE MICROALBUMIN  03/29/2015  . HEMOGLOBIN A1C  06/27/2017   Diabetic eye exam 04/24/2017 and positive for DM retinopathy - [facility]  Colonoscopy 04/2015     Cardiac Risk Factors include: advanced age (>57men, >65 women);diabetes mellitus;dyslipidemia;family history of premature cardiovascular disease;hypertension;male gender;microalbuminuria   Past the age for AAA due to smoking hx      Objective:    Vitals: BP 130/68   Ht 5\' 10"  (1.778 m)   Wt 166 lb (75.3 kg)   BMI 23.82 kg/m   Body mass index is 23.82 kg/m.  Advanced Directives 10/04/2016 10/03/2016 09/19/2016 07/20/2016 05/25/2015 05/17/2015 05/10/2015  Does Patient Have a Medical Advance Directive? Yes Yes Yes Yes No No No  Type of Paramedic of [place];Living will - [place];Living will - - - -  Copy of [place] in Chart? No - copy requested No - copy requested No - copy requested - - - -  Would patient like information on creating a medical advance directive? - - - - No - patient declined information No - patient declined information No - patient declined information  Pre-existing out of facility DNR order (yellow form or pink MOST form) - - - - -  - -    Tobacco Social History   Tobacco Use  Smoking Status Former Smoker  . Packs/day: 0.50  . Years: 20.00  . Pack years: 10.00  . Types: Cigarettes  Smokeless Tobacco Never Used  Tobacco Comment   "quit smoking cigarettes in the 1960s      Counseling given: Yes Comment: "quit smoking cigarettes in the 1960s    Clinical Intake:   Past Medical History:  Diagnosis Date  . Arthritis    "touch in my fingers" (09/10/2014)  . Atrophic gastritis   . AVM (arteriovenous malformation) of duodenum, acquired   . B12 deficiency anemia    "stopped taking the shots in ~ 06/2014" (09/10/2014)  . Barrett's esophagus   . CAD (coronary artery disease)    a. H/o MI in 1998;  b. s/p CABG 2003. c. 10/2014 NSTEMI/Cath: LM 75, LAD 151m/d, D1 95, D2 50, LCX 100ost/p, OM1 80, OM2 80, RCA 100p/m, RPDA fills via L->L collats, LIMA->LAD ok, VG->dRCA 100, VG->OM1->OM2 99 prox to OM1 insertion->recanalated ->Tx with heparin,  VG->D1 100-->Med Rx.  . Cataract   . Chronic systolic CHF (congestive heart failure) (HCC)    a. EF 40-45% in 10/2014 (previously normal in 08/2014).  . CKD (chronic kidney disease), stage IV ([place])   . Diabetes 1.5, managed as type 1 ([place])   . History of stomach ulcers 2013  . Hyperlipidemia   .  Hypertension   . Hypertensive heart disease   . Ischemic cardiomyopathy    a. 10/2014 Echo: EF 40-45%.  . Myocardial infarction ([place])   . PVD (peripheral vascular disease) ([place])   . Type II diabetes mellitus ([place])    Past Surgical History:  Procedure Laterality Date  . CARDIAC CATHETERIZATION  2003;   . CARDIAC CATHETERIZATION N/A 11/07/2014   Left Heart Cath; Belva Crome, MD;   . CATARACT EXTRACTION, BILATERAL Bilateral   . CHOLECYSTECTOMY N/A 05/20/2015   Procedure: LAPAROSCOPIC CHOLECYSTECTOMY;  Surgeon: Coralie Keens, MD;  Location: [place];  Service: General;  Laterality: N/A;  . COLONOSCOPY    . COLONOSCOPY N/A 05/12/2015   Procedure: COLONOSCOPY;  Surgeon: Milus Banister,  MD;  Location: [place];  Service: Endoscopy;  Laterality: N/A;  . [place]   [name] 07/13/2010  . CORONARY ARTERY BYPASS GRAFT  2003   CABG X5  . ESOPHAGOGASTRODUODENOSCOPY  06/10/2011   Procedure: ESOPHAGOGASTRODUODENOSCOPY (EGD);  Surgeon: Ladene Artist, MD,FACG;  Location: [facility] ENDOSCOPY;  Service: Endoscopy;  Laterality: N/A;  . ESOPHAGOGASTRODUODENOSCOPY N/A 10/04/2016   Procedure: ESOPHAGOGASTRODUODENOSCOPY (EGD);  Surgeon: Manus Gunning, MD;  Location: [facility];  Service: Gastroenterology;  Laterality: N/A;   Family History  Problem Relation Age of Onset  . Stroke Mother   . Hypertension Mother   . Cancer Mother   . Diabetes Unknown        brothers and sisters  . Coronary artery disease Unknown   . Hypertension Sister   . Heart attack Sister   . Colon cancer Neg Hx    Social History   Socioeconomic History  . Marital status: Widowed    Spouse name: Not on file  . Number of children: Not on file  . Years of education: Not on file  . Highest education level: Not on file  Occupational History  . Occupation: Retired  Scientific laboratory technician  . Financial resource strain: Not on file  . Food insecurity:    Worry: Not on file    Inability: Not on file  . Transportation needs:    Medical: Not on file    Non-medical: Not on file  Tobacco Use  . Smoking status: Former Smoker    Packs/day: 0.50    Years: 20.00    Pack years: 10.00    Types: Cigarettes  . Smokeless tobacco: Never Used  . Tobacco comment: "quit smoking cigarettes in the 1960s   Substance and Sexual Activity  . Alcohol use: No  . Drug use: No  . Sexual activity: Not Currently  Lifestyle  . Physical activity:    Days per week: Not on file    Minutes per session: Not on file  . Stress: Not on file  Relationships  . Social connections:    Talks on phone: Not on file    Gets together: Not on file    Attends religious service: Not on file    Active member of club or  organization: Not on file    Attends meetings of clubs or organizations: Not on file    Relationship status: Not on file  Other Topics Concern  . Not on file  Social History Narrative   Pt lives alone, family nearby.    Outpatient Encounter Medications as of 08/02/2017  Medication Sig  . ACCU-CHEK SOFTCLIX LANCETS lancets CHECK BLOOD GLUCOSE THREE TIMES DAILY  . acetaminophen (TYLENOL) 500 MG tablet Take 500 mg by mouth every 8 (eight) hours as needed for  mild pain or headache.  . Alcohol Swabs (ALCOHOL PREP) PADS USE TO TEST BLOOD GLUCOSE THREE TIMES DAILY  . amLODipine (NORVASC) 10 MG tablet TAKE 1 TABLET EVERY DAY  . aspirin EC 81 MG tablet Take 81 mg by mouth at bedtime.  Marland Kitchen atorvastatin (LIPITOR) 80 MG tablet TAKE 1 TABLET DAILY AT 6 PM  . BD PEN NEEDLE NANO U/F 32G X 4 MM MISC USE TWO TIMES DAILY AS NEEDED.  . bisacodyl (DULCOLAX) 5 MG EC tablet Take 5 mg by mouth daily as needed for moderate constipation.  . Blood Glucose Monitoring Suppl (ACCU-CHEK AVIVA) device USE TO TEST BLOOD GLUCOSE THREE TIMES DAILY  . carvedilol (COREG) 25 MG tablet Take 1 tablet (25 mg total) by mouth 2 (two) times daily with a meal.  . cloNIDine (CATAPRES) 0.1 MG tablet TAKE 1 TABLET AT BEDTIME  . Ferrous Sulfate (IRON) 325 (65 Fe) MG TABS Take 1 tablet by mouth daily.  . furosemide (LASIX) 40 MG tablet Take 1.5 tablets (60 mg) by mouth each morning and one (1) tablet (40 mg) by mouth each evening.  Marland Kitchen glucose blood (ACCU-CHEK AVIVA PLUS) test strip USE TO TEST BLOOD GLUCOSE THREE TIMES DAILY  . hydrALAZINE (APRESOLINE) 25 MG tablet Take 1 tablet (25 mg total) by mouth 3 (three) times daily.  . insulin aspart (NOVOLOG FLEXPEN) 100 UNIT/ML FlexPen Inject 10 Units into the skin 3 (three) times daily with meals. (Patient taking differently: Inject 12 Units into the skin 3 (three) times daily with meals. )  . Insulin Glargine (LANTUS SOLOSTAR) 100 UNIT/ML Solostar Pen Inject 16 Units into the skin daily at 10 pm.    . isosorbide mononitrate (IMDUR) 60 MG 24 hr tablet TAKE 2 TABLETS EVERY DAY  . Lancet Devices (ACCU-CHEK SOFTCLIX) lancets USE TO TEST BLOOD GLUCOSE THREE TIMES DAILY  . nitroGLYCERIN (NITROSTAT) 0.4 MG SL tablet Place 1 tablet (0.4 mg total) under the tongue every 5 (five) minutes as needed for chest pain (3 doses MAX).  Marland Kitchen omeprazole (PRILOSEC) 20 MG capsule TAKE 1 CAPSULE BY MOUTH TWICE DAILY BEFORE MEAL(S)  . pentoxifylline (TRENTAL) 400 MG CR tablet TAKE 1 TABLET TWICE DAILY  AT  10AM  AND  5PM  . polyethylene glycol (MIRALAX / GLYCOLAX) packet Take 17 g by mouth daily as needed for mild constipation or moderate constipation.  . predniSONE (DELTASONE) 10 MG tablet Take 1 tablet (10 mg total) by mouth daily with breakfast.  . timolol (TIMOPTIC) 0.25 % ophthalmic solution   . timolol (TIMOPTIC) 0.5 % ophthalmic solution Place 1 drop into both eyes every morning.  . TRAVATAN Z 0.004 % SOLN ophthalmic solution Place 1 drop into both eyes every evening.   No facility-administered encounter medications on file as of 08/02/2017.     Activities of Daily Living In your present state of health, do you have any difficulty performing the following activities: 08/02/2017  Hearing? N  Vision? N  Difficulty concentrating or making decisions? N  Walking or climbing stairs? N  Dressing or bathing? N  Doing errands, shopping? N  Preparing Food and eating ? N  Using the Toilet? N  In the past six months, have you accidently leaked urine? N  Do you have problems with loss of bowel control? N  Managing your Medications? N  Managing your Finances? N  Housekeeping or managing your Housekeeping? N  Some recent data might be hidden    Patient Care Team: Marletta Lor, MD as PCP - General  Heart Dr[facility] Tamala [facility]; Dr. Claybon Jabs- [facility]  To see heart care; Dr. Tamala Julian July 10th  Assessment:   This is a routine wellness examination for [place].  Exercise Activities and Dietary  recommendations Current Exercise Habits: Home exercise routine;Structured exercise class, Time (Minutes): 60, Frequency (Times/Week): 2, Weekly Exercise (Minutes/Week): 120, Intensity: Moderate  Goals    . Patient Stated     [facility]aint[facility]         Fall Risk Fall Risk  08/02/2017 08/02/2017 07/20/2016 04/22/2015 02/03/2015  Falls in the past year? No No No No Yes  Number falls in past yr: - - - - 1  Injury with Fall? - - - - No  Risk for fall due to : - - - - Other (Comment)  Risk for fall due to: Comment - - - - Accidentially fell  Follow up - - - - Falls prevention discussed     Depression Screen PHQ 2/9 Scores 08/02/2017 08/02/2017 07/20/2016 04/22/2015  PHQ - 2 Score 0 0 0 0    Cognitive Function MMSE - Mini Mental State Exam 08/02/2017 07/20/2016  Not completed: (No Data) (No Data)  Ad8 score reviewed for issues:  Issues making decisions:  Less interest in hobbies / activities:  Repeats questions, stories (family complaining):  Trouble using ordinary gadgets (microwave, computer, phone):  Forgets the month or year:   Mismanaging finances:   Remembering appts:  Daily problems with thinking and/or memory: Ad8 score is=0 Correctly with math 2/2  Recall 2 of 3 No issues managing his finances;  Pays his bills, manages his meds, still drives. He likes his single family home. No failures at independent living     Immunization History  Administered Date(s) Administered  . Influenza Split 01/07/2011, 11/26/2012  . Influenza Whole 02/28/2005, 12/07/2007, 12/19/2008, 11/27/2009  . Influenza, High Dose Seasonal PF 12/28/2015  . Influenza,inj,Quad PF,6+ Mos 11/13/2013, 11/09/2014  . Influenza-Unspecified 11/08/2016  . Pneumococcal Conjugate-13 01/28/2013  . Pneumococcal Polysaccharide-23 02/28/2005  . Tdap 01/13/2012      Screening Tests Health Maintenance  Topic Date Due  . URINE MICROALBUMIN  03/29/2015  . HEMOGLOBIN A1C  06/27/2017  .  INFLUENZA VACCINE  09/28/2017  . OPHTHALMOLOGY EXAM  04/24/2018  . FOOT EXAM  04/27/2018  . TETANUS/TDAP  01/12/2022  . PNA vac Low Risk Adult  Completed         Plan:      PCP Notes   Health Maintenance Health Maintenance Due  Topic Date Due  . URINE MICROALBUMIN  03/29/2015  . HEMOGLOBIN A1C  06/27/2017   Labs to be drawn today UA specimen was obtained  Follows at the [facility] regarding shingrix and has apt with the [place] scheduled   Abnormal Screens  None;  Cooks some; given Glycerna to try and can discuss this or the [place] supplement with his renal doctor.  Referrals  none  Patient concerns; None stated at this time  Nurse Concerns; As noted  Next PCP apt Was seen today for physical    I have personally reviewed and noted the following in the patient's chart:   . Medical and social history . Use of alcohol, tobacco or illicit drugs  . Current medications and supplements . Functional ability and status . Nutritional status . Physical activity . Advanced directives . List of other physicians . Hospitalizations, surgeries, and ER visits in previous 12 months . Vitals . Screenings to include cognitive, depression, and  falls . Referrals and appointments  In addition, I have reviewed and discussed with patient certain preventive protocols, quality metrics, and best practice recommendations. A written personalized care plan for preventive services as well as general preventive health recommendations were provided to patient.     ACGBK,ORJGY, RN  08/02/2017  Patient evaluated by me earlier today for a annual comprehensive examination.  Results of the annual subsequent Medicare wellness visit reviewed and agree with findings  [name]

## 2017-08-02 ENCOUNTER — Ambulatory Visit (INDEPENDENT_AMBULATORY_CARE_PROVIDER_SITE_OTHER): Payer: Medicare HMO | Admitting: Internal Medicine

## 2017-08-02 ENCOUNTER — Ambulatory Visit (INDEPENDENT_AMBULATORY_CARE_PROVIDER_SITE_OTHER): Payer: Medicare HMO

## 2017-08-02 ENCOUNTER — Encounter: Payer: Self-pay | Admitting: Internal Medicine

## 2017-08-02 VITALS — BP 130/68 | HR 58 | Temp 97.8°F | Wt 164.0 lb

## 2017-08-02 VITALS — BP 130/68 | Ht 70.0 in | Wt 166.0 lb

## 2017-08-02 DIAGNOSIS — N189 Chronic kidney disease, unspecified: Secondary | ICD-10-CM

## 2017-08-02 DIAGNOSIS — Z0000 Encounter for general adult medical examination without abnormal findings: Secondary | ICD-10-CM

## 2017-08-02 DIAGNOSIS — I10 Essential (primary) hypertension: Secondary | ICD-10-CM

## 2017-08-02 DIAGNOSIS — I25709 Atherosclerosis of coronary artery bypass graft(s), unspecified, with unspecified angina pectoris: Secondary | ICD-10-CM | POA: Diagnosis not present

## 2017-08-02 DIAGNOSIS — E785 Hyperlipidemia, unspecified: Secondary | ICD-10-CM

## 2017-08-02 DIAGNOSIS — E538 Deficiency of other specified B group vitamins: Secondary | ICD-10-CM | POA: Diagnosis not present

## 2017-08-02 DIAGNOSIS — E0821 Diabetes mellitus due to underlying condition with diabetic nephropathy: Secondary | ICD-10-CM

## 2017-08-02 DIAGNOSIS — N184 Chronic kidney disease, stage 4 (severe): Secondary | ICD-10-CM | POA: Diagnosis not present

## 2017-08-02 LAB — CBC WITH DIFFERENTIAL/PLATELET
BASOS PCT: 0.9 % (ref 0.0–3.0)
Basophils Absolute: 0.1 10*3/uL (ref 0.0–0.1)
EOS PCT: 8.6 % — AB (ref 0.0–5.0)
Eosinophils Absolute: 0.6 10*3/uL (ref 0.0–0.7)
HCT: 34.4 % — ABNORMAL LOW (ref 39.0–52.0)
HEMOGLOBIN: 11.8 g/dL — AB (ref 13.0–17.0)
LYMPHS ABS: 1 10*3/uL (ref 0.7–4.0)
Lymphocytes Relative: 14.9 % (ref 12.0–46.0)
MCHC: 34.4 g/dL (ref 30.0–36.0)
MCV: 94.1 fl (ref 78.0–100.0)
MONOS PCT: 7.6 % (ref 3.0–12.0)
Monocytes Absolute: 0.5 10*3/uL (ref 0.1–1.0)
NEUTROS PCT: 68 % (ref 43.0–77.0)
Neutro Abs: 4.5 10*3/uL (ref 1.4–7.7)
Platelets: 222 10*3/uL (ref 150.0–400.0)
RBC: 3.65 Mil/uL — ABNORMAL LOW (ref 4.22–5.81)
RDW: 15.3 % (ref 11.5–15.5)
WBC: 6.6 10*3/uL (ref 4.0–10.5)

## 2017-08-02 LAB — COMPREHENSIVE METABOLIC PANEL
ALT: 23 U/L (ref 0–53)
AST: 18 U/L (ref 0–37)
Albumin: 4.2 g/dL (ref 3.5–5.2)
Alkaline Phosphatase: 76 U/L (ref 39–117)
BUN: 35 mg/dL — ABNORMAL HIGH (ref 6–23)
CHLORIDE: 104 meq/L (ref 96–112)
CO2: 28 meq/L (ref 19–32)
Calcium: 9.3 mg/dL (ref 8.4–10.5)
Creatinine, Ser: 3.3 mg/dL — ABNORMAL HIGH (ref 0.40–1.50)
GFR: 23.08 mL/min — AB (ref >60.00)
GLUCOSE: 73 mg/dL (ref 70–99)
POTASSIUM: 3.4 meq/L — AB (ref 3.5–5.1)
Sodium: 141 mEq/L (ref 135–145)
Total Bilirubin: 0.5 mg/dL (ref 0.2–1.2)
Total Protein: 6.6 g/dL (ref 6.0–8.3)

## 2017-08-02 LAB — HEMOGLOBIN A1C: Hgb A1c MFr Bld: 7 % — ABNORMAL HIGH (ref 4.6–6.5)

## 2017-08-02 LAB — TSH: TSH: 4.84 u[IU]/mL — ABNORMAL HIGH (ref 0.35–4.50)

## 2017-08-02 LAB — LIPID PANEL
Cholesterol: 103 mg/dL (ref 0–200)
HDL: 23.9 mg/dL — AB (ref >39.00)
LDL CALC: 45 mg/dL (ref 0–99)
NONHDL: 79.59
Total CHOL/HDL Ratio: 4
Triglycerides: 171 mg/dL — ABNORMAL HIGH (ref 0.0–149.0)
VLDL: 34.2 mg/dL (ref 0.0–40.0)

## 2017-08-02 LAB — MICROALBUMIN / CREATININE URINE RATIO
Creatinine,U: 78 mg/dL
Microalb Creat Ratio: 39.5 mg/g — ABNORMAL HIGH (ref 0.0–30.0)
Microalb, Ur: 30.8 mg/dL — ABNORMAL HIGH (ref 0.0–1.9)

## 2017-08-02 MED ORDER — CYANOCOBALAMIN 1000 MCG/ML IJ SOLN
1000.0000 ug | Freq: Once | INTRAMUSCULAR | Status: AC
  Administered 2017-08-02: 1000 ug via INTRAMUSCULAR

## 2017-08-02 NOTE — Progress Notes (Signed)
Subjective:    Patient ID: [name], male    DOB: November 04, 1931, 84 y.o.   MRN: [id number]  HPI  84 year old patient who is seen today for a annual health assessment.  He is scheduled later this afternoon for a annual subsequent Medicare wellness visit He has a history of chronic kidney disease and is followed by nephrology.  He has essential hypertension and coronary artery disease which has been stable.  He is status post CABG. He has diabetes which has been managed with basal bolus insulin therapy  No cardiopulmonary complaints He has B12 deficiency on monthly injections. Remains on statin therapy for dyslipidemia.  Past Medical History:  Diagnosis Date  . Arthritis    "touch in my fingers" (09/10/2014)  . Atrophic gastritis   . AVM (arteriovenous malformation) of duodenum, acquired   . B12 deficiency anemia    "stopped taking the shots in ~ 06/2014" (09/10/2014)  . Barrett's esophagus   . CAD (coronary artery disease)    a. H/o MI in 1998;  b. s/p CABG 2003. c. 10/2014 NSTEMI/Cath: LM 75, LAD 112m/d, D1 95, D2 50, LCX 100ost/p, OM1 80, OM2 80, RCA 100p/m, RPDA fills via L->L collats, LIMA->LAD ok, VG->dRCA 100, VG->OM1->OM2 99 prox to OM1 insertion->recanalated ->Tx with heparin,  VG->D1 100-->Med Rx.  . Cataract   . Chronic systolic CHF (congestive heart failure) (HCC)    a. EF 40-45% in 10/2014 (previously normal in 08/2014).  . CKD (chronic kidney disease), stage IV ([place])   . Diabetes 1.5, managed as type 1 ([place])   . History of stomach ulcers 2013  . Hyperlipidemia   . Hypertension   . Hypertensive heart disease   . Ischemic cardiomyopathy    a. 10/2014 Echo: EF 40-45%.  . Myocardial infarction ([place])   . PVD (peripheral vascular disease) ([place])   . Type II diabetes mellitus ([place])      Social History   Socioeconomic History  . Marital status: Widowed    Spouse name: Not on file  . Number of children: Not on file  . Years of education: Not on file  . Highest  education level: Not on file  Occupational History  . Occupation: Retired  Scientific laboratory technician  . Financial resource strain: Not on file  . Food insecurity:    Worry: Not on file    Inability: Not on file  . Transportation needs:    Medical: Not on file    Non-medical: Not on file  Tobacco Use  . Smoking status: Former Smoker    Packs/day: 0.50    Years: 20.00    Pack years: 10.00    Types: Cigarettes  . Smokeless tobacco: Never Used  . Tobacco comment: "quit smoking cigarettes in the 1960s   Substance and Sexual Activity  . Alcohol use: No  . Drug use: No  . Sexual activity: Not Currently  Lifestyle  . Physical activity:    Days per week: Not on file    Minutes per session: Not on file  . Stress: Not on file  Relationships  . Social connections:    Talks on phone: Not on file    Gets together: Not on file    Attends religious service: Not on file    Active member of club or organization: Not on file    Attends meetings of clubs or organizations: Not on file    Relationship status: Not on file  . Intimate partner violence:    Fear of current or  ex partner: Not on file    Emotionally abused: Not on file    Physically abused: Not on file    Forced sexual activity: Not on file  Other Topics Concern  . Not on file  Social History Narrative   Pt lives alone, family nearby.    Past Surgical History:  Procedure Laterality Date  . CARDIAC CATHETERIZATION  2003;   . CARDIAC CATHETERIZATION N/A 11/07/2014   Left Heart Cath; Belva Crome, MD;   . CATARACT EXTRACTION, BILATERAL Bilateral   . CHOLECYSTECTOMY N/A 05/20/2015   Procedure: LAPAROSCOPIC CHOLECYSTECTOMY;  Surgeon: Coralie Keens, MD;  Location: [place];  Service: General;  Laterality: N/A;  . COLONOSCOPY    . COLONOSCOPY N/A 05/12/2015   Procedure: COLONOSCOPY;  Surgeon: Milus Banister, MD;  Location: [place];  Service: Endoscopy;  Laterality: N/A;  . [place]   [name] 07/13/2010  . CORONARY  ARTERY BYPASS GRAFT  2003   CABG X5  . ESOPHAGOGASTRODUODENOSCOPY  06/10/2011   Procedure: ESOPHAGOGASTRODUODENOSCOPY (EGD);  Surgeon: Ladene Artist, MD,FACG;  Location: [facility] ENDOSCOPY;  Service: Endoscopy;  Laterality: N/A;  . ESOPHAGOGASTRODUODENOSCOPY N/A 10/04/2016   Procedure: ESOPHAGOGASTRODUODENOSCOPY (EGD);  Surgeon: Manus Gunning, MD;  Location[facility];  Service: Gastroenterology;  Laterality: N/A;    Family History  Problem Relation Age of Onset  . Stroke Mother   . Hypertension Mother   . Cancer Mother   . Diabetes Unknown        brothers and sisters  . Coronary artery disease Unknown   . Hypertension Sister   . Heart attack Sister   . Colon cancer Neg Hx     No Known Allergies  Current Outpatient Medications on File Prior to Visit  Medication Sig Dispense Refill  . ACCU-CHEK SOFTCLIX LANCETS lancets CHECK BLOOD GLUCOSE THREE TIMES DAILY 100 each 0  . acetaminophen (TYLENOL) 500 MG tablet Take 500 mg by mouth every 8 (eight) hours as needed for mild pain or headache.    . Alcohol Swabs (ALCOHOL PREP) PADS USE TO TEST BLOOD GLUCOSE THREE TIMES DAILY 300 each 3  . amLODipine (NORVASC) 10 MG tablet TAKE 1 TABLET EVERY DAY 90 tablet 3  . aspirin EC 81 MG tablet Take 81 mg by mouth at bedtime.    Marland Kitchen atorvastatin (LIPITOR) 80 MG tablet TAKE 1 TABLET DAILY AT 6 PM 90 tablet 3  . BD PEN NEEDLE NANO U/F 32G X 4 MM MISC USE TWO TIMES DAILY AS NEEDED. 180 each 4  . bisacodyl (DULCOLAX) 5 MG EC tablet Take 5 mg by mouth daily as needed for moderate constipation.    . Blood Glucose Monitoring Suppl (ACCU-CHEK AVIVA) device USE TO TEST BLOOD GLUCOSE THREE TIMES DAILY 1 each 0  . carvedilol (COREG) 25 MG tablet Take 1 tablet (25 mg total) by mouth 2 (two) times daily with a meal. 180 tablet 3  . cloNIDine (CATAPRES) 0.1 MG tablet TAKE 1 TABLET AT BEDTIME 90 tablet 3  . Ferrous Sulfate (IRON) 325 (65 Fe) MG TABS Take 1 tablet by mouth daily. 30 each 0  . furosemide (LASIX)  40 MG tablet Take 1.5 tablets (60 mg) by mouth each morning and one (1) tablet (40 mg) by mouth each evening. 225 tablet 3  . glucose blood (ACCU-CHEK AVIVA PLUS) test strip USE TO TEST BLOOD GLUCOSE THREE TIMES DAILY 300 each 3  . hydrALAZINE (APRESOLINE) 25 MG tablet Take 1 tablet (25 mg total) by mouth 3 (three) times  daily. 270 tablet 2  . insulin aspart (NOVOLOG FLEXPEN) 100 UNIT/ML FlexPen Inject 10 Units into the skin 3 (three) times daily with meals. (Patient taking differently: Inject 12 Units into the skin 3 (three) times daily with meals. ) 45 mL 3  . Insulin Glargine (LANTUS SOLOSTAR) 100 UNIT/ML Solostar Pen Inject 16 Units into the skin daily at 10 pm. 15 mL 3  . isosorbide mononitrate (IMDUR) 60 MG 24 hr tablet TAKE 2 TABLETS EVERY DAY 90 tablet 1  . Lancet Devices (ACCU-CHEK SOFTCLIX) lancets USE TO TEST BLOOD GLUCOSE THREE TIMES DAILY 1 each 0  . nitroGLYCERIN (NITROSTAT) 0.4 MG SL tablet Place 1 tablet (0.4 mg total) under the tongue every 5 (five) minutes as needed for chest pain (3 doses MAX). 25 tablet 3  . omeprazole (PRILOSEC) 20 MG capsule TAKE 1 CAPSULE BY MOUTH TWICE DAILY BEFORE MEAL(S) 180 capsule 1  . pentoxifylline (TRENTAL) 400 MG CR tablet TAKE 1 TABLET TWICE DAILY  AT  10AM  AND  5PM 180 tablet 3  . polyethylene glycol (MIRALAX / GLYCOLAX) packet Take 17 g by mouth daily as needed for mild constipation or moderate constipation. 30 each 1  . predniSONE (DELTASONE) 10 MG tablet Take 1 tablet (10 mg total) by mouth daily with breakfast. 10 tablet 0  . timolol (TIMOPTIC) 0.5 % ophthalmic solution Place 1 drop into both eyes every morning.    . TRAVATAN Z 0.004 % SOLN ophthalmic solution Place 1 drop into both eyes every evening.    . timolol (TIMOPTIC) 0.25 % ophthalmic solution      No current facility-administered medications on file prior to visit.     BP 130/68 (BP Location: Right Arm, Patient Position: Sitting, Cuff Size: Normal)   Pulse (!) 58   Temp 97.8 F  (36.6 C) (Oral)   Wt 164 lb (74.4 kg)   SpO2 97%   BMI 23.53 kg/m     Review of Systems  Constitutional: Negative for appetite change, chills, fatigue and fever.  HENT: Negative for congestion, dental problem, ear pain, hearing loss, sore throat, tinnitus, trouble swallowing and voice change.   Eyes: Negative for pain, discharge and visual disturbance.  Respiratory: Negative for cough, chest tightness, wheezing and stridor.   Cardiovascular: Negative for chest pain, palpitations and leg swelling.  Gastrointestinal: Negative for abdominal distention, abdominal pain, blood in stool, constipation, diarrhea, nausea and vomiting.  Genitourinary: Negative for difficulty urinating, discharge, flank pain, genital sores, hematuria and urgency.  Musculoskeletal: Negative for arthralgias, back pain, gait problem, joint swelling, myalgias and neck stiffness.  Skin: Negative for rash.  Neurological: Negative for dizziness, syncope, speech difficulty, weakness, numbness and headaches.  Hematological: Negative for adenopathy. Does not bruise/bleed easily.  Psychiatric/Behavioral: Negative for behavioral problems and dysphoric mood. The patient is not nervous/anxious.        Objective:   Physical Exam  Constitutional: He is oriented to person, place, and time. He appears well-developed.  Blood pressure 120/60  HENT:  Head: Normocephalic.  Right Ear: External ear normal.  Left Ear: External ear normal.  Arcus senilis  Eyes: Conjunctivae and EOM are normal.  Neck: Normal range of motion.  Cardiovascular: Normal rate and normal heart sounds.  Pedal pulses absent Left femoral bruit Left carotid bruit  Pulmonary/Chest: Breath sounds normal.  Status post CABG  Abdominal: Bowel sounds are normal.  Musculoskeletal: Normal range of motion. He exhibits no edema or tenderness.  Neurological: He is alert and oriented to person, place, and time.  Psychiatric: He has a normal mood and affect. His  behavior is normal.          Assessment & Plan:  Preventive health examination  Coronary artery disease stable.  Continue efforts at aggressive risk factor modification Diabetes mellitus.  Will review a hemoglobin A1c urine for microalbumin Chronic kidney disease.  Follow-up nephrology Hyperuricemia/history of gout.  Patient has had only a single gouty attack.  Continue low purine diet PAD stable Essential hypertension well-controlled   nephrology follow-up as scheduled Continue annual on examination.  Last exam 2 months ago  Follow-up 3 months Review updated lab  [name]

## 2017-08-02 NOTE — Patient Instructions (Signed)
Please check your hemoglobin A1c every 3 months  Limit your sodium (Salt) intake  Please check your blood pressure on a regular basis.  If it is consistently greater than 150/90, please make an office appointment.  Return in 3 months for follow-up  Please see your eye doctor yearly to check for diabetic eye damage

## 2017-08-02 NOTE — Patient Instructions (Addendum)
[name] , Thank you for taking time to come for your Medicare Wellness Visit. I appreciate your ongoing commitment to your health goals. Please review the following plan we discussed and let me know if I can assist you in the future.   Deaf & Hard of Hearing Division Services - can assist with hearing aid x 1  No reviews  [employer] Office  [address]  [phone]  [email or web address]_How_to_Buy_Hearing_[email or web address]  These are the goals we discussed: Goals    . Patient Stated     Maintain your health; continue at the [facility]         This is a list of the screening recommended for you and due dates:  Health Maintenance  Topic Date Due  . Urine Protein Check  03/29/2015  . Hemoglobin A1C  06/27/2017  . Flu Shot  09/28/2017  . Eye exam for diabetics  04/24/2018  . Complete foot exam   04/27/2018  . Tetanus Vaccine  01/12/2022  . Pneumonia vaccines  Completed      Fall Prevention in the Home Falls can cause injuries. They can happen to people of all ages. There are many things you can do to make your home safe and to help prevent falls. What can I do on the outside of my home?  Regularly fix the edges of walkways and driveways and fix any cracks.  Remove anything that might make you trip as you walk through a door, such as a raised step or threshold.  Trim any bushes or trees on the path to your home.  Use bright outdoor lighting.  Clear any walking paths of anything that might make someone trip, such as rocks or tools.  Regularly check to see if handrails are loose or broken. Make sure that both sides of any steps have handrails.  Any raised decks and porches should have guardrails on the edges.  Have any leaves, snow, or ice cleared regularly.  Use sand or salt on walking paths during winter.  Clean up any spills in your garage right away. This includes oil or grease spills. What can I  do in the bathroom?  Use night lights.  Install grab bars by the toilet and in the tub and shower. Do not use towel bars as grab bars.  Use non-skid mats or decals in the tub or shower.  If you need to sit down in the shower, use a plastic, non-slip stool.  Keep the floor dry. Clean up any water that spills on the floor as soon as it happens.  Remove soap buildup in the tub or shower regularly.  Attach bath mats securely with double-sided non-slip rug tape.  Do not have throw rugs and other things on the floor that can make you trip. What can I do in the bedroom?  Use night lights.  Make sure that you have a light by your bed that is easy to reach.  Do not use any sheets or blankets that are too big for your bed. They should not hang down onto the floor.  Have a firm chair that has side arms. You can use this for support while you get dressed.  Do not have throw rugs and other things on the floor that can make you trip. What can I do in the kitchen?  Clean up any spills right away.  Avoid walking on wet floors.  Keep items that you use a lot in easy-to-reach places.  If you need to  reach something above you, use a strong step stool that has a grab bar.  Keep electrical cords out of the way.  Do not use floor polish or wax that makes floors slippery. If you must use wax, use non-skid floor wax.  Do not have throw rugs and other things on the floor that can make you trip. What can I do with my stairs?  Do not leave any items on the stairs.  Make sure that there are handrails on both sides of the stairs and use them. Fix handrails that are broken or loose. Make sure that handrails are as long as the stairways.  Check any carpeting to make sure that it is firmly attached to the stairs. Fix any carpet that is loose or worn.  Avoid having throw rugs at the top or bottom of the stairs. If you do have throw rugs, attach them to the floor with carpet tape.  Make sure that  you have a light switch at the top of the stairs and the bottom of the stairs. If you do not have them, ask someone to add them for you. What else can I do to help prevent falls?  Wear shoes that: ? Do not have high heels. ? Have rubber bottoms. ? Are comfortable and fit you well. ? Are closed at the toe. Do not wear sandals.  If you use a stepladder: ? Make sure that it is fully opened. Do not climb a closed stepladder. ? Make sure that both sides of the stepladder are locked into place. ? Ask someone to hold it for you, if possible.  Clearly mark and make sure that you can see: ? Any grab bars or handrails. ? First and last steps. ? Where the edge of each step is.  Use tools that help you move around (mobility aids) if they are needed. These include: ? Canes. ? Walkers. ? Scooters. ? Crutches.  Turn on the lights when you go into a dark area. Replace any light bulbs as soon as they burn out.  Set up your furniture so you have a clear path. Avoid moving your furniture around.  If any of your floors are uneven, fix them.  If there are any pets around you, be aware of where they are.  Review your medicines with your doctor. Some medicines can make you feel dizzy. This can increase your chance of falling. Ask your doctor what other things that you can do to help prevent falls. This information is not intended to replace advice given to you by your health care provider. Make sure you discuss any questions you have with your health care provider. Document Released: 12/11/2008 Document Revised: 07/23/2015 Document Reviewed: 03/21/2014 Elsevier Interactive Patient Education  2018 [place] Maintenance, Male A healthy lifestyle and preventive care is important for your health and wellness. Ask your health care provider about what schedule of regular examinations is right for you. What should I know about weight and diet? Eat a Healthy Diet  Eat plenty of  vegetables, fruits, whole grains, low-fat dairy products, and lean protein.  Do not eat a lot of foods high in solid fats, added sugars, or salt.  Maintain a Healthy Weight Regular exercise can help you achieve or maintain a healthy weight. You should:  Do at least 150 minutes of exercise each week. The exercise should increase your heart rate and make you sweat (moderate-intensity exercise).  Do strength-training exercises at least twice a week.  Watch Your Levels of Cholesterol and Blood Lipids  Have your blood tested for lipids and cholesterol every 5 years starting at 82 years of age. If you are at high risk for heart disease, you should start having your blood tested when you are 82 years old. You may need to have your cholesterol levels checked more often if: ? Your lipid or cholesterol levels are high. ? You are older than 82 years of age. ? You are at high risk for heart disease.  What should I know about cancer screening? Many types of cancers can be detected early and may often be prevented. Lung Cancer  You should be screened every year for lung cancer if: ? You are a current smoker who has smoked for at least 30 years. ? You are a former smoker who has quit within the past 15 years.  Talk to your health care provider about your screening options, when you should start screening, and how often you should be screened.  Colorectal Cancer  Routine colorectal cancer screening usually begins at 82 years of age and should be repeated every 5-10 years until you are 82 years old. You may need to be screened more often if early forms of precancerous polyps or small growths are found. Your health care provider may recommend screening at an earlier age if you have risk factors for colon cancer.  Your health care provider may recommend using home test kits to check for hidden blood in the stool.  A small camera at the end of a tube can be used to examine your colon (sigmoidoscopy or  colonoscopy). This checks for the earliest forms of colorectal cancer.  Prostate and Testicular Cancer  Depending on your age and overall health, your health care provider may do certain tests to screen for prostate and testicular cancer.  Talk to your health care provider about any symptoms or concerns you have about testicular or prostate cancer.  Skin Cancer  Check your skin from head to toe regularly.  Tell your health care provider about any new moles or changes in moles, especially if: ? There is a change in a mole's size, shape, or color. ? You have a mole that is larger than a pencil eraser.  Always use sunscreen. Apply sunscreen liberally and repeat throughout the day.  Protect yourself by wearing long sleeves, pants, a wide-brimmed hat, and sunglasses when outside.  What should I know about heart disease, diabetes, and high blood pressure?  If you are 64-70 years of age, have your blood pressure checked every 3-5 years. If you are 64 years of age or older, have your blood pressure checked every year. You should have your blood pressure measured twice-once when you are at a hospital or clinic, and once when you are not at a hospital or clinic. Record the average of the two measurements. To check your blood pressure when you are not at a hospital or clinic, you can use: ? An automated blood pressure machine at a pharmacy. ? A home blood pressure monitor.  Talk to your health care provider about your target blood pressure.  If you are between 28-25 years old, ask your health care provider if you should take aspirin to prevent heart disease.  Have regular diabetes screenings by checking your fasting blood sugar level. ? If you are at a normal weight and have a low risk for diabetes, have this test once every three years after the age of 30. ? If you are  overweight and have a high risk for diabetes, consider being tested at a younger age or more often.  A one-time screening for  abdominal aortic aneurysm (AAA) by ultrasound is recommended for men aged 67-75 years who are current or former smokers. What should I know about preventing infection? Hepatitis B If you have a higher risk for hepatitis B, you should be screened for this virus. Talk with your health care provider to find out if you are at risk for hepatitis B infection. Hepatitis C Blood testing is recommended for:  Everyone born from [birth year] through 1963.  Anyone with known risk factors for hepatitis C.  Sexually Transmitted Diseases (STDs)  You should be screened each year for STDs including gonorrhea and chlamydia if: ? You are sexually active and are younger than 82 years of age. ? You are older than 82 years of age and your health care provider tells you that you are at risk for this type of infection. ? Your sexual activity has changed since you were last screened and you are at an increased risk for chlamydia or gonorrhea. Ask your health care provider if you are at risk.  Talk with your health care provider about whether you are at high risk of being infected with HIV. Your health care provider may recommend a prescription medicine to help prevent HIV infection.  What else can I do?  Schedule regular health, dental, and eye exams.  Stay current with your vaccines (immunizations).  Do not use any tobacco products, such as cigarettes, chewing tobacco, and e-cigarettes. If you need help quitting, ask your health care provider.  Limit alcohol intake to no more than 2 drinks per day. One drink equals 12 ounces of beer, 5 ounces of wine, or 1 ounces of hard liquor.  Do not use street drugs.  Do not share needles.  Ask your health care provider for help if you need support or information about quitting drugs.  Tell your health care provider if you often feel depressed.  Tell your health care provider if you have ever been abused or do not feel safe at home. This information is not intended to  replace advice given to you by your health care provider. Make sure you discuss any questions you have with your health care provider. Document Released: 08/13/2007 Document Revised: 10/14/2015 Document Reviewed: 11/18/2014 Elsevier Interactive Patient Education  [employer].

## 2017-08-15 DIAGNOSIS — N189 Chronic kidney disease, unspecified: Secondary | ICD-10-CM | POA: Diagnosis not present

## 2017-08-15 DIAGNOSIS — N184 Chronic kidney disease, stage 4 (severe): Secondary | ICD-10-CM | POA: Diagnosis not present

## 2017-08-15 DIAGNOSIS — N2581 Secondary hyperparathyroidism of renal origin: Secondary | ICD-10-CM | POA: Diagnosis not present

## 2017-08-19 ENCOUNTER — Other Ambulatory Visit: Payer: Self-pay | Admitting: Internal Medicine

## 2017-08-23 DIAGNOSIS — E611 Iron deficiency: Secondary | ICD-10-CM | POA: Diagnosis not present

## 2017-08-23 DIAGNOSIS — I129 Hypertensive chronic kidney disease with stage 1 through stage 4 chronic kidney disease, or unspecified chronic kidney disease: Secondary | ICD-10-CM | POA: Diagnosis not present

## 2017-08-23 DIAGNOSIS — N184 Chronic kidney disease, stage 4 (severe): Secondary | ICD-10-CM | POA: Diagnosis not present

## 2017-08-23 DIAGNOSIS — D631 Anemia in chronic kidney disease: Secondary | ICD-10-CM | POA: Diagnosis not present

## 2017-08-23 DIAGNOSIS — N2581 Secondary hyperparathyroidism of renal origin: Secondary | ICD-10-CM | POA: Diagnosis not present

## 2017-08-23 NOTE — Telephone Encounter (Signed)
Sent to the pharmacy by e-scribe. 

## 2017-08-28 ENCOUNTER — Encounter: Payer: Self-pay | Admitting: Interventional Cardiology

## 2017-08-30 ENCOUNTER — Ambulatory Visit: Payer: Medicare HMO

## 2017-09-06 ENCOUNTER — Ambulatory Visit: Payer: Medicare HMO | Admitting: Interventional Cardiology

## 2017-09-06 ENCOUNTER — Encounter (INDEPENDENT_AMBULATORY_CARE_PROVIDER_SITE_OTHER): Payer: Self-pay

## 2017-09-06 ENCOUNTER — Encounter: Payer: Self-pay | Admitting: Interventional Cardiology

## 2017-09-06 ENCOUNTER — Other Ambulatory Visit: Payer: Self-pay | Admitting: Interventional Cardiology

## 2017-09-06 ENCOUNTER — Ambulatory Visit (INDEPENDENT_AMBULATORY_CARE_PROVIDER_SITE_OTHER): Payer: Medicare HMO | Admitting: *Deleted

## 2017-09-06 VITALS — BP 132/66 | HR 63 | Ht 70.0 in | Wt 163.8 lb

## 2017-09-06 DIAGNOSIS — I10 Essential (primary) hypertension: Secondary | ICD-10-CM

## 2017-09-06 DIAGNOSIS — N184 Chronic kidney disease, stage 4 (severe): Secondary | ICD-10-CM | POA: Diagnosis not present

## 2017-09-06 DIAGNOSIS — I5042 Chronic combined systolic (congestive) and diastolic (congestive) heart failure: Secondary | ICD-10-CM

## 2017-09-06 DIAGNOSIS — E785 Hyperlipidemia, unspecified: Secondary | ICD-10-CM | POA: Diagnosis not present

## 2017-09-06 DIAGNOSIS — I739 Peripheral vascular disease, unspecified: Secondary | ICD-10-CM

## 2017-09-06 DIAGNOSIS — I25709 Atherosclerosis of coronary artery bypass graft(s), unspecified, with unspecified angina pectoris: Secondary | ICD-10-CM

## 2017-09-06 DIAGNOSIS — E538 Deficiency of other specified B group vitamins: Secondary | ICD-10-CM | POA: Diagnosis not present

## 2017-09-06 MED ORDER — CYANOCOBALAMIN 1000 MCG/ML IJ SOLN
1000.0000 ug | Freq: Once | INTRAMUSCULAR | Status: AC
  Administered 2017-09-06: 1000 ug via INTRAMUSCULAR

## 2017-09-06 NOTE — Progress Notes (Signed)
Cardiology Office Note    Date:  09/06/2017   ID:  [name], DOB 09/26/1931, MRN [id number]  PCP:  Brandon Lor, MD  Cardiologist: [name], MD   Chief Complaint  Patient presents with  . Coronary Artery Disease  . Congestive Heart Failure    History of Present Illness:  [name] is a 84 y.o. male who presents for follow-up ofCAD, bypass graft failure with only remaining conduit LIMA to LAD by cath in September 2016, chronic kidney disease stage IV, diabetes mellitus, chronic systolic heart failure with LVEF 40%, peripheral arterial disease, hypertension, and hyperlipidemia. Recent history of stomach ulcers with GI bleeding while on Plavix.  [name] is doing well.  He denies significant angina.  His major complaint is exertional dyspnea.  He denies orthopnea.  He has not needed to use nitroglycerin.  He still exercises but states that he feels himself slowing down.   Past Medical History:  Diagnosis Date  . Arthritis    "touch in my fingers" (09/10/2014)  . Atrophic gastritis   . AVM (arteriovenous malformation) of duodenum, acquired   . B12 deficiency anemia    "stopped taking the shots in ~ 06/2014" (09/10/2014)  . Barrett's esophagus   . CAD (coronary artery disease)    a. H/o MI in 1998;  b. s/p CABG 2003. c. 10/2014 NSTEMI/Cath: LM 75, LAD 118m/d, D1 95, D2 50, LCX 100ost/p, OM1 80, OM2 80, RCA 100p/m, RPDA fills via L->L collats, LIMA->LAD ok, VG->dRCA 100, VG->OM1->OM2 99 prox to OM1 insertion->recanalated ->Tx with heparin,  VG->D1 100-->Med Rx.  . Cataract   . Chronic systolic CHF (congestive heart failure) (HCC)    a. EF 40-45% in 10/2014 (previously normal in 08/2014).  . CKD (chronic kidney disease), stage IV ([place])   . Diabetes 1.5, managed as type 1 ([place])   . History of stomach ulcers 2013  . Hyperlipidemia   . Hypertension   . Hypertensive heart disease   . Ischemic cardiomyopathy    a. 10/2014 Echo: EF 40-45%.  .  Myocardial infarction ([place])   . PVD (peripheral vascular disease) ([place])   . Type II diabetes mellitus ([place])     Past Surgical History:  Procedure Laterality Date  . CARDIAC CATHETERIZATION  2003;   . CARDIAC CATHETERIZATION N/A 11/07/2014   Left Heart Cath; Belva Crome, MD;   . CATARACT EXTRACTION, BILATERAL Bilateral   . CHOLECYSTECTOMY N/A 05/20/2015   Procedure: LAPAROSCOPIC CHOLECYSTECTOMY;  Surgeon: Coralie Keens, MD;  Location: [facility];  Service: General;  Laterality: N/A;  . COLONOSCOPY    . COLONOSCOPY N/A 05/12/2015   Procedure: COLONOSCOPY;  Surgeon: Milus Banister, MD;  Location: [facility];  Service: Endoscopy;  Laterality: N/A;  . [place]   [name] 07/13/2010  . CORONARY ARTERY BYPASS GRAFT  2003   CABG X5  . ESOPHAGOGASTRODUODENOSCOPY  06/10/2011   Procedure: ESOPHAGOGASTRODUODENOSCOPY (EGD);  Surgeon: Ladene Artist, MD,FACG;  Location: [facility] ENDOSCOPY;  Service: Endoscopy;  Laterality: N/A;  . ESOPHAGOGASTRODUODENOSCOPY N/A 10/04/2016   Procedure: ESOPHAGOGASTRODUODENOSCOPY (EGD);  Surgeon: Manus Gunning, MD;  Location: [facility];  Service: Gastroenterology;  Laterality: N/A;    Current Medications: Outpatient Medications Prior to Visit  Medication Sig Dispense Refill  . ACCU-CHEK SOFTCLIX LANCETS lancets CHECK BLOOD GLUCOSE THREE TIMES DAILY 100 each 0  . acetaminophen (TYLENOL) 500 MG tablet Take 500 mg by mouth every 8 (eight) hours as needed for mild pain or headache.    Marland Kitchen  Alcohol Swabs (ALCOHOL PREP) PADS USE TO TEST BLOOD GLUCOSE THREE TIMES DAILY 300 each 3  . amLODipine (NORVASC) 10 MG tablet Take 10 mg by mouth daily.    Marland Kitchen aspirin EC 81 MG tablet Take 81 mg by mouth at bedtime.    Marland Kitchen atorvastatin (LIPITOR) 80 MG tablet Take 80 mg by mouth daily at 6 PM.    . BD PEN NEEDLE NANO U/F 32G X 4 MM MISC INJECT TWO TIMES DAILY AS NEEDED. 180 each 3  . bisacodyl (DULCOLAX) 5 MG EC tablet Take 5 mg by mouth daily as needed for moderate  constipation.    . Blood Glucose Monitoring Suppl (ACCU-CHEK AVIVA) device USE TO TEST BLOOD GLUCOSE THREE TIMES DAILY 1 each 0  . carvedilol (COREG) 25 MG tablet Take 1 tablet (25 mg total) by mouth 2 (two) times daily with a meal. 180 tablet 3  . cloNIDine (CATAPRES) 0.1 MG tablet Take 0.1 mg by mouth at bedtime.    . Ferrous Sulfate (IRON) 325 (65 Fe) MG TABS Take 1 tablet by mouth daily. 30 each 0  . furosemide (LASIX) 40 MG tablet Take 1.5 tablets (60 mg) by mouth each morning and one (1) tablet (40 mg) by mouth each evening. 225 tablet 3  . glucose blood (ACCU-CHEK AVIVA PLUS) test strip USE TO TEST BLOOD GLUCOSE THREE TIMES DAILY 300 each 3  . hydrALAZINE (APRESOLINE) 25 MG tablet Take 1 tablet (25 mg total) by mouth 3 (three) times daily. 270 tablet 2  . insulin aspart (NOVOLOG FLEXPEN) 100 UNIT/ML FlexPen Inject 14 Units into the skin 3 (three) times daily with meals.    . Insulin Glargine (LANTUS SOLOSTAR) 100 UNIT/ML Solostar Pen Inject 16 Units into the skin daily at 10 pm. 15 mL 3  . isosorbide mononitrate (IMDUR) 60 MG 24 hr tablet Take 120 mg by mouth daily.    [name] Devices (ACCU-CHEK SOFTCLIX) lancets USE TO TEST BLOOD GLUCOSE THREE TIMES DAILY 1 each 0  . latanoprost (XALATAN) 0.005 % ophthalmic solution Place 1 drop into both eyes at bedtime.    . nitroGLYCERIN (NITROSTAT) 0.4 MG SL tablet Place 1 tablet (0.4 mg total) under the tongue every 5 (five) minutes as needed for chest pain (3 doses MAX). 25 tablet 3  . omeprazole (PRILOSEC) 20 MG capsule TAKE 1 CAPSULE BY MOUTH TWICE DAILY BEFORE MEAL(S) 180 capsule 1  . pentoxifylline (TRENTAL) 400 MG CR tablet Take 400 mg by mouth 2 (two) times daily. (Take at 10 am and 5 pm)    . polyethylene glycol (MIRALAX / GLYCOLAX) packet Take 17 g by mouth daily as needed for mild constipation or moderate constipation. 30 each 1  . predniSONE (DELTASONE) 10 MG tablet Take 1 tablet (10 mg total) by mouth daily with breakfast. 10 tablet 0  .  timolol (TIMOPTIC) 0.5 % ophthalmic solution Place 1 drop into both eyes every morning.    . TRAVATAN Z 0.004 % SOLN ophthalmic solution Place 1 drop into both eyes every evening.    . timolol (TIMOPTIC) 0.25 % ophthalmic solution     . amLODipine (NORVASC) 10 MG tablet TAKE 1 TABLET EVERY DAY (Patient not taking: Reported on 09/06/2017) 90 tablet 3  . atorvastatin (LIPITOR) 80 MG tablet TAKE 1 TABLET DAILY AT 6 PM (Patient not taking: Reported on 09/06/2017) 90 tablet 3  . cloNIDine (CATAPRES) 0.1 MG tablet TAKE 1 TABLET AT BEDTIME (Patient not taking: Reported on 09/06/2017) 90 tablet 3  . insulin aspart (  NOVOLOG FLEXPEN) 100 UNIT/ML FlexPen Inject 10 Units into the skin 3 (three) times daily with meals. (Patient not taking: Reported on 09/06/2017) 45 mL 3  . isosorbide mononitrate (IMDUR) 60 MG 24 hr tablet TAKE 2 TABLETS EVERY DAY (Patient not taking: Reported on 09/06/2017) 90 tablet 1  . pentoxifylline (TRENTAL) 400 MG CR tablet TAKE 1 TABLET TWICE DAILY  AT  10AM  AND  5PM (Patient not taking: Reported on 09/06/2017) 180 tablet 3   No facility-administered medications prior to visit.      Allergies:   Patient has no known allergies.   Social History   Socioeconomic History  . Marital status: Widowed    Spouse name: Not on file  . Number of children: Not on file  . Years of education: Not on file  . Highest education level: Not on file  Occupational History  . Occupation: Retired  Scientific laboratory technician  . Financial resource strain: Not on file  . Food insecurity:    Worry: Not on file    Inability: Not on file  . Transportation needs:    Medical: Not on file    Non-medical: Not on file  Tobacco Use  . Smoking status: Former Smoker    Packs/day: 0.50    Years: 20.00    Pack years: 10.00    Types: Cigarettes  . Smokeless tobacco: Never Used  . Tobacco comment: "quit smoking cigarettes in the 1960s   Substance and Sexual Activity  . Alcohol use: No  . Drug use: No  . Sexual activity:  Not Currently  Lifestyle  . Physical activity:    Days per week: Not on file    Minutes per session: Not on file  . Stress: Not on file  Relationships  . Social connections:    Talks on phone: Not on file    Gets together: Not on file    Attends religious service: Not on file    Active member of club or organization: Not on file    Attends meetings of clubs or organizations: Not on file    Relationship status: Not on file  Other Topics Concern  . Not on file  Social History Narrative   Pt lives alone, family nearby.     Family History:  The patient's family history includes Cancer in his mother; Coronary artery disease in his unknown relative; Diabetes in his unknown relative; Heart attack in his sister; Hypertension in his mother and sister; Stroke in his mother.   ROS:   Please see the history of present illness.    Still exercises at the [facility].  Do stationary biking, aerobic equipment and isometrics. All other systems reviewed and are negative.   PHYSICAL EXAM:   VS:  BP 132/66   Pulse 63   Ht 5\' 10"  (1.778 m)   Wt 163 lb 12.8 oz (74.3 kg)   BMI 23.50 kg/m    GEN: Well nourished, well developed, in no acute distress  HEENT: normal  Neck: no JVD, carotid bruits, or masses Cardiac: RRR; no murmurs, rubs, or gallops,no edema  Respiratory:  clear to auscultation bilaterally, normal work of breathing GI: soft, nontender, nondistended, + BS MS: no deformity or atrophy  Skin: warm and dry, no rash Neuro:  Alert and Oriented x 3, Strength and sensation are intact Psych: euthymic mood, full affect  Wt Readings from Last 3 Encounters:  09/06/17 163 lb 12.8 oz (74.3 kg)  08/02/17 166 lb (75.3 kg)  08/02/17 164 lb (74.4 kg)  Studies/Labs Reviewed:   EKG:  EKG normal sinus rhythm left axis deviation, prominent voltage consistent with LVH and when compared to prior tracings from 2018 no significant change has occurred.  Recent Labs: 08/02/2017: ALT 23; BUN 35;  Creatinine, Ser 3.30; Hemoglobin 11.8; Platelets 222.0; Potassium 3.4; Sodium 141; TSH 4.84   Lipid Panel    Component Value Date/Time   CHOL 103 08/02/2017 1352   TRIG 171.0 (H) 08/02/2017 1352   HDL 23.90 (L) 08/02/2017 1352   CHOLHDL 4 08/02/2017 1352   VLDL 34.2 08/02/2017 1352   LDLCALC 45 08/02/2017 1352   LDLDIRECT 93.3 01/22/2013 0854    Additional studies/ records that were reviewed today include:  Data    ASSESSMENT:    1. Coronary artery disease involving 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 ([place])   2. CKD (chronic kidney disease), stage IV ([place])   3. Chronic combined systolic and diastolic heart failure ([place])   4. Essential hypertension   5. Dyslipidemia   6. Peripheral vascular disease ([place])      PLAN:  In order of problems listed above:  1. No significant change in anginal pattern. 2. Stable without significant change in laboratory data. 3. No evidence of volume overload. 4. Excellent blood pressure control.  Target less than 130/80 mmHg. 5. LDL target less than 70.  Most recent 4.  This is from June 2019.  Continue with moderate aerobic activity.  Call if angina or swelling.  No change in the current medical regimen.  Clinical follow-up in 1 year.   Medication Adjustments/Labs and Tests Ordered: Current medicines are reviewed at length with the patient today.  Concerns regarding medicines are outlined above.  Medication changes, Labs and Tests ordered today are listed in the Patient Instructions below. Patient Instructions  Medication Instructions:  Your physician recommends that you continue on your current medications as directed. Please refer to the Current Medication list given to you today.  Labwork: None  Testing/Procedures: None  Follow-Up: Your physician wants you to follow-up in: 1 year with Dr. Tamala Julian.  You will receive a reminder letter in the mail two months in advance. If you don't receive a letter, please call  our office to schedule the follow-up appointment.   Any Other Special Instructions Will Be Listed Below (If Applicable).     If you need a refill on your cardiac medications before your next appointment, please call your pharmacy.      Signed, [name], MD  09/06/2017 3:49 PM    [facility] [address], [place], [place]  [address] Phone: [phone]; Fax: [phone]

## 2017-09-06 NOTE — Progress Notes (Signed)
Per orders of Dr. Kwiatkowski, injection of B12 given by Dearion Huot J Deyon Chizek. Patient tolerated injection well. 

## 2017-09-06 NOTE — Patient Instructions (Signed)

## 2017-09-07 NOTE — Addendum Note (Signed)
Addended by: Velna Ochs on: 09/07/2017 04:35 PM   Modules accepted: Orders

## 2017-09-18 ENCOUNTER — Other Ambulatory Visit: Payer: Self-pay | Admitting: Internal Medicine

## 2017-09-18 NOTE — Telephone Encounter (Signed)
Historical med Last OV 08/02/17 Ok to fill?

## 2017-10-04 ENCOUNTER — Ambulatory Visit (INDEPENDENT_AMBULATORY_CARE_PROVIDER_SITE_OTHER): Payer: Medicare HMO | Admitting: Family Medicine

## 2017-10-04 DIAGNOSIS — E538 Deficiency of other specified B group vitamins: Secondary | ICD-10-CM

## 2017-10-04 MED ORDER — CYANOCOBALAMIN 1000 MCG/ML IJ SOLN
1000.0000 ug | Freq: Once | INTRAMUSCULAR | Status: AC
  Administered 2017-10-04: 1000 ug via INTRAMUSCULAR

## 2017-10-04 NOTE — Progress Notes (Signed)
Per orders of Dr. Burnice Logan injection of Vitamin B 12 given by Aggie Hacker ANN. Patient tolerated injection well.

## 2017-10-11 DIAGNOSIS — H401132 Primary open-angle glaucoma, bilateral, moderate stage: Secondary | ICD-10-CM | POA: Diagnosis not present

## 2017-10-16 ENCOUNTER — Other Ambulatory Visit: Payer: Self-pay | Admitting: Interventional Cardiology

## 2017-10-23 ENCOUNTER — Other Ambulatory Visit: Payer: Self-pay | Admitting: Interventional Cardiology

## 2017-10-23 ENCOUNTER — Other Ambulatory Visit: Payer: Self-pay | Admitting: Internal Medicine

## 2017-11-02 ENCOUNTER — Encounter: Payer: Self-pay | Admitting: Internal Medicine

## 2017-11-02 ENCOUNTER — Ambulatory Visit (INDEPENDENT_AMBULATORY_CARE_PROVIDER_SITE_OTHER): Payer: Medicare HMO | Admitting: Internal Medicine

## 2017-11-02 VITALS — BP 140/70 | HR 55 | Temp 98.0°F | Wt 164.6 lb

## 2017-11-02 DIAGNOSIS — I5042 Chronic combined systolic (congestive) and diastolic (congestive) heart failure: Secondary | ICD-10-CM | POA: Diagnosis not present

## 2017-11-02 DIAGNOSIS — Z794 Long term (current) use of insulin: Secondary | ICD-10-CM

## 2017-11-02 DIAGNOSIS — E538 Deficiency of other specified B group vitamins: Secondary | ICD-10-CM

## 2017-11-02 DIAGNOSIS — I10 Essential (primary) hypertension: Secondary | ICD-10-CM

## 2017-11-02 DIAGNOSIS — E119 Type 2 diabetes mellitus without complications: Secondary | ICD-10-CM | POA: Diagnosis not present

## 2017-11-02 DIAGNOSIS — N184 Chronic kidney disease, stage 4 (severe): Secondary | ICD-10-CM | POA: Diagnosis not present

## 2017-11-02 DIAGNOSIS — E0821 Diabetes mellitus due to underlying condition with diabetic nephropathy: Secondary | ICD-10-CM

## 2017-11-02 LAB — POCT GLYCOSYLATED HEMOGLOBIN (HGB A1C): Hemoglobin A1C: 6.3 % — AB (ref 4.0–5.6)

## 2017-11-02 MED ORDER — INSULIN GLARGINE 100 UNIT/ML SOLOSTAR PEN: 12.0000 [IU] | PEN_INJECTOR | Freq: Every day | SUBCUTANEOUS | 3 refills | Status: DC

## 2017-11-02 MED ORDER — CYANOCOBALAMIN 1000 MCG/ML IJ SOLN
1000.0000 ug | Freq: Once | INTRAMUSCULAR | Status: AC
  Administered 2017-11-02: 1000 ug via INTRAMUSCULAR

## 2017-11-02 NOTE — Progress Notes (Signed)
Subjective:    Patient ID: [name], male    DOB: 01/26/1932, 84 y.o.   MRN: [id number]  HPI  Lab Results  Component Value Date   HGBA1C 7.0 (H) 08/02/2017   Wt Readings from Last 3 Encounters:  09/06/17 163 lb 12.8 oz (74.3 kg)  08/02/17 166 lb (75.3 kg)  08/02/17 164 lb (74.4 kg)    BP Readings from Last 3 Encounters:  09/06/17 132/66  08/02/17 130/68  08/02/17 31/88   84 year old patient who is seen today for follow-up of diabetes.  He remains on mealtime insulin and continues to do well.  Remains on basal insulin 16 units at bedtime;  no hypoglycemia.  He states fasting blood sugar earlier today 115  POC hemoglobin A1c here today 6.3  Cardiopulmonary status appears to be stable.  He states he walks about 1/2 mile daily without difficulty.  He has been seen by cardiology recently.  He has coronary artery disease and chronic systolic heart failure which has been stable. He has PAD.  Denies any claudication  History of B12 deficiency  Past Medical History:  Diagnosis Date  . Arthritis    "touch in my fingers" (09/10/2014)  . Atrophic gastritis   . AVM (arteriovenous malformation) of duodenum, acquired   . B12 deficiency anemia    "stopped taking the shots in ~ 06/2014" (09/10/2014)  . Barrett's esophagus   . CAD (coronary artery disease)    a. H/o MI in 1998;  b. s/p CABG 2003. c. 10/2014 NSTEMI/Cath: LM 75, LAD 148m/d, D1 95, D2 50, LCX 100ost/p, OM1 80, OM2 80, RCA 100p/m, RPDA fills via L->L collats, LIMA->LAD ok, VG->dRCA 100, VG->OM1->OM2 99 prox to OM1 insertion->recanalated ->Tx with heparin,  VG->D1 100-->Med Rx.  . Cataract   . Chronic systolic CHF (congestive heart failure) (HCC)    a. EF 40-45% in 10/2014 (previously normal in 08/2014).  . CKD (chronic kidney disease), stage IV ([place])   . Diabetes 1.5, managed as type 1 ([place])   . History of stomach ulcers 2013  . Hyperlipidemia   . Hypertension   . Hypertensive heart disease   . Ischemic  cardiomyopathy    a. 10/2014 Echo: EF 40-45%.  . Myocardial infarction ([place])   . PVD (peripheral vascular disease) ([place])   . Type II diabetes mellitus ([place])      Social History   Socioeconomic History  . Marital status: Widowed    Spouse name: Not on file  . Number of children: Not on file  . Years of education: Not on file  . Highest education level: Not on file  Occupational History  . Occupation: Retired  Scientific laboratory technician  . Financial resource strain: Not on file  . Food insecurity:    Worry: Not on file    Inability: Not on file  . Transportation needs:    Medical: Not on file    Non-medical: Not on file  Tobacco Use  . Smoking status: Former Smoker    Packs/day: 0.50    Years: 20.00    Pack years: 10.00    Types: Cigarettes  . Smokeless tobacco: Never Used  . Tobacco comment: "quit smoking cigarettes in the 1960s   Substance and Sexual Activity  . Alcohol use: No  . Drug use: No  . Sexual activity: Not Currently  Lifestyle  . Physical activity:    Days per week: Not on file    Minutes per session: Not on file  . Stress: Not on file  Relationships  . Social connections:    Talks on phone: Not on file    Gets together: Not on file    Attends religious service: Not on file    Active member of club or organization: Not on file    Attends meetings of clubs or organizations: Not on file    Relationship status: Not on file  . Intimate partner violence:    Fear of current or ex partner: Not on file    Emotionally abused: Not on file    Physically abused: Not on file    Forced sexual activity: Not on file  Other Topics Concern  . Not on file  Social History Narrative   Pt lives alone, family nearby.    Past Surgical History:  Procedure Laterality Date  . CARDIAC CATHETERIZATION  2003;   . CARDIAC CATHETERIZATION N/A 11/07/2014   Left Heart Cath; Belva Crome, MD;   . CATARACT EXTRACTION, BILATERAL Bilateral   . CHOLECYSTECTOMY N/A 05/20/2015   Procedure:  LAPAROSCOPIC CHOLECYSTECTOMY;  Surgeon: Coralie Keens, MD;  Location: [place];  Service: General;  Laterality: N/A;  . COLONOSCOPY    . COLONOSCOPY N/A 05/12/2015   Procedure: COLONOSCOPY;  Surgeon: Milus Banister, MD;  Location: [place];  Service: Endoscopy;  Laterality: N/A;  . [place]   [name] 07/13/2010  . CORONARY ARTERY BYPASS GRAFT  2003   CABG X5  . ESOPHAGOGASTRODUODENOSCOPY  06/10/2011   Procedure: ESOPHAGOGASTRODUODENOSCOPY (EGD);  Surgeon: Ladene Artist, MD,FACG;  Location: [facility] ENDOSCOPY;  Service: Endoscopy;  Laterality: N/A;  . ESOPHAGOGASTRODUODENOSCOPY N/A 10/04/2016   Procedure: ESOPHAGOGASTRODUODENOSCOPY (EGD);  Surgeon: Manus Gunning, MD;  Location: [facility];  Service: Gastroenterology;  Laterality: N/A;    Family History  Problem Relation Age of Onset  . Stroke Mother   . Hypertension Mother   . Cancer Mother   . Diabetes Unknown        brothers and sisters  . Coronary artery disease Unknown   . Hypertension Sister   . Heart attack Sister   . Colon cancer Neg Hx     No Known Allergies  Current Outpatient Medications on File Prior to Visit  Medication Sig Dispense Refill  . ACCU-CHEK SOFTCLIX LANCETS lancets CHECK BLOOD GLUCOSE THREE TIMES DAILY 100 each 0  . acetaminophen (TYLENOL) 500 MG tablet Take 500 mg by mouth every 8 (eight) hours as needed for mild pain or headache.    . Alcohol Swabs (ALCOHOL PREP) PADS USE TO TEST BLOOD GLUCOSE THREE TIMES DAILY 300 each 3  . amLODipine (NORVASC) 10 MG tablet Take 10 mg by mouth daily.    Marland Kitchen aspirin EC 81 MG tablet Take 81 mg by mouth at bedtime.    Marland Kitchen atorvastatin (LIPITOR) 80 MG tablet Take 1 tablet (80 mg total) by mouth daily at 6 PM. 90 tablet 3  . BD PEN NEEDLE NANO U/F 32G X 4 MM MISC INJECT TWO TIMES DAILY AS NEEDED. 180 each 3  . bisacodyl (DULCOLAX) 5 MG EC tablet Take 5 mg by mouth daily as needed for moderate constipation.    . Blood Glucose Monitoring Suppl (ACCU-CHEK  AVIVA) device USE TO TEST BLOOD GLUCOSE THREE TIMES DAILY 1 each 0  . carvedilol (COREG) 25 MG tablet TAKE 1 TABLET TWICE DAILY WITH MEALS 180 tablet 3  . cloNIDine (CATAPRES) 0.1 MG tablet TAKE 1 TABLET AT BEDTIME 90 tablet 3  . Ferrous Sulfate (IRON) 325 (65 Fe) MG TABS Take 1 tablet by  mouth daily. 30 each 0  . furosemide (LASIX) 40 MG tablet TAKE 1 AND 1/2 TABLETS EVERY MORNING  AND TAKE 1 TABLET EVERY EVENING 225 tablet 3  . glucose blood (ACCU-CHEK AVIVA PLUS) test strip USE TO TEST BLOOD GLUCOSE THREE TIMES DAILY 300 each 3  . hydrALAZINE (APRESOLINE) 25 MG tablet Take 1 tablet (25 mg total) by mouth 3 (three) times daily. 270 tablet 2  . insulin aspart (NOVOLOG FLEXPEN) 100 UNIT/ML FlexPen Inject 14 Units into the skin 3 (three) times daily with meals.    . Insulin Glargine (LANTUS SOLOSTAR) 100 UNIT/ML Solostar Pen Inject 16 Units into the skin daily at 10 pm. 15 mL 3  . isosorbide mononitrate (IMDUR) 60 MG 24 hr tablet TAKE 2 TABLETS EVERY DAY 180 tablet 3  . Lancet Devices (ACCU-CHEK SOFTCLIX) lancets USE TO TEST BLOOD GLUCOSE THREE TIMES DAILY 1 each 0  . latanoprost (XALATAN) 0.005 % ophthalmic solution Place 1 drop into both eyes at bedtime.    . nitroGLYCERIN (NITROSTAT) 0.4 MG SL tablet Place 1 tablet (0.4 mg total) under the tongue every 5 (five) minutes as needed for chest pain (3 doses MAX). 25 tablet 3  . omeprazole (PRILOSEC) 20 MG capsule TAKE 1 CAPSULE BY MOUTH TWICE DAILY BEFORE MEAL(S) 180 capsule 1  . pentoxifylline (TRENTAL) 400 MG CR tablet Take 400 mg by mouth 2 (two) times daily. (Take at 10 am and 5 pm)    . polyethylene glycol (MIRALAX / GLYCOLAX) packet Take 17 g by mouth daily as needed for mild constipation or moderate constipation. 30 each 1  . predniSONE (DELTASONE) 10 MG tablet Take 1 tablet (10 mg total) by mouth daily with breakfast. 10 tablet 0  . timolol (TIMOPTIC) 0.5 % ophthalmic solution Place 1 drop into both eyes every morning.    . TRAVATAN Z 0.004 %  SOLN ophthalmic solution Place 1 drop into both eyes every evening.     No current facility-administered medications on file prior to visit.     BP 140/70 (BP Location: Right Arm, Patient Position: Sitting, Cuff Size: Normal)   Pulse (!) 55   Temp 98 F (36.7 C) (Oral)   Wt 164 lb 9.6 oz (74.7 kg)   SpO2 98%   BMI 23.62 kg/m     Review of Systems  Constitutional: Negative for appetite change, chills, fatigue and fever.  HENT: Negative for congestion, dental problem, ear pain, hearing loss, sore throat, tinnitus, trouble swallowing and voice change.   Eyes: Negative for pain, discharge and visual disturbance.  Respiratory: Negative for cough, chest tightness, wheezing and stridor.   Cardiovascular: Negative for chest pain, palpitations and leg swelling.  Gastrointestinal: Negative for abdominal distention, abdominal pain, blood in stool, constipation, diarrhea, nausea and vomiting.  Genitourinary: Negative for difficulty urinating, discharge, flank pain, genital sores, hematuria and urgency.  Musculoskeletal: Negative for arthralgias, back pain, gait problem, joint swelling, myalgias and neck stiffness.  Skin: Negative for rash.  Neurological: Negative for dizziness, syncope, speech difficulty, weakness, numbness and headaches.  Hematological: Negative for adenopathy. Does not bruise/bleed easily.  Psychiatric/Behavioral: Negative for behavioral problems and dysphoric mood. The patient is not nervous/anxious.        Objective:   Physical Exam  Constitutional: He is oriented to person, place, and time. He appears well-developed.  Blood pressure well controlled Weight 164.6  HENT:  Head: Normocephalic.  Right Ear: External ear normal.  Left Ear: External ear normal.  Eyes: Conjunctivae and EOM are normal.  Neck:  Normal range of motion.  Cardiovascular: Normal rate and normal heart sounds.  Status post sternotomy Rhythm is slow and regular  Pulmonary/Chest: Breath sounds  normal.  Abdominal: Bowel sounds are normal.  Musculoskeletal: Normal range of motion. He exhibits no tenderness.  Neurological: He is alert and oriented to person, place, and time.  Psychiatric: He has a normal mood and affect. His behavior is normal.          Assessment & Plan:   Diabetes mellitus.  Stable; will decrease basal insulin slightly to 12 units Coronary artery disease Chronic systolic heart failure stable B12 deficiency  Chronic kidney disease  No change in medical regimen Medications updated Follow-up 3 to 4 months  Marletta Lor

## 2017-11-02 NOTE — Patient Instructions (Addendum)
Limit your sodium (Salt) intake   Please check your hemoglobin A1c every 3-4 months     It is important that you exercise regularly, at least 20 minutes 3 to 4 times per week.  If you develop chest pain or shortness of breath seek  medical attention.   Decrease Lantus insulin to 12 units at bedtime

## 2017-11-07 ENCOUNTER — Other Ambulatory Visit: Payer: Self-pay | Admitting: Interventional Cardiology

## 2017-11-09 ENCOUNTER — Ambulatory Visit: Payer: Medicare HMO

## 2017-12-01 ENCOUNTER — Ambulatory Visit (INDEPENDENT_AMBULATORY_CARE_PROVIDER_SITE_OTHER): Payer: Medicare HMO

## 2017-12-01 DIAGNOSIS — E538 Deficiency of other specified B group vitamins: Secondary | ICD-10-CM | POA: Diagnosis not present

## 2017-12-01 DIAGNOSIS — Z23 Encounter for immunization: Secondary | ICD-10-CM

## 2017-12-01 MED ORDER — CYANOCOBALAMIN 1000 MCG/ML IJ SOLN
1000.0000 ug | Freq: Once | INTRAMUSCULAR | Status: AC
  Administered 2017-12-01: 1000 ug via INTRAMUSCULAR

## 2017-12-01 NOTE — Progress Notes (Signed)
Per orders of Dr. Sherren Mocha, injection of B12 1062mcg given by Rebecca Eaton. Patient tolerated injection well.

## 2017-12-13 ENCOUNTER — Other Ambulatory Visit: Payer: Self-pay | Admitting: Gastroenterology

## 2017-12-27 DIAGNOSIS — D631 Anemia in chronic kidney disease: Secondary | ICD-10-CM | POA: Diagnosis not present

## 2017-12-27 DIAGNOSIS — N2581 Secondary hyperparathyroidism of renal origin: Secondary | ICD-10-CM | POA: Diagnosis not present

## 2017-12-27 DIAGNOSIS — I129 Hypertensive chronic kidney disease with stage 1 through stage 4 chronic kidney disease, or unspecified chronic kidney disease: Secondary | ICD-10-CM | POA: Diagnosis not present

## 2017-12-27 DIAGNOSIS — N184 Chronic kidney disease, stage 4 (severe): Secondary | ICD-10-CM | POA: Diagnosis not present

## 2017-12-27 DIAGNOSIS — E611 Iron deficiency: Secondary | ICD-10-CM | POA: Diagnosis not present

## 2018-01-01 ENCOUNTER — Ambulatory Visit (INDEPENDENT_AMBULATORY_CARE_PROVIDER_SITE_OTHER): Payer: Medicare HMO | Admitting: *Deleted

## 2018-01-01 ENCOUNTER — Other Ambulatory Visit: Payer: Self-pay | Admitting: Internal Medicine

## 2018-01-01 DIAGNOSIS — E538 Deficiency of other specified B group vitamins: Secondary | ICD-10-CM | POA: Diagnosis not present

## 2018-01-01 MED ORDER — CYANOCOBALAMIN 1000 MCG/ML IJ SOLN
1000.0000 ug | Freq: Once | INTRAMUSCULAR | Status: AC
  Administered 2018-01-01: 1000 ug via INTRAMUSCULAR

## 2018-01-31 ENCOUNTER — Ambulatory Visit: Payer: Medicare HMO

## 2018-02-01 ENCOUNTER — Ambulatory Visit: Payer: Medicare HMO

## 2018-02-01 ENCOUNTER — Encounter: Payer: Self-pay | Admitting: Internal Medicine

## 2018-02-01 ENCOUNTER — Ambulatory Visit (INDEPENDENT_AMBULATORY_CARE_PROVIDER_SITE_OTHER): Payer: Medicare HMO | Admitting: Internal Medicine

## 2018-02-01 VITALS — BP 120/64 | HR 60 | Temp 97.8°F | Wt 165.0 lb

## 2018-02-01 DIAGNOSIS — Z794 Long term (current) use of insulin: Secondary | ICD-10-CM | POA: Diagnosis not present

## 2018-02-01 DIAGNOSIS — N184 Chronic kidney disease, stage 4 (severe): Secondary | ICD-10-CM | POA: Diagnosis not present

## 2018-02-01 DIAGNOSIS — E538 Deficiency of other specified B group vitamins: Secondary | ICD-10-CM | POA: Diagnosis not present

## 2018-02-01 DIAGNOSIS — I25709 Atherosclerosis of coronary artery bypass graft(s), unspecified, with unspecified angina pectoris: Secondary | ICD-10-CM

## 2018-02-01 DIAGNOSIS — I1310 Hypertensive heart and chronic kidney disease without heart failure, with stage 1 through stage 4 chronic kidney disease, or unspecified chronic kidney disease: Secondary | ICD-10-CM | POA: Diagnosis not present

## 2018-02-01 DIAGNOSIS — E785 Hyperlipidemia, unspecified: Secondary | ICD-10-CM

## 2018-02-01 DIAGNOSIS — I5042 Chronic combined systolic (congestive) and diastolic (congestive) heart failure: Secondary | ICD-10-CM | POA: Diagnosis not present

## 2018-02-01 DIAGNOSIS — E1122 Type 2 diabetes mellitus with diabetic chronic kidney disease: Secondary | ICD-10-CM

## 2018-02-01 DIAGNOSIS — I10 Essential (primary) hypertension: Secondary | ICD-10-CM

## 2018-02-01 LAB — POCT GLYCOSYLATED HEMOGLOBIN (HGB A1C): HEMOGLOBIN A1C: 6.5 % — AB (ref 4.0–5.6)

## 2018-02-01 MED ORDER — CYANOCOBALAMIN 1000 MCG/ML IJ SOLN
1000.0000 ug | Freq: Once | INTRAMUSCULAR | Status: AC
  Administered 2018-02-01: 1000 ug via INTRAMUSCULAR

## 2018-02-01 NOTE — Assessment & Plan Note (Signed)
Echo 3/18: EF 30-35% and grade 2 DD

## 2018-02-01 NOTE — Patient Instructions (Signed)
-  It was nice meeting you today!  -Please schedule follow-up with me in 3 to 4 months for continued diabetic management.

## 2018-02-01 NOTE — Progress Notes (Addendum)
Established Patient Office Visit     CC/Reason for Visit: Establish care and follow-up of chronic medical conditions  HPI: [name] is a 84 y.o. male who is coming in today for the above mentioned reasons.  Due for annual physical exam in June 2020.  Past Medical History is significant for: Chronic combined heart failure with a known ejection fraction of 30 to 35% and grade 2 diastolic dysfunction, hypertension, coronary artery disease, diabetes which is insulin-dependent,, chronic kidney disease stage IV, B12 deficiency.  His coronary artery disease and chronic combined heart failure is well controlled, follows with cardiology routinely on a biannual to annual basis, his stage IV kidney disease is also followed by nephrology and has been stable (patient states he would never want to go on dialysis), his diabetes is well controlled on insulin, he takes Lantus 12 units at bedtime as well as meal coverage NovoLog 14 units 3 times a day.  He has no acute complaints at today's visit.   Past Medical/Surgical History: Past Medical History:  Diagnosis Date  . Arthritis    "touch in my fingers" (09/10/2014)  . Atrophic gastritis   . AVM (arteriovenous malformation) of duodenum, acquired   . B12 deficiency anemia    "stopped taking the shots in ~ 06/2014" (09/10/2014)  . Barrett's esophagus   . CAD (coronary artery disease)    a. H/o MI in 1998;  b. s/p CABG 2003. c. 10/2014 NSTEMI/Cath: LM 75, LAD 185m/d, D1 95, D2 50, LCX 100ost/p, OM1 80, OM2 80, RCA 100p/m, RPDA fills via L->L collats, LIMA->LAD ok, VG->dRCA 100, VG->OM1->OM2 99 prox to OM1 insertion->recanalated ->Tx with heparin,  VG->D1 100-->Med Rx.  . Cataract   . Chronic systolic CHF (congestive heart failure) (HCC)    a. EF 40-45% in 10/2014 (previously normal in 08/2014).  . CKD (chronic kidney disease), stage IV ([place])   . Diabetes 1.5, managed as type 1 ([place])   . History of stomach ulcers 2013  . Hyperlipidemia   .  Hypertension   . Hypertensive heart disease   . Ischemic cardiomyopathy    a. 10/2014 Echo: EF 40-45%.  . Myocardial infarction ([place])   . PVD (peripheral vascular disease) ([place])   . Type II diabetes mellitus ([place])     Past Surgical History:  Procedure Laterality Date  . CARDIAC CATHETERIZATION  2003;   . CARDIAC CATHETERIZATION N/A 11/07/2014   Left Heart Cath; Belva Crome, MD;   . CATARACT EXTRACTION, BILATERAL Bilateral   . CHOLECYSTECTOMY N/A 05/20/2015   Procedure: LAPAROSCOPIC CHOLECYSTECTOMY;  Surgeon: Coralie Keens, MD;  Location: [place];  Service: General;  Laterality: N/A;  . COLONOSCOPY    . COLONOSCOPY N/A 05/12/2015   Procedure: COLONOSCOPY;  Surgeon: Milus Banister, MD;  Location: [place];  Service: Endoscopy;  Laterality: N/A;  . [place]   [name] 07/13/2010  . CORONARY ARTERY BYPASS GRAFT  2003   CABG X5  . ESOPHAGOGASTRODUODENOSCOPY  06/10/2011   Procedure: ESOPHAGOGASTRODUODENOSCOPY (EGD);  Surgeon: Ladene Artist, MD,FACG;  Location: [facility] ENDOSCOPY;  Service: Endoscopy;  Laterality: N/A;  . ESOPHAGOGASTRODUODENOSCOPY N/A 10/04/2016   Procedure: ESOPHAGOGASTRODUODENOSCOPY (EGD);  Surgeon: Manus Gunning, MD;  Location: [facility];  Service: Gastroenterology;  Laterality: N/A;    Social History:  reports that he has quit smoking. His smoking use included cigarettes. He has a 10.00 pack-year smoking history. He has never used smokeless tobacco. He reports that he does not drink alcohol or use  drugs.  Allergies: No Known Allergies  Family History:  Family History  Problem Relation Age of Onset  . Stroke Mother   . Hypertension Mother   . Cancer Mother   . Diabetes Unknown        brothers and sisters  . Coronary artery disease Unknown   . Hypertension Sister   . Heart attack Sister   . Colon cancer Neg Hx      Current Outpatient Medications:  .  ACCU-CHEK SOFTCLIX LANCETS lancets, CHECK BLOOD GLUCOSE THREE TIMES DAILY,  Disp: 100 each, Rfl: 0 .  acetaminophen (TYLENOL) 500 MG tablet, Take 500 mg by mouth every 8 (eight) hours as needed for mild pain or headache., Disp: , Rfl:  .  Alcohol Swabs (ALCOHOL PREP) PADS, USE TO TEST BLOOD GLUCOSE THREE TIMES DAILY, Disp: 300 each, Rfl: 3 .  amLODipine (NORVASC) 10 MG tablet, TAKE 1 TABLET EVERY DAY, Disp: 90 tablet, Rfl: 0 .  aspirin EC 81 MG tablet, Take 81 mg by mouth at bedtime., Disp: , Rfl:  .  atorvastatin (LIPITOR) 80 MG tablet, Take 1 tablet (80 mg total) by mouth daily at 6 PM., Disp: 90 tablet, Rfl: 3 .  BD PEN NEEDLE NANO U/F 32G X 4 MM MISC, INJECT TWO TIMES DAILY AS NEEDED., Disp: 180 each, Rfl: 3 .  bisacodyl (DULCOLAX) 5 MG EC tablet, Take 5 mg by mouth daily as needed for moderate constipation., Disp: , Rfl:  .  Blood Glucose Monitoring Suppl (ACCU-CHEK AVIVA) device, USE TO TEST BLOOD GLUCOSE THREE TIMES DAILY, Disp: 1 each, Rfl: 0 .  carvedilol (COREG) 25 MG tablet, TAKE 1 TABLET TWICE DAILY WITH MEALS, Disp: 180 tablet, Rfl: 3 .  cloNIDine (CATAPRES) 0.1 MG tablet, TAKE 1 TABLET AT BEDTIME, Disp: 90 tablet, Rfl: 3 .  Ferrous Sulfate (IRON) 325 (65 Fe) MG TABS, Take 1 tablet by mouth daily., Disp: 30 each, Rfl: 0 .  furosemide (LASIX) 40 MG tablet, TAKE 1 AND 1/2 TABLETS EVERY MORNING  AND TAKE 1 TABLET EVERY EVENING, Disp: 225 tablet, Rfl: 3 .  glucose blood (ACCU-CHEK AVIVA PLUS) test strip, USE TO TEST BLOOD GLUCOSE THREE TIMES DAILY, Disp: 300 each, Rfl: 3 .  hydrALAZINE (APRESOLINE) 25 MG tablet, TAKE 1 TABLET (25 MG TOTAL) 3 TIMES DAILY., Disp: 270 tablet, Rfl: 3 .  insulin aspart (NOVOLOG FLEXPEN) 100 UNIT/ML FlexPen, Inject 14 Units into the skin 3 (three) times daily with meals., Disp: , Rfl:  .  Insulin Glargine (LANTUS SOLOSTAR) 100 UNIT/ML Solostar Pen, Inject 12 Units into the skin daily at 10 pm., Disp: 15 mL, Rfl: 3 .  isosorbide mononitrate (IMDUR) 60 MG 24 hr tablet, TAKE 2 TABLETS EVERY DAY, Disp: 180 tablet, Rfl: 3 .  Lancet Devices  (ACCU-CHEK SOFTCLIX) lancets, USE TO TEST BLOOD GLUCOSE THREE TIMES DAILY, Disp: 1 each, Rfl: 0 .  latanoprost (XALATAN) 0.005 % ophthalmic solution, Place 1 drop into both eyes at bedtime., Disp: , Rfl:  .  nitroGLYCERIN (NITROSTAT) 0.4 MG SL tablet, Place 1 tablet (0.4 mg total) under the tongue every 5 (five) minutes as needed for chest pain (3 doses MAX)., Disp: 25 tablet, Rfl: 3 .  omeprazole (PRILOSEC) 20 MG capsule, TAKE 1 CAPSULE BY MOUTH TWICE DAILY BEFORE MEAL(S), Disp: 180 capsule, Rfl: 0 .  pentoxifylline (TRENTAL) 400 MG CR tablet, Take 400 mg by mouth 2 (two) times daily. (Take at 10 am and 5 pm), Disp: , Rfl:  .  polyethylene glycol (MIRALAX / GLYCOLAX) packet,  Take 17 g by mouth daily as needed for mild constipation or moderate constipation., Disp: 30 each, Rfl: 1 .  predniSONE (DELTASONE) 10 MG tablet, Take 1 tablet (10 mg total) by mouth daily with breakfast., Disp: 10 tablet, Rfl: 0 .  timolol (TIMOPTIC) 0.5 % ophthalmic solution, Place 1 drop into both eyes every morning., Disp: , Rfl:  .  TRAVATAN Z 0.004 % SOLN ophthalmic solution, Place 1 drop into both eyes every evening., Disp: , Rfl:   Review of Systems:  Constitutional: Denies fever, chills, diaphoresis, appetite change and fatigue.  HEENT: Denies photophobia, eye pain, redness, hearing loss, ear pain, congestion, sore throat, rhinorrhea, sneezing, mouth sores, trouble swallowing, neck pain, neck stiffness and tinnitus.   Respiratory: Denies SOB, DOE, cough, chest tightness,  and wheezing.   Cardiovascular: Denies chest pain, palpitations and leg swelling.  Gastrointestinal: Denies nausea, vomiting, abdominal pain, diarrhea, constipation, blood in stool and abdominal distention.  Genitourinary: Denies dysuria, urgency, frequency, hematuria, flank pain and difficulty urinating.  Endocrine: Denies: hot or cold intolerance, sweats, changes in hair or nails, polyuria, polydipsia. Musculoskeletal: Denies myalgias, back pain,  joint swelling, arthralgias and gait problem.  Skin: Denies pallor, rash and wound.  Neurological: Denies dizziness, seizures, syncope, weakness, light-headedness, numbness and headaches.  Hematological: Denies adenopathy. Easy bruising, personal or family bleeding history  Psychiatric/Behavioral: Denies suicidal ideation, mood changes, confusion, nervousness, sleep disturbance and agitation    Physical Exam: Vitals:   02/01/18 0911  BP: 120/64  Pulse: 60  Temp: 97.8 F (36.6 C)  TempSrc: Oral  SpO2: 97%  Weight: 165 lb (74.8 kg)    Body mass index is 23.68 kg/m.   Constitutional: NAD, calm, comfortable Eyes: PERRL, lids and conjunctivae normal ENMT: Mucous membranes are moist. Posterior pharynx clear of any exudate or lesions. Normal dentition. Tympanic membrane is pearly white, no erythema or bulging. Neck: normal, supple, no masses, no thyromegaly Respiratory: clear to auscultation bilaterally, no wheezing, no crackles. Normal respiratory effort. No accessory muscle use.  Cardiovascular: Regular rate and rhythm, no murmurs / rubs / gallops. No extremity edema. 2+ pedal pulses. No carotid bruits.  Abdomen: no tenderness, no masses palpated. No hepatosplenomegaly. Bowel sounds positive.  Musculoskeletal: no clubbing / cyanosis. No joint deformity upper and lower extremities. Good ROM, no contractures. Normal muscle tone.  Skin: no rashes, lesions, ulcers. No induration Neurologic: CN 2-12 grossly intact. Sensation intact, DTR normal. Strength 5/5 in all 4.  Psychiatric: Normal judgment and insight. Alert and oriented x 3. Normal mood.    Impression and Plan:  Vitamin B 12 deficiency  -Gets B12 injections monthly here in the office.  Type 2 BM with stage IV CKD (HCC) - Plan: POCT glycosylated hemoglobin (Hb A1C)  Lab Results  Component Value Date   HGBA1C 6.5 (A) 02/01/2018   -Well-controlled, A1c today 6.5, continue current regimen of Lantus 10 units and NovoLog 14  units 3 times daily for meal coverage. -He is up-to-date on his eye exams, sees Dr. Carolyn Stare and is due for repeat exam in January of next year.  Chronic combined systolic and diastolic heart failure ([place]) -Echo from March 2018 with ejection fraction of 30 to 35% and grade 2 diastolic dysfunction. -He is on Coreg, atorvastatin, aspirin, Lasix, not on ACE inhibitor/ARB/ARN I due to advanced renal insufficiency. -He is compensated.  Dyslipidemia -On atorvastatin 80 mg daily. -Lipids are extremely well controlled with an LDL of 45 on July 2019.  Essential hypertension -Well-controlled, continue current management.  Coronary artery  disease involving 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 ([place]) -Stable, followed by cardiology.  CKD (chronic kidney disease), stage IV ([place]) -Advanced, follows with nephrology. -Baseline creatinine is between 3.3 and 3.5.    Patient Instructions  -It was nice meeting you today!  -Please schedule follow-up with me in 3 to 4 months for continued diabetic management.     Lelon Frohlich, MD [place] [facility]

## 2018-02-12 ENCOUNTER — Encounter: Payer: Self-pay | Admitting: Gastroenterology

## 2018-02-12 ENCOUNTER — Telehealth: Payer: Self-pay | Admitting: Gastroenterology

## 2018-02-12 MED ORDER — OMEPRAZOLE 20 MG PO CPDR: 20.0000 mg | DELAYED_RELEASE_CAPSULE | Freq: Two times a day (BID) | ORAL | 0 refills | Status: DC

## 2018-02-12 NOTE — Telephone Encounter (Signed)
Called and spoke to pt. Made an OV with Dr. Havery Moros for January 2020. Send 90 day supply of Omep 20mg , bid to [facility].

## 2018-02-12 NOTE — Telephone Encounter (Signed)
Pt called in wanting to know if his prescription for omeprazole Can be called in to his [facility] mail order.

## 2018-03-05 ENCOUNTER — Ambulatory Visit (INDEPENDENT_AMBULATORY_CARE_PROVIDER_SITE_OTHER): Payer: Medicare HMO | Admitting: *Deleted

## 2018-03-05 DIAGNOSIS — E538 Deficiency of other specified B group vitamins: Secondary | ICD-10-CM | POA: Diagnosis not present

## 2018-03-05 MED ORDER — CYANOCOBALAMIN 1000 MCG/ML IJ SOLN
1000.0000 ug | Freq: Once | INTRAMUSCULAR | Status: AC
  Administered 2018-03-05: 1000 ug via INTRAMUSCULAR

## 2018-03-08 NOTE — [place] Notes (Signed)
Agree with B12 injection as given.  I was in office at time of injection and available for any questions or concerns.  Eulas Post MD [facility]

## 2018-03-15 ENCOUNTER — Other Ambulatory Visit (INDEPENDENT_AMBULATORY_CARE_PROVIDER_SITE_OTHER): Payer: Medicare HMO

## 2018-03-15 ENCOUNTER — Encounter: Payer: Self-pay | Admitting: Gastroenterology

## 2018-03-15 ENCOUNTER — Ambulatory Visit: Payer: Medicare HMO | Admitting: Gastroenterology

## 2018-03-15 VITALS — BP 116/50 | HR 68 | Ht 68.5 in | Wt 167.0 lb

## 2018-03-15 DIAGNOSIS — D649 Anemia, unspecified: Secondary | ICD-10-CM | POA: Diagnosis not present

## 2018-03-15 DIAGNOSIS — K3189 Other diseases of stomach and duodenum: Secondary | ICD-10-CM

## 2018-03-15 DIAGNOSIS — K31819 Angiodysplasia of stomach and duodenum without bleeding: Secondary | ICD-10-CM | POA: Diagnosis not present

## 2018-03-15 DIAGNOSIS — K2270 Barrett's esophagus without dysplasia: Secondary | ICD-10-CM

## 2018-03-15 DIAGNOSIS — K31A0 Gastric intestinal metaplasia, unspecified: Secondary | ICD-10-CM

## 2018-03-15 DIAGNOSIS — K2940 Chronic atrophic gastritis without bleeding: Secondary | ICD-10-CM | POA: Diagnosis not present

## 2018-03-15 LAB — CBC WITH DIFFERENTIAL/PLATELET
Basophils Absolute: 0 10*3/uL (ref 0.0–0.1)
Basophils Relative: 0.8 % (ref 0.0–3.0)
Eosinophils Absolute: 0.4 10*3/uL (ref 0.0–0.7)
Eosinophils Relative: 6.3 % — ABNORMAL HIGH (ref 0.0–5.0)
HCT: 35.2 % — ABNORMAL LOW (ref 39.0–52.0)
Hemoglobin: 11.9 g/dL — ABNORMAL LOW (ref 13.0–17.0)
LYMPHS PCT: 12.1 % (ref 12.0–46.0)
Lymphs Abs: 0.7 10*3/uL (ref 0.7–4.0)
MCHC: 33.8 g/dL (ref 30.0–36.0)
MCV: 93.8 fl (ref 78.0–100.0)
Monocytes Absolute: 0.4 10*3/uL (ref 0.1–1.0)
Monocytes Relative: 7.4 % (ref 3.0–12.0)
Neutro Abs: 4.1 10*3/uL (ref 1.4–7.7)
Neutrophils Relative %: 73.4 % (ref 43.0–77.0)
Platelets: 184 10*3/uL (ref 150.0–400.0)
RBC: 3.76 Mil/uL — ABNORMAL LOW (ref 4.22–5.81)
RDW: 15.6 % — ABNORMAL HIGH (ref 11.5–15.5)
WBC: 5.6 10*3/uL (ref 4.0–10.5)

## 2018-03-15 LAB — FERRITIN: Ferritin: 40.3 ng/mL (ref 22.0–322.0)

## 2018-03-15 LAB — VITAMIN B12: Vitamin B-12: 1071 pg/mL — ABNORMAL HIGH (ref 211–911)

## 2018-03-15 NOTE — Patient Instructions (Signed)
If you are age 83 or older, your body mass index should be between 23-30. Your Body mass index is 25.02 kg/m. If this is out of the aforementioned range listed, please consider follow up with your Primary Care Provider.  If you are age 54 or younger, your body mass index should be between 19-25. Your Body mass index is 25.02 kg/m. If this is out of the aformentioned range listed, please consider follow up with your Primary Care Provider.   Please go to the lab in the basement of our building to have lab work done as you leave today. Hit "B" for basement when you get on the elevator.  When the doors open the lab is on your left.  We will call you with the results. Thank you.  Thank you for entrusting me with your care and for choosing [facility], Dr. Marion Cellar

## 2018-03-15 NOTE — Progress Notes (Signed)
HPI :  85 year old male here for a follow-up visit. He's been followed here for multifactorial anemia. 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  Last EGD in 10/04/16 - multiple duodenal AVMs, atrophic stomach, small hiatal hernia, irregular z-line - biopsies showed marked atrophic gastritis with intestinal metaplasia. Short segment BE without dysplasia. He's been taking omeprazole 20 mg once daily. That has controlled his heartburn well. He denies any reflux symptoms. No dysphagia. No postprandial abdominal pains. He is eating well. No weight loss. No blood in his stools. No trouble with his bowels. Overall doing quite well. Last Hgb about 7 months ago, Hgb 11.8.   No FH of gastric cancer. No FH of esophagus cancer.   Endoscopic history: EGD 06/10/2011 - antral ulcer 34mm in size. Biopsies negative for H pylori. Colonoscopy - 05/12/2015 - during GI bleed, extensive diverticulosis / hemorrhoids, thought to have left sided colonic bleed Colonoscopy 04/2008 - 85mm polyp, segmental colitis of left colon - unclear if related to diverticulosis, biopsies nonspecific EGD 10/04/16 - multiple duodenal AVMs, atrophic stomach, small hiatal hernia, irregular z-line - biopsies showed marked atrophic gastritis with intestinal metaplasia. Short segment BE without dysplasia. Biopsies negative for BE.   Past Medical History:  Diagnosis Date  . Arthritis    "touch in my fingers" (09/10/2014)  . Atrophic gastritis   . AVM (arteriovenous malformation) of duodenum, acquired   . B12 deficiency anemia    "stopped taking the shots in ~ 06/2014" (09/10/2014)  . Barrett's esophagus   . CAD (coronary artery disease)    a. H/o MI in 1998;  b. s/p CABG 2003. c. 10/2014 NSTEMI/Cath: LM 75, LAD 151m/d, D1 95, D2 50, LCX 100ost/p, OM1 80, OM2 80, RCA 100p/m, RPDA fills via L->L collats, LIMA->LAD ok, VG->dRCA 100, VG->OM1->OM2 99 prox to OM1  insertion->recanalated ->Tx with heparin,  VG->D1 100-->Med Rx.  . Cataract   . Chronic systolic CHF (congestive heart failure) (HCC)    a. EF 40-45% in 10/2014 (previously normal in 08/2014).  . CKD (chronic kidney disease), stage IV ([place])   . Diabetes 1.5, managed as type 1 ([place])   . Hiatal hernia 2018   endoscopy -Dr. Havery Moros  . History of stomach ulcers 2013  . Hyperlipidemia   . Hypertension   . Hypertensive heart disease   . Ischemic cardiomyopathy    a. 10/2014 Echo: EF 40-45%.  . Myocardial infarction ([place])   . PVD (peripheral vascular disease) ([place])   . Type II diabetes mellitus ([place])      Past Surgical History:  Procedure Laterality Date  . CARDIAC CATHETERIZATION  2003;   . CARDIAC CATHETERIZATION N/A 11/07/2014   Left Heart Cath; Belva Crome, MD;   . CATARACT EXTRACTION, BILATERAL Bilateral   . CHOLECYSTECTOMY N/A 05/20/2015   Procedure: LAPAROSCOPIC CHOLECYSTECTOMY;  Surgeon: Coralie Keens, MD;  Location: [facility];  Service: General;  Laterality: N/A;  . COLONOSCOPY    . COLONOSCOPY N/A 05/12/2015   Procedure: COLONOSCOPY;  Surgeon: Milus Banister, MD;  Location: [facility];  Service: Endoscopy;  Laterality: N/A;  . [place]   [name] 07/13/2010  . CORONARY ARTERY BYPASS GRAFT  2003   CABG X5  . ESOPHAGOGASTRODUODENOSCOPY  06/10/2011   Procedure: ESOPHAGOGASTRODUODENOSCOPY (EGD);  Surgeon: Ladene Artist, MD,FACG;  Location: [facility] ENDOSCOPY;  Service: Endoscopy;  Laterality: N/A;  . ESOPHAGOGASTRODUODENOSCOPY N/A 10/04/2016   Procedure: ESOPHAGOGASTRODUODENOSCOPY (EGD);  Surgeon:  Roshan Roback, Renelda Loma, MD;  Location: [facility];  Service: Gastroenterology;  Laterality: N/A;   Family History  Problem Relation Age of Onset  . Stroke Mother   . Hypertension Mother   . Cancer Mother   . Diabetes Other        brothers and sisters  . Coronary artery disease Other   . Hypertension Sister   . Heart attack Sister   . Colon cancer Neg Hx     Social History   Tobacco Use  . Smoking status: Former Smoker    Packs/day: 0.50    Years: 20.00    Pack years: 10.00    Types: Cigarettes  . Smokeless tobacco: Never Used  . Tobacco comment: "quit smoking cigarettes in the 1960s   Substance Use Topics  . Alcohol use: No  . Drug use: No   Current Outpatient Medications  Medication Sig Dispense Refill  . ACCU-CHEK SOFTCLIX LANCETS lancets CHECK BLOOD GLUCOSE THREE TIMES DAILY 100 each 0  . acetaminophen (TYLENOL) 500 MG tablet Take 500 mg by mouth every 8 (eight) hours as needed for mild pain or headache.    . Alcohol Swabs (ALCOHOL PREP) PADS USE TO TEST BLOOD GLUCOSE THREE TIMES DAILY 300 each 3  . amLODipine (NORVASC) 10 MG tablet TAKE 1 TABLET EVERY DAY 90 tablet 0  . aspirin EC 81 MG tablet Take 81 mg by mouth at bedtime.    Marland Kitchen atorvastatin (LIPITOR) 80 MG tablet Take 1 tablet (80 mg total) by mouth daily at 6 PM. 90 tablet 3  . BD PEN NEEDLE NANO U/F 32G X 4 MM MISC INJECT TWO TIMES DAILY AS NEEDED. 180 each 3  . bisacodyl (DULCOLAX) 5 MG EC tablet Take 5 mg by mouth daily as needed for moderate constipation.    . carvedilol (COREG) 25 MG tablet TAKE 1 TABLET TWICE DAILY WITH MEALS 180 tablet 3  . cloNIDine (CATAPRES) 0.1 MG tablet TAKE 1 TABLET AT BEDTIME 90 tablet 3  . Ferrous Sulfate (IRON) 325 (65 Fe) MG TABS Take 1 tablet by mouth daily. 30 each 0  . furosemide (LASIX) 40 MG tablet TAKE 1 AND 1/2 TABLETS EVERY MORNING  AND TAKE 1 TABLET EVERY EVENING 225 tablet 3  . glucose blood (ACCU-CHEK AVIVA PLUS) test strip USE TO TEST BLOOD GLUCOSE THREE TIMES DAILY 300 each 3  . hydrALAZINE (APRESOLINE) 25 MG tablet TAKE 1 TABLET (25 MG TOTAL) 3 TIMES DAILY. 270 tablet 3  . insulin aspart (NOVOLOG FLEXPEN) 100 UNIT/ML FlexPen Inject 14 Units into the skin 3 (three) times daily with meals.    . Insulin Glargine (LANTUS SOLOSTAR) 100 UNIT/ML Solostar Pen Inject 12 Units into the skin daily at 10 pm. 15 mL 3  . isosorbide mononitrate  (IMDUR) 60 MG 24 hr tablet TAKE 2 TABLETS EVERY DAY 180 tablet 3  . Lancet Devices (ACCU-CHEK SOFTCLIX) lancets USE TO TEST BLOOD GLUCOSE THREE TIMES DAILY 1 each 0  . latanoprost (XALATAN) 0.005 % ophthalmic solution Place 1 drop into both eyes at bedtime.    . nitroGLYCERIN (NITROSTAT) 0.4 MG SL tablet Place 1 tablet (0.4 mg total) under the tongue every 5 (five) minutes as needed for chest pain (3 doses MAX). 25 tablet 3  . omeprazole (PRILOSEC) 20 MG capsule Take 1 capsule (20 mg total) by mouth 2 (two) times daily before a meal. 180 capsule 0  . pentoxifylline (TRENTAL) 400 MG CR tablet Take 400 mg by mouth 2 (two) times daily. (Take at 10  am and 5 pm)    . polyethylene glycol (MIRALAX / GLYCOLAX) packet Take 17 g by mouth daily as needed for mild constipation or moderate constipation. 30 each 1  . predniSONE (DELTASONE) 10 MG tablet Take 1 tablet (10 mg total) by mouth daily with breakfast. 10 tablet 0  . timolol (TIMOPTIC) 0.5 % ophthalmic solution Place 1 drop into both eyes every morning.    . TRAVATAN Z 0.004 % SOLN ophthalmic solution Place 1 drop into both eyes every evening.     No current facility-administered medications for this visit.    No Known Allergies   Review of Systems: All systems reviewed and negative except where noted in HPI.     Physical Exam: BP (!) 116/50 (BP Location: Left Arm, Patient Position: Sitting, Cuff Size: Normal)   Pulse 68   Ht 5' 8.5" (1.74 m) Comment: height measured without shoes  Wt 167 lb (75.8 kg)   BMI 25.02 kg/m  Constitutional: Pleasant,well-developed, male in no acute distress. HEENT: Normocephalic and atraumatic. Conjunctivae are normal. No scleral icterus. Neck supple.  Cardiovascular: Normal rate, regular rhythm.  Pulmonary/chest: Effort normal and breath sounds normal. No wheezing, rales or rhonchi. Abdominal: Soft, nondistended, nontender.  There are no masses palpable. No hepatomegaly. Extremities: no edema Lymphadenopathy:  No cervical adenopathy noted. Neurological: Alert and oriented to person place and time. Skin: Skin is warm and dry. No rashes noted. Psychiatric: Normal mood and affect. Behavior is normal.   ASSESSMENT AND PLAN: 85 year old male here for reassessment of the following issues:  Anemia - multifactorial due to CKD, duodenal AVMs  (cauterized), atrophic gastritis with B12 deficiency - he had been doing well on iron and B12, continues to feel well. Will check CBC to ensure anemia is stable, also recheck his iron stores and B12 levels. He agreed, will let him know results.   Atrophic gastritis / gastric intestinal metaplasia - discussed that this can increase his risk for gastric cancer over time, he has no family history of stomach cancer. Given his age and the risks of surveillance may likely outweigh the benefits, I don't feel strongly he warrants another EGD. His last EGD was less than 2 years ago, we can reassess him in one year.  Barrett's esophagus - short segment noted on EGD, he is on PPI and tolerating it well, he will continue this for now. Again as above, likely will not continue with surveillance given his age.  Oronogo Cellar, MD [facility]

## 2018-03-16 LAB — IRON, TOTAL/TOTAL IRON BINDING CAP
%SAT: 13 % (calc) — ABNORMAL LOW (ref 20–48)
Iron: 44 ug/dL — ABNORMAL LOW (ref 50–180)
TIBC: 327 mcg/dL (calc) (ref 250–425)

## 2018-03-19 ENCOUNTER — Other Ambulatory Visit: Payer: Self-pay

## 2018-03-19 DIAGNOSIS — D649 Anemia, unspecified: Secondary | ICD-10-CM

## 2018-04-04 ENCOUNTER — Other Ambulatory Visit: Payer: Self-pay | Admitting: Interventional Cardiology

## 2018-04-04 ENCOUNTER — Ambulatory Visit (INDEPENDENT_AMBULATORY_CARE_PROVIDER_SITE_OTHER): Payer: Medicare HMO | Admitting: *Deleted

## 2018-04-04 DIAGNOSIS — E538 Deficiency of other specified B group vitamins: Secondary | ICD-10-CM | POA: Diagnosis not present

## 2018-04-04 MED ORDER — NITROGLYCERIN 0.4 MG SL SUBL: 0.4000 mg | SUBLINGUAL_TABLET | SUBLINGUAL | 1 refills | Status: AC | PRN

## 2018-04-04 MED ORDER — CYANOCOBALAMIN 1000 MCG/ML IJ SOLN
1000.0000 ug | Freq: Once | INTRAMUSCULAR | Status: AC
  Administered 2018-04-04: 1000 ug via INTRAMUSCULAR

## 2018-04-04 NOTE — Progress Notes (Signed)
Per orders of Dr. Hernandez, injection of Cyanocobalamin 1000mcg given by Funderburk, Jo A. Patient tolerated injection well. 

## 2018-04-05 ENCOUNTER — Ambulatory Visit: Payer: Medicare HMO

## 2018-04-16 DIAGNOSIS — E119 Type 2 diabetes mellitus without complications: Secondary | ICD-10-CM | POA: Diagnosis not present

## 2018-04-16 DIAGNOSIS — H52203 Unspecified astigmatism, bilateral: Secondary | ICD-10-CM | POA: Diagnosis not present

## 2018-04-16 DIAGNOSIS — H401132 Primary open-angle glaucoma, bilateral, moderate stage: Secondary | ICD-10-CM | POA: Diagnosis not present

## 2018-04-16 DIAGNOSIS — H35371 Puckering of macula, right eye: Secondary | ICD-10-CM | POA: Diagnosis not present

## 2018-04-16 LAB — HM DIABETES EYE EXAM

## 2018-04-18 ENCOUNTER — Encounter: Payer: Self-pay | Admitting: Internal Medicine

## 2018-05-02 ENCOUNTER — Ambulatory Visit: Payer: Medicare HMO

## 2018-05-02 DIAGNOSIS — E611 Iron deficiency: Secondary | ICD-10-CM | POA: Diagnosis not present

## 2018-05-02 DIAGNOSIS — N184 Chronic kidney disease, stage 4 (severe): Secondary | ICD-10-CM | POA: Diagnosis not present

## 2018-05-02 DIAGNOSIS — D631 Anemia in chronic kidney disease: Secondary | ICD-10-CM | POA: Diagnosis not present

## 2018-05-02 DIAGNOSIS — I129 Hypertensive chronic kidney disease with stage 1 through stage 4 chronic kidney disease, or unspecified chronic kidney disease: Secondary | ICD-10-CM | POA: Diagnosis not present

## 2018-05-02 DIAGNOSIS — N2581 Secondary hyperparathyroidism of renal origin: Secondary | ICD-10-CM | POA: Diagnosis not present

## 2018-05-03 ENCOUNTER — Ambulatory Visit (INDEPENDENT_AMBULATORY_CARE_PROVIDER_SITE_OTHER): Payer: Medicare HMO | Admitting: *Deleted

## 2018-05-03 DIAGNOSIS — E538 Deficiency of other specified B group vitamins: Secondary | ICD-10-CM | POA: Diagnosis not present

## 2018-05-03 MED ORDER — CYANOCOBALAMIN 1000 MCG/ML IJ SOLN
1000.0000 ug | Freq: Once | INTRAMUSCULAR | Status: AC
  Administered 2018-05-03: 1000 ug via INTRAMUSCULAR

## 2018-05-03 NOTE — Progress Notes (Signed)
Patient here for b12 injection. Injection tolerated well

## 2018-06-06 ENCOUNTER — Ambulatory Visit: Payer: Medicare HMO | Admitting: Internal Medicine

## 2018-06-06 ENCOUNTER — Ambulatory Visit: Payer: Medicare HMO

## 2018-06-13 ENCOUNTER — Other Ambulatory Visit: Payer: Self-pay

## 2018-06-13 ENCOUNTER — Ambulatory Visit (INDEPENDENT_AMBULATORY_CARE_PROVIDER_SITE_OTHER): Payer: Medicare HMO | Admitting: Internal Medicine

## 2018-06-13 DIAGNOSIS — E785 Hyperlipidemia, unspecified: Secondary | ICD-10-CM | POA: Diagnosis not present

## 2018-06-13 DIAGNOSIS — N184 Chronic kidney disease, stage 4 (severe): Secondary | ICD-10-CM

## 2018-06-13 DIAGNOSIS — I5042 Chronic combined systolic (congestive) and diastolic (congestive) heart failure: Secondary | ICD-10-CM | POA: Diagnosis not present

## 2018-06-13 DIAGNOSIS — I25709 Atherosclerosis of coronary artery bypass graft(s), unspecified, with unspecified angina pectoris: Secondary | ICD-10-CM | POA: Diagnosis not present

## 2018-06-13 DIAGNOSIS — E1122 Type 2 diabetes mellitus with diabetic chronic kidney disease: Secondary | ICD-10-CM

## 2018-06-13 DIAGNOSIS — Z794 Long term (current) use of insulin: Secondary | ICD-10-CM | POA: Diagnosis not present

## 2018-06-13 DIAGNOSIS — I10 Essential (primary) hypertension: Secondary | ICD-10-CM

## 2018-06-13 DIAGNOSIS — E538 Deficiency of other specified B group vitamins: Secondary | ICD-10-CM

## 2018-06-13 DIAGNOSIS — K279 Peptic ulcer, site unspecified, unspecified as acute or chronic, without hemorrhage or perforation: Secondary | ICD-10-CM | POA: Diagnosis not present

## 2018-06-13 NOTE — Progress Notes (Signed)
Virtual Visit via Video Note  I connected with [name] on 06/13/18 at 11:00 AM EDT by a video enabled telemedicine application and verified that I am speaking with the correct person using two identifiers.  Location patient: home Location provider: work office Persons participating in the virtual visit: patient, provider  I discussed the limitations of evaluation and management by telemedicine and the availability of in person appointments. The patient expressed understanding and agreed to proceed.   HPI: This is a scheduled visit for follow up of chronic medical conditions. He has been doing well since I last saw him. No acute complaints. [facility] took him of omeprazole due to ?concerns with his kidney function.  Past Medical History is significant for: Chronic combined heart failure with a known ejection fraction of 30 to 35% and grade 2 diastolic dysfunction, hypertension, coronary artery disease, diabetes which is insulin-dependent,, chronic kidney disease stage IV, B12 deficiency.  His coronary artery disease and chronic combined heart failure is well controlled, follows with cardiology routinely on a biannual to annual basis, his stage IV kidney disease is also followed by nephrology and has been stable (patient states he would never want to go on dialysis), his diabetes is well controlled on insulin, he takes Lantus 12 units at bedtime as well as meal coverage NovoLog 14 units 3 times a day.   ROS: Constitutional: Denies fever, chills, diaphoresis, appetite change and fatigue.  HEENT: Denies photophobia, eye pain, redness, hearing loss, ear pain, congestion, sore throat, rhinorrhea, sneezing, mouth sores, trouble swallowing, neck pain, neck stiffness and tinnitus.   Respiratory: Denies SOB, DOE, cough, chest tightness,  and wheezing.   Cardiovascular: Denies chest pain, palpitations and leg swelling.  Gastrointestinal: Denies nausea, vomiting, abdominal pain,  diarrhea, constipation, blood in stool and abdominal distention.  Genitourinary: Denies dysuria, urgency, frequency, hematuria, flank pain and difficulty urinating.  Endocrine: Denies: hot or cold intolerance, sweats, changes in hair or nails, polyuria, polydipsia. Musculoskeletal: Denies myalgias, back pain, joint swelling, arthralgias and gait problem.  Skin: Denies pallor, rash and wound.  Neurological: Denies dizziness, seizures, syncope, weakness, light-headedness, numbness and headaches.  Hematological: Denies adenopathy. Easy bruising, personal or family bleeding history  Psychiatric/Behavioral: Denies suicidal ideation, mood changes, confusion, nervousness, sleep disturbance and agitation   Past Medical History:  Diagnosis Date  . Arthritis    "touch in my fingers" (09/10/2014)  . Atrophic gastritis   . AVM (arteriovenous malformation) of duodenum, acquired   . B12 deficiency anemia    "stopped taking the shots in ~ 06/2014" (09/10/2014)  . Barrett's esophagus   . CAD (coronary artery disease)    a. H/o MI in 1998;  b. s/p CABG 2003. c. 10/2014 NSTEMI/Cath: LM 75, LAD 13m/d, D1 95, D2 50, LCX 100ost/p, OM1 80, OM2 80, RCA 100p/m, RPDA fills via L->L collats, LIMA->LAD ok, VG->dRCA 100, VG->OM1->OM2 99 prox to OM1 insertion->recanalated ->Tx with heparin,  VG->D1 100-->Med Rx.  . Cataract   . Chronic systolic CHF (congestive heart failure) (HCC)    a. EF 40-45% in 10/2014 (previously normal in 08/2014).  . CKD (chronic kidney disease), stage IV ([place])   . Diabetes 1.5, managed as type 1 ([place])   . Hiatal hernia 2018   endoscopy -Dr. Havery Moros  . History of stomach ulcers 2013  . Hyperlipidemia   . Hypertension   . Hypertensive heart disease   . Ischemic cardiomyopathy    a. 10/2014 Echo: EF 40-45%.  . Myocardial infarction ([place])   .  PVD (peripheral vascular disease) ([place])   . Type II diabetes mellitus ([place])     Past Surgical History:  Procedure Laterality Date  . CARDIAC  CATHETERIZATION  2003;   . CARDIAC CATHETERIZATION N/A 11/07/2014   Left Heart Cath; Belva Crome, MD;   . CATARACT EXTRACTION, BILATERAL Bilateral   . CHOLECYSTECTOMY N/A 05/20/2015   Procedure: LAPAROSCOPIC CHOLECYSTECTOMY;  Surgeon: Coralie Keens, MD;  Location: [facility];  Service: General;  Laterality: N/A;  . COLONOSCOPY    . COLONOSCOPY N/A 05/12/2015   Procedure: COLONOSCOPY;  Surgeon: Milus Banister, MD;  Location: [facility];  Service: Endoscopy;  Laterality: N/A;  . [place]   [name] 07/13/2010  . CORONARY ARTERY BYPASS GRAFT  2003   CABG X5  . ESOPHAGOGASTRODUODENOSCOPY  06/10/2011   Procedure: ESOPHAGOGASTRODUODENOSCOPY (EGD);  Surgeon: Ladene Artist, MD,FACG;  Location: [facility] ENDOSCOPY;  Service: Endoscopy;  Laterality: N/A;  . ESOPHAGOGASTRODUODENOSCOPY N/A 10/04/2016   Procedure: ESOPHAGOGASTRODUODENOSCOPY (EGD);  Surgeon: Manus Gunning, MD;  Location[facility];  Service: Gastroenterology;  Laterality: N/A;    Family History  Problem Relation Age of Onset  . Stroke Mother   . Hypertension Mother   . Cancer Mother   . Diabetes Other        brothers and sisters  . Coronary artery disease Other   . Hypertension Sister   . Heart attack Sister   . Colon cancer Neg Hx     SOCIAL HX:   reports that he has quit smoking. His smoking use included cigarettes. He has a 10.00 pack-year smoking history. He has never used smokeless tobacco. He reports that he does not drink alcohol or use drugs.   Current Outpatient Medications:  .  ACCU-CHEK SOFTCLIX LANCETS lancets, CHECK BLOOD GLUCOSE THREE TIMES DAILY, Disp: 100 each, Rfl: 0 .  acetaminophen (TYLENOL) 500 MG tablet, Take 500 mg by mouth every 8 (eight) hours as needed for mild pain or headache., Disp: , Rfl:  .  Alcohol Swabs (ALCOHOL PREP) PADS, USE TO TEST BLOOD GLUCOSE THREE TIMES DAILY, Disp: 300 each, Rfl: 3 .  amLODipine (NORVASC) 10 MG tablet, TAKE 1 TABLET EVERY DAY, Disp: 90 tablet, Rfl:  0 .  aspirin EC 81 MG tablet, Take 81 mg by mouth at bedtime., Disp: , Rfl:  .  atorvastatin (LIPITOR) 80 MG tablet, Take 1 tablet (80 mg total) by mouth daily at 6 PM., Disp: 90 tablet, Rfl: 3 .  BD PEN NEEDLE NANO U/F 32G X 4 MM MISC, INJECT TWO TIMES DAILY AS NEEDED., Disp: 180 each, Rfl: 3 .  bisacodyl (DULCOLAX) 5 MG EC tablet, Take 5 mg by mouth daily as needed for moderate constipation., Disp: , Rfl:  .  carvedilol (COREG) 25 MG tablet, TAKE 1 TABLET TWICE DAILY WITH MEALS, Disp: 180 tablet, Rfl: 3 .  cloNIDine (CATAPRES) 0.1 MG tablet, TAKE 1 TABLET AT BEDTIME, Disp: 90 tablet, Rfl: 3 .  Ferrous Sulfate (IRON) 325 (65 Fe) MG TABS, Take 1 tablet by mouth daily., Disp: 30 each, Rfl: 0 .  furosemide (LASIX) 40 MG tablet, TAKE 1 AND 1/2 TABLETS EVERY MORNING  AND TAKE 1 TABLET EVERY EVENING, Disp: 225 tablet, Rfl: 3 .  glucose blood (ACCU-CHEK AVIVA PLUS) test strip, USE TO TEST BLOOD GLUCOSE THREE TIMES DAILY, Disp: 300 each, Rfl: 3 .  hydrALAZINE (APRESOLINE) 25 MG tablet, TAKE 1 TABLET (25 MG TOTAL) 3 TIMES DAILY., Disp: 270 tablet, Rfl: 3 .  insulin aspart (NOVOLOG FLEXPEN) 100  UNIT/ML FlexPen, Inject 14 Units into the skin 3 (three) times daily with meals., Disp: , Rfl:  .  Insulin Glargine (LANTUS SOLOSTAR) 100 UNIT/ML Solostar Pen, Inject 12 Units into the skin daily at 10 pm., Disp: 15 mL, Rfl: 3 .  isosorbide mononitrate (IMDUR) 60 MG 24 hr tablet, TAKE 2 TABLETS EVERY DAY, Disp: 180 tablet, Rfl: 3 .  Lancet Devices (ACCU-CHEK SOFTCLIX) lancets, USE TO TEST BLOOD GLUCOSE THREE TIMES DAILY, Disp: 1 each, Rfl: 0 .  latanoprost (XALATAN) 0.005 % ophthalmic solution, Place 1 drop into both eyes at bedtime., Disp: , Rfl:  .  nitroGLYCERIN (NITROSTAT) 0.4 MG SL tablet, Place 1 tablet (0.4 mg total) under the tongue every 5 (five) minutes as needed for chest pain (3 doses MAX)., Disp: 75 tablet, Rfl: 1 .  pentoxifylline (TRENTAL) 400 MG CR tablet, Take 400 mg by mouth 2 (two) times daily.  (Take at 10 am and 5 pm), Disp: , Rfl:  .  polyethylene glycol (MIRALAX / GLYCOLAX) packet, Take 17 g by mouth daily as needed for mild constipation or moderate constipation., Disp: 30 each, Rfl: 1 .  predniSONE (DELTASONE) 10 MG tablet, Take 1 tablet (10 mg total) by mouth daily with breakfast., Disp: 10 tablet, Rfl: 0 .  timolol (TIMOPTIC) 0.5 % ophthalmic solution, Place 1 drop into both eyes every morning., Disp: , Rfl:  .  TRAVATAN Z 0.004 % SOLN ophthalmic solution, Place 1 drop into both eyes every evening., Disp: , Rfl:   EXAM:   VITALS per patient if applicable: BP of 127/51 taken at home today  GENERAL: alert, oriented, appears well and in no acute distress  HEENT: atraumatic, conjunttiva clear, no obvious abnormalities on inspection of external nose and ears  NECK: normal movements of the head and neck  LUNGS: on inspection no signs of respiratory distress, breathing rate appears normal, no obvious gross increased work of breathing, gasping or wheezing  CV: no obvious cyanosis  MS: moves all visible extremities without noticeable abnormality  PSYCH/NEURO: pleasant and cooperative, no obvious depression or anxiety, speech and thought processing grossly intact  ASSESSMENT AND PLAN:   Type 2 diabetes mellitus with stage 4 chronic kidney disease, with long-term current use of insulin (HCC) -Well controlled at home. CBGs in the 130-135 range. -Last A1c was 6.5 in 12/19. -Due for A1c at next in person visit.  Dyslipidemia -Lipitor 80 mg daily. -Last LDL 45 in 6/19.  Essential hypertension -Well controlled per ambulatory BP monitoring.  Coronary artery disease involving 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 (HCC) -Stable, no CP, SOB, followed by cards on an annual basis.  Chronic combined systolic and diastolic heart failure ([place]) -Echo from March 2018 with ejection fraction of 30 to 35% and grade 2 diastolic dysfunction. -He is on Coreg,  atorvastatin, aspirin, Lasix, not on ACE inhibitor/ARB/ARN I due to advanced renal insufficiency. -He is compensated. Tells me he has not been SOB or had any LE edema.  CKD (chronic kidney disease), stage IV ([place]) -Advanced, follows with nephrology. -Baseline creatinine is between 3.3 and 3.5.  Peptic ulcer disease- 2013 -Taken off PPI by MD at [facility]. -No dyspepsia-type symptoms since discontinuation.  B12 deficiency -Will skip injection this month due to COVID-19 pandemic.     I discussed the assessment and treatment plan with the patient. The patient was provided an opportunity to ask questions and all were answered. The patient agreed with the plan and demonstrated an understanding of the instructions.  The patient was advised to call back or seek an in-person evaluation if the symptoms worsen or if the condition fails to improve as anticipated.    Lelon Frohlich, MD  [facility]

## 2018-06-21 ENCOUNTER — Telehealth: Payer: Self-pay | Admitting: Internal Medicine

## 2018-06-21 NOTE — Telephone Encounter (Signed)
Copied from [place] [id number]. Topic: Quick Communication - Rx Refill/Question >> Jun 21, 2018 11:43 AM Nathanial Millman J wrote: Medication:  pentoxifylline (TRENTAL) 400 MG CR tablet Has the patient contacted their pharmacy? Yes.   (Agent: If no, request that the patient contact the pharmacy for the refill.) (Agent: If yes, when and what did the pharmacy advise?)  Preferred Pharmacy (with phone number or street name): [facility]  Pt has 4 days tabs left  Agent: Please be advised that RX refills may take up to 3 business days. We ask that you follow-up with your pharmacy.

## 2018-06-22 MED ORDER — PENTOXIFYLLINE ER 400 MG PO TBCR: 400.0000 mg | EXTENDED_RELEASE_TABLET | Freq: Two times a day (BID) | ORAL | 1 refills | Status: DC

## 2018-06-22 NOTE — Telephone Encounter (Signed)
Ok to refill 

## 2018-07-02 ENCOUNTER — Telehealth: Payer: Self-pay | Admitting: Internal Medicine

## 2018-07-02 NOTE — Telephone Encounter (Signed)
Copied from [place] [phone]. Topic: Quick Communication - Rx Refill/Question >> Jul 02, 2018  3:12 PM Rayann Heman wrote: Medication: cloNIDine (CATAPRES) 0.1 MG tablet [[id number]]  Has the patient contacted their pharmacy? yes  Preferred Pharmacy (with phone number or street name):[facility] - [place], [place] - [address] [place] [phone] (Phone) [phone] (Fax)    Agent: Please be advised that RX refills may take up to 3 business days. We ask that you follow-up with your pharmacy.

## 2018-07-03 ENCOUNTER — Other Ambulatory Visit: Payer: Self-pay | Admitting: *Deleted

## 2018-07-03 MED ORDER — GLUCOSE BLOOD VI STRP: ORAL_STRIP | 3 refills | Status: DC

## 2018-07-03 MED ORDER — CLONIDINE HCL 0.1 MG PO TABS: 0.1000 mg | ORAL_TABLET | Freq: Every day | ORAL | 1 refills | Status: DC

## 2018-07-03 NOTE — Telephone Encounter (Signed)
Refill sent.

## 2018-07-13 ENCOUNTER — Other Ambulatory Visit: Payer: Self-pay | Admitting: Interventional Cardiology

## 2018-07-28 ENCOUNTER — Other Ambulatory Visit: Payer: Self-pay | Admitting: Interventional Cardiology

## 2018-07-30 ENCOUNTER — Telehealth: Payer: Self-pay

## 2018-07-30 NOTE — Telephone Encounter (Signed)
lvm for pt to rs awv to July crm created

## 2018-08-08 ENCOUNTER — Ambulatory Visit: Payer: Medicare HMO

## 2018-08-11 ENCOUNTER — Other Ambulatory Visit: Payer: Self-pay

## 2018-08-11 ENCOUNTER — Encounter (HOSPITAL_BASED_OUTPATIENT_CLINIC_OR_DEPARTMENT_OTHER): Payer: Self-pay | Admitting: Emergency Medicine

## 2018-08-11 ENCOUNTER — Inpatient Hospital Stay (HOSPITAL_BASED_OUTPATIENT_CLINIC_OR_DEPARTMENT_OTHER)
Admission: EM | Admit: 2018-08-11 | Discharge: 2018-08-13 | DRG: 291 | Disposition: A | Discharge status: Home / Self Care | Payer: Medicare HMO | Attending: Internal Medicine | Admitting: Internal Medicine

## 2018-08-11 ENCOUNTER — Emergency Department (HOSPITAL_BASED_OUTPATIENT_CLINIC_OR_DEPARTMENT_OTHER): Payer: Medicare HMO

## 2018-08-11 ENCOUNTER — Observation Stay (HOSPITAL_BASED_OUTPATIENT_CLINIC_OR_DEPARTMENT_OTHER): Payer: Medicare HMO

## 2018-08-11 DIAGNOSIS — I252 Old myocardial infarction: Secondary | ICD-10-CM

## 2018-08-11 DIAGNOSIS — E876 Hypokalemia: Secondary | ICD-10-CM | POA: Diagnosis not present

## 2018-08-11 DIAGNOSIS — I5043 Acute on chronic combined systolic (congestive) and diastolic (congestive) heart failure: Secondary | ICD-10-CM | POA: Diagnosis not present

## 2018-08-11 DIAGNOSIS — I2510 Atherosclerotic heart disease of native coronary artery without angina pectoris: Secondary | ICD-10-CM | POA: Diagnosis present

## 2018-08-11 DIAGNOSIS — Z9842 Cataract extraction status, left eye: Secondary | ICD-10-CM | POA: Diagnosis not present

## 2018-08-11 DIAGNOSIS — E1122 Type 2 diabetes mellitus with diabetic chronic kidney disease: Secondary | ICD-10-CM | POA: Diagnosis not present

## 2018-08-11 DIAGNOSIS — E1151 Type 2 diabetes mellitus with diabetic peripheral angiopathy without gangrene: Secondary | ICD-10-CM | POA: Diagnosis not present

## 2018-08-11 DIAGNOSIS — I10 Essential (primary) hypertension: Secondary | ICD-10-CM | POA: Diagnosis not present

## 2018-08-11 DIAGNOSIS — Z951 Presence of aortocoronary bypass graft: Secondary | ICD-10-CM

## 2018-08-11 DIAGNOSIS — I130 Hypertensive heart and chronic kidney disease with heart failure and stage 1 through stage 4 chronic kidney disease, or unspecified chronic kidney disease: Principal | ICD-10-CM | POA: Diagnosis present

## 2018-08-11 DIAGNOSIS — Z8711 Personal history of peptic ulcer disease: Secondary | ICD-10-CM | POA: Diagnosis not present

## 2018-08-11 DIAGNOSIS — I340 Nonrheumatic mitral (valve) insufficiency: Secondary | ICD-10-CM | POA: Diagnosis not present

## 2018-08-11 DIAGNOSIS — J9621 Acute and chronic respiratory failure with hypoxia: Secondary | ICD-10-CM | POA: Diagnosis not present

## 2018-08-11 DIAGNOSIS — Z794 Long term (current) use of insulin: Secondary | ICD-10-CM

## 2018-08-11 DIAGNOSIS — Z9861 Coronary angioplasty status: Secondary | ICD-10-CM | POA: Diagnosis not present

## 2018-08-11 DIAGNOSIS — I509 Heart failure, unspecified: Secondary | ICD-10-CM

## 2018-08-11 DIAGNOSIS — Z7982 Long term (current) use of aspirin: Secondary | ICD-10-CM

## 2018-08-11 DIAGNOSIS — IMO0001 Reserved for inherently not codable concepts without codable children: Secondary | ICD-10-CM

## 2018-08-11 DIAGNOSIS — E785 Hyperlipidemia, unspecified: Secondary | ICD-10-CM | POA: Diagnosis not present

## 2018-08-11 DIAGNOSIS — Z9049 Acquired absence of other specified parts of digestive tract: Secondary | ICD-10-CM | POA: Diagnosis not present

## 2018-08-11 DIAGNOSIS — R0602 Shortness of breath: Secondary | ICD-10-CM | POA: Diagnosis not present

## 2018-08-11 DIAGNOSIS — Z79899 Other long term (current) drug therapy: Secondary | ICD-10-CM

## 2018-08-11 DIAGNOSIS — I5042 Chronic combined systolic (congestive) and diastolic (congestive) heart failure: Secondary | ICD-10-CM

## 2018-08-11 DIAGNOSIS — N184 Chronic kidney disease, stage 4 (severe): Secondary | ICD-10-CM | POA: Diagnosis not present

## 2018-08-11 DIAGNOSIS — I5023 Acute on chronic systolic (congestive) heart failure: Secondary | ICD-10-CM | POA: Diagnosis present

## 2018-08-11 DIAGNOSIS — N185 Chronic kidney disease, stage 5: Secondary | ICD-10-CM | POA: Diagnosis present

## 2018-08-11 DIAGNOSIS — I255 Ischemic cardiomyopathy: Secondary | ICD-10-CM | POA: Diagnosis not present

## 2018-08-11 DIAGNOSIS — Z9841 Cataract extraction status, right eye: Secondary | ICD-10-CM | POA: Diagnosis not present

## 2018-08-11 DIAGNOSIS — M109 Gout, unspecified: Secondary | ICD-10-CM | POA: Diagnosis present

## 2018-08-11 DIAGNOSIS — Z20828 Contact with and (suspected) exposure to other viral communicable diseases: Secondary | ICD-10-CM | POA: Diagnosis present

## 2018-08-11 DIAGNOSIS — E119 Type 2 diabetes mellitus without complications: Secondary | ICD-10-CM | POA: Diagnosis not present

## 2018-08-11 DIAGNOSIS — I110 Hypertensive heart disease with heart failure: Secondary | ICD-10-CM | POA: Diagnosis not present

## 2018-08-11 LAB — GLUCOSE, CAPILLARY
Glucose-Capillary: 158 mg/dL — ABNORMAL HIGH (ref 70–99)
Glucose-Capillary: 192 mg/dL — ABNORMAL HIGH (ref 70–99)

## 2018-08-11 LAB — COMPREHENSIVE METABOLIC PANEL
ALT: 20 U/L (ref 0–44)
AST: 19 U/L (ref 15–41)
Albumin: 4.4 g/dL (ref 3.5–5.0)
Alkaline Phosphatase: 74 U/L (ref 38–126)
Anion gap: 11 (ref 5–15)
BUN: 61 mg/dL — ABNORMAL HIGH (ref 8–23)
CO2: 21 mmol/L — ABNORMAL LOW (ref 22–32)
Calcium: 9.1 mg/dL (ref 8.9–10.3)
Chloride: 105 mmol/L (ref 98–111)
Creatinine, Ser: 3.92 mg/dL — ABNORMAL HIGH (ref 0.61–1.24)
GFR calc Af Amer: 15 mL/min — ABNORMAL LOW (ref >60)
GFR calc non Af Amer: 13 mL/min — ABNORMAL LOW (ref >60)
Glucose, Bld: 189 mg/dL — ABNORMAL HIGH (ref 70–99)
Potassium: 3.8 mmol/L (ref 3.5–5.1)
Sodium: 137 mmol/L (ref 135–145)
Total Bilirubin: 1 mg/dL (ref 0.3–1.2)
Total Protein: 8 g/dL (ref 6.5–8.1)

## 2018-08-11 LAB — CBC WITH DIFFERENTIAL/PLATELET
Abs Immature Granulocytes: 0.11 10*3/uL — ABNORMAL HIGH (ref 0.00–0.07)
Basophils Absolute: 0 10*3/uL (ref 0.0–0.1)
Basophils Relative: 0 %
Eosinophils Absolute: 0.3 10*3/uL (ref 0.0–0.5)
Eosinophils Relative: 3 %
HCT: 35.9 % — ABNORMAL LOW (ref 39.0–52.0)
Hemoglobin: 11.5 g/dL — ABNORMAL LOW (ref 13.0–17.0)
Immature Granulocytes: 1 %
Lymphocytes Relative: 8 %
Lymphs Abs: 0.8 10*3/uL (ref 0.7–4.0)
MCH: 31 pg (ref 26.0–34.0)
MCHC: 32 g/dL (ref 30.0–36.0)
MCV: 96.8 fL (ref 80.0–100.0)
Monocytes Absolute: 0.5 10*3/uL (ref 0.1–1.0)
Monocytes Relative: 5 %
Neutro Abs: 8.7 10*3/uL — ABNORMAL HIGH (ref 1.7–7.7)
Neutrophils Relative %: 83 %
Platelets: 162 10*3/uL (ref 150–400)
RBC: 3.71 MIL/uL — ABNORMAL LOW (ref 4.22–5.81)
RDW: 14.5 % (ref 11.5–15.5)
WBC: 10.5 10*3/uL (ref 4.0–10.5)
nRBC: 0 % (ref 0.0–0.2)

## 2018-08-11 LAB — ECHOCARDIOGRAM COMPLETE
Height: 70 in
Weight: 2654.4 oz

## 2018-08-11 LAB — HEMOGLOBIN A1C
Hgb A1c MFr Bld: 7.5 % — ABNORMAL HIGH (ref 4.8–5.6)
Mean Plasma Glucose: 168.55 mg/dL

## 2018-08-11 LAB — SARS CORONAVIRUS 2: SARS Coronavirus 2: NOT DETECTED

## 2018-08-11 LAB — BRAIN NATRIURETIC PEPTIDE: B Natriuretic Peptide: 969 pg/mL — ABNORMAL HIGH (ref 0.0–100.0)

## 2018-08-11 LAB — SARS CORONAVIRUS 2 AG (30 MIN TAT): SARS Coronavirus 2 Ag: NEGATIVE

## 2018-08-11 LAB — TROPONIN I: Troponin I: 0.05 ng/mL (ref <0.03)

## 2018-08-11 MED ORDER — BISACODYL 5 MG PO TBEC
5.0000 mg | DELAYED_RELEASE_TABLET | Freq: Every day | ORAL | Status: DC | PRN
  Administered 2018-08-12: 5 mg via ORAL
  Filled 2018-08-11: qty 1

## 2018-08-11 MED ORDER — SODIUM CHLORIDE 0.9% FLUSH
3.0000 mL | Freq: Two times a day (BID) | INTRAVENOUS | Status: DC
  Administered 2018-08-11 – 2018-08-12 (×4): 3 mL via INTRAVENOUS

## 2018-08-11 MED ORDER — FUROSEMIDE 10 MG/ML IJ SOLN
80.0000 mg | Freq: Once | INTRAMUSCULAR | Status: AC
  Administered 2018-08-11: 80 mg via INTRAVENOUS
  Filled 2018-08-11: qty 8

## 2018-08-11 MED ORDER — AMLODIPINE BESYLATE 10 MG PO TABS
10.0000 mg | ORAL_TABLET | Freq: Every day | ORAL | Status: DC
  Administered 2018-08-12 – 2018-08-13 (×2): 10 mg via ORAL
  Filled 2018-08-11 (×2): qty 1

## 2018-08-11 MED ORDER — ATORVASTATIN CALCIUM 80 MG PO TABS
80.0000 mg | ORAL_TABLET | Freq: Every day | ORAL | Status: DC
  Administered 2018-08-11 – 2018-08-13 (×3): 80 mg via ORAL
  Filled 2018-08-11 (×3): qty 1

## 2018-08-11 MED ORDER — INSULIN ASPART 100 UNIT/ML ~~LOC~~ SOLN: 0.0000 [IU] | Freq: Every day | SUBCUTANEOUS | Status: DC

## 2018-08-11 MED ORDER — SODIUM CHLORIDE 0.9 % IV SOLN: 250.0000 mL | INTRAVENOUS | Status: DC | PRN

## 2018-08-11 MED ORDER — ACETAMINOPHEN 325 MG PO TABS
650.0000 mg | ORAL_TABLET | ORAL | Status: DC | PRN
  Administered 2018-08-11 – 2018-08-12 (×2): 650 mg via ORAL
  Filled 2018-08-11 (×2): qty 2

## 2018-08-11 MED ORDER — INSULIN ASPART 100 UNIT/ML ~~LOC~~ SOLN
0.0000 [IU] | Freq: Three times a day (TID) | SUBCUTANEOUS | Status: DC
  Administered 2018-08-11 – 2018-08-12 (×2): 2 [IU] via SUBCUTANEOUS
  Administered 2018-08-12: 1 [IU] via SUBCUTANEOUS
  Administered 2018-08-12 – 2018-08-13 (×3): 2 [IU] via SUBCUTANEOUS
  Administered 2018-08-13: 1 [IU] via SUBCUTANEOUS

## 2018-08-11 MED ORDER — ONDANSETRON HCL 4 MG/2ML IJ SOLN: 4.0000 mg | Freq: Four times a day (QID) | INTRAMUSCULAR | Status: DC | PRN

## 2018-08-11 MED ORDER — INSULIN GLARGINE 100 UNIT/ML ~~LOC~~ SOLN
12.0000 [IU] | Freq: Every day | SUBCUTANEOUS | Status: DC
  Administered 2018-08-11 – 2018-08-12 (×2): 12 [IU] via SUBCUTANEOUS
  Filled 2018-08-11 (×3): qty 0.12

## 2018-08-11 MED ORDER — TIMOLOL MALEATE 0.5 % OP SOLN
1.0000 [drp] | Freq: Every morning | OPHTHALMIC | Status: DC
  Administered 2018-08-12 – 2018-08-13 (×2): 1 [drp] via OPHTHALMIC
  Filled 2018-08-11: qty 5

## 2018-08-11 MED ORDER — PREDNISONE 10 MG PO TABS: 10.0000 mg | ORAL_TABLET | Freq: Every day | ORAL | Status: DC

## 2018-08-11 MED ORDER — ENOXAPARIN SODIUM 30 MG/0.3ML ~~LOC~~ SOLN
30.0000 mg | SUBCUTANEOUS | Status: DC
  Administered 2018-08-11 – 2018-08-12 (×2): 30 mg via SUBCUTANEOUS
  Filled 2018-08-11 (×3): qty 0.3

## 2018-08-11 MED ORDER — CARVEDILOL 25 MG PO TABS
25.0000 mg | ORAL_TABLET | Freq: Two times a day (BID) | ORAL | Status: DC
  Administered 2018-08-11 – 2018-08-13 (×5): 25 mg via ORAL
  Filled 2018-08-11 (×5): qty 1

## 2018-08-11 MED ORDER — LATANOPROST 0.005 % OP SOLN
1.0000 [drp] | Freq: Every day | OPHTHALMIC | Status: DC
  Administered 2018-08-11 – 2018-08-12 (×2): 1 [drp] via OPHTHALMIC
  Filled 2018-08-11: qty 2.5

## 2018-08-11 MED ORDER — PENTOXIFYLLINE ER 400 MG PO TBCR
400.0000 mg | EXTENDED_RELEASE_TABLET | Freq: Two times a day (BID) | ORAL | Status: DC
  Administered 2018-08-11 – 2018-08-13 (×4): 400 mg via ORAL
  Filled 2018-08-11 (×5): qty 1

## 2018-08-11 MED ORDER — POLYETHYLENE GLYCOL 3350 17 G PO PACK: 17.0000 g | PACK | Freq: Every day | ORAL | Status: DC | PRN

## 2018-08-11 MED ORDER — ASPIRIN EC 81 MG PO TBEC
81.0000 mg | DELAYED_RELEASE_TABLET | Freq: Every day | ORAL | Status: DC
  Administered 2018-08-11 – 2018-08-12 (×2): 81 mg via ORAL
  Filled 2018-08-11 (×2): qty 1

## 2018-08-11 MED ORDER — FUROSEMIDE 10 MG/ML IJ SOLN
40.0000 mg | Freq: Two times a day (BID) | INTRAMUSCULAR | Status: AC
  Administered 2018-08-11 – 2018-08-13 (×5): 40 mg via INTRAVENOUS
  Filled 2018-08-11 (×6): qty 4

## 2018-08-11 MED ORDER — SODIUM CHLORIDE 0.9% FLUSH: 3.0000 mL | INTRAVENOUS | Status: DC | PRN

## 2018-08-11 MED ORDER — LATANOPROST 0.005 % OP SOLN: 1.0000 [drp] | Freq: Every day | OPHTHALMIC | Status: DC

## 2018-08-11 MED ORDER — CLONIDINE HCL 0.1 MG PO TABS
0.1000 mg | ORAL_TABLET | Freq: Every day | ORAL | Status: DC
  Administered 2018-08-11 – 2018-08-12 (×2): 0.1 mg via ORAL
  Filled 2018-08-11 (×2): qty 1

## 2018-08-11 MED ORDER — ISOSORBIDE MONONITRATE ER 60 MG PO TB24
120.0000 mg | ORAL_TABLET | Freq: Every day | ORAL | Status: DC
  Administered 2018-08-11 – 2018-08-13 (×3): 120 mg via ORAL
  Filled 2018-08-11 (×3): qty 2

## 2018-08-11 MED ORDER — HYDRALAZINE HCL 25 MG PO TABS
25.0000 mg | ORAL_TABLET | Freq: Three times a day (TID) | ORAL | Status: DC
  Administered 2018-08-11 – 2018-08-13 (×6): 25 mg via ORAL
  Filled 2018-08-11 (×6): qty 1

## 2018-08-11 NOTE — H&P (Signed)
History and Physical    [name] MPN:[id number] DOB: [birth year] DOA: 08/11/2018  PCP: [name], [name], MD Consultants:  Joelyn Oms - Nephrology, Tamala Julian - cardiology Patient coming from:  Home - lives alone; NOK: Paulina Fusi, [phone]; [place], [phone]  Chief Complaint: SOB  HPI: [name] is a 85 y.o. male with medical history significant of HTN, HLD, CAD, DM, PVD, CKD stage IV, PUD, GERD, and AVMs; who presented to [facility] with complaints of shortness of breath.  He reports "getting short-winded, found out that I had fluid on my lungs."  Symptoms started last night while sitting up in a chair.  He felt ok yesterday.  He feels better sitting up than lying down.  No weight changes.  No LE edema.  No sick contacts.  He has been hospitalized before for bypass surgery.  No chest pain.  No cough.    ED Course:  Accepted in transfer by Dr. Tamala Julian:  BUN 61, creatinine 3.92, BNP 969, and troponin 0.05. Patient noted to be hypoxic down to 86% on room air currently on 4 L to maintain O2 saturations greater than 92%.  CXR new bilateral opacities concerning for pulmonary edema with small bilateral effusions.  Patient was given 80 mg of Lasix IV.  Review of Systems: As per HPI; otherwise review of systems reviewed and negative.   Ambulatory Status:  Ambulates without assistance  Past Medical History:  Diagnosis Date  . Arthritis    "touch in my fingers" (09/10/2014)  . Atrophic gastritis   . AVM (arteriovenous malformation) of duodenum, acquired   . B12 deficiency anemia    "stopped taking the shots in ~ 06/2014" (09/10/2014)  . Barrett's esophagus   . CAD (coronary artery disease)    a. H/o MI in 1998;  b. s/p CABG 2003. c. 10/2014 NSTEMI/Cath: LM 75, LAD 155m/d, D1 95, D2 50, LCX 100ost/p, OM1 80, OM2 80, RCA 100p/m, RPDA fills via L->L collats, LIMA->LAD ok, VG->dRCA 100, VG->OM1->OM2 99 prox to OM1 insertion->recanalated ->Tx with heparin,  VG->D1 100-->Med Rx.  .  Cataract   . Chronic systolic CHF (congestive heart failure) (HCC)    a. EF 40-45% in 10/2014 (previously normal in 08/2014).  . CKD (chronic kidney disease), stage IV ([place])   . Diabetes 1.5, managed as type 1 ([place])   . Hiatal hernia 2018   endoscopy -Dr. Havery Moros  . History of stomach ulcers 2013  . Hyperlipidemia   . Hypertension   . Hypertensive heart disease   . Ischemic cardiomyopathy    a. 10/2014 Echo: EF 40-45%.  . Myocardial infarction ([place])   . PVD (peripheral vascular disease) ([place])   . Type II diabetes mellitus ([place])     Past Surgical History:  Procedure Laterality Date  . CARDIAC CATHETERIZATION  2003;   . CARDIAC CATHETERIZATION N/A 11/07/2014   Left Heart Cath; Belva Crome, MD;   . CATARACT EXTRACTION, BILATERAL Bilateral   . CHOLECYSTECTOMY N/A 05/20/2015   Procedure: LAPAROSCOPIC CHOLECYSTECTOMY;  Surgeon: Coralie Keens, MD;  Location: [place];  Service: General;  Laterality: N/A;  . COLONOSCOPY    . COLONOSCOPY N/A 05/12/2015   Procedure: COLONOSCOPY;  Surgeon: Milus Banister, MD;  Location: [facility];  Service: Endoscopy;  Laterality: N/A;  . [place]   [name] 07/13/2010  . CORONARY ARTERY BYPASS GRAFT  2003   CABG X5  . ESOPHAGOGASTRODUODENOSCOPY  06/10/2011   Procedure: ESOPHAGOGASTRODUODENOSCOPY (EGD);  Surgeon: Ladene Artist, MD,FACG;  Location: [facility];  Service: Endoscopy;  Laterality: N/A;  . ESOPHAGOGASTRODUODENOSCOPY N/A 10/04/2016   Procedure: ESOPHAGOGASTRODUODENOSCOPY (EGD);  Surgeon: Manus Gunning, MD;  Location: [facility];  Service: Gastroenterology;  Laterality: N/A;    Social History   Socioeconomic History  . Marital status: Widowed    Spouse name: Not on file  . Number of children: Not on file  . Years of education: Not on file  . Highest education level: Not on file  Occupational History  . Occupation: Retired  Scientific laboratory technician  . Financial resource strain: Not on file  . Food insecurity     Worry: Not on file    Inability: Not on file  . Transportation needs    Medical: Not on file    Non-medical: Not on file  Tobacco Use  . Smoking status: Former Smoker    Packs/day: 0.50    Years: 20.00    Pack years: 10.00    Types: Cigarettes  . Smokeless tobacco: Never Used  . Tobacco comment: "quit smoking cigarettes in the 1960s   Substance and Sexual Activity  . Alcohol use: No  . Drug use: No  . Sexual activity: Not Currently  Lifestyle  . Physical activity    Days per week: Not on file    Minutes per session: Not on file  . Stress: Not on file  Relationships  . Social Herbalist on phone: Not on file    Gets together: Not on file    Attends religious service: Not on file    Active member of club or organization: Not on file    Attends meetings of clubs or organizations: Not on file    Relationship status: Not on file  . Intimate partner violence    Fear of current or ex partner: Not on file    Emotionally abused: Not on file    Physically abused: Not on file    Forced sexual activity: Not on file  Other Topics Concern  . Not on file  Social History Narrative   Pt lives alone, family nearby.    No Known Allergies  Family History  Problem Relation Age of Onset  . Stroke Mother   . Hypertension Mother   . Cancer Mother   . Diabetes Other        brothers and sisters  . Coronary artery disease Other   . Hypertension Sister   . Heart attack Sister   . Colon cancer Neg Hx     Prior to Admission medications   Medication Sig Start Date End Date Taking? Authorizing Provider  ACCU-CHEK SOFTCLIX LANCETS lancets CHECK BLOOD GLUCOSE THREE TIMES DAILY 04/18/17   Marletta Lor, MD  acetaminophen (TYLENOL) 500 MG tablet Take 500 mg by mouth every 8 (eight) hours as needed for mild pain or headache.    [provider]  Alcohol Swabs (ALCOHOL PREP) PADS USE TO TEST BLOOD GLUCOSE THREE TIMES DAILY 03/09/17   Marletta Lor, MD  amLODipine  (NORVASC) 10 MG tablet TAKE 1 TABLET EVERY DAY 01/01/18   Dorena Cookey, MD  aspirin EC 81 MG tablet Take 81 mg by mouth at bedtime.    [provider]  atorvastatin (LIPITOR) 80 MG tablet Take 1 tablet (80 mg total) by mouth daily at 6 PM. Pt needs to call and make appt before anymore refills 07/30/18   Belva Crome, MD  BD PEN NEEDLE NANO U/F 32G X 4 MM MISC INJECT TWO TIMES DAILY AS NEEDED.  08/23/17   Marletta Lor, MD  bisacodyl (DULCOLAX) 5 MG EC tablet Take 5 mg by mouth daily as needed for moderate constipation.    [provider]  carvedilol (COREG) 25 MG tablet TAKE 1 TABLET TWICE DAILY WITH MEALS 10/23/17   Marletta Lor, MD  cloNIDine (CATAPRES) 0.1 MG tablet Take 1 tablet (0.1 mg total) by mouth at bedtime. 07/03/18   [name], [name], MD  Ferrous Sulfate (IRON) 325 (65 Fe) MG TABS Take 1 tablet by mouth daily. 07/29/16   Marletta Lor, MD  furosemide (LASIX) 40 MG tablet TAKE 1 AND 1/2 TABLETS EVERY MORNING  AND TAKE 1 TABLET EVERY EVENING 07/13/18   Belva Crome, MD  glucose blood (ACCU-CHEK AVIVA PLUS) test strip USE TO TEST BLOOD GLUCOSE THREE TIMES DAILY 07/03/18   [name], [name], MD  hydrALAZINE (APRESOLINE) 25 MG tablet TAKE 1 TABLET (25 MG TOTAL) 3 TIMES DAILY. 11/07/17   Belva Crome, MD  insulin aspart (NOVOLOG FLEXPEN) 100 UNIT/ML FlexPen Inject 14 Units into the skin 3 (three) times daily with meals.    [provider]  Insulin Glargine (LANTUS SOLOSTAR) 100 UNIT/ML Solostar Pen Inject 12 Units into the skin daily at 10 pm. 11/02/17   Marletta Lor, MD  isosorbide mononitrate (IMDUR) 60 MG 24 hr tablet TAKE 2 TABLETS EVERY DAY 07/13/18   Belva Crome, MD  Lancet Devices [facility]) lancets USE TO TEST BLOOD GLUCOSE THREE TIMES DAILY 03/09/17   Marletta Lor, MD  latanoprost (XALATAN) 0.005 % ophthalmic solution Place 1 drop into both eyes at bedtime. 08/19/17   [provider]   nitroGLYCERIN (NITROSTAT) 0.4 MG SL tablet Place 1 tablet (0.4 mg total) under the tongue every 5 (five) minutes as needed for chest pain (3 doses MAX). 04/04/18   Belva Crome, MD  pentoxifylline (TRENTAL) 400 MG CR tablet Take 1 tablet (400 mg total) by mouth 2 (two) times daily. (Take at 10 am and 5 pm) 06/22/18   [name], [name], MD  polyethylene glycol [facility] Floria Raveling) packet Take 17 g by mouth daily as needed for mild constipation or moderate constipation. 05/21/15   Rai, Vernelle Emerald, MD  predniSONE (DELTASONE) 10 MG tablet Take 1 tablet (10 mg total) by mouth daily with breakfast. 01/24/17   Nafziger, Tommi Rumps, NP  timolol (TIMOPTIC) 0.5 % ophthalmic solution Place 1 drop into both eyes every morning. 04/27/15   [provider]  TRAVATAN Z 0.004 % SOLN ophthalmic solution Place 1 drop into both eyes every evening. 04/27/15   [provider]    Physical Exam: Vitals:   08/11/18 1030 08/11/18 1100 08/11/18 1252 08/11/18 1615  BP: (!) 174/81 (!) 166/80 (!) 157/100 (!) 162/100  Pulse: 79 78 84 89  Resp: 19 19 20 19   Temp:   97.6 F (36.4 C) 98 F (36.7 C)  TempSrc:   Oral Oral  SpO2: 96% 95% 94% 94%  Weight:   75.3 kg   Height:   5\' 10"  (1.778 m)      . General:  Appears calm and comfortable and is NAD . Eyes:   EOMI, normal lids, iris . ENT:  grossly normal hearing, lips & tongue, mmm . Neck:  no LAD, masses or thyromegaly . Cardiovascular:  RRR, no m/r/g. No LE edema.  Marland Kitchen Respiratory:   CTA bilaterally with no wheezes/rales/rhonchi.  Normal respiratory effort. . Abdomen:  soft, NT, ND, NABS . Back:  normal alignment, no CVAT . Skin:  no rash or induration seen on limited exam . Musculoskeletal:  grossly normal tone BUE/BLE, good ROM, no bony abnormality . Psychiatric:  grossly normal mood and affect, speech fluent and appropriate, AOx3 . Neurologic:  CN 2-12 grossly intact, moves all extremities in coordinated fashion, sensation intact     Radiological Exams on Admission: Dg Chest 2 View  Result Date: 08/11/2018 CLINICAL DATA:  Shortness of breath beginning yesterday. Previous myocardial infarction and congestive heart failure EXAM: CHEST - 2 VIEW COMPARISON:  05/25/2015 FINDINGS: Stable mild cardiomegaly. Prior CABG. New mild to moderate symmetric bilateral pulmonary airspace disease is seen, suspicious for pulmonary edema. Tiny bilateral pleural effusions also noted. IMPRESSION: New symmetric bilateral pulmonary airspace disease, likely due to pulmonary edema/congestive heart failure. Tiny bilateral pleural effusions. Electronically Signed   By: Earle Gell M.D.   On: 08/11/2018 08:51    EKG: Independently reviewed.  NSR with rate 80; LBBB; nonspecific ST changes with no evidence of acute ischemia   Labs on Admission: I have personally reviewed the available labs and imaging studies at the time of the admission.  Pertinent labs:   CO2 21 Glucose 189 BUN 61/Creatinine 3.92/GFR 15; 35/3.30/23 on 08/02/17 BNP 969.0; 1089.1 in 3/17 Troponin 0.05, chronically elevated and improved from prior WBC 10.5 Hgb 11.5   Assessment/Plan Principal Problem:   Acute exacerbation of CHF (congestive heart failure) (HCC) Active Problems:   Dyslipidemia   Essential hypertension   CKD (chronic kidney disease), stage IV (HCC)   Chronic combined systolic and diastolic heart failure (HCC)   CHF exacerbation -Patient with prior h/o CHF presenting with SOB and hypoxia -CXR consistent with pulmonary edema -Normal WBC count, no infectious symptoms -Elevated BNP -He was mildly hypoxic and started on O2 -Overall appears to be a mild exacerbation -Will place in observation status with telemetry -Will request repeat echocardiogram; prior in 3/18 showed EF 30-35% and grade 2 diastolic dysfunction -Will continue ASA -No ACE due to CKD -Continue Coreg -CHF order set utilized; may need CHF team consult but will hold until Echo results are  available -Was given Lasix 40 mg x 1 in ER and will repeat with 40 mg IV BID -Continue [place] O2 for now -Repeat EKG in AM -Chronic but stable troponin elevation, doubt ACS based on symptoms  HTN -Continue home medications - Norvasc, Coreg, Catapres, and hydralazine  HLD -Continue Lipitor -Lipids were checked in 6/19 with LDL 45 at that time  DM -Last A1c was 6.5 in 12/19, will repeat -Continue Lantus -Will cover with sensitive-scale SSI for now  Stage IV CKD -Baseline creatinine is 3.3-3.5 -Current creatinine is 3.92, likely slightly worse than baseline due to hypoperfusion in the setting of CHF exacerbation -Will monitor with diuresis to ensuring this is not worsening, as he is at risk for cardiorenal syndrome   Note:  We briefly discussed code status and this should be discussed more in-depth with PCP and patient's family.   Note: This patient has been tested and is negative for the novel coronavirus COVID-19 (both with Abbott platform and Cepheid)   DVT prophylaxis: Lovenox  Code Status:  Full - confirmed with patient Family Communication: None present; I spoke with his son, Park Liter, by telephone Disposition Plan:  Home once clinically improved Consults called: CM/PT Admission status: It is my clinical opinion that referral for OBSERVATION is reasonable and necessary in this patient based on the above information provided. The aforementioned taken together are felt to place the  patient at high risk for further clinical deterioration. However it is anticipated that the patient may be medically stable for discharge from the hospital within 24 to 48 hours.    Karmen Bongo MD [facility]   How to contact the [facility] Attending or Consulting provider [place] or covering provider during after hours [place], for this patient?  1. Check the care team in [facility] and look for a) attending/consulting TR[facility] provider listed and b) the [facility] team listed 2. Log into [email or web address] and use  [place]'s universal password to access. If you do not have the password, please contact the hospital operator. 3. Locate the [facility] provider you are looking for under [facility] and page to a number that you can be directly reached. 4. If you still have difficulty reaching the provider, please page the [facility] (Director on Call) for the Hospitalists listed on amion for assistance.   08/11/2018, 6:33 PM

## 2018-08-11 NOTE — Progress Notes (Signed)
Pt arrived on the unit. AOx4. RN advised the pt to send valuables home but pt refused. Pt insists to keep them with him. Will continue to monitor.

## 2018-08-11 NOTE — ED Notes (Signed)
Patient transported to X-ray 

## 2018-08-11 NOTE — ED Notes (Addendum)
Home meds , car kesy and gold colored rope bracelet, to be given to niece, Helene Kelp

## 2018-08-11 NOTE — ED Notes (Signed)
ED Provider at bedside. 

## 2018-08-11 NOTE — Plan of Care (Signed)
Discussed with Dr. Malvin Johns, MD [facility]. [name] is a 85 year old male with pmh HTN, HLD, CAD, DM, PVD, CKD stage IV, PUD, GERD, and AVMs; who presented with complaints of shortness of breath.  BUN 61, creatinine 3.92, BNP 969, and troponin 0.05. Patient noted to be hypoxic down to 86% on room air currently on 4 L to maintain O2 saturations greater than 92%.  CXR new bilateral opacities concerning for pulmonary edema with small bilateral effusions.  Patient was given 80 mg of Lasix IV. Accepted as observation to a cardiac telemetry bed.

## 2018-08-11 NOTE — ED Notes (Signed)
Date and time results received: 08/11/18 0852    Test: trp Critical Value: 0.05 Name of Provider Notified: Belf[place] Orders Received? Or Actions Taken?:no orders given

## 2018-08-11 NOTE — ED Provider Notes (Addendum)
[facility] Provider Note   CSN: [id number] Arrival date & time: 08/11/18  3710    History   Chief Complaint Chief Complaint  Patient presents with   Shortness of Breath    HPI [name] is a 85 y.o. male.     Patient is a 85 year old male with a history of DM, coronary artery disease, hypertension, CHF with a last known EF of 40 to 45% in 2016, chronic kidney disease, chronic anemia who presents with shortness of breath.  He reports that he started feeling short of breath yesterday and it progressed during the night.  He had a hard time sleeping and lying flat due to the shortness of breath.  He denies any cough or chest congestion.  No known fevers.  No associated chest pain or tightness.  No leg swelling.  No known sick contacts.  No history of similar symptoms in the past.  He is not on home oxygen or nebulizer/inhalers.     Past Medical History:  Diagnosis Date   Arthritis    "touch in my fingers" (09/10/2014)   Atrophic gastritis    AVM (arteriovenous malformation) of duodenum, acquired    B12 deficiency anemia    "stopped taking the shots in ~ 06/2014" (09/10/2014)   Barrett's esophagus    CAD (coronary artery disease)    a. H/o MI in 1998;  b. s/p CABG 2003. c. 10/2014 NSTEMI/Cath: LM 75, LAD 167m/d, D1 95, D2 50, LCX 100ost/p, OM1 80, OM2 80, RCA 100p/m, RPDA fills via L->L collats, LIMA->LAD ok, VG->dRCA 100, VG->OM1->OM2 99 prox to OM1 insertion->recanalated ->Tx with heparin,  VG->D1 100-->Med Rx.   Cataract    Chronic systolic CHF (congestive heart failure) ([place])    a. EF 40-45% in 10/2014 (previously normal in 08/2014).   CKD (chronic kidney disease), stage IV (HCC)    Diabetes 1.5, managed as type 1 ([place])    Hiatal hernia 2018   endoscopy -Dr. Havery Moros   History of stomach ulcers 2013   Hyperlipidemia    Hypertension    Hypertensive heart disease    Ischemic cardiomyopathy    a. 10/2014 Echo: EF  40-45%.   Myocardial infarction [facility])    PVD (peripheral vascular disease) ([place])    Type II diabetes mellitus ([place])     Patient Active Problem List   Diagnosis Date Noted   Gout due to renal impairment involving toe 02/08/2017   Chronic combined systolic and diastolic heart failure ([place])    Acute blood loss anemia 05/10/2015   CKD (chronic kidney disease), stage IV ([place])    B12 deficiency 06/27/2011   Peripheral vascular disease ([place]) 01/19/2007   Peptic ulcer disease- 2013 01/19/2007   Type 2 diabetes mellitus with stage 4 chronic kidney disease ([place]) 10/02/2006   Dyslipidemia 10/02/2006   Essential hypertension 10/02/2006   Coronary artery disease involving 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 ([place]) 10/02/2006   BENIGN PROSTATIC HYPERTROPHY 10/02/2006    Past Surgical History:  Procedure Laterality Date   CARDIAC CATHETERIZATION  2003;    CARDIAC CATHETERIZATION N/A 11/07/2014   Left Heart Cath; Belva Crome, MD;    CATARACT EXTRACTION, BILATERAL Bilateral    CHOLECYSTECTOMY N/A 05/20/2015   Procedure: LAPAROSCOPIC CHOLECYSTECTOMY;  Surgeon: Coralie Keens, MD;  Location: [facility];  Service: General;  Laterality: N/A;   COLONOSCOPY     COLONOSCOPY N/A 05/12/2015   Procedure: COLONOSCOPY;  Surgeon: Milus Banister, MD;  Location: [facility];  Service: Endoscopy;  Laterality: N/A;   CORONARY ANGIOPLASTY  1998   /notes 07/13/2010   CORONARY ARTERY BYPASS GRAFT  2003   CABG X5   ESOPHAGOGASTRODUODENOSCOPY  06/10/2011   Procedure: ESOPHAGOGASTRODUODENOSCOPY (EGD);  Surgeon: Ladene Artist, MD,FACG;  Location[facility] ENDOSCOPY;  Service: Endoscopy;  Laterality: N/A;   ESOPHAGOGASTRODUODENOSCOPY N/A 10/04/2016   Procedure: ESOPHAGOGASTRODUODENOSCOPY (EGD);  Surgeon: Manus Gunning, MD;  Location: [facility];  Service: Gastroenterology;  Laterality: N/A;        Home Medications    Prior to Admission medications   Medication  Sig Start Date End Date Taking? Authorizing Provider  ACCU-CHEK SOFTCLIX LANCETS lancets CHECK BLOOD GLUCOSE THREE TIMES DAILY 04/18/17   Marletta Lor, MD  acetaminophen (TYLENOL) 500 MG tablet Take 500 mg by mouth every 8 (eight) hours as needed for mild pain or headache.    [provider]  Alcohol Swabs (ALCOHOL PREP) PADS USE TO TEST BLOOD GLUCOSE THREE TIMES DAILY 03/09/17   Marletta Lor, MD  amLODipine (NORVASC) 10 MG tablet TAKE 1 TABLET EVERY DAY 01/01/18   Dorena Cookey, MD  aspirin EC 81 MG tablet Take 81 mg by mouth at bedtime.    [provider]  atorvastatin (LIPITOR) 80 MG tablet Take 1 tablet (80 mg total) by mouth daily at 6 PM. Pt needs to call and make appt before anymore refills 07/30/18   Belva Crome, MD  BD PEN NEEDLE NANO U/F 32G X 4 MM MISC INJECT TWO TIMES DAILY AS NEEDED. 08/23/17   Marletta Lor, MD  bisacodyl (DULCOLAX) 5 MG EC tablet Take 5 mg by mouth daily as needed for moderate constipation.    [provider]  carvedilol (COREG) 25 MG tablet TAKE 1 TABLET TWICE DAILY WITH MEALS 10/23/17   Marletta Lor, MD  cloNIDine (CATAPRES) 0.1 MG tablet Take 1 tablet (0.1 mg total) by mouth at bedtime. 07/03/18   [name], [name], MD  Ferrous Sulfate (IRON) 325 (65 Fe) MG TABS Take 1 tablet by mouth daily. 07/29/16   Marletta Lor, MD  furosemide (LASIX) 40 MG tablet TAKE 1 AND 1/2 TABLETS EVERY MORNING  AND TAKE 1 TABLET EVERY EVENING 07/13/18   Belva Crome, MD  glucose blood (ACCU-CHEK AVIVA PLUS) test strip USE TO TEST BLOOD GLUCOSE THREE TIMES DAILY 07/03/18   [name], [name], MD  hydrALAZINE (APRESOLINE) 25 MG tablet TAKE 1 TABLET (25 MG TOTAL) 3 TIMES DAILY. 11/07/17   Belva Crome, MD  insulin aspart (NOVOLOG FLEXPEN) 100 UNIT/ML FlexPen Inject 14 Units into the skin 3 (three) times daily with meals.    [provider]  Insulin Glargine (LANTUS SOLOSTAR) 100 UNIT/ML Solostar Pen Inject  12 Units into the skin daily at 10 pm. 11/02/17   Marletta Lor, MD  isosorbide mononitrate (IMDUR) 60 MG 24 hr tablet TAKE 2 TABLETS EVERY DAY 07/13/18   Belva Crome, MD  Lancet Devices [facility]) lancets USE TO TEST BLOOD GLUCOSE THREE TIMES DAILY 03/09/17   Marletta Lor, MD  latanoprost (XALATAN) 0.005 % ophthalmic solution Place 1 drop into both eyes at bedtime. 08/19/17   [provider]  nitroGLYCERIN (NITROSTAT) 0.4 MG SL tablet Place 1 tablet (0.4 mg total) under the tongue every 5 (five) minutes as needed for chest pain (3 doses MAX). 04/04/18   Belva Crome, MD  pentoxifylline (TRENTAL) 400 MG CR tablet Take 1 tablet (400 mg total) by mouth 2 (  two) times daily. (Take at 10 am and 5 pm) 06/22/18   [name], [name], MD  polyethylene glycol [facility]aveling) packet Take 17 g by mouth daily as needed for mild constipation or moderate constipation. 05/21/15   Rai, Vernelle Emerald, MD  predniSONE (DELTASONE) 10 MG tablet Take 1 tablet (10 mg total) by mouth daily with breakfast. 01/24/17   Nafziger, Tommi Rumps, NP  timolol (TIMOPTIC) 0.5 % ophthalmic solution Place 1 drop into both eyes every morning. 04/27/15   [provider]  TRAVATAN Z 0.004 % SOLN ophthalmic solution Place 1 drop into both eyes every evening. 04/27/15   [provider]    Family History Family History  Problem Relation Age of Onset   Stroke Mother    Hypertension Mother    Cancer Mother    Diabetes Other        brothers and sisters   Coronary artery disease Other    Hypertension Sister    Heart attack Sister    Colon cancer Neg Hx     Social History Social History   Tobacco Use   Smoking status: Former Smoker    Packs/day: 0.50    Years: 20.00    Pack years: 10.00    Types: Cigarettes   Smokeless tobacco: Never Used   Tobacco comment: "quit smoking cigarettes in the 1960s   Substance Use Topics   Alcohol use: No   Drug use: No      Allergies   Patient has no known allergies.   Review of Systems Review of Systems  Constitutional: Negative for chills, diaphoresis, fatigue and fever.  HENT: Negative for congestion, rhinorrhea and sneezing.   Eyes: Negative.   Respiratory: Positive for shortness of breath. Negative for cough and chest tightness.   Cardiovascular: Negative for chest pain and leg swelling.  Gastrointestinal: Negative for abdominal pain, blood in stool, diarrhea, nausea and vomiting.  Genitourinary: Negative for difficulty urinating, flank pain, frequency and hematuria.  Musculoskeletal: Negative for arthralgias and back pain.  Skin: Negative for rash.  Neurological: Negative for dizziness, speech difficulty, weakness, numbness and headaches.     Physical Exam Updated Vital Signs BP (!) 163/89    Pulse 83    Temp 98.4 F (36.9 C) (Oral)    Resp 19    Ht 5\' 10"  (1.778 m)    Wt 72.6 kg    SpO2 97%    BMI 22.96 kg/m   Physical Exam Constitutional:      Appearance: He is well-developed.  HENT:     Head: Normocephalic and atraumatic.  Eyes:     Pupils: Pupils are equal, round, and reactive to light.  Neck:     Musculoskeletal: Normal range of motion and neck supple.  Cardiovascular:     Rate and Rhythm: Normal rate and regular rhythm.     Heart sounds: Normal heart sounds.  Pulmonary:     Effort: Pulmonary effort is normal. No respiratory distress.     Breath sounds: Rales present. No wheezing.  Chest:     Chest wall: No tenderness.  Abdominal:     General: Bowel sounds are normal.     Palpations: Abdomen is soft.     Tenderness: There is no abdominal tenderness. There is no guarding or rebound.  Musculoskeletal: Normal range of motion.     Right lower leg: No edema.     Left lower leg: No edema.     Comments: No edema or calf tenderness  Lymphadenopathy:  Cervical: No cervical adenopathy.  Skin:    General: Skin is warm and dry.     Findings: No rash.  Neurological:      Mental Status: He is alert and oriented to person, place, and time.      ED Treatments / Results  Labs (all labs ordered are listed, but only abnormal results are displayed) Labs Reviewed  COMPREHENSIVE METABOLIC PANEL - Abnormal; Notable for the following components:      Result Value   CO2 21 (*)    Glucose, Bld 189 (*)    BUN 61 (*)    Creatinine, Ser 3.92 (*)    GFR calc non Af Amer 13 (*)    GFR calc Af Amer 15 (*)    All other components within normal limits  CBC WITH DIFFERENTIAL/PLATELET - Abnormal; Notable for the following components:   RBC 3.71 (*)    Hemoglobin 11.5 (*)    HCT 35.9 (*)    Neutro Abs 8.7 (*)    Abs Immature Granulocytes 0.11 (*)    All other components within normal limits  TROPONIN I - Abnormal; Notable for the following components:   Troponin I 0.05 (*)    All other components within normal limits  BRAIN NATRIURETIC PEPTIDE - Abnormal; Notable for the following components:   B Natriuretic Peptide 969.0 (*)    All other components within normal limits  SARS CORONAVIRUS 2 (HOSP ORDER, PERFORMED IN [place] [facility] VIA [facility] ID)    EKG EKG Interpretation  Date/Time:  Saturday August 11 2018 07:49:56 EDT Ventricular Rate:  80 PR Interval:    QRS Duration: 125 QT Interval:  392 QTC Calculation: 453 R Axis:   -31 Text Interpretation:  Sinus rhythm Prolonged PR interval Left bundle branch block similar to prior EKG Confirmed by Malvin Johns [phone]) on 08/11/2018 7:56:42 AM   Radiology Dg Chest 2 View  Result Date: 08/11/2018 CLINICAL DATA:  Shortness of breath beginning yesterday. Previous myocardial infarction and congestive heart failure EXAM: CHEST - 2 VIEW COMPARISON:  05/25/2015 FINDINGS: Stable mild cardiomegaly. Prior CABG. New mild to moderate symmetric bilateral pulmonary airspace disease is seen, suspicious for pulmonary edema. Tiny bilateral pleural effusions also noted. IMPRESSION: New symmetric bilateral pulmonary airspace disease,  likely due to pulmonary edema/congestive heart failure. Tiny bilateral pleural effusions. Electronically Signed   By: Earle Gell M.D.   On: 08/11/2018 08:51    Procedures Procedures (including critical care time)  Medications Ordered in ED Medications  furosemide (LASIX) injection 80 mg (80 mg Intravenous Given 08/11/18 0854)     Initial Impression / Assessment and Plan / ED Course  I have reviewed the triage vital signs and the nursing notes.  Pertinent labs & imaging results that were available during my care of the patient were reviewed by me and considered in my medical decision making (see chart for details).       Patient is a 59-year-old male who presents with shortness of breath.  He has evidence of fluid overload/CHF exacerbation.  His chest x-ray shows pulmonary edema.  He has no fever or suggestions of infection.  His COVID test is negative.  His troponin is mildly elevated although he denies any chest pain or tightness.  This is likely from demand ischemia.  He has an elevated creatinine but it similar to prior values.  His other labs are non-concerning.  He has no ischemic changes noted on EKG.  He is requiring oxygen.  His oxygen saturation was 86%  on arrival.  He was placed on a nasal cannula and this had to be titrated up to 4 L.  He does not have any increased work of breathing however.  I do not feel at this point he needs BiPAP.  He was given a dose of IV Lasix.  I spoke with Dr. Tamala Julian at [facility] who is accepted the patient for transfer/admission.  Per pt's request, I did update his son, [name].  BRIGHAM COBBINS was evaluated in Emergency Department on 08/11/2018 for the symptoms described in the history of present illness. He was evaluated in the context of the global COVID-19 pandemic, which necessitated consideration that the patient might be at risk for infection with the SARS-CoV-2 virus that causes COVID-19. Institutional protocols and algorithms that pertain to the  evaluation of patients at risk for COVID-19 are in a state of rapid change based on information released by regulatory bodies including the CDC and federal and state organizations. These policies and algorithms were followed during the patient's care in the ED.   CRITICAL CARE Performed by: Malvin Johns Total critical care time: 45 minutes Critical care time was exclusive of separately billable procedures and treating other patients. Critical care was necessary to treat or prevent imminent or life-threatening deterioration. Critical care was time spent personally by me on the following activities: development of treatment plan with patient and/or surrogate as well as nursing, discussions with consultants, evaluation of patient's response to treatment, examination of patient, obtaining history from patient or surrogate, ordering and performing treatments and interventions, ordering and review of laboratory studies, ordering and review of radiographic studies, pulse oximetry and re-evaluation of patient's condition.    Final Clinical Impressions(s) / ED Diagnoses   Final diagnoses:  Acute on chronic congestive heart failure, unspecified heart failure type [facility])    ED Discharge Orders    None       Malvin Johns, MD 08/11/18 1610    Malvin Johns, MD 08/11/18 [phone]

## 2018-08-11 NOTE — ED Notes (Signed)
Report given to Michael RN with [facility] 

## 2018-08-11 NOTE — ED Triage Notes (Signed)
SOB since yesterday. Denies chest pain, cough.

## 2018-08-11 NOTE — ED Notes (Signed)
O2 sat 87 and 88% on 4 liters [place]. ED MD and RT at bedside

## 2018-08-11 NOTE — ED Notes (Signed)
Spoke with Dr. Tamera Punt regarding possible portable CXR due to precautions assigned to patient.  Dr. Tamera Punt still requests 2 view CXR as she suspects this is CHF related, not Covid so patient is able to come to radiology for exam.

## 2018-08-12 DIAGNOSIS — I252 Old myocardial infarction: Secondary | ICD-10-CM | POA: Diagnosis not present

## 2018-08-12 DIAGNOSIS — I2510 Atherosclerotic heart disease of native coronary artery without angina pectoris: Secondary | ICD-10-CM | POA: Diagnosis present

## 2018-08-12 DIAGNOSIS — Z9861 Coronary angioplasty status: Secondary | ICD-10-CM | POA: Diagnosis not present

## 2018-08-12 DIAGNOSIS — Z8711 Personal history of peptic ulcer disease: Secondary | ICD-10-CM | POA: Diagnosis not present

## 2018-08-12 DIAGNOSIS — E876 Hypokalemia: Secondary | ICD-10-CM | POA: Diagnosis present

## 2018-08-12 DIAGNOSIS — I255 Ischemic cardiomyopathy: Secondary | ICD-10-CM | POA: Diagnosis present

## 2018-08-12 DIAGNOSIS — E1151 Type 2 diabetes mellitus with diabetic peripheral angiopathy without gangrene: Secondary | ICD-10-CM | POA: Diagnosis present

## 2018-08-12 DIAGNOSIS — E119 Type 2 diabetes mellitus without complications: Secondary | ICD-10-CM | POA: Diagnosis not present

## 2018-08-12 DIAGNOSIS — Z9841 Cataract extraction status, right eye: Secondary | ICD-10-CM | POA: Diagnosis not present

## 2018-08-12 DIAGNOSIS — I5023 Acute on chronic systolic (congestive) heart failure: Secondary | ICD-10-CM | POA: Diagnosis present

## 2018-08-12 DIAGNOSIS — Z794 Long term (current) use of insulin: Secondary | ICD-10-CM | POA: Diagnosis not present

## 2018-08-12 DIAGNOSIS — E785 Hyperlipidemia, unspecified: Secondary | ICD-10-CM | POA: Diagnosis present

## 2018-08-12 DIAGNOSIS — Z9842 Cataract extraction status, left eye: Secondary | ICD-10-CM | POA: Diagnosis not present

## 2018-08-12 DIAGNOSIS — Z951 Presence of aortocoronary bypass graft: Secondary | ICD-10-CM | POA: Diagnosis not present

## 2018-08-12 DIAGNOSIS — I5042 Chronic combined systolic (congestive) and diastolic (congestive) heart failure: Secondary | ICD-10-CM | POA: Diagnosis not present

## 2018-08-12 DIAGNOSIS — Z7982 Long term (current) use of aspirin: Secondary | ICD-10-CM | POA: Diagnosis not present

## 2018-08-12 DIAGNOSIS — E1122 Type 2 diabetes mellitus with diabetic chronic kidney disease: Secondary | ICD-10-CM | POA: Diagnosis present

## 2018-08-12 DIAGNOSIS — Z20828 Contact with and (suspected) exposure to other viral communicable diseases: Secondary | ICD-10-CM | POA: Diagnosis present

## 2018-08-12 DIAGNOSIS — I130 Hypertensive heart and chronic kidney disease with heart failure and stage 1 through stage 4 chronic kidney disease, or unspecified chronic kidney disease: Secondary | ICD-10-CM | POA: Diagnosis present

## 2018-08-12 DIAGNOSIS — I5043 Acute on chronic combined systolic (congestive) and diastolic (congestive) heart failure: Secondary | ICD-10-CM | POA: Diagnosis not present

## 2018-08-12 DIAGNOSIS — J9621 Acute and chronic respiratory failure with hypoxia: Secondary | ICD-10-CM | POA: Diagnosis not present

## 2018-08-12 DIAGNOSIS — I10 Essential (primary) hypertension: Secondary | ICD-10-CM | POA: Diagnosis not present

## 2018-08-12 DIAGNOSIS — M109 Gout, unspecified: Secondary | ICD-10-CM | POA: Diagnosis present

## 2018-08-12 DIAGNOSIS — I509 Heart failure, unspecified: Secondary | ICD-10-CM | POA: Diagnosis present

## 2018-08-12 DIAGNOSIS — Z79899 Other long term (current) drug therapy: Secondary | ICD-10-CM | POA: Diagnosis not present

## 2018-08-12 DIAGNOSIS — N184 Chronic kidney disease, stage 4 (severe): Secondary | ICD-10-CM | POA: Diagnosis present

## 2018-08-12 DIAGNOSIS — Z9049 Acquired absence of other specified parts of digestive tract: Secondary | ICD-10-CM | POA: Diagnosis not present

## 2018-08-12 LAB — CBC WITH DIFFERENTIAL/PLATELET
Abs Immature Granulocytes: 0.07 10*3/uL (ref 0.00–0.07)
Basophils Absolute: 0 10*3/uL (ref 0.0–0.1)
Basophils Relative: 0 %
Eosinophils Absolute: 0.1 10*3/uL (ref 0.0–0.5)
Eosinophils Relative: 1 %
HCT: 35.6 % — ABNORMAL LOW (ref 39.0–52.0)
Hemoglobin: 11.8 g/dL — ABNORMAL LOW (ref 13.0–17.0)
Immature Granulocytes: 1 %
Lymphocytes Relative: 7 %
Lymphs Abs: 0.7 10*3/uL (ref 0.7–4.0)
MCH: 31.1 pg (ref 26.0–34.0)
MCHC: 33.1 g/dL (ref 30.0–36.0)
MCV: 93.7 fL (ref 80.0–100.0)
Monocytes Absolute: 0.6 10*3/uL (ref 0.1–1.0)
Monocytes Relative: 6 %
Neutro Abs: 9 10*3/uL — ABNORMAL HIGH (ref 1.7–7.7)
Neutrophils Relative %: 85 %
Platelets: 188 10*3/uL (ref 150–400)
RBC: 3.8 MIL/uL — ABNORMAL LOW (ref 4.22–5.81)
RDW: 14.6 % (ref 11.5–15.5)
WBC: 10.5 10*3/uL (ref 4.0–10.5)
nRBC: 0 % (ref 0.0–0.2)

## 2018-08-12 LAB — BASIC METABOLIC PANEL
Anion gap: 14 (ref 5–15)
BUN: 61 mg/dL — ABNORMAL HIGH (ref 8–23)
CO2: 19 mmol/L — ABNORMAL LOW (ref 22–32)
Calcium: 9.5 mg/dL (ref 8.9–10.3)
Chloride: 105 mmol/L (ref 98–111)
Creatinine, Ser: 3.97 mg/dL — ABNORMAL HIGH (ref 0.61–1.24)
GFR calc Af Amer: 15 mL/min — ABNORMAL LOW (ref >60)
GFR calc non Af Amer: 13 mL/min — ABNORMAL LOW (ref >60)
Glucose, Bld: 150 mg/dL — ABNORMAL HIGH (ref 70–99)
Potassium: 3.2 mmol/L — ABNORMAL LOW (ref 3.5–5.1)
Sodium: 138 mmol/L (ref 135–145)

## 2018-08-12 LAB — GLUCOSE, CAPILLARY
Glucose-Capillary: 143 mg/dL — ABNORMAL HIGH (ref 70–99)
Glucose-Capillary: 163 mg/dL — ABNORMAL HIGH (ref 70–99)
Glucose-Capillary: 161 mg/dL — ABNORMAL HIGH (ref 70–99)
Glucose-Capillary: 144 mg/dL — ABNORMAL HIGH (ref 70–99)

## 2018-08-12 MED ORDER — MELATONIN 3 MG PO TABS
3.0000 mg | ORAL_TABLET | Freq: Every day | ORAL | Status: DC
  Administered 2018-08-12: 3 mg via ORAL
  Filled 2018-08-12 (×2): qty 1

## 2018-08-12 MED ORDER — SENNOSIDES-DOCUSATE SODIUM 8.6-50 MG PO TABS
1.0000 | ORAL_TABLET | Freq: Two times a day (BID) | ORAL | Status: DC
  Administered 2018-08-12 – 2018-08-13 (×2): 1 via ORAL
  Filled 2018-08-12 (×2): qty 1

## 2018-08-12 MED ORDER — SODIUM BICARBONATE 650 MG PO TABS
650.0000 mg | ORAL_TABLET | Freq: Every day | ORAL | Status: DC
  Administered 2018-08-12 – 2018-08-13 (×2): 650 mg via ORAL
  Filled 2018-08-12 (×2): qty 1

## 2018-08-12 MED ORDER — POTASSIUM CHLORIDE CRYS ER 20 MEQ PO TBCR
40.0000 meq | EXTENDED_RELEASE_TABLET | Freq: Once | ORAL | Status: AC
  Administered 2018-08-12: 40 meq via ORAL
  Filled 2018-08-12: qty 2

## 2018-08-12 NOTE — Progress Notes (Addendum)
PROGRESS NOTE  [name]:[id number] DOB: Jun 21, 1931 DOA: 08/11/2018 PCP: [name], [name], MD  HPI/Recap of past 24 hours:  Remain edematous, still has dyspnea on exertion but not off o2 at rest He denies chest pain, no cough, no fever  Assessment/Plan: Principal Problem:   Acute exacerbation of CHF (congestive heart failure) (HCC) Active Problems:   Dyslipidemia   Essential hypertension   CKD (chronic kidney disease), stage IV (HCC)   Chronic combined systolic and diastolic heart failure (HCC)  Acute on chronic systolic chf with acute hypoxic respiratory failure, with pulmonary edema /bilateral pleural effusions on presentation -Patient noted to be hypoxic down to 86% on room air , elevated bnp, and troponin  -repeat echo pending -urine output 1.5liter since admission, continue iv lasix, wean oxygen  -continue coreg, hydralazine/imdur -cardiology consulted, dietitian consulted for heart failure diet, patient does not appear to have good insight to his heart failure, he lives byhimself, he does not check his weight.  H/o CAD s/p CABG  -bypass graft failure with only remaining conduit LIMA to LAD by cath in September 2016 -denies chest pain -repeat echo pending  Hypokalemia: Replace k  CKDIV/V, metabolic acidosis  -per pcp note "patient states he would never want to go on dialysis" -follows Dr Joelyn Oms,  -monitor renal function while on IV lasix -start sodium bicarb supplement   HTN;  On coreg, hydralazine/imdur/norvasc/clonidine/lasix at home which are continued   Insulin dependent DM2, controlled, he reports does not feel good when his blood glucose drop to the 90's -a1c 7.5 -continue current dose insulin   history of stomach ulcers with GI bleeding while on Plavix. H/o duodenum AVM No active bleed   H/o PVD on pentoxifyline   COVID screening negative  Code Status: full  Family Communication: patient   Disposition Plan: home with  cardiology clearance, hopefully on Monday    Consultants:  Cardiology consult requested from epic  Procedures:  none  Antibiotics:  none   Objective: BP (!) 141/67 (BP Location: Right Arm)   Pulse 78   Temp 99.4 F (37.4 C) (Oral)   Resp 18   Ht 5\' 10"  (1.778 m)   Wt 74.5 kg   SpO2 96%   BMI 23.57 kg/m   Intake/Output Summary (Last 24 hours) at 08/12/2018 1323 Last data filed at 08/12/2018 1252 Gross per 24 hour  Intake 920 ml  Output 1500 ml  Net -580 ml   Filed Weights   08/11/18 0742 08/11/18 1252 08/12/18 0500  Weight: 72.6 kg 75.3 kg 74.5 kg    Exam: Patient is examined daily including today on 08/12/2018, exams remain the same as of yesterday except that has changed    General:  NAD  Cardiovascular: RRR  Respiratory: CTABL  Abdomen: Soft/ND/NT, positive BS  Musculoskeletal: trace bilateral pedal and ankle Edema  Neuro: alert, oriented   Data Reviewed: Basic Metabolic Panel: Recent Labs  Lab 08/11/18 0800 08/12/18 0633  NA 137 138  K 3.8 3.2*  CL 105 105  CO2 21* 19*  GLUCOSE 189* 150*  BUN 61* 61*  CREATININE 3.92* 3.97*  CALCIUM 9.1 9.5   Liver Function Tests: Recent Labs  Lab 08/11/18 0800  AST 19  ALT 20  ALKPHOS 74  BILITOT 1.0  PROT 8.0  ALBUMIN 4.4   No results for input(s): LIPASE, AMYLASE in the last 168 hours. No results for input(s): AMMONIA in the last 168 hours. CBC: Recent Labs  Lab 08/11/18 0800 08/12/18 0633  WBC  10.5 10.5  NEUTROABS 8.7* 9.0*  HGB 11.5* 11.8*  HCT 35.9* 35.6*  MCV 96.8 93.7  PLT 162 188   Cardiac Enzymes:   Recent Labs  Lab 08/11/18 0800  TROPONINI 0.05*   BNP (last 3 results) Recent Labs    08/11/18 0800  BNP 969.0*    ProBNP (last 3 results) No results for input(s): PROBNP in the last 8760 hours.  CBG: Recent Labs  Lab 08/11/18 1613 08/11/18 2050 08/12/18 0621 08/12/18 1251  GLUCAP 158* 192* 143* 163*    Recent Results (from the past 240 hour(s))  SARS  Coronavirus 2 (Hosp order,Performed in [facility] lab via [employer] ID)     Status: None   Collection Time: 08/11/18  8:05 AM   Specimen: Dry Nasal Swab ([employer] ID Now)  Result Value Ref Range Status   SARS Coronavirus 2 ([employer] ID Now) NEGATIVE NEGATIVE Final    Comment: (NOTE) Interpretive Result Comment(s): COVID 19 Positive SARS CoV 2 target nucleic acids are DETECTED. The SARS CoV 2 RNA is generally detectable in upper and lower respiratory specimens during the acute phase of infection.  Positive results are indicative of active infection with SARS CoV 2.  Clinical correlation with patient history and other diagnostic information is necessary to determine patient infection status.  Positive results do not rule out bacterial infection or coinfection with other viruses. The expected result is Negative. COVID 19 Negative SARS CoV 2 target nucleic acids are NOT DETECTED. The SARS CoV 2 RNA is generally detectable in upper and lower respiratory specimens during the acute phase of infection.  Negative results do not preclude SARS CoV 2 infection, do not rule out coinfections with other pathogens, and should not be used as the sole basis for treatment or other patient management decisions.  Negative results must be combined with clinical  observations, patient history, and epidemiological information. The expected result is Negative. Invalid Presence or absence of SARS CoV 2 nucleic acids cannot be determined. Repeat testing was performed on the submitted specimen and repeated Invalid results were obtained.  If clinically indicated, additional testing on a new specimen with an alternate test methodology [phone]) is advised.  The SARS CoV 2 RNA is generally detectable in upper and lower respiratory specimens during the acute phase of infection. The expected result is Negative. Fact Sheet for Patients:  GolfingFamily.no Fact Sheet for Healthcare Providers:  https://www.hernandez-brewer.com/ This test is not yet approved or cleared by the [place] FDA and has been authorized for detection and/or diagnosis of SARS CoV 2 by FDA under an Emergency Use Authorization (EUA).  This EUA will remain in effect (meaning this test can be used) for the duration of the COVID19 d eclaration under Section 564(b)(1) of the Act, 21 U.S.C. section 878 882 8182 3(b)(1), unless the authorization is terminated or revoked sooner. Performed at [facility], [address] [place], [place] [address]   SARS Coronavirus 2     Status: None   Collection Time: 08/11/18  1:21 PM  Result Value Ref Range Status   SARS Coronavirus 2 NOT DETECTED NOT DETECTED Final    Comment: (NOTE) SARS-CoV-2 target nucleic acids are NOT DETECTED. The SARS-CoV-2 RNA is generally detectable in upper and lower respiratory specimens during the acute phase of infection.  Negative  results do not preclude SARS-CoV-2 infection, do not rule out co-infections with other pathogens, and should not be used as the sole basis for treatment or other patient management decisions.  Negative results must  be combined with clinical observations, patient history, and epidemiological information. The expected result is Not Detected. Fact Sheet for Patients: http://www.biofiredefense.com/wp-content/uploads/2020/03/BIOFIRE-COVID -19-patients.pdf Fact Sheet for Healthcare Providers: http://www.biofiredefense.com/wp-content/uploads/2020/03/BIOFIRE-COVID -19-hcp.pdf This test is not yet approved or cleared by the [place] and  has been authorized for detection and/or diagnosis of SARS-CoV-2 by FDA under an Emergency Use Authorization (EUA).  This EUA will remain in effec t (meaning this test can be used) for the duration of  the COVID-19 declaration under Section 564(b)(1) of the Act, 21 U.S.C. section 360bbb-3(b)(1), unless the authorization is terminated or revoked sooner.  Performed at [facility], [place] [address], [place], [place] [address]      Studies: No results found.  Scheduled Meds: . amLODipine  10 mg Oral Daily  . aspirin EC  81 mg Oral QHS  . atorvastatin  80 mg Oral q1800  . carvedilol  25 mg Oral BID WC  . cloNIDine  0.1 mg Oral QHS  . enoxaparin (LOVENOX) injection  30 mg Subcutaneous Q24H  . furosemide  40 mg Intravenous Q12H  . hydrALAZINE  25 mg Oral Q8H  . insulin aspart  0-5 Units Subcutaneous QHS  . insulin aspart  0-9 Units Subcutaneous TID WC  . insulin glargine  12 Units Subcutaneous Q2200  . isosorbide mononitrate  120 mg Oral Daily  . latanoprost  1 drop Both Eyes QHS  . pentoxifylline  400 mg Oral BID  . potassium chloride  40 mEq Oral Once  . sodium chloride flush  3 mL Intravenous Q12H  . timolol  1 drop Both Eyes q morning - 10a    Continuous Infusions: . sodium chloride       Time spent: 56mins I have personally reviewed and interpreted on  08/12/2018 daily labs, tele strips, imagings as discussed above under date review session and assessment and plans.  I reviewed all nursing notes, pharmacy note,  vitals, pertinent old records  I have discussed plan of care as described above with RN , patient  on 08/12/2018   Florencia Reasons MD, PhD  [facility] Pager [phone]. If 7PM-7AM, please contact night-coverage at [email or web address], password [facility] 08/12/2018, 1:23 PM  LOS: 0 days

## 2018-08-12 NOTE — Care Management Obs Status (Signed)
[place] NOTIFICATION   Patient Details  Name: [name] MRN: [id number] Date of Birth: Jul 24, 1931   Medicare Observation Status Notification Given:  Yes    Bary Castilla, LCSW 08/12/2018, 1:03 PM

## 2018-08-12 NOTE — Evaluation (Signed)
Physical Therapy Evaluation/ Discharge Patient Details Name: [name] MRN: [id number] DOB: 20-Sep-1931 Today's Date: 08/12/2018   History of Present Illness  85 y.o. male admitted with SOB due to CHF exacerbation with PMHx: HTN, HLD, CAD, DM, PVD, CKD stage IV, PUD, GERD, and AVMs  Clinical Impression  Pt very pleasant in bed on arrival on 4L with sPO2 initially only reading 90% and bumped to 6L to initiate gait. However during walk able to turn O2 down to 3L with pt maintaining 93% during gait and end of session sating 95% on 2L. Pt moving well without physical assist or cues for mobility. Pt reports supplemental oxygen assisting activity tolerance as he became SOB with bathing at sink earlier. Pt educated for energy conservation and encouraged to continue to walk with nursing daily to assess supplemental O2 demand. Pt verbalized understanding and agreeable to no further acute P.T. needs. Will sign off.  HR 85 with gait    Follow Up Recommendations No PT follow up    Equipment Recommendations  None recommended by PT    Recommendations for Other Services       Precautions / Restrictions Precautions Precautions: None Restrictions Weight Bearing Restrictions: No      Mobility  Bed Mobility Overal bed mobility: Independent                Transfers Overall transfer level: Independent                  Ambulation/Gait Ambulation/Gait assistance: Independent Gait Distance (Feet): 400 Feet Assistive device: None Gait Pattern/deviations: WFL(Within Functional Limits)   Gait velocity interpretation: >4.37 ft/sec, indicative of normal walking speed General Gait Details: pt with long hall ambulation on 3L with SpO2 93% and HR 85. No significant LOB with good endurance and speed. Pt reporting increased activity tolerance from earlier today at sink on RA  Stairs            Wheelchair Mobility    Modified Rankin (Stroke Patients Only)       Balance  Overall balance assessment: No apparent balance deficits (not formally assessed)                                           Pertinent Vitals/Pain      Home Living Family/patient expects to be discharged to:: Private residence Living Arrangements: Alone Available Help at Discharge: Family;Available PRN/intermittently Type of Home: House Home Access: Level entry     Home Layout: One level Home Equipment: None      Prior Function Level of Independence: Independent         Comments: cooking, cleaning, driving     Hand Dominance        Extremity/Trunk Assessment   Upper Extremity Assessment Upper Extremity Assessment: Overall WFL for tasks assessed    Lower Extremity Assessment Lower Extremity Assessment: Overall WFL for tasks assessed    Cervical / Trunk Assessment Cervical / Trunk Assessment: Normal  Communication   Communication: No difficulties  Cognition Arousal/Alertness: Awake/alert Behavior During Therapy: WFL for tasks assessed/performed Overall Cognitive Status: Within Functional Limits for tasks assessed                                        General Comments      Exercises  Assessment/Plan    PT Assessment Patent does not need any further PT services  PT Problem List         PT Treatment Interventions      PT Goals (Current goals can be found in the Care Plan section)  Acute Rehab PT Goals PT Goal Formulation: All assessment and education complete, DC therapy    Frequency     Barriers to discharge        Co-evaluation               AM-PAC PT "6 Clicks" Mobility  Outcome Measure Help needed turning from your back to your side while in a flat bed without using bedrails?: None Help needed moving from lying on your back to sitting on the side of a flat bed without using bedrails?: None Help needed moving to and from a bed to a chair (including a wheelchair)?: None Help needed standing up  from a chair using your arms (e.g., wheelchair or bedside chair)?: None Help needed to walk in hospital room?: None Help needed climbing 3-5 steps with a railing? : None 6 Click Score: 24    End of Session Equipment Utilized During Treatment: Gait belt;Oxygen Activity Tolerance: Patient tolerated treatment well Patient left: in chair;with call bell/phone within reach Nurse Communication: Mobility status PT Visit Diagnosis: Other abnormalities of gait and mobility (R26.89)    Time: 4462-8638 PT Time Calculation (min) (ACUTE ONLY): 20 min   Charges:   PT Evaluation $PT Eval Moderate Complexity: 1 Mod          [place], PT Acute Rehabilitation Services Pager: [phone] Office: [phone]   [place] B Ozzie Remmers 08/12/2018, 1:18 PM

## 2018-08-13 ENCOUNTER — Ambulatory Visit: Payer: Medicare HMO

## 2018-08-13 LAB — BASIC METABOLIC PANEL
Anion gap: 12 (ref 5–15)
BUN: 64 mg/dL — ABNORMAL HIGH (ref 8–23)
CO2: 20 mmol/L — ABNORMAL LOW (ref 22–32)
Calcium: 9 mg/dL (ref 8.9–10.3)
Chloride: 105 mmol/L (ref 98–111)
Creatinine, Ser: 4.15 mg/dL — ABNORMAL HIGH (ref 0.61–1.24)
GFR calc Af Amer: 14 mL/min — ABNORMAL LOW (ref >60)
GFR calc non Af Amer: 12 mL/min — ABNORMAL LOW (ref >60)
Glucose, Bld: 129 mg/dL — ABNORMAL HIGH (ref 70–99)
Potassium: 3.3 mmol/L — ABNORMAL LOW (ref 3.5–5.1)
Sodium: 137 mmol/L (ref 135–145)

## 2018-08-13 LAB — GLUCOSE, CAPILLARY
Glucose-Capillary: 152 mg/dL — ABNORMAL HIGH (ref 70–99)
Glucose-Capillary: 157 mg/dL — ABNORMAL HIGH (ref 70–99)
Glucose-Capillary: 127 mg/dL — ABNORMAL HIGH (ref 70–99)

## 2018-08-13 MED ORDER — POTASSIUM CHLORIDE CRYS ER 20 MEQ PO TBCR: 20.0000 meq | EXTENDED_RELEASE_TABLET | Freq: Every day | ORAL | 0 refills | Status: DC

## 2018-08-13 MED ORDER — POTASSIUM CHLORIDE CRYS ER 20 MEQ PO TBCR
20.0000 meq | EXTENDED_RELEASE_TABLET | Freq: Every day | ORAL | Status: DC
  Administered 2018-08-13: 09:00:00 20 meq via ORAL
  Filled 2018-08-13: qty 1

## 2018-08-13 MED ORDER — SODIUM BICARBONATE 650 MG PO TABS: 650.0000 mg | ORAL_TABLET | Freq: Every day | ORAL | 0 refills | Status: DC

## 2018-08-13 MED ORDER — FUROSEMIDE 40 MG PO TABS: 80.0000 mg | ORAL_TABLET | Freq: Two times a day (BID) | ORAL | 0 refills | Status: DC

## 2018-08-13 MED ORDER — FUROSEMIDE 80 MG PO TABS: 80.0000 mg | ORAL_TABLET | Freq: Two times a day (BID) | ORAL | Status: DC

## 2018-08-13 NOTE — Plan of Care (Signed)
Nutrition Education Note  RD working remotely.   RD consulted for nutrition education regarding CHF and diabetes.   Lab Results  Component Value Date   HGBA1C 7.5 (H) 08/11/2018   PTA DM medications are 14 units insulin aspart TID and 12 units insulin glargine daily. Per ADA's Standards of Medical Care of Diabetes, glycemic targets for older adults who have multiple co-morbidities, cognitive impairments, and functional dependence should be less stringent (Hgb A1c <8.0-8.5). Discussed glycemic control PTA with pt; he checks his CBG daily before breakfast and is usually around 130. He denies any difficulty obtaining his medications and is followed by the [place]. He was able to confirm his home insulin regimen to this RD.  Labs reviewed: CBGS: 144-152 (inpatient orders for glycemic control are 0-5 units insulin aspart q HS, 0-9 units insulin aspart TID with meals, and 12 units insulin glargine daily).   Spoke with pt on phone, who was pleasant and in good spirits today. He reports he will likely discharge home today. Pt reports he has a good appetite, consuming 3 meals per day (pt still very independent- lives by himself, does his cooking and grocery shopping, and still drives). He consumes 3 meals per day PTA (Breakfast: sausage, eggs, occasionally gravy, coffee with cream and a spoonful of sugar; Lunch: slider at steak and shake with a coke; Dinner: pork chops, mustard greens, corn bread, and sweet tea).   Focus of discussion was how to decrease sodium in diet in terms of added salt while cooking and choosing healthier options of restaurants. Also discussed importance of self-management, including daily weights and close follow-up with cardiologist and PCP. Pt with no further questions and reports he will see his [facility] the [facility] tomorrow. He was very appreciative of call.  RD provided "Heart Healthy, Consistent Carbohydrate Nutrition Therapy" handout from the Academy of Nutrition and Dietetics; text of  handouts pasted into AVS/ discharge instructions and pt is aware that this is how he will receive written information on discharge. Reviewed patient's dietary recall. Provided examples on ways to decrease sodium intake in diet. Discouraged intake of processed foods and use of salt shaker. Encouraged fresh fruits and vegetables as well as whole grain sources of carbohydrates to maximize fiber intake.   RD discussed why it is important for patient to adhere to diet recommendations, and emphasized the role of fluids, foods to avoid, and importance of weighing self daily.   Discussed different food groups and their effects on blood sugar, emphasizing carbohydrate-containing foods. Provided list of carbohydrates and recommended serving sizes of common foods.  Discussed importance of controlled and consistent carbohydrate intake throughout the day. Provided examples of ways to balance meals/snacks and encouraged intake of high-fiber, whole grain complex carbohydrates. Teach back method used.  Expect fair compliance.  Body mass index is 23.85 kg/m. Pt meets criteria for normal weight range based on current BMI.  Current diet order is heart healthy/ carb modified with 1.5 L fluid restriction patient is consuming approximately 70% of meals at this time. Labs and medications reviewed. No further nutrition interventions warranted at this time. RD contact information provided. If additional nutrition issues arise, please re-consult RD.   [place] Alfredo A. Jimmye Norman, RD, LDN, [place] Registered Dietitian II Certified Diabetes Care and Education Specialist Pager: [phone] After hours Pager: [phone]

## 2018-08-13 NOTE — Progress Notes (Signed)
   08/13/18 1050  Mobility  Activity Ambulated in hall;Dangled on edge of bed  Range of Motion Active;All extremities  Level of Assistance Independent  Assistive Device None  Minutes Stood 5 minutes  Minutes Ambulated 5 minutes  Distance Ambulated (ft) 500 ft  Mobility Response Tolerated well  Bed Position Semi-fowlers   SATURATION QUALIFICATIONS: (This note is used to comply with regulatory documentation for home oxygen)  Patient Saturations on Room Air at Rest = 100%  Patient Saturations on Room Air while Ambulating = 98%  Patient Saturations on n/a Liters of oxygen while Ambulating = n/a%  Please briefly explain why patient needs home oxygen:

## 2018-08-13 NOTE — Discharge Instructions (Signed)
Heart Healthy, Consistent Carbohydrate Nutrition Therapy   A heart-healthy and consistent carbohydrate diet is recommended to manage heart disease and diabetes. To follow a heart-healthy and consistent carbohydrate diet, Eat a balanced diet with whole grains, fruits and vegetables, and lean protein sources.  Choose heart-healthy unsaturated fats. Limit saturated fats, trans fats, and cholesterol intake. Eat more plant-based or vegetarian meals using beans and soy foods for protein.  Eat whole, unprocessed foods to limit the amount of sodium (salt) you eat.  Choose a consistent amount of carbohydrate at each meal and snack. Limit refined carbohydrates especially sugar, sweets and sugar-sweetened beverages.  If you drink alcohol, do so in moderation: one serving per day (women) and two servings per day (men). o One serving is equivalent to 12 ounces beer, 5 ounces wine, or 1.5 ounces distilled spirits  Tips Tips for Choosing Heart-Healthy Fats Choose lean protein and low-fat dairy foods to reduce saturated fat intake. Saturated fat is usually found in animal-based protein and is associated with certain health risks. Saturated fat is the biggest contributor to raise low-density lipoprotein (LDL) cholesterol levels. Research shows that limiting saturated fat lowers unhealthy cholesterol levels. Eat no more than 7% of your total calories each day from saturated fat. Ask your RDN to help you determine how much saturated fat is right for you. There are many foods that do not contain large amounts of saturated fats. Swapping these foods to replace foods high in saturated fats will help you limit the saturated fat you eat and improve your cholesterol levels. You can also try eating more plant-based or vegetarian meals. Instead of. Try:  Whole milk, cheese, yogurt, and ice cream 1% or skim milk, low-fat cheese, non-fat yogurt, and low-fat ice cream  Fatty, marbled beef and pork Lean beef, pork, or venison   Poultry with skin Poultry without skin  Butter, stick margarine Reduced-fat, whipped, or liquid spreads  Coconut oil, palm oil Liquid vegetable oils: corn, canola, olive, soybean and safflower oils   Avoid foods that contain trans fats. Trans fats increase levels of LDL-cholesterol. Hydrogenated fat in processed foods is the main source of trans fats in foods.  Trans fats can be found in stick margarine, shortening, processed sweets, baked goods, some fried foods, and packaged foods made with hydrogenated oils. Avoid foods with "partially hydrogenated oil" on the ingredient list such as: cookies, pastries, baked goods, biscuits, crackers, microwave popcorn, and frozen dinners. Choose foods with heart healthy fats. Polyunsaturated and monounsaturated fat are unsaturated fats that may help lower your blood cholesterol level when used in place of saturated fat in your diet. Ask your RDN about taking a dietary supplement with plant sterols and stanols to help lower your cholesterol level. Research shows that substituting saturated fats with unsaturated fats is beneficial to cholesterol levels. Try these easy swaps: Instead of. Try:  Butter, stick margarine, or solid shortening Reduced-fat, whipped, or liquid spreads  Beef, pork, or poultry with skin Fish and seafood  Chips, crackers, snack foods Raw or unsalted nuts and seeds or nut butters Hummus with vegetables Avocado on toast  Coconut oil, palm oil Liquid vegetable oils: corn, canola, olive, soybean and safflower oils  Limit the amount of cholesterol you eat to less than 200 milligrams per day. Cholesterol is a substance carried through the bloodstream via lipoproteins, which are known as "transporters" of fat. Some body functions need cholesterol to work properly, but too much cholesterol in the bloodstream can damage arteries and build up blood vessel linings (  which can lead to heart attack and stroke). You should eat less than 200 milligrams  cholesterol per day. People respond differently to eating cholesterol. There is no test available right now that can figure out which people will respond more to dietary cholesterol and which will respond less. For individuals with high intake of dietary cholesterol, different types of increase (none, small, moderate, large) in LDL-cholesterol levels are all possible.  Food sources of cholesterol include egg yolks and organ meats such as liver, gizzards. Limit egg yolks to two to four per week and avoid organ meats like liver and gizzards to control cholesterol intake. Tips for Choosing Heart-Healthy Carbohydrates Consume a consistent amount of carbohydrate It is important to eat foods with carbohydrates in moderation because they impact your blood glucose level. Carbohydrates can be found in many foods such as: Grains (breads, crackers, rice, pasta, and cereals)  Starchy Vegetables (potatoes, corn, and peas)  Beans and legumes  Milk, soy milk, and yogurt  Fruit and fruit juice  Sweets (cakes, cookies, ice cream, jam and jelly) Your RDN will help you set a goal for how many carbohydrate servings to eat at your meals and snacks. For many adults, eating 3 to 5 servings of carbohydrate foods at each meal and 1 or 2 carbohydrate servings for each snack works well.  Check your blood glucose level regularly. It can tell you if you need to adjust when you eat carbohydrates. Choose foods rich in viscous (soluble) fiber Viscous, or soluble, is found in the walls of plant cells. Viscous fiber is found only in plant-based foods. Eating foods with fiber helps to lower your unhealthy cholesterol and keep your blood glucose in range  Rich sources of viscous fiber include vegetables (asparagus, Brussels sprouts, sweet potatoes, turnips) fruit (apricots, mangoes, oranges), legumes, and whole grains (barley, oats, and oat bran).  As you increase your fiber intake gradually, also increase the amount of water you  drink. This will help prevent constipation.  If you have difficulty achieving this goal, ask your RDN about fiber laxatives. Choose fiber supplements made with viscous fibers such as psyllium seed husks or methylcellulose to help lower unhealthy cholesterol.  Limit refined carbohydrates  There are three types of carbohydrates: starches, sugar, and fiber. Some carbohydrates occur naturally in food, like the starches in rice or corn or the sugars in fruits and milk. Refined carbohydrates--foods with high amounts of simple sugars--can raise triglyceride levels. High triglyceride levels are associated with coronary heart disease. Some examples of refined carbohydrate foods are table sugar, sweets, and beverages sweetened with added sugar. Tips for Reducing Sodium (Salt) Although sodium is important for your body to function, too much sodium can be harmful for people with high blood pressure. As sodium and fluid buildup in your tissues and bloodstream, your blood pressure increases. High blood pressure may cause damage to other organs and increase your risk for a stroke. Even if you take a pill for blood pressure or a water pill (diuretic) to remove fluid, it is still important to have less salt in your diet. Ask your doctor and RDN what amount of sodium is right for you. Avoid processed foods. Eat more fresh foods.  Fresh fruits and vegetables are naturally low in sodium, as well as frozen vegetables and fruits that have no added juices or sauces.  Fresh meats are lower in sodium than processed meats, such as bacon, sausage, and hotdogs. Read the nutrition label or ask your butcher to help you find a   fresh meat that is low in sodium. Eat less salt--at the table and when cooking.  A single teaspoon of table salt has 2,300 mg of sodium.  Leave the salt out of recipes for pasta, casseroles, and soups.  Ask your RDN how to cook your favorite recipes without sodium Be a smart shopper.  Look for food packages  that say "salt-free" or "sodium-free." These items contain less than 5 milligrams of sodium per serving.  "Very low-sodium" products contain less than 35 milligrams of sodium per serving.  "Low-sodium" products contain less than 140 milligrams of sodium per serving.  Beware for "Unsalted" or "No Added Salt" products. These items may still be high in sodium. Check the nutrition label. Add flavors to your food without adding sodium.  Try lemon juice, lime juice, fruit juice or vinegar.  Dry or fresh herbs add flavor. Try basil, bay leaf, dill, rosemary, parsley, sage, dry mustard, nutmeg, thyme, and paprika.  Pepper, red pepper flakes, and cayenne pepper can add spice t your meals without adding sodium. Hot sauce contains sodium, but if you use just a drop or two, it will not add up to much.  Buy a sodium-free seasoning blend or make your own at home. Additional Lifestyle Tips Achieve and maintain a healthy weight. Talk with your RDN or your doctor about what is a healthy weight for you. Set goals to reach and maintain that weight.  To lose weight, reduce your calorie intake along with increasing your physical activity. A weight loss of 10 to 15 pounds could reduce LDL-cholesterol by 5 milligrams per deciliter. Participate in physical activity. Talk with your health care team to find out what types of physical activity are best for you. Set a plan to get about 30 minutes of exercise on most days.  Foods Recommended Food Group Foods Recommended  Grains Whole grain breads and cereals, including whole wheat, barley, rye, buckwheat, corn, teff, quinoa, millet, amaranth, brown or wild rice, sorghum, and oats Pasta, especially whole wheat or other whole grain types  Brown rice, quinoa or wild rice Whole grain crackers, bread, rolls, pitas Home-made bread with reduced-sodium baking soda  Protein Foods Lean cuts of beef and pork (loin, leg, round, extra lean hamburger)  Skinless poultry Fish Venison  and other wild game Dried beans and peas Nuts and nut butters Meat alternatives made with soy or textured vegetable protein  Egg whites or egg substitute Cold cuts made with lean meat or soy protein  Dairy Nonfat (skim), low-fat, or 1%-fat milk  Nonfat or low-fat yogurt or cottage cheese Fat-free and low-fat cheese  Vegetables Fresh, frozen, or canned vegetables without added fat or salt   Fruits Fresh, frozen, canned, or dried fruit   Oils Unsaturated oils (corn, olive, peanut, soy, sunflower, canola)  Soft or liquid margarines and vegetable oil spreads  Salad dressings Seeds and nuts  Avocado   Foods Not Recommended Food Group Foods Not Recommended  Grains Breads or crackers topped with salt Cereals (hot or cold) with more than 300 mg sodium per serving Biscuits, cornbread, and other "quick" breads prepared with baking soda Bread crumbs or stuffing mix from a store High-fat bakery products, such as doughnuts, biscuits, croissants, danish pastries, pies, cookies Instant cooking foods to which you add hot water and stir--potatoes, noodles, rice, etc. Packaged starchy foods--seasoned noodle or rice dishes, stuffing mix, macaroni and cheese dinner Snacks made with partially hydrogenated oils, including chips, cheese puffs, snack mixes, regular crackers, butter-flavored popcorn  Protein Foods   Higher-fat cuts of meats (ribs, t-bone steak, regular hamburger) Bacon, sausage, or hot dogs Cold cuts, such as salami or bologna, deli meats, cured meats, corned beef Organ meats (liver, brains, gizzards, sweetbreads) Poultry with skin Fried or smoked meat, poultry, and fish Whole eggs and egg yolks (more than 2-4 per week) Salted legumes, nuts, seeds, or nut/seed butters Meat alternatives with high levels of sodium (>300 mg per serving) or saturated fat (>5 g per serving)  Dairy Whole milk,?2% fat milk, buttermilk Whole milk yogurt or ice cream Cream Half-&-half Cream cheese Sour  cream Cheese  Vegetables Canned or frozen vegetables with salt, fresh vegetables prepared with salt, butter, cheese, or cream sauce Fried vegetables Pickled vegetables such as olives, pickles, or sauerkraut  Fruits Fried fruits Fruits served with butter or cream  Oils Butter, stick margarine, shortening Partially hydrogenated oils or trans fats Tropical oils (coconut, palm, palm kernel oils)  Other Candy, sugar sweetened soft drinks and desserts Salt, sea salt, garlic salt, and seasoning mixes containing salt Bouillon cubes Ketchup, barbecue sauce, Worcestershire sauce, soy sauce, teriyaki sauce Miso Salsa Pickles, olives, relish   Heart Healthy Consistent Carbohydrate Vegetarian (Lacto-Ovo) Sample 1-Day Menu  Breakfast 1 cup oatmeal, cooked (2 carbohydrate servings)   cup blueberries (1 carbohydrate serving)  11 almonds, without salt  1 cup 1% milk (1 carbohydrate serving)  1 cup coffee  Morning Snack 1 cup fat-free plain yogurt (1 carbohydrate serving)  Lunch 1 whole wheat bun (1 carbohydrate servings)  1 black bean burger (1 carbohydrate servings)  1 slice cheddar cheese, low sodium  2 slices tomatoes  2 leaves lettuce  1 teaspoon mustard  1 small pear (1 carbohydrate servings)  1 cup green tea, unsweetened  Afternoon Snack 1/3 cup trail mix with nuts, seeds, and raisins, without salt (1 carbohydrate servinga)  Evening Meal  cup meatless chicken  2/3 cup brown rice, cooked (2 carbohydrate servings)  1 cup broccoli, cooked (2/3 carbohydrate serving)   cup carrots, cooked (1/3 carbohydrate serving)  2 teaspoons olive oil  1 teaspoon balsamic vinegar  1 whole wheat dinner roll (1 carbohydrate serving)  1 teaspoon margarine, soft, tub  1 cup 1% milk (1 carbohydrate serving)  Evening Snack 1 extra small banana (1 carbohydrate serving)  1 tablespoon peanut butter   Heart Healthy Consistent Carbohydrate Vegan Sample 1-Day Menu  Breakfast 1 cup oatmeal, cooked (2  carbohydrate servings)   cup blueberries (1 carbohydrate serving)  11 almonds, without salt  1 cup soymilk fortified with calcium, vitamin B12, and vitamin D  1 cup coffee  Morning Snack 6 ounces soy yogurt (1 carbohydrate servings)  Lunch 1 whole wheat bun(1 carbohydrate servings)  1 black bean burger (1 carbohydrate serving)  2 slices tomatoes  2 leaves lettuce  1 teaspoon mustard  1 small pear (1 carbohydrate servings)  1 cup green tea, unsweetened  Afternoon Snack 1/3 cup trail mix with nuts, seeds, and raisins, without salt (1 carbohydrate servings)  Evening Meal  cup meatless chicken  2/3 cup brown rice, cooked (2 carbohydrate servings)  1 cup broccoli, cooked (2/3 carbohydrate serving)   cup carrots, cooked (1/3 carbohydrate serving)  2 teaspoons olive oil  1 teaspoon balsamic vinegar  1 whole wheat dinner roll (1 carbohydrate serving)  1 teaspoon margarine, soft, tub  1 cup soymilk fortified with calcium, vitamin B12, and vitamin D  Evening Snack 1 extra small banana (1 carbohydrate serving)  1 tablespoon peanut butter    Heart Healthy Consistent Carbohydrate Sample   teaspoon balsamic vinegar  1 whole wheat dinner roll (1 carbohydrate serving)  1 teaspoon margarine, soft, tub  1 cup soymilk fortified with calcium, vitamin B12, and vitamin D  Evening Snack 1 extra small banana (1 carbohydrate serving)  1 tablespoon peanut butter    Heart Healthy Consistent Carbohydrate Sample 1-Day Menu  Breakfast 1 cup cooked oatmeal (2 carbohydrate servings)  3/4 cup blueberries (1 carbohydrate serving)  1 ounce almonds  1 cup skim milk (1 carbohydrate serving)  1 cup coffee  Morning Snack 1 cup sugar-free nonfat yogurt (1 carbohydrate serving)  Lunch 2 slices whole-wheat bread (2 carbohydrate servings)  2 ounces lean [place] breast  1 ounce low-fat Swiss cheese  1 teaspoon mustard  1 slice tomato  1 lettuce leaf  1 small pear (1 carbohydrate serving)  1 cup skim milk (1 carbohydrate serving)  Afternoon Snack 1 ounce trail mix with unsalted nuts, seeds, and raisins (1 carbohydrate serving)  Evening Meal 3 ounces salmon  2/3 cup cooked brown rice (2 carbohydrate servings)  1 teaspoon  soft margarine  1 cup cooked broccoli with 1/2 cup cooked carrots (1 carbohydrate serving  Carrots, cooked, boiled, drained, without salt  1 cup lettuce  1 teaspoon olive oil with vinegar for dressing  1 small whole grain roll (1 carbohydrate serving)  1 teaspoon soft margarine  1 cup unsweetened tea  Evening Snack 1 extra-small banana (1 carbohydrate serving)  Copyright 2020  [employer]. All rights reserved.  Please try to weight yourself every day and record it! Try to weigh around the same time every day for the most accurate readings and trends.

## 2018-08-13 NOTE — Discharge Summary (Signed)
Physician Discharge Summary  [name] DPO:[id number] DOB: 1931/06/15 DOA: 08/11/2018  PCP: [name], [name], MD  Admit date: 08/11/2018 Discharge date: 08/13/2018  Admitted From: Home Disposition: Home  Recommendations for Outpatient Follow-up:  1. Follow up with PCP in 1-2 weeks  2. Follow-up with nephrology for potassium and renal function monitoring in a week for lab draw Follow-up with cardiology. Home Health: None Equipment/Devices: None  Discharge Condition: Stable CODE STATUS: Full code Diet recommendation: Cardiac, low-salt diet, 1500 mL fluid restriction  Brief Narrative: Per HPI: 85 y.o. male with medical history significant of HTN, HLD, CAD, DM, PVD, CKD stage IV, PUD, GERD, and AVMs; who presented to [facility] with complaints of shortness of breath.  He reports "getting short-winded, found out that I had fluid on my lungs."  Symptoms started  Night of 6/12 while sitting up in a chair.  He felt ok 6/13. He feels better sitting up than lying down.  No weight changes.  No LE edema.  In ER, BUN 61, creatinine 3.92, BNP 969, and troponin 0.05. Patient noted to be hypoxic down to 86% on room air currently on 4 L to maintain O2 saturations greater than 92%. CXRnew bilateral opacities concerning for pulmonary edema with small bilateral effusions. Patient was given 80 mg of Lasix IV Patient was admitted for further management, seen by cardiology. Patient was diuresed well.  Hypoxia resolved.  He is able to ambulate on room air, doing well no shortness of breath or leg edema.  Seen by cardiology and cleared for discharge this evening.  He is being discharged home in medically stable condition.  Assessment & Plan:   Acute on chronic systolic CHF : Volume status seems to be overall improving however weight seems to be up.  Clinically he seems to be improved.Appreciate cardiology input.  Continue his home hydralazine, Imdur, carvedilol.  Cardiology advised to discharge on  80 of Lasix twice daily.  Patient reports he has enough supplies.  He would like to be discharged this evening.  educated on diet and chf  education.   Acute hypoxic respiratory failure due to #1, 86% room air on admission.  Resolved able to come off oxygen.  History of CAD status post CABG in September 2016.  No chest pain.  Continue home aspirin, Lipitor, carvedilol  Dyslipidemia: on Statin.  Hypokalemia will replete orally.  Prescribed 20 of potassium chloride daily with monitoring of renal function closely especially while he is on diuretics  Essential hypertension : Blood pressure controlled on Imdur, hydralazine, clonidine and carvedilol  CKD (chronic kidney disease), stage IV/metabolic acidosis: Baseline creatinine 3.3 ( June 2019)-4.1 9 10/2016): More or less stable.  Monitor while on diuresis.  Continue sodium bicarb.  Patient is followed by outpatient nephrology and is going to make an appointment for a blood test within a week, patient verbalized understanding.  Recent Labs  Lab 08/11/18 0800 08/12/18 0633 08/13/18 0459  CREATININE 3.92* 3.97* 4.15*   Insulin dependent diabetes mellitus : A1c 7.5, blood sugar stable on sliding scale insulin.  History of stomach ulcers with GI bleeding while on Plavix history of duodenal AVM.  No active bleeding.  Hemoglobin stable.  Hx of PVD on pentoxifylline.  Follow-up DVT prophylaxis:sq lovenox Code Status: full code Family Communication: Plan of care discussed with the patient.   Disposition Plan: Is being discharged home after cardiac clearance this afternoon.   Consultants:  Cardiology  Procedures: None  Discharge Diagnoses:  Principal Problem:   Acute exacerbation of  CHF (congestive heart failure) (HCC) Active Problems:   Dyslipidemia   Essential hypertension   CKD (chronic kidney disease), stage IV (HCC)   Chronic combined systolic and diastolic heart failure (HCC)   Insulin dependent diabetes mellitus (HCC)   Acute  on chronic respiratory failure with hypoxia [facility])    Discharge Instructions  Discharge Instructions    (HEART FAILURE PATIENTS) Call MD:  Anytime you have any of the following symptoms: 1) 3 pound weight gain in 24 hours or 5 pounds in 1 week 2) shortness of breath, with or without a dry hacking cough 3) swelling in the hands, feet or stomach 4) if you have to sleep on extra pillows at night in order to breathe.   Complete by: As directed    Diet - low sodium heart healthy   Complete by: As directed    Increase activity slowly   Complete by: As directed      Allergies as of 08/13/2018   No Known Allergies     Medication List    TAKE these medications   accu-chek softclix lancets USE TO TEST BLOOD GLUCOSE THREE TIMES DAILY   Accu-Chek Softclix Lancets lancets CHECK BLOOD GLUCOSE THREE TIMES DAILY   acetaminophen 500 MG tablet Commonly known as: TYLENOL Take 500 mg by mouth every 8 (eight) hours as needed for mild pain or headache.   Alcohol Prep Pads USE TO TEST BLOOD GLUCOSE THREE TIMES DAILY   amLODipine 10 MG tablet Commonly known as: NORVASC TAKE 1 TABLET EVERY DAY   aspirin EC 81 MG tablet Take 81 mg by mouth at bedtime.   atorvastatin 80 MG tablet Commonly known as: LIPITOR Take 1 tablet (80 mg total) by mouth daily at 6 PM. Pt needs to call and make appt before anymore refills   BD Pen Needle Nano U/F 32G X 4 MM Misc Generic drug: Insulin Pen Needle INJECT TWO TIMES DAILY AS NEEDED.   bisacodyl 5 MG EC tablet Commonly known as: DULCOLAX Take 5 mg by mouth daily as needed for moderate constipation.   carvedilol 25 MG tablet Commonly known as: COREG TAKE 1 TABLET TWICE DAILY WITH MEALS   cloNIDine 0.1 MG tablet Commonly known as: CATAPRES Take 1 tablet (0.1 mg total) by mouth at bedtime.   furosemide 40 MG tablet Commonly known as: LASIX Take 2 tablets (80 mg total) by mouth 2 (two) times daily. What changed: See the new instructions.   glucose  blood test strip Commonly known as: Accu-Chek Aviva Plus USE TO TEST BLOOD GLUCOSE THREE TIMES DAILY   hydrALAZINE 25 MG tablet Commonly known as: APRESOLINE TAKE 1 TABLET (25 MG TOTAL) 3 TIMES DAILY. What changed: See the new instructions.   Insulin Glargine 100 UNIT/ML Solostar Pen Commonly known as: Lantus SoloStar Inject 12 Units into the skin daily at 10 pm.   Iron 325 (65 Fe) MG Tabs Take 1 tablet by mouth daily. What changed: when to take this   isosorbide mononitrate 60 MG 24 hr tablet Commonly known as: IMDUR TAKE 2 TABLETS EVERY DAY   latanoprost 0.005 % ophthalmic solution Commonly known as: XALATAN Place 1 drop into both eyes at bedtime.   nitroGLYCERIN 0.4 MG SL tablet Commonly known as: NITROSTAT Place 1 tablet (0.4 mg total) under the tongue every 5 (five) minutes as needed for chest pain (3 doses MAX).   NovoLOG FlexPen 100 UNIT/ML FlexPen Generic drug: insulin aspart Inject 14 Units into the skin 3 (three) times daily with meals.  OVER THE COUNTER MEDICATION Take 1 tablet by mouth daily. Prostate formula   pentoxifylline 400 MG CR tablet Commonly known as: TRENTAL Take 1 tablet (400 mg total) by mouth 2 (two) times daily. (Take at 10 am and 5 pm)   polyethylene glycol 17 g packet Commonly known as: MIRALAX / GLYCOLAX Take 17 g by mouth daily as needed for mild constipation or moderate constipation.   potassium chloride SA 20 MEQ tablet Commonly known as: K-DUR Take 1 tablet (20 mEq total) by mouth daily for 7 days. Start taking on: August 14, 2018   sodium bicarbonate 650 MG tablet Take 1 tablet (650 mg total) by mouth daily for 30 days. Start taking on: August 14, 2018   timolol 0.5 % ophthalmic solution Commonly known as: TIMOPTIC Place 1 drop into both eyes every morning.   Travatan Z 0.004 % Soln ophthalmic solution Generic drug: Travoprost (BAK Free) Place 1 drop into both eyes every evening.       No Known  Allergies  Consultations: Cardiology  Procedures/Studies: Dg Chest 2 View  Result Date: 08/11/2018 CLINICAL DATA:  Shortness of breath beginning yesterday. Previous myocardial infarction and congestive heart failure EXAM: CHEST - 2 VIEW COMPARISON:  05/25/2015 FINDINGS: Stable mild cardiomegaly. Prior CABG. New mild to moderate symmetric bilateral pulmonary airspace disease is seen, suspicious for pulmonary edema. Tiny bilateral pleural effusions also noted. IMPRESSION: New symmetric bilateral pulmonary airspace disease, likely due to pulmonary edema/congestive heart failure. Tiny bilateral pleural effusions. Electronically Signed   By: Earle Gell M.D.   On: 08/11/2018 08:51     Subjective: Overall feels much improved, no leg edema no chest pain.  Off oxygen.  Weight is slightly up at 166 pound from 160 pound, uop 1.5 L past 24 hours.  Wants to go home today.     Discharge Exam: Vitals:   08/13/18 0928 08/13/18 1133  BP: (!) 124/52 132/63  Pulse: 68 65  Resp:  18  Temp:  98.1 F (36.7 C)  SpO2:  100%   Vitals:   08/13/18 0337 08/13/18 0516 08/13/18 0928 08/13/18 1133  BP: 125/65 126/67 (!) 124/52 132/63  Pulse: 78  68 65  Resp: 18   18  Temp: 99.3 F (37.4 C)   98.1 F (36.7 C)  TempSrc: Oral   Oral  SpO2: 98%   100%  Weight: 75.4 kg     Height:        General: Pt is alert, awake, not in acute distress Cardiovascular: RRR, S1/S2 +, no rubs, no gallops Respiratory: CTA bilaterally, no wheezing, no rhonchi Abdominal: Soft, NT, ND, bowel sounds + Extremities: no edema, no cyanosis   The results of significant diagnostics from this hospitalization (including imaging, microbiology, ancillary and laboratory) are listed below for reference.     Microbiology: Recent Results (from the past 240 hour(s))  SARS Coronavirus 2 (Hosp order,Performed in [facility] lab via [employer] ID)     Status: None   Collection Time: 08/11/18  8:05 AM   Specimen: Dry Nasal Swab (Abbott ID  Now)  Result Value Ref Range Status   SARS Coronavirus 2 (Abbott ID Now) NEGATIVE NEGATIVE Final    Comment: (NOTE) Interpretive Result Comment(s): COVID 19 Positive SARS CoV 2 target nucleic acids are DETECTED. The SARS CoV 2 RNA is generally detectable in upper and lower respiratory specimens during the acute phase of infection.  Positive results are indicative of active infection with SARS CoV 2.  Clinical correlation with patient history  and other diagnostic information is necessary to determine patient infection status.  Positive results do not rule out bacterial infection or coinfection with other viruses. The expected result is Negative. COVID 19 Negative SARS CoV 2 target nucleic acids are NOT DETECTED. The SARS CoV 2 RNA is generally detectable in upper and lower respiratory specimens during the acute phase of infection.  Negative results do not preclude SARS CoV 2 infection, do not rule out coinfections with other pathogens, and should not be used as the sole basis for treatment or other patient management decisions.  Negative results must be combined with clinical  observations, patient history, and epidemiological information. The expected result is Negative. Invalid Presence or absence of SARS CoV 2 nucleic acids cannot be determined. Repeat testing was performed on the submitted specimen and repeated Invalid results were obtained.  If clinically indicated, additional testing on a new specimen with an alternate test methodology [phone]) is advised.  The SARS CoV 2 RNA is generally detectable in upper and lower respiratory specimens during the acute phase of infection. The expected result is Negative. Fact Sheet for Patients:  GolfingFamily.no Fact Sheet for Healthcare Providers: https://www.hernandez-brewer.com/ This test is not yet approved or cleared by the [place] FDA and has been authorized for detection and/or diagnosis  of SARS CoV 2 by FDA under an Emergency Use Authorization (EUA).  This EUA will remain in effect (meaning this test can be used) for the duration of the COVID19 d eclaration under Section 564(b)(1) of the Act, 21 U.S.C. section [phone] 3(b)(1), unless the authorization is terminated or revoked sooner. Performed at [facility], [address] [place], [place] [address]   SARS Coronavirus 2     Status: None   Collection Time: 08/11/18  1:21 PM  Result Value Ref Range Status   SARS Coronavirus 2 NOT DETECTED NOT DETECTED Final    Comment: (NOTE) SARS-CoV-2 target nucleic acids are NOT DETECTED. The SARS-CoV-2 RNA is generally detectable in upper and lower respiratory specimens during the acute phase of infection.  Negative  results do not preclude SARS-CoV-2 infection, do not rule out co-infections with other pathogens, and should not be used as the sole basis for treatment or other patient management decisions.  Negative results must be combined with clinical observations, patient history, and epidemiological information. The expected result is Not Detected. Fact Sheet for Patients: http://www.biofiredefense.com/wp-content/uploads/2020/03/BIOFIRE-COVID -19-patients.pdf Fact Sheet for Healthcare Providers: http://www.biofiredefense.com/wp-content/uploads/2020/03/BIOFIRE-COVID -19-hcp.pdf This test is not yet approved or cleared by the [place] and  has been authorized for detection and/or diagnosis of SARS-CoV-2 by FDA under an Emergency Use Authorization (EUA).  This EUA will remain in effec t (meaning this test can be used) for the duration of  the COVID-19 declaration under Section 564(b)(1) of the Act, 21 U.S.C. section 360bbb-3(b)(1), unless the authorization is terminated or revoked sooner. Performed at [facility], [place] [address], [place], [place] [address]      Labs: BNP (last 3 results) Recent Labs    08/11/18 0800  BNP 969.0*    Basic Metabolic Panel: Recent Labs  Lab 08/11/18 0800 08/12/18 0633 08/13/18 0459  NA 137 138 137  K 3.8 3.2* 3.3*  CL 105 105 105  CO2 21* 19* 20*  GLUCOSE 189* 150* 129*  BUN 61* 61* 64*  CREATININE 3.92* 3.97* 4.15*  CALCIUM 9.1 9.5 9.0   Liver Function Tests: Recent Labs  Lab 08/11/18 0800  AST 19  ALT 20  ALKPHOS 74  BILITOT 1.0  PROT 8.0  ALBUMIN 4.4   No results for input(s): LIPASE, AMYLASE in the last 168 hours. No results for input(s): AMMONIA in the last 168 hours. CBC: Recent Labs  Lab 08/11/18 0800 08/12/18 0633  WBC 10.5 10.5  NEUTROABS 8.7* 9.0*  HGB 11.5* 11.8*  HCT 35.9* 35.6*  MCV 96.8 93.7  PLT 162 188   Cardiac Enzymes: Recent Labs  Lab 08/11/18 0800  TROPONINI 0.05*   BNP: Invalid input(s): POCBNP CBG: Recent Labs  Lab 08/12/18 1251 08/12/18 1709 08/12/18 2105 08/13/18 0615 08/13/18 1121  GLUCAP 163* 161* 144* 152* 157*   D-Dimer No results for input(s): DDIMER in the last 72 hours. Hgb A1c Recent Labs    08/11/18 1519  HGBA1C 7.5*   Lipid Profile No results for input(s): CHOL, HDL, LDLCALC, TRIG, CHOLHDL, LDLDIRECT in the last 72 hours. Thyroid function studies No results for input(s): TSH, T4TOTAL, T3FREE, THYROIDAB in the last 72 hours.  Invalid input(s): FREET3 Anemia work up No results for input(s): VITAMINB12, FOLATE, FERRITIN, TIBC, IRON, RETICCTPCT in the last 72 hours. Urinalysis    Component Value Date/Time   COLORURINE YELLOW 05/17/2015 1747   APPEARANCEUR CLEAR 05/17/2015 1747   LABSPEC 1.015 05/17/2015 1747   PHURINE 5.0 05/17/2015 1747   GLUCOSEU NEGATIVE 05/17/2015 1747   HGBUR NEGATIVE 05/17/2015 1747   HGBUR trace-intact 01/12/2007 0835   BILIRUBINUR SMALL (A) 05/17/2015 1747   BILIRUBINUR n 01/22/2013 1029   KETONESUR NEGATIVE 05/17/2015 1747   PROTEINUR 30 (A) 05/17/2015 1747   UROBILINOGEN 0.2 01/22/2013 1029   UROBILINOGEN 1.0 06/09/2011 1548   NITRITE NEGATIVE 05/17/2015 1747    LEUKOCYTESUR NEGATIVE 05/17/2015 1747   Sepsis Labs Invalid input(s): PROCALCITONIN,  WBC,  LACTICIDVEN Microbiology Recent Results (from the past 240 hour(s))  SARS Coronavirus 2 (Hosp order,Performed in [place] lab via [employer] ID)     Status: None   Collection Time: 08/11/18  8:05 AM   Specimen: Dry Nasal Swab (A[facility]ott ID Now)  Result Value Ref Range Status   SARS Coronavirus 2 (A[employer]ott ID Now) NEGATIVE NEGATIVE Final    Comment: (NOTE) Interpretive Result Comment(s): COVID 19 Positive SARS CoV 2 target nucleic acids are DETECTED. The SARS CoV 2 RNA is generally detectable in upper and lower respiratory specimens during the acute phase of infection.  Positive results are indicative of active infection with SARS CoV 2.  Clinical correlation with patient history and other diagnostic information is necessary to determine patient infection status.  Positive results do not rule out bacterial infection or coinfection with other viruses. The expected result is Negative. COVID 19 Negative SARS CoV 2 target nucleic acids are NOT DETECTED. The SARS CoV 2 RNA is generally detectable in upper and lower respiratory specimens during the acute phase of infection.  Negative results do not preclude SARS CoV 2 infection, do not rule out coinfections with other pathogens, and should not be used as the sole basis for treatment or other patient management decisions.  Negative results must be combined with clinical  observations, patient history, and epidemiological information. The expected result is Negative. Invalid Presence or absence of SARS CoV 2 nucleic acids cannot be determined. Repeat testing was performed on the submitted specimen and repeated Invalid results were obtained.  If clinically indicated, additional testing on a new specimen with an alternate test methodology [phone]) is advised.  The SARS CoV 2 RNA is generally detectable in upper and lower respiratory specimens during  the acute phase of infection. The  expected result is Negative. Fact Sheet for Patients:  GolfingFamily.no Fact Sheet for Healthcare Providers: https://www.hernandez-brewer.com/ This test is not yet approved or cleared by the [place] FDA and has been authorized for detection and/or diagnosis of SARS CoV 2 by FDA under an Emergency Use Authorization (EUA).  This EUA will remain in effect (meaning this test can be used) for the duration of the COVID19 d eclaration under Section 564(b)(1) of the Act, 21 U.S.C. section [phone] 3(b)(1), unless the authorization is terminated or revoked sooner. Performed at [facility], [address] [place], [place] [address]   SARS Coronavirus 2     Status: None   Collection Time: 08/11/18  1:21 PM  Result Value Ref Range Status   SARS Coronavirus 2 NOT DETECTED NOT DETECTED Final    Comment: (NOTE) SARS-CoV-2 target nucleic acids are NOT DETECTED. The SARS-CoV-2 RNA is generally detectable in upper and lower respiratory specimens during the acute phase of infection.  Negative  results do not preclude SARS-CoV-2 infection, do not rule out co-infections with other pathogens, and should not be used as the sole basis for treatment or other patient management decisions.  Negative results must be combined with clinical observations, patient history, and epidemiological information. The expected result is Not Detected. Fact Sheet for Patients: http://www.biofiredefense.com/wp-content/uploads/2020/03/BIOFIRE-COVID -19-patients.pdf Fact Sheet for Healthcare Providers: http://www.biofiredefense.com/wp-content/uploads/2020/03/BIOFIRE-COVID -19-hcp.pdf This test is not yet approved or cleared by the [place] and  has been authorized for detection and/or diagnosis of SARS-CoV-2 by FDA under an Emergency Use Authorization (EUA).  This EUA will remain in effec t (meaning this test can be used) for the  duration of  the COVID-19 declaration under Section 564(b)(1) of the Act, 21 U.S.C. section 360bbb-3(b)(1), unless the authorization is terminated or revoked sooner. Performed at [facility], [place] [address] [place], [place] [address]      Time coordinating discharge: 25 minutes  SIGNED:   Antonieta Pert, MD  [facility] 08/13/2018, 4:26 PM  If 7PM-7AM, please contact night-coverage [email or web address]

## 2018-08-13 NOTE — Consult Note (Addendum)
The patient has been seen in conjunction with Brandon Bellis, NP. All aspects of care have been considered and discussed. The patient has been personally interviewed, examined, and all clinical data has been reviewed.   [name]. [name] is well-known to me.  He has significant comorbid conditions including stage IV CKD, longstanding diabetes mellitus type 2, coronary artery bypass grafting with graft failure and dependence on the LIMA to LAD as his only patent conduit, and chronic combined systolic and diastolic heart failure.  There has been 18-week timeframe during which he has noticed increasing shortness of breath on exertion culminating in orthopnea and inability to sleep over this past weekend.  He was felt to be in heart failure both clinically and on chest x-ray.  IV furosemide has led to a 1-1/2 L diuresis.  He now feels back to normal.  He has not had chest discomfort, ischemic EKG changes, or abnormal cardiac markers.  Impression is that of acute on chronic combined systolic and diastolic heart failure.  The inciting incident is uncertain but is probably multifactorial including slightly worsening chronic kidney disease with fluid retention, age, dietary indiscretion, and although LVEF is unchanged (30 to 35%) there could be a component of increasing diastolic dysfunction.  He is ready for discharge this evening or in a.m.  He has baseline diuretic regimen should be furosemide 80 mg twice daily.  He will need an early interval basic metabolic panel to assess kidney function.  He will need early follow-up with both cardiology and nephrology.  Other therapy should remain the same.   Cardiology Consultation:   Patient ID: [name] MRN: [id number]; DOB: 09/20/1931  Admit date: 08/11/2018 Date of Consult: 08/13/2018  Primary Care Provider: Isaac Holt, Brandon Halsted, MD Primary Cardiologist: Brandon Grooms, MD  Primary Electrophysiologist:  None    Patient Profile:    [name] is a 85 y.o. male with a hx of CAD s/p CABG with graft failure (only LIMA-LAD remaining), CKD IV, DM, Chronic systolic HF->EF 02-58%, PAD, HTN and HL who is being seen today for the evaluation of CHF at the request of [name].  History of Present Illness:   [name] is an 85 yo male with PMH noted above. Last cardiac cath was back in 10/2014 with only LIMA-LAD being patent. Last echo from 3/18 showed EF of 30-35% with diffuse hypokinesis and akinesis of the inferolateral wall, with G2DD. Mild AI, moderate LAE and mild TR with mildly elevated pulmonary pressure. Also with history of stomach ulcers with GI bleeding while on plavix. He was last seen in the office on 7/19 with [name]. Reported to be in his usual state of health, major complaint was exertional dyspnea. He was continued on his home medications without change. Weight at the office was 163lbs.  He currently lives at home. States he cares for himself. Complaint with meds including his lasix. 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 When asked about his diet, he reports sausage for breakfast often, vegs and pork chops. Does not appear he has a good grasp on his dietary choices in regards to his HF. Reports his weight runs around 160lbs prior to admission, and had not noticed a change, though I'm not sure he weighs on a daily basis. Developed worsening shortness of breath over the past 2 weeks, along with orthopnea and PND. No chest pain or dizziness.   Presented to [facility]  with symptoms on 6/13. Labs on admission showed stable electrolytes, Cr 3.92, BNP 969, Troponin I 0.05, Hgb 11.5, Hgb A1c 7.5. Weight was 165lbs. CXR with bilateral opacities with concern for edema, and small bilateral pleural effusions. Noted to desat into the mid 80s without the use of O2. He was given 80mg  IV lasix and transferred to [facility] for admission. EKG showed SR with 1st degree AVB and LVH. He was given several  doses of IV lasix, now net - 1.1L. Follow up echo was done showing continued EF of 30-35%. In talking with the patient, he feels much better after diuresis and has ambulated in the hallway without significant shortness of breath.   Past Medical History:  Diagnosis Date  . Arthritis    "touch in my fingers" (09/10/2014)  . Atrophic gastritis   . AVM (arteriovenous malformation) of duodenum, acquired   . B12 deficiency anemia    "stopped taking the shots in ~ 06/2014" (09/10/2014)  . Barrett's esophagus   . CAD (coronary artery disease)    a. H/o MI in 1998;  b. s/p CABG 2003. c. 10/2014 NSTEMI/Cath: LM 75, LAD 173m/d, D1 95, D2 50, LCX 100ost/p, OM1 80, OM2 80, RCA 100p/m, RPDA fills via L->L collats, LIMA->LAD ok, VG->dRCA 100, VG->OM1->OM2 99 prox to OM1 insertion->recanalated ->Tx with heparin,  VG->D1 100-->Med Rx.  . Cataract   . Chronic systolic CHF (congestive heart failure) (HCC)    a. EF 40-45% in 10/2014 (previously normal in 08/2014).  . CKD (chronic kidney disease), stage IV ([name])   . Diabetes 1.5, managed as type 1 ([name])   . Hiatal hernia 2018   endoscopy -Dr. Havery Holt  . History of stomach ulcers 2013  . Hyperlipidemia   . Hypertension   . Hypertensive heart disease   . Ischemic cardiomyopathy    a. 10/2014 Echo: EF 40-45%.  . Myocardial infarction ([place])   . PVD (peripheral vascular disease) ([place])   . Type II diabetes mellitus ([place])     Past Surgical History:  Procedure Laterality Date  . CARDIAC CATHETERIZATION  2003;   . CARDIAC CATHETERIZATION N/A 11/07/2014   Left Heart Cath; Brandon Crome, MD;   . CATARACT EXTRACTION, BILATERAL Bilateral   . CHOLECYSTECTOMY N/A 05/20/2015   Procedure: LAPAROSCOPIC CHOLECYSTECTOMY;  Surgeon: Brandon Keens, MD;  Location: [place];  Service: General;  Laterality: N/A;  . COLONOSCOPY    . COLONOSCOPY N/A 05/12/2015   Procedure: COLONOSCOPY;  Surgeon: Brandon Banister, MD;  Location: [place];  Service: Endoscopy;  Laterality: N/A;   . [place]   [name] 07/13/2010  . CORONARY ARTERY BYPASS GRAFT  2003   CABG X5  . ESOPHAGOGASTRODUODENOSCOPY  06/10/2011   Procedure: ESOPHAGOGASTRODUODENOSCOPY (EGD);  Surgeon: Ladene Artist, MD,FACG;  Location: [facility] ENDOSCOPY;  Service: Endoscopy;  Laterality: N/A;  . ESOPHAGOGASTRODUODENOSCOPY N/A 10/04/2016   Procedure: ESOPHAGOGASTRODUODENOSCOPY (EGD);  Surgeon: Manus Gunning, MD;  Location: [facility];  Service: Gastroenterology;  Laterality: N/A;     Home Medications:  Prior to Admission medications   Medication Sig Start Date End Date Taking? Authorizing Provider  acetaminophen (TYLENOL) 500 MG tablet Take 500 mg by mouth every 8 (eight) hours as needed for mild pain or headache.   Yes [provider]  amLODipine (NORVASC) 10 MG tablet TAKE 1 TABLET EVERY DAY Patient taking differently: Take 10 mg by mouth daily.  01/01/18  Yes Dorena Cookey, MD  aspirin EC 81 MG tablet Take 81 mg by mouth at  bedtime.   Yes [provider]  atorvastatin (LIPITOR) 80 MG tablet Take 1 tablet (80 mg total) by mouth daily at 6 PM. Pt needs to call and make appt before anymore refills 07/30/18  Yes Brandon Crome, MD  bisacodyl (DULCOLAX) 5 MG EC tablet Take 5 mg by mouth daily as needed for moderate constipation.   Yes [provider]  carvedilol (COREG) 25 MG tablet TAKE 1 TABLET TWICE DAILY WITH MEALS Patient taking differently: Take 25 mg by mouth 2 (two) times daily with a meal.  10/23/17  Yes Marletta Lor, MD  cloNIDine (CATAPRES) 0.1 MG tablet Take 1 tablet (0.1 mg total) by mouth at bedtime. 07/03/18  Yes [name], Brandon Halsted, MD  Ferrous Sulfate (IRON) 325 (65 Fe) MG TABS Take 1 tablet by mouth daily. Patient taking differently: Take 1 tablet by mouth 2 (two) times daily.  07/29/16  Yes Marletta Lor, MD  furosemide (LASIX) 40 MG tablet TAKE 1 AND 1/2 TABLETS EVERY MORNING  AND TAKE 1 TABLET EVERY EVENING Patient taking  differently: Take 40-60 mg by mouth See admin instructions. TAKE 1 AND 1/2 TABLETS EVERY MORNING (60mg )  AND TAKE 1 TABLET (40mg ) EVERY EVENING 07/13/18  Yes Brandon Crome, MD  hydrALAZINE (APRESOLINE) 25 MG tablet TAKE 1 TABLET (25 MG TOTAL) 3 TIMES DAILY. Patient taking differently: Take 25 mg by mouth 3 (three) times daily.  11/07/17  Yes Brandon Crome, MD  insulin aspart (NOVOLOG FLEXPEN) 100 UNIT/ML FlexPen Inject 14 Units into the skin 3 (three) times daily with meals.   Yes [provider]  Insulin Glargine (LANTUS SOLOSTAR) 100 UNIT/ML Solostar Pen Inject 12 Units into the skin daily at 10 pm. 11/02/17  Yes Marletta Lor, MD  isosorbide mononitrate (IMDUR) 60 MG 24 hr tablet TAKE 2 TABLETS EVERY DAY Patient taking differently: Take 120 mg by mouth daily.  07/13/18  Yes Brandon Crome, MD  latanoprost (XALATAN) 0.005 % ophthalmic solution Place 1 drop into both eyes at bedtime. 08/19/17  Yes [provider]  nitroGLYCERIN (NITROSTAT) 0.4 MG SL tablet Place 1 tablet (0.4 mg total) under the tongue every 5 (five) minutes as needed for chest pain (3 doses MAX). 04/04/18  Yes Brandon Crome, MD  OVER THE COUNTER MEDICATION Take 1 tablet by mouth daily. Prostate formula   Yes [provider]  pentoxifylline (TRENTAL) 400 MG CR tablet Take 1 tablet (400 mg total) by mouth 2 (two) times daily. (Take at 10 am and 5 pm) 06/22/18  Yes [name], Brandon Halsted, MD  timolol (TIMOPTIC) 0.5 % ophthalmic solution Place 1 drop into both eyes every morning. 04/27/15  Yes [provider]  ACCU-CHEK SOFTCLIX LANCETS lancets CHECK BLOOD GLUCOSE THREE TIMES DAILY 04/18/17   Marletta Lor, MD  Alcohol Swabs (ALCOHOL PREP) PADS USE TO TEST BLOOD GLUCOSE THREE TIMES DAILY 03/09/17   Marletta Lor, MD  BD PEN NEEDLE NANO U/F 32G X 4 MM MISC INJECT TWO TIMES DAILY AS NEEDED. 08/23/17   Marletta Lor, MD  glucose blood (ACCU-CHEK AVIVA PLUS) test strip USE TO TEST  BLOOD GLUCOSE THREE TIMES DAILY 07/03/18   [name], Brandon Halsted, MD  Lancet Devices [facility]) lancets USE TO TEST BLOOD GLUCOSE THREE TIMES DAILY 03/09/17   Marletta Lor, MD  polyethylene glycol [facility] / Floria Raveling) packet Take 17 g by mouth daily as needed for mild constipation or moderate constipation. Patient not taking: Reported on 08/11/2018  05/21/15   Rai, Ripudeep K, MD  TRAVATAN Z 0.004 % SOLN ophthalmic solution Place 1 drop into both eyes every evening. 04/27/15   [provider]    Inpatient Medications: Scheduled Meds: . amLODipine  10 mg Oral Daily  . aspirin EC  81 mg Oral QHS  . atorvastatin  80 mg Oral q1800  . carvedilol  25 mg Oral BID WC  . cloNIDine  0.1 mg Oral QHS  . enoxaparin (LOVENOX) injection  30 mg Subcutaneous Q24H  . furosemide  40 mg Intravenous Q12H  . hydrALAZINE  25 mg Oral Q8H  . insulin aspart  0-5 Units Subcutaneous QHS  . insulin aspart  0-9 Units Subcutaneous TID WC  . insulin glargine  12 Units Subcutaneous Q2200  . isosorbide mononitrate  120 mg Oral Daily  . latanoprost  1 drop Both Eyes QHS  . Melatonin  3 mg Oral QHS  . pentoxifylline  400 mg Oral BID  . potassium chloride  20 mEq Oral Daily  . senna-docusate  1 tablet Oral BID  . sodium bicarbonate  650 mg Oral Daily  . sodium chloride flush  3 mL Intravenous Q12H  . timolol  1 drop Both Eyes q morning - 10a   Continuous Infusions: . sodium chloride     PRN Meds: sodium chloride, acetaminophen, bisacodyl, ondansetron (ZOFRAN) IV, polyethylene glycol, sodium chloride flush  Allergies:   No Known Allergies  Social History:   Social History   Socioeconomic History  . Marital status: Widowed    Spouse name: Not on file  . Number of children: Not on file  . Years of education: Not on file  . Highest education level: Not on file  Occupational History  . Occupation: Retired  Scientific laboratory technician  . Financial resource strain: Not on file  . Food insecurity     Worry: Not on file    Inability: Not on file  . Transportation needs    Medical: Not on file    Non-medical: Not on file  Tobacco Use  . Smoking status: Former Smoker    Packs/day: 0.50    Years: 20.00    Pack years: 10.00    Types: Cigarettes  . Smokeless tobacco: Never Used  . Tobacco comment: "quit smoking cigarettes in the 1960s   Substance and Sexual Activity  . Alcohol use: No  . Drug use: No  . Sexual activity: Not Currently  Lifestyle  . Physical activity    Days per week: Not on file    Minutes per session: Not on file  . Stress: Not on file  Relationships  . Social Herbalist on phone: Not on file    Gets together: Not on file    Attends religious service: Not on file    Active member of club or organization: Not on file    Attends meetings of clubs or organizations: Not on file    Relationship status: Not on file  . Intimate partner violence    Fear of current or ex partner: Not on file    Emotionally abused: Not on file    Physically abused: Not on file    Forced sexual activity: Not on file  Other Topics Concern  . Not on file  Social History Narrative   Pt lives alone, family nearby.    Family History:    Family History  Problem Relation Age of Onset  . Stroke Mother   . Hypertension Mother   .  Cancer Mother   . Diabetes Other        brothers and sisters  . Coronary artery disease Other   . Hypertension Sister   . Heart attack Sister   . Colon cancer Neg Hx      ROS:  Please see the history of present illness.   All other ROS reviewed and negative.     Physical Exam/Data:   Vitals:   08/12/18 2355 08/13/18 0337 08/13/18 0516 08/13/18 0928  BP: 129/69 125/65 126/67 (!) 124/52  Pulse: 83 78  68  Resp:  18    Temp: 100 F (37.8 C) 99.3 F (37.4 C)    TempSrc: Oral Oral    SpO2: 97% 98%    Weight:  75.4 kg    Height:        Intake/Output Summary (Last 24 hours) at 08/13/2018 1116 Last data filed at 08/13/2018 0900 Gross  per 24 hour  Intake 743 ml  Output 1725 ml  Net -982 ml   Last 3 Weights 08/13/2018 08/12/2018 08/11/2018  Weight (lbs) 166 lb 3.2 oz 164 lb 4.8 oz 165 lb 14.4 oz  Weight (kg) 75.388 kg 74.526 kg 75.252 kg     Body mass index is 23.85 kg/m.  General:  Well nourished, well developed, in no acute distress HEENT: normal Neck: no JVD Vascular: No carotid bruits; Cardiac:  normal S1, S2; RRR; + systolic murmur  Lungs:  clear to auscultation bilaterally, no wheezing, rhonchi or rales  Abd: soft, nontender, no hepatomegaly  Ext: no edema Musculoskeletal:  No deformities, BUE and BLE strength normal and equal Skin: warm and dry  Neuro:  CNs 2-12 intact, no focal abnormalities noted Psych:  Normal affect   EKG:  The EKG was personally reviewed and demonstrates:  SR with 1st degree AVB, LVH  Relevant CV Studies:  Cath: 11/07/14   Bypass graft failure with total occlusion of the SVG to the RCA, total occlusion of the SVG to the diagonal, and thrombotic high-grade stenosis throughout the entirety of the sequential saphenous vein graft to the first and second obtuse marginal.  Widely patent left internal mammary graft to the LAD. Distal LAD supplies collaterals to the right coronary.  Severe native vessel coronary disease with total occlusion of the mid LAD, severe diffuse disease in the first diagonal, 50-70% distal left main, and total occlusion of the proximal RCA.  Left ventriculography was not performed. Acute heart failure, denoted by the markedly elevated LVEDP (35-40 mmHg) is present.   RECOMMENDATIONS:   Closely monitor kidney function  IV diuretic therapy to treat acute heart failure  IV heparin, may help to maintain patency in the thrombosed sequential saphenous vein graft. My thought is that this graft was totally occluded resulting in marked enzyme release. On IV heparin the vessel has subsequently recanalized. We should also add Plavix to the patient's current medical  regimen. Intervention on the sequential saphenous vein graft will require significant contrast. Final decision will be based upon clinical course, i.e., if recurrent angina/equivalent, may need to consider PCI.  Echo for LV assessment, with appropriate management dependent upon EF. This could include consideration of device therapy in the future if indicated.  Diagnostic Dominance: Co-dominant    TTE: 08/11/18  IMPRESSIONS    1. The left ventricle has moderate-severely reduced systolic function, with an ejection fraction of 30-35%. The cavity size was mildly dilated. Left ventricular diastolic Doppler parameters are consistent with restrictive filling. There is akinesis of  the basal to  mid inferior and inferolateral walls and the basal anterolateral wall.  2. There is mild mitral annular calcification present. No evidence of mitral valve stenosis. Mild mitral regurgitation.  3. The aortic valve is tricuspid. Mild calcification of the aortic valve. Aortic valve regurgitation is trivial by color flow Doppler. No stenosis of the aortic valve.  4. The aortic root is normal in size and structure.  5. Normal RV size with mildly decreased systolic function.  6. The inferior vena cava was dilated in size with <50% respiratory variability. PA systolic pressure 57 mmHg.  Laboratory Data:  Chemistry Recent Labs  Lab 08/11/18 0800 08/12/18 0633 08/13/18 0459  NA 137 138 137  K 3.8 3.2* 3.3*  CL 105 105 105  CO2 21* 19* 20*  GLUCOSE 189* 150* 129*  BUN 61* 61* 64*  CREATININE 3.92* 3.97* 4.15*  CALCIUM 9.1 9.5 9.0  GFRNONAA 13* 13* 12*  GFRAA 15* 15* 14*  ANIONGAP 11 14 12     Recent Labs  Lab 08/11/18 0800  PROT 8.0  ALBUMIN 4.4  AST 19  ALT 20  ALKPHOS 74  BILITOT 1.0   Hematology Recent Labs  Lab 08/11/18 0800 08/12/18 0633  WBC 10.5 10.5  RBC 3.71* 3.80*  HGB 11.5* 11.8*  HCT 35.9* 35.6*  MCV 96.8 93.7  MCH 31.0 31.1  MCHC 32.0 33.1  RDW 14.5 14.6  PLT 162 188    Cardiac Enzymes Recent Labs  Lab 08/11/18 0800  TROPONINI 0.05*   No results for input(s): TROPIPOC in the last 168 hours.  BNP Recent Labs  Lab 08/11/18 0800  BNP 969.0*    DDimer No results for input(s): DDIMER in the last 168 hours.  Radiology/Studies:  Dg Chest 2 View  Result Date: 08/11/2018 CLINICAL DATA:  Shortness of breath beginning yesterday. Previous myocardial infarction and congestive heart failure EXAM: CHEST - 2 VIEW COMPARISON:  05/25/2015 FINDINGS: Stable mild cardiomegaly. Prior CABG. New mild to moderate symmetric bilateral pulmonary airspace disease is seen, suspicious for pulmonary edema. Tiny bilateral pleural effusions also noted. IMPRESSION: New symmetric bilateral pulmonary airspace disease, likely due to pulmonary edema/congestive heart failure. Tiny bilateral pleural effusions. Electronically Signed   By: Earle Gell M.D.   On: 08/11/2018 08:51    Assessment and Plan:   [name] is a 85 y.o. male with a hx of CAD s/p CABG with graft failure (only LIMA-LAD remaining), CKD IV, DM, Chronic systolic HF->EF 63-01%, PAD, HTN and HL who is being seen today for the evaluation of CHF at the request of [name].  1. Acute on chronic HF: Reports being complaint with home meds prior to admission. Seems issues are likely related to his diet. He has diuresed with IV lasix and feels much better. Initially required the use of O2 on admission but has been able to wean to RA. Weight is slightly up today, though he is feeling much better. Suspect he could transtition back to oral dosing in the morning. 60mg  qam, and 40mg  qpm was home dose, but may need to increase slightly with PRN dosing for weight gain. Stressed the importance of diet. Dietary consult has been placed via primary -- continue BB, hydralazine, Imdur. No room for ACEi/ARB with his renal dysfunction  2. CAD: s/p CABG with last cath 9/16, only patent LIMA-LAD. Continue ASA, statin   3. CKD IV: baseline  appears between 3.5-4.0. Up to 4.15 today. Would hold evening dose, and recheck Cr in the morning.   4. HTN: continued  on current therapy  5. HL: on statin. Last lipid panel on 08/02/17 LDL 45  6. Hypokalemia: supplemented via primary  7. IDDM: Hgb A1c 7.5. Was better controlled last year, discussed the need for dietary intervention. -- dietary consult as above.  For questions or updates, please contact [place] Please consult [email or web address] for contact info under     Signed, Brandon Bellis, NP  08/13/2018 11:16 AM

## 2018-08-14 ENCOUNTER — Telehealth: Payer: Self-pay | Admitting: *Deleted

## 2018-08-14 ENCOUNTER — Ambulatory Visit (INDEPENDENT_AMBULATORY_CARE_PROVIDER_SITE_OTHER): Payer: Medicare HMO | Admitting: Internal Medicine

## 2018-08-14 ENCOUNTER — Encounter: Payer: Self-pay | Admitting: Internal Medicine

## 2018-08-14 ENCOUNTER — Other Ambulatory Visit: Payer: Self-pay

## 2018-08-14 VITALS — BP 120/60 | HR 66 | Temp 98.1°F | Wt 167.9 lb

## 2018-08-14 DIAGNOSIS — I5043 Acute on chronic combined systolic (congestive) and diastolic (congestive) heart failure: Secondary | ICD-10-CM

## 2018-08-14 DIAGNOSIS — Z09 Encounter for follow-up examination after completed treatment for conditions other than malignant neoplasm: Secondary | ICD-10-CM

## 2018-08-14 DIAGNOSIS — E538 Deficiency of other specified B group vitamins: Secondary | ICD-10-CM | POA: Diagnosis not present

## 2018-08-14 MED ORDER — CYANOCOBALAMIN 1000 MCG/ML IJ SOLN
1000.0000 ug | Freq: Once | INTRAMUSCULAR | Status: AC
  Administered 2018-08-14: 1000 ug via INTRAMUSCULAR

## 2018-08-14 NOTE — Progress Notes (Signed)
Hospital follow-up visit     CC/Reason for Visit: Hospitalization follow-up  HPI: [name] is a 85 y.o. male who is coming in today for the above mentioned reasons, specifically transitional care services face-to-face visit.    Dates hospitalized: 08/11/2018-08/13/2018 Days since discharge from hospital: 1 Patient was discharged from the hospital to: Home Reason for admission to hospital: Shortness of breath, acute on chronic systolic heart failure Date of interactive phone contact with patient and/or caregiver: 08/14/2018  I have reviewed in detail patient's discharge summary plus pertinent specific notes, labs, and images from the hospitalization.  Yes  Patient was admitted to the hospital on 6/13 due to shortness of breath.  He presented with orthopnea and hypoxemia down to 86%.  In the ED he was requiring 4 L to maintain oxygen saturations greater than 92%.  Chest x-ray with new bilateral opacities concerning for pulmonary edema with some small bilateral effusions.  He was admitted to the hospital given IV Lasix, cardiology was consulted who increased his Lasix to 80 mg twice daily.  When he was discharged on 6/15 his weight was 166, but was 164 the day prior.  His weight today in office is 167.9, he denies shortness of breath.  I have done a diet review with him and he certainly has not been compliant with with a low-sodium diet.  He does not feel short of breath today.  Medication reconciliation was done today and patient is taking meds as recommended by discharging hospitalist/specialist.  Yes   Past Medical/Surgical History: Past Medical History:  Diagnosis Date  . Arthritis    "touch in my fingers" (09/10/2014)  . Atrophic gastritis   . AVM (arteriovenous malformation) of duodenum, acquired   . B12 deficiency anemia    "stopped taking the shots in ~ 06/2014" (09/10/2014)  . Barrett's esophagus   . CAD (coronary artery disease)    a. H/o MI in 1998;  b. s/p CABG  2003. c. 10/2014 NSTEMI/Cath: LM 75, LAD 135m/d, D1 95, D2 50, LCX 100ost/p, OM1 80, OM2 80, RCA 100p/m, RPDA fills via L->L collats, LIMA->LAD ok, VG->dRCA 100, VG->OM1->OM2 99 prox to OM1 insertion->recanalated ->Tx with heparin,  VG->D1 100-->Med Rx.  . Cataract   . Chronic systolic CHF (congestive heart failure) (HCC)    a. EF 40-45% in 10/2014 (previously normal in 08/2014).  . CKD (chronic kidney disease), stage IV ([place])   . Diabetes 1.5, managed as type 1 ([place])   . Hiatal hernia 2018   endoscopy -Dr. Havery Moros  . History of stomach ulcers 2013  . Hyperlipidemia   . Hypertension   . Hypertensive heart disease   . Ischemic cardiomyopathy    a. 10/2014 Echo: EF 40-45%.  . Myocardial infarction ([place])   . PVD (peripheral vascular disease) ([place])   . Type II diabetes mellitus ([place])     Past Surgical History:  Procedure Laterality Date  . CARDIAC CATHETERIZATION  2003;   . CARDIAC CATHETERIZATION N/A 11/07/2014   Left Heart Cath; Belva Crome, MD;   . CATARACT EXTRACTION, BILATERAL Bilateral   . CHOLECYSTECTOMY N/A 05/20/2015   Procedure: LAPAROSCOPIC CHOLECYSTECTOMY;  Surgeon: Coralie Keens, MD;  Location: [facility];  Service: General;  Laterality: N/A;  . COLONOSCOPY    . COLONOSCOPY N/A 05/12/2015   Procedure: COLONOSCOPY;  Surgeon: Milus Banister, MD;  Location: [facility];  Service: Endoscopy;  Laterality: N/A;  . [place]   [name] 07/13/2010  . CORONARY ARTERY BYPASS  GRAFT  2003   CABG X5  . ESOPHAGOGASTRODUODENOSCOPY  06/10/2011   Procedure: ESOPHAGOGASTRODUODENOSCOPY (EGD);  Surgeon: Ladene Artist, MD,FACG;  Location: [facility] ENDOSCOPY;  Service: Endoscopy;  Laterality: N/A;  . ESOPHAGOGASTRODUODENOSCOPY N/A 10/04/2016   Procedure: ESOPHAGOGASTRODUODENOSCOPY (EGD);  Surgeon: Manus Gunning, MD;  Location: [facility];  Service: Gastroenterology;  Laterality: N/A;    Social History:  reports that he has quit smoking. His smoking use included cigarettes.  He has a 10.00 pack-year smoking history. He has never used smokeless tobacco. He reports that he does not drink alcohol or use drugs.  Allergies: No Known Allergies  Family History:  Family History  Problem Relation Age of Onset  . Stroke Mother   . Hypertension Mother   . Cancer Mother   . Diabetes Other        brothers and sisters  . Coronary artery disease Other   . Hypertension Sister   . Heart attack Sister   . Colon cancer Neg Hx      Current Outpatient Medications:  .  ACCU-CHEK SOFTCLIX LANCETS lancets, CHECK BLOOD GLUCOSE THREE TIMES DAILY, Disp: 100 each, Rfl: 0 .  acetaminophen (TYLENOL) 500 MG tablet, Take 500 mg by mouth every 8 (eight) hours as needed for mild pain or headache., Disp: , Rfl:  .  Alcohol Swabs (ALCOHOL PREP) PADS, USE TO TEST BLOOD GLUCOSE THREE TIMES DAILY, Disp: 300 each, Rfl: 3 .  amLODipine (NORVASC) 10 MG tablet, TAKE 1 TABLET EVERY DAY (Patient taking differently: Take 10 mg by mouth daily. ), Disp: 90 tablet, Rfl: 0 .  aspirin EC 81 MG tablet, Take 81 mg by mouth at bedtime., Disp: , Rfl:  .  atorvastatin (LIPITOR) 80 MG tablet, Take 1 tablet (80 mg total) by mouth daily at 6 PM. Pt needs to call and make appt before anymore refills, Disp: 90 tablet, Rfl: 0 .  BD PEN NEEDLE NANO U/F 32G X 4 MM MISC, INJECT TWO TIMES DAILY AS NEEDED., Disp: 180 each, Rfl: 3 .  bisacodyl (DULCOLAX) 5 MG EC tablet, Take 5 mg by mouth daily as needed for moderate constipation., Disp: , Rfl:  .  carvedilol (COREG) 25 MG tablet, TAKE 1 TABLET TWICE DAILY WITH MEALS (Patient taking differently: Take 25 mg by mouth 2 (two) times daily with a meal. ), Disp: 180 tablet, Rfl: 3 .  cloNIDine (CATAPRES) 0.1 MG tablet, Take 1 tablet (0.1 mg total) by mouth at bedtime., Disp: 90 tablet, Rfl: 1 .  Ferrous Sulfate (IRON) 325 (65 Fe) MG TABS, Take 1 tablet by mouth daily. (Patient taking differently: Take 1 tablet by mouth 2 (two) times daily. ), Disp: 30 each, Rfl: 0 .  furosemide  (LASIX) 40 MG tablet, Take 2 tablets (80 mg total) by mouth 2 (two) times daily., Disp: 225 tablet, Rfl: 0 .  glucose blood (ACCU-CHEK AVIVA PLUS) test strip, USE TO TEST BLOOD GLUCOSE THREE TIMES DAILY, Disp: 300 each, Rfl: 3 .  hydrALAZINE (APRESOLINE) 25 MG tablet, TAKE 1 TABLET (25 MG TOTAL) 3 TIMES DAILY. (Patient taking differently: Take 25 mg by mouth 3 (three) times daily. ), Disp: 270 tablet, Rfl: 3 .  insulin aspart (NOVOLOG FLEXPEN) 100 UNIT/ML FlexPen, Inject 14 Units into the skin 3 (three) times daily with meals., Disp: , Rfl:  .  Insulin Glargine (LANTUS SOLOSTAR) 100 UNIT/ML Solostar Pen, Inject 12 Units into the skin daily at 10 pm., Disp: 15 mL, Rfl: 3 .  isosorbide mononitrate (IMDUR) 60 MG 24 hr  tablet, TAKE 2 TABLETS EVERY DAY (Patient taking differently: Take 120 mg by mouth daily. ), Disp: 180 tablet, Rfl: 0 .  Lancet Devices (ACCU-CHEK SOFTCLIX) lancets, USE TO TEST BLOOD GLUCOSE THREE TIMES DAILY, Disp: 1 each, Rfl: 0 .  latanoprost (XALATAN) 0.005 % ophthalmic solution, Place 1 drop into both eyes at bedtime., Disp: , Rfl:  .  nitroGLYCERIN (NITROSTAT) 0.4 MG SL tablet, Place 1 tablet (0.4 mg total) under the tongue every 5 (five) minutes as needed for chest pain (3 doses MAX)., Disp: 75 tablet, Rfl: 1 .  OVER THE COUNTER MEDICATION, Take 1 tablet by mouth daily. Prostate formula, Disp: , Rfl:  .  pentoxifylline (TRENTAL) 400 MG CR tablet, Take 1 tablet (400 mg total) by mouth 2 (two) times daily. (Take at 10 am and 5 pm), Disp: 180 tablet, Rfl: 1 .  polyethylene glycol (MIRALAX / GLYCOLAX) packet, Take 17 g by mouth daily as needed for mild constipation or moderate constipation., Disp: 30 each, Rfl: 1 .  potassium chloride SA (K-DUR) 20 MEQ tablet, Take 1 tablet (20 mEq total) by mouth daily for 7 days., Disp: 7 tablet, Rfl: 0 .  sodium bicarbonate 650 MG tablet, Take 1 tablet (650 mg total) by mouth daily for 30 days., Disp: 30 tablet, Rfl: 0 .  timolol (TIMOPTIC) 0.5 %  ophthalmic solution, Place 1 drop into both eyes every morning., Disp: , Rfl:  .  TRAVATAN Z 0.004 % SOLN ophthalmic solution, Place 1 drop into both eyes every evening., Disp: , Rfl:   Review of Systems:  Constitutional: Denies fever, chills, diaphoresis, appetite change and fatigue.  HEENT: Denies photophobia, eye pain, redness, hearing loss, ear pain, congestion, sore throat, rhinorrhea, sneezing, mouth sores, trouble swallowing, neck pain, neck stiffness and tinnitus.   Respiratory: Denies SOB, DOE, cough, chest tightness,  and wheezing.   Cardiovascular: Denies chest pain, palpitations and leg swelling.  Gastrointestinal: Denies nausea, vomiting, abdominal pain, diarrhea, constipation, blood in stool and abdominal distention.  Genitourinary: Denies dysuria, urgency, frequency, hematuria, flank pain and difficulty urinating.  Endocrine: Denies: hot or cold intolerance, sweats, changes in hair or nails, polyuria, polydipsia. Musculoskeletal: Denies myalgias, back pain, joint swelling, arthralgias and gait problem.  Skin: Denies pallor, rash and wound.  Neurological: Denies dizziness, seizures, syncope, weakness, light-headedness, numbness and headaches.  Hematological: Denies adenopathy. Easy bruising, personal or family bleeding history  Psychiatric/Behavioral: Denies suicidal ideation, mood changes, confusion, nervousness, sleep disturbance and agitation    Physical Exam: Vitals:   08/14/18 1104  BP: 120/60  Pulse: 66  Temp: 98.1 F (36.7 C)  TempSrc: Oral  SpO2: 96%  Weight: 167 lb 14.4 oz (76.2 kg)    Body mass index is 24.09 kg/m.   Constitutional: NAD, calm, comfortable Eyes: PERRL, lids and conjunctivae normal ENMT: Mucous membranes are moist.  Respiratory: Mild bibasilar crackles. Normal respiratory effort. No accessory muscle use.  Cardiovascular: Regular rate and rhythm, no murmurs / rubs / gallops.  1-2+ pitting edema bilaterally of the ankles, 2+ pedal pulses. No  carotid bruits.  Abdomen: no tenderness, no masses palpated. No hepatosplenomegaly. Bowel sounds positive.  Musculoskeletal: no clubbing / cyanosis. No joint deformity upper and lower extremities. Good ROM, no contractures. Normal muscle tone.  Skin: no rashes, lesions, ulcers. No induration Psychiatric: Normal judgment and insight. Alert and oriented x 3. Normal mood.    Impression and Plan:  Hospital discharge follow-up  Acute on chronic combined systolic and diastolic congestive heart failure ([place])   -He symptomatically  is doing well post discharge, is no longer short of breath, he states that despite having lower extremity edema, it is unchanged from hospital discharge. -I am concerned in the fact that he has gained 2 pounds in less than 24 hours despite increasing Lasix dosage. -He will take an extra 40 mg of Lasix for the next 3 days.  Will take daily weights and notify us with significant weight gains. -This is definitely due to diet indiscretion, I have reviewed appropriate low-sodium diet with him today and have provided a handout with information.  We have also discussed 1.5 L fluid restriction. -To soon after DC to recheck renal function. -He should also have close cardiology follow-up post discharge, have advised patient to contact cardiology office for visit. -Above has been communicated via phone to patient's son, [name]  B12 deficiency -He is wanting to get his B12 injection in office today.     Medical decision making of high complexity was utilized today.  Patient Instructions  -Nice seeing you today!!  -Keep taking furosemide 2 tablets in the morning and 2 tablets in the evening. For the next 3 days please add another tablet at noon.   -It is very important that you follow a low-salt diet. Will provide some information below.  -Make sure you schedule follow up appointments with the heart and kidney doctors as recommended during your hospital stay.  -Schedule lab  appointment in 1 week.  -Reschedule your wellness visit at your convenience.   DASH Eating Plan DASH stands for "Dietary Approaches to Stop Hypertension." The DASH eating plan is a healthy eating plan that has been shown to reduce high blood pressure (hypertension). It may also reduce your risk for type 2 diabetes, heart disease, and stroke. The DASH eating plan may also help with weight loss. What are tips for following this plan?  General guidelines  Avoid eating more than 2,300 mg (milligrams) of salt (sodium) a day. If you have hypertension, you may need to reduce your sodium intake to 1,500 mg a day.  Limit alcohol intake to no more than 1 drink a day for nonpregnant women and 2 drinks a day for men. One drink equals 12 oz of beer, 5 oz of wine, or 1 oz of hard liquor.  Work with your health care provider to maintain a healthy body weight or to lose weight. Ask what an ideal weight is for you.  Get at least 30 minutes of exercise that causes your heart to beat faster (aerobic exercise) most days of the week. Activities may include walking, swimming, or biking.  Work with your health care provider or diet and nutrition specialist (dietitian) to adjust your eating plan to your individual calorie needs. Reading food labels   Check food labels for the amount of sodium per serving. Choose foods with less than 5 percent of the Daily Value of sodium. Generally, foods with less than 300 mg of sodium per serving fit into this eating plan.  To find whole grains, look for the word "whole" as the first word in the ingredient list. Shopping  Buy products labeled as "low-sodium" or "no salt added."  Buy fresh foods. Avoid canned foods and premade or frozen meals. Cooking  Avoid adding salt when cooking. Use salt-free seasonings or herbs instead of table salt or sea salt. Check with your health care provider or pharmacist before using salt substitutes.  Do not fry foods. Cook foods using  healthy methods such as baking, boiling, grilling, and broiling  instead.  Cook with heart-healthy oils, such as olive, canola, soybean, or sunflower oil. Meal planning  Eat a balanced diet that includes: ? 5 or more servings of fruits and vegetables each day. At each meal, try to fill half of your plate with fruits and vegetables. ? Up to 6-8 servings of whole grains each day. ? Less than 6 oz of lean meat, poultry, or fish each day. A 3-oz serving of meat is about the same size as a deck of cards. One egg equals 1 oz. ? 2 servings of low-fat dairy each day. ? A serving of nuts, seeds, or beans 5 times each week. ? Heart-healthy fats. Healthy fats called Omega-3 fatty acids are found in foods such as flaxseeds and coldwater fish, like sardines, salmon, and mackerel.  Limit how much you eat of the following: ? Canned or prepackaged foods. ? Food that is high in trans fat, such as fried foods. ? Food that is high in saturated fat, such as fatty meat. ? Sweets, desserts, sugary drinks, and other foods with added sugar. ? Full-fat dairy products.  Do not salt foods before eating.  Try to eat at least 2 vegetarian meals each week.  Eat more home-cooked food and less restaurant, buffet, and fast food.  When eating at a restaurant, ask that your food be prepared with less salt or no salt, if possible. What foods are recommended? The items listed may not be a complete list. Talk with your dietitian about what dietary choices are best for you. Grains Whole-grain or whole-wheat bread. Whole-grain or whole-wheat pasta. Brown rice. [place]. Bulgur. Whole-grain and low-sodium cereals. Pita bread. Low-fat, low-sodium crackers. Whole-wheat flour tortillas. Vegetables Fresh or frozen vegetables (raw, steamed, roasted, or grilled). Low-sodium or reduced-sodium tomato and vegetable juice. Low-sodium or reduced-sodium tomato sauce and tomato paste. Low-sodium or reduced-sodium canned vegetables.  Fruits All fresh, dried, or frozen fruit. Canned fruit in natural juice (without added sugar). Meat and other protein foods Skinless chicken or [place]. Ground chicken or [place]. Pork with fat trimmed off. Fish and seafood. Egg whites. Dried beans, peas, or lentils. Unsalted nuts, nut butters, and seeds. Unsalted canned beans. Lean cuts of beef with fat trimmed off. Low-sodium, lean deli meat. Dairy Low-fat (1%) or fat-free (skim) milk. Fat-free, low-fat, or reduced-fat cheeses. Nonfat, low-sodium ricotta or cottage cheese. Low-fat or nonfat yogurt. Low-fat, low-sodium cheese. Fats and oils Soft margarine without trans fats. Vegetable oil. Low-fat, reduced-fat, or light mayonnaise and salad dressings (reduced-sodium). Canola, safflower, olive, soybean, and sunflower oils. Avocado. Seasoning and other foods Herbs. Spices. Seasoning mixes without salt. Unsalted popcorn and pretzels. Fat-free sweets. What foods are not recommended? The items listed may not be a complete list. Talk with your dietitian about what dietary choices are best for you. Grains Baked goods made with fat, such as croissants, muffins, or some breads. Dry pasta or rice meal packs. Vegetables Creamed or fried vegetables. Vegetables in a cheese sauce. Regular canned vegetables (not low-sodium or reduced-sodium). Regular canned tomato sauce and paste (not low-sodium or reduced-sodium). Regular tomato and vegetable juice (not low-sodium or reduced-sodium). Angie Fava. Olives. Fruits Canned fruit in a light or heavy syrup. Fried fruit. Fruit in cream or butter sauce. Meat and other protein foods Fatty cuts of meat. Ribs. Fried meat. Berniece Salines. Sausage. Bologna and other processed lunch meats. Salami. Fatback. Hotdogs. Bratwurst. Salted nuts and seeds. Canned beans with added salt. Canned or smoked fish. Whole eggs or egg yolks. Chicken or [place] with skin. Dairy  Whole or 2% milk, cream, and half-and-half. Whole or full-fat cream cheese.  Whole-fat or sweetened yogurt. Full-fat cheese. Nondairy creamers. Whipped toppings. Processed cheese and cheese spreads. Fats and oils Butter. Stick margarine. Lard. Shortening. Ghee. Bacon fat. Tropical oils, such as coconut, palm kernel, or palm oil. Seasoning and other foods Salted popcorn and pretzels. Onion salt, garlic salt, seasoned salt, table salt, and sea salt. Worcestershire sauce. Tartar sauce. Barbecue sauce. Teriyaki sauce. Soy sauce, including reduced-sodium. Steak sauce. Canned and packaged gravies. Fish sauce. Oyster sauce. Cocktail sauce. Horseradish that you find on the shelf. Ketchup. Mustard. Meat flavorings and tenderizers. Bouillon cubes. Hot sauce and Tabasco sauce. Premade or packaged marinades. Premade or packaged taco seasonings. Relishes. Regular salad dressings. Where to find more information:  National Heart, Lung, and [place]: https://wilson-eaton.com/  American Heart Association: www.heart.org Summary  The DASH eating plan is a healthy eating plan that has been shown to reduce high blood pressure (hypertension). It may also reduce your risk for type 2 diabetes, heart disease, and stroke.  With the DASH eating plan, you should limit salt (sodium) intake to 2,300 mg a day. If you have hypertension, you may need to reduce your sodium intake to 1,500 mg a day.  When on the DASH eating plan, aim to eat more fresh fruits and vegetables, whole grains, lean proteins, low-fat dairy, and heart-healthy fats.  Work with your health care provider or diet and nutrition specialist (dietitian) to adjust your eating plan to your individual calorie needs. This information is not intended to replace advice given to you by your health care provider. Make sure you discuss any questions you have with your health care provider. Document Released: 02/03/2011 Document Revised: 02/08/2016 Document Reviewed: 02/08/2016 Elsevier Interactive Patient Education  2019 [employer].       Lelon Frohlich, MD [place] [facility]

## 2018-08-14 NOTE — Telephone Encounter (Signed)
Transition Care Management Follow-up Telephone Call   Date discharged? 08/13/2018   How have you been since you were released from the hospital? Good    Do you understand why you were in the hospital? yes   Do you understand the discharge instructions? yes   Where were you discharged to? home   Items Reviewed:  Medications reviewed: yes - started on potassium  Allergies reviewed: yes  Dietary changes reviewed: yes  Referrals reviewed: yes   Functional Questionnaire:   Activities of Daily Living (ADLs):   He states they are independent in the following: other States they require assistance with the following: none   Any transportation issues/concerns?: no   Any patient concerns? no   Confirmed importance and date/time of follow-up visits scheduled yes  Provider Appointment booked with Dr Jerilee Hoh  Confirmed with patient if condition begins to worsen call PCP or go to the ER.  Patient was given the office number and encouraged to call back with question or concerns.  : yes

## 2018-08-14 NOTE — Patient Instructions (Addendum)
-Nice seeing you today!!  -Keep taking furosemide 2 tablets in the morning and 2 tablets in the evening. For the next 3 days please add another tablet at noon.   -It is very important that you follow a low-salt diet. Will provide some information below.  -Make sure you schedule follow up appointments with the heart and kidney doctors as recommended during your hospital stay.  -Schedule lab appointment in 1 week.  -Reschedule your wellness visit at your convenience.   DASH Eating Plan DASH stands for "Dietary Approaches to Stop Hypertension." The DASH eating plan is a healthy eating plan that has been shown to reduce high blood pressure (hypertension). It may also reduce your risk for type 2 diabetes, heart disease, and stroke. The DASH eating plan may also help with weight loss. What are tips for following this plan?  General guidelines  Avoid eating more than 2,300 mg (milligrams) of salt (sodium) a day. If you have hypertension, you may need to reduce your sodium intake to 1,500 mg a day.  Limit alcohol intake to no more than 1 drink a day for nonpregnant women and 2 drinks a day for men. One drink equals 12 oz of beer, 5 oz of wine, or 1 oz of hard liquor.  Work with your health care provider to maintain a healthy body weight or to lose weight. Ask what an ideal weight is for you.  Get at least 30 minutes of exercise that causes your heart to beat faster (aerobic exercise) most days of the week. Activities may include walking, swimming, or biking.  Work with your health care provider or diet and nutrition specialist (dietitian) to adjust your eating plan to your individual calorie needs. Reading food labels   Check food labels for the amount of sodium per serving. Choose foods with less than 5 percent of the Daily Value of sodium. Generally, foods with less than 300 mg of sodium per serving fit into this eating plan.  To find whole grains, look for the word "whole" as the first  word in the ingredient list. Shopping  Buy products labeled as "low-sodium" or "no salt added."  Buy fresh foods. Avoid canned foods and premade or frozen meals. Cooking  Avoid adding salt when cooking. Use salt-free seasonings or herbs instead of table salt or sea salt. Check with your health care provider or pharmacist before using salt substitutes.  Do not fry foods. Cook foods using healthy methods such as baking, boiling, grilling, and broiling instead.  Cook with heart-healthy oils, such as olive, canola, soybean, or sunflower oil. Meal planning  Eat a balanced diet that includes: ? 5 or more servings of fruits and vegetables each day. At each meal, try to fill half of your plate with fruits and vegetables. ? Up to 6-8 servings of whole grains each day. ? Less than 6 oz of lean meat, poultry, or fish each day. A 3-oz serving of meat is about the same size as a deck of cards. One egg equals 1 oz. ? 2 servings of low-fat dairy each day. ? A serving of nuts, seeds, or beans 5 times each week. ? Heart-healthy fats. Healthy fats called Omega-3 fatty acids are found in foods such as flaxseeds and coldwater fish, like sardines, salmon, and mackerel.  Limit how much you eat of the following: ? Canned or prepackaged foods. ? Food that is high in trans fat, such as fried foods. ? Food that is high in saturated fat, such as fatty  meat. ? Sweets, desserts, sugary drinks, and other foods with added sugar. ? Full-fat dairy products.  Do not salt foods before eating.  Try to eat at least 2 vegetarian meals each week.  Eat more home-cooked food and less restaurant, buffet, and fast food.  When eating at a restaurant, ask that your food be prepared with less salt or no salt, if possible. What foods are recommended? The items listed may not be a complete list. Talk with your dietitian about what dietary choices are best for you. Grains Whole-grain or whole-wheat bread. Whole-grain or  whole-wheat pasta. Brown rice. [place]. Bulgur. Whole-grain and low-sodium cereals. Pita bread. Low-fat, low-sodium crackers. Whole-wheat flour tortillas. Vegetables Fresh or frozen vegetables (raw, steamed, roasted, or grilled). Low-sodium or reduced-sodium tomato and vegetable juice. Low-sodium or reduced-sodium tomato sauce and tomato paste. Low-sodium or reduced-sodium canned vegetables. Fruits All fresh, dried, or frozen fruit. Canned fruit in natural juice (without added sugar). Meat and other protein foods Skinless chicken or [place]. Ground chicken or [place]. Pork with fat trimmed off. Fish and seafood. Egg whites. Dried beans, peas, or lentils. Unsalted nuts, nut butters, and seeds. Unsalted canned beans. Lean cuts of beef with fat trimmed off. Low-sodium, lean deli meat. Dairy Low-fat (1%) or fat-free (skim) milk. Fat-free, low-fat, or reduced-fat cheeses. Nonfat, low-sodium ricotta or cottage cheese. Low-fat or nonfat yogurt. Low-fat, low-sodium cheese. Fats and oils Soft margarine without trans fats. Vegetable oil. Low-fat, reduced-fat, or light mayonnaise and salad dressings (reduced-sodium). Canola, safflower, olive, soybean, and sunflower oils. Avocado. Seasoning and other foods Herbs. Spices. Seasoning mixes without salt. Unsalted popcorn and pretzels. Fat-free sweets. What foods are not recommended? The items listed may not be a complete list. Talk with your dietitian about what dietary choices are best for you. Grains Baked goods made with fat, such as croissants, muffins, or some breads. Dry pasta or rice meal packs. Vegetables Creamed or fried vegetables. Vegetables in a cheese sauce. Regular canned vegetables (not low-sodium or reduced-sodium). Regular canned tomato sauce and paste (not low-sodium or reduced-sodium). Regular tomato and vegetable juice (not low-sodium or reduced-sodium). Angie Fava. Olives. Fruits Canned fruit in a light or heavy syrup. Fried fruit. Fruit  in cream or butter sauce. Meat and other protein foods Fatty cuts of meat. Ribs. Fried meat. Berniece Salines. Sausage. Bologna and other processed lunch meats. Salami. Fatback. Hotdogs. Bratwurst. Salted nuts and seeds. Canned beans with added salt. Canned or smoked fish. Whole eggs or egg yolks. Chicken or [place] with skin. Dairy Whole or 2% milk, cream, and half-and-half. Whole or full-fat cream cheese. Whole-fat or sweetened yogurt. Full-fat cheese. Nondairy creamers. Whipped toppings. Processed cheese and cheese spreads. Fats and oils Butter. Stick margarine. Lard. Shortening. Ghee. Bacon fat. Tropical oils, such as coconut, palm kernel, or palm oil. Seasoning and other foods Salted popcorn and pretzels. Onion salt, garlic salt, seasoned salt, table salt, and sea salt. Worcestershire sauce. Tartar sauce. Barbecue sauce. Teriyaki sauce. Soy sauce, including reduced-sodium. Steak sauce. Canned and packaged gravies. Fish sauce. Oyster sauce. Cocktail sauce. Horseradish that you find on the shelf. Ketchup. Mustard. Meat flavorings and tenderizers. Bouillon cubes. Hot sauce and Tabasco sauce. Premade or packaged marinades. Premade or packaged taco seasonings. Relishes. Regular salad dressings. Where to find more information:  National Heart, Lung, and [place]: https://wilson-eaton.com/  American Heart Association: www.heart.org Summary  The DASH eating plan is a healthy eating plan that has been shown to reduce high blood pressure (hypertension). It may also reduce your risk for type  2 diabetes, heart disease, and stroke.  With the DASH eating plan, you should limit salt (sodium) intake to 2,300 mg a day. If you have hypertension, you may need to reduce your sodium intake to 1,500 mg a day.  When on the DASH eating plan, aim to eat more fresh fruits and vegetables, whole grains, lean proteins, low-fat dairy, and heart-healthy fats.  Work with your health care provider or diet and nutrition specialist  (dietitian) to adjust your eating plan to your individual calorie needs. This information is not intended to replace advice given to you by your health care provider. Make sure you discuss any questions you have with your health care provider. Document Released: 02/03/2011 Document Revised: 02/08/2016 Document Reviewed: 02/08/2016 Elsevier Interactive Patient Education  2019 [employer].

## 2018-08-15 ENCOUNTER — Telehealth: Payer: Self-pay | Admitting: Interventional Cardiology

## 2018-08-15 NOTE — Telephone Encounter (Signed)
Spoke with pt and changed appt for 6/22 to in person.  Pt appreciative for call.

## 2018-08-15 NOTE — Telephone Encounter (Signed)
New Message ° ° ° °Pt is returning call  ° ° ° °Please call back  °

## 2018-08-17 NOTE — Telephone Encounter (Signed)

## 2018-08-19 NOTE — Progress Notes (Signed)
Cardiology Office Note:    Date:  08/20/2018   ID:  [name], DOB 10/19/1931, MRN [id number]  PCP:  [name], [name], MD  Cardiologist:  [name], MD   Referring MD: [name], [name]*  Chief Complaint  Patient presents with  . Coronary Artery Disease  . Congestive Heart Failure  . Chronic Kidney Disease    History of Present Illness:    [name] is a 85 y.o. male with a hx of CAD, bypass graft failure with only remaining conduit LIMA to LAD by cath in September 2016, chronic kidney disease stage IV, diabetes mellitus II, chronic systolic heart failure with LVEF 35-40%, peripheral arterial disease, hypertension, stomach ulcers (with GI bleeding while on Plavix), and hyperlipidemia.  Recent hospitalized with A/C combined systolic and diastolic HF. Recommended 80 mg BID furosemide at DC. However, sent home on 80 mg AM and 40 mg PM which was his prior baseline dose. PCP increased to 100 mg BID for 3 days 2 days after discharge from the hospital. Now back on 80 mg AM and 40 mg PM.  Says his breathing is better now than it was when he got home from the hospital.  Still feels his breathing is not as good as it could be.  He denies angina pectoris.  Past Medical History:  Diagnosis Date  . Arthritis    "touch in my fingers" (09/10/2014)  . Atrophic gastritis   . AVM (arteriovenous malformation) of duodenum, acquired   . B12 deficiency anemia    "stopped taking the shots in ~ 06/2014" (09/10/2014)  . Barrett's esophagus   . CAD (coronary artery disease)    a. H/o MI in 1998;  b. s/p CABG 2003. c. 10/2014 NSTEMI/Cath: LM 75, LAD 159m/d, D1 95, D2 50, LCX 100ost/p, OM1 80, OM2 80, RCA 100p/m, RPDA fills via L->L collats, LIMA->LAD ok, VG->dRCA 100, VG->OM1->OM2 99 prox to OM1 insertion->recanalated ->Tx with heparin,  VG->D1 100-->Med Rx.  . Cataract   . Chronic systolic CHF (congestive heart failure) (HCC)    a. EF 40-45% in 10/2014 (previously  normal in 08/2014).  . CKD (chronic kidney disease), stage IV ([place])   . Diabetes 1.5, managed as type 1 ([place])   . Hiatal hernia 2018   endoscopy -Dr. Havery Moros  . History of stomach ulcers 2013  . Hyperlipidemia   . Hypertension   . Hypertensive heart disease   . Ischemic cardiomyopathy    a. 10/2014 Echo: EF 40-45%.  . Myocardial infarction ([place])   . PVD (peripheral vascular disease) ([place])   . Type II diabetes mellitus ([place])     Past Surgical History:  Procedure Laterality Date  . CARDIAC CATHETERIZATION  2003;   . CARDIAC CATHETERIZATION N/A 11/07/2014   Left Heart Cath; Belva Crome, MD;   . CATARACT EXTRACTION, BILATERAL Bilateral   . CHOLECYSTECTOMY N/A 05/20/2015   Procedure: LAPAROSCOPIC CHOLECYSTECTOMY;  Surgeon: Coralie Keens, MD;  Location: [place];  Service: General;  Laterality: N/A;  . COLONOSCOPY    . COLONOSCOPY N/A 05/12/2015   Procedure: COLONOSCOPY;  Surgeon: Milus Banister, MD;  Location: [place];  Service: Endoscopy;  Laterality: N/A;  . [place]   [name] 07/13/2010  . CORONARY ARTERY BYPASS GRAFT  2003   CABG X5  . ESOPHAGOGASTRODUODENOSCOPY  06/10/2011   Procedure: ESOPHAGOGASTRODUODENOSCOPY (EGD);  Surgeon: Ladene Artist, MD,FACG;  [facility] END[facility]COPY;  Service: Endoscopy;  Laterality: N/A;  . ESOPHAGOGASTRODUODENOSCOPY N/A 10/04/2016  Procedure: ESOPHAGOGASTRODUODENOSCOPY (EGD);  Surgeon: Manus Gunning, MD;  Location: [facility] END[facility];  Service: Gastroenterology;  Laterality: N/A;    Current Medications: Current Meds  Medication Sig  . ACCU-CHEK SOFTCLIX LANCETS lancets CHECK BLOOD GLUCOSE THREE TIMES DAILY  . acetaminophen (TYLENOL) 500 MG tablet Take 500 mg by mouth every 8 (eight) hours as needed for mild pain or headache.  . Alcohol Swabs (ALCOHOL PREP) PADS USE TO TEST BLOOD GLUCOSE THREE TIMES DAILY  . amLODipine (NORVASC) 10 MG tablet TAKE 1 TABLET EVERY DAY  . aspirin EC 81 MG tablet Take 81 mg by mouth at  bedtime.  Marland Kitchen atorvastatin (LIPITOR) 80 MG tablet Take 1 tablet (80 mg total) by mouth daily at 6 PM. Pt needs to call and make appt before anymore refills  . BD PEN NEEDLE NANO U/F 32G X 4 MM MISC INJECT TWO TIMES DAILY AS NEEDED.  . bisacodyl (DULCOLAX) 5 MG EC tablet Take 5 mg by mouth daily as needed for moderate constipation.  . carvedilol (COREG) 25 MG tablet TAKE 1 TABLET TWICE DAILY WITH MEALS  . cloNIDine (CATAPRES) 0.1 MG tablet Take 1 tablet (0.1 mg total) by mouth at bedtime.  . Ferrous Sulfate (IRON) 325 (65 Fe) MG TABS Take 1 tablet by mouth daily.  . furosemide (LASIX) 40 MG tablet Take 2 tablets (80 mg total) by mouth 2 (two) times daily.  Marland Kitchen glucose blood (ACCU-CHEK AVIVA PLUS) test strip USE TO TEST BLOOD GLUCOSE THREE TIMES DAILY  . hydrALAZINE (APRESOLINE) 25 MG tablet TAKE 1 TABLET (25 MG TOTAL) 3 TIMES DAILY.  Marland Kitchen insulin aspart (NOVOLOG FLEXPEN) 100 UNIT/ML FlexPen Inject 14 Units into the skin 3 (three) times daily with meals.  . Insulin Glargine (LANTUS SOLOSTAR) 100 UNIT/ML Solostar Pen Inject 12 Units into the skin daily at 10 pm.  . isosorbide mononitrate (IMDUR) 60 MG 24 hr tablet TAKE 2 TABLETS EVERY DAY  . Lancet Devices (ACCU-CHEK SOFTCLIX) lancets USE TO TEST BLOOD GLUCOSE THREE TIMES DAILY  . latanoprost (XALATAN) 0.005 % ophthalmic solution Place 1 drop into both eyes at bedtime.  . nitroGLYCERIN (NITROSTAT) 0.4 MG SL tablet Place 1 tablet (0.4 mg total) under the tongue every 5 (five) minutes as needed for chest pain (3 doses MAX).  Marland Kitchen OVER THE COUNTER MEDICATION Take 1 tablet by mouth daily. Prostate formula  . pentoxifylline (TRENTAL) 400 MG CR tablet Take 1 tablet (400 mg total) by mouth 2 (two) times daily. (Take at 10 am and 5 pm)  . potassium chloride SA (K-DUR) 20 MEQ tablet Take 1 tablet (20 mEq total) by mouth daily for 7 days.  Marland Kitchen timolol (TIMOPTIC) 0.5 % ophthalmic solution Place 1 drop into both eyes every morning.  . TRAVATAN Z 0.004 % SOLN ophthalmic  solution Place 1 drop into both eyes every evening.     Allergies:   Patient has no known allergies.   Social History   Socioeconomic History  . Marital status: Widowed    Spouse name: Not on file  . Number of children: Not on file  . Years of education: Not on file  . Highest education level: Not on file  Occupational History  . Occupation: Retired  Scientific laboratory technician  . Financial resource strain: Not on file  . Food insecurity    Worry: Not on file    Inability: Not on file  . Transportation needs    Medical: Not on file    Non-medical: Not on file  Tobacco Use  . Smoking  status: Former Smoker    Packs/day: 0.50    Years: 20.00    Pack years: 10.00    Types: Cigarettes  . Smokeless tobacco: Never Used  . Tobacco comment: "quit smoking cigarettes in the 1960s   Substance and Sexual Activity  . Alcohol use: No  . Drug use: No  . Sexual activity: Not Currently  Lifestyle  . Physical activity    Days per week: Not on file    Minutes per session: Not on file  . Stress: Not on file  Relationships  . Social Herbalist on phone: Not on file    Gets together: Not on file    Attends religious service: Not on file    Active member of club or organization: Not on file    Attends meetings of clubs or organizations: Not on file    Relationship status: Not on file  Other Topics Concern  . Not on file  Social History Narrative   Pt lives alone, family nearby.     Family History: The patient's family history includes Cancer in his mother; Coronary artery disease in an other family member; Diabetes in an other family member; Heart attack in his sister; Hypertension in his mother and sister; Stroke in his mother. There is no history of Colon cancer.  ROS:   Please see the history of present illness.    Otherwise doing okay.  Feels a little dizzy or lightheaded if she stands quickly.  Has an upcoming appointment with Dr. Joelyn Oms, his nephrologist.  All other systems  reviewed and are negative.  EKGs/Labs/Other Studies Reviewed:    The following studies were reviewed today:  2D Doppler echocardiogram during this recent hospital stay June 2020: IMPRESSIONS    1. The left ventricle has moderate-severely reduced systolic function, with an ejection fraction of 30-35%. The cavity size was mildly dilated. Left ventricular diastolic Doppler parameters are consistent with restrictive filling. There is akinesis of  the basal to mid inferior and inferolateral walls and the basal anterolateral wall.  2. There is mild mitral annular calcification present. No evidence of mitral valve stenosis. Mild mitral regurgitation.  3. The aortic valve is tricuspid. Mild calcification of the aortic valve. Aortic valve regurgitation is trivial by color flow Doppler. No stenosis of the aortic valve.  4. The aortic root is normal in size and structure.  5. Normal RV size with mildly decreased systolic function.  6. The inferior vena cava was dilated in size with <50% respiratory variability. PA systolic pressure 57 mmHg.  EKG:  EKG is not repeated at this time.  Several tracings were performed during the recent hospital stay.  Recent Labs: 08/11/2018: ALT 20; B Natriuretic Peptide 969.0 08/12/2018: Hemoglobin 11.8; Platelets 188 08/13/2018: BUN 64; Creatinine, Ser 4.15; Potassium 3.3; Sodium 137  Recent Lipid Panel    Component Value Date/Time   CHOL 103 08/02/2017 1352   TRIG 171.0 (H) 08/02/2017 1352   HDL 23.90 (L) 08/02/2017 1352   CHOLHDL 4 08/02/2017 1352   VLDL 34.2 08/02/2017 1352   LDLCALC 45 08/02/2017 1352   LDLDIRECT 93.3 01/22/2013 0854    Physical Exam:    VS:  BP (!) 136/58   Pulse 65   Ht 5\' 10"  (1.778 m)   Wt 163 lb 9.6 oz (74.2 kg)   SpO2 97%   BMI 23.47 kg/m     Wt Readings from Last 3 Encounters:  08/20/18 163 lb 9.6 oz (74.2 kg)  08/14/18 167 lb  14.4 oz (76.2 kg)  08/13/18 166 lb 3.2 oz (75.4 kg)     GEN: Appears younger than stated  age. No acute distress HEENT: Normal NECK: No JVD. LYMPHATICS: No lymphadenopathy CARDIAC: RRR.  2/6 apical systolic murmur, S4 gallop, no edema VASCULAR: 2+ bilateral radial pulses, no bruits RESPIRATORY:  Clear to auscultation without rales, wheezing or rhonchi  ABDOMEN: Soft, non-tender, non-distended, No pulsatile mass, MUSCULOSKELETAL: No deformity  SKIN: Warm and dry NEUROLOGIC:  Alert and oriented x 3 PSYCHIATRIC:  Normal affect   ASSESSMENT:    1. Coronary artery disease involving 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 ([place])   2. Chronic combined systolic and diastolic heart failure ([place])   3. CKD (chronic kidney disease), stage IV ([place])   4. Essential hypertension   5. Diabetes mellitus due to underlying condition with diabetic nephropathy, with long-term current use of insulin ([place])   6. Dyslipidemia   7. Educated About Covid-19 Virus Infection    PLAN:    In order of problems listed above:  1. Stable coronary disease although it is highly likely that chronic ischemia is contributing to diastolic component of heart failure.  He is on a good medical regimen from anti-ischemic standpoint. 2. Increase furosemide to 80 mg twice daily.  Basic metabolic panel is obtained today.  He has a 3-week appointment coming up with Dr. Joelyn Oms. 3. Stage IV/V CKD contributing to volume overload.  Have increased diuretic regimen to 80 mg p.o. twice daily 4. Adequate blood pressure control 5. Did not discuss diabetes on this visit.  Clinical follow-up in 3 months.  Earlier if volume overload.  Will discuss labs after they are returned.   Medication Adjustments/Labs and Tests Ordered: Current medicines are reviewed at length with the patient today.  Concerns regarding medicines are outlined above.  No orders of the defined types were placed in this encounter.  No orders of the defined types were placed in this encounter.   There are no Patient Instructions on file  for this visit.   Signed, [name], MD  08/20/2018 4:33 PM    [place]

## 2018-08-20 ENCOUNTER — Ambulatory Visit (INDEPENDENT_AMBULATORY_CARE_PROVIDER_SITE_OTHER): Payer: Medicare HMO | Admitting: Interventional Cardiology

## 2018-08-20 ENCOUNTER — Other Ambulatory Visit: Payer: Self-pay

## 2018-08-20 ENCOUNTER — Encounter: Payer: Self-pay | Admitting: Interventional Cardiology

## 2018-08-20 VITALS — BP 136/58 | HR 65 | Ht 70.0 in | Wt 163.6 lb

## 2018-08-20 DIAGNOSIS — I5042 Chronic combined systolic (congestive) and diastolic (congestive) heart failure: Secondary | ICD-10-CM | POA: Diagnosis not present

## 2018-08-20 DIAGNOSIS — N184 Chronic kidney disease, stage 4 (severe): Secondary | ICD-10-CM | POA: Diagnosis not present

## 2018-08-20 DIAGNOSIS — Z7189 Other specified counseling: Secondary | ICD-10-CM

## 2018-08-20 DIAGNOSIS — I10 Essential (primary) hypertension: Secondary | ICD-10-CM

## 2018-08-20 DIAGNOSIS — Z794 Long term (current) use of insulin: Secondary | ICD-10-CM | POA: Diagnosis not present

## 2018-08-20 DIAGNOSIS — I25709 Atherosclerosis of coronary artery bypass graft(s), unspecified, with unspecified angina pectoris: Secondary | ICD-10-CM | POA: Diagnosis not present

## 2018-08-20 DIAGNOSIS — E0821 Diabetes mellitus due to underlying condition with diabetic nephropathy: Secondary | ICD-10-CM

## 2018-08-20 DIAGNOSIS — E785 Hyperlipidemia, unspecified: Secondary | ICD-10-CM

## 2018-08-20 MED ORDER — FUROSEMIDE 80 MG PO TABS: 80.0000 mg | ORAL_TABLET | Freq: Every day | ORAL | 3 refills | Status: DC

## 2018-08-20 NOTE — Patient Instructions (Signed)
Medication Instructions:  1) INCREASE Furosemide to 80mg  twice daily  If you need a refill on your cardiac medications before your next appointment, please call your pharmacy.   Lab work: BMET today If you have labs (blood work) drawn today and your tests are completely normal, you will receive your results only by: Marland Kitchen MyChart Message (if you have MyChart) OR . A paper copy in the mail If you have any lab test that is abnormal or we need to change your treatment, we will call you to review the results.  Testing/Procedures: None  Follow-Up: At [facility], you and your health needs are our priority.  As part of our continuing mission to provide you with exceptional heart care, we have created designated Provider Care Teams.  These Care Teams include your primary Cardiologist (physician) and Advanced Practice Providers (APPs -  Physician Assistants and Nurse Practitioners) who all work together to provide you with the care you need, when you need it. You will need a follow up appointment in 3 months.  Please call our office 2 months in advance to schedule this appointment.  You may see [name], MD or one of the following Advanced Practice Providers on your designated Care Team:   Truitt Merle, NP Cecilie Kicks, NP . Kathyrn Drown, NP  Any Other Special Instructions Will Be Listed Below (If Applicable).

## 2018-08-21 LAB — BASIC METABOLIC PANEL
BUN/Creatinine Ratio: 18 (ref 10–24)
BUN: 71 mg/dL — ABNORMAL HIGH (ref 8–27)
CO2: 19 mmol/L — ABNORMAL LOW (ref 20–29)
Calcium: 9.4 mg/dL (ref 8.6–10.2)
Chloride: 103 mmol/L (ref 96–106)
Creatinine, Ser: 3.85 mg/dL — ABNORMAL HIGH (ref 0.76–1.27)
GFR calc Af Amer: 15 mL/min/{1.73_m2} — ABNORMAL LOW (ref >59)
GFR calc non Af Amer: 13 mL/min/{1.73_m2} — ABNORMAL LOW (ref >59)
Glucose: 135 mg/dL — ABNORMAL HIGH (ref 65–99)
Potassium: 4.6 mmol/L (ref 3.5–5.2)
Sodium: 139 mmol/L (ref 134–144)

## 2018-08-22 ENCOUNTER — Other Ambulatory Visit: Payer: Self-pay

## 2018-08-22 ENCOUNTER — Other Ambulatory Visit (INDEPENDENT_AMBULATORY_CARE_PROVIDER_SITE_OTHER): Payer: Medicare HMO

## 2018-08-22 DIAGNOSIS — D649 Anemia, unspecified: Secondary | ICD-10-CM | POA: Diagnosis not present

## 2018-08-22 LAB — CBC WITH DIFFERENTIAL/PLATELET
Basophils Absolute: 0.1 K/uL (ref 0.0–0.1)
Basophils Relative: 0.9 % (ref 0.0–3.0)
Eosinophils Absolute: 0.3 K/uL (ref 0.0–0.7)
Eosinophils Relative: 4.8 % (ref 0.0–5.0)
HCT: 32.9 % — ABNORMAL LOW (ref 39.0–52.0)
Hemoglobin: 11.1 g/dL — ABNORMAL LOW (ref 13.0–17.0)
Lymphocytes Relative: 12.4 % (ref 12.0–46.0)
Lymphs Abs: 0.7 K/uL (ref 0.7–4.0)
MCHC: 33.8 g/dL (ref 30.0–36.0)
MCV: 94.6 fl (ref 78.0–100.0)
Monocytes Absolute: 0.4 K/uL (ref 0.1–1.0)
Monocytes Relative: 6.9 % (ref 3.0–12.0)
Neutro Abs: 4.5 K/uL (ref 1.4–7.7)
Neutrophils Relative %: 75 % (ref 43.0–77.0)
Platelets: 238 K/uL (ref 150.0–400.0)
RBC: 3.48 Mil/uL — ABNORMAL LOW (ref 4.22–5.81)
RDW: 15.3 % (ref 11.5–15.5)
WBC: 6 K/uL (ref 4.0–10.5)

## 2018-08-23 ENCOUNTER — Telehealth: Payer: Self-pay | Admitting: Interventional Cardiology

## 2018-08-23 NOTE — Telephone Encounter (Signed)
New message ° ° °Patient is returning call for lab results. Please call. °

## 2018-08-23 NOTE — Telephone Encounter (Signed)
Informed pt of results. Pt verbalized understanding. 

## 2018-09-03 DIAGNOSIS — N184 Chronic kidney disease, stage 4 (severe): Secondary | ICD-10-CM | POA: Diagnosis not present

## 2018-09-10 ENCOUNTER — Ambulatory Visit (INDEPENDENT_AMBULATORY_CARE_PROVIDER_SITE_OTHER): Payer: Medicare HMO | Admitting: *Deleted

## 2018-09-10 ENCOUNTER — Other Ambulatory Visit: Payer: Self-pay

## 2018-09-10 DIAGNOSIS — I5043 Acute on chronic combined systolic (congestive) and diastolic (congestive) heart failure: Secondary | ICD-10-CM

## 2018-09-10 DIAGNOSIS — E538 Deficiency of other specified B group vitamins: Secondary | ICD-10-CM | POA: Diagnosis not present

## 2018-09-10 MED ORDER — CYANOCOBALAMIN 1000 MCG/ML IJ SOLN
1000.0000 ug | Freq: Once | INTRAMUSCULAR | Status: AC
  Administered 2018-09-10: 1000 ug via INTRAMUSCULAR

## 2018-09-10 NOTE — Progress Notes (Signed)
Per orders of Dr. Burchette, injection of Cyanocobalamin 1000mcg given by Funderburk, Jo A. Patient tolerated injection well.  

## 2018-09-12 ENCOUNTER — Ambulatory Visit: Payer: Medicare HMO

## 2018-09-12 DIAGNOSIS — N2581 Secondary hyperparathyroidism of renal origin: Secondary | ICD-10-CM | POA: Diagnosis not present

## 2018-09-12 DIAGNOSIS — I129 Hypertensive chronic kidney disease with stage 1 through stage 4 chronic kidney disease, or unspecified chronic kidney disease: Secondary | ICD-10-CM | POA: Diagnosis not present

## 2018-09-12 DIAGNOSIS — D631 Anemia in chronic kidney disease: Secondary | ICD-10-CM | POA: Diagnosis not present

## 2018-09-12 DIAGNOSIS — E611 Iron deficiency: Secondary | ICD-10-CM | POA: Diagnosis not present

## 2018-09-12 DIAGNOSIS — N184 Chronic kidney disease, stage 4 (severe): Secondary | ICD-10-CM | POA: Diagnosis not present

## 2018-09-13 ENCOUNTER — Ambulatory Visit (INDEPENDENT_AMBULATORY_CARE_PROVIDER_SITE_OTHER): Payer: Medicare HMO | Admitting: Internal Medicine

## 2018-09-13 ENCOUNTER — Other Ambulatory Visit: Payer: Self-pay

## 2018-09-13 DIAGNOSIS — Z0000 Encounter for general adult medical examination without abnormal findings: Secondary | ICD-10-CM | POA: Diagnosis not present

## 2018-09-13 NOTE — Progress Notes (Signed)
Virtual Visit via Video Note  I connected with [name] on 09/13/18 at 11:30 AM EDT by a video enabled telemedicine application and verified that I am speaking with the correct person using two identifiers.  Location patient: home Location provider: work office Persons participating in the virtual visit: patient, provider  I discussed the limitations of evaluation and management by telemedicine and the availability of in person appointments. The patient expressed understanding and agreed to proceed.   HPI: This is a subsequent Medicare wellness visit.  We performed this using a video platform.  He has been doing well since his hospital discharge, his cardiologist increased his Lasix to 80 mg twice daily.  He has lost 2 pounds since I last saw him in the office.  He has routine eye and dental care, just wears reading glasses.  He has no issues with confusion or forgetfulness, his hearing is intact.  Prior to call when he was going to the gym 3-4 times a week, now walks 20 to 30 minutes a day around his neighborhood.  He does not feel depressed, no sleeping issues no falls in the past year.   ROS: Constitutional: Denies fever, chills, diaphoresis, appetite change and fatigue.  HEENT: Denies photophobia, eye pain, redness, hearing loss, ear pain, congestion, sore throat, rhinorrhea, sneezing, mouth sores, trouble swallowing, neck pain, neck stiffness and tinnitus.   Respiratory: Denies SOB, DOE, cough, chest tightness,  and wheezing.   Cardiovascular: Denies chest pain, palpitations and leg swelling.  Gastrointestinal: Denies nausea, vomiting, abdominal pain, diarrhea, constipation, blood in stool and abdominal distention.  Genitourinary: Denies dysuria, urgency, frequency, hematuria, flank pain and difficulty urinating.  Endocrine: Denies: hot or cold intolerance, sweats, changes in hair or nails, polyuria, polydipsia. Musculoskeletal: Denies myalgias, back pain, joint swelling,  arthralgias and gait problem.  Skin: Denies pallor, rash and wound.  Neurological: Denies dizziness, seizures, syncope, weakness, light-headedness, numbness and headaches.  Hematological: Denies adenopathy. Easy bruising, personal or family bleeding history  Psychiatric/Behavioral: Denies suicidal ideation, mood changes, confusion, nervousness, sleep disturbance and agitation   Past Medical History:  Diagnosis Date  . Arthritis    "touch in my fingers" (09/10/2014)  . Atrophic gastritis   . AVM (arteriovenous malformation) of duodenum, acquired   . B12 deficiency anemia    "stopped taking the shots in ~ 06/2014" (09/10/2014)  . Barrett's esophagus   . CAD (coronary artery disease)    a. H/o MI in 1998;  b. s/p CABG 2003. c. 10/2014 NSTEMI/Cath: LM 75, LAD 176m/d, D1 95, D2 50, LCX 100ost/p, OM1 80, OM2 80, RCA 100p/m, RPDA fills via L->L collats, LIMA->LAD ok, VG->dRCA 100, VG->OM1->OM2 99 prox to OM1 insertion->recanalated ->Tx with heparin,  VG->D1 100-->Med Rx.  . Cataract   . Chronic systolic CHF (congestive heart failure) (HCC)    a. EF 40-45% in 10/2014 (previously normal in 08/2014).  . CKD (chronic kidney disease), stage IV ([place])   . Diabetes 1.5, managed as type 1 ([place])   . Hiatal hernia 2018   endoscopy -Dr. Havery Moros  . History of stomach ulcers 2013  . Hyperlipidemia   . Hypertension   . Hypertensive heart disease   . Ischemic cardiomyopathy    a. 10/2014 Echo: EF 40-45%.  . Myocardial infarction ([place])   . PVD (peripheral vascular disease) ([place])   . Type II diabetes mellitus ([place])     Past Surgical History:  Procedure Laterality Date  . CARDIAC CATHETERIZATION  2003;   . CARDIAC CATHETERIZATION  N/A 11/07/2014   Left Heart Cath; Belva Crome, MD;   . CATARACT EXTRACTION, BILATERAL Bilateral   . CHOLECYSTECTOMY N/A 05/20/2015   Procedure: LAPAROSCOPIC CHOLECYSTECTOMY;  Surgeon: Coralie Keens, MD;  Location: [facility];  Service: General;  Laterality: N/A;  . COLONOSCOPY     . COLONOSCOPY N/A 05/12/2015   Procedure: COLONOSCOPY;  Surgeon: Milus Banister, MD;  Location: [facility];  Service: Endoscopy;  Laterality: N/A;  . [place]   [name] 07/13/2010  . CORONARY ARTERY BYPASS GRAFT  2003   CABG X5  . ESOPHAGOGASTRODUODENOSCOPY  06/10/2011   Procedure: ESOPHAGOGASTRODUODENOSCOPY (EGD);  Surgeon: Ladene Artist, MD,FACG;  Location: [facility] ENDOSCOPY;  Service: Endoscopy;  Laterality: N/A;  . ESOPHAGOGASTRODUODENOSCOPY N/A 10/04/2016   Procedure: ESOPHAGOGASTRODUODENOSCOPY (EGD);  Surgeon: Manus Gunning, MD;  Location: [facility];  Service: Gastroenterology;  Laterality: N/A;    Family History  Problem Relation Age of Onset  . Stroke Mother   . Hypertension Mother   . Cancer Mother   . Diabetes Other        brothers and sisters  . Coronary artery disease Other   . Hypertension Sister   . Heart attack Sister   . Colon cancer Neg Hx     SOCIAL HX:   reports that he has quit smoking. His smoking use included cigarettes. He has a 10.00 pack-year smoking history. He has never used smokeless tobacco. He reports that he does not drink alcohol or use drugs.   Current Outpatient Medications:  .  ACCU-CHEK SOFTCLIX LANCETS lancets, CHECK BLOOD GLUCOSE THREE TIMES DAILY, Disp: 100 each, Rfl: 0 .  acetaminophen (TYLENOL) 500 MG tablet, Take 500 mg by mouth every 8 (eight) hours as needed for mild pain or headache., Disp: , Rfl:  .  Alcohol Swabs (ALCOHOL PREP) PADS, USE TO TEST BLOOD GLUCOSE THREE TIMES DAILY, Disp: 300 each, Rfl: 3 .  amLODipine (NORVASC) 10 MG tablet, TAKE 1 TABLET EVERY DAY, Disp: 90 tablet, Rfl: 0 .  aspirin EC 81 MG tablet, Take 81 mg by mouth at bedtime., Disp: , Rfl:  .  atorvastatin (LIPITOR) 80 MG tablet, Take 1 tablet (80 mg total) by mouth daily at 6 PM. Pt needs to call and make appt before anymore refills, Disp: 90 tablet, Rfl: 0 .  BD PEN NEEDLE NANO U/F 32G X 4 MM MISC, INJECT TWO TIMES DAILY AS NEEDED., Disp:  180 each, Rfl: 3 .  bisacodyl (DULCOLAX) 5 MG EC tablet, Take 5 mg by mouth daily as needed for moderate constipation., Disp: , Rfl:  .  carvedilol (COREG) 25 MG tablet, TAKE 1 TABLET TWICE DAILY WITH MEALS, Disp: 180 tablet, Rfl: 3 .  cloNIDine (CATAPRES) 0.1 MG tablet, Take 1 tablet (0.1 mg total) by mouth at bedtime., Disp: 90 tablet, Rfl: 1 .  Ferrous Sulfate (IRON) 325 (65 Fe) MG TABS, Take 1 tablet by mouth daily., Disp: 30 each, Rfl: 0 .  furosemide (LASIX) 80 MG tablet, Take 1 tablet (80 mg total) by mouth daily., Disp: 180 tablet, Rfl: 3 .  glucose blood (ACCU-CHEK AVIVA PLUS) test strip, USE TO TEST BLOOD GLUCOSE THREE TIMES DAILY, Disp: 300 each, Rfl: 3 .  hydrALAZINE (APRESOLINE) 25 MG tablet, TAKE 1 TABLET (25 MG TOTAL) 3 TIMES DAILY., Disp: 270 tablet, Rfl: 3 .  insulin aspart (NOVOLOG FLEXPEN) 100 UNIT/ML FlexPen, Inject 14 Units into the skin 3 (three) times daily with meals., Disp: , Rfl:  .  Insulin Glargine (LANTUS SOLOSTAR) 100  UNIT/ML Solostar Pen, Inject 12 Units into the skin daily at 10 pm., Disp: 15 mL, Rfl: 3 .  isosorbide mononitrate (IMDUR) 60 MG 24 hr tablet, TAKE 2 TABLETS EVERY DAY, Disp: 180 tablet, Rfl: 0 .  Lancet Devices (ACCU-CHEK SOFTCLIX) lancets, USE TO TEST BLOOD GLUCOSE THREE TIMES DAILY, Disp: 1 each, Rfl: 0 .  latanoprost (XALATAN) 0.005 % ophthalmic solution, Place 1 drop into both eyes at bedtime., Disp: , Rfl:  .  nitroGLYCERIN (NITROSTAT) 0.4 MG SL tablet, Place 1 tablet (0.4 mg total) under the tongue every 5 (five) minutes as needed for chest pain (3 doses MAX)., Disp: 75 tablet, Rfl: 1 .  OVER THE COUNTER MEDICATION, Take 1 tablet by mouth daily. Prostate formula, Disp: , Rfl:  .  pentoxifylline (TRENTAL) 400 MG CR tablet, Take 1 tablet (400 mg total) by mouth 2 (two) times daily. (Take at 10 am and 5 pm), Disp: 180 tablet, Rfl: 1 .  potassium chloride SA (K-DUR) 20 MEQ tablet, Take 1 tablet (20 mEq total) by mouth daily for 7 days., Disp: 7 tablet,  Rfl: 0 .  timolol (TIMOPTIC) 0.5 % ophthalmic solution, Place 1 drop into both eyes every morning., Disp: , Rfl:  .  TRAVATAN Z 0.004 % SOLN ophthalmic solution, Place 1 drop into both eyes every evening., Disp: , Rfl:    VITALS per patient if applicable: None reported  Subsequent Medicare wellness visit   1. Risk factors, based on past  M,S,F -cardiovascular disease risk factors include history of coronary artery disease, hyperlipidemia, hypertension, age   2.  Physical activities: Walking 30 minutes a day 3 times a week   3.  Depression/mood:  Stable, not depressed   4.  Hearing:  No issues   5.  ADL's: Independent in all ADLs   6.  Fall risk:  Low fall risk   7.  Home safety: No problems identified   8.  Height weight, and visual acuity: Not performed as this is a virtual visit due to current COVID-19 precautions   9.  Counseling:  Continue low-salt diet, daily weights and to notify his providers if he notices a significant increase in weight or lower extremity edema   10. Lab orders based on risk factors: Laboratory update will be reviewed   11. Referral :  None today   12. Care plan:  Follow-up with me in 4 months, follow-up with cardiology as scheduled   13. Cognitive assessment:  No cognitive impairment   14. Screening: Patient provided with a written and personalized 5-10 year screening schedule: He is past age for routine cancer screening, his last colonoscopy was in 2017 and he has elected not to continue.   15. Provider List Update:   PCP, cardiology Dr. Tamala Julian, nephrology Dr. Joelyn Oms  16. Advance Directives: Full code     Office Visit from 09/13/2018 in [facility] at [place]  PHQ-9 Total Score  0        Office Visit from 09/13/2018 in [facility] at [facility]  PHQ-9 Total Score  0      Fall Risk  09/13/2018 02/01/2018 08/02/2017 08/02/2017 07/20/2016  Falls in the past year? 0 0 No No No  Number falls in past yr: 0 0 - - -  Injury with Fall?  0 0 - - -  Risk for fall due to : - - - - -  Risk for fall due to: Comment - - - - -  Follow up - - - - -  I discussed the assessment and treatment plan with the patient. The patient was provided an opportunity to ask questions and all were answered. The patient agreed with the plan and demonstrated an understanding of the instructions.   The patient was advised to call back or seek an in-person evaluation if the symptoms worsen or if the condition fails to improve as anticipated.    Lelon Frohlich, MD  [facility] at [facility]

## 2018-10-03 ENCOUNTER — Other Ambulatory Visit: Payer: Self-pay | Admitting: *Deleted

## 2018-10-03 ENCOUNTER — Other Ambulatory Visit: Payer: Self-pay | Admitting: Interventional Cardiology

## 2018-10-03 ENCOUNTER — Ambulatory Visit: Payer: Self-pay | Admitting: Internal Medicine

## 2018-10-03 MED ORDER — CARVEDILOL 25 MG PO TABS: 25.0000 mg | ORAL_TABLET | Freq: Two times a day (BID) | ORAL | 1 refills | Status: DC

## 2018-10-08 ENCOUNTER — Ambulatory Visit (INDEPENDENT_AMBULATORY_CARE_PROVIDER_SITE_OTHER): Payer: Medicare HMO | Admitting: *Deleted

## 2018-10-08 ENCOUNTER — Other Ambulatory Visit: Payer: Self-pay

## 2018-10-08 ENCOUNTER — Telehealth: Payer: Self-pay | Admitting: Internal Medicine

## 2018-10-08 DIAGNOSIS — E538 Deficiency of other specified B group vitamins: Secondary | ICD-10-CM

## 2018-10-08 MED ORDER — CYANOCOBALAMIN 1000 MCG/ML IJ SOLN
1000.0000 ug | Freq: Once | INTRAMUSCULAR | Status: AC
  Administered 2018-10-08: 10:00:00 1000 ug via INTRAMUSCULAR

## 2018-10-08 NOTE — Telephone Encounter (Signed)
See request °

## 2018-10-08 NOTE — Telephone Encounter (Signed)
Medication Refill - Medication: pt called in and stated he is about out of Amlodipine   Has the patient contacted their pharmacy? No. (Agent: If no, request that the patient contact the pharmacy for the refill.) (Agent: If yes, when and what did the pharmacy advise?)  Preferred Pharmacy (with phone number or street name): [facility]    Agent: Please be advised that RX refills may take up to 3 business days. We ask that you follow-up with your pharmacy.

## 2018-10-08 NOTE — Progress Notes (Signed)
Per orders of Dr. Burchette, injection of Cyanocobalamin 1000mcg given by Funderburk, Jo A. Patient tolerated injection well.  

## 2018-10-09 MED ORDER — AMLODIPINE BESYLATE 10 MG PO TABS: 10.0000 mg | ORAL_TABLET | Freq: Every day | ORAL | 1 refills | Status: DC

## 2018-10-09 NOTE — Telephone Encounter (Signed)
Refill sent.

## 2018-10-11 ENCOUNTER — Telehealth: Payer: Self-pay | Admitting: Internal Medicine

## 2018-10-11 MED ORDER — AMLODIPINE BESYLATE 10 MG PO TABS: 10.0000 mg | ORAL_TABLET | Freq: Every day | ORAL | 1 refills | Status: DC

## 2018-10-11 NOTE — Telephone Encounter (Signed)
See request °

## 2018-10-11 NOTE — Telephone Encounter (Signed)
Medication Refill - Medication: amLODipine (NORVASC) 10 MG tablet  Pt states medication was sent to the wrong pharmacy. Pt would like to note he is out of medication.   Please re-send to correct pharmacy:   Preferred Pharmacy:  [facility], [place] [phone] (Phone) [phone] (Fax)

## 2018-10-11 NOTE — Telephone Encounter (Signed)
Rx sent 

## 2018-10-17 DIAGNOSIS — H401132 Primary open-angle glaucoma, bilateral, moderate stage: Secondary | ICD-10-CM | POA: Diagnosis not present

## 2018-10-17 DIAGNOSIS — H35351 Cystoid macular degeneration, right eye: Secondary | ICD-10-CM | POA: Diagnosis not present

## 2018-10-31 ENCOUNTER — Other Ambulatory Visit: Payer: Self-pay | Admitting: Interventional Cardiology

## 2018-11-13 ENCOUNTER — Ambulatory Visit: Payer: Self-pay | Admitting: Internal Medicine

## 2018-11-14 ENCOUNTER — Other Ambulatory Visit: Payer: Self-pay

## 2018-11-14 ENCOUNTER — Ambulatory Visit (INDEPENDENT_AMBULATORY_CARE_PROVIDER_SITE_OTHER): Payer: Medicare HMO | Admitting: Internal Medicine

## 2018-11-14 ENCOUNTER — Encounter: Payer: Self-pay | Admitting: Internal Medicine

## 2018-11-14 VITALS — BP 120/70 | HR 55 | Temp 97.7°F | Wt 165.3 lb

## 2018-11-14 DIAGNOSIS — E785 Hyperlipidemia, unspecified: Secondary | ICD-10-CM | POA: Diagnosis not present

## 2018-11-14 DIAGNOSIS — I10 Essential (primary) hypertension: Secondary | ICD-10-CM | POA: Diagnosis not present

## 2018-11-14 DIAGNOSIS — E538 Deficiency of other specified B group vitamins: Secondary | ICD-10-CM | POA: Diagnosis not present

## 2018-11-14 DIAGNOSIS — E1122 Type 2 diabetes mellitus with diabetic chronic kidney disease: Secondary | ICD-10-CM

## 2018-11-14 DIAGNOSIS — Z794 Long term (current) use of insulin: Secondary | ICD-10-CM | POA: Diagnosis not present

## 2018-11-14 DIAGNOSIS — Z23 Encounter for immunization: Secondary | ICD-10-CM | POA: Diagnosis not present

## 2018-11-14 DIAGNOSIS — I5042 Chronic combined systolic (congestive) and diastolic (congestive) heart failure: Secondary | ICD-10-CM

## 2018-11-14 DIAGNOSIS — N184 Chronic kidney disease, stage 4 (severe): Secondary | ICD-10-CM

## 2018-11-14 LAB — POCT GLYCOSYLATED HEMOGLOBIN (HGB A1C): Hemoglobin A1C: 6.4 % — AB (ref 4.0–5.6)

## 2018-11-14 MED ORDER — CYANOCOBALAMIN 1000 MCG/ML IJ SOLN
1000.0000 ug | Freq: Once | INTRAMUSCULAR | Status: AC
  Administered 2018-11-14: 17:00:00 1000 ug via INTRAMUSCULAR

## 2018-11-14 NOTE — Progress Notes (Signed)
Established Patient Office Visit     CC/Reason for Visit: Follow-up chronic conditions  HPI: [name] is a 85 y.o. male who is coming in today for the above mentioned reasons. Past Medical History is significant for: Chronic combined heart failure with a known ejection fraction of 30 to 35% and grade 2 diastolic dysfunction, hypertension, coronary artery disease, diabetes which is insulin-dependent,, chronic kidney disease stage IV, B12 deficiency. His coronary artery disease and chronic combined heart failure is well controlled, follows with cardiology routinely on a biannual to annual basis, his stage IV kidney disease is also followed by nephrology and has been stable (patient states he would never want to go on dialysis), his diabetes is well controlled on insulin, hetakes Lantus 12 units at bedtime as well as meal coverage NovoLog 14 units 3 times a day.  He has no acute complaints today, no new changes since I last saw him.   Past Medical/Surgical History: Past Medical History:  Diagnosis Date  . Arthritis    "touch in my fingers" (09/10/2014)  . Atrophic gastritis   . AVM (arteriovenous malformation) of duodenum, acquired   . B12 deficiency anemia    "stopped taking the shots in ~ 06/2014" (09/10/2014)  . Barrett's esophagus   . CAD (coronary artery disease)    a. H/o MI in 1998;  b. s/p CABG 2003. c. 10/2014 NSTEMI/Cath: LM 75, LAD 154m/d, D1 95, D2 50, LCX 100ost/p, OM1 80, OM2 80, RCA 100p/m, RPDA fills via L->L collats, LIMA->LAD ok, VG->dRCA 100, VG->OM1->OM2 99 prox to OM1 insertion->recanalated ->Tx with heparin,  VG->D1 100-->Med Rx.  . Cataract   . Chronic systolic CHF (congestive heart failure) (HCC)    a. EF 40-45% in 10/2014 (previously normal in 08/2014).  . CKD (chronic kidney disease), stage IV ([place])   . Diabetes 1.5, managed as type 1 ([place])   . Hiatal hernia 2018   endoscopy -Dr. Havery Moros  . History of stomach ulcers 2013  . Hyperlipidemia   .  Hypertension   . Hypertensive heart disease   . Ischemic cardiomyopathy    a. 10/2014 Echo: EF 40-45%.  . Myocardial infarction ([place])   . PVD (peripheral vascular disease) ([place])   . Type II diabetes mellitus ([place])     Past Surgical History:  Procedure Laterality Date  . CARDIAC CATHETERIZATION  2003;   . CARDIAC CATHETERIZATION N/A 11/07/2014   Left Heart Cath; Belva Crome, MD;   . CATARACT EXTRACTION, BILATERAL Bilateral   . CHOLECYSTECTOMY N/A 05/20/2015   Procedure: LAPAROSCOPIC CHOLECYSTECTOMY;  Surgeon: Coralie Keens, MD;  Location: [facility];  Service: General;  Laterality: N/A;  . COLONOSCOPY    . COLONOSCOPY N/A 05/12/2015   Procedure: COLONOSCOPY;  Surgeon: Milus Banister, MD;  Location: [facility];  Service: Endoscopy;  Laterality: N/A;  . [place]   [name] 07/13/2010  . CORONARY ARTERY BYPASS GRAFT  2003   CABG X5  . ESOPHAGOGASTRODUODENOSCOPY  06/10/2011   Procedure: ESOPHAGOGASTRODUODENOSCOPY (EGD);  Surgeon: Ladene Artist, MD,FACG;  Location: [facility] ENDOSCOPY;  Service: Endoscopy;  Laterality: N/A;  . ESOPHAGOGASTRODUODENOSCOPY N/A 10/04/2016   Procedure: ESOPHAGOGASTRODUODENOSCOPY (EGD);  Surgeon: Manus Gunning, MD;  Location: [facility];  Service: Gastroenterology;  Laterality: N/A;    Social History:  reports that he has quit smoking. His smoking use included cigarettes. He has a 10.00 pack-year smoking history. He has never used smokeless tobacco. He reports that he does not drink alcohol or use drugs.  Allergies: No Known Allergies  Family History:  Family History  Problem Relation Age of Onset  . Stroke Mother   . Hypertension Mother   . Cancer Mother   . Diabetes Other        brothers and sisters  . Coronary artery disease Other   . Hypertension Sister   . Heart attack Sister   . Colon cancer Neg Hx      Current Outpatient Medications:  .  ACCU-CHEK SOFTCLIX LANCETS lancets, CHECK BLOOD GLUCOSE THREE TIMES DAILY, Disp:  100 each, Rfl: 0 .  acetaminophen (TYLENOL) 500 MG tablet, Take 500 mg by mouth every 8 (eight) hours as needed for mild pain or headache., Disp: , Rfl:  .  Alcohol Swabs (ALCOHOL PREP) PADS, USE TO TEST BLOOD GLUCOSE THREE TIMES DAILY, Disp: 300 each, Rfl: 3 .  amLODipine (NORVASC) 10 MG tablet, Take 1 tablet (10 mg total) by mouth daily., Disp: 90 tablet, Rfl: 1 .  aspirin EC 81 MG tablet, Take 81 mg by mouth at bedtime., Disp: , Rfl:  .  atorvastatin (LIPITOR) 80 MG tablet, Take 1 tablet (80 mg total) by mouth daily at 6 PM., Disp: 90 tablet, Rfl: 3 .  BD PEN NEEDLE NANO U/F 32G X 4 MM MISC, INJECT TWO TIMES DAILY AS NEEDED., Disp: 180 each, Rfl: 3 .  bisacodyl (DULCOLAX) 5 MG EC tablet, Take 5 mg by mouth daily as needed for moderate constipation., Disp: , Rfl:  .  carvedilol (COREG) 25 MG tablet, Take 1 tablet (25 mg total) by mouth 2 (two) times daily with a meal., Disp: 180 tablet, Rfl: 1 .  cloNIDine (CATAPRES) 0.1 MG tablet, Take 1 tablet (0.1 mg total) by mouth at bedtime., Disp: 90 tablet, Rfl: 1 .  Ferrous Sulfate (IRON) 325 (65 Fe) MG TABS, Take 1 tablet by mouth daily., Disp: 30 each, Rfl: 0 .  furosemide (LASIX) 80 MG tablet, Take 1 tablet (80 mg total) by mouth daily., Disp: 180 tablet, Rfl: 3 .  glucose blood (ACCU-CHEK AVIVA PLUS) test strip, USE TO TEST BLOOD GLUCOSE THREE TIMES DAILY, Disp: 300 each, Rfl: 3 .  hydrALAZINE (APRESOLINE) 25 MG tablet, TAKE 1 TABLET (25 MG TOTAL) 3 TIMES DAILY., Disp: 270 tablet, Rfl: 3 .  insulin aspart (NOVOLOG FLEXPEN) 100 UNIT/ML FlexPen, Inject 14 Units into the skin 3 (three) times daily with meals., Disp: , Rfl:  .  Insulin Glargine (LANTUS SOLOSTAR) 100 UNIT/ML Solostar Pen, Inject 12 Units into the skin daily at 10 pm., Disp: 15 mL, Rfl: 3 .  isosorbide mononitrate (IMDUR) 60 MG 24 hr tablet, TAKE 2 TABLETS EVERY DAY, Disp: 180 tablet, Rfl: 2 .  Lancet Devices (ACCU-CHEK SOFTCLIX) lancets, USE TO TEST BLOOD GLUCOSE THREE TIMES DAILY, Disp: 1  each, Rfl: 0 .  latanoprost (XALATAN) 0.005 % ophthalmic solution, Place 1 drop into both eyes at bedtime., Disp: , Rfl:  .  nitroGLYCERIN (NITROSTAT) 0.4 MG SL tablet, Place 1 tablet (0.4 mg total) under the tongue every 5 (five) minutes as needed for chest pain (3 doses MAX)., Disp: 75 tablet, Rfl: 1 .  OVER THE COUNTER MEDICATION, Take 1 tablet by mouth daily. Prostate formula, Disp: , Rfl:  .  pentoxifylline (TRENTAL) 400 MG CR tablet, Take 1 tablet (400 mg total) by mouth 2 (two) times daily. (Take at 10 am and 5 pm), Disp: 180 tablet, Rfl: 1 .  timolol (TIMOPTIC) 0.5 % ophthalmic solution, Place 1 drop into both eyes every morning., Disp: , Rfl:  .  TRAVATAN Z 0.004 % SOLN ophthalmic solution, Place 1 drop into both eyes every evening., Disp: , Rfl:  .  potassium chloride SA (K-DUR) 20 MEQ tablet, Take 1 tablet (20 mEq total) by mouth daily for 7 days., Disp: 7 tablet, Rfl: 0  Review of Systems:  Constitutional: Denies fever, chills, diaphoresis, appetite change and fatigue.  HEENT: Denies photophobia, eye pain, redness, hearing loss, ear pain, congestion, sore throat, rhinorrhea, sneezing, mouth sores, trouble swallowing, neck pain, neck stiffness and tinnitus.   Respiratory: Denies SOB, DOE, cough, chest tightness,  and wheezing.   Cardiovascular: Denies chest pain, palpitations and leg swelling.  Gastrointestinal: Denies nausea, vomiting, abdominal pain, diarrhea, constipation, blood in stool and abdominal distention.  Genitourinary: Denies dysuria, urgency, frequency, hematuria, flank pain and difficulty urinating.  Endocrine: Denies: hot or cold intolerance, sweats, changes in hair or nails, polyuria, polydipsia. Musculoskeletal: Denies myalgias, back pain, joint swelling, arthralgias and gait problem.  Skin: Denies pallor, rash and wound.  Neurological: Denies dizziness, seizures, syncope, weakness, light-headedness, numbness and headaches.  Hematological: Denies adenopathy. Easy  bruising, personal or family bleeding history  Psychiatric/Behavioral: Denies suicidal ideation, mood changes, confusion, nervousness, sleep disturbance and agitation    Physical Exam: Vitals:   11/14/18 1133  BP: 120/70  Pulse: (!) 55  Temp: 97.7 F (36.5 C)  TempSrc: Temporal  SpO2: 97%  Weight: 165 lb 4.8 oz (75 kg)    Body mass index is 23.72 kg/m.   Constitutional: NAD, calm, comfortable Eyes: PERRL, lids and conjunctivae normal ENMT: Mucous membranes are moist. Posterior pharynx clear of any exudate or lesions. Normal dentition. Tympanic membrane is pearly white, no erythema or bulging. Neck: normal, supple, no masses, no thyromegaly Respiratory: clear to auscultation bilaterally, no wheezing, no crackles. Normal respiratory effort. No accessory muscle use.  Cardiovascular: Regular rate and rhythm, no murmurs / rubs / gallops. No extremity edema. 2+ pedal pulses. No carotid bruits.  Abdomen: no tenderness, no masses palpated. No hepatosplenomegaly. Bowel sounds positive.  Musculoskeletal: no clubbing / cyanosis. No joint deformity upper and lower extremities. Good ROM, no contractures. Normal muscle tone.  Skin: no rashes, lesions, ulcers. No induration Neurologic: CN 2-12 grossly intact. Sensation intact, DTR normal. Strength 5/5 in all 4.  Psychiatric: Normal judgment and insight. Alert and oriented x 3. Normal mood.    Impression and Plan:  Type 2 diabetes mellitus with stage 4 chronic kidney disease, with long-term current use of insulin ([place]) -Well-controlled with an A1c of 6.4 today.  B12 deficiency -Will receive IM injection today.  Continue monthly supplementation.  Dyslipidemia -Last LDL was 45 in June 2019, continue statin.  Essential hypertension -Well-controlled on current regimen.  Chronic combined systolic and diastolic heart failure ([place]) -Compensated today. -Echo from March 2018 with ejection fraction of 30 to 35% and grade 2 diastolic  dysfunction. -He is on Coreg, atorvastatin, aspirin, Lasix, not on ACE inhibitor/ARB/ARN I due to advanced renal insufficiency.  CKD (chronic kidney disease), stage IV (HCC) -Stable, follows routinely with nephrology.    Patient Instructions  -Nice seeing you today!!  -Schedule follow up in 3-4 months.     Lelon Frohlich, MD [facility]

## 2018-11-14 NOTE — Addendum Note (Signed)
Addended by: Westley Hummer B on: 11/14/2018 05:02 PM   Modules accepted: Orders

## 2018-11-14 NOTE — Patient Instructions (Signed)
-  Nice seeing you today!!  -Schedule follow up in 3-4 months. 

## 2018-11-15 NOTE — Progress Notes (Signed)
CARDIOLOGY OFFICE NOTE  Date:  11/21/2018    [name] Date of Birth: 02/22/1932 Medical Record #[id number]  PCP:  [name], [name], MD  Cardiologist:  Tamala Julian    Chief Complaint  Patient presents with  . Follow-up    History of Present Illness: [name] is a 85 y.o. male who presents today for a 3 month check. Seen for Dr. Tamala Julian.   He has a history of known CAD with prior CABG per EBG in 2003 and known bypass graft failure with only remaining conduit LIMA to LAD by cath in September 2016. Other issues include CKD stage IV, DM, chronic systolic HF with EF of 35 to 40%, PAD, HTN stomach ulcers, prior GI bleeding with Plavix and HLD.   Last seen by Dr. Tamala Julian back in June - had had prior admission for acute combined systolic & diastolic HF. Had had some adjustments made in his diuretics. Still did not feel his breathing was as good as it could be and diuretics were adjusted further.   The patient does not have symptoms concerning for COVID-19 infection (fever, chills, cough, or new shortness of breath).   Comes in today. Here alone. He is doing well. No chest pain. Breathing is good. He tells me he is not short of breath. No swelling.  Weight is stable. He says his kidney function has been stable according to nephrology. Feels ok on his medicines. Tolerating his medicines. He has not been back to the gym - has been walking outside. Really has no concerns today.   Past Medical History:  Diagnosis Date  . Arthritis    "touch in my fingers" (09/10/2014)  . Atrophic gastritis   . AVM (arteriovenous malformation) of duodenum, acquired   . B12 deficiency anemia    "stopped taking the shots in ~ 06/2014" (09/10/2014)  . Barrett's esophagus   . CAD (coronary artery disease)    a. H/o MI in 1998;  b. s/p CABG 2003. c. 10/2014 NSTEMI/Cath: LM 75, LAD 117m/d, D1 95, D2 50, LCX 100ost/p, OM1 80, OM2 80, RCA 100p/m, RPDA fills via L->L collats, LIMA->LAD ok,  VG->dRCA 100, VG->OM1->OM2 99 prox to OM1 insertion->recanalated ->Tx with heparin,  VG->D1 100-->Med Rx.  . Cataract   . Chronic systolic CHF (congestive heart failure) (HCC)    a. EF 40-45% in 10/2014 (previously normal in 08/2014).  . CKD (chronic kidney disease), stage IV ([place])   . Diabetes 1.5, managed as type 1 ([place])   . Hiatal hernia 2018   endoscopy -Dr. Havery Moros  . History of stomach ulcers 2013  . Hyperlipidemia   . Hypertension   . Hypertensive heart disease   . Ischemic cardiomyopathy    a. 10/2014 Echo: EF 40-45%.  . Myocardial infarction ([place])   . PVD (peripheral vascular disease) ([place])   . Type II diabetes mellitus ([place])     Past Surgical History:  Procedure Laterality Date  . CARDIAC CATHETERIZATION  2003;   . CARDIAC CATHETERIZATION N/A 11/07/2014   Left Heart Cath; Belva Crome, MD;   . CATARACT EXTRACTION, BILATERAL Bilateral   . CHOLECYSTECTOMY N/A 05/20/2015   Procedure: LAPAROSCOPIC CHOLECYSTECTOMY;  Surgeon: Coralie Keens, MD;  Location: [place];  Service: General;  Laterality: N/A;  . COLONOSCOPY    . COLONOSCOPY N/A 05/12/2015   Procedure: COLONOSCOPY;  Surgeon: Milus Banister, MD;  Location: [place];  Service: Endoscopy;  Laterality: N/A;  . [place]   [name]  07/13/2010  . CORONARY ARTERY BYPASS GRAFT  2003   CABG X5  . ESOPHAGOGASTRODUODENOSCOPY  06/10/2011   Procedure: ESOPHAGOGASTRODUODENOSCOPY (EGD);  Surgeon: Ladene Artist, MD,FACG;  Location: [facility] ENDOSCOPY;  Service: Endoscopy;  Laterality: N/A;  . ESOPHAGOGASTRODUODENOSCOPY N/A 10/04/2016   Procedure: ESOPHAGOGASTRODUODENOSCOPY (EGD);  Surgeon: Manus Gunning, MD;  Location: [facility];  Service: Gastroenterology;  Laterality: N/A;     Medications: Current Meds  Medication Sig  . ACCU-CHEK SOFTCLIX LANCETS lancets CHECK BLOOD GLUCOSE THREE TIMES DAILY  . acetaminophen (TYLENOL) 500 MG tablet Take 500 mg by mouth every 8 (eight) hours as needed for mild pain or  headache.  . Alcohol Swabs (ALCOHOL PREP) PADS USE TO TEST BLOOD GLUCOSE THREE TIMES DAILY  . amLODipine (NORVASC) 10 MG tablet Take 1 tablet (10 mg total) by mouth daily.  Marland Kitchen aspirin EC 81 MG tablet Take 81 mg by mouth at bedtime.  Marland Kitchen atorvastatin (LIPITOR) 80 MG tablet Take 1 tablet (80 mg total) by mouth daily at 6 PM.  . BD PEN NEEDLE NANO U/F 32G X 4 MM MISC INJECT TWO TIMES DAILY AS NEEDED.  . bisacodyl (DULCOLAX) 5 MG EC tablet Take 5 mg by mouth daily as needed for moderate constipation.  . carvedilol (COREG) 25 MG tablet Take 1 tablet (25 mg total) by mouth 2 (two) times daily with a meal.  . cloNIDine (CATAPRES) 0.1 MG tablet TAKE 1 TABLET (0.1 MG TOTAL) BY MOUTH AT BEDTIME.  Marland Kitchen Ferrous Sulfate (IRON) 325 (65 Fe) MG TABS Take 1 tablet by mouth daily.  . furosemide (LASIX) 80 MG tablet Take 1 tablet (80 mg total) by mouth daily.  Marland Kitchen glucose blood (ACCU-CHEK AVIVA PLUS) test strip USE TO TEST BLOOD GLUCOSE THREE TIMES DAILY  . hydrALAZINE (APRESOLINE) 25 MG tablet TAKE 1 TABLET (25 MG TOTAL) 3 TIMES DAILY.  Marland Kitchen insulin aspart (NOVOLOG FLEXPEN) 100 UNIT/ML FlexPen Inject 14 Units into the skin 3 (three) times daily with meals.  . Insulin Glargine (LANTUS SOLOSTAR) 100 UNIT/ML Solostar Pen Inject 12 Units into the skin daily at 10 pm.  . isosorbide mononitrate (IMDUR) 60 MG 24 hr tablet TAKE 2 TABLETS EVERY DAY  . Lancet Devices (ACCU-CHEK SOFTCLIX) lancets USE TO TEST BLOOD GLUCOSE THREE TIMES DAILY  . latanoprost (XALATAN) 0.005 % ophthalmic solution Place 1 drop into both eyes at bedtime.  . nitroGLYCERIN (NITROSTAT) 0.4 MG SL tablet Place 1 tablet (0.4 mg total) under the tongue every 5 (five) minutes as needed for chest pain (3 doses MAX).  Marland Kitchen OVER THE COUNTER MEDICATION Take 1 tablet by mouth daily. Prostate formula  . pentoxifylline (TRENTAL) 400 MG CR tablet TAKE 1 TABLET (400 MG TOTAL) BY MOUTH 2 (TWO) TIMES DAILY. (TAKE AT 10 AM AND 5 PM)  . timolol (TIMOPTIC) 0.5 % ophthalmic solution  Place 1 drop into both eyes every morning.  . TRAVATAN Z 0.004 % SOLN ophthalmic solution Place 1 drop into both eyes every evening.     Allergies: No Known Allergies  Social History: The patient  reports that he has quit smoking. His smoking use included cigarettes. He has a 10.00 pack-year smoking history. He has never used smokeless tobacco. He reports that he does not drink alcohol or use drugs.   Family History: The patient's family history includes Cancer in his mother; Coronary artery disease in an other family member; Diabetes in an other family member; Heart attack in his sister; Hypertension in his mother and sister; Stroke in his mother.  Review of Systems: Please see the history of present illness.   All other systems are reviewed and negative.   Physical Exam: VS:  BP 118/60   Pulse (!) 57   Ht 5\' 10"  (1.778 m)   Wt 163 lb (73.9 kg)   SpO2 97%   BMI 23.39 kg/m  .  BMI Body mass index is 23.39 kg/m.  Wt Readings from Last 3 Encounters:  11/21/18 163 lb (73.9 kg)  11/14/18 165 lb 4.8 oz (75 kg)  08/20/18 163 lb 9.6 oz (74.2 kg)    General: Pleasant. Looks much younger than his stated age. Alert and in no acute distress.  Neck: Supple, no JVD, carotid bruits, or masses noted.  Cardiac: Regular rate and rhythm. II/VI apical murmur noted. No edema. Weight is stable.   Respiratory:  Lungs are clear to auscultation bilaterally with normal work of breathing.  GI: Soft and nontender.  MS: No deformity or atrophy. Gait and ROM intact.  Skin: Warm and dry. Color is normal.  Neuro:  Strength and sensation are intact and no gross focal deficits noted.  Psych: Alert, appropriate and with normal affect.   LABORATORY DATA:  EKG:  EKG is not ordered today.  Lab Results  Component Value Date   WBC 6.0 08/22/2018   HGB 11.1 (L) 08/22/2018   HCT 32.9 (L) 08/22/2018   PLT 238.0 08/22/2018   GLUCOSE 135 (H) 08/20/2018   CHOL 103 08/02/2017   TRIG 171.0 (H) 08/02/2017    HDL 23.90 (L) 08/02/2017   LDLDIRECT 93.3 01/22/2013   LDLCALC 45 08/02/2017   ALT 20 08/11/2018   AST 19 08/11/2018   NA 139 08/20/2018   K 4.6 08/20/2018   CL 103 08/20/2018   CREATININE 3.85 (H) 08/20/2018   BUN 71 (H) 08/20/2018   CO2 19 (L) 08/20/2018   TSH 4.84 (H) 08/02/2017   PSA 1.12 01/22/2013   INR 1.18 05/17/2015   HGBA1C 6.4 (A) 11/14/2018   MICROALBUR 30.8 (H) 08/02/2017       BNP (last 3 results) Recent Labs    08/11/18 0800  BNP 969.0*    ProBNP (last 3 results) No results for input(s): PROBNP in the last 8760 hours.   Other Studies Reviewed Today:  ECHO IMPRESSIONS 07/2018  1. The left ventricle has moderate-severely reduced systolic function, with an ejection fraction of 30-35%. The cavity size was mildly dilated. Left ventricular diastolic Doppler parameters are consistent with restrictive filling. There is akinesis of  the basal to mid inferior and inferolateral walls and the basal anterolateral wall. 2. There is mild mitral annular calcification present. No evidence of mitral valve stenosis. Mild mitral regurgitation. 3. The aortic valve is tricuspid. Mild calcification of the aortic valve. Aortic valve regurgitation is trivial by color flow Doppler. No stenosis of the aortic valve. 4. The aortic root is normal in size and structure. 5. Normal RV size with mildly decreased systolic function. 6. The inferior vena cava was dilated in size with <50% respiratory variability. PA systolic pressure 57 mmHg.   Left Heart Cath and Coronary Angiography 10/2014  Conclusion   Bypass graft failure with total occlusion of the SVG to the RCA, total occlusion of the SVG to the diagonal, and thrombotic high-grade stenosis throughout the entirety of the sequential saphenous vein graft to the first and second obtuse marginal.  Widely patent left internal mammary graft to the LAD. Distal LAD supplies collaterals to the right coronary.  Severe native  vessel coronary disease with  total occlusion of the mid LAD, severe diffuse disease in the first diagonal, 50-70% distal left main, and total occlusion of the proximal RCA.  Left ventriculography was not performed. Acute heart failure, denoted by the markedly elevated LVEDP (35-40 mmHg) is present.   RECOMMENDATIONS:   Closely monitor kidney function  IV diuretic therapy to treat acute heart failure  IV heparin, may help to maintain patency in the thrombosed sequential saphenous vein graft. My thought is that this graft was totally occluded resulting in marked enzyme release. On IV heparin the vessel has subsequently recanalized. We should also add Plavix to the patient's current medical regimen. Intervention on the sequential saphenous vein graft will require significant contrast. Final decision will be based upon clinical course, i.e., if recurrent angina/equivalent, may need to consider PCI.  Echo for LV assessment, with appropriate management dependent upon EF. This could include consideration of device therapy in the future if indicated.     Assessment/Plan:  1. CAD with prior CABG and known graft failure - doing well clinically with medical therapy - no active symptoms. No changes made today.   2. Chronic combined systolic & diastolic HF - looks very compensated. Weight is stable. No changes made today.   3. CKD - tells me that nephrology has felt like his kidney function has been stable.   4. HTN - BP is great - no changes made today.   5. HLD - on statin - lab today.   6. DM - per PCP  7. PAD  8. COVID-19 Education: The signs and symptoms of COVID-19 were discussed with the patient and how to seek care for testing (follow up with PCP or arrange E-visit).  The importance of social distancing, staying at home, hand hygiene and wearing a mask when out in public were discussed today.  Current medicines are reviewed with the patient today.  The patient does not have  concerns regarding medicines other than what has been noted above.  The following changes have been made:  See above.  Labs/ tests ordered today include:    Orders Placed This Encounter  Procedures  . Basic metabolic panel  . Hepatic function panel  . Lipid panel     Disposition:   FU with Dr. Tamala Julian in a few months. I am happy to see back.   Patient is agreeable to this plan and will call if any problems develop in the interim.   SignedTruitt Merle, NP  11/21/2018 9:53 AM  [place] [address] [place], [place]  [address] Phone: [phone] Fax: [phone]

## 2018-11-16 ENCOUNTER — Other Ambulatory Visit: Payer: Self-pay | Admitting: Internal Medicine

## 2018-11-21 ENCOUNTER — Other Ambulatory Visit: Payer: Self-pay

## 2018-11-21 ENCOUNTER — Encounter: Payer: Self-pay | Admitting: Nurse Practitioner

## 2018-11-21 ENCOUNTER — Ambulatory Visit: Payer: Medicare HMO | Admitting: Nurse Practitioner

## 2018-11-21 VITALS — BP 118/60 | HR 57 | Ht 70.0 in | Wt 163.0 lb

## 2018-11-21 DIAGNOSIS — E785 Hyperlipidemia, unspecified: Secondary | ICD-10-CM | POA: Diagnosis not present

## 2018-11-21 DIAGNOSIS — I5042 Chronic combined systolic (congestive) and diastolic (congestive) heart failure: Secondary | ICD-10-CM | POA: Diagnosis not present

## 2018-11-21 DIAGNOSIS — N184 Chronic kidney disease, stage 4 (severe): Secondary | ICD-10-CM

## 2018-11-21 DIAGNOSIS — I10 Essential (primary) hypertension: Secondary | ICD-10-CM | POA: Diagnosis not present

## 2018-11-21 DIAGNOSIS — I25709 Atherosclerosis of coronary artery bypass graft(s), unspecified, with unspecified angina pectoris: Secondary | ICD-10-CM

## 2018-11-21 DIAGNOSIS — Z7189 Other specified counseling: Secondary | ICD-10-CM | POA: Diagnosis not present

## 2018-11-21 DIAGNOSIS — Z794 Long term (current) use of insulin: Secondary | ICD-10-CM

## 2018-11-21 DIAGNOSIS — E0821 Diabetes mellitus due to underlying condition with diabetic nephropathy: Secondary | ICD-10-CM

## 2018-11-21 LAB — BASIC METABOLIC PANEL
BUN/Creatinine Ratio: 13 (ref 10–24)
BUN: 61 mg/dL — ABNORMAL HIGH (ref 8–27)
CO2: 19 mmol/L — ABNORMAL LOW (ref 20–29)
Calcium: 9.7 mg/dL (ref 8.6–10.2)
Chloride: 103 mmol/L (ref 96–106)
Creatinine, Ser: 4.52 mg/dL — ABNORMAL HIGH (ref 0.76–1.27)
GFR calc Af Amer: 13 mL/min/{1.73_m2} — ABNORMAL LOW (ref >59)
GFR calc non Af Amer: 11 mL/min/{1.73_m2} — ABNORMAL LOW (ref >59)
Glucose: 124 mg/dL — ABNORMAL HIGH (ref 65–99)
Potassium: 3.8 mmol/L (ref 3.5–5.2)
Sodium: 140 mmol/L (ref 134–144)

## 2018-11-21 LAB — HEPATIC FUNCTION PANEL
ALT: 14 IU/L (ref 0–44)
AST: 15 IU/L (ref 0–40)
Albumin: 4.5 g/dL (ref 3.6–4.6)
Alkaline Phosphatase: 90 IU/L (ref 39–117)
Bilirubin Total: 0.3 mg/dL (ref 0.0–1.2)
Bilirubin, Direct: 0.11 mg/dL (ref 0.00–0.40)
Total Protein: 7.1 g/dL (ref 6.0–8.5)

## 2018-11-21 LAB — LIPID PANEL
Chol/HDL Ratio: 5.2 ratio — ABNORMAL HIGH (ref 0.0–5.0)
Cholesterol, Total: 120 mg/dL (ref 100–199)
HDL: 23 mg/dL — ABNORMAL LOW (ref >39)
LDL Chol Calc (NIH): 61 mg/dL (ref 0–99)
Triglycerides: 215 mg/dL — ABNORMAL HIGH (ref 0–149)
VLDL Cholesterol Cal: 36 mg/dL (ref 5–40)

## 2018-11-21 NOTE — Patient Instructions (Addendum)
After Visit Summary:  We will be checking the following labs today - BMET, HPF and lipids   Medication Instructions:    Continue with your current medicines.    If you need a refill on your cardiac medications before your next appointment, please call your pharmacy.     Testing/Procedures To Be Arranged:  N/A  Follow-Up:   See Dr. Tamala Julian 4 months.     At [facility], you and your health needs are our priority.  As part of our continuing mission to provide you with exceptional heart care, we have created designated Provider Care Teams.  These Care Teams include your primary Cardiologist (physician) and Advanced Practice Providers (APPs -  Physician Assistants and Nurse Practitioners) who all work together to provide you with the care you need, when you need it.  Special Instructions:  . Stay safe, stay home, wash your hands for at least 20 seconds and wear a mask when out in public.  . It was good to talk with you today.  Marland Kitchen Keep up the good work.    Call the [place] office at [phone] if you have any questions, problems or concerns.

## 2018-11-22 ENCOUNTER — Telehealth: Payer: Self-pay | Admitting: Nurse Practitioner

## 2018-11-22 NOTE — Telephone Encounter (Signed)
Returned call to pt.  He has been made aware of his lab results. See result note.

## 2018-11-22 NOTE — Telephone Encounter (Signed)
New message   Patient is returning call about lab results. Please call.

## 2018-11-28 ENCOUNTER — Other Ambulatory Visit: Payer: Self-pay | Admitting: Interventional Cardiology

## 2018-12-10 ENCOUNTER — Telehealth: Payer: Self-pay | Admitting: Internal Medicine

## 2018-12-10 NOTE — Telephone Encounter (Signed)
Pt is needing a refill for glucose blood (ACCU-CHEK AVIVA PLUS) test strip / meter   Pharmacy:  [facility] [place], [place] [phone] (Phone) [phone] (Fax)

## 2018-12-11 MED ORDER — ACCU-CHEK AVIVA PLUS VI STRP: ORAL_STRIP | 3 refills | Status: DC

## 2018-12-11 NOTE — Telephone Encounter (Signed)
Rx has been sent in. Nothing further needed. 

## 2018-12-12 DIAGNOSIS — N185 Chronic kidney disease, stage 5: Secondary | ICD-10-CM | POA: Diagnosis not present

## 2018-12-12 MED ORDER — ACCU-CHEK AVIVA PLUS W/DEVICE KIT: PACK | 0 refills | Status: DC

## 2018-12-12 NOTE — Telephone Encounter (Signed)
Rx sent 

## 2018-12-12 NOTE — Telephone Encounter (Signed)
Pharmacy called and stated that the pt is also requesting a meter to be sent in. Please advise

## 2018-12-12 NOTE — Addendum Note (Signed)
Addended by: Westley Hummer B on: 12/12/2018 05:12 PM   Modules accepted: Orders

## 2018-12-14 ENCOUNTER — Ambulatory Visit: Payer: Medicare HMO

## 2018-12-19 ENCOUNTER — Ambulatory Visit (INDEPENDENT_AMBULATORY_CARE_PROVIDER_SITE_OTHER): Payer: Medicare HMO

## 2018-12-19 ENCOUNTER — Other Ambulatory Visit: Payer: Self-pay

## 2018-12-19 DIAGNOSIS — E538 Deficiency of other specified B group vitamins: Secondary | ICD-10-CM

## 2018-12-19 MED ORDER — CYANOCOBALAMIN 1000 MCG/ML IJ SOLN
1000.0000 ug | Freq: Once | INTRAMUSCULAR | Status: AC
  Administered 2018-12-19: 11:00:00 1000 ug via INTRAMUSCULAR

## 2018-12-19 NOTE — Progress Notes (Signed)
Per orders of Dr. Hernandez, injection of B12 given by Shristi Scheib. Patient tolerated injection well.  

## 2019-01-02 DIAGNOSIS — I120 Hypertensive chronic kidney disease with stage 5 chronic kidney disease or end stage renal disease: Secondary | ICD-10-CM | POA: Diagnosis not present

## 2019-01-02 DIAGNOSIS — D631 Anemia in chronic kidney disease: Secondary | ICD-10-CM | POA: Diagnosis not present

## 2019-01-02 DIAGNOSIS — N185 Chronic kidney disease, stage 5: Secondary | ICD-10-CM | POA: Diagnosis not present

## 2019-01-02 DIAGNOSIS — N2581 Secondary hyperparathyroidism of renal origin: Secondary | ICD-10-CM | POA: Diagnosis not present

## 2019-01-02 DIAGNOSIS — E611 Iron deficiency: Secondary | ICD-10-CM | POA: Diagnosis not present

## 2019-01-21 ENCOUNTER — Ambulatory Visit (INDEPENDENT_AMBULATORY_CARE_PROVIDER_SITE_OTHER): Payer: Medicare HMO | Admitting: *Deleted

## 2019-01-21 ENCOUNTER — Other Ambulatory Visit: Payer: Self-pay

## 2019-01-21 DIAGNOSIS — E538 Deficiency of other specified B group vitamins: Secondary | ICD-10-CM

## 2019-01-21 MED ORDER — CYANOCOBALAMIN 1000 MCG/ML IJ SOLN
1000.0000 ug | Freq: Once | INTRAMUSCULAR | Status: AC
  Administered 2019-01-21: 1000 ug via INTRAMUSCULAR

## 2019-01-21 NOTE — Progress Notes (Signed)
Per orders of Dr. Koberlein, injection of Cyanocobalamin 1000mcg given by Funderburk, Jo A. Patient tolerated injection well. 

## 2019-01-31 ENCOUNTER — Telehealth: Payer: Self-pay

## 2019-01-31 DIAGNOSIS — D649 Anemia, unspecified: Secondary | ICD-10-CM

## 2019-01-31 NOTE — Telephone Encounter (Signed)
Called and LM for pt that he is due for CBC to check anemia.  Left him the hours of the lab and our number to call back with questions. Sending pt letter as well that he is due for labs.

## 2019-01-31 NOTE — Telephone Encounter (Signed)
-----   Message from Roetta Sessions, [place] sent at 08/22/2018  3:40 PM EDT ----- Regarding: cbc due in Dec 2020 CBC due in Dec 2020 for multifactorial anemia.

## 2019-02-05 ENCOUNTER — Encounter: Payer: Self-pay | Admitting: Internal Medicine

## 2019-02-05 ENCOUNTER — Ambulatory Visit (INDEPENDENT_AMBULATORY_CARE_PROVIDER_SITE_OTHER): Payer: Medicare HMO | Admitting: Internal Medicine

## 2019-02-05 ENCOUNTER — Other Ambulatory Visit: Payer: Self-pay

## 2019-02-05 VITALS — BP 150/64 | HR 62 | Temp 97.7°F | Wt 163.7 lb

## 2019-02-05 DIAGNOSIS — N289 Disorder of kidney and ureter, unspecified: Secondary | ICD-10-CM

## 2019-02-05 DIAGNOSIS — M10371 Gout due to renal impairment, right ankle and foot: Secondary | ICD-10-CM | POA: Diagnosis not present

## 2019-02-05 MED ORDER — COLCHICINE 0.6 MG PO TABS: 0.6000 mg | ORAL_TABLET | Freq: Every day | ORAL | 1 refills | Status: DC

## 2019-02-05 NOTE — Progress Notes (Signed)
Established Patient Office Visit     This visit occurred during the SARS-CoV-2 public health emergency.  Safety protocols were in place, including screening questions prior to the visit, additional usage of staff PPE, and extensive cleaning of exam room while observing appropriate contact time as indicated for disinfecting solutions.    CC/Reason for Visit: Pain right big toe  HPI: [name] is a 85 y.o. male who is coming in today for the above mentioned reasons.  He has a prior history of gout in the same toe, this flareup started about 3 to 4 days ago, no injuries that he can recall.  Difficulty walking due to pain.  Past Medical/Surgical History: Past Medical History:  Diagnosis Date  . Arthritis    "touch in my fingers" (09/10/2014)  . Atrophic gastritis   . AVM (arteriovenous malformation) of duodenum, acquired   . B12 deficiency anemia    "stopped taking the shots in ~ 06/2014" (09/10/2014)  . Barrett's esophagus   . CAD (coronary artery disease)    a. H/o MI in 1998;  b. s/p CABG 2003. c. 10/2014 NSTEMI/Cath: LM 75, LAD 167md, D1 95, D2 50, LCX 100ost/p, OM1 80, OM2 80, RCA 100p/m, RPDA fills via L->L collats, LIMA->LAD ok, VG->dRCA 100, VG->OM1->OM2 99 prox to OM1 insertion->recanalated ->Tx with heparin,  VG->D1 100-->Med Rx.  . Cataract   . Chronic systolic CHF (congestive heart failure) (HCC)    a. EF 40-45% in 10/2014 (previously normal in 08/2014).  . CKD (chronic kidney disease), stage IV ([place]   . Diabetes 1.5, managed as type 1 ([place]   . Hiatal hernia 2018   endoscopy -Dr. AHavery Moros . History of stomach ulcers 2013  . Hyperlipidemia   . Hypertension   . Hypertensive heart disease   . Ischemic cardiomyopathy    a. 10/2014 Echo: EF 40-45%.  . Myocardial infarction ([place]   . PVD (peripheral vascular disease) ([place]   . Type II diabetes mellitus ([place]     Past Surgical History:  Procedure Laterality Date  . CARDIAC CATHETERIZATION  2003;   .  CARDIAC CATHETERIZATION N/A 11/07/2014   Left Heart Cath; HBelva Crome MD;   . CATARACT EXTRACTION, BILATERAL Bilateral   . CHOLECYSTECTOMY N/A 05/20/2015   Procedure: LAPAROSCOPIC CHOLECYSTECTOMY;  Surgeon: DCoralie Keens MD;  [place]  Service: General;  Laterality: N/A;  . COLONOSCOPY    . COLONOSCOPY N/A 05/12/2015   Procedure: COLONOSCOPY;  Surgeon: DMilus Banister MD;  [place]  Service: Endoscopy;  Laterality: N/A;  . [place]  /[name]5/15/2012  . CORONARY ARTERY BYPASS GRAFT  2003   CABG X5  . ESOPHAGOGASTRODUODENOSCOPY  06/10/2011   Procedure: ESOPHAGOGASTRODUODENOSCOPY (EGD);  Surgeon: MLadene Artist MD,FACG;  [facility]  Service: Endoscopy;  Laterality: N/A;  . ESOPHAGOGASTRODUODENOSCOPY N/A 10/04/2016   Procedure: ESOPHAGOGASTRODUODENOSCOPY (EGD);  Surgeon: AManus Gunning MD;  [facility];  Service: Gastroenterology;  Laterality: N/A;    Social History:  reports that he has quit smoking. His smoking use included cigarettes. He has a 10.00 pack-year smoking history. He has never used smokeless tobacco. He reports that he does not drink alcohol or use drugs.  Allergies: No Known Allergies  Family History:  Family History  Problem Relation Age of Onset  . Stroke Mother   . Hypertension Mother   . Cancer Mother   . Diabetes Other        brothers and sisters  .  Coronary artery disease Other   . Hypertension Sister   . Heart attack Sister   . Colon cancer Neg Hx      Current Outpatient Medications:  .  ACCU-CHEK SOFTCLIX LANCETS lancets, CHECK BLOOD GLUCOSE THREE TIMES DAILY, Disp: 100 each, Rfl: 0 .  acetaminophen (TYLENOL) 500 MG tablet, Take 500 mg by mouth every 8 (eight) hours as needed for mild pain or headache., Disp: , Rfl:  .  Alcohol Swabs (ALCOHOL PREP) PADS, USE TO TEST BLOOD GLUCOSE THREE TIMES DAILY, Disp: 300 each, Rfl: 3 .  amLODipine (NORVASC) 10 MG tablet, Take 1 tablet (10  mg total) by mouth daily., Disp: 90 tablet, Rfl: 1 .  aspirin EC 81 MG tablet, Take 81 mg by mouth at bedtime., Disp: , Rfl:  .  atorvastatin (LIPITOR) 80 MG tablet, Take 1 tablet (80 mg total) by mouth daily at 6 PM., Disp: 90 tablet, Rfl: 3 .  BD PEN NEEDLE NANO U/F 32G X 4 MM MISC, INJECT TWO TIMES DAILY AS NEEDED., Disp: 180 each, Rfl: 3 .  bisacodyl (DULCOLAX) 5 MG EC tablet, Take 5 mg by mouth daily as needed for moderate constipation., Disp: , Rfl:  .  Blood Glucose Monitoring Suppl (ACCU-CHEK AVIVA PLUS) w/Device KIT, Use to check glucose levels, Disp: 1 kit, Rfl: 0 .  carvedilol (COREG) 25 MG tablet, Take 1 tablet (25 mg total) by mouth 2 (two) times daily with a meal., Disp: 180 tablet, Rfl: 1 .  cloNIDine (CATAPRES) 0.1 MG tablet, TAKE 1 TABLET (0.1 MG TOTAL) BY MOUTH AT BEDTIME., Disp: 90 tablet, Rfl: 1 .  Ferrous Sulfate (IRON) 325 (65 Fe) MG TABS, Take 1 tablet by mouth daily., Disp: 30 each, Rfl: 0 .  furosemide (LASIX) 80 MG tablet, Take 1 tablet (80 mg total) by mouth daily., Disp: 180 tablet, Rfl: 3 .  glucose blood (ACCU-CHEK AVIVA PLUS) test strip, USE TO TEST BLOOD GLUCOSE THREE TIMES DAILY, Disp: 300 each, Rfl: 3 .  hydrALAZINE (APRESOLINE) 25 MG tablet, TAKE 1 TABLET THREE TIMES DAILY, Disp: 270 tablet, Rfl: 3 .  insulin aspart (NOVOLOG FLEXPEN) 100 UNIT/ML FlexPen, Inject 14 Units into the skin 3 (three) times daily with meals., Disp: , Rfl:  .  Insulin Glargine (LANTUS SOLOSTAR) 100 UNIT/ML Solostar Pen, Inject 12 Units into the skin daily at 10 pm., Disp: 15 mL, Rfl: 3 .  isosorbide mononitrate (IMDUR) 60 MG 24 hr tablet, TAKE 2 TABLETS EVERY DAY, Disp: 180 tablet, Rfl: 2 .  Lancet Devices (ACCU-CHEK SOFTCLIX) lancets, USE TO TEST BLOOD GLUCOSE THREE TIMES DAILY, Disp: 1 each, Rfl: 0 .  latanoprost (XALATAN) 0.005 % ophthalmic solution, Place 1 drop into both eyes at bedtime., Disp: , Rfl:  .  nitroGLYCERIN (NITROSTAT) 0.4 MG SL tablet, Place 1 tablet (0.4 mg total) under  the tongue every 5 (five) minutes as needed for chest pain (3 doses MAX)., Disp: 75 tablet, Rfl: 1 .  OVER THE COUNTER MEDICATION, Take 1 tablet by mouth daily. Prostate formula, Disp: , Rfl:  .  pentoxifylline (TRENTAL) 400 MG CR tablet, TAKE 1 TABLET (400 MG TOTAL) BY MOUTH 2 (TWO) TIMES DAILY. (TAKE AT 10 AM AND 5 PM), Disp: 180 tablet, Rfl: 1 .  timolol (TIMOPTIC) 0.5 % ophthalmic solution, Place 1 drop into both eyes every morning., Disp: , Rfl:  .  TRAVATAN Z 0.004 % SOLN ophthalmic solution, Place 1 drop into both eyes every evening., Disp: , Rfl:  .  colchicine 0.6 MG tablet, Take  1 tablet (0.6 mg total) by mouth daily., Disp: 60 tablet, Rfl: 1  Review of Systems:  Constitutional: Denies fever, chills, diaphoresis, appetite change and fatigue.  HEENT: Denies photophobia, eye pain, redness, hearing loss, ear pain, congestion, sore throat, rhinorrhea, sneezing, mouth sores, trouble swallowing, neck pain, neck stiffness and tinnitus.   Respiratory: Denies SOB, DOE, cough, chest tightness,  and wheezing.   Cardiovascular: Denies chest pain, palpitations and leg swelling.  Gastrointestinal: Denies nausea, vomiting, abdominal pain, diarrhea, constipation, blood in stool and abdominal distention.  Genitourinary: Denies dysuria, urgency, frequency, hematuria, flank pain and difficulty urinating.  Endocrine: Denies: hot or cold intolerance, sweats, changes in hair or nails, polyuria, polydipsia. Musculoskeletal: Denies myalgias, back pain, swelling of the right big toe. Skin: Denies pallor, rash and wound.  Neurological: Denies dizziness, seizures, syncope, weakness, light-headedness, numbness and headaches.  Hematological: Denies adenopathy. Easy bruising, personal or family bleeding history  Psychiatric/Behavioral: Denies suicidal ideation, mood changes, confusion, nervousness, sleep disturbance and agitation    Physical Exam: Vitals:   02/05/19 1512  BP: (!) 150/64  Pulse: 62  Temp:  97.7 F (36.5 C)  TempSrc: Temporal  SpO2: 98%  Weight: 163 lb 11.2 oz (74.3 kg)    Body mass index is 23.49 kg/m.   Constitutional: NAD, calm, comfortable Eyes: PERRL, lids and conjunctivae normal ENMT: Mucous membranes are moist.  Musculoskeletal: Erythema and redness of the right big toe. Neurologic: Grossly intact and nonfocal Psychiatric: Normal judgment and insight. Alert and oriented x 3. Normal mood.    Impression and Plan:  Acute gout due to renal impairment involving toe of right foot -  -He has had gout flareups before, this feels the same. -Start colchicine 0.6 mg twice daily, return in 14 days if no improvement.     Lelon Frohlich, MD [facility]

## 2019-02-07 ENCOUNTER — Other Ambulatory Visit (INDEPENDENT_AMBULATORY_CARE_PROVIDER_SITE_OTHER): Payer: Medicare HMO

## 2019-02-07 DIAGNOSIS — D649 Anemia, unspecified: Secondary | ICD-10-CM | POA: Diagnosis not present

## 2019-02-07 LAB — CBC WITH DIFFERENTIAL/PLATELET
Basophils Absolute: 0 10*3/uL (ref 0.0–0.1)
Basophils Relative: 0.8 % (ref 0.0–3.0)
Eosinophils Absolute: 0.3 10*3/uL (ref 0.0–0.7)
Eosinophils Relative: 5.5 % — ABNORMAL HIGH (ref 0.0–5.0)
HCT: 34.9 % — ABNORMAL LOW (ref 39.0–52.0)
Hemoglobin: 11.3 g/dL — ABNORMAL LOW (ref 13.0–17.0)
Lymphocytes Relative: 15.4 % (ref 12.0–46.0)
Lymphs Abs: 0.8 10*3/uL (ref 0.7–4.0)
MCHC: 32.4 g/dL (ref 30.0–36.0)
MCV: 95.2 fl (ref 78.0–100.0)
Monocytes Absolute: 0.4 10*3/uL (ref 0.1–1.0)
Monocytes Relative: 8.3 % (ref 3.0–12.0)
Neutro Abs: 3.6 10*3/uL (ref 1.4–7.7)
Neutrophils Relative %: 70 % (ref 43.0–77.0)
Platelets: 188 10*3/uL (ref 150.0–400.0)
RBC: 3.67 Mil/uL — ABNORMAL LOW (ref 4.22–5.81)
RDW: 15.3 % (ref 11.5–15.5)
WBC: 5.1 10*3/uL (ref 4.0–10.5)

## 2019-02-11 ENCOUNTER — Telehealth: Payer: Self-pay | Admitting: *Deleted

## 2019-02-11 NOTE — Telephone Encounter (Signed)
Copied from [place] [id number]. Topic: General - Inquiry >> Feb 11, 2019  1:20 PM Mathis Bud wrote: Reason for CRM: Patient has questions regarding medications. Call back [phone]

## 2019-02-12 NOTE — Telephone Encounter (Signed)
Message routed to PCP's CMA for review.

## 2019-02-12 NOTE — Telephone Encounter (Signed)
Patient called in stating he would like to speak with nurse in regards to letter he got from [place] about his medications. Please advise.

## 2019-02-12 NOTE — Telephone Encounter (Signed)
Spoke with pharmacist at [facility].

## 2019-02-13 DIAGNOSIS — H401132 Primary open-angle glaucoma, bilateral, moderate stage: Secondary | ICD-10-CM | POA: Diagnosis not present

## 2019-02-14 ENCOUNTER — Other Ambulatory Visit: Payer: Self-pay

## 2019-02-15 ENCOUNTER — Encounter: Payer: Self-pay | Admitting: Internal Medicine

## 2019-02-15 ENCOUNTER — Ambulatory Visit (INDEPENDENT_AMBULATORY_CARE_PROVIDER_SITE_OTHER): Payer: Medicare HMO | Admitting: Internal Medicine

## 2019-02-15 VITALS — BP 160/60 | HR 60 | Temp 97.2°F | Wt 162.7 lb

## 2019-02-15 DIAGNOSIS — I5042 Chronic combined systolic (congestive) and diastolic (congestive) heart failure: Secondary | ICD-10-CM

## 2019-02-15 DIAGNOSIS — N184 Chronic kidney disease, stage 4 (severe): Secondary | ICD-10-CM | POA: Diagnosis not present

## 2019-02-15 DIAGNOSIS — E1122 Type 2 diabetes mellitus with diabetic chronic kidney disease: Secondary | ICD-10-CM

## 2019-02-15 DIAGNOSIS — M10371 Gout due to renal impairment, right ankle and foot: Secondary | ICD-10-CM | POA: Diagnosis not present

## 2019-02-15 DIAGNOSIS — Z794 Long term (current) use of insulin: Secondary | ICD-10-CM

## 2019-02-15 DIAGNOSIS — I25709 Atherosclerosis of coronary artery bypass graft(s), unspecified, with unspecified angina pectoris: Secondary | ICD-10-CM | POA: Diagnosis not present

## 2019-02-15 DIAGNOSIS — E538 Deficiency of other specified B group vitamins: Secondary | ICD-10-CM | POA: Diagnosis not present

## 2019-02-15 DIAGNOSIS — I10 Essential (primary) hypertension: Secondary | ICD-10-CM | POA: Diagnosis not present

## 2019-02-15 DIAGNOSIS — E785 Hyperlipidemia, unspecified: Secondary | ICD-10-CM

## 2019-02-15 LAB — POCT GLYCOSYLATED HEMOGLOBIN (HGB A1C): Hemoglobin A1C: 6 % — AB (ref 4.0–5.6)

## 2019-02-15 MED ORDER — CYANOCOBALAMIN 1000 MCG/ML IJ SOLN
1000.0000 ug | Freq: Once | INTRAMUSCULAR | Status: AC
  Administered 2019-02-15: 16:00:00 1000 ug via INTRAMUSCULAR

## 2019-02-15 NOTE — Patient Instructions (Signed)
-  Nice seeing you today!!  -Schedule follow up in 3 months. 

## 2019-02-15 NOTE — Addendum Note (Signed)
Addended by: Westley Hummer B on: 02/15/2019 03:44 PM   Modules accepted: Orders

## 2019-02-15 NOTE — Progress Notes (Signed)
Established Patient Office Visit     This visit occurred during the SARS-CoV-2 public health emergency.  Safety protocols were in place, including screening questions prior to the visit, additional usage of staff PPE, and extensive cleaning of exam room while observing appropriate contact time as indicated for disinfecting solutions.    CC/Reason for Visit: 21-monthfollow-up chronic conditions  HPI: Brandon SESLERis a 85y.o. male who is coming in today for the above mentioned reasons. Past Medical History is significant for:  Chronic combined heart failure with a known ejection fraction of 30 to 35% and grade 2 diastolic dysfunction, hypertension that has been well controlled in the past, coronary artery disease, diabetes which is insulin-dependent,, chronic kidney disease stage IV, B12 deficiency. His coronary artery disease and chronic combined heart failure is well controlled, follows with cardiology routinely on a biannual to annual basis, his stage IV kidney disease is also followed by nephrology and has been stable (patient states he would never want to go on dialysis), his diabetes is well controlled on insulin, hetakes Lantus 12 units at bedtime as well as meal coverage NovoLog 14 units 3 times a day.  He has no acute complaints today.  I saw him recently for a gout flareup of his right foot.  I prescribed colchicine.  He is much improved.   Past Medical/Surgical History: Past Medical History:  Diagnosis Date  . Arthritis    "touch in my fingers" (09/10/2014)  . Atrophic gastritis   . AVM (arteriovenous malformation) of duodenum, acquired   . B12 deficiency anemia    "stopped taking the shots in ~ 06/2014" (09/10/2014)  . Barrett's esophagus   . CAD (coronary artery disease)    a. H/o MI in 1998;  b. s/p CABG 2003. c. 10/2014 NSTEMI/Cath: LM 75, LAD 1069m, D1 95, D2 50, LCX 100ost/p, OM1 80, OM2 80, RCA 100p/m, RPDA fills via L->L collats, LIMA->LAD ok, VG->dRCA 100,  VG->OM1->OM2 99 prox to OM1 insertion->recanalated ->Tx with heparin,  VG->D1 100-->Med Rx.  . Cataract   . Chronic systolic CHF (congestive heart failure) (HCC)    a. EF 40-45% in 10/2014 (previously normal in 08/2014).  . CKD (chronic kidney disease), stage IV ([facility]  . Diabetes 1.5, managed as type 1 (HCL[place]  . Hiatal hernia 2018   endoscopy -Dr. ArHavery Moros. History of stomach ulcers 2013  . Hyperlipidemia   . Hypertension   . Hypertensive heart disease   . Ischemic cardiomyopathy    a. 10/2014 Echo: EF 40-45%.  . Myocardial infarction ([facility]  . PVD (peripheral vascular disease) (HCDick[place]  . Type II diabetes mellitus (HCL[place]    Past Surgical History:  Procedure Laterality Date  . CARDIAC CATHETERIZATION  2003;   . CARDIAC CATHETERIZATION N/A 11/07/2014   Left Heart Cath; HeBelva CromeMD;   . CATARACT EXTRACTION, BILATERAL Bilateral   . CHOLECYSTECTOMY N/A 05/20/2015   Procedure: LAPAROSCOPIC CHOLECYSTECTOMY;  Surgeon: DoCoralie KeensMD;  [facility] Service: General;  Laterality: N/A;  . COLONOSCOPY    . COLONOSCOPY N/A 05/12/2015   Procedure: COLONOSCOPY;  Surgeon: DaMilus BanisterMD;  [facility] Service: Endoscopy;  Laterality: N/A;  . COJoaquin /nArchie Endo/15/2012  . CORONARY ARTERY BYPASS GRAFT  2003   CABG X5  . ESOPHAGOGASTRODUODENOSCOPY  06/10/2011   Procedure: ESOPHAGOGASTRODUODENOSCOPY (EGD);  Surgeon: MaLadene ArtistMD,FACG;  [facility];  Service: Endoscopy;  Laterality: N/A;  . ESOPHAGOGASTRODUODENOSCOPY N/A 10/04/2016   Procedure: ESOPHAGOGASTRODUODENOSCOPY (EGD);  Surgeon: Manus Gunning, MD;  [facility];  Service: Gastroenterology;  Laterality: N/A;    Social History:  reports that he has quit smoking. His smoking use included cigarettes. He has a 10.00 pack-year smoking history. He has never used smokeless tobacco. He reports that he does not drink alcohol or use drugs.  Allergies: No Known  Allergies  Family History:  Family History  Problem Relation Age of Onset  . Stroke Mother   . Hypertension Mother   . Cancer Mother   . Diabetes Other        brothers and sisters  . Coronary artery disease Other   . Hypertension Sister   . Heart attack Sister   . Colon cancer Neg Hx      Current Outpatient Medications:  .  ACCU-CHEK SOFTCLIX LANCETS lancets, CHECK BLOOD GLUCOSE THREE TIMES DAILY, Disp: 100 each, Rfl: 0 .  acetaminophen (TYLENOL) 500 MG tablet, Take 500 mg by mouth every 8 (eight) hours as needed for mild pain or headache., Disp: , Rfl:  .  Alcohol Swabs (ALCOHOL PREP) PADS, USE TO TEST BLOOD GLUCOSE THREE TIMES DAILY, Disp: 300 each, Rfl: 3 .  amLODipine (NORVASC) 10 MG tablet, Take 1 tablet (10 mg total) by mouth daily., Disp: 90 tablet, Rfl: 1 .  aspirin EC 81 MG tablet, Take 81 mg by mouth at bedtime., Disp: , Rfl:  .  atorvastatin (LIPITOR) 80 MG tablet, Take 1 tablet (80 mg total) by mouth daily at 6 PM., Disp: 90 tablet, Rfl: 3 .  BD PEN NEEDLE NANO U/F 32G X 4 MM MISC, INJECT TWO TIMES DAILY AS NEEDED., Disp: 180 each, Rfl: 3 .  bisacodyl (DULCOLAX) 5 MG EC tablet, Take 5 mg by mouth daily as needed for moderate constipation., Disp: , Rfl:  .  Blood Glucose Monitoring Suppl (ACCU-CHEK AVIVA PLUS) w/Device KIT, Use to check glucose levels, Disp: 1 kit, Rfl: 0 .  carvedilol (COREG) 25 MG tablet, Take 1 tablet (25 mg total) by mouth 2 (two) times daily with a meal., Disp: 180 tablet, Rfl: 1 .  cloNIDine (CATAPRES) 0.1 MG tablet, TAKE 1 TABLET (0.1 MG TOTAL) BY MOUTH AT BEDTIME., Disp: 90 tablet, Rfl: 1 .  colchicine 0.6 MG tablet, Take 1 tablet (0.6 mg total) by mouth daily., Disp: 60 tablet, Rfl: 1 .  Ferrous Sulfate (IRON) 325 (65 Fe) MG TABS, Take 1 tablet by mouth daily., Disp: 30 each, Rfl: 0 .  furosemide (LASIX) 80 MG tablet, Take 1 tablet (80 mg total) by mouth daily., Disp: 180 tablet, Rfl: 3 .  glucose blood (ACCU-CHEK AVIVA PLUS) test strip, USE TO  TEST BLOOD GLUCOSE THREE TIMES DAILY, Disp: 300 each, Rfl: 3 .  hydrALAZINE (APRESOLINE) 25 MG tablet, TAKE 1 TABLET THREE TIMES DAILY, Disp: 270 tablet, Rfl: 3 .  insulin aspart (NOVOLOG FLEXPEN) 100 UNIT/ML FlexPen, Inject 14 Units into the skin 3 (three) times daily with meals., Disp: , Rfl:  .  Insulin Glargine (LANTUS SOLOSTAR) 100 UNIT/ML Solostar Pen, Inject 12 Units into the skin daily at 10 pm., Disp: 15 mL, Rfl: 3 .  isosorbide mononitrate (IMDUR) 60 MG 24 hr tablet, TAKE 2 TABLETS EVERY DAY, Disp: 180 tablet, Rfl: 2 .  Lancet Devices (ACCU-CHEK SOFTCLIX) lancets, USE TO TEST BLOOD GLUCOSE THREE TIMES DAILY, Disp: 1 each, Rfl: 0 .  latanoprost (XALATAN) 0.005 % ophthalmic solution, Place 1 drop into both eyes at  bedtime., Disp: , Rfl:  .  nitroGLYCERIN (NITROSTAT) 0.4 MG SL tablet, Place 1 tablet (0.4 mg total) under the tongue every 5 (five) minutes as needed for chest pain (3 doses MAX)., Disp: 75 tablet, Rfl: 1 .  OVER THE COUNTER MEDICATION, Take 1 tablet by mouth daily. Prostate formula, Disp: , Rfl:  .  pentoxifylline (TRENTAL) 400 MG CR tablet, TAKE 1 TABLET (400 MG TOTAL) BY MOUTH 2 (TWO) TIMES DAILY. (TAKE AT 10 AM AND 5 PM), Disp: 180 tablet, Rfl: 1 .  timolol (TIMOPTIC) 0.5 % ophthalmic solution, Place 1 drop into both eyes every morning., Disp: , Rfl:  .  TRAVATAN Z 0.004 % SOLN ophthalmic solution, Place 1 drop into both eyes every evening., Disp: , Rfl:   Review of Systems:  Constitutional: Denies fever, chills, diaphoresis, appetite change and fatigue.  HEENT: Denies photophobia, eye pain, redness, hearing loss, ear pain, congestion, sore throat, rhinorrhea, sneezing, mouth sores, trouble swallowing, neck pain, neck stiffness and tinnitus.   Respiratory: Denies SOB, DOE, cough, chest tightness,  and wheezing.   Cardiovascular: Denies chest pain, palpitations and leg swelling.  Gastrointestinal: Denies nausea, vomiting, abdominal pain, diarrhea, constipation, blood in stool  and abdominal distention.  Genitourinary: Denies dysuria, urgency, frequency, hematuria, flank pain and difficulty urinating.  Endocrine: Denies: hot or cold intolerance, sweats, changes in hair or nails, polyuria, polydipsia. Musculoskeletal: Denies myalgias, back pain, joint swelling, arthralgias and gait problem.  Skin: Denies pallor, rash and wound.  Neurological: Denies dizziness, seizures, syncope, weakness, light-headedness, numbness and headaches.  Hematological: Denies adenopathy. Easy bruising, personal or family bleeding history  Psychiatric/Behavioral: Denies suicidal ideation, mood changes, confusion, nervousness, sleep disturbance and agitation    Physical Exam: Vitals:   02/15/19 0925 02/15/19 1013  BP: (!) 150/80 (!) 160/60  Pulse: 60   Temp: (!) 97.2 F (36.2 C)   TempSrc: Temporal   SpO2: 98%   Weight: 162 lb 11.2 oz (73.8 kg)     Body mass index is 23.35 kg/m.   Constitutional: NAD, calm, comfortable Eyes: PERRL, lids and conjunctivae normal ENMT: Mucous membranes are moist.  Respiratory: clear to auscultation bilaterally, no wheezing, no crackles. Normal respiratory effort. No accessory muscle use.  Cardiovascular: Regular rate and rhythm, no murmurs / rubs / gallops. No extremity edema. 2+ pedal pulses.   Abdomen: no tenderness, no masses palpated. No hepatosplenomegaly. Bowel sounds positive.  Musculoskeletal: no clubbing / cyanosis. No joint deformity upper and lower extremities. Good ROM, no contractures. Normal muscle tone.  Skin: no rashes, lesions, ulcers. No induration Neurologic: CN 2-12 grossly intact. Sensation intact, DTR normal. Strength 5/5 in all 4.  Psychiatric: Normal judgment and insight. Alert and oriented x 3. Normal mood.    Impression and Plan:  Type 2 diabetes mellitus with stage 4 chronic kidney disease, with long-term current use of insulin (HCC)  -Excellent control with an A1c of 6.0 in office today, continue Lantus and sliding  scale with meals.  Dyslipidemia -Last LDL was 45 in June 2019, continue high intensity statin.  Coronary artery disease involving 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 (HCC) -Stable, no chest pain, followed by cardiology  Essential hypertension -Uncontrolled today on 2 separate measurements. -He has follow-up with cardiology next week, he prefers cardiology to adjust any of his medications.  CKD (chronic kidney disease), stage IV (HCC) -Baseline creatinine is around 4.79, he follows routinely with nephrology.  B12 deficiency -To receive monthly IM injection today.  Chronic combined systolic and diastolic  heart failure (HCC) -Stable, compensated  Acute gout due to renal impairment involving toe of right foot -Improved significantly with colchicine.    Patient Instructions  -Nice seeing you today!!  -Schedule follow up in 3 months.     Lelon Frohlich, MD [facility]

## 2019-02-19 NOTE — Progress Notes (Signed)
Cardiology Office Note:    Date:  02/20/2019   ID:  [name], DOB 1931/03/19, MRN [id number]  PCP:  [name], [name], MD  Cardiologist:  [name], MD   Referring MD: [name], [name]*   Chief Complaint  Patient presents with  . Coronary Artery Disease  . Congestive Heart Failure    History of Present Illness:    [name] is a 85 y.o. male with a hx of CAD, bypass graft failure with only remaining conduit LIMA to LAD by cath in September 2016, chronic kidney disease stage IV, diabetes mellitus II, chronic systolic heart failure with LVEF 35-40%, peripheral arterial disease, hypertension, stomach ulcers (with GI bleeding while on Plavix), and hyperlipidemia.  [name] is losing weight.  He says he just does not have an appetite.  He denies shortness of breath, orthopnea, PND, and lower extremity swelling.  His most significant comorbidity is stage V CKD.  According to him, he has decided that he will not accept dialysis.  This led to some discussion concerning CODE STATUS.  He does not want to be on life support but also is not willing to officially declare.  He will think about it and speak to his family members before making a final decision.  Past Medical History:  Diagnosis Date  . Arthritis    "touch in my fingers" (09/10/2014)  . Atrophic gastritis   . AVM (arteriovenous malformation) of duodenum, acquired   . B12 deficiency anemia    "stopped taking the shots in ~ 06/2014" (09/10/2014)  . Barrett's esophagus   . CAD (coronary artery disease)    a. H/o MI in 1998;  b. s/p CABG 2003. c. 10/2014 NSTEMI/Cath: LM 75, LAD 131md, D1 95, D2 50, LCX 100ost/p, OM1 80, OM2 80, RCA 100p/m, RPDA fills via L->L collats, LIMA->LAD ok, VG->dRCA 100, VG->OM1->OM2 99 prox to OM1 insertion->recanalated ->Tx with heparin,  VG->D1 100-->Med Rx.  . Cataract   . Chronic systolic CHF (congestive heart failure) (HCC)    a. EF 40-45% in 10/2014 (previously  normal in 08/2014).  . CKD (chronic kidney disease), stage IV ([place]   . Diabetes 1.5, managed as type 1 (HS[place]   . Hiatal hernia 2018   endoscopy -Dr. AHavery Moros . History of stomach ulcers 2013  . Hyperlipidemia   . Hypertension   . Hypertensive heart disease   . Ischemic cardiomyopathy    a. 10/2014 Echo: EF 40-45%.  . Myocardial infarction ([place]   . PVD (peripheral vascular disease) ([place]   . Type II diabetes mellitus ([place]     Past Surgical History:  Procedure Laterality Date  . CARDIAC CATHETERIZATION  2003;   . CARDIAC CATHETERIZATION N/A 11/07/2014   Left Heart Cath; HBelva Crome MD;   . CATARACT EXTRACTION, BILATERAL Bilateral   . CHOLECYSTECTOMY N/A 05/20/2015   Procedure: LAPAROSCOPIC CHOLECYSTECTOMY;  Surgeon: DCoralie Keens MD;  [facility]  Service: General;  Laterality: N/A;  . COLONOSCOPY    . COLONOSCOPY N/A 05/12/2015   Procedure: COLONOSCOPY;  Surgeon: DMilus Banister MD;  [place] [facility]  Service: Endoscopy;  Laterality: N/A;  . CTift  /[name]5/15/2012  . CORONARY ARTERY BYPASS GRAFT  2003   CABG X5  . ESOPHAGOGASTRODUODENOSCOPY  06/10/2011   Procedure: ESOPHAGOGASTRODUODENOSCOPY (EGD);  Surgeon: MLadene Artist MD,FACG;  [facility]  Service: Endoscopy;  Laterality: N/A;  . ESOPHAGOGASTRODUODENOSCOPY N/A 10/04/2016   Procedure: ESOPHAGOGASTRODUODENOSCOPY (EGD);  Surgeon:  Armbruster, Renelda Loma, MD;  [facility];  Service: Gastroenterology;  Laterality: N/A;    Current Medications: Current Meds  Medication Sig  . ACCU-CHEK SOFTCLIX LANCETS lancets CHECK BLOOD GLUCOSE THREE TIMES DAILY  . acetaminophen (TYLENOL) 500 MG tablet Take 500 mg by mouth every 8 (eight) hours as needed for mild pain or headache.  . Alcohol Swabs (ALCOHOL PREP) PADS USE TO TEST BLOOD GLUCOSE THREE TIMES DAILY  . amLODipine (NORVASC) 10 MG tablet Take 1 tablet (10 mg total) by mouth daily.  Marland Kitchen aspirin EC 81 MG tablet Take 81  mg by mouth at bedtime.  Marland Kitchen atorvastatin (LIPITOR) 80 MG tablet Take 1 tablet (80 mg total) by mouth daily at 6 PM.  . BD PEN NEEDLE NANO U/F 32G X 4 MM MISC INJECT TWO TIMES DAILY AS NEEDED.  . bisacodyl (DULCOLAX) 5 MG EC tablet Take 5 mg by mouth daily as needed for moderate constipation.  . Blood Glucose Monitoring Suppl (ACCU-CHEK AVIVA PLUS) w/Device KIT Use to check glucose levels  . carvedilol (COREG) 25 MG tablet Take 1 tablet (25 mg total) by mouth 2 (two) times daily with a meal.  . cloNIDine (CATAPRES) 0.1 MG tablet TAKE 1 TABLET (0.1 MG TOTAL) BY MOUTH AT BEDTIME.  . furosemide (LASIX) 80 MG tablet Take 1 tablet (80 mg total) by mouth daily.  Marland Kitchen glucose blood (ACCU-CHEK AVIVA PLUS) test strip USE TO TEST BLOOD GLUCOSE THREE TIMES DAILY  . hydrALAZINE (APRESOLINE) 25 MG tablet TAKE 1 TABLET THREE TIMES DAILY  . insulin aspart (NOVOLOG FLEXPEN) 100 UNIT/ML FlexPen Inject 14 Units into the skin 3 (three) times daily with meals.  . Insulin Glargine (LANTUS SOLOSTAR) 100 UNIT/ML Solostar Pen Inject 12 Units into the skin daily at 10 pm.  . isosorbide mononitrate (IMDUR) 60 MG 24 hr tablet TAKE 2 TABLETS EVERY DAY  . Lancet Devices (ACCU-CHEK SOFTCLIX) lancets USE TO TEST BLOOD GLUCOSE THREE TIMES DAILY  . latanoprost (XALATAN) 0.005 % ophthalmic solution Place 1 drop into both eyes at bedtime.  . nitroGLYCERIN (NITROSTAT) 0.4 MG SL tablet Place 1 tablet (0.4 mg total) under the tongue every 5 (five) minutes as needed for chest pain (3 doses MAX).  Marland Kitchen OVER THE COUNTER MEDICATION Take 1 tablet by mouth daily. Prostate formula  . pentoxifylline (TRENTAL) 400 MG CR tablet TAKE 1 TABLET (400 MG TOTAL) BY MOUTH 2 (TWO) TIMES DAILY. (TAKE AT 10 AM AND 5 PM)  . timolol (TIMOPTIC) 0.5 % ophthalmic solution Place 1 drop into both eyes every morning.  . TRAVATAN Z 0.004 % SOLN ophthalmic solution Place 1 drop into both eyes every evening.     Allergies:   Patient has no known allergies.   Social  History   Socioeconomic History  . Marital status: Widowed    Spouse name: Not on file  . Number of children: Not on file  . Years of education: Not on file  . Highest education level: Not on file  Occupational History  . Occupation: Retired  Tobacco Use  . Smoking status: Former Smoker    Packs/day: 0.50    Years: 20.00    Pack years: 10.00    Types: Cigarettes  . Smokeless tobacco: Never Used  . Tobacco comment: "quit smoking cigarettes in the 1960s   Substance and Sexual Activity  . Alcohol use: No  . Drug use: No  . Sexual activity: Not Currently  Other Topics Concern  . Not on file  Social History Narrative  Pt lives alone, family nearby.   Social Determinants of Health   Financial Resource Strain:   . Difficulty of Paying Living Expenses: Not on file  Food Insecurity:   . Worried About Charity fundraiser in the Last Year: Not on file  . Ran Out of Food in the Last Year: Not on file  Transportation Needs:   . Lack of Transportation (Medical): Not on file  . Lack of Transportation (Non-Medical): Not on file  Physical Activity:   . Days of Exercise per Week: Not on file  . Minutes of Exercise per Session: Not on file  Stress:   . Feeling of Stress : Not on file  Social Connections:   . Frequency of Communication with Friends and Family: Not on file  . Frequency of Social Gatherings with Friends and Family: Not on file  . Attends Religious Services: Not on file  . Active Member of Clubs or Organizations: Not on file  . Attends Archivist Meetings: Not on file  . Marital Status: Not on file     Family History: The patient's family history includes Cancer in his mother; Coronary artery disease in an other family member; Diabetes in an other family member; Heart attack in his sister; Hypertension in his mother and sister; Stroke in his mother. There is no history of Colon cancer.  ROS:   Please see the history of present illness.    Somewhat  depressed because of COVID-19.  As mentioned above, there is difficulty with appetite.  All other systems reviewed and are negative.  EKGs/Labs/Other Studies Reviewed:    The following studies were reviewed today: No new data  EKG:  EKG is not repeated today  Recent Labs: 08/11/2018: B Natriuretic Peptide 969.0 11/21/2018: ALT 14; BUN 61; Creatinine, Ser 4.52; Potassium 3.8; Sodium 140 02/07/2019: Hemoglobin 11.3; Platelets 188.0  Recent Lipid Panel    Component Value Date/Time   CHOL 120 11/21/2018 0958   TRIG 215 (H) 11/21/2018 0958   HDL 23 (L) 11/21/2018 0958   CHOLHDL 5.2 (H) 11/21/2018 0958   CHOLHDL 4 08/02/2017 1352   VLDL 34.2 08/02/2017 1352   LDLCALC 61 11/21/2018 0958   LDLDIRECT 93.3 01/22/2013 0854    Physical Exam:    VS:  BP 112/62   Pulse 66   Ht '5\' 10"'$  (1.778 m)   Wt 163 lb 6.4 oz (74.1 kg)   SpO2 98%   BMI 23.45 kg/m     Wt Readings from Last 3 Encounters:  02/20/19 163 lb 6.4 oz (74.1 kg)  02/15/19 162 lb 11.2 oz (73.8 kg)  02/05/19 163 lb 11.2 oz (74.3 kg)     GEN: Consistent with age appearing younger than 14.. No acute distress HEENT: Normal NECK: No JVD. LYMPHATICS: No lymphadenopathy CARDIAC:  RRR without murmur, gallop, or edema. VASCULAR:  Normal Pulses. No bruits. RESPIRATORY:  Clear to auscultation without rales, wheezing or rhonchi  ABDOMEN: Soft, non-tender, non-distended, No pulsatile mass, MUSCULOSKELETAL: No deformity  SKIN: Warm and dry NEUROLOGIC:  Alert and oriented x 3 PSYCHIATRIC:  Normal affect   ASSESSMENT:    1. Chronic combined systolic and diastolic heart failure ([place])   2. Coronary artery disease involving 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 ([place])   3. Dyslipidemia   4. CKD (chronic kidney disease), stage IV ([place])   5. Essential hypertension   6. Diabetes mellitus due to underlying condition with diabetic nephropathy, with long-term current use of insulin ([place])  28. Educated about COVID-19  virus infection    PLAN:    In order of problems listed above:  1. Stable without clinical evidence of volume overload 2. Stable without angina.  Basically living on one artery, the LIMA to the LAD.  No recent use of nitroglycerin. 3. LDL target less than 70.  Most recently was 43 in June 2020. 4. Stage IV-V CKD. 5. Blood pressure is excellently controlled. 6. Followed by primary care, Dr. Olam Idler Hernandez/Constant. 7. 3W's being followed.  Overall education and awareness concerning primary/secondary risk prevention was discussed in detail: LDL less than 70, hemoglobin A1c less than 7, blood pressure target less than 130/80 mmHg, >150 minutes of moderate aerobic activity per week, avoidance of smoking, weight control (via diet and exercise), and continued surveillance/management of/for obstructive sleep apnea.  We discussed CODE STATUS on next opportunity.  Patient has declared that he will not accept dialysis.  Medication Adjustments/Labs and Tests Ordered: Current medicines are reviewed at length with the patient today.  Concerns regarding medicines are outlined above.  No orders of the defined types were placed in this encounter.  No orders of the defined types were placed in this encounter.   Patient Instructions  Medication Instructions:  Your physician recommends that you continue on your current medications as directed. Please refer to the Current Medication list given to you today.  *If you need a refill on your cardiac medications before your next appointment, please call your pharmacy*  Lab Work: None If you have labs (blood work) drawn today and your tests are completely normal, you will receive your results only by: Marland Kitchen MyChart Message (if you have MyChart) OR . A paper copy in the mail If you have any lab test that is abnormal or we need to change your treatment, we will call you to review the results.  Testing/Procedures: None  Follow-Up: At [facility], you  and your health needs are our priority.  As part of our continuing mission to provide you with exceptional heart care, we have created designated Provider Care Teams.  These Care Teams include your primary Cardiologist (physician) and Advanced Practice Providers (APPs -  Physician Assistants and Nurse Practitioners) who all work together to provide you with the care you need, when you need it.  Your next appointment:   6 month(s)  The format for your next appointment:   In Person  Provider:   You may see [name], MD or one of the following Advanced Practice Providers on your designated Care Team:    Truitt Merle, NP  Cecilie Kicks, NP  Kathyrn Drown, NP   Other Instructions      Signed, [name], MD  02/20/2019 12:21 PM    [place]

## 2019-02-20 ENCOUNTER — Other Ambulatory Visit: Payer: Self-pay

## 2019-02-20 ENCOUNTER — Ambulatory Visit: Payer: Medicare HMO | Admitting: Interventional Cardiology

## 2019-02-20 ENCOUNTER — Encounter: Payer: Self-pay | Admitting: Interventional Cardiology

## 2019-02-20 VITALS — BP 112/62 | HR 66 | Ht 70.0 in | Wt 163.4 lb

## 2019-02-20 DIAGNOSIS — E785 Hyperlipidemia, unspecified: Secondary | ICD-10-CM | POA: Diagnosis not present

## 2019-02-20 DIAGNOSIS — Z7189 Other specified counseling: Secondary | ICD-10-CM | POA: Diagnosis not present

## 2019-02-20 DIAGNOSIS — Z794 Long term (current) use of insulin: Secondary | ICD-10-CM | POA: Diagnosis not present

## 2019-02-20 DIAGNOSIS — I5042 Chronic combined systolic (congestive) and diastolic (congestive) heart failure: Secondary | ICD-10-CM | POA: Diagnosis not present

## 2019-02-20 DIAGNOSIS — I110 Hypertensive heart disease with heart failure: Secondary | ICD-10-CM | POA: Diagnosis not present

## 2019-02-20 DIAGNOSIS — N184 Chronic kidney disease, stage 4 (severe): Secondary | ICD-10-CM | POA: Diagnosis not present

## 2019-02-20 DIAGNOSIS — I25709 Atherosclerosis of coronary artery bypass graft(s), unspecified, with unspecified angina pectoris: Secondary | ICD-10-CM | POA: Diagnosis not present

## 2019-02-20 DIAGNOSIS — I10 Essential (primary) hypertension: Secondary | ICD-10-CM

## 2019-02-20 DIAGNOSIS — E0821 Diabetes mellitus due to underlying condition with diabetic nephropathy: Secondary | ICD-10-CM | POA: Diagnosis not present

## 2019-02-20 NOTE — Patient Instructions (Signed)

## 2019-03-18 ENCOUNTER — Ambulatory Visit (INDEPENDENT_AMBULATORY_CARE_PROVIDER_SITE_OTHER): Payer: Medicare HMO

## 2019-03-18 ENCOUNTER — Other Ambulatory Visit: Payer: Self-pay

## 2019-03-18 DIAGNOSIS — E538 Deficiency of other specified B group vitamins: Secondary | ICD-10-CM | POA: Diagnosis not present

## 2019-03-18 MED ORDER — CYANOCOBALAMIN 1000 MCG/ML IJ SOLN: 1000.0000 ug | Freq: Once | INTRAMUSCULAR | 0 refills | Status: DC

## 2019-03-18 MED ORDER — CYANOCOBALAMIN 1000 MCG/ML IJ SOLN
1000.0000 ug | Freq: Once | INTRAMUSCULAR | Status: AC
  Administered 2019-03-18: 1000 ug via INTRAMUSCULAR

## 2019-03-18 NOTE — Progress Notes (Signed)
Pt tolerated shot well 

## 2019-03-27 ENCOUNTER — Encounter: Payer: Self-pay | Admitting: Gastroenterology

## 2019-03-27 DIAGNOSIS — N185 Chronic kidney disease, stage 5: Secondary | ICD-10-CM | POA: Diagnosis not present

## 2019-04-01 ENCOUNTER — Other Ambulatory Visit: Payer: Self-pay | Admitting: Internal Medicine

## 2019-04-04 DIAGNOSIS — D631 Anemia in chronic kidney disease: Secondary | ICD-10-CM | POA: Diagnosis not present

## 2019-04-04 DIAGNOSIS — E1151 Type 2 diabetes mellitus with diabetic peripheral angiopathy without gangrene: Secondary | ICD-10-CM | POA: Diagnosis not present

## 2019-04-04 DIAGNOSIS — N2581 Secondary hyperparathyroidism of renal origin: Secondary | ICD-10-CM | POA: Diagnosis not present

## 2019-04-04 DIAGNOSIS — I120 Hypertensive chronic kidney disease with stage 5 chronic kidney disease or end stage renal disease: Secondary | ICD-10-CM | POA: Diagnosis not present

## 2019-04-04 DIAGNOSIS — E611 Iron deficiency: Secondary | ICD-10-CM | POA: Diagnosis not present

## 2019-04-04 DIAGNOSIS — N185 Chronic kidney disease, stage 5: Secondary | ICD-10-CM | POA: Diagnosis not present

## 2019-04-10 ENCOUNTER — Other Ambulatory Visit: Payer: Self-pay | Admitting: Internal Medicine

## 2019-04-17 ENCOUNTER — Other Ambulatory Visit: Payer: Self-pay

## 2019-04-18 ENCOUNTER — Ambulatory Visit: Payer: Medicare HMO

## 2019-04-22 ENCOUNTER — Ambulatory Visit (INDEPENDENT_AMBULATORY_CARE_PROVIDER_SITE_OTHER): Payer: Medicare HMO | Admitting: *Deleted

## 2019-04-22 ENCOUNTER — Other Ambulatory Visit: Payer: Self-pay

## 2019-04-22 DIAGNOSIS — E538 Deficiency of other specified B group vitamins: Secondary | ICD-10-CM

## 2019-04-22 MED ORDER — CYANOCOBALAMIN 1000 MCG/ML IJ SOLN
1000.0000 ug | Freq: Once | INTRAMUSCULAR | Status: AC
  Administered 2019-04-22: 11:00:00 1000 ug via INTRAMUSCULAR

## 2019-04-22 NOTE — Progress Notes (Signed)
Per orders of Dr. Koberlein, injection of Cyanocobalamin 1000mcg given by Marikay Roads A. Patient tolerated injection well. 

## 2019-05-16 ENCOUNTER — Ambulatory Visit: Payer: Medicare HMO | Admitting: Internal Medicine

## 2019-05-17 ENCOUNTER — Other Ambulatory Visit: Payer: Self-pay

## 2019-05-20 ENCOUNTER — Ambulatory Visit (INDEPENDENT_AMBULATORY_CARE_PROVIDER_SITE_OTHER): Payer: Medicare HMO | Admitting: *Deleted

## 2019-05-20 DIAGNOSIS — E538 Deficiency of other specified B group vitamins: Secondary | ICD-10-CM

## 2019-05-20 MED ORDER — CYANOCOBALAMIN 1000 MCG/ML IJ SOLN
1000.0000 ug | Freq: Once | INTRAMUSCULAR | Status: AC
  Administered 2019-05-20: 1000 ug via INTRAMUSCULAR

## 2019-05-20 NOTE — Progress Notes (Signed)
Per orders of Dr. Burchette, injection of Cyanocobalamin 1000mcg given by Jarid Sasso A. Patient tolerated injection well.  

## 2019-05-22 ENCOUNTER — Other Ambulatory Visit: Payer: Self-pay | Admitting: Internal Medicine

## 2019-06-04 ENCOUNTER — Ambulatory Visit (INDEPENDENT_AMBULATORY_CARE_PROVIDER_SITE_OTHER): Payer: Medicare HMO | Admitting: Internal Medicine

## 2019-06-04 ENCOUNTER — Other Ambulatory Visit: Payer: Self-pay

## 2019-06-04 ENCOUNTER — Encounter: Payer: Self-pay | Admitting: Internal Medicine

## 2019-06-04 VITALS — BP 110/64 | HR 60 | Temp 97.6°F | Wt 161.4 lb

## 2019-06-04 DIAGNOSIS — I10 Essential (primary) hypertension: Secondary | ICD-10-CM

## 2019-06-04 DIAGNOSIS — E538 Deficiency of other specified B group vitamins: Secondary | ICD-10-CM | POA: Diagnosis not present

## 2019-06-04 DIAGNOSIS — E785 Hyperlipidemia, unspecified: Secondary | ICD-10-CM | POA: Diagnosis not present

## 2019-06-04 DIAGNOSIS — E1122 Type 2 diabetes mellitus with diabetic chronic kidney disease: Secondary | ICD-10-CM | POA: Diagnosis not present

## 2019-06-04 DIAGNOSIS — Z794 Long term (current) use of insulin: Secondary | ICD-10-CM | POA: Diagnosis not present

## 2019-06-04 DIAGNOSIS — N184 Chronic kidney disease, stage 4 (severe): Secondary | ICD-10-CM | POA: Diagnosis not present

## 2019-06-04 LAB — POCT GLYCOSYLATED HEMOGLOBIN (HGB A1C): Hemoglobin A1C: 5.7 % — AB (ref 4.0–5.6)

## 2019-06-04 MED ORDER — CYANOCOBALAMIN 1000 MCG/ML IJ SOLN
1000.0000 ug | Freq: Once | INTRAMUSCULAR | Status: AC
  Administered 2019-06-04: 1000 ug via INTRAMUSCULAR

## 2019-06-04 NOTE — Patient Instructions (Signed)
-  Nice seeing you today!!  -Check your blood sugar on those days you don't feel good. Contact the office if any numbers are below 70 after you eat a sugary snack.  -B12 injection today.  -Schedule follow up in 3 months.

## 2019-06-04 NOTE — Addendum Note (Signed)
Addended by: Westley Hummer B on: 06/04/2019 04:01 PM   Modules accepted: Orders

## 2019-06-04 NOTE — Progress Notes (Signed)
Established Patient Office Visit     This visit occurred during the SARS-CoV-2 public health emergency.  Safety protocols were in place, including screening questions prior to the visit, additional usage of staff PPE, and extensive cleaning of exam room while observing appropriate contact time as indicated for disinfecting solutions.    CC/Reason for Visit: 60-monthfollow-up chronic medical conditions  HPI: Brandon BRAMHALLis a 86y.o. male who is coming in today for the above mentioned reasons. Past Medical History is significant for: Chronic combined heart failure with a known ejection fraction of 30 to 35% and grade 2 diastolic dysfunction, hypertension that has been well controlled in the past, coronary artery disease, diabetes which is insulin-dependent,, chronic kidney disease stage IV, B12 deficiency. His coronary artery disease and chronic combined heart failure is well controlled, follows with cardiology routinely on a biannual to annual basis, his stage IV kidney disease is also followed by nephrology and has been stable (patient states he would never want to go on dialysis), his diabetes is well controlled on insulin.  He states that some days he "just does not feel good".  He cannot describe it anymore but just says he feels tired and sometimes lightheaded.  Feels better after he drinks Coca-Cola.  Other than this he has been feeling well and has no acute complaints, no changes since I last saw him.   Past Medical/Surgical History: Past Medical History:  Diagnosis Date  . Arthritis    "touch in my fingers" (09/10/2014)  . Atrophic gastritis   . AVM (arteriovenous malformation) of duodenum, acquired   . B12 deficiency anemia    "stopped taking the shots in ~ 06/2014" (09/10/2014)  . Barrett's esophagus   . CAD (coronary artery disease)    a. H/o MI in 1998;  b. s/p CABG 2003. c. 10/2014 NSTEMI/Cath: LM 75, LAD 1047m, D1 95, D2 50, LCX 100ost/p, OM1 80, OM2 80, RCA 100p/m,  RPDA fills via L->L collats, LIMA->LAD ok, VG->dRCA 100, VG->OM1->OM2 99 prox to OM1 insertion->recanalated ->Tx with heparin,  VG->D1 100-->Med Rx.  . Cataract   . Chronic systolic CHF (congestive heart failure) (HCC)    a. EF 40-45% in 10/2014 (previously normal in 08/2014).  . CKD (chronic kidney disease), stage IV (HCLake [place]  . Diabetes 1.5, managed as type 1 ([place]  . Hiatal hernia 2018   endoscopy -Dr. ArHavery Moros. History of stomach ulcers 2013  . Hyperlipidemia   . Hypertension   . Hypertensive heart disease   . Ischemic cardiomyopathy    a. 10/2014 Echo: EF 40-45%.  . Myocardial infarction ([place]CR[place]  . PVD (peripheral vascular disease) (HCC[place]  . Type II diabetes mellitus (HCP[place]    Past Surgical History:  Procedure Laterality Date  . CARDIAC CATHETERIZATION  2003;   . CARDIAC CATHETERIZATION N/A 11/07/2014   Left Heart Cath; HeBelva CromeMD;   . CATARACT EXTRACTION, BILATERAL Bilateral   . CHOLECYSTECTOMY N/A 05/20/2015   Procedure: LAPAROSCOPIC CHOLECYSTECTOMY;  Surgeon: DoCoralie KeensMD;  [facility] Service: General;  Laterality: N/A;  . COLONOSCOPY    . COLONOSCOPY N/A 05/12/2015   Procedure: COLONOSCOPY;  Surgeon: DaMilus BanisterMD;  [facility] Service: Endoscopy;  Laterality: N/A;  . [place] /nArchie Endo/15/2012  . CORONARY ARTERY BYPASS GRAFT  2003   CABG X5  . ESOPHAGOGASTRODUODENOSCOPY  06/10/2011   Procedure: ESOPHAGOGASTRODUODENOSCOPY (EGD);  Surgeon: MaLadene Artist  MD,FACG;  Location: [facility];  Service: Endoscopy;  Laterality: N/A;  . ESOPHAGOGASTRODUODENOSCOPY N/A 10/04/2016   Procedure: ESOPHAGOGASTRODUODENOSCOPY (EGD);  Surgeon: Manus Gunning, MD;  [facility];  Service: Gastroenterology;  Laterality: N/A;    Social History:  reports that he has quit smoking. His smoking use included cigarettes. He has a 10.00 pack-year smoking history. He has never used smokeless tobacco. He reports that he does  not drink alcohol or use drugs.  Allergies: No Known Allergies  Family History:  Family History  Problem Relation Age of Onset  . Stroke Mother   . Hypertension Mother   . Cancer Mother   . Diabetes Other        brothers and sisters  . Coronary artery disease Other   . Hypertension Sister   . Heart attack Sister   . Colon cancer Neg Hx      Current Outpatient Medications:  .  ACCU-CHEK SOFTCLIX LANCETS lancets, CHECK BLOOD GLUCOSE THREE TIMES DAILY, Disp: 100 each, Rfl: 0 .  acetaminophen (TYLENOL) 500 MG tablet, Take 500 mg by mouth every 8 (eight) hours as needed for mild pain or headache., Disp: , Rfl:  .  Alcohol Swabs (ALCOHOL PREP) PADS, USE TO TEST BLOOD GLUCOSE THREE TIMES DAILY, Disp: 300 each, Rfl: 3 .  amLODipine (NORVASC) 10 MG tablet, TAKE 1 TABLET EVERY DAY, Disp: 90 tablet, Rfl: 1 .  aspirin EC 81 MG tablet, Take 81 mg by mouth at bedtime., Disp: , Rfl:  .  atorvastatin (LIPITOR) 80 MG tablet, Take 1 tablet (80 mg total) by mouth daily at 6 PM., Disp: 90 tablet, Rfl: 3 .  BD PEN NEEDLE NANO U/F 32G X 4 MM MISC, INJECT TWO TIMES DAILY AS NEEDED., Disp: 180 each, Rfl: 3 .  bisacodyl (DULCOLAX) 5 MG EC tablet, Take 5 mg by mouth daily as needed for moderate constipation., Disp: , Rfl:  .  Blood Glucose Monitoring Suppl (ACCU-CHEK AVIVA PLUS) w/Device KIT, Use to check glucose levels, Disp: 1 kit, Rfl: 0 .  carvedilol (COREG) 25 MG tablet, Take 1 tablet (25 mg total) by mouth 2 (two) times daily with a meal., Disp: 180 tablet, Rfl: 1 .  cloNIDine (CATAPRES) 0.1 MG tablet, TAKE 1 TABLET AT BEDTIME, Disp: 90 tablet, Rfl: 1 .  COLCRYS 0.6 MG tablet, TAKE 1 TABLET EVERY DAY, Disp: 90 tablet, Rfl: 1 .  furosemide (LASIX) 80 MG tablet, Take 1 tablet (80 mg total) by mouth daily., Disp: 180 tablet, Rfl: 3 .  glucose blood (ACCU-CHEK AVIVA PLUS) test strip, USE TO TEST BLOOD GLUCOSE THREE TIMES DAILY, Disp: 300 each, Rfl: 3 .  hydrALAZINE (APRESOLINE) 25 MG tablet, TAKE 1 TABLET  THREE TIMES DAILY, Disp: 270 tablet, Rfl: 3 .  insulin aspart (NOVOLOG FLEXPEN) 100 UNIT/ML FlexPen, Inject 14 Units into the skin 3 (three) times daily with meals., Disp: , Rfl:  .  Insulin Glargine (LANTUS SOLOSTAR) 100 UNIT/ML Solostar Pen, Inject 12 Units into the skin daily at 10 pm., Disp: 15 mL, Rfl: 3 .  isosorbide mononitrate (IMDUR) 60 MG 24 hr tablet, TAKE 2 TABLETS EVERY DAY, Disp: 180 tablet, Rfl: 2 .  Lancet Devices (ACCU-CHEK SOFTCLIX) lancets, USE TO TEST BLOOD GLUCOSE THREE TIMES DAILY, Disp: 1 each, Rfl: 0 .  latanoprost (XALATAN) 0.005 % ophthalmic solution, Place 1 drop into both eyes at bedtime., Disp: , Rfl:  .  nitroGLYCERIN (NITROSTAT) 0.4 MG SL tablet, Place 1 tablet (0.4 mg total) under the tongue every 5 (five)  minutes as needed for chest pain (3 doses MAX)., Disp: 75 tablet, Rfl: 1 .  OVER THE COUNTER MEDICATION, Take 1 tablet by mouth daily. Prostate formula, Disp: , Rfl:  .  pentoxifylline (TRENTAL) 400 MG CR tablet, TAKE 1 TABLET (400 MG TOTAL) BY MOUTH 2 (TWO) TIMES DAILY. (TAKE AT 10 AM AND 5 PM), Disp: 180 tablet, Rfl: 1 .  timolol (TIMOPTIC) 0.5 % ophthalmic solution, Place 1 drop into both eyes every morning., Disp: , Rfl:  .  TRAVATAN Z 0.004 % SOLN ophthalmic solution, Place 1 drop into both eyes every evening., Disp: , Rfl:   Review of Systems:  Constitutional: Denies fever, chills, diaphoresis, appetite change and fatigue.  HEENT: Denies photophobia, eye pain, redness, hearing loss, ear pain, congestion, sore throat, rhinorrhea, sneezing, mouth sores, trouble swallowing, neck pain, neck stiffness and tinnitus.   Respiratory: Denies SOB, DOE, cough, chest tightness,  and wheezing.   Cardiovascular: Denies chest pain, palpitations and leg swelling.  Gastrointestinal: Denies nausea, vomiting, abdominal pain, diarrhea, constipation, blood in stool and abdominal distention.  Genitourinary: Denies dysuria, urgency, frequency, hematuria, flank pain and difficulty  urinating.  Endocrine: Denies: hot or cold intolerance, sweats, changes in hair or nails, polyuria, polydipsia. Musculoskeletal: Denies myalgias, back pain, joint swelling, arthralgias and gait problem.  Skin: Denies pallor, rash and wound.  Neurological: Denies dizziness, seizures, syncope, weakness, light-headedness, numbness and headaches.  Hematological: Denies adenopathy. Easy bruising, personal or family bleeding history  Psychiatric/Behavioral: Denies suicidal ideation, mood changes, confusion, nervousness, sleep disturbance and agitation    Physical Exam: Vitals:   06/04/19 1056  BP: 110/64  Pulse: 60  Temp: 97.6 F (36.4 C)  TempSrc: Temporal  SpO2: 98%  Weight: 161 lb 6.4 oz (73.2 kg)    Body mass index is 23.16 kg/m.   Constitutional: NAD, calm, comfortable Eyes: PERRL, lids and conjunctivae normal ENMT: Mucous membranes are moist. Respiratory: clear to auscultation bilaterally, no wheezing, no crackles. Normal respiratory effort. No accessory muscle use.  Cardiovascular: Regular rate and rhythm, no murmurs / rubs / gallops.  1+ bilateral lower extremity edema. Neurologic: Grossly intact and nonfocal Psychiatric: Normal judgment and insight. Alert and oriented x 3. Normal mood.    Impression and Plan:  Type 2 diabetes mellitus with stage 4 chronic kidney disease, with long-term current use of insulin (HCC) -A1c remains well controlled at 5.7 today. -I wonder if those episodes of fatigue and lightheadedness could be related to hypoglycemia. -I have asked him to check his blood sugars when he has these episodes and to contact the office immediately if they are below 70 after eating sugary snack.  B12 deficiency -To receive B12 injection in office today.  Dyslipidemia -Last LDL was at goal of less than 70 at 45 in 2019.  Essential hypertension -Well-controlled on current regimen.  CKD (chronic kidney disease), stage IV (HCC) -Creatinine has been stable at  3-3.2. -He follows routinely with nephrology, has told me that he would never want dialysis.    Patient Instructions  -Nice seeing you today!!  -Check your blood sugar on those days you don't feel good. Contact the office if any numbers are below 70 after you eat a sugary snack.  -B12 injection today.  -Schedule follow up in 3 months.     Lelon Frohlich, MD [facility]

## 2019-06-10 ENCOUNTER — Other Ambulatory Visit: Payer: Self-pay | Admitting: Internal Medicine

## 2019-06-18 ENCOUNTER — Other Ambulatory Visit: Payer: Self-pay

## 2019-06-18 MED ORDER — ISOSORBIDE MONONITRATE ER 60 MG PO TB24: 120.0000 mg | ORAL_TABLET | Freq: Every day | ORAL | 2 refills | Status: DC

## 2019-06-24 ENCOUNTER — Encounter: Payer: Self-pay | Admitting: Gastroenterology

## 2019-06-24 DIAGNOSIS — D631 Anemia in chronic kidney disease: Secondary | ICD-10-CM | POA: Diagnosis not present

## 2019-06-24 DIAGNOSIS — N185 Chronic kidney disease, stage 5: Secondary | ICD-10-CM | POA: Diagnosis not present

## 2019-07-03 ENCOUNTER — Other Ambulatory Visit: Payer: Self-pay

## 2019-07-03 DIAGNOSIS — E611 Iron deficiency: Secondary | ICD-10-CM | POA: Diagnosis not present

## 2019-07-03 DIAGNOSIS — N2581 Secondary hyperparathyroidism of renal origin: Secondary | ICD-10-CM | POA: Diagnosis not present

## 2019-07-03 DIAGNOSIS — I120 Hypertensive chronic kidney disease with stage 5 chronic kidney disease or end stage renal disease: Secondary | ICD-10-CM | POA: Diagnosis not present

## 2019-07-03 DIAGNOSIS — D631 Anemia in chronic kidney disease: Secondary | ICD-10-CM | POA: Diagnosis not present

## 2019-07-03 DIAGNOSIS — N185 Chronic kidney disease, stage 5: Secondary | ICD-10-CM | POA: Diagnosis not present

## 2019-07-04 ENCOUNTER — Ambulatory Visit (INDEPENDENT_AMBULATORY_CARE_PROVIDER_SITE_OTHER): Payer: Medicare HMO

## 2019-07-04 DIAGNOSIS — E538 Deficiency of other specified B group vitamins: Secondary | ICD-10-CM

## 2019-07-04 MED ORDER — CYANOCOBALAMIN 1000 MCG/ML IJ SOLN
1000.0000 ug | Freq: Once | INTRAMUSCULAR | Status: AC
  Administered 2019-07-04: 11:00:00 1000 ug via INTRAMUSCULAR

## 2019-07-04 NOTE — Progress Notes (Signed)
Per orders of Dr. Jerilee Hoh , injection of B12 given in R deltoid by Franco Collet. Patient tolerated injection well.

## 2019-07-04 NOTE — Patient Instructions (Signed)
Health Maintenance Due  Topic Date Due  . COVID-19 Vaccine (1) Never done  . FOOT EXAM  08/03/2018  . OPHTHALMOLOGY EXAM  04/17/2019    Depression screen [facility] 2/9 09/13/2018 09/13/2018 02/01/2018  Decreased Interest 0 0 0  Down, Depressed, Hopeless 0 0 0  PHQ - 2 Score 0 0 0  Altered sleeping 0 - -  Tired, decreased energy 0 - -  Change in appetite 0 - -  Feeling bad or failure about yourself  0 - -  Trouble concentrating 0 - -  Moving slowly or fidgety/restless 0 - -  Suicidal thoughts 0 - -  PHQ-9 Score 0 - -  Some recent data might be hidden

## 2019-07-09 ENCOUNTER — Encounter: Payer: Self-pay | Admitting: Gastroenterology

## 2019-07-09 ENCOUNTER — Ambulatory Visit: Payer: Medicare HMO | Admitting: Gastroenterology

## 2019-07-09 VITALS — BP 122/70 | HR 70 | Temp 97.2°F | Ht 70.0 in | Wt 161.4 lb

## 2019-07-09 DIAGNOSIS — D500 Iron deficiency anemia secondary to blood loss (chronic): Secondary | ICD-10-CM | POA: Diagnosis not present

## 2019-07-09 DIAGNOSIS — Z8774 Personal history of (corrected) congenital malformations of heart and circulatory system: Secondary | ICD-10-CM | POA: Diagnosis not present

## 2019-07-09 DIAGNOSIS — E119 Type 2 diabetes mellitus without complications: Secondary | ICD-10-CM | POA: Diagnosis not present

## 2019-07-09 DIAGNOSIS — Z794 Long term (current) use of insulin: Secondary | ICD-10-CM

## 2019-07-09 MED ORDER — OMEPRAZOLE 20 MG PO CPDR: 20.0000 mg | DELAYED_RELEASE_CAPSULE | Freq: Every day | ORAL | 4 refills | Status: DC

## 2019-07-09 NOTE — Progress Notes (Signed)
07/09/2019 [name] [id number] 15-Feb-1932   HISTORY OF PRESENT ILLNESS:  This is an 86 year old male who is a patient of Dr. Doyne Keel.  Has history of stage IV CKD, chronic anemia, CAD with cardiomyopathy and EF of 30-35%, and IDDM.  Has been followed here in the past for anemia and duodenal AVM's.  Hgb had been stable in the 11 gram range, last checked by [place] in December 2020, but then when recently checked by nephrology in April it was down to 8.9 grams.  Was sent back here to consider further evaluation.  He denies seeing black or bloody stools.  Was restarted on ferrous sulfate 325 mg TID and so stools are dark now because of that, but had not been dark prior to restarting that.  Iron levels as follows:  Iron saturation 9%, serum iron 30, ferritin 52.  Vitamin B12 and folate levels are normal.  Has baseline constipation and uses stool softeners daily and OTC laxatives prn.  Was previously on omeprazole 20 mg daily but is no longer taking that.  Endoscopic history: EGD 06/10/2011 - antral ulcer 69m in size. Biopsies negative for H pylori. Colonoscopy - 05/12/2015 - during GI bleed, extensive diverticulosis / hemorrhoids, thought to have left sided colonic bleed Colonoscopy 04/2008 - 49mpolyp, segmental colitis of left colon - unclear if related to diverticulosis, biopsies nonspecific EGD 10/04/16 - multiple duodenal AVMs, atrophic stomach, small hiatal hernia, irregular z-line - biopsies showed marked atrophic gastritis with intestinal metaplasia. Short segment BE without dysplasia. Biopsies negative for BE.  Past Medical History:  Diagnosis Date  . Arthritis    "touch in my fingers" (09/10/2014)  . Atrophic gastritis   . AVM (arteriovenous malformation) of duodenum, acquired   . B12 deficiency anemia    "stopped taking the shots in ~ 06/2014" (09/10/2014)  . Barrett's esophagus   . CAD (coronary artery disease)    a. H/o MI in 1998;  b. s/p CABG 2003. c. 10/2014 NSTEMI/Cath:  LM 75, LAD 10062m D1 95, D2 50, LCX 100ost/p, OM1 80, OM2 80, RCA 100p/m, RPDA fills via L->L collats, LIMA->LAD ok, VG->dRCA 100, VG->OM1->OM2 99 prox to OM1 insertion->recanalated ->Tx with heparin,  VG->D1 100-->Med Rx.  . Cataract   . Chronic systolic CHF (congestive heart failure) (HCC)    a. EF 40-45% in 10/2014 (previously normal in 08/2014).  . CKD (chronic kidney disease), stage IV ([place]CC[place] . Diabetes 1.5, [place] as type 1 ([place] . Hiatal hernia 2018   endoscopy -Dr. ArmHavery Moros History of stomach ulcers 2013  . Hyperlipidemia   . Hypertension   . Hypertensive heart disease   . Ischemic cardiomyopathy    a. 10/2014 Echo: EF 40-45%.  . Myocardial infarction ([place]CC[place] . PVD (peripheral vascular disease) (HCCR[place] . Type II diabetes mellitus (HCCHem[place]  Past Surgical History:  Procedure Laterality Date  . CARDIAC CATHETERIZATION  2003;   . CARDIAC CATHETERIZATION N/A 11/07/2014   Left Heart Cath; HenBelva CromeD;   . CATARACT EXTRACTION, BILATERAL Bilateral   . CHOLECYSTECTOMY N/A 05/20/2015   Procedure: LAPAROSCOPIC CHOLECYSTECTOMY;  Surgeon: DouCoralie KeensD;  [facility];  Laterality: N/A;  . COLONOSCOPY    . COLONOSCOPY N/A 05/12/2015   Procedure: COLONOSCOPY;  Surgeon: DanMilus BanisterD;  [facility] [place] [place];  Laterality: N/A;  . [place]noArchie Endo15/2012  . CORONARY  ARTERY BYPASS GRAFT  2003   CABG X5  . ESOPHAGOGASTRODUODENOSCOPY  06/10/2011   Procedure: ESOPHAGOGASTRODUODENOSCOPY (EGD);  Surgeon: Ladene Artist, MD,FACG;  Location[facility] ENDOSCOPY;  Service: Endoscopy;  Laterality: N/A;  . ESOPHAGOGASTRODUODENOSCOPY N/A 10/04/2016   Procedure: ESOPHAGOGASTRODUODENOSCOPY (EGD);  Surgeon: Manus Gunning, MD;  [facility];  Service: Gastroenterology;  Laterality: N/A;    reports that he has quit smoking. His smoking use included cigarettes. He has a 10.00 pack-year smoking history. He has  never used smokeless tobacco. He reports that he does not drink alcohol or use drugs. family history includes Cancer in his mother; Coronary artery disease in an other family member; Diabetes in an other family member; Heart attack in his sister; Hypertension in his mother and sister; Stroke in his mother. No Known Allergies    Outpatient Encounter Medications as of 07/09/2019  Medication Sig  . ACCU-CHEK SOFTCLIX LANCETS lancets CHECK BLOOD GLUCOSE THREE TIMES DAILY  . acetaminophen (TYLENOL) 500 MG tablet Take 500 mg by mouth every 8 (eight) hours as needed for mild pain or headache.  . Alcohol Swabs (ALCOHOL PREP) PADS USE TO TEST BLOOD GLUCOSE THREE TIMES DAILY  . amLODipine (NORVASC) 10 MG tablet TAKE 1 TABLET EVERY DAY  . aspirin EC 81 MG tablet Take 81 mg by mouth at bedtime.  Marland Kitchen atorvastatin (LIPITOR) 80 MG tablet Take 1 tablet (80 mg total) by mouth daily at 6 PM.  . BD PEN NEEDLE NANO U/F 32G X 4 MM MISC INJECT TWO TIMES DAILY AS NEEDED.  . bisacodyl (DULCOLAX) 5 MG EC tablet Take 5 mg by mouth daily as needed for moderate constipation.  . Blood Glucose Monitoring Suppl (ACCU-CHEK AVIVA PLUS) w/Device KIT Use to check glucose levels  . carvedilol (COREG) 25 MG tablet TAKE 1 TABLET TWICE DAILY WITH MEALS  . cloNIDine (CATAPRES) 0.1 MG tablet TAKE 1 TABLET AT BEDTIME  . COLCRYS 0.6 MG tablet TAKE 1 TABLET EVERY DAY  . furosemide (LASIX) 80 MG tablet Take 1 tablet (80 mg total) by mouth daily.  Marland Kitchen glucose blood (ACCU-CHEK AVIVA PLUS) test strip USE TO TEST BLOOD GLUCOSE THREE TIMES DAILY  . hydrALAZINE (APRESOLINE) 25 MG tablet TAKE 1 TABLET THREE TIMES DAILY  . insulin aspart (NOVOLOG FLEXPEN) 100 UNIT/ML FlexPen Inject 14 Units into the skin 3 (three) times daily with meals.  . Insulin Glargine (LANTUS SOLOSTAR) 100 UNIT/ML Solostar Pen Inject 12 Units into the skin daily at 10 pm.  . isosorbide mononitrate (IMDUR) 60 MG 24 hr tablet Take 2 tablets (120 mg total) by mouth daily.  Elmore Guise Devices (ACCU-CHEK SOFTCLIX) lancets USE TO TEST BLOOD GLUCOSE THREE TIMES DAILY  . latanoprost (XALATAN) 0.005 % ophthalmic solution Place 1 drop into both eyes at bedtime.  . nitroGLYCERIN (NITROSTAT) 0.4 MG SL tablet Place 1 tablet (0.4 mg total) under the tongue every 5 (five) minutes as needed for chest pain (3 doses MAX).  Marland Kitchen OVER THE COUNTER MEDICATION Take 1 tablet by mouth daily. Prostate formula  . pentoxifylline (TRENTAL) 400 MG CR tablet TAKE 1 TABLET (400 MG TOTAL) BY MOUTH 2 (TWO) TIMES DAILY. (TAKE AT 10 AM AND 5 PM)  . timolol (TIMOPTIC) 0.5 % ophthalmic solution Place 1 drop into both eyes every morning.  . TRAVATAN Z 0.004 % SOLN ophthalmic solution Place 1 drop into both eyes every evening.   No facility-administered encounter medications on file as of 07/09/2019.     REVIEW OF SYSTEMS  : All other systems  reviewed and negative except where noted in the History of Present Illness.   PHYSICAL EXAM: BP 122/70 (BP Location: Right Arm, Patient Position: Sitting, Cuff Size: Normal)   Pulse 70   Temp (!) 97.2 F (36.2 C) (Other (Comment))   Ht _0  (1.778 m)   Wt 161 lb 6 oz (73.2 kg)   BMI 23.15 kg/m  General: Well developed AA male in no acute distress Head: Normocephalic and atraumatic Eyes:  Sclerae anicteric, conjunctiva pink. Ears: Normal auditory acuity  Lungs: Clear throughout to auscultation; no increased WOB. Heart: Regular rate and rhythm. Abdomen: Soft, non-distended.  BS present.  Non-tender. Musculoskeletal: Symmetrical with no gross deformities  Skin: No lesions on visible extremities Extremities: No edema  Neurological: Alert oriented x 4, grossly non-focal Psychological:  Alert and cooperative. Normal mood and affect  ASSESSMENT AND PLAN: 86 year old male here for reassessment of the following issues:  Anemia - multifactorial due to CKD, duodenal AVMs  (cauterized in 2018), atrophic gastritis with B12 deficiency - Hgb previously was stable  in 11 gram range and recently down to 8.9 grams again.  Has been restarted on ferrous sulfate TID.  No sign of overt bleeding with black or bloody stools.  Barrett's esophagus- short segment noted on EGD in 2018.  Was previously on omeprazole 20 mg daily but is no longer taking that.  Will restart that and prescription will be sent to his [facility] pharmacy.  IDDM:  Insulin will be adjusted prior to endoscopic procedure per protocol. Will resume normal dosing after procedure.  **We discussed repeat EGD and possible colonoscopy as well.  He is agreeable to endoscopy but prefers to avoid colonoscopy for now due to his age.  I think that is probably ok.  Last colonoscopy was in 2017.  Previously had duodenal AVM's so recurrence of that is the most likely culprit although certainly could have colonic AVM's as well.  For now he is being scheduled for EGD at [facility] hospital due to low cardiac EF.   CC:  [name], [name]*

## 2019-07-09 NOTE — Progress Notes (Signed)
Agree with assessment with the following thoughts. Patient probably has a multifactorial anemia, perhaps driven mostly by stage IV CKD. If he has clear iron deficiency / microcytosis / or concern for blood in stools then would agree to proceed with EGD, although seems that is not the case. He has recently been placed back on oral iron and will see how he responds. He is higher than average risk for anesthesia with multiple comorbidities, and if he needs EGD we can proceed with it but would prefer to check CBC first and assess response to iron. If this improves his Hgb back to baseline levels with iron supplementation we may not need to proceed with EGD, I don't think we are missing any concerning malignant process, etc.  Jess can you please have the patient come back for a CBC to we can reassess severity of his anemia and will make decision regarding if and when he needs endoscopy. Thanks

## 2019-07-10 ENCOUNTER — Other Ambulatory Visit: Payer: Self-pay | Admitting: General Surgery

## 2019-07-10 ENCOUNTER — Telehealth: Payer: Self-pay | Admitting: General Surgery

## 2019-07-10 ENCOUNTER — Encounter: Payer: Self-pay | Admitting: General Surgery

## 2019-07-10 DIAGNOSIS — D649 Anemia, unspecified: Secondary | ICD-10-CM

## 2019-07-10 DIAGNOSIS — K31819 Angiodysplasia of stomach and duodenum without bleeding: Secondary | ICD-10-CM

## 2019-07-10 NOTE — Telephone Encounter (Signed)
Explained to the patient that we are going to hold off on his EGD and have labs drawn this week to check his CBC. The patient will come to the [facility] lab tomorrow, 07/11/2019. Explained that due to his age it might be better for now to observe, if the labs indicate otherwise Dr Havery Moros will decide how to proceed. The patient asked me to contact his son and give him the details of our conversation.

## 2019-07-10 NOTE — Telephone Encounter (Signed)
-----   Message from Loralie Champagne, PA-C sent at 07/09/2019  2:45 PM EDT ----- So Dr. Havery Moros came to me and wants [place] to see about him coming back to have a repeat CBC this week.  He says that due to his age he may be better with just observation, which I did discuss with the patient as well.  Please let him know to come have those labs done this week and pending those results we will decide how we are going to proceed.  Thank you,  Jess

## 2019-07-10 NOTE — Telephone Encounter (Signed)
Spoke with the patients son, [name] and explained to him the plan to have labs drawn tomorrow and then Dr Havery Moros will dedide what to do from there. [name] verbalized understanding and would desire a call back no later than Friday with a determination of what we are doing.

## 2019-07-11 ENCOUNTER — Other Ambulatory Visit (INDEPENDENT_AMBULATORY_CARE_PROVIDER_SITE_OTHER): Payer: Medicare HMO

## 2019-07-11 ENCOUNTER — Telehealth: Payer: Self-pay

## 2019-07-11 ENCOUNTER — Other Ambulatory Visit: Payer: Self-pay

## 2019-07-11 DIAGNOSIS — K31819 Angiodysplasia of stomach and duodenum without bleeding: Secondary | ICD-10-CM

## 2019-07-11 DIAGNOSIS — D649 Anemia, unspecified: Secondary | ICD-10-CM

## 2019-07-11 LAB — CBC WITH DIFFERENTIAL/PLATELET
Basophils Absolute: 0.1 10*3/uL (ref 0.0–0.1)
Basophils Relative: 0.8 % (ref 0.0–3.0)
Eosinophils Absolute: 0.7 10*3/uL (ref 0.0–0.7)
Eosinophils Relative: 10 % — ABNORMAL HIGH (ref 0.0–5.0)
HCT: 28.1 % — ABNORMAL LOW (ref 39.0–52.0)
Hemoglobin: 9.2 g/dL — ABNORMAL LOW (ref 13.0–17.0)
Lymphocytes Relative: 8.1 % — ABNORMAL LOW (ref 12.0–46.0)
Lymphs Abs: 0.5 10*3/uL — ABNORMAL LOW (ref 0.7–4.0)
MCHC: 32.8 g/dL (ref 30.0–36.0)
MCV: 88 fl (ref 78.0–100.0)
Monocytes Absolute: 0.5 10*3/uL (ref 0.1–1.0)
Monocytes Relative: 6.8 % (ref 3.0–12.0)
Neutro Abs: 5 10*3/uL (ref 1.4–7.7)
Neutrophils Relative %: 74.3 % (ref 43.0–77.0)
Platelets: 214 10*3/uL (ref 150.0–400.0)
RBC: 3.19 Mil/uL — ABNORMAL LOW (ref 4.22–5.81)
RDW: 19.1 % — ABNORMAL HIGH (ref 11.5–15.5)
WBC: 6.7 10*3/uL (ref 4.0–10.5)

## 2019-07-11 NOTE — Telephone Encounter (Signed)
[name] I passed the information on to the pt regarding labs and cancellation of EGD.  The pt son called and states he would like a personal call from you to discuss.  He says this is not making sense that the pt is fatigued and short of breath and nothing is being done.  He says he will be very upset if you do not call within the day.  I advised that you are seeing patients and I will pass this message on to you now.  He said that it seems that you are unreachable and asked when you are going to call.  I again said I can not give him a time for a call but will pass this on. The sons number is [phone] [name].

## 2019-07-11 NOTE — Telephone Encounter (Signed)
The pt son has been advised that neither Janett Billow or Dr Havery Moros are in the office today and will be contacted when back in the office tomorrow.

## 2019-07-12 ENCOUNTER — Telehealth: Payer: Self-pay | Admitting: Interventional Cardiology

## 2019-07-12 NOTE — Telephone Encounter (Signed)
Called and spoke with [name] for about 20 minutes. Explained the situation. He has what I suspect is multifactorial anemia - stage IV CKD, history of AVMs / autoimmune gastritis with mildly low iron level with normal ferritin. Colonoscopy is up to date without any significant pathology. Last EGD remarkable for small bowel AVMs, gastritis, short segment BE. Question is will repeat endoscopy be likely to improve his anemia or not - with his CKD he is at risk for further AVMs but he's not had any bleeding symptoms and MCV is normal. He has ongoing shortness of breath, seeing cardiology next week to have this evaluated. With his cardiac condition and age he is higher than average risk for anesthesia. He was placed on iron by his nephrologist and follow up CBC this week showed some improvement. I think it may be reasonable to watch him on oral iron and trend CBC and see how he does. If he does not improve or have evidence of bleeding symptoms would be okay to proceed with EGD if cardiology thinks he is stable for this. I discussed his history of Barrett's and autoimmune gastritis, this would normally lead to a 3 year follow up endoscopy, his last exam was 09/2016. At his age we would not normally pursue routine surveillance but if his anemia does not improve with iron we can consider doing this. Discussed risks / benefits of anesthesia and endoscopy with the son, it is unclear to me at present time if the EGD will benefit him and would agree that he should see the cardiologist first. Will await their evaluation, and then repeat his CBC next week. We will consider EGD / enteroscopy pending his course over the next 1-2 weeks. Son [name] agreed with the plan, I will order repeat CBC for next week and touch base with them with that result to discuss further plans. I will also touch base with the patient's PCP per Larry's request to discuss possible hematology consultation.    Patty can you please order CBC for this patient  to be drawn next week. Thanks

## 2019-07-12 NOTE — Telephone Encounter (Signed)
Lab order entered and pt and son aware to come in at his convenience next week.

## 2019-07-12 NOTE — Telephone Encounter (Signed)
Spoke with the pt and he reports that he has been weak, tired, and very poor appetite... he has been also having increased SOB with rest and minimal exertion.  He has been followed by GI for anemia and noted no active bleeding found.   Pt asked for me to call his son [name] in [place]. I spoke with [name] and we made him an ppt to see Richardson Dopp PA for his SOB to be sure it is not coming from added fluid in his body.. he is having some bilateral dependent edema.   I strongly urged him to reach out to the pts PCP about his continued low HGB and possible referral to Hematology if she feels would be appropriate.   Pts son thanked me for the call. I advised him that I will forward to Dr. Tamala Julian for his review and will call him back if he has any other recommendations.

## 2019-07-12 NOTE — Telephone Encounter (Signed)
New message   Pt c/o Shortness Of Breath: STAT if SOB developed within the last 24 hours or pt is noticeably SOB on the phone  1. Are you currently SOB (can you hear that pt is SOB on the phone)? Yes   2. How long have you been experiencing SOB?patient states that he has had for about 2 weeks   3. Are you SOB when sitting or when up moving around? Moving around   4. Are you currently experiencing any other symptoms? Weakness and no appetite

## 2019-07-13 NOTE — Telephone Encounter (Signed)
Thanks Ann. He has a combination of issues that could explain this. I worry than he may be developing more severe kidney failure or anemia. Needs CBC and CMET at OV.

## 2019-07-16 ENCOUNTER — Ambulatory Visit: Payer: Medicare HMO | Admitting: Physician Assistant

## 2019-07-16 ENCOUNTER — Telehealth: Payer: Self-pay | Admitting: Internal Medicine

## 2019-07-16 ENCOUNTER — Telehealth: Payer: Self-pay | Admitting: Gastroenterology

## 2019-07-16 ENCOUNTER — Other Ambulatory Visit: Payer: Self-pay

## 2019-07-16 ENCOUNTER — Encounter: Payer: Self-pay | Admitting: Physician Assistant

## 2019-07-16 VITALS — BP 150/60 | HR 64 | Ht 70.0 in | Wt 158.0 lb

## 2019-07-16 DIAGNOSIS — I5043 Acute on chronic combined systolic (congestive) and diastolic (congestive) heart failure: Secondary | ICD-10-CM

## 2019-07-16 DIAGNOSIS — E785 Hyperlipidemia, unspecified: Secondary | ICD-10-CM

## 2019-07-16 DIAGNOSIS — I25709 Atherosclerosis of coronary artery bypass graft(s), unspecified, with unspecified angina pectoris: Secondary | ICD-10-CM | POA: Diagnosis not present

## 2019-07-16 DIAGNOSIS — D509 Iron deficiency anemia, unspecified: Secondary | ICD-10-CM | POA: Diagnosis not present

## 2019-07-16 DIAGNOSIS — I10 Essential (primary) hypertension: Secondary | ICD-10-CM

## 2019-07-16 DIAGNOSIS — I493 Ventricular premature depolarization: Secondary | ICD-10-CM

## 2019-07-16 DIAGNOSIS — R0602 Shortness of breath: Secondary | ICD-10-CM

## 2019-07-16 DIAGNOSIS — N184 Chronic kidney disease, stage 4 (severe): Secondary | ICD-10-CM

## 2019-07-16 DIAGNOSIS — E1122 Type 2 diabetes mellitus with diabetic chronic kidney disease: Secondary | ICD-10-CM

## 2019-07-16 NOTE — Patient Instructions (Addendum)
Medication Instructions:   Your physician has recommended you make the following change in your medication:   1) Increase Lasix to 80 mg in the AM and 40 mg (0.5) tablet in the PM for 3 days, then resume 80 mg (1 tablet) once a day  *If you need a refill on your cardiac medications before your next appointment, please call your pharmacy*  Lab Work:  You will have labs drawn today: CMET/Magnesium/BNP/CBC  Testing/Procedures:  Your physician has requested that you have an echocardiogram. Echocardiography is a painless test that uses sound waves to create images of your heart. It provides your doctor with information about the size and shape of your heart and how well your heart's chambers and valves are working. This procedure takes approximately one hour. There are no restrictions for this procedure.  Follow-Up:  On 07/31/19 at 3:15PM with Kathyrn Drown, NP

## 2019-07-16 NOTE — Telephone Encounter (Signed)
An associate with [facility] called to inform that they will do pt's labs for CBC w/ Diff today as pt is going there for other blood work. They will send results.

## 2019-07-16 NOTE — Progress Notes (Signed)
Cardiology Office Note:    Date:  07/16/2019   ID:  [name], DOB [birth year], MRN [id number]  PCP:  [name], [name], MD  Cardiologist:  [name], MD   Electrophysiologist:  None   Referring MD: [name], [name]*   Chief Complaint:  Shortness of Breath    Patient Profile:    [name] is a 86 y.o. male with:   Coronary artery disease  S/p CABG in 2003  S/p non-STEMI in 9/16 >> LIMA-LAD patent, all SVG is occluded >> med Rx (due to CKD)  Combined systolic & diastolic CHF  Ischemic cardiomyopathy  Echo 9/16: EF 40-45  Echocardiogram 07/2018: EF 30-35, Gr 3 DD  Chronic kidney disease  Diabetes mellitus  Hypertension   Hyperlipidemia   Peripheral arterial disease  Peptic ulcer disease  History of lower GI bleeding (while on clopidogrel)  Admitted 04/2015; s/p transfusion with PRBCs x4  Hx of duodenal AVMs  S/p cholecystectomy in 04/2015  Prior CV studies: Echocardiogram 08/11/2018 EF 30-35, GR 3 DD, inferior, inferolateral, anterolateral AK, mild MR, trivial AI, mildly decreased RVSF, PASP 57  Echocardiogram 05/09/2016 EF 30-35, diffuse HK, inferolateral AK, GRII DD, mild AI, moderate LAE, PASP 41  Cardiac catheterization 10/2014 LM 75% LAD mid to distal 100%, D1 95%, 50% LCx ostial 100%, OM1 80%, OM to 80% RCA proximal 100%, mid 100%, RPDA filled by distal LAD LIMA-LAD patent SVG-RCA 100% SVG-OM1/OM2 99% proximal with heavy thrombus SVG-D1 100%  Echo 9/16 EF 40-45%, inferior akinesis, mild MR, PASP 46 mmHg  Myoview 7/16 Large, severe intensity mostly reversible anterior/anterolateral wall perfusion defect suggestive of ischemia. Moderate sized and intensity fixed inferior defect suggestive of scar. LVEF is 46% with severe inferior hypokinesis to akinesis. This is an abnormal, high risk study.   History of Present Illness:    Mr. Burandt was last seen by Dr. Tamala Julian in December 2020.  He called in  recently with complaints of fatigue, poor appetite and increasing shortness of breath.  He is currently followed by gastroenterology for iron deficiency anemia.  He was scheduled with an office visit for further evaluation.    He is here alone today.  However, he did call his son and he joined at the end of the visit by speaker phone.  Patient notes worsening shortness of breath over the past 2 weeks.  He notes that with walking for prolonged distance.  He sleeps on one pillow chronically.  He has noted some symptoms that sound consistent with paroxysmal nocturnal dyspnea.  His lower extremity swelling is stable.  He wears compression hose.  He has not had chest discomfort or syncope.  He has not had cough or wheezing.  His weights at home have been stable.  He notes a poor appetite.  Past Medical History:  Diagnosis Date  . Arthritis    "touch in my fingers" (09/10/2014)  . Atrophic gastritis   . AVM (arteriovenous malformation) of duodenum, acquired   . B12 deficiency anemia    "stopped taking the shots in ~ 06/2014" (09/10/2014)  . Barrett's esophagus   . CAD (coronary artery disease)    a. H/o MI in 1998;  b. s/p CABG 2003. c. 10/2014 NSTEMI/Cath: LM 75, LAD 179md, D1 95, D2 50, LCX 100ost/p, OM1 80, OM2 80, RCA 100p/m, RPDA fills via L->L collats, LIMA->LAD ok, VG->dRCA 100, VG->OM1->OM2 99 prox to OM1 insertion->recanalated ->Tx with heparin,  VG->D1 100-->Med Rx.  . Cataract   . Chronic systolic  CHF (congestive heart failure) ([place])    a. EF 40-45% in 10/2014 (previously normal in 08/2014).  . CKD (chronic kidney disease), stage IV ([place])   . Diabetes 1.5, managed as type 1 ([place])   . Hiatal hernia 2018   endoscopy -Dr. Havery Moros  . History of stomach ulcers 2013  . Hyperlipidemia   . Hypertension   . Hypertensive heart disease   . Ischemic cardiomyopathy    a. 10/2014 Echo: EF 40-45%.  . Myocardial infarction ([place])   . PVD (peripheral vascular disease) ([place])   . Type II diabetes  mellitus ([facility])     Current Medications: Current Meds  Medication Sig  . ACCU-CHEK SOFTCLIX LANCETS lancets CHECK BLOOD GLUCOSE THREE TIMES DAILY  . acetaminophen (TYLENOL) 500 MG tablet Take 500 mg by mouth every 8 (eight) hours as needed for mild pain or headache.  . Alcohol Swabs (ALCOHOL PREP) PADS USE TO TEST BLOOD GLUCOSE THREE TIMES DAILY  . amLODipine (NORVASC) 10 MG tablet TAKE 1 TABLET EVERY DAY  . aspirin EC 81 MG tablet Take 81 mg by mouth at bedtime.  Marland Kitchen atorvastatin (LIPITOR) 80 MG tablet Take 1 tablet (80 mg total) by mouth daily at 6 PM.  . BD PEN NEEDLE NANO U/F 32G X 4 MM MISC INJECT TWO TIMES DAILY AS NEEDED.  . bisacodyl (DULCOLAX) 5 MG EC tablet Take 5 mg by mouth daily as needed for moderate constipation.  . Blood Glucose Monitoring Suppl (ACCU-CHEK AVIVA PLUS) w/Device KIT Use to check glucose levels  . carvedilol (COREG) 25 MG tablet TAKE 1 TABLET TWICE DAILY WITH MEALS  . cloNIDine (CATAPRES) 0.1 MG tablet TAKE 1 TABLET AT BEDTIME  . COLCRYS 0.6 MG tablet TAKE 1 TABLET EVERY DAY  . furosemide (LASIX) 80 MG tablet Take 1 tablet (80 mg total) by mouth daily.  Marland Kitchen glucose blood (ACCU-CHEK AVIVA PLUS) test strip USE TO TEST BLOOD GLUCOSE THREE TIMES DAILY  . hydrALAZINE (APRESOLINE) 25 MG tablet TAKE 1 TABLET THREE TIMES DAILY  . insulin aspart (NOVOLOG FLEXPEN) 100 UNIT/ML FlexPen Inject 14 Units into the skin 3 (three) times daily with meals.  . Insulin Glargine (LANTUS SOLOSTAR) 100 UNIT/ML Solostar Pen Inject 12 Units into the skin daily at 10 pm.  . isosorbide mononitrate (IMDUR) 60 MG 24 hr tablet Take 2 tablets (120 mg total) by mouth daily.  Elmore Guise Devices (ACCU-CHEK SOFTCLIX) lancets USE TO TEST BLOOD GLUCOSE THREE TIMES DAILY  . latanoprost (XALATAN) 0.005 % ophthalmic solution Place 1 drop into both eyes at bedtime.  . nitroGLYCERIN (NITROSTAT) 0.4 MG SL tablet Place 1 tablet (0.4 mg total) under the tongue every 5 (five) minutes as needed for chest pain (3  doses MAX).  Marland Kitchen omeprazole (PRILOSEC) 20 MG capsule Take 1 capsule (20 mg total) by mouth daily.  Marland Kitchen OVER THE COUNTER MEDICATION Take 1 tablet by mouth daily. Prostate formula  . pentoxifylline (TRENTAL) 400 MG CR tablet TAKE 1 TABLET (400 MG TOTAL) BY MOUTH 2 (TWO) TIMES DAILY. (TAKE AT 10 AM AND 5 PM)  . timolol (TIMOPTIC) 0.5 % ophthalmic solution Place 1 drop into both eyes every morning.  . TRAVATAN Z 0.004 % SOLN ophthalmic solution Place 1 drop into both eyes every evening.     Allergies:   Patient has no known allergies.   Social History   Tobacco Use  . Smoking status: Former Smoker    Packs/day: 0.50    Years: 20.00    Pack years: 10.00  Types: Cigarettes  . Smokeless tobacco: Never Used  . Tobacco comment: "quit smoking cigarettes in the 1960s   Substance Use Topics  . Alcohol use: No  . Drug use: No     Family Hx: The patient's family history includes Cancer in his mother; Coronary artery disease in an other family member; Diabetes in an other family member; Heart attack in his sister; Hypertension in his mother and sister; Stroke in his mother. There is no history of Colon cancer.  ROS   EKGs/Labs/Other Test Reviewed:    EKG:  EKG is  ordered today.  The ekg ordered today demonstrates normal sinus rhythm, heart rate 64, left axis deviation, first-degree AV block (PR 264 ms), T wave inversions 1, aVL, PVCs, nonspecific ST-T wave changes, similar to prior tracings  Recent Labs: 08/11/2018: B Natriuretic Peptide 969.0 11/21/2018: ALT 14; BUN 61; Creatinine, Ser 4.52; Potassium 3.8; Sodium 140 07/11/2019: Hemoglobin 9.2; Platelets 214.0   Notes from GI indicate a Hgb (drawn by Nephrology) in April 2021 was 8.9  Recent Lipid Panel Lab Results  Component Value Date/Time   CHOL 120 11/21/2018 09:58 AM   TRIG 215 (H) 11/21/2018 09:58 AM   HDL 23 (L) 11/21/2018 09:58 AM   CHOLHDL 5.2 (H) 11/21/2018 09:58 AM   CHOLHDL 4 08/02/2017 01:52 PM   LDLCALC 61 11/21/2018  09:58 AM   LDLDIRECT 93.3 01/22/2013 08:54 AM    Physical Exam:    VS:  BP (!) 150/60   Pulse 64   Ht 5' 10"  (1.778 m)   Wt 158 lb (71.7 kg)   SpO2 96%   BMI 22.67 kg/m     Wt Readings from Last 3 Encounters:  07/16/19 158 lb (71.7 kg)  07/09/19 161 lb 6 oz (73.2 kg)  06/04/19 161 lb 6.4 oz (73.2 kg)     Constitutional:      Appearance: Healthy appearance. Not in distress.  Neck:     Thyroid: No thyromegaly.     Vascular: JVD elevated.  Pulmonary:     Effort: Pulmonary effort is normal.     Breath sounds: No wheezing. Bibasilar Rales (very faint rales at the bases bilat) present.  Cardiovascular:     Normal rate. Regular rhythm. Normal S1. Normal S2.     Murmurs: There is a grade 2/6 holosystolic murmur at the LLSB and ULSB.  Edema:    Pretibial: bilateral 2+ edema of the pretibial area.    Ankle: bilateral 2+ edema of the ankle. Abdominal:     General: There is distension.  Skin:    General: Skin is warm and dry.  Neurological:     General: No focal deficit present.     Mental Status: Alert and oriented to person, place and time.     Cranial Nerves: Cranial nerves are intact.       ASSESSMENT & PLAN:    1. Acute on chronic combined systolic and diastolic CHF (congestive heart failure) ([employer]) 2. Shortness of breath I suspect his shortness of breath is multifactorial and related to congestive heart failure, coronary artery disease, deconditioning and anemia.  However, he does have some evidence of volume excess on exam.  His heart failure is impacted by his chronic kidney disease as well as his anemia.  I have recommended that we increase his furosemide for at least 3 days.  If his symptoms are not significantly improved with this or if laboratory evidence suggests otherwise, we will continue higher dose of furosemide for longer.  -Increase  furosemide to 80 mg in AM and 40 mg in PM x 3 days  -Obtain CMET, CBC, Mg2+, BNP today  -If BNP significantly elevated, continue  higher dose furosemide for longer  -He knows to contact us if his shortness of breath is not significantly improved after 3 days  -Obtain follow-up echocardiogram  -Follow-up in 2 weeks with Dr. Tamala Julian or APP  3. PVC's (premature ventricular contractions) He has several PVCs noted on electrocardiogram today.  Obtain comprehensive metabolic panel and magnesium level today.  Obtain echocardiogram as noted above.  If his EF is down on echocardiogram, consider 24-hour Holter to assess PVC burden.  4. Coronary artery disease involving 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 ([place]) History of CABG in 2003.  He is status post non-STEMI in September 2016.  All of his vein grafts were occluded at that time.  His LIMA-LAD remains patent.  He has been managed medically.  He is not currently having significant anginal symptoms.  Continue current dose of amlodipine, aspirin, atorvastatin, carvedilol, isosorbide.  5. CKD (chronic kidney disease), stage IV ([place]) He has managed by Dr. Carmina Miller at [facility].  Obtain follow-up comprehensive metabolic panel as outlined above.  He will need another basic metabolic panel at follow-up to reassess renal function and potassium.  6. Essential hypertension Blood pressure is above target.  Adjust furosemide first.  At follow-up, if his blood pressure remains above target, consider increasing his hydralazine.  7. Iron deficiency anemia, unspecified iron deficiency anemia type He is followed by gastroenterology.  He is due for a follow-up CBC this week.  As I am obtaining blood work today, I will draw a CBC and forwarded to his gastroenterologist.    Dispo:  Return in about 2 weeks (around 07/30/2019) for Close Follow Up w/ Dr. Tamala Julian, or PA/NP, in person.   Medication Adjustments/Labs and Tests Ordered: Current medicines are reviewed at length with the patient today.  Concerns regarding medicines are outlined above.  Tests Ordered: Orders Placed  This Encounter  Procedures  . Comprehensive metabolic panel  . CBC  . Pro b natriuretic peptide (BNP)  . Magnesium  . CBC w/Diff  . EKG 12-Lead  . ECHOCARDIOGRAM COMPLETE   Medication Changes: No orders of the defined types were placed in this encounter.   Signed, Richardson Dopp, PA-C  07/16/2019 5:51 PM    [facility] [address], [place], [place]  [address] Phone: [phone]; Fax: [phone]

## 2019-07-16 NOTE — Chronic Care Management (AMB) (Signed)
°  Chronic Care Management   Note  07/16/2019 Name: JOVAUN LEVENE MRN: [id number] DOB: 1931/12/09  BRENIN HEIDELBERGER is a 86 y.o. year old male who is a primary care patient of [name], [name], MD. I reached out to [name] by phone today in response to a referral sent by Mr. VIPUL CAFARELLI PCP, [name], [name], MD.   Mr. Culbreath was given information about Chronic Care Management services today including:  1. CCM service includes personalized support from designated clinical staff supervised by his physician, including individualized plan of care and coordination with other care providers 2. 24/7 contact phone numbers for assistance for urgent and routine care needs. 3. Service will only be billed when office clinical staff spend 20 minutes or more in a month to coordinate care. 4. Only one practitioner may furnish and bill the service in a calendar month. 5. The patient may stop CCM services at any time (effective at the end of the month) by phone call to the office staff.   Patient agreed to services and verbal consent obtained.   Follow up plan:   [place]

## 2019-07-16 NOTE — Telephone Encounter (Signed)
Noted  

## 2019-07-17 ENCOUNTER — Telehealth: Payer: Self-pay | Admitting: Gastroenterology

## 2019-07-17 ENCOUNTER — Other Ambulatory Visit: Payer: Self-pay

## 2019-07-17 DIAGNOSIS — D500 Iron deficiency anemia secondary to blood loss (chronic): Secondary | ICD-10-CM

## 2019-07-17 LAB — COMPREHENSIVE METABOLIC PANEL
ALT: 14 IU/L (ref 0–44)
AST: 16 IU/L (ref 0–40)
Albumin/Globulin Ratio: 1.4 (ref 1.2–2.2)
Albumin: 4.2 g/dL (ref 3.6–4.6)
Alkaline Phosphatase: 105 IU/L (ref 48–121)
BUN/Creatinine Ratio: 13 (ref 10–24)
BUN: 41 mg/dL — ABNORMAL HIGH (ref 8–27)
Bilirubin Total: 0.4 mg/dL (ref 0.0–1.2)
CO2: 21 mmol/L (ref 20–29)
Calcium: 9.5 mg/dL (ref 8.6–10.2)
Chloride: 108 mmol/L — ABNORMAL HIGH (ref 96–106)
Creatinine, Ser: 3.28 mg/dL — ABNORMAL HIGH (ref 0.76–1.27)
GFR calc Af Amer: 19 mL/min/{1.73_m2} — ABNORMAL LOW (ref >59)
GFR calc non Af Amer: 16 mL/min/{1.73_m2} — ABNORMAL LOW (ref >59)
Globulin, Total: 3 g/dL (ref 1.5–4.5)
Glucose: 131 mg/dL — ABNORMAL HIGH (ref 65–99)
Potassium: 4.2 mmol/L (ref 3.5–5.2)
Sodium: 144 mmol/L (ref 134–144)
Total Protein: 7.2 g/dL (ref 6.0–8.5)

## 2019-07-17 LAB — CBC WITH DIFFERENTIAL/PLATELET
Basophils Absolute: 0.1 10*3/uL (ref 0.0–0.2)
Basos: 1 %
EOS (ABSOLUTE): 0.7 10*3/uL — ABNORMAL HIGH (ref 0.0–0.4)
Eos: 11 %
Hematocrit: 30.3 % — ABNORMAL LOW (ref 37.5–51.0)
Hemoglobin: 9.8 g/dL — ABNORMAL LOW (ref 13.0–17.7)
Immature Grans (Abs): 0 10*3/uL (ref 0.0–0.1)
Immature Granulocytes: 1 %
Lymphocytes Absolute: 0.7 10*3/uL (ref 0.7–3.1)
Lymphs: 10 %
MCH: 28.2 pg (ref 26.6–33.0)
MCHC: 32.3 g/dL (ref 31.5–35.7)
MCV: 87 fL (ref 79–97)
Monocytes Absolute: 0.5 10*3/uL (ref 0.1–0.9)
Monocytes: 7 %
Neutrophils Absolute: 4.4 10*3/uL (ref 1.4–7.0)
Neutrophils: 70 %
Platelets: 254 10*3/uL (ref 150–450)
RBC: 3.47 x10E6/uL — ABNORMAL LOW (ref 4.14–5.80)
RDW: 16.7 % — ABNORMAL HIGH (ref 11.6–15.4)
WBC: 6.4 10*3/uL (ref 3.4–10.8)

## 2019-07-17 LAB — PRO B NATRIURETIC PEPTIDE: NT-Pro BNP: 11582 pg/mL — ABNORMAL HIGH (ref 0–486)

## 2019-07-17 LAB — MAGNESIUM: Magnesium: 2.4 mg/dL — ABNORMAL HIGH (ref 1.6–2.3)

## 2019-07-17 NOTE — Telephone Encounter (Signed)
Please see my OV note from yesterday. Richardson Dopp, PA-C    07/17/2019 4:51 PM

## 2019-07-17 NOTE — Telephone Encounter (Signed)
Called the patient's son [name]. I heard from the cardiology clinic, they are increasing his lasix and doing an Echo. His BNP is quite high and treating him for heart failure. Fortunately his Hgb continues to improve with the iron, now at 9.8.   I will await his course with change in diuretics and his echo result in the next few weeks and will then reassess. If his Hgb continues to improve on iron we may not pursue endoscopic evaluation. However pending his course with cardiology, if his heart function is okay and anemia does not improve or worsens we may consider EGD. Would like to repeat his CBC again in 3 weeks or so, and will touch base with them again at that time. He agreed.   Jan can you please place a recall for CBC in 3 weeks. Thanks

## 2019-07-17 NOTE — Progress Notes (Signed)
See result note.  Pt needs cbc in 3 weeks around 08-07-19

## 2019-07-18 ENCOUNTER — Other Ambulatory Visit: Payer: Self-pay

## 2019-07-18 DIAGNOSIS — I5043 Acute on chronic combined systolic (congestive) and diastolic (congestive) heart failure: Secondary | ICD-10-CM

## 2019-07-18 DIAGNOSIS — R0602 Shortness of breath: Secondary | ICD-10-CM

## 2019-07-21 NOTE — Telephone Encounter (Signed)
All is stable except HF is worse. Agree with need diuresis and chronic more intense diuretic regimen. Need to keep nephrology informed. BMET one week after increase diuretic regimen.

## 2019-07-21 NOTE — Progress Notes (Signed)
Cardiology Office Note   Date:  07/31/2019   ID:  [name], DOB [birth year], MRN [id number]  PCP:  [name], Brandon Halsted, MD  Cardiologist:  Dr. Tamala Julian, MD   Chief Complaint  Patient presents with  . Congestive Heart Failure  . Follow-up    History of Present Illness: [name] is a 86 y.o. male who presents for 2 week f/u, seen for Dr. Tamala Holt.   [name] has a history of CAD s/p CABG in 2003, NSTEMI 10/2014 with patent LIMA to LAD and occluded SVG with recommendations for medical treatment due to CKD, chronic combined systolic and diastolic CHF/ischemic cardiomyopathy with last echocardiogram 07/2018 with EF at 30 to 35% with grade 3 DD, CKD stage III, DM 2, hypertension, hyperlipidemia, peripheral arterial disease, peptic ulcer disease with a history of lower GI bleed while on clopidogrel, history of duodenal AVMs.   He was seen by Dr. Tamala Holt 01/2019 and was doing well however called our office with complaints of fatigue, poor appetite and increasing shortness of breath.  Noted to be currently followed by gastroenterology for iron deficiency anemia.   He was subsequently seen by Richardson Dopp, PA at which time he noted a 2-week history of worsening shortness of breath noted with walking for prolonged distances.  He had no anginal symptoms. Did have evidence of fluid volume overload on exam therefore recommendations were to increase his furosemide at 80 mg in the a.m. and 40 mg in the p.m. for 3 days along with lab work including BNP.  Creatinine found to be markedly elevated at 3.28 (baseline 3.3-4.5 range) with a BNP at 115[address].  Given BNP elevation plan was for Lasix 80 mg twice daily x 3 days. After 3 days, reduce Lasix to 80 mg in Am and 40 mg in PM and continue, BMET 1 week.   Echocardiogram scheduled for 08/05/2019.   Today he states that he is feeling much better. He has no further SOB or LE edema.Has been unable to start [place] due to mail in pharmacy. His K+  was found to be 3.1 in follow up. We discussed send 1 weeks worth of KDUR to local pharm and he is agreeable. He is currently on Lasix 33m PO QD for diuresis. Creatinine remains elevated however he is at his baseline. Saw nephrology last month. Denies chest pain, palpitations, SOB, PND, orthopnea,  dizziness or syncope.   Past Medical History:  Diagnosis Date  . Arthritis    "touch in my fingers" (09/10/2014)  . Atrophic gastritis   . AVM (arteriovenous malformation) of duodenum, acquired   . B12 deficiency anemia    "stopped taking the shots in ~ 06/2014" (09/10/2014)  . Barrett's esophagus   . CAD (coronary artery disease)    a. H/o MI in 1998;  b. s/p CABG 2003. c. 10/2014 NSTEMI/Cath: LM 75, LAD 1070m, D1 95, D2 50, LCX 100ost/p, OM1 80, OM2 80, RCA 100p/m, RPDA fills via L->L collats, LIMA->LAD ok, VG->dRCA 100, VG->OM1->OM2 99 prox to OM1 insertion->recanalated ->Tx with heparin,  VG->D1 100-->Med Rx.  . Cataract   . Chronic systolic CHF (congestive heart failure) (HCC)    a. EF 40-45% in 10/2014 (previously normal in 08/2014).  . CKD (chronic kidney disease), stage IV ([facility]  . Diabetes 1.5, managed as type 1 ([facility]  . Hiatal hernia 2018   endoscopy -Dr. ArHavery Moros. History of stomach ulcers 2013  . Hyperlipidemia   . Hypertension   .  Hypertensive heart disease   . Ischemic cardiomyopathy    a. 10/2014 Echo: EF 40-45%.  . Myocardial infarction ([place])   . PVD (peripheral vascular disease) ([place])   . Type II diabetes mellitus ([place])     Past Surgical History:  Procedure Laterality Date  . CARDIAC CATHETERIZATION  2003;   . CARDIAC CATHETERIZATION N/A 11/07/2014   Left Heart Cath; Belva Crome, MD;   . CATARACT EXTRACTION, BILATERAL Bilateral   . CHOLECYSTECTOMY N/A 05/20/2015   Procedure: LAPAROSCOPIC CHOLECYSTECTOMY;  Surgeon: Brandon Keens, MD;  Location: [facility];  Service: General;  Laterality: N/A;  . COLONOSCOPY    . COLONOSCOPY N/A 05/12/2015   Procedure: COLONOSCOPY;   Surgeon: Milus Banister, MD;  Location: [facility];  Service: Endoscopy;  Laterality: N/A;  . Holt   Brandon Endo 07/13/2010  . CORONARY ARTERY BYPASS GRAFT  2003   CABG X5  . ESOPHAGOGASTRODUODENOSCOPY  06/10/2011   Procedure: ESOPHAGOGASTRODUODENOSCOPY (EGD);  Surgeon: Brandon Artist, MD,FACG;  [facility] ENDOSCOPY;  Service: Endoscopy;  Laterality: N/A;  . ESOPHAGOGASTRODUODENOSCOPY N/A 10/04/2016   Procedure: ESOPHAGOGASTRODUODENOSCOPY (EGD);  Surgeon: Brandon Gunning, MD;  Location: [facility] ENDOSCOPY;  Service: Gastroenterology;  Laterality: N/A;     Current Outpatient Medications  Medication Sig Dispense Refill  . ACCU-CHEK SOFTCLIX LANCETS lancets CHECK BLOOD GLUCOSE THREE TIMES DAILY 100 each 0  . acetaminophen (TYLENOL) 500 MG tablet Take 500 mg by mouth every 8 (eight) hours as needed for mild pain or headache.    . Alcohol Swabs (ALCOHOL PREP) PADS USE TO TEST BLOOD GLUCOSE THREE TIMES DAILY 300 each 3  . amLODipine (NORVASC) 10 MG tablet TAKE 1 TABLET EVERY DAY 90 tablet 1  . aspirin EC 81 MG tablet Take 81 mg by mouth at bedtime.    Marland Kitchen atorvastatin (LIPITOR) 80 MG tablet Take 1 tablet (80 mg total) by mouth daily at 6 PM. 90 tablet 3  . BD PEN NEEDLE NANO U/F 32G X 4 MM MISC INJECT TWO TIMES DAILY AS NEEDED. 180 each 3  . bisacodyl (DULCOLAX) 5 MG EC tablet Take 5 mg by mouth daily as needed for moderate constipation.    . Blood Glucose Monitoring Suppl (ACCU-CHEK AVIVA PLUS) w/Device KIT Use to check glucose levels 1 kit 0  . carvedilol (COREG) 25 MG tablet TAKE 1 TABLET TWICE DAILY WITH MEALS 180 tablet 1  . cloNIDine (CATAPRES) 0.1 MG tablet TAKE 1 TABLET AT BEDTIME 90 tablet 1  . COLCRYS 0.6 MG tablet TAKE 1 TABLET EVERY DAY 90 tablet 1  . furosemide (LASIX) 80 MG tablet Take 1 tablet (80 mg total) by mouth daily. 180 tablet 3  . glucose blood (ACCU-CHEK AVIVA PLUS) test strip USE TO TEST BLOOD GLUCOSE THREE TIMES DAILY 300 each 3  . hydrALAZINE  (APRESOLINE) 25 MG tablet TAKE 1 TABLET THREE TIMES DAILY 270 tablet 3  . insulin aspart (NOVOLOG FLEXPEN) 100 UNIT/ML FlexPen Inject 14 Units into the skin 3 (three) times daily with meals.    . Insulin Glargine (LANTUS SOLOSTAR) 100 UNIT/ML Solostar Pen Inject 12 Units into the skin daily at 10 pm. 15 mL 3  . isosorbide mononitrate (IMDUR) 60 MG 24 hr tablet Take 2 tablets (120 mg total) by mouth daily. 180 tablet 2  . Lancet Devices (ACCU-CHEK SOFTCLIX) lancets USE TO TEST BLOOD GLUCOSE THREE TIMES DAILY 1 each 0  . latanoprost (XALATAN) 0.005 % ophthalmic solution Place 1 drop into both eyes at bedtime.    Marland Kitchen  nitroGLYCERIN (NITROSTAT) 0.4 MG SL tablet Place 1 tablet (0.4 mg total) under the tongue every 5 (five) minutes as needed for chest pain (3 doses MAX). 75 tablet 1  . omeprazole (PRILOSEC) 20 MG capsule Take 1 capsule (20 mg total) by mouth daily. 90 capsule 4  . OVER THE COUNTER MEDICATION Take 1 tablet by mouth daily. Prostate formula    . pentoxifylline (TRENTAL) 400 MG CR tablet TAKE 1 TABLET (400 MG TOTAL) BY MOUTH 2 (TWO) TIMES DAILY. (TAKE AT 10 AM AND 5 PM) 180 tablet 1  . potassium chloride SA (KLOR-CON) 20 MEQ tablet Take 2 tablets by mouth for the first dose, then take 1 tablet by mouth once a day 10 tablet 0   No current facility-administered medications for this visit.    Allergies:   Patient has no known allergies.    Social History:  The patient  reports that he has quit smoking. His smoking use included cigarettes. He has a 10.00 pack-year smoking history. He has never used smokeless tobacco. He reports that he does not drink alcohol or use drugs.   Family History:  The patient's family history includes Cancer in his mother; Coronary artery disease in an other family member; Diabetes in an other family member; Heart attack in his sister; Hypertension in his mother and sister; Stroke in his mother.    ROS:  Please see the history of present illness.   Otherwise, review  of systems are positive for none.  All other systems are reviewed and negative.    PHYSICAL EXAM: VS:  BP 124/62   Pulse (!) 56   Ht 5' 10"  (1.778 m)   Wt 152 lb (68.9 kg)   SpO2 99%   BMI 21.81 kg/m  , BMI Body mass index is 21.81 kg/m.   General: Well developed, well nourished, NAD Neck: Negative for carotid bruits. No JVD Lungs:Clear to ausculation bilaterally. No wheezes, rales, or rhonchi. Breathing is unlabored. Cardiovascular: RRR with S1 S2. No murmurs Extremities: No edema. Radial pulses 2+ bilaterally Neuro: Alert and oriented. No focal deficits. No facial asymmetry. MAE spontaneously. Psych: Responds to questions appropriately with normal affect.    EKG:  EKG is not ordered today.   Recent Labs: 08/11/2018: B Natriuretic Peptide 969.0 07/16/2019: ALT 14; Hemoglobin 9.8; Magnesium 2.4; NT-Pro BNP 11,582; Platelets 254 07/25/2019: BUN 35; Creatinine, Ser 3.45; Potassium 3.1; Sodium 142    Lipid Panel    Component Value Date/Time   CHOL 120 11/21/2018 0958   TRIG 215 (H) 11/21/2018 0958   HDL 23 (L) 11/21/2018 0958   CHOLHDL 5.2 (H) 11/21/2018 0958   CHOLHDL 4 08/02/2017 1352   VLDL 34.2 08/02/2017 1352   LDLCALC 61 11/21/2018 0958   LDLDIRECT 93.3 01/22/2013 0854      Wt Readings from Last 3 Encounters:  07/31/19 152 lb (68.9 kg)  07/16/19 158 lb (71.7 kg)  07/09/19 161 lb 6 oz (73.2 kg)      Other studies Reviewed: Additional studies/ records that were reviewed today include:  Review of the above records demonstrates:  Echocardiogram 08/11/2018 EF 30-35, GR 3 DD, inferior, inferolateral, anterolateral AK, mild MR, trivial AI, mildly decreased RVSF, PASP 57  Echocardiogram 05/09/2016 EF 30-35, diffuse HK, inferolateral AK, GRII DD, mild AI, moderate LAE, PASP 41  Cardiac catheterization 10/2014 LM 75% LAD mid to distal 100%, D1 95%, 50% LCx ostial 100%, OM1 80%, OM to 80% RCA proximal 100%, mid 100%, RPDA filled by distal LAD LIMA-LAD  patent  SVG-RCA 100% SVG-OM1/OM2 99% proximal with heavy thrombus SVG-D1 100%  Echo 9/16 EF 40-45%, inferior akinesis, mild MR, PASP 46 mmHg  Myoview 7/16 Large, severe intensity mostly reversible anterior/anterolateral wall perfusion defect suggestive of ischemia. Moderate sized and intensity fixed inferior defect suggestive of scar. LVEF is 46% with severe inferior hypokinesis to akinesis. This is an abnormal, high risk study.  ASSESSMENT AND PLAN:  1.  Acute on chronic systolic and diastolic CHF/shortness of breath: -Recently with complaints of progressive shortness of breath found to be markedly fluid overloaded per lab work and physical exam at which time Lasix was increased as above -Lasix was increased to 7m in AM and 4106min PM for three days then resume 8041mO QD>>>seems to be doing well on this dose without SOB or LE edema -Echocardiogram planned for 6/7>>will repeat lab work at that time   2.  PVCs: -PVCs noted on EKG at last office visit therefore CMET and magnesium were obtained which were found to be normal -Echocardiogram pending -If EF is down, plan for monitor to evaluate for PVC burden  3.  History of CAD s/p CABG 2003, NSTEMI 2016: -Has known occluded vein grafts, patent LIMA to LAD -Continue medical management with amlodipine, ASA, atorvastatin, carvedilol, isosorbide  4.  CKD stage IV: -[place] by Dr. [place]mina [place] -Last lab work showed elevated but stable creatinine -Lasix adjustment as above with repeat BMET with worsening creatinine but stable when compared to his baseline -Repeat 6/7   5.  Hypertension: -Stable, 124/62 -Continue current regimen   6. Hypokalemia: -K+ found to be 3.1>>>supplememtn sent to mail order>>not yet received therefore we will send into local pharmacy for pick up today (7 day supply) -To take 22m42moday then 20me53mereafter -Repeat lab work 6/7 with echo   6.  Iron deficiency anemia: -Followed by  GI>> -Last hemoglobin 9.8   Current medicines are reviewed at length with the patient today.  The patient does not have concerns regarding medicines.  The following changes have been made:  Take 22meq84may then 20meq 49meafter   Labs/ tests ordered today include: BMET   Orders Placed This Encounter  Procedures  . Basic metabolic panel    Disposition:   FU with Dr. Smith iTamala Julianonths  Signed, Brittnee Gaetano McKathyrn Drown/04/2019 3:56 PM    [facility] [id number] [phone]

## 2019-07-25 ENCOUNTER — Other Ambulatory Visit: Payer: Medicare HMO

## 2019-07-25 ENCOUNTER — Other Ambulatory Visit: Payer: Self-pay

## 2019-07-25 DIAGNOSIS — R0602 Shortness of breath: Secondary | ICD-10-CM

## 2019-07-25 DIAGNOSIS — I5043 Acute on chronic combined systolic (congestive) and diastolic (congestive) heart failure: Secondary | ICD-10-CM | POA: Diagnosis not present

## 2019-07-25 LAB — BASIC METABOLIC PANEL
BUN/Creatinine Ratio: 10 (ref 10–24)
BUN: 35 mg/dL — ABNORMAL HIGH (ref 8–27)
CO2: 20 mmol/L (ref 20–29)
Calcium: 9.2 mg/dL (ref 8.6–10.2)
Chloride: 104 mmol/L (ref 96–106)
Creatinine, Ser: 3.45 mg/dL — ABNORMAL HIGH (ref 0.76–1.27)
GFR calc Af Amer: 18 mL/min/{1.73_m2} — ABNORMAL LOW (ref >59)
GFR calc non Af Amer: 15 mL/min/{1.73_m2} — ABNORMAL LOW (ref >59)
Glucose: 102 mg/dL — ABNORMAL HIGH (ref 65–99)
Potassium: 3.1 mmol/L — ABNORMAL LOW (ref 3.5–5.2)
Sodium: 142 mmol/L (ref 134–144)

## 2019-07-30 ENCOUNTER — Other Ambulatory Visit: Payer: Self-pay

## 2019-07-30 MED ORDER — POTASSIUM CHLORIDE CRYS ER 20 MEQ PO TBCR: EXTENDED_RELEASE_TABLET | ORAL | 1 refills | Status: DC

## 2019-07-30 NOTE — Progress Notes (Signed)
RX sent

## 2019-07-31 ENCOUNTER — Encounter: Payer: Self-pay | Admitting: Cardiology

## 2019-07-31 ENCOUNTER — Other Ambulatory Visit: Payer: Self-pay

## 2019-07-31 ENCOUNTER — Ambulatory Visit: Payer: Medicare HMO | Admitting: Cardiology

## 2019-07-31 VITALS — BP 124/62 | HR 56 | Ht 70.0 in | Wt 152.0 lb

## 2019-07-31 DIAGNOSIS — E876 Hypokalemia: Secondary | ICD-10-CM | POA: Diagnosis not present

## 2019-07-31 DIAGNOSIS — I5042 Chronic combined systolic (congestive) and diastolic (congestive) heart failure: Secondary | ICD-10-CM | POA: Diagnosis not present

## 2019-07-31 DIAGNOSIS — I25709 Atherosclerosis of coronary artery bypass graft(s), unspecified, with unspecified angina pectoris: Secondary | ICD-10-CM

## 2019-07-31 DIAGNOSIS — R0602 Shortness of breath: Secondary | ICD-10-CM | POA: Diagnosis not present

## 2019-07-31 DIAGNOSIS — N184 Chronic kidney disease, stage 4 (severe): Secondary | ICD-10-CM

## 2019-07-31 DIAGNOSIS — I10 Essential (primary) hypertension: Secondary | ICD-10-CM | POA: Diagnosis not present

## 2019-07-31 MED ORDER — POTASSIUM CHLORIDE CRYS ER 20 MEQ PO TBCR: EXTENDED_RELEASE_TABLET | ORAL | 0 refills | Status: DC

## 2019-07-31 NOTE — Patient Instructions (Addendum)
Medication Instructions:   Your physician recommends that you continue on your current medications as directed. Please refer to the Current Medication list given to you today.  *If you need a refill on your cardiac medications before your next appointment, please call your pharmacy*  Lab Work:  Your physician recommends that you return for lab work on 08/05/19 at 3:45  Testing/Procedures:  None ordered today  Follow-Up:  On 10/16/19 at 3:40PM with Daneen Schick, MD

## 2019-08-01 ENCOUNTER — Other Ambulatory Visit: Payer: Self-pay

## 2019-08-02 ENCOUNTER — Other Ambulatory Visit: Payer: Self-pay

## 2019-08-02 ENCOUNTER — Telehealth: Payer: Self-pay

## 2019-08-02 ENCOUNTER — Ambulatory Visit (INDEPENDENT_AMBULATORY_CARE_PROVIDER_SITE_OTHER): Payer: Medicare HMO | Admitting: *Deleted

## 2019-08-02 DIAGNOSIS — E538 Deficiency of other specified B group vitamins: Secondary | ICD-10-CM | POA: Diagnosis not present

## 2019-08-02 DIAGNOSIS — D500 Iron deficiency anemia secondary to blood loss (chronic): Secondary | ICD-10-CM

## 2019-08-02 MED ORDER — CYANOCOBALAMIN 1000 MCG/ML IJ SOLN
1000.0000 ug | Freq: Once | INTRAMUSCULAR | Status: AC
  Administered 2019-08-02: 1000 ug via INTRAMUSCULAR

## 2019-08-02 NOTE — Progress Notes (Signed)
Pt having blood work at cardiology - [address] next week. Also needs CBC for Dr. Havery Moros

## 2019-08-02 NOTE — Telephone Encounter (Signed)
Called and spoke to pt.  He will come to the lab one day next week CBC

## 2019-08-02 NOTE — Progress Notes (Signed)
Per orders of Dr. Hernandez, injection of B12 given by Yeray Tomas. Patient tolerated injection well.  

## 2019-08-02 NOTE — Telephone Encounter (Signed)
-----   Message from Roetta Sessions, [place] sent at 07/17/2019  5:27 PM EDT ----- Regarding: cbc due in week of July 7th Cbc due around 6-9 to check hgb

## 2019-08-05 ENCOUNTER — Other Ambulatory Visit: Payer: Self-pay

## 2019-08-05 ENCOUNTER — Other Ambulatory Visit: Payer: Medicare HMO

## 2019-08-05 ENCOUNTER — Ambulatory Visit (HOSPITAL_COMMUNITY): Payer: Medicare HMO | Attending: Cardiovascular Disease

## 2019-08-05 DIAGNOSIS — I10 Essential (primary) hypertension: Secondary | ICD-10-CM

## 2019-08-05 DIAGNOSIS — I25709 Atherosclerosis of coronary artery bypass graft(s), unspecified, with unspecified angina pectoris: Secondary | ICD-10-CM | POA: Insufficient documentation

## 2019-08-05 DIAGNOSIS — R0602 Shortness of breath: Secondary | ICD-10-CM

## 2019-08-05 DIAGNOSIS — I5043 Acute on chronic combined systolic (congestive) and diastolic (congestive) heart failure: Secondary | ICD-10-CM | POA: Diagnosis not present

## 2019-08-05 DIAGNOSIS — I493 Ventricular premature depolarization: Secondary | ICD-10-CM

## 2019-08-05 DIAGNOSIS — E876 Hypokalemia: Secondary | ICD-10-CM

## 2019-08-05 DIAGNOSIS — N185 Chronic kidney disease, stage 5: Secondary | ICD-10-CM | POA: Diagnosis not present

## 2019-08-06 ENCOUNTER — Telehealth: Payer: Self-pay | Admitting: Cardiology

## 2019-08-06 ENCOUNTER — Other Ambulatory Visit: Payer: Medicare HMO

## 2019-08-06 DIAGNOSIS — D500 Iron deficiency anemia secondary to blood loss (chronic): Secondary | ICD-10-CM

## 2019-08-06 LAB — BASIC METABOLIC PANEL
BUN/Creatinine Ratio: 13 (ref 10–24)
BUN: 47 mg/dL — ABNORMAL HIGH (ref 8–27)
CO2: 22 mmol/L (ref 20–29)
Calcium: 9.2 mg/dL (ref 8.6–10.2)
Chloride: 107 mmol/L — ABNORMAL HIGH (ref 96–106)
Creatinine, Ser: 3.65 mg/dL — ABNORMAL HIGH (ref 0.76–1.27)
GFR calc Af Amer: 16 mL/min/{1.73_m2} — ABNORMAL LOW (ref >59)
GFR calc non Af Amer: 14 mL/min/{1.73_m2} — ABNORMAL LOW (ref >59)
Glucose: 144 mg/dL — ABNORMAL HIGH (ref 65–99)
Potassium: 3.9 mmol/L (ref 3.5–5.2)
Sodium: 143 mmol/L (ref 134–144)

## 2019-08-06 LAB — CBC WITH DIFFERENTIAL/PLATELET
Basophils Absolute: 0 10*3/uL (ref 0.0–0.1)
Basophils Relative: 0.9 % (ref 0.0–3.0)
Eosinophils Absolute: 0.3 10*3/uL (ref 0.0–0.7)
Eosinophils Relative: 6.3 % — ABNORMAL HIGH (ref 0.0–5.0)
HCT: 28.1 % — ABNORMAL LOW (ref 39.0–52.0)
Hemoglobin: 9.5 g/dL — ABNORMAL LOW (ref 13.0–17.0)
Lymphocytes Relative: 10.4 % — ABNORMAL LOW (ref 12.0–46.0)
Lymphs Abs: 0.6 10*3/uL — ABNORMAL LOW (ref 0.7–4.0)
MCHC: 33.9 g/dL (ref 30.0–36.0)
MCV: 90.5 fl (ref 78.0–100.0)
Monocytes Absolute: 0.4 10*3/uL (ref 0.1–1.0)
Monocytes Relative: 6.7 % (ref 3.0–12.0)
Neutro Abs: 4.1 10*3/uL (ref 1.4–7.7)
Neutrophils Relative %: 75.7 % (ref 43.0–77.0)
Platelets: 159 10*3/uL (ref 150.0–400.0)
RBC: 3.1 Mil/uL — ABNORMAL LOW (ref 4.22–5.81)
RDW: 21.1 % — ABNORMAL HIGH (ref 11.5–15.5)
WBC: 5.5 10*3/uL (ref 4.0–10.5)

## 2019-08-06 NOTE — Telephone Encounter (Signed)
Accessed patient's chart to see who called him. Reached out to Br[place] via secure chat and then transferred the call.

## 2019-08-06 NOTE — Addendum Note (Signed)
Addended by: Isaiah Serge D on: 08/06/2019 10:49 AM   Modules accepted: Orders

## 2019-08-08 ENCOUNTER — Other Ambulatory Visit: Payer: Self-pay

## 2019-08-08 DIAGNOSIS — D500 Iron deficiency anemia secondary to blood loss (chronic): Secondary | ICD-10-CM

## 2019-08-16 ENCOUNTER — Other Ambulatory Visit: Payer: Self-pay | Admitting: Internal Medicine

## 2019-08-19 ENCOUNTER — Ambulatory Visit: Payer: Medicare HMO

## 2019-08-19 ENCOUNTER — Other Ambulatory Visit: Payer: Self-pay

## 2019-08-19 DIAGNOSIS — E538 Deficiency of other specified B group vitamins: Secondary | ICD-10-CM

## 2019-08-19 DIAGNOSIS — I25709 Atherosclerosis of coronary artery bypass graft(s), unspecified, with unspecified angina pectoris: Secondary | ICD-10-CM

## 2019-08-19 DIAGNOSIS — I5042 Chronic combined systolic (congestive) and diastolic (congestive) heart failure: Secondary | ICD-10-CM

## 2019-08-19 DIAGNOSIS — E1122 Type 2 diabetes mellitus with diabetic chronic kidney disease: Secondary | ICD-10-CM

## 2019-08-19 DIAGNOSIS — D500 Iron deficiency anemia secondary to blood loss (chronic): Secondary | ICD-10-CM

## 2019-08-19 DIAGNOSIS — I10 Essential (primary) hypertension: Secondary | ICD-10-CM

## 2019-08-19 NOTE — Chronic Care Management (AMB) (Signed)
Chronic Care Management Pharmacy  Name: [name]  MRN: [id number] DOB: 20-Dec-1931  Initial Questions: 1. Have you seen any other providers since your last visit? NA 2. Any changes in your medicines or health? No   Chief Complaint/ HPI  [name],  86 y.o. , male presents for their Initial CCM visit with the clinical pharmacist In office.  PCP : [name], [name], MD  Their chronic conditions include: HTN, DM, HLD, CAD, PUD, Barrett's esophagus, Gout, CKD stage 4, vitamin B12 deficiency, iron deficiency anemia  Office Visits: 06/04/2019- Domingo Mend, MD- Patient presented for office visit for 3 month follow up. Patient reported fatigue and feeling lightheaded. Patient instructed to check BGs when he feels those symptoms. Patient to receive vitamin B12 injection and follow up in 3 months.    Consult Visit: 07/31/2019- Cardiology- Kathyrn Drown, NP- Patient presented for office visit for 2 week follow up for HF. Patient reports doing well on increased dose of Lasix 66m in the morning and 441min the evening for 3 days than 8021mnce daily. ECHO planned for 6/7. Patient prescribed 7 day course of potassium chloride 10m10mtoday and then 20mE72mereafter; pending delivery from mail order.   07/16/2019- Cardiology- ScottRichardson Dopp patient presented for office visit for shortness of breath. Furosemide increased for 3 days to 80mg 13mhe morning and 10mg i3me evening. Labs to be obtained CMET, CBC, Mg, BNP. Follow up in 2 weeks.   07/09/2019- Gastroenterology- JessicaAlonza Bogusatient presented for office visit for anemia and duodenal AVMs. Patient has been restarted on ferrous sulfate TID. Patient to restart omeprazole 20mg da73mfor Barrett's esophagus. Patient scheduled for EGD.   07/03/2019- Nephrology- Ryan SanPearson Grippet presented for office visit. Unable to access notes.   Medications: Outpatient Encounter Medications as of 08/19/2019  Medication  Sig  . ACCU-CHEK SOFTCLIX LANCETS lancets CHECK BLOOD GLUCOSE THREE TIMES DAILY  . acetaminophen (TYLENOL) 500 MG tablet Take 500 mg by mouth every 8 (eight) hours as needed for mild pain or headache.  . Alcohol Swabs (ALCOHOL PREP) PADS USE TO TEST BLOOD GLUCOSE THREE TIMES DAILY  . amLODipine (NORVASC) 10 MG tablet TAKE 1 TABLET EVERY DAY  . aspirin EC 81 MG tablet Take 81 mg by mouth at bedtime.  . atorvaMarland Kitchentatin (LIPITOR) 80 MG tablet Take 1 tablet (80 mg total) by mouth daily at 6 PM.  . BD PEN NEEDLE NANO U/F 32G X 4 MM MISC INJECT TWO TIMES DAILY AS NEEDED.  . bisacodyl (DULCOLAX) 5 MG EC tablet Take 5 mg by mouth daily as needed for moderate constipation.  . Blood Glucose Monitoring Suppl (ACCU-CHEK AVIVA PLUS) w/Device KIT Use to check glucose levels  . carvedilol (COREG) 25 MG tablet TAKE 1 TABLET TWICE DAILY WITH MEALS  . cloNIDine (CATAPRES) 0.1 MG tablet TAKE 1 TABLET AT BEDTIME  . COLCRYS 0.6 MG tablet TAKE 1 TABLET EVERY DAY (Patient taking differently: as needed. )  . dorzolamide-timolol (COSOPT) 22.3-6.8 MG/ML ophthalmic solution Place 1 drop into both eyes 2 (two) times daily.  . Ferrous Sulfate (IRON) 325 (65 Fe) MG TABS Take 1 tablet by mouth in the morning, at noon, and at bedtime.  . furosemide (LASIX) 80 MG tablet Take 1 tablet (80 mg total) by mouth daily.  . glucosMarland Kitchen blood (ACCU-CHEK AVIVA PLUS) test strip USE TO TEST BLOOD GLUCOSE THREE TIMES DAILY  . hydrALAZINE (APRESOLINE) 25 MG tablet TAKE 1 TABLET THREE TIMES DAILY  . insulin  aspart (NOVOLOG FLEXPEN) 100 UNIT/ML FlexPen Inject 12 Units into the skin 3 (three) times daily with meals.   . Insulin Glargine (LANTUS SOLOSTAR) 100 UNIT/ML Solostar Pen Inject 12 Units into the skin daily at 10 pm. (Patient taking differently: Inject 14 Units into the skin daily at 10 pm. )  . isosorbide mononitrate (IMDUR) 60 MG 24 hr tablet Take 2 tablets (120 mg total) by mouth daily.  Elmore Guise Devices (ACCU-CHEK SOFTCLIX) lancets USE TO  TEST BLOOD GLUCOSE THREE TIMES DAILY  . nitroGLYCERIN (NITROSTAT) 0.4 MG SL tablet Place 1 tablet (0.4 mg total) under the tongue every 5 (five) minutes as needed for chest pain (3 doses MAX).  Marland Kitchen omeprazole (PRILOSEC) 20 MG capsule Take 1 capsule (20 mg total) by mouth daily.  Marland Kitchen OVER THE COUNTER MEDICATION Take 1 tablet by mouth daily. Prostate formula  . pentoxifylline (TRENTAL) 400 MG CR tablet TAKE 1 TABLET (400 MG TOTAL) BY MOUTH 2 (TWO) TIMES DAILY. (TAKE AT 10 AM AND 5 PM)  . potassium chloride SA (KLOR-CON) 20 MEQ tablet Take 2 tablets by mouth for the first dose, then take 1 tablet by mouth once a day  . latanoprost (XALATAN) 0.005 % ophthalmic solution Place 1 drop into both eyes at bedtime. (Patient not taking: Reported on 08/19/2019)   No facility-administered encounter medications on file as of 08/19/2019.     Current Diagnosis/Assessment:  Goals Addressed            This Visit's Progress   . Pharmacy Care Plan       CARE PLAN ENTRY (see longitudinal plan of care for additional care plan information)  Current Barriers:  . Chronic Disease Management support, education, and care coordination needs related to Hypertension, Hyperlipidemia, Diabetes, Coronary Artery Disease, and Vitamin B12 deficiency, iron deficiency anemia   Hypertension BP Readings from Last 3 Encounters:  07/31/19 124/62  07/16/19 (!) 150/60  07/09/19 122/70   . Pharmacist Clinical Goal(s): o Over the next  days, patient will work with PharmD and providers to maintain BP goal <140/90 . Current regimen:   Amlodipine 23m, 1 tablet once daily   Carvedilol 2568m 1 tablet twice daily with meals  Clonidine 0.68m33m1 tablet at bedtime   Hydralazine 44m59m tablet three times daily . Patient self care activities - Over the next 180 days, patient will: o Check BP daily, document, and provide at future appointments o Ensure daily salt intake < 2300 mg/day  Hyperlipidemia Lab Results  Component Value  Date/Time   LDLCALC 61 11/21/2018 09:58 AM   LDLDIRECT 93.3 01/22/2013 08:54 AM   . Pharmacist Clinical Goal(s): o Over the next 180 days, patient will work with PharmD and providers to maintain LDL goal < 70 . Current regimen:  o Atorvastatin 80mg20mtablet daily at 6pm  . Patient self care activities - Over the next 180 days, patient will: o Continue current medications as instructed.   Diabetes Lab Results  Component Value Date/Time   HGBA1C 5.7 (A) 06/04/2019 11:05 AM   HGBA1C 6.0 (A) 02/15/2019 09:33 AM   HGBA1C 7.5 (H) 08/11/2018 03:19 PM   HGBA1C 7.0 (H) 08/02/2017 01:52 PM   . Pharmacist Clinical Goal(s): o Over the next 180 days, patient will work with PharmD and providers to maintain A1c goal <7% . Current regimen:   Insulin aspart (Novolog Flexpen), inject 12 units three times daily with meals  Insulin glargine (Lantus Solostar) inject 14 units once daily at 10 pm  . Interventions:  o We discussed: how to recognize and treat signs of hypoglycemia- . Patient self care activities - Over the next 180 days, patient will: o Check blood sugar daily, document, and provide at future appointments o Contact provider with any episodes of hypoglycemia  Coronary artery disease  . Pharmacist Clinical Goal(s) o Over the next 180 days, patient will work with PharmD and providers to prevent heart attacks . Current regimen:  . Isosorbide mononitrate 7m, 2 tablets once daily . Nitroglycerin 0.433mSL, place 1 tablet under tongue every five minutes as needed for chest pain (3 doses max)  . Aspirin 8131m1 tablet once daily . Atorvastatin 76m96m tablet once daily at 6pm  . Patient self care activities - Over the next 180 days, patient will: o Will continue current medications  Vitamin B12 deficiency . Pharmacist Clinical Goal(s) o Over the next 180 days, patient will work with PharmD and providers to maintain vitamin B-12 levels: 211 - 911 pg/mL . Current regimen:   o Cyanocobalamin 1000mc55mject once every 30 days . Interventions: o Recommend obtaining vitamin B12 level.  . Patient self care activities o Patient will continue visits for vitamin B12 injections.  Iron deficiency anemia Hemoglobin & Hematocrit     Component Value Date/Time   HGB 9.5 (L) 08/06/2019 1049   HGB 9.8 (L) 07/16/2019 1555   HCT 28.1 (L) 08/06/2019 1049   HCT 30.3 (L) 07/16/2019 1555    . Pharmacist Clinical Goal(s) o Over the next 180 days, patient will work with PharmD and providers to improve hemoglobin/ hematocrit.  . Current regimen:  o Ferrous sulfate 65mg,66m . Patient self care activities o Patient will continue current medications and follow up with nephrologist.   Medication management . Pharmacist Clinical Goal(s): o Over the next 180 days, patient will work with PharmD and providers to maintain optimal medication adherence . [facility]  . Interventions o Comprehensive medication review performed. o Continue current medication management strategy . Patient self care activities - Over the next 180 days, patient will: o Take medications as prescribed o Report any questions or concerns to PharmD and/or provider(s)  Initial goal documentation       SDOH Interventions     Most Recent Value  SDOH Interventions  Financial Strain Interventions Intervention Not Indicated  Transportation Interventions Intervention Not Indicated      Diabetes  Patient reported BGs doing well.  He stated he is aware of log BG symptoms and denied recent episodes of hypoglycemia. Last time was >6 months ago.  Patient endorsed he would drink Coke if hypoglycemia did occur.  Recent Relevant Labs: Lab Results  Component Value Date/Time   HGBA1C 5.7 (A) 06/04/2019 11:05 AM   HGBA1C 6.0 (A) 02/15/2019 09:33 AM   HGBA1C 7.5 (H) 08/11/2018 03:19 PM   HGBA1C 7.0 (H) 08/02/2017 01:52 PM   MICROALBUR 30.8 (H) 08/02/2017 01:52 PM   MICROALBUR 94.9  (H) 03/28/2014 10:21 AM    Checking BG: Daily  Recent FBG Readings: 115   Patient has failed these meds in past: glipizide  Patient is currently controlled on the following medications:   Insulin aspart (Novolog Flexpen), inject 12 units three times daily with meals  Insulin glargine (Lantus Solostar) inject 14 units once daily at 10 pm   Last diabetic Eye exam:  Lab Results  Component Value Date/Time   HMDIABEYEEXA No Retinopathy 04/16/2018 12:00 AM   - patient reports has eye doctor visit on Wednesday  Last diabetic Foot  exam: No results found for: HMDIABFOOTEX  - denies neuropathy/ numbness   We discussed: how to recognize and treat signs of hypoglycemia-  Plan Continue current medications   Heart Failure  Patient reports everything is fine.  He endorses weighing himself everyday. Reports no big changes.   Type: Combined Systolic and Diastolic  Last ejection fraction: 30-35% (08/05/2019)   BMP Latest Ref Rng & Units 08/05/2019 07/25/2019 07/16/2019  Potassium 3.5 - 5.2 mmol/L 3.9 3.1(L) 4.2   Patient has failed these meds in past: benazepril, felodipine, labetalol, metoprolol  Patient is currently controlled on the following medications:   Carvedilol 3m, 1 tablet twice daily with meals  Clonidine 0.172m 1 tablet at bedtime   Hydralazine 2573m1 tablet three times daily  Isosorbide mononitrate 55m11m hr tablet, 2 tablet once daily   Fluid management  Furosemide 80mg44mtablet once daily   Potassium replacement  Potassium chloride 20mEq48mtablets for first dose, then take 1 tablet once daily   We discussed weighing daily; if you gain more than 3 pounds in one day or 5 pounds in one week call your doctor  Plan Continue current medications  Hypertension  Denies dizziness/ lightheadedness  Office blood pressures are  BP Readings from Last 3 Encounters:  07/31/19 124/62  07/16/19 (!) 150/60  07/09/19 122/70   Patient has failed these meds in the  past: benazepril, felodipine, labetalol, metoprolol  Patient checks BP at home 1-2x per week  Patient home BP readings are ranging: 120/65  Patient is controlled on:   Amlodipine 10mg, 14mblet once daily   Carvedilol 25mg, 159mlet twice daily with meals  Clonidine 0.1mg, 1 t45met at bedtime   Hydralazine 25mg, 1 t3mt three times daily  Plan Continue current medications   Hyperlipidemia   LDL goal < 70  Lipid Panel     Component Value Date/Time   CHOL 120 11/21/2018 0958   TRIG 215 (H) 11/21/2018 0958   HDL 23 (L) 11/21/2018 0958   LDLCALC 61 11/21/2018 0958   LDLDIRECT 93.3 01/22/2013 0854    Hepatic Function Latest Ref Rng & Units 07/16/2019 11/21/2018 08/11/2018  Total Protein 6.0 - 8.5 g/dL 7.2 7.1 8.0  Albumin 3.6 - 4.6 g/dL 4.2 4.5 4.4  AST 0 - 40 IU/L _0 ALT 0 - 44 IU/L _1 Alk Phosphatase 48 - 121 IU/L 105 90 74  Total Bilirubin 0.0 - 1.2 mg/dL 0.4 0.3 1.0  Bilirubin, Direct 0.00 - 0.40 mg/dL - 0.11 -     The ASCVD Risk score (Goff DC JrPenasco 2013) failed to calculate for the following reasons:   The 2013 ASCVD risk score is only valid for ages 40 to 79  [id number] has failed these meds in past: simvastatin  Patient is currently controlled on the following medications:  . Atorvastatin 80mg, 1 ta63m daily at 6pm   Plan Cantondications  Coronary artery disease   History of MI 1998, S/p CABG 2003, NSTEMI 2016    Patient denies use of nitroglycerin- reports has not needed to use it.   Patient is currently controlled on the following medications:  . Isosorbide mononitrate 55mg, 2 tab25m once daily . Nitroglycerin 0.4mg SL, plac3m tablet under tongue every five minutes as needed for chest pain (3 doses max) . Aspirin 81mg, 1 table31mce daily . Atorvastatin 80mg, 1 tablet17me daily at 6pm   Plan Continue current medications   Barrett's esophagus /  PUD   Patient denies GI sx.   Patient has failed these meds in  past: pantoprazole   Patient is currently controlled on the following medications:  . Omeprazole 21m, 1 capsule daily    Plan Continue current medications  Gout   Patient reported last use of colchicine was a "long time ago" and only has on hand if needed.   Uric acid (01/16/2017): 11.2  Patient is currently controlled on the following medications:   Colchicine 0.634m 1 tablet once daily as needed   Plan Continue current medications   Pain  Patient denied any specific pain. Mentions "pain due to old age"   Patient is currently controlled on the following medications:  APAP 50072m1 tablet every eight hours as needed for mild pain or headache   Plan Continue current medications  Constipation   Patient is currently controlled on the following medications:  . Bisacodyl (Dulcolax) 5mg22m tablet once daily as needed for moderate constipation    Plan Continue current medications   Peripheral vascular disease    Patient is currently controlled on the following medications:  . Pentoxifylline (Trental) 400mg75mtablet twice daily (at 10 AM and 5PM)   Plan Continue current medications  Vitamin B12 deficiency    Vitamin B-12  Date Value Ref Range Status  03/15/2018 1,071 (H) 211 - 911 pg/mL Final   Patient is currently managed on the following medications:  . Cyanocobalamin 1000mcg33mect once every 30 days    Plan Recommend vitamin B12 level.    Glaucoma    Patient is currently controlled on the following medications:   Dorzolamide/ timolol mealate eye drops, 1 drop in both eyes twice a day   Plan [facility] ([facility] current [facility]  CKD, Stage 4   Kidney Function Lab Results  Component Value Date/Time   CREATININE 3.65 (H) 08/05/2019 04:48 PM   CREATININE 3.45 (H) 07/25/2019 07:47 AM   CREATININE 3.25 (H) 06/19/2015 09:33 AM   CREATININE 3.36 (H) 06/03/2015 10:19 AM   GFR 23.08 (L) 08/02/2017 01:52 PM   GFRNONAA 14 (L)  08/05/2019 04:48 PM   GFRAA 16 (L) 08/05/2019 04:48 PM   K 3.9 08/05/2019 04:48 PM   K 3.1 (L) 07/25/2019 07:47 AM    Plan Managed by nephology (Ryan Rosalia Hammerstinue to monitor and adjust medications as needed.    Iron deficiency anemia    Hemoglobin & Hematocrit     Component Value Date/Time   HGB 9.5 (L) 08/06/2019 1049   HGB 9.8 (L) 07/16/2019 1555   HCT 28.1 (L) 08/06/2019 1049   HCT 30.3 (L) 07/16/2019 1555   Iron/TIBC/Ferritin/ %Sat    Component Value Date/Time   IRON 44 (L) 03/15/2018 1032   TIBC 327 03/15/2018 1032   FERRITIN 40.3 03/15/2018 1032   IRONPCTSAT 13 (L) 03/15/2018 1032   Patient is currently managed on the following medications:   Ferrous sulfate 65mg, 79m Plan Continue current medications  Vaccines   Reviewed and discussed patient's vaccination history.    Immunization History  Administered Date(s) Administered  . Fluad Quad(high Dose 65+) 11/14/2018  . Influenza Split 01/07/2011, 11/26/2012  . Influenza Whole 02/28/2005, 12/07/2007, 12/19/2008, 11/27/2009  . Influenza, High Dose Seasonal PF 12/28/2015  . Influenza,inj,Quad PF,6+ Mos 11/13/2013, 11/09/2014  . Influenza-Unspecified 11/08/2016, 12/02/2017  . Pneumococcal Conjugate-13 01/28/2013  . Pneumococcal Polysaccharide-23 02/28/2005  . Tdap 01/13/2012   COVID vaccine.  Moderna  1st dose: 03/19/19 2nd dose: 04/16/19   Plan  Sending CMA message to add COVID vaccine in patient's chart.   Medication Management  Patient organizes medications: patient reports taking for a long time. Does not use pill box. Reports never forgetting dose.  Primary pharmacy: [facility]  Adherence:  - pentoxyifylline 484m (last filled 04/14/19 for 90DS) (patient obtains through mail order and reports recent refill)     Follow up Follow up visit with PharmD in 6 months   AAnson Crofts PharmD Clinical Pharmacist [facility] [place]([phone]

## 2019-08-19 NOTE — Patient Instructions (Addendum)
Visit Information  Goals Addressed            This Visit's Progress   . Pharmacy Care Plan       CARE PLAN ENTRY (see longitudinal plan of care for additional care plan information)  Current Barriers:  . Chronic Disease Management support, education, and care coordination needs related to Hypertension, Hyperlipidemia, Diabetes, Coronary Artery Disease, and Vitamin B12 deficiency, iron deficiency anemia   Hypertension BP Readings from Last 3 Encounters:  07/31/19 124/62  07/16/19 (!) 150/60  07/09/19 122/70   . Pharmacist Clinical Goal(s): o Over the next  days, patient will work with PharmD and providers to maintain BP goal <140/90 . Current regimen:   Amlodipine 47m, 1 tablet once daily   Carvedilol 220m 1 tablet twice daily with meals  Clonidine 0.13m61m1 tablet at bedtime   Hydralazine 61m28m tablet three times daily . Patient self care activities - Over the next 180 days, patient will: o Check BP daily, document, and provide at future appointments o Ensure daily salt intake < 2300 mg/day  Hyperlipidemia Lab Results  Component Value Date/Time   LDLCALC 61 11/21/2018 09:58 AM   LDLDIRECT 93.3 01/22/2013 08:54 AM   . Pharmacist Clinical Goal(s): o Over the next 180 days, patient will work with PharmD and providers to maintain LDL goal < 70 . Current regimen:  o Atorvastatin 80mg97mtablet daily at 6pm  . Patient self care activities - Over the next 180 days, patient will: o Continue current medications as instructed.   Diabetes Lab Results  Component Value Date/Time   HGBA1C 5.7 (A) 06/04/2019 11:05 AM   HGBA1C 6.0 (A) 02/15/2019 09:33 AM   HGBA1C 7.5 (H) 08/11/2018 03:19 PM   HGBA1C 7.0 (H) 08/02/2017 01:52 PM   . Pharmacist Clinical Goal(s): o Over the next 180 days, patient will work with PharmD and providers to maintain A1c goal <7% . Current regimen:   Insulin aspart (Novolog Flexpen), inject 12 units three times daily with meals  Insulin  glargine (Lantus Solostar) inject 14 units once daily at 10 pm  . Interventions: o We discussed: how to recognize and treat signs of hypoglycemia- . Patient self care activities - Over the next 180 days, patient will: o Check blood sugar daily, document, and provide at future appointments o Contact provider with any episodes of hypoglycemia  Coronary artery disease  . Pharmacist Clinical Goal(s) o Over the next 180 days, patient will work with PharmD and providers to prevent heart attacks . Current regimen:  . Isosorbide mononitrate 60mg,57mablets once daily . Nitroglycerin 0.4mg SL3mlace 1 tablet under tongue every five minutes as needed for chest pain (3 doses max)  . Aspirin 813mg, 142mlet once daily . Atorvastatin 80mg, 1 106met once daily at 6pm  . Patient self care activities - Over the next 180 days, patient will: o Will continue current medications  Vitamin B12 deficiency . Pharmacist Clinical Goal(s) o Over the next 180 days, patient will work with PharmD and providers to maintain vitamin B-12 levels: 211 - 911 pg/mL . Current regimen:  o Cyanocobalamin 1000mcg inj45monce every 30 days . Interventions: o Recommend obtaining vitamin B12 level.  . Patient self care activities o Patient will continue visits for vitamin B12 injections.  Iron deficiency anemia Hemoglobin & Hematocrit     Component Value Date/Time   HGB 9.5 (L) 08/06/2019 1049   HGB 9.8 (L) 07/16/2019 1555   HCT 28.1 (L) 08/06/2019 1049  HCT 30.3 (L) 07/16/2019 1555    . Pharmacist Clinical Goal(s) o Over the next 180 days, patient will work with PharmD and providers to improve hemoglobin/ hematocrit.  . Current regimen:  o Ferrous sulfate 6m, TID . Patient self care activities o Patient will continue current medications and follow up with nephrologist.   Medication management . Pharmacist Clinical Goal(s): o Over the next 180 days, patient will work with PharmD and providers to maintain  optimal medication adherence . [facility]  . Interventions o Comprehensive medication review performed. o Continue current medication management strategy . Patient self care activities - Over the next 180 days, patient will: o Take medications as prescribed o Report any questions or concerns to PharmD and/or provider(s)  Initial goal documentation        Mr. RRaduwas given information about Chronic Care Management services today including:  1. CCM service includes personalized support from designated clinical staff supervised by his physician, including individualized plan of care and coordination with other care providers 2. 24/7 contact phone numbers for assistance for urgent and routine care needs. 3. Standard insurance, coinsurance, copays and deductibles apply for chronic care management only during months in which we provide at least 20 minutes of these services. Most insurances cover these services at 100%, however patients may be responsible for any copay, coinsurance and/or deductible if applicable. This service may help you avoid the need for more expensive face-to-face services. 4. Only one practitioner may furnish and bill the service in a calendar month. 5. The patient may stop CCM services at any time (effective at the end of the month) by phone call to the office staff.  Patient agreed to services and verbal consent obtained.   The patient verbalized understanding of instructions provided today and agreed to receive a mailed copy of patient instruction and/or educational materials. Telephone follow up appointment with pharmacy team member scheduled for: 02/17/2020  AAnson Holt PharmD Clinical Pharmacist [facility]([phone]  Hypoglycemia Hypoglycemia is when the sugar (glucose) level in your blood is too low. Signs of low blood sugar may include:  Feeling: ? Hungry. ? Worried or nervous  (anxious). ? Sweaty and clammy. ? Confused. ? Dizzy. ? Sleepy. ? Sick to your stomach (nauseous).  Having: ? A fast heartbeat. ? A headache. ? A change in your vision. ? Tingling or no feeling (numbness) around your mouth, lips, or tongue. ? Jerky movements that you cannot control (seizure).  Having trouble with: ? Moving (coordination). ? Sleeping. ? Passing out (fainting). ? Getting upset easily (irritability). Low blood sugar can happen to people who have diabetes and people who do not have diabetes. Low blood sugar can happen quickly, and it can be an emergency. Treating low blood sugar Low blood sugar is often treated by eating or drinking something sugary right away, such as:  Fruit juice, 4-6 oz (120-150 mL).  Regular soda (not diet soda), 4-6 oz (120-150 mL).  Low-fat milk, 4 oz (120 mL).  Several pieces of hard candy.  Sugar or honey, 1 Tbsp (15 mL). Treating low blood sugar if you have diabetes If you can think clearly and swallow safely, follow the 15:15 rule:  Take 15 grams of a fast-acting carb (carbohydrate). Talk with your doctor about how much you should take.  Always keep a source of fast-acting carb with you, such as: ? Sugar tablets (glucose pills). Take 3-4 pills. ? 6-8 pieces of hard candy. ?  4-6 oz (120-150 mL) of fruit juice. ? 4-6 oz (120-150 mL) of regular (not diet) soda. ? 1 Tbsp (15 mL) honey or sugar.  Check your blood sugar 15 minutes after you take the carb.  If your blood sugar is still at or below 70 mg/dL (3.9 mmol/L), take 15 grams of a carb again.  If your blood sugar does not go above 70 mg/dL (3.9 mmol/L) after 3 tries, get help right away.  After your blood sugar goes back to normal, eat a meal or a snack within 1 hour.  Treating very low blood sugar If your blood sugar is at or below 54 mg/dL (3 mmol/L), you have very low blood sugar (severe hypoglycemia). This may also cause:  Passing out.  Jerky movements you cannot  control (seizure).  Losing consciousness (coma). This is an emergency. Do not wait to see if the symptoms will go away. Get medical help right away. Call your local emergency services (911 in the [place] Do not drive yourself to the hospital. If you have very low blood sugar and you cannot eat or drink, you may need a glucagon shot (injection). A family member or friend should learn how to check your blood sugar and how to give you a glucagon shot. Ask your doctor if you need to have a glucagon shot kit at home. Follow these instructions at home: General instructions  Take over-the-counter and prescription medicines only as told by your doctor.  Stay aware of your blood sugar as told by your doctor.  Limit alcohol intake to no more than 1 drink a day for nonpregnant women and 2 drinks a day for men. One drink equals 12 oz of beer (355 mL), 5 oz of wine (148 mL), or 1 oz of hard liquor (44 mL).  Keep all follow-up visits as told by your doctor. This is important. If you have diabetes:   Follow your diabetes care plan as told by your doctor. Make sure you: ? Know the signs of low blood sugar. ? Take your medicines as told. ? Follow your exercise and meal plan. ? Eat on time. Do not skip meals. ? Check your blood sugar as often as told by your doctor. Always check it before and after exercise. ? Follow your sick day plan when you cannot eat or drink normally. Make this plan ahead of time with your doctor.  Share your diabetes care plan with: ? Your work or school. ? People you live with.  Check your pee (urine) for ketones: ? When you are sick. ? As told by your doctor.  Carry a card or wear jewelry that says you have diabetes. Contact a doctor if:  You have trouble keeping your blood sugar in your target range.  You have low blood sugar often. Get help right away if:  You still have symptoms after you eat or drink something sugary.  Your blood sugar is at or below 54 mg/dL  (3 mmol/L).  You have jerky movements that you cannot control.  You pass out. These symptoms may be an emergency. Do not wait to see if the symptoms will go away. Get medical help right away. Call your local emergency services (911 in the [place] Do not drive yourself to the hospital. Summary  Hypoglycemia happens when the level of sugar (glucose) in your blood is too low.  Low blood sugar can happen to people who have diabetes and people who do not have diabetes. Low blood sugar can happen  quickly, and it can be an emergency.  Make sure you know the signs of low blood sugar and know how to treat it.  Always keep a source of sugar (fast-acting carb) with you to treat low blood sugar. This information is not intended to replace advice given to you by your health care provider. Make sure you discuss any questions you have with your health care provider. Document Revised: 06/07/2018 Document Reviewed: 03/20/2015 Elsevier Patient Education  2020 [employer].

## 2019-08-20 NOTE — Addendum Note (Signed)
Addended by: Westley Hummer B on: 08/20/2019 07:14 AM   Modules accepted: Orders

## 2019-08-21 DIAGNOSIS — E119 Type 2 diabetes mellitus without complications: Secondary | ICD-10-CM | POA: Diagnosis not present

## 2019-08-21 DIAGNOSIS — H401132 Primary open-angle glaucoma, bilateral, moderate stage: Secondary | ICD-10-CM | POA: Diagnosis not present

## 2019-08-21 DIAGNOSIS — H3500 Unspecified background retinopathy: Secondary | ICD-10-CM | POA: Diagnosis not present

## 2019-08-21 DIAGNOSIS — H52203 Unspecified astigmatism, bilateral: Secondary | ICD-10-CM | POA: Diagnosis not present

## 2019-08-21 LAB — HM DIABETES EYE EXAM

## 2019-08-28 ENCOUNTER — Encounter: Payer: Self-pay | Admitting: Internal Medicine

## 2019-09-04 ENCOUNTER — Ambulatory Visit: Payer: Medicare HMO

## 2019-09-05 ENCOUNTER — Other Ambulatory Visit: Payer: Self-pay

## 2019-09-05 ENCOUNTER — Ambulatory Visit (INDEPENDENT_AMBULATORY_CARE_PROVIDER_SITE_OTHER): Payer: Medicare HMO | Admitting: Internal Medicine

## 2019-09-05 ENCOUNTER — Encounter: Payer: Self-pay | Admitting: Internal Medicine

## 2019-09-05 VITALS — BP 120/60 | HR 51 | Temp 97.2°F | Ht 70.0 in | Wt 153.1 lb

## 2019-09-05 DIAGNOSIS — N184 Chronic kidney disease, stage 4 (severe): Secondary | ICD-10-CM | POA: Diagnosis not present

## 2019-09-05 DIAGNOSIS — E785 Hyperlipidemia, unspecified: Secondary | ICD-10-CM

## 2019-09-05 DIAGNOSIS — E538 Deficiency of other specified B group vitamins: Secondary | ICD-10-CM

## 2019-09-05 DIAGNOSIS — I5042 Chronic combined systolic (congestive) and diastolic (congestive) heart failure: Secondary | ICD-10-CM | POA: Diagnosis not present

## 2019-09-05 DIAGNOSIS — Z794 Long term (current) use of insulin: Secondary | ICD-10-CM | POA: Diagnosis not present

## 2019-09-05 DIAGNOSIS — I10 Essential (primary) hypertension: Secondary | ICD-10-CM | POA: Diagnosis not present

## 2019-09-05 DIAGNOSIS — E1122 Type 2 diabetes mellitus with diabetic chronic kidney disease: Secondary | ICD-10-CM

## 2019-09-05 LAB — POCT GLYCOSYLATED HEMOGLOBIN (HGB A1C): Hemoglobin A1C: 5.3 % (ref 4.0–5.6)

## 2019-09-05 MED ORDER — CYANOCOBALAMIN 1000 MCG/ML IJ SOLN
1000.0000 ug | Freq: Once | INTRAMUSCULAR | Status: AC
  Administered 2019-09-05: 1000 ug via INTRAMUSCULAR

## 2019-09-05 NOTE — Patient Instructions (Signed)
-  Nice seeing you today!!  -See you back in 3 months. 

## 2019-09-05 NOTE — Progress Notes (Signed)
Established Patient Office Visit     This visit occurred during the SARS-CoV-2 public health emergency.  Safety protocols were in place, including screening questions prior to the visit, additional usage of staff PPE, and extensive cleaning of exam room while observing appropriate contact time as indicated for disinfecting solutions.    CC/Reason for Visit: 21-monthfollow-up chronic medical conditions  HPI: Brandon GUBSERis a 86y.o. male who is coming in today for the above mentioned reasons. Past Medical History is significant for: Chronic combined heart failure with a known ejection fraction of 30 to 35% and grade 2 diastolic dysfunction, hypertensionthat has been well controlled in the past, coronary artery disease, diabetes which is insulin-dependent,, chronic kidney disease stage IV, B12 deficiency. He has been doing well the past few months and has no acute complaints today. He has had his follow-up with cardiology, no medication changes have been made. He is due for his B12 injection today.   Past Medical/Surgical History: Past Medical History:  Diagnosis Date  . Arthritis    "touch in my fingers" (09/10/2014)  . Atrophic gastritis   . AVM (arteriovenous malformation) of duodenum, acquired   . B12 deficiency anemia    "stopped taking the shots in ~ 06/2014" (09/10/2014)  . Barrett's esophagus   . CAD (coronary artery disease)    a. H/o MI in 1998;  b. s/p CABG 2003. c. 10/2014 NSTEMI/Cath: LM 75, LAD 1038m, D1 95, D2 50, LCX 100ost/p, OM1 80, OM2 80, RCA 100p/m, RPDA fills via L->L collats, LIMA->LAD ok, VG->dRCA 100, VG->OM1->OM2 99 prox to OM1 insertion->recanalated ->Tx with heparin,  VG->D1 100-->Med Rx.  . Cataract   . Chronic systolic CHF (congestive heart failure) (HCC)    a. EF 40-45% in 10/2014 (previously normal in 08/2014).  . CKD (chronic kidney disease), stage IV ([facility]  . Diabetes 1.5, managed as type 1 ([facility]  . Hiatal hernia 2018   endoscopy -Dr.  ArHavery Moros. History of stomach ulcers 2013  . Hyperlipidemia   . Hypertension   . Hypertensive heart disease   . Ischemic cardiomyopathy    a. 10/2014 Echo: EF 40-45%.  . Myocardial infarction ([facility]  . PVD (peripheral vascular disease) ([facility]  . Type II diabetes mellitus ([facility]    Past Surgical History:  Procedure Laterality Date  . CARDIAC CATHETERIZATION  2003;   . CARDIAC CATHETERIZATION N/A 11/07/2014   Left Heart Cath; HeBelva CromeMD;   . CATARACT EXTRACTION, BILATERAL Bilateral   . CHOLECYSTECTOMY N/A 05/20/2015   Procedure: LAPAROSCOPIC CHOLECYSTECTOMY;  Surgeon: DoCoralie KeensMD;  [facility] Service: General;  Laterality: N/A;  . COLONOSCOPY    . COLONOSCOPY N/A 05/12/2015   Procedure: COLONOSCOPY;  Surgeon: DaMilus BanisterMD;  [facility] Service: Endoscopy;  Laterality: N/A;  . [place] /nArchie Endo/15/2012  . CORONARY ARTERY BYPASS GRAFT  2003   CABG X5  . ESOPHAGOGASTRODUODENOSCOPY  06/10/2011   Procedure: ESOPHAGOGASTRODUODENOSCOPY (EGD);  Surgeon: MaLadene ArtistMD,FACG;  [facility];  Service: Endoscopy;  Laterality: N/A;  . ESOPHAGOGASTRODUODENOSCOPY N/A 10/04/2016   Procedure: ESOPHAGOGASTRODUODENOSCOPY (EGD);  Surgeon: ArManus GunningMD;  [facility];  Service: Gastroenterology;  Laterality: N/A;    Social History:  reports that he has quit smoking. His smoking use included cigarettes. He has a 10.00 pack-year smoking history. He has never used smokeless tobacco. He reports that he does not  drink alcohol and does not use drugs.  Allergies: No Known Allergies  Family History:  Family History  Problem Relation Age of Onset  . Stroke Mother   . Hypertension Mother   . Cancer Mother   . Diabetes Other        brothers and sisters  . Coronary artery disease Other   . Hypertension Sister   . Heart attack Sister   . Colon cancer Neg Hx      Current Outpatient Medications:  .   ACCU-CHEK SOFTCLIX LANCETS lancets, CHECK BLOOD GLUCOSE THREE TIMES DAILY, Disp: 100 each, Rfl: 0 .  acetaminophen (TYLENOL) 500 MG tablet, Take 500 mg by mouth every 8 (eight) hours as needed for mild pain or headache., Disp: , Rfl:  .  Alcohol Swabs (ALCOHOL PREP) PADS, USE TO TEST BLOOD GLUCOSE THREE TIMES DAILY, Disp: 300 each, Rfl: 3 .  amLODipine (NORVASC) 10 MG tablet, TAKE 1 TABLET EVERY DAY, Disp: 90 tablet, Rfl: 1 .  aspirin EC 81 MG tablet, Take 81 mg by mouth at bedtime., Disp: , Rfl:  .  atorvastatin (LIPITOR) 80 MG tablet, Take 1 tablet (80 mg total) by mouth daily at 6 PM., Disp: 90 tablet, Rfl: 3 .  BD PEN NEEDLE NANO U/F 32G X 4 MM MISC, INJECT TWO TIMES DAILY AS NEEDED., Disp: 180 each, Rfl: 3 .  bisacodyl (DULCOLAX) 5 MG EC tablet, Take 5 mg by mouth daily as needed for moderate constipation., Disp: , Rfl:  .  Blood Glucose Monitoring Suppl (ACCU-CHEK AVIVA PLUS) w/Device KIT, Use to check glucose levels, Disp: 1 kit, Rfl: 0 .  carvedilol (COREG) 25 MG tablet, TAKE 1 TABLET TWICE DAILY WITH MEALS, Disp: 180 tablet, Rfl: 1 .  cloNIDine (CATAPRES) 0.1 MG tablet, TAKE 1 TABLET AT BEDTIME, Disp: 90 tablet, Rfl: 1 .  COLCRYS 0.6 MG tablet, TAKE 1 TABLET EVERY DAY (Patient taking differently: as needed. ), Disp: 90 tablet, Rfl: 1 .  dorzolamide-timolol (COSOPT) 22.3-6.8 MG/ML ophthalmic solution, Place 1 drop into both eyes 2 (two) times daily., Disp: , Rfl:  .  Ferrous Sulfate (IRON) 325 (65 Fe) MG TABS, Take 1 tablet by mouth in the morning, at noon, and at bedtime., Disp: , Rfl:  .  furosemide (LASIX) 80 MG tablet, Take 1 tablet (80 mg total) by mouth daily., Disp: 180 tablet, Rfl: 3 .  glucose blood (ACCU-CHEK AVIVA PLUS) test strip, USE TO TEST BLOOD GLUCOSE THREE TIMES DAILY, Disp: 300 each, Rfl: 3 .  hydrALAZINE (APRESOLINE) 25 MG tablet, TAKE 1 TABLET THREE TIMES DAILY, Disp: 270 tablet, Rfl: 3 .  insulin aspart (NOVOLOG FLEXPEN) 100 UNIT/ML FlexPen, Inject 12 Units into the  skin 3 (three) times daily with meals. , Disp: , Rfl:  .  Insulin Glargine (LANTUS SOLOSTAR) 100 UNIT/ML Solostar Pen, Inject 12 Units into the skin daily at 10 pm. (Patient taking differently: Inject 14 Units into the skin daily at 10 pm. ), Disp: 15 mL, Rfl: 3 .  isosorbide mononitrate (IMDUR) 60 MG 24 hr tablet, Take 2 tablets (120 mg total) by mouth daily., Disp: 180 tablet, Rfl: 2 .  Lancet Devices (ACCU-CHEK SOFTCLIX) lancets, USE TO TEST BLOOD GLUCOSE THREE TIMES DAILY, Disp: 1 each, Rfl: 0 .  latanoprost (XALATAN) 0.005 % ophthalmic solution, Place 1 drop into both eyes at bedtime. , Disp: , Rfl:  .  nitroGLYCERIN (NITROSTAT) 0.4 MG SL tablet, Place 1 tablet (0.4 mg total) under the tongue every 5 (five) minutes as needed for  chest pain (3 doses MAX)., Disp: 75 tablet, Rfl: 1 .  omeprazole (PRILOSEC) 20 MG capsule, Take 1 capsule (20 mg total) by mouth daily., Disp: 90 capsule, Rfl: 4 .  OVER THE COUNTER MEDICATION, Take 1 tablet by mouth daily. Prostate formula, Disp: , Rfl:  .  pentoxifylline (TRENTAL) 400 MG CR tablet, TAKE 1 TABLET (400 MG TOTAL) BY MOUTH 2 (TWO) TIMES DAILY. (TAKE AT 10 AM AND 5 PM), Disp: 180 tablet, Rfl: 1 .  potassium chloride SA (KLOR-CON) 20 MEQ tablet, Take 2 tablets by mouth for the first dose, then take 1 tablet by mouth once a day, Disp: 10 tablet, Rfl: 0  Review of Systems:  Constitutional: Denies fever, chills, diaphoresis, appetite change and fatigue.  HEENT: Denies photophobia, eye pain, redness, hearing loss, ear pain, congestion, sore throat, rhinorrhea, sneezing, mouth sores, trouble swallowing, neck pain, neck stiffness and tinnitus.   Respiratory: Denies SOB, DOE, cough, chest tightness,  and wheezing.   Cardiovascular: Denies chest pain, palpitations and leg swelling.  Gastrointestinal: Denies nausea, vomiting, abdominal pain, diarrhea, constipation, blood in stool and abdominal distention.  Genitourinary: Denies dysuria, urgency, frequency,  hematuria, flank pain and difficulty urinating.  Endocrine: Denies: hot or cold intolerance, sweats, changes in hair or nails, polyuria, polydipsia. Musculoskeletal: Denies myalgias, back pain, joint swelling, arthralgias and gait problem.  Skin: Denies pallor, rash and wound.  Neurological: Denies dizziness, seizures, syncope, weakness, light-headedness, numbness and headaches.  Hematological: Denies adenopathy. Easy bruising, personal or family bleeding history  Psychiatric/Behavioral: Denies suicidal ideation, mood changes, confusion, nervousness, sleep disturbance and agitation    Physical Exam: Vitals:   09/05/19 0952  BP: 120/60  Pulse: (!) 51  Temp: (!) 97.2 F (36.2 C)  TempSrc: Temporal  SpO2: 97%  Weight: 153 lb 1.6 oz (69.4 kg)  Height: _0  (1.778 m)    Body mass index is 21.97 kg/m.   Constitutional: NAD, calm, comfortable Eyes: PERRL, lids and conjunctivae normal ENMT: Mucous membranes are moist.  Respiratory: clear to auscultation bilaterally, no wheezing, no crackles. Normal respiratory effort. No accessory muscle use.  Cardiovascular: Regular rate and rhythm, no murmurs / rubs / gallops. No extremity edema. .  Neurologic: Grossly intact and nonfocal. Psychiatric: Normal judgment and insight. Alert and oriented x 3. Normal mood.    Impression and Plan:  Chronic combined systolic and diastolic heart failure ([place]) -Compensated, followed by cardiology.  Type 2 diabetes mellitus with stage 4 chronic kidney disease, with long-term current use of insulin ([place])  -Very well controlled with an A1c in office today at 5.3. He has not had episodes of hypoglycemia, his lowest fasting blood sugar is ninety-four.  Vitamin B 12 deficiency  - Plan: cyanocobalamin ((VITAMIN B-12)) injection 1,000 mcg  Essential hypertension -Well-controlled on current regimen.  Dyslipidemia -Last LDL was forty-five in twenty eighteen, continue statin.  CKD (chronic kidney  disease), stage IV (HCC) -Baseline creatinine is around 3.6, he is followed by nephrology.   Patient Instructions  -Nice seeing you today!!  -See you back in 3 months.     Lelon Frohlich, MD [facility]

## 2019-09-06 ENCOUNTER — Other Ambulatory Visit (HOSPITAL_COMMUNITY): Payer: Medicare HMO

## 2019-09-10 ENCOUNTER — Ambulatory Visit (HOSPITAL_COMMUNITY): Admit: 2019-09-10 | Payer: Medicare HMO | Admitting: Gastroenterology

## 2019-09-10 ENCOUNTER — Encounter (HOSPITAL_COMMUNITY): Payer: Self-pay

## 2019-09-10 SURGERY — ESOPHAGOGASTRODUODENOSCOPY (EGD) WITH PROPOFOL
Anesthesia: Monitor Anesthesia Care

## 2019-09-18 ENCOUNTER — Other Ambulatory Visit: Payer: Self-pay | Admitting: Interventional Cardiology

## 2019-09-26 ENCOUNTER — Other Ambulatory Visit: Payer: Self-pay | Admitting: Internal Medicine

## 2019-10-03 ENCOUNTER — Encounter: Payer: Self-pay | Admitting: Gastroenterology

## 2019-10-03 ENCOUNTER — Other Ambulatory Visit (INDEPENDENT_AMBULATORY_CARE_PROVIDER_SITE_OTHER): Payer: Medicare HMO

## 2019-10-03 ENCOUNTER — Other Ambulatory Visit: Payer: Self-pay

## 2019-10-03 ENCOUNTER — Ambulatory Visit: Payer: Medicare HMO | Admitting: Gastroenterology

## 2019-10-03 VITALS — BP 122/68 | HR 54 | Ht 70.0 in | Wt 158.0 lb

## 2019-10-03 DIAGNOSIS — K2940 Chronic atrophic gastritis without bleeding: Secondary | ICD-10-CM | POA: Diagnosis not present

## 2019-10-03 DIAGNOSIS — K31A0 Gastric intestinal metaplasia, unspecified: Secondary | ICD-10-CM

## 2019-10-03 DIAGNOSIS — D500 Iron deficiency anemia secondary to blood loss (chronic): Secondary | ICD-10-CM

## 2019-10-03 DIAGNOSIS — K5520 Angiodysplasia of colon without hemorrhage: Secondary | ICD-10-CM

## 2019-10-03 DIAGNOSIS — D649 Anemia, unspecified: Secondary | ICD-10-CM | POA: Diagnosis not present

## 2019-10-03 DIAGNOSIS — K3189 Other diseases of stomach and duodenum: Secondary | ICD-10-CM | POA: Diagnosis not present

## 2019-10-03 LAB — CBC WITH DIFFERENTIAL/PLATELET
Basophils Absolute: 0 10*3/uL (ref 0.0–0.1)
Basophils Relative: 0.7 % (ref 0.0–3.0)
Eosinophils Absolute: 0.3 10*3/uL (ref 0.0–0.7)
Eosinophils Relative: 5 % (ref 0.0–5.0)
HCT: 31.2 % — ABNORMAL LOW (ref 39.0–52.0)
Hemoglobin: 10.2 g/dL — ABNORMAL LOW (ref 13.0–17.0)
Lymphocytes Relative: 9.6 % — ABNORMAL LOW (ref 12.0–46.0)
Lymphs Abs: 0.6 10*3/uL — ABNORMAL LOW (ref 0.7–4.0)
MCHC: 32.8 g/dL (ref 30.0–36.0)
MCV: 96.8 fl (ref 78.0–100.0)
Monocytes Absolute: 0.4 10*3/uL (ref 0.1–1.0)
Monocytes Relative: 6.5 % (ref 3.0–12.0)
Neutro Abs: 4.9 10*3/uL (ref 1.4–7.7)
Neutrophils Relative %: 78.2 % — ABNORMAL HIGH (ref 43.0–77.0)
Platelets: 161 10*3/uL (ref 150.0–400.0)
RBC: 3.22 Mil/uL — ABNORMAL LOW (ref 4.22–5.81)
RDW: 17.1 % — ABNORMAL HIGH (ref 11.5–15.5)
WBC: 6.3 10*3/uL (ref 4.0–10.5)

## 2019-10-03 NOTE — Patient Instructions (Signed)
If you are age 84 or older, your body mass index should be between 23-30. Your Body mass index is 22.67 kg/m. If this is out of the aforementioned range listed, please consider follow up with your Primary Care Provider.  If you are age 66 or younger, your body mass index should be between 19-25. Your Body mass index is 22.67 kg/m. If this is out of the aformentioned range listed, please consider follow up with your Primary Care Provider.   Please go to the lab in the basement of our building to have lab work done as you leave today. Hit "B" for basement when you get on the elevator.  When the doors open the lab is on your left.  We will call you with the results. Thank you.  Due to recent changes in healthcare laws, you may see the results of your imaging and laboratory studies on MyChart before your provider has had a chance to review them.  We understand that in some cases there may be results that are confusing or concerning to you. Not all laboratory results come back in the same time frame and the provider may be waiting for multiple results in order to interpret others.  Please give [place] 48 hours in order for your provider to thoroughly review all the results before contacting the office for clarification of your results.    Thank you for entrusting me with your care and for choosing [facility], Dr. Upson Cellar

## 2019-10-03 NOTE — Progress Notes (Signed)
HPI :  86 year old male who is here for a follow-up visit. He has a history of stage IV CKD, chronic anemia, CAD with cardiomyopathy and EF of 30-35%, grade III diastolic dysfunction, severe pulmonary HTN . Has been followed here in the past for anemia and duodenal AVM's, autoimmune gastritis with gastric intestinal metaplasia.  Endoscopic history: EGD 06/10/2011 - antral ulcer 56m in size. Biopsies negative for H pylori. Colonoscopy 04/2008 - 444mpolyp, segmental colitis of left colon - unclear if related to diverticulosis, biopsies nonspecific Colonoscopy - 05/12/2015 - during GI bleed, extensive diverticulosis / hemorrhoids, thought to have left sided colonic bleed EGD 10/04/16 - multiple duodenal AVMs, atrophic stomach, small hiatal hernia, irregular z-line - biopsies showed marked atrophic gastritis with intestinal metaplasia. Suspected short segment BE but biopsies negative for BE.  Hgb had been stable in the 11 gram range, last checked by [place] December 2020, but then when recently checked by nephrology in April 2021 it was down to 8.9 grams.  Was restarted on ferrous sulfate 325 mg TID. Previously Iron saturation 9%, serum iron 30, ferritin 52.  Vitamin B12 and folate levels normal.   I previously had a lengthy discussion with the patient's son LaFritz Pickerelbout options after the last visit.  I thought his anemia was multifactorial, he had significant dyspnea and was in heart failure and I felt the risks of repeat endoscopy outweigh the benefits.  We had held off on endoscopy, he has been followed with cardiology, has continued iron supplementation his hemoglobin has been stable in the nines in recent months.  He states he has dark brown stool, denies any red or black blood in his stool.  Denies any abdominal pains.  He is eating well, he denies any dysphagia, no reflux symptoms.  He is empirically on omeprazole 20 mg a day.  He does take pentoxifylline and baby aspirin.  His weights been stable.  He  does have dyspnea with exertion at baseline.  No chest pains.  He is seeing cardiology later this month.  The patient was here by himself today however I called his son LaFritz Pickerelnd put him on speaker phone during the encounter and we had a lengthy discussion about the situation.  Echo 08/05/19 - IMPRESSIONS   1. Left ventricular ejection fraction, by estimation, is 30 to 35%. The  left ventricle has moderately decreased function. The left ventricle  demonstrates regional wall motion abnormalities (see scoring  diagram/findings for description). The left  ventricular internal cavity size was mildly to moderately dilated. Left  ventricular diastolic parameters are consistent with Grade III diastolic  dysfunction (restrictive). There is moderate akinesis of the left  ventricular, entire inferior wall.  2. Right ventricular systolic function is moderately reduced. The right  ventricular size is moderately enlarged. There is severely elevated  pulmonary artery systolic pressure. The estimated right ventricular  systolic pressure is 6456.3mHg.  3. Left atrial size was moderately dilated.  4. Right atrial size was mildly dilated.  5. The mitral valve is grossly normal. Mild mitral valve regurgitation.  No evidence of mitral stenosis.  6. The aortic valve is tricuspid. Aortic valve regurgitation is mild. No  aortic stenosis is present.  7. Aortic dilatation noted. There is moderate dilatation of the ascending  aorta measuring 44 mm.      Past Medical History:  Diagnosis Date  . Arthritis    "touch in my fingers" (09/10/2014)  . Atrophic gastritis   . AVM (arteriovenous malformation) of duodenum,  acquired   . B12 deficiency anemia    "stopped taking the shots in ~ 06/2014" (09/10/2014)  . Barrett's esophagus   . CAD (coronary artery disease)    a. H/o MI in 1998;  b. s/p CABG 2003. c. 10/2014 NSTEMI/Cath: LM 75, LAD 110md, D1 95, D2 50, LCX 100ost/p, OM1 80, OM2 80, RCA 100p/m, RPDA  fills via L->L collats, LIMA->LAD ok, VG->dRCA 100, VG->OM1->OM2 99 prox to OM1 insertion->recanalated ->Tx with heparin,  VG->D1 100-->Med Rx.  . Cataract   . Chronic systolic CHF (congestive heart failure) (HCC)    a. EF 40-45% in 10/2014 (previously normal in 08/2014).  . CKD (chronic kidney disease), stage IV ([place]   . Diabetes 1.5, managed as type 1 ([place]   . Hiatal hernia 2018   endoscopy -Dr. AHavery Moros . History of stomach ulcers 2013  . Hyperlipidemia   . Hypertension   . Hypertensive heart disease   . Ischemic cardiomyopathy    a. 10/2014 Echo: EF 40-45%.  . Myocardial infarction ([facility]   . PVD (peripheral vascular disease) ([place]   . Type II diabetes mellitus ([place]      Past Surgical History:  Procedure Laterality Date  . CARDIAC CATHETERIZATION  2003;   . CARDIAC CATHETERIZATION N/A 11/07/2014   Left Heart Cath; HBelva Crome MD;   . CATARACT EXTRACTION, BILATERAL Bilateral   . CHOLECYSTECTOMY N/A 05/20/2015   Procedure: LAPAROSCOPIC CHOLECYSTECTOMY;  Surgeon: DCoralie Keens MD;  [place] [facility]  Service: General;  Laterality: N/A;  . COLONOSCOPY    . COLONOSCOPY N/A 05/12/2015   Procedure: COLONOSCOPY;  Surgeon: DMilus Banister MD;  [facility]  Service: Endoscopy;  Laterality: N/A;  . [place]  /[name]5/15/2012  . CORONARY ARTERY BYPASS GRAFT  2003   CABG X5  . ESOPHAGOGASTRODUODENOSCOPY  06/10/2011   Procedure: ESOPHAGOGASTRODUODENOSCOPY (EGD);  Surgeon: MLadene Artist MD,FACG;  [facility];  Service: Endoscopy;  Laterality: N/A;  . ESOPHAGOGASTRODUODENOSCOPY N/A 10/04/2016   Procedure: ESOPHAGOGASTRODUODENOSCOPY (EGD);  Surgeon: AManus Gunning MD;  [facility];  Service: Gastroenterology;  Laterality: N/A;   Family History  Problem Relation Age of Onset  . Stroke Mother   . Hypertension Mother   . Cancer Mother   . Diabetes Other        brothers and sisters  . Coronary artery disease Other   .  Hypertension Sister   . Heart attack Sister   . Colon cancer Neg Hx    Social History   Tobacco Use  . Smoking status: Former Smoker    Packs/day: 0.50    Years: 20.00    Pack years: 10.00    Types: Cigarettes  . Smokeless tobacco: Never Used  . Tobacco comment: "quit smoking cigarettes in the 1960s   Vaping Use  . Vaping Use: Never used  Substance Use Topics  . Alcohol use: No  . Drug use: No   Current Outpatient Medications  Medication Sig Dispense Refill  . ACCU-CHEK SOFTCLIX LANCETS lancets CHECK BLOOD GLUCOSE THREE TIMES DAILY 100 each 0  . acetaminophen (TYLENOL) 500 MG tablet Take 500 mg by mouth every 8 (eight) hours as needed for mild pain or headache.    . Alcohol Swabs (ALCOHOL PREP) PADS USE TO TEST BLOOD GLUCOSE THREE TIMES DAILY 300 each 3  . amLODipine (NORVASC) 10 MG tablet TAKE 1 TABLET EVERY DAY 90 tablet 1  . aspirin EC 81 MG tablet Take 81 mg by mouth at  bedtime.    Marland Kitchen atorvastatin (LIPITOR) 80 MG tablet Take 1 tablet (80 mg total) by mouth daily at 6 PM. 90 tablet 3  . BD PEN NEEDLE NANO U/F 32G X 4 MM MISC INJECT TWO TIMES DAILY AS NEEDED. 180 each 3  . bisacodyl (DULCOLAX) 5 MG EC tablet Take 5 mg by mouth daily as needed for moderate constipation.    . Blood Glucose Monitoring Suppl (ACCU-CHEK AVIVA PLUS) w/Device KIT Use to check glucose levels 1 kit 0  . carvedilol (COREG) 25 MG tablet TAKE 1 TABLET TWICE DAILY WITH MEALS 180 tablet 1  . cloNIDine (CATAPRES) 0.1 MG tablet TAKE 1 TABLET AT BEDTIME 90 tablet 1  . COLCRYS 0.6 MG tablet TAKE 1 TABLET EVERY DAY (Patient taking differently: as needed. ) 90 tablet 1  . dorzolamide-timolol (COSOPT) 22.3-6.8 MG/ML ophthalmic solution Place 1 drop into both eyes 2 (two) times daily.    . Ferrous Sulfate (IRON) 325 (65 Fe) MG TABS Take 1 tablet by mouth in the morning, at noon, and at bedtime.    . furosemide (LASIX) 80 MG tablet TAKE 1 TABLET EVERY DAY 90 tablet 3  . glucose blood (ACCU-CHEK AVIVA PLUS) test strip  USE TO TEST BLOOD GLUCOSE THREE TIMES DAILY 300 each 3  . hydrALAZINE (APRESOLINE) 25 MG tablet TAKE 1 TABLET THREE TIMES DAILY 270 tablet 3  . insulin aspart (NOVOLOG FLEXPEN) 100 UNIT/ML FlexPen Inject 12 Units into the skin 3 (three) times daily with meals.     . Insulin Glargine (LANTUS SOLOSTAR) 100 UNIT/ML Solostar Pen Inject 12 Units into the skin daily at 10 pm. (Patient taking differently: Inject 14 Units into the skin daily at 10 pm. ) 15 mL 3  . isosorbide mononitrate (IMDUR) 60 MG 24 hr tablet Take 2 tablets (120 mg total) by mouth daily. 180 tablet 2  . Lancet Devices (ACCU-CHEK SOFTCLIX) lancets USE TO TEST BLOOD GLUCOSE THREE TIMES DAILY 1 each 0  . latanoprost (XALATAN) 0.005 % ophthalmic solution Place 1 drop into both eyes at bedtime.     . nitroGLYCERIN (NITROSTAT) 0.4 MG SL tablet Place 1 tablet (0.4 mg total) under the tongue every 5 (five) minutes as needed for chest pain (3 doses MAX). 75 tablet 1  . omeprazole (PRILOSEC) 20 MG capsule Take 1 capsule (20 mg total) by mouth daily. 90 capsule 4  . OVER THE COUNTER MEDICATION Take 1 tablet by mouth daily. Prostate formula    . pentoxifylline (TRENTAL) 400 MG CR tablet TAKE 1 TABLET (400 MG TOTAL) BY MOUTH 2 (TWO) TIMES DAILY. (TAKE AT 10 AM AND 5 PM) 180 tablet 1  . potassium chloride SA (KLOR-CON) 20 MEQ tablet Take 2 tablets by mouth for the first dose, then take 1 tablet by mouth once a day 10 tablet 0   No current facility-administered medications for this visit.   No Known Allergies   Review of Systems: All systems reviewed and negative except where noted in HPI.    CBC Latest Ref Rng & Units 08/06/2019 07/16/2019 07/11/2019  WBC 4.0 - 10.5 K/uL 5.5 6.4 6.7  Hemoglobin 13.0 - 17.0 g/dL 9.5(L) 9.8(L) 9.2(L)  Hematocrit 39 - 52 % 28.1(L) 30.3(L) 28.1(L)  Platelets 150 - 400 K/uL 159.0 254 214.0   Lab Results  Component Value Date   CREATININE 3.65 (H) 08/05/2019   BUN 47 (H) 08/05/2019   NA 143 08/05/2019   K 3.9  08/05/2019   CL 107 (H) 08/05/2019   CO2 22  08/05/2019    Lab Results  Component Value Date   ALT 14 07/16/2019   AST 16 07/16/2019   ALKPHOS 105 07/16/2019   BILITOT 0.4 07/16/2019     Physical Exam: BP 122/68   Pulse (!) 54   Ht 5' 10"  (1.778 m)   Wt 158 lb (71.7 kg)   BMI 22.67 kg/m  Constitutional: Pleasant,well-developed, male in no acute distress. Abdominal: Soft, nondistended, nontender.  There are no masses palpable.  Extremities: no edema Lymphadenopathy: No cervical adenopathy noted. Neurological: Alert and oriented to person place and time. Skin: Skin is warm and dry. No rashes noted. Psychiatric: Normal mood and affect. Behavior is normal.   ASSESSMENT AND PLAN: 86 year old male here for reassessment the following:  Multifactorial anemia / small bowel AVMs / autoimmune gastritis / gastric intestinal metaplasia - the patient did have worsening of chronic anemia in recent months but he has never had overt GI bleeding.  I think his anemia is multifactorial likely due to a combination of occult GI tract loss, chronic kidney disease, chronic disease.  I would not be surprised if he had additional small bowel AVMs that could be contributing to this in the setting of kidney disease however again he has not had any overt bleeding.  Further he has a history of autoimmune gastritis with gastric intestinal metaplasia, is at increased risk for gastric cancer, however the overall risk remains low, he did not have any high risk lesions during his last endoscopy in 2018.  With iron supplementation his hemoglobin has remained stable.  We discussed whether or not to proceed with further endoscopic evaluation.  Given his age, and significant cardiopulmonary co-morbidities (low EF, grade III diastolic dysfunction, severe pulm HTN, chronic dyspnea), if he is not having any overt bleeding and he can maintain his hemoglobin with oral iron I think the risks of endoscopy outweigh the benefits at  this time.  He is at higher risk for anesthesia related complications from sedation and he understands that.  Alternatively he is at risk for gastric cancer without surveillance EGD however again I think likelihood of this is low and given age and comorbidities if he has no symptoms would not recommend pursuing EGD in light of the risks.  I had a lengthy discussion with the son and the patient about all of this and both were in agreement that they did not want to pursue any endoscopy right now if he is otherwise stable.  We will repeat his CBC today to ensure stability.  We will continue to monitor this and continue iron supplementation for now.  If he has any overt blood loss in the interim he will contact me.  Buffalo Cellar, MD [facility]

## 2019-10-03 NOTE — Progress Notes (Signed)
Patient will be due for CBC in Nov 2021 for IDA

## 2019-10-07 ENCOUNTER — Other Ambulatory Visit: Payer: Self-pay

## 2019-10-07 ENCOUNTER — Ambulatory Visit (INDEPENDENT_AMBULATORY_CARE_PROVIDER_SITE_OTHER): Payer: Medicare HMO | Admitting: *Deleted

## 2019-10-07 DIAGNOSIS — E538 Deficiency of other specified B group vitamins: Secondary | ICD-10-CM

## 2019-10-07 MED ORDER — CYANOCOBALAMIN 1000 MCG/ML IJ SOLN
1000.0000 ug | Freq: Once | INTRAMUSCULAR | Status: AC
  Administered 2019-10-07: 1000 ug via INTRAMUSCULAR

## 2019-10-07 NOTE — Progress Notes (Signed)
Per orders of Dr. Hernandez Acosta, injection of B 12 given by Paulina Muchmore S Peja Allender. Patient tolerated injection well. 

## 2019-10-14 DIAGNOSIS — N185 Chronic kidney disease, stage 5: Secondary | ICD-10-CM | POA: Diagnosis not present

## 2019-10-15 NOTE — Progress Notes (Signed)
Cardiology Office Note:    Date:  10/15/2019   ID:  [name], DOB 11-Apr-1931, MRN [id number]  PCP:  [name], Brandon Halsted, MD  Cardiologist:  Brandon Grooms, MD   Referring MD: [name], [name]*   No chief complaint on file.   History of Present Illness:    [name] is a 86 y.o. male with a hx of CAD, bypass graft failure with only remaining conduit LIMA to LAD by cath in September 2016, chronic kidney disease stage IV, diabetes mellitusII, chronic systolic heart failure with LVEF35-40%, peripheral arterial disease, hypertension, stomach ulcers(with GI bleeding while on Plavix),and hyperlipidemia.  [name] is doing well.  He does not have much appetite.  He is not having orthopnea, PND, chest pain, nitroglycerin use, or significant peripheral edema.  No lightheadedness episodes of fainting.  Past Medical History:  Diagnosis Date  . Arthritis    "touch in my fingers" (09/10/2014)  . Atrophic gastritis   . AVM (arteriovenous malformation) of duodenum, acquired   . B12 deficiency anemia    "stopped taking the shots in ~ 06/2014" (09/10/2014)  . Barrett's esophagus   . CAD (coronary artery disease)    a. H/o MI in 1998;  b. s/p CABG 2003. c. 10/2014 NSTEMI/Cath: LM 75, LAD 142m/d, D1 95, D2 50, LCX 100ost/p, OM1 80, OM2 80, RCA 100p/m, RPDA fills via L->L collats, LIMA->LAD ok, VG->dRCA 100, VG->OM1->OM2 99 prox to OM1 insertion->recanalated ->Tx with heparin,  VG->D1 100-->Med Rx.  . Cataract   . Chronic systolic CHF (congestive heart failure) (HCC)    a. EF 40-45% in 10/2014 (previously normal in 08/2014).  . CKD (chronic kidney disease), stage IV ([place])   . Diabetes 1.5, managed as type 1 ([place])   . Hiatal hernia 2018   endoscopy -Dr. Havery Holt  . History of stomach ulcers 2013  . Hyperlipidemia   . Hypertension   . Hypertensive heart disease   . Ischemic cardiomyopathy    a. 10/2014 Echo: EF 40-45%.  . Myocardial infarction ([place])   .  PVD (peripheral vascular disease) ([place])   . Type II diabetes mellitus ([place])     Past Surgical History:  Procedure Laterality Date  . CARDIAC CATHETERIZATION  2003;   . CARDIAC CATHETERIZATION N/A 11/07/2014   Left Heart Cath; Brandon Crome, MD;   . CATARACT EXTRACTION, BILATERAL Bilateral   . CHOLECYSTECTOMY N/A 05/20/2015   Procedure: LAPAROSCOPIC CHOLECYSTECTOMY;  Surgeon: Brandon Keens, MD;  Location: [place];  Service: General;  Laterality: N/A;  . COLONOSCOPY    . COLONOSCOPY N/A 05/12/2015   Procedure: COLONOSCOPY;  Surgeon: Brandon Banister, MD;  Location: [place];  Service: Endoscopy;  Laterality: N/A;  . [place]   [name] 07/13/2010  . CORONARY ARTERY BYPASS GRAFT  2003   CABG X5  . ESOPHAGOGASTRODUODENOSCOPY  06/10/2011   Procedure: ESOPHAGOGASTRODUODENOSCOPY (EGD);  Surgeon: Brandon Artist, MD,[name];  [facility];  Service: Endoscopy;  Laterality: N/A;  . ESOPHAGOGASTRODUODENOSCOPY N/A 10/04/2016   Procedure: ESOPHAGOGASTRODUODENOSCOPY (EGD);  Surgeon: Brandon Gunning, MD;  [facility];  Service: Gastroenterology;  Laterality: N/A;    Current Medications: No outpatient medications have been marked as taking for the 10/16/19 encounter (Appointment) with Brandon Crome, MD.     Allergies:   Patient has no known allergies.   Social History   Socioeconomic History  . Marital status: Widowed    Spouse name: Not on file  . Number of children:  Not on file  . Years of education: Not on file  . Highest education level: Not on file  Occupational History  . Occupation: Retired  Tobacco Use  . Smoking status: Former Smoker    Packs/day: 0.50    Years: 20.00    Pack years: 10.00    Types: Cigarettes  . Smokeless tobacco: Never Used  . Tobacco comment: "quit smoking cigarettes in the 1960s   Vaping Use  . Vaping Use: Never used  Substance and Sexual Activity  . Alcohol use: No  . Drug use: No  . Sexual activity: Not  Currently  Other Topics Concern  . Not on file  Social History Narrative   Pt lives alone, family nearby.   Social Determinants of Health   Financial Resource Strain: Low Risk   . Difficulty of Paying Living Expenses: Not hard at all  Food Insecurity:   . Worried About Charity fundraiser in the Last Year:   . Arboriculturist in the Last Year:   Transportation Needs: No Transportation Needs  . Lack of Transportation (Medical): No  . Lack of Transportation (Non-Medical): No  Physical Activity:   . Days of Exercise per Week:   . Minutes of Exercise per Session:   Stress:   . Feeling of Stress :   Social Connections:   . Frequency of Communication with Friends and Family:   . Frequency of Social Gatherings with Friends and Family:   . Attends Religious Services:   . Active Member of Clubs or Organizations:   . Attends Archivist Meetings:   [name] Kitchen Marital Status:      Family History: The patient's family history includes Cancer in his mother; Coronary artery disease in an other family member; Diabetes in an other family member; Heart attack in his sister; Hypertension in his mother and sister; Stroke in his mother. There is no history of Colon cancer.  ROS:   Please see the history of present illness.    Poor appetite.  Weight remains stable.  All other systems reviewed and are negative.  EKGs/Labs/Other Studies Reviewed:    The following studies were reviewed today: 2D Doppler echocardiogram in 2021: IMPRESSIONS    1. Left ventricular ejection fraction, by estimation, is 30 to 35%. The  left ventricle has moderately decreased function. The left ventricle  demonstrates regional wall motion abnormalities (see scoring  diagram/findings for description). The left  ventricular internal cavity size was mildly to moderately dilated. Left  ventricular diastolic parameters are consistent with Grade III diastolic  dysfunction (restrictive). There is moderate akinesis of  the left  ventricular, entire inferior wall.  2. Right ventricular systolic function is moderately reduced. The right  ventricular size is moderately enlarged. There is severely elevated  pulmonary artery systolic pressure. The estimated right ventricular  systolic pressure is 62.0 mmHg.  3. Left atrial size was moderately dilated.  4. Right atrial size was mildly dilated.  5. The mitral valve is grossly normal. Mild mitral valve regurgitation.  No evidence of mitral stenosis.  6. The aortic valve is tricuspid. Aortic valve regurgitation is mild. No  aortic stenosis is present.  7. Aortic dilatation noted. There is moderate dilatation of the ascending  aorta measuring 44 mm.  EKG:  EKG no new EKG for today's visit.  Recent Labs: 07/16/2019: ALT 14; Magnesium 2.4; NT-Pro BNP 11,582 08/05/2019: BUN 47; Creatinine, Ser 3.65; Potassium 3.9; Sodium 143 10/03/2019: Hemoglobin 10.2; Platelets 161.0  Recent Lipid Panel  Component Value Date/Time   CHOL 120 11/21/2018 0958   TRIG 215 (H) 11/21/2018 0958   HDL 23 (L) 11/21/2018 0958   CHOLHDL 5.2 (H) 11/21/2018 0958   CHOLHDL 4 08/02/2017 1352   VLDL 34.2 08/02/2017 1352   LDLCALC 61 11/21/2018 0958   LDLDIRECT 93.3 01/22/2013 0854    Physical Exam:    VS:  There were no vitals taken for this visit.    Wt Readings from Last 3 Encounters:  10/03/19 158 lb (71.7 kg)  09/05/19 153 lb 1.6 oz (69.4 kg)  07/31/19 152 lb (68.9 kg)     GEN: Compatible with age.  Not overweight.. No acute distress HEENT: Normal NECK: No JVD. LYMPHATICS: No lymphadenopathy CARDIAC: Irregular RR without murmur, gallop, or edema. VASCULAR:  Normal Pulses. No bruits. RESPIRATORY:  Clear to auscultation without rales, wheezing or rhonchi  ABDOMEN: Soft, non-tender, non-distended, No pulsatile mass, MUSCULOSKELETAL: No deformity  SKIN: Warm and dry NEUROLOGIC:  Alert and oriented x 3 PSYCHIATRIC:  Normal affect   ASSESSMENT:    1. Chronic combined  systolic and diastolic heart failure ([place])   2. Coronary artery disease involving 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 ([place])   3. CKD (chronic kidney disease), stage IV ([place])   4. Essential hypertension   5. Dyslipidemia   6. Educated about COVID-19 virus infection    PLAN:    In order of problems listed above:  1. Stable chronic combined systolic and diastolic heart failure without clinical volume overload. 2. Has a single conduit supplying his whole heart which includes a LIMA to the LAD. 3. Most recent creatinine in June was 3.5.  This is relatively stable.  He sees nephrology next week. 4. Blood pressure is outstanding at 136/66 mmHg. 5. Continue high intensity statin therapy to prevent ischemic cardiac and other vascular events.  Last LDL was 45 in 2020 6. Continue to be vigilant, practice medication, and take the booster when available.   Medication Adjustments/Labs and Tests Ordered: Current medicines are reviewed at length with the patient today.  Concerns regarding medicines are outlined above.  No orders of the defined types were placed in this encounter.  No orders of the defined types were placed in this encounter.   There are no Patient Instructions on file for this visit.   Signed, Brandon Grooms, MD  10/15/2019 9:01 PM    [place]

## 2019-10-16 ENCOUNTER — Encounter: Payer: Self-pay | Admitting: Interventional Cardiology

## 2019-10-16 ENCOUNTER — Other Ambulatory Visit: Payer: Self-pay

## 2019-10-16 ENCOUNTER — Ambulatory Visit: Payer: Medicare HMO | Admitting: Interventional Cardiology

## 2019-10-16 ENCOUNTER — Other Ambulatory Visit: Payer: Self-pay | Admitting: Interventional Cardiology

## 2019-10-16 ENCOUNTER — Other Ambulatory Visit: Payer: Self-pay | Admitting: Internal Medicine

## 2019-10-16 VITALS — BP 136/66 | HR 64 | Ht 70.0 in | Wt 157.0 lb

## 2019-10-16 DIAGNOSIS — E785 Hyperlipidemia, unspecified: Secondary | ICD-10-CM

## 2019-10-16 DIAGNOSIS — N184 Chronic kidney disease, stage 4 (severe): Secondary | ICD-10-CM | POA: Diagnosis not present

## 2019-10-16 DIAGNOSIS — H401132 Primary open-angle glaucoma, bilateral, moderate stage: Secondary | ICD-10-CM | POA: Diagnosis not present

## 2019-10-16 DIAGNOSIS — Z7189 Other specified counseling: Secondary | ICD-10-CM | POA: Diagnosis not present

## 2019-10-16 DIAGNOSIS — I5042 Chronic combined systolic (congestive) and diastolic (congestive) heart failure: Secondary | ICD-10-CM | POA: Diagnosis not present

## 2019-10-16 DIAGNOSIS — I25709 Atherosclerosis of coronary artery bypass graft(s), unspecified, with unspecified angina pectoris: Secondary | ICD-10-CM | POA: Diagnosis not present

## 2019-10-16 DIAGNOSIS — I10 Essential (primary) hypertension: Secondary | ICD-10-CM

## 2019-10-16 NOTE — Patient Instructions (Signed)
Medication Instructions:  Your physician recommends that you continue on your current medications as directed. Please refer to the Current Medication list given to you today.  *If you need a refill on your cardiac medications before your next appointment, please call your pharmacy*   Lab Work: None If you have labs (blood work) drawn today and your tests are completely normal, you will receive your results only by: . MyChart Message (if you have MyChart) OR . A paper copy in the mail If you have any lab test that is abnormal or we need to change your treatment, we will call you to review the results.   Testing/Procedures: None   Follow-Up: At [facility], you and your health needs are our priority.  As part of our continuing mission to provide you with exceptional heart care, we have created designated Provider Care Teams.  These Care Teams include your primary Cardiologist (physician) and Advanced Practice Providers (APPs -  Physician Assistants and Nurse Practitioners) who all work together to provide you with the care you need, when you need it.  We recommend signing up for the patient portal called "MyChart".  Sign up information is provided on this After Visit Summary.  MyChart is used to connect with patients for Virtual Visits (Telemedicine).  Patients are able to view lab/test results, encounter notes, upcoming appointments, etc.  Non-urgent messages can be sent to your provider as well.   To learn more about what you can do with MyChart, go to [email or web address].    Your next appointment:   4-6 month(s)  The format for your next appointment:   In Person  Provider:   You may see Henry W Smith III, MD or one of the following Advanced Practice Providers on your designated Care Team:    Lori Gerhardt, NP  Laura Ingold, NP  Jill McDaniel, NP    Other Instructions   

## 2019-10-23 DIAGNOSIS — D631 Anemia in chronic kidney disease: Secondary | ICD-10-CM | POA: Diagnosis not present

## 2019-10-23 DIAGNOSIS — E611 Iron deficiency: Secondary | ICD-10-CM | POA: Diagnosis not present

## 2019-10-23 DIAGNOSIS — I120 Hypertensive chronic kidney disease with stage 5 chronic kidney disease or end stage renal disease: Secondary | ICD-10-CM | POA: Diagnosis not present

## 2019-10-23 DIAGNOSIS — N185 Chronic kidney disease, stage 5: Secondary | ICD-10-CM | POA: Diagnosis not present

## 2019-10-23 DIAGNOSIS — N2581 Secondary hyperparathyroidism of renal origin: Secondary | ICD-10-CM | POA: Diagnosis not present

## 2019-10-30 DIAGNOSIS — E119 Type 2 diabetes mellitus without complications: Secondary | ICD-10-CM | POA: Diagnosis not present

## 2019-10-30 DIAGNOSIS — Z961 Presence of intraocular lens: Secondary | ICD-10-CM | POA: Diagnosis not present

## 2019-10-30 DIAGNOSIS — H401113 Primary open-angle glaucoma, right eye, severe stage: Secondary | ICD-10-CM | POA: Diagnosis not present

## 2019-10-30 LAB — HM DIABETES EYE EXAM

## 2019-10-31 ENCOUNTER — Encounter: Payer: Self-pay | Admitting: Internal Medicine

## 2019-11-03 ENCOUNTER — Other Ambulatory Visit: Payer: Self-pay | Admitting: Interventional Cardiology

## 2019-11-04 ENCOUNTER — Other Ambulatory Visit: Payer: Self-pay | Admitting: Internal Medicine

## 2019-11-11 ENCOUNTER — Other Ambulatory Visit: Payer: Self-pay

## 2019-11-11 ENCOUNTER — Ambulatory Visit (INDEPENDENT_AMBULATORY_CARE_PROVIDER_SITE_OTHER): Payer: Medicare HMO

## 2019-11-11 DIAGNOSIS — E538 Deficiency of other specified B group vitamins: Secondary | ICD-10-CM | POA: Diagnosis not present

## 2019-11-11 MED ORDER — CYANOCOBALAMIN 1000 MCG/ML IJ SOLN
1000.0000 ug | Freq: Once | INTRAMUSCULAR | Status: AC
  Administered 2019-11-11: 1000 ug via INTRAMUSCULAR

## 2019-11-11 MED ORDER — CYANOCOBALAMIN 1000 MCG/ML IJ SOLN
1000.0000 ug | Freq: Once | INTRAMUSCULAR | Status: DC
Start: 2019-11-11 — End: 2020-05-15

## 2019-11-11 NOTE — Progress Notes (Signed)
Per orders of [name], [name], MD, injection of B12 given in lEFT deltoid by Franco Collet. Patient tolerated injection well.  Lab Results  Component Value Date   VITAMINB12 1,071 (H) 03/15/2018

## 2019-11-21 ENCOUNTER — Telehealth (INDEPENDENT_AMBULATORY_CARE_PROVIDER_SITE_OTHER): Payer: Medicare HMO | Admitting: Internal Medicine

## 2019-11-21 ENCOUNTER — Other Ambulatory Visit: Payer: Self-pay

## 2019-11-21 DIAGNOSIS — M10371 Gout due to renal impairment, right ankle and foot: Secondary | ICD-10-CM | POA: Diagnosis not present

## 2019-11-21 DIAGNOSIS — M25562 Pain in left knee: Secondary | ICD-10-CM

## 2019-11-21 MED ORDER — PREDNISONE 10 MG (21) PO TBPK: ORAL_TABLET | ORAL | 0 refills | Status: DC

## 2019-11-21 NOTE — Progress Notes (Signed)
Virtual Visit via Telephone Note  I connected with [name] on 11/21/19 at  4:00 PM EDT by telephone and verified that I am speaking with the correct person using two identifiers.   I discussed the limitations, risks, security and privacy concerns of performing an evaluation and management service by telephone and the availability of in person appointments. I also discussed with the patient that there may be a patient responsible charge related to this service. The patient expressed understanding and agreed to proceed.  Location patient: home Location provider: work office Participants present for the call: patient, provider Patient did not have a visit in the prior 7 days to address this/these issue(s).   History of Present Illness:  He has scheduled this visit to discuss left knee pain.  This started about 1 week ago.  He feels the knee is swollen and is red, no injury that he can recall.  It hurts to bear weight and also excruciating to touch.  He tells me this feels like the gout that he had in his toe 1 time.  He has tried Tylenol without relief.   Observations/Objective: Patient sounds cheerful and well on the phone. I do not appreciate any increased work of breathing. Speech and thought processing are grossly intact. Patient reported vitals: None reported   Current Outpatient Medications:  .  ACCU-CHEK SOFTCLIX LANCETS lancets, CHECK BLOOD GLUCOSE THREE TIMES DAILY, Disp: 100 each, Rfl: 0 .  acetaminophen (TYLENOL) 500 MG tablet, Take 500 mg by mouth every 8 (eight) hours as needed for mild pain or headache., Disp: , Rfl:  .  Alcohol Swabs (ALCOHOL PREP) PADS, USE TO TEST BLOOD GLUCOSE THREE TIMES DAILY, Disp: 300 each, Rfl: 3 .  amLODipine (NORVASC) 10 MG tablet, TAKE 1 TABLET EVERY DAY, Disp: 90 tablet, Rfl: 1 .  aspirin EC 81 MG tablet, Take 81 mg by mouth at bedtime., Disp: , Rfl:  .  atorvastatin (LIPITOR) 80 MG tablet, TAKE 1 TABLET EVERY DAY  AT  6PM, Disp:  90 tablet, Rfl: 3 .  BD PEN NEEDLE NANO U/F 32G X 4 MM MISC, INJECT TWO TIMES DAILY AS NEEDED., Disp: 180 each, Rfl: 3 .  bisacodyl (DULCOLAX) 5 MG EC tablet, Take 5 mg by mouth daily as needed for moderate constipation., Disp: , Rfl:  .  Blood Glucose Monitoring Suppl (ACCU-CHEK AVIVA PLUS) w/Device KIT, Use to check glucose levels, Disp: 1 kit, Rfl: 0 .  carvedilol (COREG) 25 MG tablet, TAKE 1 TABLET TWICE DAILY WITH MEALS, Disp: 180 tablet, Rfl: 1 .  cloNIDine (CATAPRES) 0.1 MG tablet, TAKE 1 TABLET AT BEDTIME, Disp: 90 tablet, Rfl: 1 .  COLCRYS 0.6 MG tablet, TAKE 1 TABLET EVERY DAY, Disp: 90 tablet, Rfl: 1 .  dorzolamide-timolol (COSOPT) 22.3-6.8 MG/ML ophthalmic solution, Place 1 drop into both eyes 2 (two) times daily., Disp: , Rfl:  .  Ferrous Sulfate (IRON) 325 (65 Fe) MG TABS, Take 1 tablet by mouth in the morning, at noon, and at bedtime., Disp: , Rfl:  .  furosemide (LASIX) 80 MG tablet, TAKE 1 TABLET EVERY DAY, Disp: 90 tablet, Rfl: 3 .  glucose blood (ACCU-CHEK AVIVA PLUS) test strip, USE TO TEST BLOOD GLUCOSE THREE TIMES DAILY, Disp: 300 each, Rfl: 3 .  hydrALAZINE (APRESOLINE) 25 MG tablet, TAKE 1 TABLET THREE TIMES DAILY, Disp: 270 tablet, Rfl: 3 .  insulin aspart (NOVOLOG FLEXPEN) 100 UNIT/ML FlexPen, Inject 12 Units into the skin 3 (three) times daily with meals. ,  Disp: , Rfl:  .  insulin glargine (LANTUS SOLOSTAR) 100 UNIT/ML Solostar Pen, Inject 14 Units into the skin daily., Disp: , Rfl:  .  isosorbide mononitrate (IMDUR) 60 MG 24 hr tablet, Take 2 tablets (120 mg total) by mouth daily., Disp: 180 tablet, Rfl: 2 .  Lancet Devices (ACCU-CHEK SOFTCLIX) lancets, USE TO TEST BLOOD GLUCOSE THREE TIMES DAILY, Disp: 1 each, Rfl: 0 .  latanoprost (XALATAN) 0.005 % ophthalmic solution, Place 1 drop into both eyes at bedtime. , Disp: , Rfl:  .  nitroGLYCERIN (NITROSTAT) 0.4 MG SL tablet, Place 1 tablet (0.4 mg total) under the tongue every 5 (five) minutes as needed for chest pain (3  doses MAX)., Disp: 75 tablet, Rfl: 1 .  omeprazole (PRILOSEC) 20 MG capsule, Take 1 capsule (20 mg total) by mouth daily., Disp: 90 capsule, Rfl: 4 .  OVER THE COUNTER MEDICATION, Take 1 tablet by mouth daily. Prostate formula, Disp: , Rfl:  .  pentoxifylline (TRENTAL) 400 MG CR tablet, TAKE 1 TABLET (400 MG TOTAL) BY MOUTH 2 (TWO) TIMES DAILY. (TAKE AT 10 AM AND 5 PM), Disp: 180 tablet, Rfl: 1 .  potassium chloride SA (KLOR-CON) 20 MEQ tablet, Take 2 tablets by mouth for the first dose, then take 1 tablet by mouth once a day, Disp: 10 tablet, Rfl: 0 .  predniSONE (STERAPRED UNI-PAK 21 TAB) 10 MG (21) TBPK tablet, Take as directed, Disp: 21 tablet, Rfl: 0  Current Facility-Administered Medications:  .  cyanocobalamin ((VITAMIN B-12)) injection 1,000 mcg, 1,000 mcg, Intramuscular, Once, Burchette, Alinda Sierras, MD  Review of Systems:  Constitutional: Denies fever, chills, diaphoresis, appetite change and fatigue.  HEENT: Denies photophobia, eye pain, redness, hearing loss, ear pain, congestion, sore throat, rhinorrhea, sneezing, mouth sores, trouble swallowing, neck pain, neck stiffness and tinnitus.   Respiratory: Denies SOB, DOE, cough, chest tightness,  and wheezing.   Cardiovascular: Denies chest pain, palpitations and leg swelling.  Gastrointestinal: Denies nausea, vomiting, abdominal pain, diarrhea, constipation, blood in stool and abdominal distention.  Genitourinary: Denies dysuria, urgency, frequency, hematuria, flank pain and difficulty urinating.  Endocrine: Denies: hot or cold intolerance, sweats, changes in hair or nails, polyuria, polydipsia. Musculoskeletal: Denies myalgias, back pain. Skin: Denies pallor, rash and wound.  Neurological: Denies dizziness, seizures, syncope, weakness, light-headedness, numbness and headaches.  Hematological: Denies adenopathy. Easy bruising, personal or family bleeding history  Psychiatric/Behavioral: Denies suicidal ideation, mood changes, confusion,  nervousness, sleep disturbance and agitation   Assessment and Plan:  Acute pain of left knee  Acute gout due to renal impairment involving toe of right foot -With his swollen knee that is excruciatingly painful to touch and his prior history of gout this is most likely the diagnosis. -He will take some colchicine that he has at home, if he does not improve with this I have sent in a prednisone taper for him to try. -If he is not better in a week he will let us know so we can refer him to an orthopedist for an arthrocentesis and further management.    I discussed the assessment and treatment plan with the patient. The patient was provided an opportunity to ask questions and all were answered. The patient agreed with the plan and demonstrated an understanding of the instructions.   The patient was advised to call back or seek an in-person evaluation if the symptoms worsen or if the condition fails to improve as anticipated.  I provided 13 minutes of non-face-to-face time during this encounter.   Calogero Geisen  [name], MD [facility]

## 2019-11-27 DIAGNOSIS — H401132 Primary open-angle glaucoma, bilateral, moderate stage: Secondary | ICD-10-CM | POA: Diagnosis not present

## 2019-12-04 ENCOUNTER — Other Ambulatory Visit: Payer: Self-pay | Admitting: Physician Assistant

## 2019-12-09 ENCOUNTER — Ambulatory Visit: Payer: Medicare HMO

## 2019-12-09 ENCOUNTER — Other Ambulatory Visit: Payer: Self-pay

## 2019-12-09 ENCOUNTER — Telehealth: Payer: Self-pay

## 2019-12-09 NOTE — Telephone Encounter (Signed)
Pt message routed to PCP for review.

## 2019-12-19 ENCOUNTER — Telehealth: Payer: Self-pay | Admitting: Internal Medicine

## 2019-12-27 MED ORDER — ACCU-CHEK AVIVA PLUS W/DEVICE KIT: PACK | 0 refills | Status: AC

## 2019-12-27 NOTE — Telephone Encounter (Signed)
Rx resent.

## 2019-12-27 NOTE — Addendum Note (Signed)
Addended by: Westley Hummer B on: 12/27/2019 12:40 PM   Modules accepted: Orders

## 2019-12-27 NOTE — Telephone Encounter (Signed)
Pharmacy called in stating that they never received Rx

## 2019-12-30 ENCOUNTER — Other Ambulatory Visit: Payer: Self-pay | Admitting: *Deleted

## 2019-12-30 MED ORDER — ACCU-CHEK GUIDE VI STRP: ORAL_STRIP | 12 refills | Status: DC

## 2019-12-30 MED ORDER — ACCU-CHEK SOFTCLIX LANCETS MISC: 12 refills | Status: DC

## 2020-01-01 ENCOUNTER — Other Ambulatory Visit: Payer: Self-pay

## 2020-01-01 ENCOUNTER — Other Ambulatory Visit (INDEPENDENT_AMBULATORY_CARE_PROVIDER_SITE_OTHER): Payer: Medicare HMO

## 2020-01-01 ENCOUNTER — Telehealth: Payer: Self-pay | Admitting: *Deleted

## 2020-01-01 DIAGNOSIS — D500 Iron deficiency anemia secondary to blood loss (chronic): Secondary | ICD-10-CM

## 2020-01-01 LAB — CBC WITH DIFFERENTIAL/PLATELET
Basophils Absolute: 0 10*3/uL (ref 0.0–0.1)
Basophils Relative: 0.8 % (ref 0.0–3.0)
Eosinophils Absolute: 0.4 10*3/uL (ref 0.0–0.7)
Eosinophils Relative: 6.1 % — ABNORMAL HIGH (ref 0.0–5.0)
HCT: 30.8 % — ABNORMAL LOW (ref 39.0–52.0)
Hemoglobin: 10.4 g/dL — ABNORMAL LOW (ref 13.0–17.0)
Lymphocytes Relative: 10 % — ABNORMAL LOW (ref 12.0–46.0)
Lymphs Abs: 0.6 10*3/uL — ABNORMAL LOW (ref 0.7–4.0)
MCHC: 33.8 g/dL (ref 30.0–36.0)
MCV: 96.3 fl (ref 78.0–100.0)
Monocytes Absolute: 0.5 10*3/uL (ref 0.1–1.0)
Monocytes Relative: 7.8 % (ref 3.0–12.0)
Neutro Abs: 4.4 10*3/uL (ref 1.4–7.7)
Neutrophils Relative %: 75.3 % (ref 43.0–77.0)
Platelets: 157 10*3/uL (ref 150.0–400.0)
RBC: 3.2 Mil/uL — ABNORMAL LOW (ref 4.22–5.81)
RDW: 15.5 % (ref 11.5–15.5)
WBC: 5.9 10*3/uL (ref 4.0–10.5)

## 2020-01-01 NOTE — Telephone Encounter (Signed)
-----   Message from Roetta Sessions, [place] sent at 10/03/2019  4:33 PM EDT ----- Regarding: cbc due in November Cbc due in November for IDA. Order placed.

## 2020-01-01 NOTE — Telephone Encounter (Signed)
Patient reminded to have labs either this week or next. He verbalizes understanding.

## 2020-01-03 DIAGNOSIS — H35371 Puckering of macula, right eye: Secondary | ICD-10-CM | POA: Diagnosis not present

## 2020-01-03 DIAGNOSIS — H401113 Primary open-angle glaucoma, right eye, severe stage: Secondary | ICD-10-CM | POA: Diagnosis not present

## 2020-01-08 ENCOUNTER — Ambulatory Visit: Payer: Medicare HMO | Admitting: Internal Medicine

## 2020-01-10 ENCOUNTER — Other Ambulatory Visit: Payer: Self-pay

## 2020-01-10 ENCOUNTER — Ambulatory Visit (INDEPENDENT_AMBULATORY_CARE_PROVIDER_SITE_OTHER): Payer: Medicare HMO | Admitting: Internal Medicine

## 2020-01-10 VITALS — BP 120/60 | HR 55 | Temp 98.1°F | Wt 156.9 lb

## 2020-01-10 DIAGNOSIS — I10 Essential (primary) hypertension: Secondary | ICD-10-CM | POA: Diagnosis not present

## 2020-01-10 DIAGNOSIS — E538 Deficiency of other specified B group vitamins: Secondary | ICD-10-CM

## 2020-01-10 DIAGNOSIS — Z794 Long term (current) use of insulin: Secondary | ICD-10-CM

## 2020-01-10 DIAGNOSIS — E1122 Type 2 diabetes mellitus with diabetic chronic kidney disease: Secondary | ICD-10-CM

## 2020-01-10 DIAGNOSIS — I5042 Chronic combined systolic (congestive) and diastolic (congestive) heart failure: Secondary | ICD-10-CM | POA: Diagnosis not present

## 2020-01-10 DIAGNOSIS — N184 Chronic kidney disease, stage 4 (severe): Secondary | ICD-10-CM | POA: Diagnosis not present

## 2020-01-10 DIAGNOSIS — E119 Type 2 diabetes mellitus without complications: Secondary | ICD-10-CM

## 2020-01-10 DIAGNOSIS — E785 Hyperlipidemia, unspecified: Secondary | ICD-10-CM | POA: Diagnosis not present

## 2020-01-10 LAB — POCT GLYCOSYLATED HEMOGLOBIN (HGB A1C): Hemoglobin A1C: 5.9 % — AB (ref 4.0–5.6)

## 2020-01-10 MED ORDER — CYANOCOBALAMIN 1000 MCG/ML IJ SOLN
1000.0000 ug | Freq: Once | INTRAMUSCULAR | Status: AC
  Administered 2020-01-10: 1000 ug via INTRAMUSCULAR

## 2020-01-10 NOTE — Addendum Note (Signed)
Addended by: Westley Hummer B on: 01/10/2020 05:15 PM   Modules accepted: Orders

## 2020-01-10 NOTE — Progress Notes (Signed)
Established Patient Office Visit     This visit occurred during the SARS-CoV-2 public health emergency.  Safety protocols were in place, including screening questions prior to the visit, additional usage of staff PPE, and extensive cleaning of exam room while observing appropriate contact time as indicated for disinfecting solutions.    CC/Reason for Visit: 44-monthfollow-up chronic medical conditions  HPI: FBERNHARDT RIEMENSCHNEIDERis a 86y.o. male who is coming in today for the above mentioned reasons. Past Medical History is significant for: Chronic combined heart failure with a known ejection fraction of 30 to 35% and grade 2 diastolic dysfunction, hypertensionthat has been well controlled in the past, coronary artery disease, diabetes which is insulin-dependent,, chronic kidney disease stage IV, B12 deficiency. He has been doing well the past few months and has no acute complaints today.  He is due for his B12 injection today.  Since I last saw him he has had his flu vaccine and his Covid booster at the pharmacy.  We had a telephone call in September due to an acute onset of left knee pain, with his history of gout he was treated as such and it has completely resolved.  He has no acute complaints today.   Past Medical/Surgical History: Past Medical History:  Diagnosis Date  . Arthritis    "touch in my fingers" (09/10/2014)  . Atrophic gastritis   . AVM (arteriovenous malformation) of duodenum, acquired   . B12 deficiency anemia    "stopped taking the shots in ~ 06/2014" (09/10/2014)  . Barrett's esophagus   . CAD (coronary artery disease)    a. H/o MI in 1998;  b. s/p CABG 2003. c. 10/2014 NSTEMI/Cath: LM 75, LAD 1090m, D1 95, D2 50, LCX 100ost/p, OM1 80, OM2 80, RCA 100p/m, RPDA fills via L->L collats, LIMA->LAD ok, VG->dRCA 100, VG->OM1->OM2 99 prox to OM1 insertion->recanalated ->Tx with heparin,  VG->D1 100-->Med Rx.  . Cataract   . Chronic systolic CHF (congestive heart failure)  (HCC)    a. EF 40-45% in 10/2014 (previously normal in 08/2014).  . CKD (chronic kidney disease), stage IV ([facility]  . Diabetes 1.5, managed as type 1 ([facility]  . Hiatal hernia 2018   endoscopy -Dr. ArHavery Moros. History of stomach ulcers 2013  . Hyperlipidemia   . Hypertension   . Hypertensive heart disease   . Ischemic cardiomyopathy    a. 10/2014 Echo: EF 40-45%.  . Myocardial infarction ([facility]  . PVD (peripheral vascular disease) ([facility]  . Type II diabetes mellitus ([facility]    Past Surgical History:  Procedure Laterality Date  . CARDIAC CATHETERIZATION  2003;   . CARDIAC CATHETERIZATION N/A 11/07/2014   Left Heart Cath; HeBelva CromeMD;   . CATARACT EXTRACTION, BILATERAL Bilateral   . CHOLECYSTECTOMY N/A 05/20/2015   Procedure: LAPAROSCOPIC CHOLECYSTECTOMY;  Surgeon: DoCoralie KeensMD;  Location: [facility] Service: General;  Laterality: N/A;  . COLONOSCOPY    . COLONOSCOPY N/A 05/12/2015   Procedure: COLONOSCOPY;  Surgeon: DaMilus BanisterMD;  [facility] Service: Endoscopy;  Laterality: N/A;  . COMcEwen /nArchie Endo/15/2012  . CORONARY ARTERY BYPASS GRAFT  2003   CABG X5  . ESOPHAGOGASTRODUODENOSCOPY  06/10/2011   Procedure: ESOPHAGOGASTRODUODENOSCOPY (EGD);  Surgeon: MaLadene ArtistMD,FACG;  [facility];  Service: Endoscopy;  Laterality: N/A;  . ESOPHAGOGASTRODUODENOSCOPY N/A 10/04/2016   Procedure: ESOPHAGOGASTRODUODENOSCOPY (EGD);  Surgeon: ArManus GunningMD;  [facility];  Service: Gastroenterology;  Laterality: N/A;    Social History:  reports that he has quit smoking. His smoking use included cigarettes. He has a 10.00 pack-year smoking history. He has never used smokeless tobacco. He reports that he does not drink alcohol and does not use drugs.  Allergies: No Known Allergies  Family History:  Family History  Problem Relation Age of Onset  . Stroke Mother   . Hypertension Mother   . Cancer Mother   .  Diabetes Other        brothers and sisters  . Coronary artery disease Other   . Hypertension Sister   . Heart attack Sister   . Colon cancer Neg Hx      Current Outpatient Medications:  .  Accu-Chek Softclix Lancets lancets, CHECK BLOOD GLUCOSE once daily, Disp: 100 each, Rfl: 12 .  acetaminophen (TYLENOL) 500 MG tablet, Take 500 mg by mouth every 8 (eight) hours as needed for mild pain or headache., Disp: , Rfl:  .  Alcohol Swabs (ALCOHOL PREP) PADS, USE TO TEST BLOOD GLUCOSE THREE TIMES DAILY, Disp: 300 each, Rfl: 3 .  amLODipine (NORVASC) 10 MG tablet, TAKE 1 TABLET EVERY DAY, Disp: 90 tablet, Rfl: 1 .  aspirin EC 81 MG tablet, Take 81 mg by mouth at bedtime., Disp: , Rfl:  .  atorvastatin (LIPITOR) 80 MG tablet, TAKE 1 TABLET EVERY DAY  AT  6PM, Disp: 90 tablet, Rfl: 3 .  BD PEN NEEDLE NANO U/F 32G X 4 MM MISC, INJECT TWO TIMES DAILY AS NEEDED., Disp: 180 each, Rfl: 3 .  bisacodyl (DULCOLAX) 5 MG EC tablet, Take 5 mg by mouth daily as needed for moderate constipation., Disp: , Rfl:  .  Blood Glucose Monitoring Suppl (ACCU-CHEK AVIVA PLUS) w/Device KIT, USE AS DIRECTED, Disp: 1 kit, Rfl: 0 .  carvedilol (COREG) 25 MG tablet, TAKE 1 TABLET TWICE DAILY WITH MEALS, Disp: 180 tablet, Rfl: 1 .  cloNIDine (CATAPRES) 0.1 MG tablet, TAKE 1 TABLET AT BEDTIME, Disp: 90 tablet, Rfl: 1 .  COLCRYS 0.6 MG tablet, TAKE 1 TABLET EVERY DAY, Disp: 90 tablet, Rfl: 1 .  dorzolamide-timolol (COSOPT) 22.3-6.8 MG/ML ophthalmic solution, Place 1 drop into both eyes 2 (two) times daily., Disp: , Rfl:  .  Ferrous Sulfate (IRON) 325 (65 Fe) MG TABS, Take 1 tablet by mouth in the morning, at noon, and at bedtime., Disp: , Rfl:  .  furosemide (LASIX) 80 MG tablet, TAKE 1 TABLET EVERY DAY, Disp: 90 tablet, Rfl: 3 .  glucose blood (ACCU-CHEK GUIDE) test strip, Test once daily, Disp: 100 each, Rfl: 12 .  hydrALAZINE (APRESOLINE) 25 MG tablet, TAKE 1 TABLET THREE TIMES DAILY, Disp: 270 tablet, Rfl: 3 .  insulin aspart  (NOVOLOG FLEXPEN) 100 UNIT/ML FlexPen, Inject 12 Units into the skin 3 (three) times daily with meals. , Disp: , Rfl:  .  insulin glargine (LANTUS SOLOSTAR) 100 UNIT/ML Solostar Pen, Inject 14 Units into the skin daily., Disp: , Rfl:  .  isosorbide mononitrate (IMDUR) 60 MG 24 hr tablet, Take 2 tablets (120 mg total) by mouth daily., Disp: 180 tablet, Rfl: 2 .  Lancet Devices (ACCU-CHEK SOFTCLIX) lancets, USE TO TEST BLOOD GLUCOSE THREE TIMES DAILY, Disp: 1 each, Rfl: 0 .  latanoprost (XALATAN) 0.005 % ophthalmic solution, Place 1 drop into both eyes at bedtime. , Disp: , Rfl:  .  nitroGLYCERIN (NITROSTAT) 0.4 MG SL tablet, Place 1 tablet (0.4 mg total) under the tongue every 5 (  five) minutes as needed for chest pain (3 doses MAX)., Disp: 75 tablet, Rfl: 1 .  omeprazole (PRILOSEC) 20 MG capsule, Take 1 capsule (20 mg total) by mouth daily., Disp: 90 capsule, Rfl: 4 .  OVER THE COUNTER MEDICATION, Take 1 tablet by mouth daily. Prostate formula, Disp: , Rfl:  .  pentoxifylline (TRENTAL) 400 MG CR tablet, TAKE 1 TABLET (400 MG TOTAL) BY MOUTH 2 (TWO) TIMES DAILY. (TAKE AT 10 AM AND 5 PM), Disp: 180 tablet, Rfl: 1 .  potassium chloride SA (KLOR-CON) 20 MEQ tablet, Take 1 tablet (20 mEq total) by mouth daily., Disp: 90 tablet, Rfl: 3 .  predniSONE (STERAPRED UNI-PAK 21 TAB) 10 MG (21) TBPK tablet, Take as directed, Disp: 21 tablet, Rfl: 0  Current Facility-Administered Medications:  .  cyanocobalamin ((VITAMIN B-12)) injection 1,000 mcg, 1,000 mcg, Intramuscular, Once, Burchette, Alinda Sierras, MD  Review of Systems:  Constitutional: Denies fever, chills, diaphoresis, appetite change and fatigue.  HEENT: Denies photophobia, eye pain, redness, hearing loss, ear pain, congestion, sore throat, rhinorrhea, sneezing, mouth sores, trouble swallowing, neck pain, neck stiffness and tinnitus.   Respiratory: Denies SOB, DOE, cough, chest tightness,  and wheezing.   Cardiovascular: Denies chest pain, palpitations and  leg swelling.  Gastrointestinal: Denies nausea, vomiting, abdominal pain, diarrhea, constipation, blood in stool and abdominal distention.  Genitourinary: Denies dysuria, urgency, frequency, hematuria, flank pain and difficulty urinating.  Endocrine: Denies: hot or cold intolerance, sweats, changes in hair or nails, polyuria, polydipsia. Musculoskeletal: Denies myalgias, back pain, joint swelling, arthralgias and gait problem.  Skin: Denies pallor, rash and wound.  Neurological: Denies dizziness, seizures, syncope, weakness, light-headedness, numbness and headaches.  Hematological: Denies adenopathy. Easy bruising, personal or family bleeding history  Psychiatric/Behavioral: Denies suicidal ideation, mood changes, confusion, nervousness, sleep disturbance and agitation    Physical Exam: Vitals:   01/10/20 1058  BP: 120/60  Pulse: (!) 55  Temp: 98.1 F (36.7 C)  TempSrc: Oral  Weight: 156 lb 14.4 oz (71.2 kg)    Body mass index is 22.51 kg/m.   Constitutional: NAD, calm, comfortable Eyes: PERRL, lids and conjunctivae normal ENMT: Mucous membranes are moist.  Respiratory: clear to auscultation bilaterally, no wheezing, no crackles. Normal respiratory effort. No accessory muscle use.  Cardiovascular: Regular rate and rhythm, no murmurs / rubs / gallops. No extremity edema.  Neurologic: Grossly intact and nonfocal Psychiatric: Normal judgment and insight. Alert and oriented x 3. Normal mood.    Impression and Plan:  Type 2 diabetes mellitus with stage 4 chronic kidney disease, with long-term current use of insulin ([place])  -Very well controlled with an A1c of 5.9 today.  Chronic combined systolic and diastolic heart failure (HCC) -Compensated, followed by cardiology.  On beta-blocker, statin.  No ACE inhibitor/ARB/ARN I due to chronic kidney disease.  B12 deficiency -B12 injection administered today, continue monthly.  Dyslipidemia -Very well controlled with an LDL of 120 in  September 2020, recheck when he returns for physical.  Essential hypertension -Well-controlled on current regimen of hydralazine, Coreg, Norvasc.  CKD (chronic kidney disease), stage IV (HCC) -Baseline creatinine is around 3.880, he follows with nephrology.     Lelon Frohlich, MD [facility] at [facility]

## 2020-01-13 ENCOUNTER — Other Ambulatory Visit: Payer: Self-pay | Admitting: Internal Medicine

## 2020-01-13 ENCOUNTER — Other Ambulatory Visit: Payer: Self-pay | Admitting: Interventional Cardiology

## 2020-01-20 DIAGNOSIS — N185 Chronic kidney disease, stage 5: Secondary | ICD-10-CM | POA: Diagnosis not present

## 2020-01-29 DIAGNOSIS — E611 Iron deficiency: Secondary | ICD-10-CM | POA: Diagnosis not present

## 2020-01-29 DIAGNOSIS — D631 Anemia in chronic kidney disease: Secondary | ICD-10-CM | POA: Diagnosis not present

## 2020-01-29 DIAGNOSIS — N185 Chronic kidney disease, stage 5: Secondary | ICD-10-CM | POA: Diagnosis not present

## 2020-01-29 DIAGNOSIS — I120 Hypertensive chronic kidney disease with stage 5 chronic kidney disease or end stage renal disease: Secondary | ICD-10-CM | POA: Diagnosis not present

## 2020-01-29 DIAGNOSIS — N2581 Secondary hyperparathyroidism of renal origin: Secondary | ICD-10-CM | POA: Diagnosis not present

## 2020-02-06 DIAGNOSIS — H401133 Primary open-angle glaucoma, bilateral, severe stage: Secondary | ICD-10-CM | POA: Diagnosis not present

## 2020-02-12 ENCOUNTER — Other Ambulatory Visit: Payer: Self-pay | Admitting: Internal Medicine

## 2020-02-12 ENCOUNTER — Other Ambulatory Visit: Payer: Self-pay

## 2020-02-12 ENCOUNTER — Ambulatory Visit (INDEPENDENT_AMBULATORY_CARE_PROVIDER_SITE_OTHER): Payer: Medicare HMO | Admitting: *Deleted

## 2020-02-12 DIAGNOSIS — E538 Deficiency of other specified B group vitamins: Secondary | ICD-10-CM | POA: Diagnosis not present

## 2020-02-12 MED ORDER — CYANOCOBALAMIN 1000 MCG/ML IJ SOLN
1000.0000 ug | Freq: Once | INTRAMUSCULAR | Status: AC
  Administered 2020-02-12: 18:00:00 1000 ug via INTRAMUSCULAR

## 2020-02-12 NOTE — Progress Notes (Signed)
Per orders of Dr. Hernandez, injection of B12 given by Marylon Verno. Patient tolerated injection well.  

## 2020-02-14 ENCOUNTER — Telehealth: Payer: Self-pay | Admitting: Pharmacist

## 2020-02-14 NOTE — Progress Notes (Signed)
ERROR

## 2020-02-14 NOTE — Chronic Care Management (AMB) (Signed)
I left the patient a message about his upcoming appointment on 02-17-2020 @ 10:00 am  with the clinical pharmacist. He was asked to please have all medication on hand to review the pharmacist.  Maia Breslow, [place] Assistant [phone]

## 2020-02-17 ENCOUNTER — Ambulatory Visit: Payer: Medicare HMO | Admitting: Pharmacist

## 2020-02-17 DIAGNOSIS — E785 Hyperlipidemia, unspecified: Secondary | ICD-10-CM

## 2020-02-17 DIAGNOSIS — N184 Chronic kidney disease, stage 4 (severe): Secondary | ICD-10-CM

## 2020-02-17 DIAGNOSIS — Z794 Long term (current) use of insulin: Secondary | ICD-10-CM

## 2020-02-17 NOTE — Chronic Care Management (AMB) (Signed)
Chronic Care Management Pharmacy  Name: [name]  MRN: [id number] DOB: 09-12-1931  Initial Questions: 1. Have you seen any other providers since your last visit? NA 2. Any changes in your medicines or health? No   Chief Complaint/ HPI  [name],  86 y.o. , male presents for their Follow-Up CCM visit with the clinical pharmacist via telephone due to COVID-19 Pandemic.  PCP : [name], [name], MD  Their chronic conditions include: HTN, DM, HLD, CAD, PUD, Barrett's esophagus, Gout, CKD stage 4, vitamin B12 deficiency, iron deficiency anemia  Office Visits: 01/10/20 Lelon Frohlich, MD: Patient presented for DM 3 month follow up. No changes made. Plan for follow up in 3 months for lipid panel.   11/21/19 Estela [name], MD: Patient presented for video visit for knee pain. Prescribed prednisone.   06/04/2019- Domingo Mend, MD- Patient presented for office visit for 3 month follow up. Patient reported fatigue and feeling lightheaded. Patient instructed to check BGs when he feels those symptoms. Patient to receive vitamin B12 injection and follow up in 3 months.    Consult Visit: 02/06/20 Susa Simmonds (eye center): Patient presented for initial evaluation for glaucoma.   01/03/20 Luberta Mutter (ophthalmology): Patient presented for glaucoma follow up. Unable to access notes.  10/23/19 Pearson Grippe (nephrology): Patient presented for follow up for CKD. Unable to access notes.  10/16/19 Daneen Schick, MD (cardiology): Patient presented for HF follow up. Increased lantus to 14 units?  07/31/2019- Cardiology- Kathyrn Drown, NP- Patient presented for office visit for 2 week follow up for HF. Patient reports doing well on increased dose of Lasix 33m in the morning and 418min the evening for 3 days than 8026mnce daily. ECHO planned for 6/7. Patient prescribed 7 day course of potassium chloride 11m25mtoday and then 20mE69mereafter; pending  delivery from mail order.   07/16/2019- Cardiology- ScottRichardson Dopp patient presented for office visit for shortness of breath. Furosemide increased for 3 days to 80mg 70mhe morning and 11mg i71me evening. Labs to be obtained CMET, CBC, Mg, BNP. Follow up in 2 weeks.   07/09/2019- Gastroenterology- JessicaAlonza Bogusatient presented for office visit for anemia and duodenal AVMs. Patient has been restarted on ferrous sulfate TID. Patient to restart omeprazole 20mg da52mfor Barrett's esophagus. Patient scheduled for EGD.   07/03/2019- Nephrology- Ryan SanPearson Grippet presented for office visit. Unable to access notes.   Medications: Outpatient Encounter Medications as of 02/17/2020  Medication Sig  . Accu-Chek Softclix Lancets lancets CHECK BLOOD GLUCOSE once daily  . acetaminophen (TYLENOL) 500 MG tablet Take 500 mg by mouth every 8 (eight) hours as needed for mild pain or headache.  . Alcohol Swabs (ALCOHOL PREP) PADS USE TO TEST BLOOD GLUCOSE THREE TIMES DAILY  . amLODipine (NORVASC) 10 MG tablet TAKE 1 TABLET EVERY DAY  . aspirin EC 81 MG tablet Take 81 mg by mouth at bedtime.  . atorvaMarland Kitchentatin (LIPITOR) 80 MG tablet TAKE 1 TABLET EVERY DAY  AT  6PM  . BD PEN NEEDLE NANO U/F 32G X 4 MM MISC INJECT TWO TIMES DAILY AS NEEDED.  . bisacodyl (DULCOLAX) 5 MG EC tablet Take 5 mg by mouth daily as needed for moderate constipation.  . Blood Glucose Monitoring Suppl (ACCU-CHEK AVIVA PLUS) w/Device KIT USE AS DIRECTED  . carvedilol (COREG) 25 MG tablet TAKE 1 TABLET TWICE DAILY WITH MEALS  . cloNIDine (CATAPRES) 0.1 MG tablet TAKE 1 TABLET AT BEDTIME  .  COLCRYS 0.6 MG tablet TAKE 1 TABLET EVERY DAY  . dorzolamide-timolol (COSOPT) 22.3-6.8 MG/ML ophthalmic solution Place 1 drop into both eyes 2 (two) times daily.  . Ferrous Sulfate (IRON) 325 (65 Fe) MG TABS Take 1 tablet by mouth in the morning, at noon, and at bedtime.  . furosemide (LASIX) 80 MG tablet TAKE 1 TABLET EVERY DAY  . glucose blood  (ACCU-CHEK GUIDE) test strip Test once daily  . hydrALAZINE (APRESOLINE) 25 MG tablet TAKE 1 TABLET THREE TIMES DAILY  . insulin aspart (NOVOLOG FLEXPEN) 100 UNIT/ML FlexPen Inject 12 Units into the skin 3 (three) times daily with meals.   . insulin glargine (LANTUS SOLOSTAR) 100 UNIT/ML Solostar Pen Inject 14 Units into the skin daily.  . isosorbide mononitrate (IMDUR) 60 MG 24 hr tablet TAKE 2 TABLETS (120 MG TOTAL) BY MOUTH DAILY.  Marland Kitchen Lancet Devices (ACCU-CHEK SOFTCLIX) lancets USE TO TEST BLOOD GLUCOSE THREE TIMES DAILY  . latanoprost (XALATAN) 0.005 % ophthalmic solution Place 1 drop into both eyes at bedtime.   . nitroGLYCERIN (NITROSTAT) 0.4 MG SL tablet Place 1 tablet (0.4 mg total) under the tongue every 5 (five) minutes as needed for chest pain (3 doses MAX).  Marland Kitchen omeprazole (PRILOSEC) 20 MG capsule Take 1 capsule (20 mg total) by mouth daily.  Marland Kitchen OVER THE COUNTER MEDICATION Take 1 tablet by mouth daily. Prostate formula  . pentoxifylline (TRENTAL) 400 MG CR tablet TAKE 1 TABLET (400 MG TOTAL) BY MOUTH 2 (TWO) TIMES DAILY. (TAKE AT 10 AM AND 5 PM)  . potassium chloride SA (KLOR-CON) 20 MEQ tablet Take 1 tablet (20 mEq total) by mouth daily.  . predniSONE (STERAPRED UNI-PAK 21 TAB) 10 MG (21) TBPK tablet Take as directed   Facility-Administered Encounter Medications as of 02/17/2020  Medication  . cyanocobalamin ((VITAMIN B-12)) injection 1,000 mcg     Current Diagnosis/Assessment:  Goals Addressed            This Visit's Progress   . Pharmacy Care Plan       CARE PLAN ENTRY (see longitudinal plan of care for additional care plan information)  Current Barriers:  . Chronic Disease Management support, education, and care coordination needs related to Hypertension, Hyperlipidemia, Diabetes, Coronary Artery Disease, and Vitamin B12 deficiency, iron deficiency anemia   Hypertension BP Readings from Last 3 Encounters:  01/10/20 120/60  10/16/19 136/66  10/03/19 122/68    . Pharmacist Clinical Goal(s): o Over the next 90 days, patient will work with PharmD and providers to maintain BP goal <140/90 . Current regimen:   Amlodipine 279m, 1 tablet once daily   Carvedilol 223m 1 tablet twice daily with meals  Clonidine 0.79m66m1 tablet at bedtime   Hydralazine 53m56m tablet three times daily . Patient self care activities - Over the next 90 days, patient will: o Check BP daily, document, and provide at future appointments o Ensure daily salt intake < 2300 mg/day  Hyperlipidemia Lab Results  Component Value Date/Time   LDLCALC 61 11/21/2018 09:58 AM   LDLDIRECT 93.3 01/22/2013 08:54 AM   . Pharmacist Clinical Goal(s): o Over the next 90 days, patient will work with PharmD and providers to maintain LDL goal < 70 . Current regimen:  o Atorvastatin 80mg24mtablet daily at 6pm  o Interventions: o We discussed how triglycerides can increase when: . Eating eat too much food . Eating high-fat foods such as fried foods, red meat, chicken skin, egg yolks, high-fat dairy, butter, lard, shortening, margarine, and fast  food . Eating foods high in simple carbohydrates such as fresh and canned fruit, candy, ice cream and sweetened yogurt, sweetened drinks like juices, cereal, jams, foods and drinks with corn syrup, or sugar listed as the first ingredient . Drinking alcohol   . Patient self care activities - Over the next 90 days, patient will: o Continue current medications as instructed.   Diabetes Lab Results  Component Value Date/Time   HGBA1C 5.9 (A) 01/10/2020 11:07 AM   HGBA1C 5.3 09/05/2019 09:58 AM   HGBA1C 7.5 (H) 08/11/2018 03:19 PM   HGBA1C 7.0 (H) 08/02/2017 01:52 PM   . Pharmacist Clinical Goal(s): o Over the next 90 days, patient will work with PharmD and providers to maintain A1c goal <7% . Current regimen:   Insulin aspart (Novolog Flexpen), inject 12 units three times daily with meals  Insulin glargine (Lantus Solostar) inject 14  units once daily at 10 pm  . Interventions: o We discussed: how to recognize and treat signs of hypoglycemia . Patient self care activities - Over the next 90 days, patient will: o Check blood sugar daily, document, and provide at future appointments o Contact provider with any episodes of hypoglycemia  Coronary artery disease  . Pharmacist Clinical Goal(s) o Over the next 90 days, patient will work with PharmD and providers to prevent heart attacks . Current regimen:  . Isosorbide mononitrate 92m, 2 tablets once daily . Nitroglycerin 0.462mSL, place 1 tablet under tongue every five minutes as needed for chest pain (3 doses max)  . Aspirin 8147m1 tablet once daily . Atorvastatin 58m51m tablet once daily at 6pm  . Patient self care activities - Over the next 90 days, patient will: o Will continue current medications  Vitamin B12 deficiency . Pharmacist Clinical Goal(s) o Over the next 90 days, patient will work with PharmD and providers to maintain vitamin B-12 levels: 211 - 911 pg/mL . Current regimen:  o Cyanocobalamin 1000mc65mject once every 30 days . Interventions: o Recommend obtaining vitamin B12 level.  . Patient self care activities o Patient will continue visits for vitamin B12 injections.  Iron deficiency anemia Hemoglobin & Hematocrit     Component Value Date/Time   HGB 10.4 (L) 01/01/2020 1155   HGB 9.8 (L) 07/16/2019 1555   HCT 30.8 (L) 01/01/2020 1155   HCT 30.3 (L) 07/16/2019 1555    . Pharmacist Clinical Goal(s) o Over the next 90 days, patient will work with PharmD and providers to improve hemoglobin/ hematocrit.  . Current regimen:  o Ferrous sulfate 65mg,72m . Patient self care activities o Patient will continue current medications and follow up with nephrologist.   Medication management . Pharmacist Clinical Goal(s): o Over the next 90 days, patient will work with PharmD and providers to maintain optimal medication adherence . [facility]  . Interventions o Comprehensive medication review performed. o Continue current medication management strategy . Patient self care activities - Over the next 90 days, patient will: o Take medications as prescribed o Report any questions or concerns to PharmD and/or provider(s)  Please see past updates related to this goal by clicking on the "Past Updates" button in the selected goal          Diabetes  Patient reported BGs doing well.  He stated he is aware of log BG symptoms and denied recent episodes of hypoglycemia. Last time was > 6 months ago.  Patient endorsed he would drink coke if hypoglycemia did occur.  Recent Relevant Labs: Lab Results  Component Value Date/Time   HGBA1C 5.9 (A) 01/10/2020 11:07 AM   HGBA1C 5.3 09/05/2019 09:58 AM   HGBA1C 7.5 (H) 08/11/2018 03:19 PM   HGBA1C 7.0 (H) 08/02/2017 01:52 PM   MICROALBUR 30.8 (H) 08/02/2017 01:52 PM   MICROALBUR 94.9 (H) 03/28/2014 10:21 AM    Checking BG: Daily  Recent FBG Readings: 95-105  Patient has failed these meds in past: glipizide  Patient is currently controlled on the following medications:   Insulin aspart (Novolog Flexpen), inject 12 units three times daily with meals   Insulin glargine (Lantus Solostar) inject 14 units once daily at 10 pm   Last diabetic Eye exam:  Lab Results  Component Value Date/Time   HMDIABEYEEXA No Retinopathy 10/30/2019 12:00 AM   - patient reports has eye doctor visit on Wednesday  Last diabetic Foot exam: No results found for: HMDIABFOOTEX  - denies neuropathy/ numbness   We discussed: how to recognize and treat signs of hypoglycemia -Benefits of continuous glucose monitoring -Patient reports the lowest BG was 78 and -Discussed the importance of injecting before eating -Diet: tries to watch what he eats but does drink some soda and sweet tea  Plan Continue current medications   Heart Failure  Patient reports everything is fine.  He endorses  weighing himself everyday. Reports no big changes.   Type: Combined Systolic and Diastolic  Last ejection fraction: 30-35% (08/05/2019)   BMP Latest Ref Rng & Units 08/05/2019 07/25/2019 07/16/2019  Potassium 3.5 - 5.2 mmol/L 3.9 3.1(L) 4.2   Patient has failed these meds in past: benazepril, felodipine, labetalol, metoprolol  Patient is currently controlled on the following medications:   Carvedilol 50m, 1 tablet twice daily with meals  Clonidine 0.144m 1 tablet at bedtime   Hydralazine 2512m1 tablet three times daily  Isosorbide mononitrate 57m2m hr tablet, 2 tablet once daily   Fluid management  Furosemide 80mg69mtablet once daily   Potassium replacement  Potassium chloride 20mEq41mtablets for first dose, then take 1 tablet once daily   We discussed weighing daily; if you gain more than 3 pounds in one day or 5 pounds in one week call your doctor  -Diet: does not use salt often  Plan Continue current medications  Hypertension  BP goal < 140/90  Office blood pressures are  BP Readings from Last 3 Encounters:  01/10/20 120/60  10/16/19 136/66  10/03/19 122/68   Patient has failed these meds in the past: benazepril, felodipine, labetalol, metoprolol  Patient checks BP at home 1-2x per week  Patient home BP readings are ranging: 125-130/65-70s (no > 140)   Patient is controlled on:   Amlodipine 10mg, 55mblet once daily   Carvedilol 25mg, 132mlet twice daily with meals  Clonidine 0.1mg, 1 t75met at bedtime   Hydralazine 25mg, 1 t80mt three times daily  We discussed: timing of checking blood pressure and taking medications  Plan Continue current medications   Hyperlipidemia   LDL goal < 70  Lipid Panel     Component Value Date/Time   CHOL 120 11/21/2018 0958   TRIG 215 (H) 11/21/2018 0958   HDL 23 (L) 11/21/2018 0958   LDLCALC 61 11/21/2018 0958   LDLDIRECT 93.3 01/22/2013 0854    Hepatic Function Latest Ref Rng & Units 07/16/2019  11/21/2018 08/11/2018  Total Protein 6.0 - 8.5 g/dL 7.2 7.1 8.0  Albumin 3.6 - 4.6 g/dL 4.2 4.5 4.4  AST 0 - 40 IU/L  _0 ALT 0 - 44 IU/L _1 Alk Phosphatase 48 - 121 IU/L 105 90 74  Total Bilirubin 0.0 - 1.2 mg/dL 0.4 0.3 1.0  Bilirubin, Direct 0.00 - 0.40 mg/dL - 0.11 -     The ASCVD Risk score (Sanborn., et al., 2013) failed to calculate for the following reasons:   The 2013 ASCVD risk score is only valid for ages 14 to 55   Patient has failed these meds in past: simvastatin  Patient is currently controlled on the following medications:  . Atorvastatin 32m, 1 tablet daily at 6pm (evening)  We discussed: Discussed how triglycerides can increase when: . Eating eat too much food . Eating high-fat foods such as fried foods, red meat, chicken skin, egg yolks, high-fat dairy, butter, lard, shortening, margarine, and fast food . Eating foods high in simple carbohydrates such as fresh and canned fruit, candy, ice cream and sweetened yogurt, sweetened drinks like juices, cereal, jams, foods and drinks with corn syrup, or sugar listed as the first ingredient . Drinking alcohol   Plan Continue current medications  Recommend repeat lipid panel.   Coronary artery disease   History of MI 1998, S/p CABG 2003, NSTEMI 2016    Patient denies use of nitroglycerin- reports has not needed to use it.   Patient is currently controlled on the following medications:  . Isosorbide mononitrate 66m 2 tablets once daily . Nitroglycerin 0.6m56mL, place 1 tablet under tongue every five minutes as needed for chest pain (3 doses max) . Aspirin 42m59m tablet once daily . Atorvastatin 80mg76mtablet once daily at 6pm   Plan Continue current medications   Barrett's esophagus / PUD   Patient denies GI sx.   Patient has failed these meds in past: pantoprazole   Patient is currently controlled on the following medications:  . Omeprazole 20mg,96mapsule daily    Plan Continue current  medications  Gout   Patient reported last use of colchicine was a "long time ago" and only has on hand if needed.   Lab Results  Component Value Date   LABURIC 11.2 (H) 01/16/2017   Patient is currently controlled on the following medications:   Colchicine 0.6mg, 169mblet once daily as needed   We discussed: Avoiding/limiting foods with high purine content: . wild game, such as veal, venison, and duck . red meat . some seafood, including tuna, sardines, anchovies, herring, mussels, codfish, scallops, trout, and haddock . organ meat, such as liver, kidneys, and thymus glands, which are known as sweetbreads  Avoiding/limiting foods to allow the body to process purines more effectively: . High-fat foods: Fat holds uric acid in the kidneys, so a person should avoid fried foods, full-fat dairy products, rich desserts, and other high-fat items. . Alcohol: Beer and whiskey are high in purines, but some research shows that all alcohol consumption can raise uric acid levels. Alcohol also causes dehydration, which hampers the body's ability to flush out uric acid. . Sweetened beverages: Fructose is an ingredient in many sweetened beverages, including fruit juices and sodas, and consuming too much puts a person at risk for gout.  Plan Continue current medications  Recommend repeat uric acid.   Pain  Patient denied any specific pain. Mentions "pain due to old age"   Patient is currently controlled on the following medications:  APAP 500mg, 129mlet every eight hours as needed for mild pain or headache   Plan Continue current medications  Constipation   Patient is currently controlled on the following medications:  . Bisacodyl (Dulcolax) 53m, 1 tablet once daily as needed for moderate constipation    Plan Continue current medications   Peripheral vascular disease    Patient is currently controlled on the following medications:  . Pentoxifylline (Trental) 4030m 1 tablet twice daily  (at 10 AM and 5PM)   Plan Continue current medications  Vitamin B12 deficiency    Vitamin B-12  Date Value Ref Range Status  03/15/2018 1,071 (H) 211 - 911 pg/mL Final   Patient is currently managed on the following medications:  . Cyanocobalamin 100063minject once every 30 days    Plan Recommend vitamin B12 level.    Glaucoma    Patient is currently controlled on the following medications:   Dorzolamide/ timolol mealate eye drops, 1 drop in both eyes twice a day   Plan [facility]  CKD, Stage 4   Kidney Function Lab Results  Component Value Date/Time   CREATININE 3.65 (H) 08/05/2019 04:48 PM   CREATININE 3.45 (H) 07/25/2019 07:47 AM   CREATININE 3.25 (H) 06/19/2015 09:33 AM   CREATININE 3.36 (H) 06/03/2015 10:19 AM   GFR 23.08 (L) 08/02/2017 01:52 PM   GFRNONAA 14 (L) 08/05/2019 04:48 PM   GFRAA 16 (L) 08/05/2019 04:48 PM   K 3.9 08/05/2019 04:48 PM   K 3.1 (L) 07/25/2019 07:47 AM    Plan Managed by nephology (RyRosalia HammersContinue to monitor and adjust medications as needed.    Iron deficiency anemia    Hemoglobin & Hematocrit     Component Value Date/Time   HGB 10.4 (L) 01/01/2020 1155   HGB 9.8 (L) 07/16/2019 1555   HCT 30.8 (L) 01/01/2020 1155   HCT 30.3 (L) 07/16/2019 1555   Iron/TIBC/Ferritin/ %Sat    Component Value Date/Time   IRON 44 (L) 03/15/2018 1032   TIBC 327 03/15/2018 1032   FERRITIN 40.3 03/15/2018 1032   IRONPCTSAT 13 (L) 03/15/2018 1032   Patient is currently managed on the following medications:   Ferrous sulfate 21m21mID  Plan Continue current medications  Vaccines   Reviewed and discussed patient's vaccination history.    Immunization History  Administered Date(s) Administered  . Fluad Quad(high Dose 65+) 11/14/2018  . Influenza Split 01/07/2011, 11/26/2012  . Influenza Whole 02/28/2005, 12/07/2007, 12/19/2008, 11/27/2009  . Influenza, High Dose Seasonal PF  12/28/2015, 11/10/2019  . Influenza,inj,Quad PF,6+ Mos 11/13/2013, 11/09/2014  . Influenza-Unspecified 11/08/2016, 12/02/2017  . Moderna Sars-Covid-2 Vaccination 04/01/2019, 04/29/2019  . Pneumococcal Conjugate-13 01/28/2013  . Pneumococcal Polysaccharide-23 02/28/2005  . Tdap 01/13/2012    Plan Recommended for patient to complete a shingles vaccine at the pharmacy.   Medication Management   [facility] [facility]  [id number]: [phone]: [phone] Center[facility] [id number]: [phone]: [phone] pill box? No - patient reports taking for a long time. Does not use pill box. Reports never forgetting dose.  Pt endorses 90% compliance  We discussed: Current pharmacy is preferred with insurance plan and patient is satisfied with pharmacy services  Plan  Continue current medication management strategy  Follow up: 3 month phone visit  MadeJeni SallesarmD BCACDownsrmacist [place]-[phone]

## 2020-03-08 NOTE — Progress Notes (Signed)
Cardiology Office Note:    Date:  03/09/2020   ID:  [name], DOB December 19, 1931, MRN [id number]  PCP:  [name], [name], MD  Cardiologist:  [name], MD   Referring MD: [name], [name]*   Chief Complaint  Patient presents with  . Congestive Heart Failure  . Coronary Artery Disease    History of Present Illness:    [name] is a 87 y.o. male with a hx of CAD, bypass graft failure with only remaining conduit LIMA to LAD by cath in September 2016, chronic kidney disease stage IV, diabetes mellitusII, chronic systolic heart failure with LVEF35-40%, peripheral arterial disease, hypertension, stomach ulcers(with GI bleeding while on Plavix),and hyperlipidemia.  Mr. Trostle is doing better than expected.  He has a single conduit supplying his heart, which is a LIMA to the LAD.  He is not having angina or using nitroglycerin.  He denies orthopnea.  He sleeps on his left side.  He has some mild lower extremity swelling.  Has not been doing much walking because of the current weather.  He has been cautious in the [place] environment as well.  Past Medical History:  Diagnosis Date  . Arthritis    "touch in my fingers" (09/10/2014)  . Atrophic gastritis   . AVM (arteriovenous malformation) of duodenum, acquired   . B12 deficiency anemia    "stopped taking the shots in ~ 06/2014" (09/10/2014)  . Barrett's esophagus   . CAD (coronary artery disease)    a. H/o MI in 1998;  b. s/p CABG 2003. c. 10/2014 NSTEMI/Cath: LM 75, LAD 172md, D1 95, D2 50, LCX 100ost/p, OM1 80, OM2 80, RCA 100p/m, RPDA fills via L->L collats, LIMA->LAD ok, VG->dRCA 100, VG->OM1->OM2 99 prox to OM1 insertion->recanalated ->Tx with heparin,  VG->D1 100-->Med Rx.  . Cataract   . Chronic systolic CHF (congestive heart failure) (HCC)    a. EF 40-45% in 10/2014 (previously normal in 08/2014).  . CKD (chronic kidney disease), stage IV ([place]   . Diabetes 1.5, managed as type 1 ([place]    . Hiatal hernia 2018   endoscopy -Dr. AHavery Moros . History of stomach ulcers 2013  . Hyperlipidemia   . Hypertension   . Hypertensive heart disease   . Ischemic cardiomyopathy    a. 10/2014 Echo: EF 40-45%.  . Myocardial infarction ([place]   . PVD (peripheral vascular disease) ([place]   . Type II diabetes mellitus ([place]     Past Surgical History:  Procedure Laterality Date  . CARDIAC CATHETERIZATION  2003;   . CARDIAC CATHETERIZATION N/A 11/07/2014   Left Heart Cath; HBelva Crome MD;   . CATARACT EXTRACTION, BILATERAL Bilateral   . CHOLECYSTECTOMY N/A 05/20/2015   Procedure: LAPAROSCOPIC CHOLECYSTECTOMY;  Surgeon: DCoralie Keens MD;  [place] [facility]  Service: General;  Laterality: N/A;  . COLONOSCOPY    . COLONOSCOPY N/A 05/12/2015   Procedure: COLONOSCOPY;  Surgeon: DMilus Banister MD;  [place]  Service: Endoscopy;  Laterality: N/A;  . [place]  /[name]5/15/2012  . CORONARY ARTERY BYPASS GRAFT  2003   CABG X5  . ESOPHAGOGASTRODUODENOSCOPY  06/10/2011   Procedure: ESOPHAGOGASTRODUODENOSCOPY (EGD);  Surgeon: MLadene Artist MD,FACG;  [facility];  Service: Endoscopy;  Laterality: N/A;  . ESOPHAGOGASTRODUODENOSCOPY N/A 10/04/2016   Procedure: ESOPHAGOGASTRODUODENOSCOPY (EGD);  Surgeon: AManus Gunning MD;  [facility];  Service: Gastroenterology;  Laterality: N/A;    Current Medications: Current Meds  Medication Sig  . Accu-Chek Softclix Lancets lancets CHECK BLOOD GLUCOSE once daily  . acetaminophen (TYLENOL) 500 MG tablet Take 500 mg by mouth every 8 (eight) hours as needed for mild pain or headache.  . Alcohol Swabs (ALCOHOL PREP) PADS USE TO TEST BLOOD GLUCOSE THREE TIMES DAILY  . amLODipine (NORVASC) 10 MG tablet TAKE 1 TABLET EVERY DAY  . aspirin EC 81 MG tablet Take 81 mg by mouth at bedtime.  Marland Kitchen atorvastatin (LIPITOR) 80 MG tablet TAKE 1 TABLET EVERY DAY  AT  6PM  . BD PEN NEEDLE NANO U/F 32G X 4 MM MISC  INJECT TWO TIMES DAILY AS NEEDED.  . bisacodyl (DULCOLAX) 5 MG EC tablet Take 5 mg by mouth daily as needed for moderate constipation.  . Blood Glucose Monitoring Suppl (ACCU-CHEK AVIVA PLUS) w/Device KIT USE AS DIRECTED  . carvedilol (COREG) 25 MG tablet TAKE 1 TABLET TWICE DAILY WITH MEALS  . cloNIDine (CATAPRES) 0.1 MG tablet TAKE 1 TABLET AT BEDTIME  . COLCRYS 0.6 MG tablet TAKE 1 TABLET EVERY DAY  . dorzolamide-timolol (COSOPT) 22.3-6.8 MG/ML ophthalmic solution Place 1 drop into both eyes 2 (two) times daily.  . Ferrous Sulfate (IRON) 325 (65 Fe) MG TABS Take 1 tablet by mouth in the morning, at noon, and at bedtime.  . furosemide (LASIX) 80 MG tablet TAKE 1 TABLET EVERY DAY  . glucose blood (ACCU-CHEK GUIDE) test strip Test once daily  . hydrALAZINE (APRESOLINE) 25 MG tablet TAKE 1 TABLET THREE TIMES DAILY  . insulin aspart (NOVOLOG) 100 UNIT/ML FlexPen Inject 12 Units into the skin 3 (three) times daily with meals.   . insulin glargine (LANTUS SOLOSTAR) 100 UNIT/ML Solostar Pen Inject 14 Units into the skin daily.  . isosorbide mononitrate (IMDUR) 60 MG 24 hr tablet TAKE 2 TABLETS (120 MG TOTAL) BY MOUTH DAILY.  Marland Kitchen Lancet Devices (ACCU-CHEK SOFTCLIX) lancets USE TO TEST BLOOD GLUCOSE THREE TIMES DAILY  . latanoprost (XALATAN) 0.005 % ophthalmic solution Place 1 drop into both eyes at bedtime.   . nitroGLYCERIN (NITROSTAT) 0.4 MG SL tablet Place 1 tablet (0.4 mg total) under the tongue every 5 (five) minutes as needed for chest pain (3 doses MAX).  Marland Kitchen omeprazole (PRILOSEC) 20 MG capsule Take 1 capsule (20 mg total) by mouth daily.  Marland Kitchen OVER THE COUNTER MEDICATION Take 1 tablet by mouth daily. Prostate formula  . pentoxifylline (TRENTAL) 400 MG CR tablet TAKE 1 TABLET (400 MG TOTAL) BY MOUTH 2 (TWO) TIMES DAILY. (TAKE AT 10 AM AND 5 PM)  . potassium chloride SA (KLOR-CON) 20 MEQ tablet Take 1 tablet (20 mEq total) by mouth daily.  . predniSONE (STERAPRED UNI-PAK 21 TAB) 10 MG (21) TBPK tablet  Take as directed  . timolol (TIMOPTIC) 0.5 % ophthalmic solution Place 1 drop into both eyes in the morning and at bedtime.   Current Facility-Administered Medications for the 03/09/20 encounter (Office Visit) with Belva Crome, MD  Medication  . cyanocobalamin ((VITAMIN B-12)) injection 1,000 mcg     Allergies:   Patient has no known allergies.   Social History   Socioeconomic History  . Marital status: Widowed    Spouse name: Not on file  . Number of children: Not on file  . Years of education: Not on file  . Highest education level: Not on file  Occupational History  . Occupation: Retired  Tobacco Use  . Smoking status: Former Smoker    Packs/day: 0.50    Years: 20.00  Pack years: 10.00    Types: Cigarettes  . Smokeless tobacco: Never Used  . Tobacco comment: "quit smoking cigarettes in the 1960s   Vaping Use  . Vaping Use: Never used  Substance and Sexual Activity  . Alcohol use: No  . Drug use: No  . Sexual activity: Not Currently  Other Topics Concern  . Not on file  Social History Narrative   Pt lives alone, family nearby.   Social Determinants of Health   Financial Resource Strain: Low Risk   . Difficulty of Paying Living Expenses: Not hard at all  Food Insecurity: Not on file  Transportation Needs: No Transportation Needs  . Lack of Transportation (Medical): No  . Lack of Transportation (Non-Medical): No  Physical Activity: Not on file  Stress: Not on file  Social Connections: Not on file     Family History: The patient's family history includes Cancer in his mother; Coronary artery disease in an other family member; Diabetes in an other family member; Heart attack in his sister; Hypertension in his mother and sister; Stroke in his mother. There is no history of Colon cancer.  ROS:   Please see the history of present illness.    His appetite is not as good as he would like.  He tries to eat heart healthy.  He gets tired of the same things over and  over again.  All other systems reviewed and are negative.  EKGs/Labs/Other Studies Reviewed:    The following studies were reviewed today: No new data  EKG:  EKG not repeated at  Recent Labs: 07/16/2019: ALT 14; Magnesium 2.4; NT-Pro BNP 11,582 08/05/2019: BUN 47; Creatinine, Ser 3.65; Potassium 3.9; Sodium 143 01/01/2020: Hemoglobin 10.4; Platelets 157.0  Recent Lipid Panel    Component Value Date/Time   CHOL 120 11/21/2018 0958   TRIG 215 (H) 11/21/2018 0958   HDL 23 (L) 11/21/2018 0958   CHOLHDL 5.2 (H) 11/21/2018 0958   CHOLHDL 4 08/02/2017 1352   VLDL 34.2 08/02/2017 1352   LDLCALC 61 11/21/2018 0958   LDLDIRECT 93.3 01/22/2013 0854    Physical Exam:    VS:  BP 124/60   Pulse 61   Ht 5' 10"  (1.778 m)   Wt 158 lb (71.7 kg)   SpO2 96%   BMI 22.67 kg/m     Wt Readings from Last 3 Encounters:  03/09/20 158 lb (71.7 kg)  01/10/20 156 lb 14.4 oz (71.2 kg)  10/16/19 157 lb (71.2 kg)     GEN: Appears consistent to slightly younger than stated age.. No acute distress HEENT: Normal NECK: No JVD. LYMPHATICS: No lymphadenopathy CARDIAC: No murmur. RRR no gallop, but with trace bilateral ankle edema.,  Right greater than left VASCULAR:  Normal Pulses. No bruits. RESPIRATORY:  Clear to auscultation without rales, wheezing or rhonchi  ABDOMEN: Soft, non-tender, non-distended, No pulsatile mass, MUSCULOSKELETAL: No deformity  SKIN: Warm and dry NEUROLOGIC:  Alert and oriented x 3 PSYCHIATRIC:  Normal affect   ASSESSMENT:    1. Chronic combined systolic and diastolic heart failure ([place])   2. Coronary artery disease involving 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 ([place])   3. CKD (chronic kidney disease), stage IV ([place])   4. Essential hypertension   5. Dyslipidemia   6. Educated about COVID-19 virus infection    PLAN:    In order of problems listed above:  1. Chronic systolic heart failure, currently stable without evidence of volume overload.  On  furosemide 80 mg/day.  2. He is not having angina.  He has a single conduit to his heart, LIMA to LAD.  Angina is minimal. 3. Creatinine is 4.2.  Value from November is a progression from a creatinine of 3.65 in June. 4. Blood pressure control is excellent.  Continue current therapy. 5. Continue high intensity statin therapy. 6. Vaccinated, booster, and practicing medication.   Clinical follow-up in 6 months.     Medication Adjustments/Labs and Tests Ordered: Current medicines are reviewed at length with the patient today.  Concerns regarding medicines are outlined above.  No orders of the defined types were placed in this encounter.  No orders of the defined types were placed in this encounter.   Patient Instructions  Medication Instructions:  Your physician recommends that you continue on your current medications as directed. Please refer to the Current Medication list given to you today.  *If you need a refill on your cardiac medications before your next appointment, please call your pharmacy*   Lab Work: None If you have labs (blood work) drawn today and your tests are completely normal, you will receive your results only by: Marland Kitchen MyChart Message (if you have MyChart) OR . A paper copy in the mail If you have any lab test that is abnormal or we need to change your treatment, we will call you to review the results.   Testing/Procedures: None   Follow-Up: At [facility], you and your health needs are our priority.  As part of our continuing mission to provide you with exceptional heart care, we have created designated Provider Care Teams.  These Care Teams include your primary Cardiologist (physician) and Advanced Practice Providers (APPs -  Physician Assistants and Nurse Practitioners) who all work together to provide you with the care you need, when you need it.  We recommend signing up for the patient portal called "MyChart".  Sign up information is provided on this  After Visit Summary.  MyChart is used to connect with patients for Virtual Visits (Telemedicine).  Patients are able to view lab/test results, encounter notes, upcoming appointments, etc.  Non-urgent messages can be sent to your provider as well.   To learn more about what you can do with MyChart, go to [email or web address].    Your next appointment:   6 month(s)  The format for your next appointment:   In Person  Provider:   You may see [name], MD or one of the following Advanced Practice Providers on your designated Care Team:    Truitt Merle, NP  Cecilie Kicks, NP  Kathyrn Drown, NP    Other Instructions      Signed, [name], MD  03/09/2020 11:41 AM    [place]

## 2020-03-09 ENCOUNTER — Other Ambulatory Visit: Payer: Self-pay

## 2020-03-09 ENCOUNTER — Encounter: Payer: Self-pay | Admitting: Interventional Cardiology

## 2020-03-09 ENCOUNTER — Ambulatory Visit (INDEPENDENT_AMBULATORY_CARE_PROVIDER_SITE_OTHER): Payer: Medicare HMO | Admitting: Interventional Cardiology

## 2020-03-09 VITALS — BP 124/60 | HR 61 | Ht 70.0 in | Wt 158.0 lb

## 2020-03-09 DIAGNOSIS — I25709 Atherosclerosis of coronary artery bypass graft(s), unspecified, with unspecified angina pectoris: Secondary | ICD-10-CM | POA: Diagnosis not present

## 2020-03-09 DIAGNOSIS — I10 Essential (primary) hypertension: Secondary | ICD-10-CM | POA: Diagnosis not present

## 2020-03-09 DIAGNOSIS — N184 Chronic kidney disease, stage 4 (severe): Secondary | ICD-10-CM

## 2020-03-09 DIAGNOSIS — Z7189 Other specified counseling: Secondary | ICD-10-CM | POA: Diagnosis not present

## 2020-03-09 DIAGNOSIS — E785 Hyperlipidemia, unspecified: Secondary | ICD-10-CM | POA: Diagnosis not present

## 2020-03-09 DIAGNOSIS — I5042 Chronic combined systolic (congestive) and diastolic (congestive) heart failure: Secondary | ICD-10-CM

## 2020-03-09 NOTE — Patient Instructions (Signed)

## 2020-03-16 ENCOUNTER — Ambulatory Visit: Payer: Medicare HMO

## 2020-03-16 ENCOUNTER — Other Ambulatory Visit: Payer: Self-pay | Admitting: Internal Medicine

## 2020-03-19 DIAGNOSIS — H401133 Primary open-angle glaucoma, bilateral, severe stage: Secondary | ICD-10-CM | POA: Diagnosis not present

## 2020-03-23 ENCOUNTER — Other Ambulatory Visit: Payer: Self-pay

## 2020-03-23 ENCOUNTER — Ambulatory Visit (INDEPENDENT_AMBULATORY_CARE_PROVIDER_SITE_OTHER): Payer: Medicare HMO

## 2020-03-23 DIAGNOSIS — E538 Deficiency of other specified B group vitamins: Secondary | ICD-10-CM

## 2020-03-23 MED ORDER — CYANOCOBALAMIN 1000 MCG/ML IJ SOLN
1000.0000 ug | Freq: Once | INTRAMUSCULAR | Status: AC
  Administered 2020-03-23: 1000 ug via INTRAMUSCULAR

## 2020-03-23 NOTE — Progress Notes (Signed)
Per orders of Dr. Hernandez, injection of Cyanocobalamin 1000 mcg/mL given by [facility]. Patient tolerated injection well.  

## 2020-03-31 ENCOUNTER — Other Ambulatory Visit: Payer: Self-pay | Admitting: Internal Medicine

## 2020-04-02 ENCOUNTER — Telehealth: Payer: Self-pay

## 2020-04-02 ENCOUNTER — Other Ambulatory Visit (INDEPENDENT_AMBULATORY_CARE_PROVIDER_SITE_OTHER): Payer: Medicare HMO

## 2020-04-02 DIAGNOSIS — D500 Iron deficiency anemia secondary to blood loss (chronic): Secondary | ICD-10-CM | POA: Diagnosis not present

## 2020-04-02 LAB — CBC WITH DIFFERENTIAL/PLATELET
Basophils Absolute: 0 10*3/uL (ref 0.0–0.1)
Basophils Relative: 0.7 % (ref 0.0–3.0)
Eosinophils Absolute: 0.2 10*3/uL (ref 0.0–0.7)
Eosinophils Relative: 3.2 % (ref 0.0–5.0)
HCT: 28.4 % — ABNORMAL LOW (ref 39.0–52.0)
Hemoglobin: 9.6 g/dL — ABNORMAL LOW (ref 13.0–17.0)
Lymphocytes Relative: 9 % — ABNORMAL LOW (ref 12.0–46.0)
Lymphs Abs: 0.5 10*3/uL — ABNORMAL LOW (ref 0.7–4.0)
MCHC: 33.8 g/dL (ref 30.0–36.0)
MCV: 98.3 fl (ref 78.0–100.0)
Monocytes Absolute: 0.4 10*3/uL (ref 0.1–1.0)
Monocytes Relative: 5.9 % (ref 3.0–12.0)
Neutro Abs: 4.9 10*3/uL (ref 1.4–7.7)
Neutrophils Relative %: 81.2 % — ABNORMAL HIGH (ref 43.0–77.0)
Platelets: 127 10*3/uL — ABNORMAL LOW (ref 150.0–400.0)
RBC: 2.89 Mil/uL — ABNORMAL LOW (ref 4.22–5.81)
RDW: 14.7 % (ref 11.5–15.5)
WBC: 6 10*3/uL (ref 4.0–10.5)

## 2020-04-02 NOTE — Telephone Encounter (Signed)
-----   Message from Yevette Edwards, RN sent at 01/01/2020  4:14 PM EDT ----- Regarding: Labs Repeat CBC, order in epic

## 2020-04-02 NOTE — Telephone Encounter (Signed)
Spoke with patient to remind him that he is due for repeat labs at this time. Advised patient that no appointment is necessary, he is aware that he can stop by the lab at his convenience between 7:30 AM - 5 PM. Patient verbalized understanding and had no concerns at the end of the call.

## 2020-04-03 ENCOUNTER — Other Ambulatory Visit: Payer: Self-pay

## 2020-04-03 DIAGNOSIS — D500 Iron deficiency anemia secondary to blood loss (chronic): Secondary | ICD-10-CM

## 2020-04-09 DIAGNOSIS — H401133 Primary open-angle glaucoma, bilateral, severe stage: Secondary | ICD-10-CM | POA: Diagnosis not present

## 2020-04-10 ENCOUNTER — Ambulatory Visit: Payer: Medicare HMO | Admitting: Internal Medicine

## 2020-04-15 ENCOUNTER — Other Ambulatory Visit: Payer: Self-pay

## 2020-04-16 ENCOUNTER — Ambulatory Visit: Payer: Medicare HMO | Admitting: Internal Medicine

## 2020-04-16 ENCOUNTER — Ambulatory Visit (INDEPENDENT_AMBULATORY_CARE_PROVIDER_SITE_OTHER): Payer: Medicare HMO | Admitting: *Deleted

## 2020-04-16 DIAGNOSIS — E538 Deficiency of other specified B group vitamins: Secondary | ICD-10-CM | POA: Diagnosis not present

## 2020-04-16 MED ORDER — CYANOCOBALAMIN 1000 MCG/ML IJ SOLN
1000.0000 ug | Freq: Once | INTRAMUSCULAR | Status: AC
  Administered 2020-04-16: 1000 ug via INTRAMUSCULAR

## 2020-04-16 MED ORDER — CYANOCOBALAMIN 1000 MCG/ML IJ SOLN: 1000.0000 ug | Freq: Once | INTRAMUSCULAR | Status: DC

## 2020-04-16 NOTE — Progress Notes (Signed)
Per orders of Cory Nafziger NP, injection of B12 given by Halvor Behrend. Patient tolerated injection well. 

## 2020-04-22 ENCOUNTER — Encounter: Payer: Self-pay | Admitting: Internal Medicine

## 2020-04-22 ENCOUNTER — Ambulatory Visit (INDEPENDENT_AMBULATORY_CARE_PROVIDER_SITE_OTHER): Payer: Medicare HMO | Admitting: Internal Medicine

## 2020-04-22 ENCOUNTER — Other Ambulatory Visit: Payer: Self-pay

## 2020-04-22 VITALS — BP 130/80 | HR 61 | Temp 98.0°F | Wt 160.4 lb

## 2020-04-22 DIAGNOSIS — N184 Chronic kidney disease, stage 4 (severe): Secondary | ICD-10-CM

## 2020-04-22 DIAGNOSIS — I5042 Chronic combined systolic (congestive) and diastolic (congestive) heart failure: Secondary | ICD-10-CM | POA: Diagnosis not present

## 2020-04-22 DIAGNOSIS — E1122 Type 2 diabetes mellitus with diabetic chronic kidney disease: Secondary | ICD-10-CM

## 2020-04-22 DIAGNOSIS — Z794 Long term (current) use of insulin: Secondary | ICD-10-CM

## 2020-04-22 DIAGNOSIS — N185 Chronic kidney disease, stage 5: Secondary | ICD-10-CM

## 2020-04-22 DIAGNOSIS — I10 Essential (primary) hypertension: Secondary | ICD-10-CM

## 2020-04-22 DIAGNOSIS — E785 Hyperlipidemia, unspecified: Secondary | ICD-10-CM

## 2020-04-22 LAB — POCT GLYCOSYLATED HEMOGLOBIN (HGB A1C): Hemoglobin A1C: 5.9 % — AB (ref 4.0–5.6)

## 2020-04-22 NOTE — Progress Notes (Signed)
Established Patient Office Visit     This visit occurred during the SARS-CoV-2 public health emergency.  Safety protocols were in place, including screening questions prior to the visit, additional usage of staff PPE, and extensive cleaning of exam room while observing appropriate contact time as indicated for disinfecting solutions.    CC/Reason for Visit: Follow-up chronic medical conditions  HPI: [name] is a 87 y.o. male who is coming in today for the above mentioned reasons. Past Medical History is significant for:  Chronic combined heart failure with a known ejection fraction of 30 to 35% and grade 2 diastolic dysfunction, hypertensionthat has been well controlled in the past, coronary artery disease, diabetes which is insulin-dependent,, chronic kidney disease stage V, B12 deficiency.  He has been doing better than expected given his comorbidities.  He has no acute complaints today.  At this point I mainly see him for diabetic care.   Past Medical/Surgical History: Past Medical History:  Diagnosis Date  . Arthritis    "touch in my fingers" (09/10/2014)  . Atrophic gastritis   . AVM (arteriovenous malformation) of duodenum, acquired   . B12 deficiency anemia    "stopped taking the shots in ~ 06/2014" (09/10/2014)  . Barrett's esophagus   . CAD (coronary artery disease)    a. H/o MI in 1998;  b. s/p CABG 2003. c. 10/2014 NSTEMI/Cath: LM 75, LAD 130md, D1 95, D2 50, LCX 100ost/p, OM1 80, OM2 80, RCA 100p/m, RPDA fills via L->L collats, LIMA->LAD ok, VG->dRCA 100, VG->OM1->OM2 99 prox to OM1 insertion->recanalated ->Tx with heparin,  VG->D1 100-->Med Rx.  . Cataract   . Chronic systolic CHF (congestive heart failure) (HCC)    a. EF 40-45% in 10/2014 (previously normal in 08/2014).  . CKD (chronic kidney disease), stage IV ([place]   . Diabetes 1.5, managed as type 1 ([place]   . Hiatal hernia 2018   endoscopy -Dr. AHavery Moros . History of stomach ulcers 2013  .  Hyperlipidemia   . Hypertension   . Hypertensive heart disease   . Ischemic cardiomyopathy    a. 10/2014 Echo: EF 40-45%.  . Myocardial infarction ([place]   . PVD (peripheral vascular disease) ([place]   . Type II diabetes mellitus ([place]     Past Surgical History:  Procedure Laterality Date  . CARDIAC CATHETERIZATION  2003;   . CARDIAC CATHETERIZATION N/A 11/07/2014   Left Heart Cath; HBelva Crome MD;   . CATARACT EXTRACTION, BILATERAL Bilateral   . CHOLECYSTECTOMY N/A 05/20/2015   Procedure: LAPAROSCOPIC CHOLECYSTECTOMY;  Surgeon: DCoralie Keens MD;  [place]  Service: General;  Laterality: N/A;  . COLONOSCOPY    . COLONOSCOPY N/A 05/12/2015   Procedure: COLONOSCOPY;  Surgeon: DMilus Banister MD;  [place]  Service: Endoscopy;  Laterality: N/A;  . [place]  /[name]5/15/2012  . CORONARY ARTERY BYPASS GRAFT  2003   CABG X5  . ESOPHAGOGASTRODUODENOSCOPY  06/10/2011   Procedure: ESOPHAGOGASTRODUODENOSCOPY (EGD);  Surgeon: MLadene Artist MD,FACG;  [facility];  Service: Endoscopy;  Laterality: N/A;  . ESOPHAGOGASTRODUODENOSCOPY N/A 10/04/2016   Procedure: ESOPHAGOGASTRODUODENOSCOPY (EGD);  Surgeon: AManus Gunning MD;  [facility];  Service: Gastroenterology;  Laterality: N/A;    Social History:  reports that he has quit smoking. His smoking use included cigarettes. He has a 10.00 pack-year smoking history. He has never used smokeless tobacco. He reports that he does not drink alcohol and does not use drugs.  Allergies: No Known Allergies  Family History:  Family History  Problem Relation Age of Onset  . Stroke Mother   . Hypertension Mother   . Cancer Mother   . Diabetes Other        brothers and sisters  . Coronary artery disease Other   . Hypertension Sister   . Heart attack Sister   . Colon cancer Neg Hx      Current Outpatient Medications:  .  Accu-Chek Softclix Lancets lancets, CHECK BLOOD  GLUCOSE once daily, Disp: 100 each, Rfl: 12 .  acetaminophen (TYLENOL) 500 MG tablet, Take 500 mg by mouth every 8 (eight) hours as needed for mild pain or headache., Disp: , Rfl:  .  Alcohol Swabs (ALCOHOL PREP) PADS, USE TO TEST BLOOD GLUCOSE THREE TIMES DAILY, Disp: 300 each, Rfl: 3 .  amLODipine (NORVASC) 10 MG tablet, TAKE 1 TABLET EVERY DAY, Disp: 90 tablet, Rfl: 1 .  aspirin EC 81 MG tablet, Take 81 mg by mouth at bedtime., Disp: , Rfl:  .  atorvastatin (LIPITOR) 80 MG tablet, TAKE 1 TABLET EVERY DAY  AT  6PM, Disp: 90 tablet, Rfl: 3 .  BD PEN NEEDLE NANO U/F 32G X 4 MM MISC, INJECT TWO TIMES DAILY AS NEEDED., Disp: 180 each, Rfl: 3 .  bisacodyl (DULCOLAX) 5 MG EC tablet, Take 5 mg by mouth daily as needed for moderate constipation., Disp: , Rfl:  .  Blood Glucose Monitoring Suppl (ACCU-CHEK AVIVA PLUS) w/Device KIT, USE AS DIRECTED, Disp: 1 kit, Rfl: 0 .  carvedilol (COREG) 25 MG tablet, TAKE 1 TABLET TWICE DAILY WITH MEALS, Disp: 180 tablet, Rfl: 1 .  cloNIDine (CATAPRES) 0.1 MG tablet, TAKE 1 TABLET AT BEDTIME, Disp: 90 tablet, Rfl: 1 .  COLCRYS 0.6 MG tablet, TAKE 1 TABLET EVERY DAY, Disp: 90 tablet, Rfl: 1 .  dorzolamide-timolol (COSOPT) 22.3-6.8 MG/ML ophthalmic solution, Place 1 drop into both eyes 2 (two) times daily., Disp: , Rfl:  .  Ferrous Sulfate (IRON) 325 (65 Fe) MG TABS, Take 1 tablet by mouth in the morning, at noon, and at bedtime., Disp: , Rfl:  .  furosemide (LASIX) 80 MG tablet, TAKE 1 TABLET EVERY DAY, Disp: 90 tablet, Rfl: 3 .  glucose blood (ACCU-CHEK GUIDE) test strip, Test once daily, Disp: 100 each, Rfl: 12 .  hydrALAZINE (APRESOLINE) 25 MG tablet, TAKE 1 TABLET THREE TIMES DAILY, Disp: 270 tablet, Rfl: 3 .  insulin aspart (NOVOLOG) 100 UNIT/ML FlexPen, Inject 12 Units into the skin 3 (three) times daily with meals. , Disp: , Rfl:  .  insulin glargine (LANTUS SOLOSTAR) 100 UNIT/ML Solostar Pen, Inject 14 Units into the skin daily., Disp: , Rfl:  .  isosorbide  mononitrate (IMDUR) 60 MG 24 hr tablet, TAKE 2 TABLETS (120 MG TOTAL) BY MOUTH DAILY., Disp: 180 tablet, Rfl: 3 .  Lancet Devices (ACCU-CHEK SOFTCLIX) lancets, USE TO TEST BLOOD GLUCOSE THREE TIMES DAILY, Disp: 1 each, Rfl: 0 .  latanoprost (XALATAN) 0.005 % ophthalmic solution, Place 1 drop into both eyes at bedtime. , Disp: , Rfl:  .  nitroGLYCERIN (NITROSTAT) 0.4 MG SL tablet, Place 1 tablet (0.4 mg total) under the tongue every 5 (five) minutes as needed for chest pain (3 doses MAX)., Disp: 75 tablet, Rfl: 1 .  omeprazole (PRILOSEC) 20 MG capsule, Take 1 capsule (20 mg total) by mouth daily., Disp: 90 capsule, Rfl: 4 .  OVER THE COUNTER MEDICATION, Take 1 tablet by mouth daily. Prostate formula, Disp: , Rfl:  .  pentoxifylline (TRENTAL) 400 MG CR tablet, TAKE 1 TABLET TWICE DAILY AT 10AM AND 5PM, Disp: 180 tablet, Rfl: 1 .  potassium chloride SA (KLOR-CON) 20 MEQ tablet, Take 1 tablet (20 mEq total) by mouth daily., Disp: 90 tablet, Rfl: 3 .  predniSONE (STERAPRED UNI-PAK 21 TAB) 10 MG (21) TBPK tablet, Take as directed, Disp: 21 tablet, Rfl: 0 .  timolol (TIMOPTIC) 0.5 % ophthalmic solution, Place 1 drop into both eyes in the morning and at bedtime., Disp: , Rfl:   Current Facility-Administered Medications:  .  cyanocobalamin ((VITAMIN B-12)) injection 1,000 mcg, 1,000 mcg, Intramuscular, Once, Burchette, Alinda Sierras, MD  Review of Systems:  Constitutional: Denies fever, chills, diaphoresis, appetite change and fatigue.  HEENT: Denies photophobia, eye pain, redness, hearing loss, ear pain, congestion, sore throat, rhinorrhea, sneezing, mouth sores, trouble swallowing, neck pain, neck stiffness and tinnitus.   Respiratory: Denies SOB, DOE, cough, chest tightness,  and wheezing.   Cardiovascular: Denies chest pain, palpitations and leg swelling.  Gastrointestinal: Denies nausea, vomiting, abdominal pain, diarrhea, constipation, blood in stool and abdominal distention.  Genitourinary: Denies  dysuria, urgency, frequency, hematuria, flank pain and difficulty urinating.  Endocrine: Denies: hot or cold intolerance, sweats, changes in hair or nails, polyuria, polydipsia. Musculoskeletal: Denies myalgias, back pain, joint swelling, arthralgias and gait problem.  Skin: Denies pallor, rash and wound.  Neurological: Denies dizziness, seizures, syncope, weakness, light-headedness, numbness and headaches.  Hematological: Denies adenopathy. Easy bruising, personal or family bleeding history  Psychiatric/Behavioral: Denies suicidal ideation, mood changes, confusion, nervousness, sleep disturbance and agitation    Physical Exam: Vitals:   04/22/20 1122  BP: 130/80  Pulse: 61  Temp: 98 F (36.7 C)  TempSrc: Oral  SpO2: 98%  Weight: 160 lb 6.4 oz (72.8 kg)    Body mass index is 23.02 kg/m.  Constitutional: NAD, calm, comfortable Eyes: PERRL, lids and conjunctivae normal ENMT: Mucous membranes are moist.  Respiratory: clear to auscultation bilaterally, no wheezing, no crackles. Normal respiratory effort. No accessory muscle use.  Cardiovascular: Regular rate and rhythm, no murmurs / rubs / gallops.  2+ pitting bilateral lower extremity edema.  Neurologic: Grossly intact and nonfocal. Psychiatric: Normal judgment and insight. Alert and oriented x 3. Normal mood.    Impression and Plan:  Type 2 diabetes mellitus with stage 4 chronic kidney disease, with long-term current use of insulin (HCC)  -A1c continues to demonstrate excellent control at 5.9 today.  Dyslipidemia -He is due for repeat lipids.  Essential hypertension -Well-controlled.  CKD (chronic kidney disease) stage 5, GFR less than 15 ml/min (HCC) -Last creatinine was 4.19, followed by nephrology.  Chronic combined systolic and diastolic heart failure (HCC) -Compensated, followed by cardiology, on beta-blocker, statin, Lasix, no ACE inhibitor/ARB/ARN I due to advanced chronic kidney disease.    Lelon Frohlich, MD [facility]

## 2020-05-01 ENCOUNTER — Other Ambulatory Visit (INDEPENDENT_AMBULATORY_CARE_PROVIDER_SITE_OTHER): Payer: Medicare HMO

## 2020-05-01 ENCOUNTER — Telehealth: Payer: Self-pay

## 2020-05-01 DIAGNOSIS — D500 Iron deficiency anemia secondary to blood loss (chronic): Secondary | ICD-10-CM | POA: Diagnosis not present

## 2020-05-01 LAB — CBC WITH DIFFERENTIAL/PLATELET
Basophils Absolute: 0 10*3/uL (ref 0.0–0.1)
Basophils Relative: 0.8 % (ref 0.0–3.0)
Eosinophils Absolute: 0.2 10*3/uL (ref 0.0–0.7)
Eosinophils Relative: 4.3 % (ref 0.0–5.0)
HCT: 27.6 % — ABNORMAL LOW (ref 39.0–52.0)
Hemoglobin: 9.3 g/dL — ABNORMAL LOW (ref 13.0–17.0)
Lymphocytes Relative: 9.5 % — ABNORMAL LOW (ref 12.0–46.0)
Lymphs Abs: 0.5 10*3/uL — ABNORMAL LOW (ref 0.7–4.0)
MCHC: 33.8 g/dL (ref 30.0–36.0)
MCV: 97.2 fl (ref 78.0–100.0)
Monocytes Absolute: 0.5 10*3/uL (ref 0.1–1.0)
Monocytes Relative: 8.2 % (ref 3.0–12.0)
Neutro Abs: 4.4 10*3/uL (ref 1.4–7.7)
Neutrophils Relative %: 77.2 % — ABNORMAL HIGH (ref 43.0–77.0)
Platelets: 141 10*3/uL — ABNORMAL LOW (ref 150.0–400.0)
RBC: 2.84 Mil/uL — ABNORMAL LOW (ref 4.22–5.81)
RDW: 15.1 % (ref 11.5–15.5)
WBC: 5.7 10*3/uL (ref 4.0–10.5)

## 2020-05-01 NOTE — Telephone Encounter (Signed)
Spoke with patient to remind him that he is due for repeat labs at this time. Patient is aware that no appointment is necessary. Advised patient that he can stop by at his convenience between 7:30 AM - 5 PM, Monday through Friday. Patient verbalized understanding and had no concerns at the end of the call.

## 2020-05-01 NOTE — Telephone Encounter (Signed)
-----   Message from Yevette Edwards, RN sent at 04/03/2020 11:11 AM EST ----- Regarding: Labs Repeat CBC, order in epic.

## 2020-05-04 ENCOUNTER — Telehealth: Payer: Self-pay | Admitting: Gastroenterology

## 2020-05-04 ENCOUNTER — Other Ambulatory Visit: Payer: Self-pay

## 2020-05-04 DIAGNOSIS — D649 Anemia, unspecified: Secondary | ICD-10-CM

## 2020-05-04 NOTE — Telephone Encounter (Signed)
error 

## 2020-05-06 ENCOUNTER — Other Ambulatory Visit: Payer: Self-pay | Admitting: Internal Medicine

## 2020-05-14 ENCOUNTER — Ambulatory Visit: Payer: Medicare HMO

## 2020-05-14 DIAGNOSIS — H401133 Primary open-angle glaucoma, bilateral, severe stage: Secondary | ICD-10-CM | POA: Diagnosis not present

## 2020-05-15 ENCOUNTER — Ambulatory Visit (INDEPENDENT_AMBULATORY_CARE_PROVIDER_SITE_OTHER): Payer: Medicare HMO | Admitting: *Deleted

## 2020-05-15 ENCOUNTER — Other Ambulatory Visit: Payer: Self-pay

## 2020-05-15 ENCOUNTER — Telehealth: Payer: Self-pay | Admitting: Internal Medicine

## 2020-05-15 DIAGNOSIS — E538 Deficiency of other specified B group vitamins: Secondary | ICD-10-CM

## 2020-05-15 MED ORDER — CYANOCOBALAMIN 1000 MCG/ML IJ SOLN
1000.0000 ug | Freq: Once | INTRAMUSCULAR | Status: AC
  Administered 2020-05-15: 1000 ug via INTRAMUSCULAR

## 2020-05-15 NOTE — Progress Notes (Signed)
Per orders of Dr. Hernandez, injection of B12 given by Rachel Vereen. Patient tolerated injection well.  

## 2020-05-15 NOTE — Telephone Encounter (Signed)
Patient dropped off [place] Form  Call to pickup: [phone] or [phone]  Disposition: Dr's [facility] [employer] [facility]

## 2020-05-19 ENCOUNTER — Ambulatory Visit (INDEPENDENT_AMBULATORY_CARE_PROVIDER_SITE_OTHER): Payer: Medicare HMO | Admitting: Pharmacist

## 2020-05-19 ENCOUNTER — Telehealth: Payer: Self-pay

## 2020-05-19 DIAGNOSIS — N184 Chronic kidney disease, stage 4 (severe): Secondary | ICD-10-CM

## 2020-05-19 DIAGNOSIS — E1122 Type 2 diabetes mellitus with diabetic chronic kidney disease: Secondary | ICD-10-CM | POA: Diagnosis not present

## 2020-05-19 DIAGNOSIS — I10 Essential (primary) hypertension: Secondary | ICD-10-CM

## 2020-05-19 DIAGNOSIS — Z794 Long term (current) use of insulin: Secondary | ICD-10-CM | POA: Diagnosis not present

## 2020-05-19 DIAGNOSIS — E785 Hyperlipidemia, unspecified: Secondary | ICD-10-CM

## 2020-05-19 NOTE — Telephone Encounter (Signed)
Spoke with patient to remind him that he is due for repeat lab work at this time. He is aware that no appointment is necessary. Patient is aware that he can stop by the lab in the basement of our office between 7:30 AM - 5 PM, Monday through Friday. Patient verbalized understanding and had no concerns at the end of the call.

## 2020-05-19 NOTE — Telephone Encounter (Signed)
-----   Message from Yevette Edwards, RN sent at 05/04/2020  1:24 PM EST ----- Regarding: Labs Repeat Hgb, IBC + Ferritin. Order in epic.

## 2020-05-19 NOTE — Progress Notes (Signed)
Chronic Care Management Pharmacy Note  05/27/2020 Name:  [name] MRN:  [id number] DOB:  1931/08/03  Subjective: [name] is an 87 y.o. year old male who is a primary patient of [name], [name], MD.  The CCM team was consulted for assistance with disease management and care coordination needs.    Engaged with patient by telephone for follow up visit in response to provider referral for pharmacy case management and/or care coordination services.   Consent to Services:  The patient was given information about Chronic Care Management services, agreed to services, and gave verbal consent prior to initiation of services.  Please see initial visit note for detailed documentation.   Patient Care Team: [name], [name], MD as PCP - General (Internal Medicine) Belva Crome, MD as PCP - Cardiology (Cardiology) Viona Gilmore, [facility] as Pharmacist (Pharmacist)  Recent office visits: 05/15/20 Patient presented for vitamin B12 injection.   04/22/20 Domingo Mend, MD: Patient presented for diabetes follow up. No changes made.  01/10/20 Estela [name], MD: Patient presented for DM 3 month follow up. No changes made. Plan for follow up in 3 months for lipid panel.   11/21/19 Estela [name], MD: Patient presented for video visit for knee pain. Prescribed prednisone.   Recent consult visits: 05/15/19 Luberta Mutter, MD (ophthalmology): Patient presented for follow up. Continued ophthalmic regimen.  04/09/20 Elza Rafter, MD (ophthalmology): Patient presented for trabeculosplasty.  03/09/20 Daneen Schick, MD (cardiology): Patient presented for CHF and CAD follow up.  02/06/20 Modena Slater [facility] (eye center): Patient presented for initial evaluation for glaucoma.   01/03/20 Luberta Mutter (ophthalmology): Patient presented for glaucoma follow up. Unable to access notes.  10/23/19 Pearson Grippe (nephrology): Patient presented for follow  up for CKD. Unable to access notes.  Hospital visits: None in previous 6 months  Objective:  Lab Results  Component Value Date   CREATININE 3.65 (H) 08/05/2019   BUN 47 (H) 08/05/2019   GFR 23.08 (L) 08/02/2017   GFRNONAA 14 (L) 08/05/2019   GFRAA 16 (L) 08/05/2019   NA 143 08/05/2019   K 3.9 08/05/2019   CALCIUM 9.2 08/05/2019   CO2 22 08/05/2019   GLUCOSE 144 (H) 08/05/2019    Lab Results  Component Value Date/Time   HGBA1C 5.9 (A) 04/22/2020 11:27 AM   HGBA1C 5.9 (A) 01/10/2020 11:07 AM   HGBA1C 7.5 (H) 08/11/2018 03:19 PM   HGBA1C 7.0 (H) 08/02/2017 01:52 PM   GFR 23.08 (L) 08/02/2017 01:52 PM   GFR 21.53 (L) 11/17/2016 11:29 AM   MICROALBUR 30.8 (H) 08/02/2017 01:52 PM   MICROALBUR 94.9 (H) 03/28/2014 10:21 AM    Last diabetic Eye exam:  Lab Results  Component Value Date/Time   HMDIABEYEEXA No Retinopathy 10/30/2019 12:00 AM    Last diabetic Foot exam: No results found for: HMDIABFOOTEX   Lab Results  Component Value Date   CHOL 120 11/21/2018   HDL 23 (L) 11/21/2018   LDLCALC 61 11/21/2018   LDLDIRECT 93.3 01/22/2013   TRIG 215 (H) 11/21/2018   CHOLHDL 5.2 (H) 11/21/2018    Hepatic Function Latest Ref Rng & Units 07/16/2019 11/21/2018 08/11/2018  Total Protein 6.0 - 8.5 g/dL 7.2 7.1 8.0  Albumin 3.6 - 4.6 g/dL 4.2 4.5 4.4  AST 0 - 40 IU/L 16 15 19   ALT 0 - 44 IU/L 14 14 20   Alk Phosphatase 48 - 121 IU/L 105 90 74  Total Bilirubin 0.0 - 1.2 mg/dL 0.4 0.3 1.0  Bilirubin, Direct 0.00 - 0.40 mg/dL - 0.11 -    Lab Results  Component Value Date/Time   TSH 4.84 (H) 08/02/2017 01:52 PM   TSH 2.846 12/29/2014 07:42 PM   TSH 3.608 09/10/2014 08:33 PM   TSH 4.99 (H) 03/28/2014 10:21 AM   FREET4 0.61 09/10/2014 08:33 PM    CBC Latest Ref Rng & Units 05/21/2020 05/01/2020 04/02/2020  WBC 4.0 - 10.5 K/uL - 5.7 6.0  Hemoglobin 13.0 - 17.0 g/dL 10.3(L) 9.3(L) 9.6(L)  Hematocrit 39.0 - 52.0 % - 27.6(L) 28.4(L)  Platelets 150.0 - 400.0 K/uL - 141.0(L) 127.0(L)     No results found for: VD25OH  Clinical ASCVD: Yes  The ASCVD Risk score Mikey Bussing DC Jr., et al., 2013) failed to calculate for the following reasons:   The 2013 ASCVD risk score is only valid for ages 90 to 109    Depression screen PHQ 2/9 11/21/2019 09/13/2018 09/13/2018  Decreased Interest 0 0 0  Down, Depressed, Hopeless 0 0 0  PHQ - 2 Score 0 0 0  Altered sleeping 0 0 -  Tired, decreased energy 0 0 -  Change in appetite 0 0 -  Feeling bad or failure about yourself  0 0 -  Trouble concentrating 0 0 -  Moving slowly or fidgety/restless 0 0 -  Suicidal thoughts 0 0 -  PHQ-9 Score 0 0 -  Difficult doing work/chores Not difficult at all - -  Some recent data might be hidden      Social History   Tobacco Use  Smoking Status Former Smoker  . Packs/day: 0.50  . Years: 20.00  . Pack years: 10.00  . Types: Cigarettes  Smokeless Tobacco Never Used  Tobacco Comment   "quit smoking cigarettes in the 1960s    BP Readings from Last 3 Encounters:  04/22/20 130/80  03/09/20 124/60  01/10/20 120/60   Pulse Readings from Last 3 Encounters:  04/22/20 61  03/09/20 61  01/10/20 (!) 55   Wt Readings from Last 3 Encounters:  04/22/20 160 lb 6.4 oz (72.8 kg)  03/09/20 158 lb (71.7 kg)  01/10/20 156 lb 14.4 oz (71.2 kg)   BMI Readings from Last 3 Encounters:  04/22/20 23.02 kg/m  03/09/20 22.67 kg/m  01/10/20 22.51 kg/m    Assessment/Interventions: Review of patient past medical history, allergies, medications, health status, including review of consultants reports, laboratory and other test data, was performed as part of comprehensive evaluation and provision of chronic care management services.   SDOH:  (Social Determinants of Health) assessments and interventions performed: No   CCM Care Plan  No Known Allergies  Medications Reviewed Today    Reviewed by [name], [name], MD (Physician) on 04/22/20 at 1137  Med List Status: <None>  Medication Order Taking?  Sig Documenting Provider Last Dose Status Informant  Accu-Chek Softclix Lancets lancets [id number] Yes CHECK BLOOD GLUCOSE once daily [name], [name], MD Taking Active   acetaminophen (TYLENOL) 500 MG tablet [id number] Yes Take 500 mg by mouth every 8 (eight) hours as needed for mild pain or headache. [provider] Taking Active Self  Alcohol Swabs (ALCOHOL PREP) PADS [id number] Yes USE TO TEST BLOOD GLUCOSE THREE TIMES DAILY Marletta Lor, MD Taking Active Self  amLODipine (NORVASC) 10 MG tablet [id number] Yes TAKE 1 TABLET EVERY DAY [name], [name], MD Taking Active   aspirin EC 81 MG tablet [id number] Yes Take 81 mg by mouth at bedtime. [provider] Taking Active Self  atorvastatin (LIPITOR) 80 MG tablet [id number] Yes TAKE 1 TABLET EVERY DAY  AT  Moshe Salisbury, MD Taking Active   BD PEN NEEDLE NANO U/F 32G X 4 MM MISC [id number] Yes INJECT TWO TIMES DAILY AS NEEDED. Marletta Lor, MD Taking Active Self  bisacodyl (DULCOLAX) 5 MG EC tablet [id number] Yes Take 5 mg by mouth daily as needed for moderate constipation. [provider] Taking Active Self  Blood Glucose Monitoring Suppl (ACCU-CHEK AVIVA PLUS) w/Device KIT [id number] Yes USE AS DIRECTED [name], [name], MD Taking Active   carvedilol (COREG) 25 MG tablet [id number] Yes TAKE 1 TABLET TWICE DAILY WITH MEALS [name], [name], MD Taking Active   cloNIDine (CATAPRES) 0.1 MG tablet [id number] Yes TAKE 1 TABLET AT BEDTIME [name], [name], MD Taking Active   COLCRYS 0.6 MG tablet [id number] Yes TAKE 1 TABLET EVERY DAY [name], [name], MD Taking Active   cyanocobalamin ((VITAMIN B-12)) injection 1,000 mcg [id number]   Eulas Post, MD  Active   dorzolamide-timolol (COSOPT) 22.3-6.8 MG/ML ophthalmic solution [id number] Yes Place 1 drop into both eyes 2 (two) times daily. [provider] Taking Active Self  Ferrous Sulfate (IRON) 325  (65 Fe) MG TABS [id number] Yes Take 1 tablet by mouth in the morning, at noon, and at bedtime. [provider] Taking Active Self  furosemide (LASIX) 80 MG tablet [id number] Yes TAKE 1 TABLET EVERY DAY Belva Crome, MD Taking Active   glucose blood (ACCU-CHEK GUIDE) test strip [id number] Yes Test once daily [name], [name], MD Taking Active   hydrALAZINE (APRESOLINE) 25 MG tablet [id number] Yes TAKE 1 TABLET THREE TIMES DAILY Belva Crome, MD Taking Active   insulin aspart (NOVOLOG) 100 UNIT/ML FlexPen [id number] Yes Inject 12 Units into the skin 3 (three) times daily with meals.  [provider] Taking Active Self  insulin glargine (LANTUS SOLOSTAR) 100 UNIT/ML Solostar Pen [id number] Yes Inject 14 Units into the skin daily. [provider] Taking Active   isosorbide mononitrate (IMDUR) 60 MG 24 hr tablet [id number] Yes TAKE 2 TABLETS (120 MG TOTAL) BY MOUTH DAILY. Belva Crome, MD Taking Active   Lancet Devices [facility]) lancets [id number] Yes USE TO TEST BLOOD GLUCOSE THREE TIMES DAILY Marletta Lor, MD Taking Active Self  latanoprost (XALATAN) 0.005 % ophthalmic solution [id number] Yes Place 1 drop into both eyes at bedtime.  [provider] Taking Active Self  nitroGLYCERIN (NITROSTAT) 0.4 MG SL tablet [id number] Yes Place 1 tablet (0.4 mg total) under the tongue every 5 (five) minutes as needed for chest pain (3 doses MAX). Belva Crome, MD Taking Active Self  omeprazole (PRILOSEC) 20 MG capsule [id number] Yes Take 1 capsule (20 mg total) by mouth daily. Loralie Champagne, PA-C Taking Active   OVER THE COUNTER MEDICATION [id number] Yes Take 1 tablet by mouth daily. Prostate formula [provider] Taking Active Self  pentoxifylline (TRENTAL) 400 MG CR tablet [id number] Yes TAKE 1 TABLET TWICE DAILY AT 10AM AND 5PM [name], [name], MD Taking Active   potassium chloride SA (KLOR-CON) 20 MEQ tablet [id number] Yes Take 1  tablet (20 mEq total) by mouth daily. Belva Crome, MD Taking Active   predniSONE (STERAPRED UNI-PAK 21 TAB) 10 MG (21) TBPK tablet [id number] Yes Take as directed [name], [name], MD Taking Active   timolol (TIMOPTIC) 0.5 % ophthalmic solution [id number] Yes Place 1 drop into both eyes in the morning and  at bedtime. [provider] Taking Active           Patient Active Problem List   Diagnosis Date Noted  . Iron deficiency anemia due to chronic blood loss 07/09/2019  . Personal history of arterial venous malformation (AVM) 07/09/2019  . Acute on chronic respiratory failure with hypoxia ([place])   . Acute exacerbation of CHF (congestive heart failure) ([place]) 08/11/2018  . Gout due to renal impairment involving toe 02/08/2017  . Insulin dependent diabetes mellitus   . Chronic combined systolic and diastolic heart failure ([place])   . Acute blood loss anemia 05/10/2015  . CKD (chronic kidney disease) stage 5, GFR less than 15 ml/min (HCC)   . B12 deficiency 06/27/2011  . Peripheral vascular disease ([place]) 01/19/2007  . Peptic ulcer disease- 2013 01/19/2007  . Diabetes mellitus type 2, insulin dependent ([place]) 10/02/2006  . Dyslipidemia 10/02/2006  . Essential hypertension 10/02/2006  . Coronary artery disease involving 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 ([place]) 10/02/2006  . BENIGN PROSTATIC HYPERTROPHY 10/02/2006    Immunization History  Administered Date(s) Administered  . Fluad Quad(high Dose 65+) 11/14/2018  . Influenza Split 01/07/2011, 11/26/2012, 12/07/2016  . Influenza Whole 02/28/2005, 12/07/2007, 12/19/2008, 11/27/2009  . Influenza, High Dose Seasonal PF 12/28/2015, 11/10/2019  . Influenza,inj,Quad PF,6+ Mos 11/13/2013, 11/09/2014  . Influenza-Unspecified 11/08/2016, 11/16/2017, 12/02/2017, 11/08/2018  . Moderna SARS-COV2 Booster Vaccination 12/28/2019  . Moderna Sars-Covid-2 Vaccination 03/19/2019, 04/01/2019, 04/16/2019, 04/29/2019  .  Pneumococcal Conjugate-13 01/28/2013, 11/08/2014  . Pneumococcal Polysaccharide-23 02/28/2005, 02/28/2009  . Tdap 01/13/2012    Conditions to be addressed/monitored:  Hypertension, Hyperlipidemia, Diabetes, Coronary Artery Disease, GERD, Chronic Kidney Disease, Gout and anemia  Care Plan : CCM Pharmacy Care Plan  Updates made by Viona Gilmore, [place] since 05/27/2020 12:00 AM    Problem: Problem: Hypertension, Hyperlipidemia, Diabetes, Coronary Artery Disease, GERD, Chronic Kidney Disease, Gout and anemia     Long-Range Goal: Patient-Specific Goal   Start Date: 05/19/2020  Expected End Date: 05/19/2021  This Visit's Progress: On track  Priority: High  Note:   Current Barriers:  . Unable to independently monitor therapeutic efficacy  Pharmacist Clinical Goal(s):  Marland Kitchen Patient will achieve adherence to monitoring guidelines and medication adherence to achieve therapeutic efficacy through collaboration with PharmD and provider.   Interventions: . 1:1 collaboration with [name], [name], MD regarding development and update of comprehensive plan of care as evidenced by provider attestation and co-signature . Inter-disciplinary care team collaboration (see longitudinal plan of care) . Comprehensive medication review performed; medication list updated in electronic medical record  Hypertension (BP goal <140/90) -Controlled -Current treatment:  Amlodipine 57m, 1 tablet once daily   Carvedilol 2666m 1 tablet twice daily with meals  Clonidine 0.66m66m1 tablet at bedtime   Hydralazine 39m105m tablet three times daily -Medications previously tried: benazepril, felodipine, labetalol, metoprolol  -Current home readings:  123, 125, 130/65-70 (checking once or twice a week) -Current dietary habits: uses very little salt but does get takeout a couple times a week -Current exercise habits: [facility]; goes for walks -Denies  hypotensive/hypertensive symptoms -Educated on Exercise goal of 150 minutes per week; Importance of home blood pressure monitoring; -Counseled to monitor BP at home weekly, document, and provide log at future appointments -Counseled on diet and exercise extensively Recommended to continue current medication  Hyperlipidemia: (LDL goal < 70) -Controlled -Current treatment: . Atorvastatin 80mg66mablet daily -Medications previously tried: simvastatin  -  Current dietary patterns: did not discuss -Current exercise habits: goes to [facility] for an exercise class; goes for walks -Educated on Cholesterol goals;  Importance of limiting foods high in cholesterol; Exercise goal of 150 minutes per week; -Counseled on diet and exercise extensively Recommended to continue current medication Recommended repeat lipid panel  Coronary artery disease (Goal: prevent heart events) -Controlled -Current treatment   Isosorbide mononitrate 7m, 2 tablets once daily  Nitroglycerin 0.474mSL, place 1 tablet under tongue every five minutes as needed for chest pain (3 doses max)  Aspirin 8146m1 tablet once daily  Atorvastatin 15m19m tablet once daily at 6pm -Medications previously tried: none  -Recommended to continue current medication  Diabetes (A1c goal <7%) -Controlled -Current medications:  Insulin aspart (Novolog Flexpen), inject 10 units three times daily with meals   Insulin glargine (Lantus Solostar) inject 14 units once daily at 10 pm  -Medications previously tried: glipizide  -Current home glucose readings . fasting glucose: 95-105 (every morning) . post prandial glucose: does not check -Denies hypoglycemic/hyperglycemic symptoms -Current meal patterns:  . breakfast: n/a . lunch: n/a  . dinner: n/a . snacks: n/a . drinks: tries to watch what he eats but does drink some soda and sweet tea -Current exercise: [employer] class once a week and walking daily -Educated on Exercise  goal of 150 minutes per week; Benefits of routine self-monitoring of blood sugar; Carbohydrate counting and/or plate method -Counseled to check feet daily and get yearly eye exams -Recommended to continue current medication Recommended for patient to keep a log of blood sugars and alternate different times of the day especially with current insulin regimen  Heart Failure (Goal: manage symptoms and prevent exacerbations) -Controlled -Last ejection fraction: 30-35% (Date: 08/05/19) -HF type: Combined Systolic and Diastolic -NYHA Class: II (slight limitation of activity) -AHA HF Stage: C (Heart disease and symptoms present) -Current treatment:  Carvedilol 25mg73mtablet twice daily with meals  Clonidine 0.1mg, 38mablet at bedtime   Hydralazine 25mg, 26mblet three times daily  Isosorbide mononitrate 60mg 241mtablet, 2 tablet once daily   Potassium chloride 20mEq, 228mlets for first dose, then take 1 tablet once daily  Furosemide 15mg, 1 t66mt once daily  -Medications previously tried: n/a  -Current home BP/HR readings: reports all are normal -Current dietary habits: tries to limit salt intake -Current exercise habits: weekly [employer] class and walking almost daily -Educated on Benefits of medications for managing symptoms and prolonging life Importance of weighing daily; if you gain more than 3 pounds in one day or 5 pounds in one week, call cardiologist Importance of blood pressure control -Recommended to continue current medication   Barrett's esophagus/GERD/PUD (Goal: minimize symptoms) -Controlled -Current treatment  . Omeprazole 20 mg 1 capsule daily -Medications previously tried: pantoprazole  -Recommended to continue current medication  Gout (Goal: uric acid < 6 and prevent flare ups) -Controlled -Current treatment  . Colchicine 0.6mg, 1 tab6m once daily as needed  -Medications previously tried: none  -Recommended repeat uric acid level  Peripheral vascular  disease (Goal: prevent heart events) -Controlled -Current treatment  . Pentoxifylline (Trental) 400mg, 1 tab39mtwice daily (at 10 AM and 5PM) -Medications previously tried: none  -Recommended to continue current medication  Vitamin B12 deficiency (Goal: vitamin B12 > 211) -Controlled -Current treatment  . Cyanocobalamin 1000mcg inject44me every 30 days -Medications previously tried: none  -Recommended repeat vitamin B12 level  Glaucoma (Goal: lower intraocular pressure) -Controlled -Current treatment  .  Dorzolamide/ timolol mealate eye drops, 1 drop in both eyes twice a day -Medications previously tried: n/a  -Recommended to continue current medication  Iron deficiency anemia (Goal: Hgb > 11) -Controlled -Current treatment  . Ferrous sulfate 65 mg 1 tablet three times daily -Medications previously tried: none  -Recommended to continue current medication  Stage 4 CKD (Goal: minimize damage to kidneys) -Controlled -Current treatment  . No medications -Medications previously tried: n/a  - Continue to adjust medications as needed   Health Maintenance -Vaccine gaps: shingrix -Current therapy:   APAP 566m, 1 tablet every eight hours as needed for mild pain or headache   Bisacodyl (Dulcolax) 542m 1 tablet once daily as needed for moderate constipation  -Educated on Cost vs benefit of each product must be carefully weighed by individual consumer -Patient is satisfied with current therapy and denies issues -Recommended to continue current medication  Patient Goals/Self-Care Activities . Patient will:  - take medications as prescribed check glucose daily, document, and provide at future appointments check blood pressure weekly, document, and provide at future appointments target a minimum of 150 minutes of moderate intensity exercise weekly  Follow Up Plan: Telephone follow up appointment with care management team member scheduled for: 4 months       Medication  Assistance: None required.  Patient affirms current coverage meets needs.  Patient's preferred pharmacy is:  [place]: [phone]: [phone] [address]: [phone]: [phone] pill box? No - patient reports taking for a long time. Does not use pill box. Reports never forgetting dose.  Pt endorses 100% compliance  We discussed: Current pharmacy is preferred with insurance plan and patient is satisfied with pharmacy services Patient decided to: Continue current medication management strategy  Care Plan and Follow Up Patient Decision:  Patient agrees to Care Plan and Follow-up.  Plan: Telephone follow up appointment with care management team member scheduled for:  4 months  MaJeni SallesPharmD BCLowellharmacist Le[place]riet [phone]

## 2020-05-20 NOTE — Telephone Encounter (Signed)
Left message on machine.  Form is ready to pick up.  No charge.  Form placed up front.

## 2020-05-20 NOTE — Telephone Encounter (Signed)
Patient contacted and form is complete and at the front

## 2020-05-21 ENCOUNTER — Other Ambulatory Visit (INDEPENDENT_AMBULATORY_CARE_PROVIDER_SITE_OTHER): Payer: Medicare HMO

## 2020-05-21 ENCOUNTER — Other Ambulatory Visit: Payer: Self-pay

## 2020-05-21 DIAGNOSIS — D649 Anemia, unspecified: Secondary | ICD-10-CM

## 2020-05-21 DIAGNOSIS — D500 Iron deficiency anemia secondary to blood loss (chronic): Secondary | ICD-10-CM

## 2020-05-21 LAB — IBC + FERRITIN
Ferritin: 70.3 ng/mL (ref 22.0–322.0)
Iron: 67 ug/dL (ref 42–165)
Saturation Ratios: 16.7 % — ABNORMAL LOW (ref 20.0–50.0)
Transferrin: 287 mg/dL (ref 212.0–360.0)

## 2020-05-21 LAB — HEMOGLOBIN: Hemoglobin: 10.3 g/dL — ABNORMAL LOW (ref 13.0–17.0)

## 2020-05-21 NOTE — Progress Notes (Signed)
Repeat hemoglobin in 4 to 6 weeks

## 2020-05-27 ENCOUNTER — Other Ambulatory Visit: Payer: Self-pay | Admitting: Internal Medicine

## 2020-05-27 NOTE — Patient Instructions (Signed)
Visit Information  Goals Addressed   None    Patient Care Plan: CCM Pharmacy Care Plan    Problem Identified: Problem: Hypertension, Hyperlipidemia, Diabetes, Coronary Artery Disease, GERD, Chronic Kidney Disease, Gout and anemia     Long-Range Goal: Patient-Specific Goal   Start Date: 05/19/2020  Expected End Date: 05/19/2021  This Visit's Progress: On track  Priority: High  Note:   Current Barriers:  . Unable to independently monitor therapeutic efficacy  Pharmacist Clinical Goal(s):  Marland Kitchen Patient will achieve adherence to monitoring guidelines and medication adherence to achieve therapeutic efficacy through collaboration with PharmD and provider.   Interventions: . 1:1 collaboration with [name], [name], MD regarding development and update of comprehensive plan of care as evidenced by provider attestation and co-signature . Inter-disciplinary care team collaboration (see longitudinal plan of care) . Comprehensive medication review performed; medication list updated in electronic medical record  Hypertension (BP goal <140/90) -Controlled -Current treatment:  Amlodipine 10mg , 1 tablet once daily   Carvedilol 25mg , 1 tablet twice daily with meals  Clonidine 0.1mg , 1 tablet at bedtime   Hydralazine 25mg , 1 tablet three times daily -Medications previously tried: benazepril, felodipine, labetalol, metoprolol  -Current home readings:  123, 125, 130/65-70 (checking once or twice a week) -Current dietary habits: uses very little salt but does get takeout a couple times a week -Current exercise habits: goes to the [facility] on Tuesday for an exercise class; goes for walks -Denies hypotensive/hypertensive symptoms -Educated on Exercise goal of 150 minutes per week; Importance of home blood pressure monitoring; -Counseled to monitor BP at home weekly, document, and provide log at future appointments -Counseled on diet and exercise extensively Recommended to continue current  medication  Hyperlipidemia: (LDL goal < 70) -Controlled -Current treatment: . Atorvastatin 80mg  1 tablet daily -Medications previously tried: simvastatin  -Current dietary patterns: did not discuss -Current exercise habits: goes to the [facility] on Tuesday for an exercise class; goes for walks -Educated on Cholesterol goals;  Importance of limiting foods high in cholesterol; Exercise goal of 150 minutes per week; -Counseled on diet and exercise extensively Recommended to continue current medication Recommended repeat lipid panel  Coronary artery disease (Goal: prevent heart events) -Controlled -Current treatment   Isosorbide mononitrate 60mg , 2 tablets once daily  Nitroglycerin 0.4mg  SL, place 1 tablet under tongue every five minutes as needed for chest pain (3 doses max)  Aspirin 81mg , 1 tablet once daily  Atorvastatin 80mg , 1 tablet once daily at 6pm -Medications previously tried: none  -Recommended to continue current medication  Diabetes (A1c goal <7%) -Controlled -Current medications:  Insulin aspart (Novolog Flexpen), inject 10 units three times daily with meals   Insulin glargine (Lantus Solostar) inject 14 units once daily at 10 pm  -Medications previously tried: glipizide  -Current home glucose readings . fasting glucose: 95-105 (every morning) . post prandial glucose: does not check -Denies hypoglycemic/hyperglycemic symptoms -Current meal patterns:  . breakfast: n/a . lunch: n/a  . dinner: n/a . snacks: n/a . drinks: tries to watch what he eats but does drink some soda and sweet tea -Current exercise: [employer] class once a week and walking daily -Educated on Exercise goal of 150 minutes per week; Benefits of routine self-monitoring of blood sugar; Carbohydrate counting and/or plate method -Counseled to check feet daily and get yearly eye exams -Recommended to continue current medication Recommended for patient to keep a log of blood sugars and alternate  different times of the day especially with current insulin regimen  Heart Failure (  Goal: manage symptoms and prevent exacerbations) -Controlled -Last ejection fraction: 30-35% (Date: 08/05/19) -HF type: Combined Systolic and Diastolic -NYHA Class: II (slight limitation of activity) -AHA HF Stage: C (Heart disease and symptoms present) -Current treatment:  Carvedilol 25mg , 1 tablet twice daily with meals  Clonidine 0.1mg , 1 tablet at bedtime   Hydralazine 25mg , 1 tablet three times daily  Isosorbide mononitrate 60mg  24 hr tablet, 2 tablet once daily   Potassium chloride 59mEq, 2 tablets for first dose, then take 1 tablet once daily  Furosemide 80mg , 1 tablet once daily  -Medications previously tried: n/a  -Current home BP/HR readings: reports all are normal -Current dietary habits: tries to limit salt intake -Current exercise habits: weekly [employer] class and walking almost daily -Educated on Benefits of medications for managing symptoms and prolonging life Importance of weighing daily; if you gain more than 3 pounds in one day or 5 pounds in one week, call cardiologist Importance of blood pressure control -Recommended to continue current medication   Barrett's esophagus/GERD/PUD (Goal: minimize symptoms) -Controlled -Current treatment  . Omeprazole 20 mg 1 capsule daily -Medications previously tried: pantoprazole  -Recommended to continue current medication  Gout (Goal: uric acid < 6 and prevent flare ups) -Controlled -Current treatment  . Colchicine 0.6mg , 1 tablet once daily as needed  -Medications previously tried: none  -Recommended repeat uric acid level  Peripheral vascular disease (Goal: prevent heart events) -Controlled -Current treatment  . Pentoxifylline (Trental) 400mg , 1 tablet twice daily (at 10 AM and 5PM) -Medications previously tried: none  -Recommended to continue current medication  Vitamin B12 deficiency (Goal: vitamin B12 > 211) -Controlled -Current  treatment  . Cyanocobalamin 1037mcg inject once every 30 days -Medications previously tried: none  -Recommended repeat vitamin B12 level  Glaucoma (Goal: lower intraocular pressure) -Controlled -Current treatment  . Dorzolamide/ timolol mealate eye drops, 1 drop in both eyes twice a day -Medications previously tried: n/a  -Recommended to continue current medication  Iron deficiency anemia (Goal: Hgb > 11) -Controlled -Current treatment  . Ferrous sulfate 65 mg 1 tablet three times daily -Medications previously tried: none  -Recommended to continue current medication  Stage 4 CKD (Goal: minimize damage to kidneys) -Controlled -Current treatment  . No medications -Medications previously tried: n/a  - Continue to adjust medications as needed   Health Maintenance -Vaccine gaps: shingrix -Current therapy:   APAP 500mg , 1 tablet every eight hours as needed for mild pain or headache   Bisacodyl (Dulcolax) 5mg , 1 tablet once daily as needed for moderate constipation  -Educated on Cost vs benefit of each product must be carefully weighed by individual consumer -Patient is satisfied with current therapy and denies issues -Recommended to continue current medication  Patient Goals/Self-Care Activities . Patient will:  - take medications as prescribed check glucose daily, document, and provide at future appointments check blood pressure weekly, document, and provide at future appointments target a minimum of 150 minutes of moderate intensity exercise weekly  Follow Up Plan: Telephone follow up appointment with care management team member scheduled for: 4 months       The patient verbalized understanding of instructions, educational materials, and care plan provided today and agreed to receive a mailed copy of patient instructions, educational materials, and care plan.  Telephone follow up appointment with pharmacy team member scheduled for: 4 months  Viona Gilmore, [facility]

## 2020-06-02 DIAGNOSIS — N185 Chronic kidney disease, stage 5: Secondary | ICD-10-CM | POA: Diagnosis not present

## 2020-06-08 DIAGNOSIS — N2581 Secondary hyperparathyroidism of renal origin: Secondary | ICD-10-CM | POA: Diagnosis not present

## 2020-06-08 DIAGNOSIS — E611 Iron deficiency: Secondary | ICD-10-CM | POA: Diagnosis not present

## 2020-06-08 DIAGNOSIS — N185 Chronic kidney disease, stage 5: Secondary | ICD-10-CM | POA: Diagnosis not present

## 2020-06-08 DIAGNOSIS — D631 Anemia in chronic kidney disease: Secondary | ICD-10-CM | POA: Diagnosis not present

## 2020-06-08 DIAGNOSIS — I120 Hypertensive chronic kidney disease with stage 5 chronic kidney disease or end stage renal disease: Secondary | ICD-10-CM | POA: Diagnosis not present

## 2020-06-16 ENCOUNTER — Telehealth: Payer: Self-pay

## 2020-06-16 NOTE — Telephone Encounter (Signed)
-----   Message from Roetta Sessions, [place] sent at 05/21/2020  4:46 PM EDT ----- Regarding: due for labs next week Due for labs the week of 4-25

## 2020-06-16 NOTE — Telephone Encounter (Signed)
Called and LM for patient to got to the lab one day next week.

## 2020-06-17 ENCOUNTER — Other Ambulatory Visit: Payer: Self-pay

## 2020-06-17 ENCOUNTER — Other Ambulatory Visit: Payer: Self-pay | Admitting: Interventional Cardiology

## 2020-06-17 ENCOUNTER — Other Ambulatory Visit: Payer: Self-pay | Admitting: Gastroenterology

## 2020-06-17 ENCOUNTER — Ambulatory Visit (INDEPENDENT_AMBULATORY_CARE_PROVIDER_SITE_OTHER): Payer: Medicare HMO | Admitting: *Deleted

## 2020-06-17 DIAGNOSIS — E538 Deficiency of other specified B group vitamins: Secondary | ICD-10-CM

## 2020-06-17 MED ORDER — CYANOCOBALAMIN 1000 MCG/ML IJ SOLN
1000.0000 ug | Freq: Once | INTRAMUSCULAR | Status: AC
Start: 2020-06-17 — End: 2020-06-17
  Administered 2020-06-17: 1000 ug via INTRAMUSCULAR

## 2020-06-17 NOTE — Progress Notes (Signed)
Per orders of Dr. Hernandez, injection of B12 given by Jaionna Weisse. Patient tolerated injection well.  

## 2020-06-24 ENCOUNTER — Other Ambulatory Visit: Payer: Self-pay

## 2020-06-24 ENCOUNTER — Telehealth: Payer: Self-pay | Admitting: Internal Medicine

## 2020-06-24 ENCOUNTER — Other Ambulatory Visit (INDEPENDENT_AMBULATORY_CARE_PROVIDER_SITE_OTHER): Payer: Medicare HMO

## 2020-06-24 DIAGNOSIS — I5043 Acute on chronic combined systolic (congestive) and diastolic (congestive) heart failure: Secondary | ICD-10-CM

## 2020-06-24 DIAGNOSIS — D649 Anemia, unspecified: Secondary | ICD-10-CM

## 2020-06-24 DIAGNOSIS — D500 Iron deficiency anemia secondary to blood loss (chronic): Secondary | ICD-10-CM | POA: Diagnosis not present

## 2020-06-24 DIAGNOSIS — I25709 Atherosclerosis of coronary artery bypass graft(s), unspecified, with unspecified angina pectoris: Secondary | ICD-10-CM

## 2020-06-24 DIAGNOSIS — N185 Chronic kidney disease, stage 5: Secondary | ICD-10-CM

## 2020-06-24 LAB — HEMOGLOBIN: Hemoglobin: 9.3 g/dL — ABNORMAL LOW (ref 13.0–17.0)

## 2020-06-24 NOTE — Telephone Encounter (Signed)
Patient son is calling and wanted to speak to someone on getting patient home health services, please advise. CB is [phone]

## 2020-06-25 NOTE — Telephone Encounter (Signed)
Okay to place a referral for [facility]?

## 2020-06-25 NOTE — Telephone Encounter (Signed)
The patient's son Park Liter is calling back to speak with  Nurse. He wanted to know when the home health referral would be placed for the patient. He also would like to give someone information about why he needs the referral. And want to talk with the nurse about pt hearing. He states he has been waiting for a call back at [phone]. He would like a call

## 2020-06-30 NOTE — Telephone Encounter (Signed)
Spoke with son [name] and he would like are referral placed for his dad for home health.   [phone] Okay to refer?

## 2020-07-01 NOTE — Addendum Note (Signed)
Addended by: Westley Hummer B on: 07/01/2020 10:54 AM   Modules accepted: Orders

## 2020-07-01 NOTE — Telephone Encounter (Signed)
Referral placed.

## 2020-07-03 DIAGNOSIS — E1122 Type 2 diabetes mellitus with diabetic chronic kidney disease: Secondary | ICD-10-CM | POA: Diagnosis not present

## 2020-07-03 DIAGNOSIS — N185 Chronic kidney disease, stage 5: Secondary | ICD-10-CM | POA: Diagnosis not present

## 2020-07-03 DIAGNOSIS — K279 Peptic ulcer, site unspecified, unspecified as acute or chronic, without hemorrhage or perforation: Secondary | ICD-10-CM | POA: Diagnosis not present

## 2020-07-03 DIAGNOSIS — J9611 Chronic respiratory failure with hypoxia: Secondary | ICD-10-CM | POA: Diagnosis not present

## 2020-07-03 DIAGNOSIS — I5042 Chronic combined systolic (congestive) and diastolic (congestive) heart failure: Secondary | ICD-10-CM | POA: Diagnosis not present

## 2020-07-03 DIAGNOSIS — I2510 Atherosclerotic heart disease of native coronary artery without angina pectoris: Secondary | ICD-10-CM | POA: Diagnosis not present

## 2020-07-03 DIAGNOSIS — E1151 Type 2 diabetes mellitus with diabetic peripheral angiopathy without gangrene: Secondary | ICD-10-CM | POA: Diagnosis not present

## 2020-07-03 DIAGNOSIS — I255 Ischemic cardiomyopathy: Secondary | ICD-10-CM | POA: Diagnosis not present

## 2020-07-03 DIAGNOSIS — I132 Hypertensive heart and chronic kidney disease with heart failure and with stage 5 chronic kidney disease, or end stage renal disease: Secondary | ICD-10-CM | POA: Diagnosis not present

## 2020-07-03 NOTE — Telephone Encounter (Signed)
[name] is calling in stating that she needs nursing orders for 1 week 3 disease and medication management and observation and assesment.  Would like to add PT for evaluation.  May leave detail msg on secured voice mail.

## 2020-07-06 DIAGNOSIS — N185 Chronic kidney disease, stage 5: Secondary | ICD-10-CM | POA: Diagnosis not present

## 2020-07-06 DIAGNOSIS — I132 Hypertensive heart and chronic kidney disease with heart failure and with stage 5 chronic kidney disease, or end stage renal disease: Secondary | ICD-10-CM | POA: Diagnosis not present

## 2020-07-06 DIAGNOSIS — I2510 Atherosclerotic heart disease of native coronary artery without angina pectoris: Secondary | ICD-10-CM | POA: Diagnosis not present

## 2020-07-06 DIAGNOSIS — J9611 Chronic respiratory failure with hypoxia: Secondary | ICD-10-CM | POA: Diagnosis not present

## 2020-07-06 DIAGNOSIS — I5042 Chronic combined systolic (congestive) and diastolic (congestive) heart failure: Secondary | ICD-10-CM | POA: Diagnosis not present

## 2020-07-06 DIAGNOSIS — E1122 Type 2 diabetes mellitus with diabetic chronic kidney disease: Secondary | ICD-10-CM | POA: Diagnosis not present

## 2020-07-06 DIAGNOSIS — E1151 Type 2 diabetes mellitus with diabetic peripheral angiopathy without gangrene: Secondary | ICD-10-CM | POA: Diagnosis not present

## 2020-07-06 DIAGNOSIS — I255 Ischemic cardiomyopathy: Secondary | ICD-10-CM | POA: Diagnosis not present

## 2020-07-06 DIAGNOSIS — K279 Peptic ulcer, site unspecified, unspecified as acute or chronic, without hemorrhage or perforation: Secondary | ICD-10-CM | POA: Diagnosis not present

## 2020-07-07 ENCOUNTER — Telehealth: Payer: Self-pay | Admitting: Internal Medicine

## 2020-07-07 NOTE — Telephone Encounter (Signed)
Verbal orders given to [facility].    She states yesterday that the patient had a syncope episode and his glucose dropped to 35. 911 was called and glucose levels improved after being medicated.  Patient has a loss or appetite, does not eat, but continues to give himself novolog 3 times a day and lantus at bedtime.  His weight has dropped to 149.7 lb.   [name] told the patient not to take insuline without eating.  She will continue to monitor the patient's food intake and glucose levels.  Please advise

## 2020-07-07 NOTE — Telephone Encounter (Signed)
Left message on machine for patient to schedule an appointment.  See phone note.

## 2020-07-07 NOTE — Telephone Encounter (Signed)
[name] called to speak with Dr. Jerilee Hoh nurse.  He didn't go into detail.  Please advise

## 2020-07-07 NOTE — Telephone Encounter (Signed)
Left message on machine for patient to schedule an appointment and to bring in CBG measurements and all meds.

## 2020-07-07 NOTE — Telephone Encounter (Signed)
Needs OV. Ask him to bring in CBG measurements and all meds.

## 2020-07-08 ENCOUNTER — Other Ambulatory Visit: Payer: Self-pay

## 2020-07-08 NOTE — Telephone Encounter (Signed)
Patient son is returning the call, please advise. CB [phone]

## 2020-07-09 ENCOUNTER — Ambulatory Visit (INDEPENDENT_AMBULATORY_CARE_PROVIDER_SITE_OTHER): Payer: Medicare HMO | Admitting: Internal Medicine

## 2020-07-09 VITALS — BP 108/50 | HR 65 | Temp 98.1°F | Ht 70.0 in | Wt 152.4 lb

## 2020-07-09 DIAGNOSIS — E162 Hypoglycemia, unspecified: Secondary | ICD-10-CM

## 2020-07-09 DIAGNOSIS — Z794 Long term (current) use of insulin: Secondary | ICD-10-CM | POA: Diagnosis not present

## 2020-07-09 DIAGNOSIS — I2510 Atherosclerotic heart disease of native coronary artery without angina pectoris: Secondary | ICD-10-CM | POA: Diagnosis not present

## 2020-07-09 DIAGNOSIS — E1151 Type 2 diabetes mellitus with diabetic peripheral angiopathy without gangrene: Secondary | ICD-10-CM | POA: Diagnosis not present

## 2020-07-09 DIAGNOSIS — N184 Chronic kidney disease, stage 4 (severe): Secondary | ICD-10-CM

## 2020-07-09 DIAGNOSIS — E538 Deficiency of other specified B group vitamins: Secondary | ICD-10-CM

## 2020-07-09 DIAGNOSIS — I132 Hypertensive heart and chronic kidney disease with heart failure and with stage 5 chronic kidney disease, or end stage renal disease: Secondary | ICD-10-CM | POA: Diagnosis not present

## 2020-07-09 DIAGNOSIS — I255 Ischemic cardiomyopathy: Secondary | ICD-10-CM | POA: Diagnosis not present

## 2020-07-09 DIAGNOSIS — E1122 Type 2 diabetes mellitus with diabetic chronic kidney disease: Secondary | ICD-10-CM

## 2020-07-09 DIAGNOSIS — N185 Chronic kidney disease, stage 5: Secondary | ICD-10-CM | POA: Diagnosis not present

## 2020-07-09 DIAGNOSIS — I5042 Chronic combined systolic (congestive) and diastolic (congestive) heart failure: Secondary | ICD-10-CM | POA: Diagnosis not present

## 2020-07-09 DIAGNOSIS — J9611 Chronic respiratory failure with hypoxia: Secondary | ICD-10-CM | POA: Diagnosis not present

## 2020-07-09 DIAGNOSIS — H9193 Unspecified hearing loss, bilateral: Secondary | ICD-10-CM

## 2020-07-09 DIAGNOSIS — K279 Peptic ulcer, site unspecified, unspecified as acute or chronic, without hemorrhage or perforation: Secondary | ICD-10-CM | POA: Diagnosis not present

## 2020-07-09 LAB — POCT GLUCOSE (DEVICE FOR HOME USE): Glucose Fasting, POC: 146 mg/dL — AB (ref 70–99)

## 2020-07-09 LAB — POCT GLYCOSYLATED HEMOGLOBIN (HGB A1C): Hemoglobin A1C: 5.5 % (ref 4.0–5.6)

## 2020-07-09 MED ORDER — FREESTYLE LIBRE 14 DAY SENSOR MISC: 1.0000 | Freq: Every day | 3 refills | Status: DC

## 2020-07-09 MED ORDER — FREESTYLE LIBRE 14 DAY READER DEVI: 1.0000 | Freq: Every day | 2 refills | Status: DC

## 2020-07-09 MED ORDER — CYANOCOBALAMIN 1000 MCG/ML IJ SOLN
1000.0000 ug | Freq: Once | INTRAMUSCULAR | Status: AC
  Administered 2020-07-09: 1000 ug via INTRAMUSCULAR

## 2020-07-09 NOTE — Patient Instructions (Signed)
-  Nice seeing you today!!  -Keep lantus 14 units at bedtime.  -Decrease novolog to 5 units with each meal ONLY if eating.  -Will send a prescription for a continuous blood glucose monitor (sample sent home with you today). Read instructions and have home health nurse help you apply. That way we can figure out what times of day you are having low blood sugars.

## 2020-07-09 NOTE — Telephone Encounter (Signed)
Spoke with son and referral send to Endo as requested

## 2020-07-09 NOTE — Progress Notes (Signed)
Established Patient Office Visit     This visit occurred during the SARS-CoV-2 public health emergency.  Safety protocols were in place, including screening questions prior to the visit, additional usage of staff PPE, and extensive cleaning of exam room while observing appropriate contact time as indicated for disinfecting solutions.    CC/Reason for Visit: Syncopal episode  HPI: [name] is a 87 y.o. male who is coming in today for the above mentioned reasons. Past Medical History is significant for: Chronic combined heart failure with a known ejection fraction of 30 to 35% and grade 2 diastolic dysfunction, hypertensionthat has been well controlled in the past, coronary artery disease, diabetes which is insulin-dependent,, chronic kidney disease stage V, B12 deficiency.    I last saw him in February at which time his A1c was 5.9.  He is on Lantus 14 units at bedtime and NovoLog which he has been using 12 units with each meal.  Unfortunately he suffered a syncopal event last week.  Home health nurse was present and CBG was noted to be 35 at the time.  Apparently he has not been eating well.  And in fact has lost 8 pounds since his last in office visit.  In addition he is requesting referral to audiology due to bilateral hearing loss.  Son is requesting referral to endocrinology.   Past Medical/Surgical History: Past Medical History:  Diagnosis Date  . Arthritis    "touch in my fingers" (09/10/2014)  . Atrophic gastritis   . AVM (arteriovenous malformation) of duodenum, acquired   . B12 deficiency anemia    "stopped taking the shots in ~ 06/2014" (09/10/2014)  . Barrett's esophagus   . CAD (coronary artery disease)    a. H/o MI in 1998;  b. s/p CABG 2003. c. 10/2014 NSTEMI/Cath: LM 75, LAD 133md, D1 95, D2 50, LCX 100ost/p, OM1 80, OM2 80, RCA 100p/m, RPDA fills via L->L collats, LIMA->LAD ok, VG->dRCA 100, VG->OM1->OM2 99 prox to OM1 insertion->recanalated ->Tx with heparin,   VG->D1 100-->Med Rx.  . Cataract   . Chronic systolic CHF (congestive heart failure) (HCC)    a. EF 40-45% in 10/2014 (previously normal in 08/2014).  . CKD (chronic kidney disease), stage IV ([place]   . Diabetes 1.5, managed as type 1 ([place]   . Hiatal hernia 2018   endoscopy -Dr. AHavery Moros . History of stomach ulcers 2013  . Hyperlipidemia   . Hypertension   . Hypertensive heart disease   . Ischemic cardiomyopathy    a. 10/2014 Echo: EF 40-45%.  . Myocardial infarction ([place]   . PVD (peripheral vascular disease) ([facility]   . Type II diabetes mellitus ([place]     Past Surgical History:  Procedure Laterality Date  . CARDIAC CATHETERIZATION  2003;   . CARDIAC CATHETERIZATION N/A 11/07/2014   Left Heart Cath; HBelva Crome MD;   . CATARACT EXTRACTION, BILATERAL Bilateral   . CHOLECYSTECTOMY N/A 05/20/2015   Procedure: LAPAROSCOPIC CHOLECYSTECTOMY;  Surgeon: DCoralie Keens MD;  [place]  Service: General;  Laterality: N/A;  . COLONOSCOPY    . COLONOSCOPY N/A 05/12/2015   Procedure: COLONOSCOPY;  Surgeon: DMilus Banister MD;  [place] [facility]  Service: Endoscopy;  Laterality: N/A;  . [place]  /[name]5/15/2012  . CORONARY ARTERY BYPASS GRAFT  2003   CABG X5  . ESOPHAGOGASTRODUODENOSCOPY  06/10/2011   Procedure: ESOPHAGOGASTRODUODENOSCOPY (EGD);  Surgeon: MLadene Artist MD,FACG;  [facility];  Service:  Endoscopy;  Laterality: N/A;  . ESOPHAGOGASTRODUODENOSCOPY N/A 10/04/2016   Procedure: ESOPHAGOGASTRODUODENOSCOPY (EGD);  Surgeon: Manus Gunning, MD;  [facility];  Service: Gastroenterology;  Laterality: N/A;    Social History:  reports that he has quit smoking. His smoking use included cigarettes. He has a 10.00 pack-year smoking history. He has never used smokeless tobacco. He reports that he does not drink alcohol and does not use drugs.  Allergies: No Known Allergies  Family History:  Family History  Problem  Relation Age of Onset  . Stroke Mother   . Hypertension Mother   . Cancer Mother   . Diabetes Other        brothers and sisters  . Coronary artery disease Other   . Hypertension Sister   . Heart attack Sister   . Colon cancer Neg Hx      Current Outpatient Medications:  .  Accu-Chek Softclix Lancets lancets, CHECK BLOOD GLUCOSE once daily, Disp: 100 each, Rfl: 12 .  acetaminophen (TYLENOL) 500 MG tablet, Take 500 mg by mouth every 8 (eight) hours as needed for mild pain or headache., Disp: , Rfl:  .  Alcohol Swabs (ALCOHOL PREP) PADS, USE TO TEST BLOOD GLUCOSE THREE TIMES DAILY, Disp: 300 each, Rfl: 3 .  amLODipine (NORVASC) 10 MG tablet, TAKE 1 TABLET EVERY DAY, Disp: 90 tablet, Rfl: 1 .  aspirin EC 81 MG tablet, Take 81 mg by mouth at bedtime., Disp: , Rfl:  .  atorvastatin (LIPITOR) 80 MG tablet, TAKE 1 TABLET EVERY DAY  AT  6PM, Disp: 90 tablet, Rfl: 3 .  BD PEN NEEDLE NANO U/F 32G X 4 MM MISC, INJECT TWO TIMES DAILY AS NEEDED., Disp: 180 each, Rfl: 3 .  bisacodyl (DULCOLAX) 5 MG EC tablet, Take 5 mg by mouth daily as needed for moderate constipation., Disp: , Rfl:  .  Blood Glucose Monitoring Suppl (ACCU-CHEK AVIVA PLUS) w/Device KIT, USE AS DIRECTED, Disp: 1 kit, Rfl: 0 .  carvedilol (COREG) 25 MG tablet, TAKE 1 TABLET TWICE DAILY WITH MEALS, Disp: 180 tablet, Rfl: 1 .  cloNIDine (CATAPRES) 0.1 MG tablet, TAKE 1 TABLET AT BEDTIME, Disp: 90 tablet, Rfl: 1 .  dorzolamide-timolol (COSOPT) 22.3-6.8 MG/ML ophthalmic solution, Place 1 drop into both eyes 2 (two) times daily., Disp: , Rfl:  .  Ferrous Sulfate (IRON) 325 (65 Fe) MG TABS, Take 1 tablet by mouth in the morning, at noon, and at bedtime., Disp: , Rfl:  .  furosemide (LASIX) 80 MG tablet, TAKE 1 TABLET EVERY DAY, Disp: 90 tablet, Rfl: 3 .  glucose blood (ACCU-CHEK GUIDE) test strip, Test once daily, Disp: 100 each, Rfl: 12 .  hydrALAZINE (APRESOLINE) 25 MG tablet, TAKE 1 TABLET THREE TIMES DAILY, Disp: 270 tablet, Rfl: 3 .   insulin aspart (NOVOLOG) 100 UNIT/ML FlexPen, Inject 5 Units into the skin 3 (three) times daily with meals., Disp: , Rfl:  .  insulin glargine (LANTUS SOLOSTAR) 100 UNIT/ML Solostar Pen, Inject 14 Units into the skin daily., Disp: , Rfl:  .  isosorbide mononitrate (IMDUR) 60 MG 24 hr tablet, TAKE 2 TABLETS (120 MG TOTAL) BY MOUTH DAILY., Disp: 180 tablet, Rfl: 3 .  Lancet Devices (ACCU-CHEK SOFTCLIX) lancets, USE TO TEST BLOOD GLUCOSE THREE TIMES DAILY, Disp: 1 each, Rfl: 0 .  latanoprost (XALATAN) 0.005 % ophthalmic solution, Place 1 drop into both eyes at bedtime. , Disp: , Rfl:  .  nitroGLYCERIN (NITROSTAT) 0.4 MG SL tablet, Place 1 tablet (0.4 mg total) under the  tongue every 5 (five) minutes as needed for chest pain (3 doses MAX)., Disp: 75 tablet, Rfl: 1 .  omeprazole (PRILOSEC) 20 MG capsule, TAKE 1 CAPSULE EVERY DAY, Disp: 90 capsule, Rfl: 4 .  pentoxifylline (TRENTAL) 400 MG CR tablet, TAKE 1 TABLET TWICE DAILY AT 10AM AND 5PM, Disp: 180 tablet, Rfl: 1 .  potassium chloride SA (KLOR-CON) 20 MEQ tablet, Take 1 tablet (20 mEq total) by mouth daily., Disp: 90 tablet, Rfl: 3 .  timolol (TIMOPTIC) 0.5 % ophthalmic solution, Place 1 drop into both eyes in the morning and at bedtime., Disp: , Rfl:  .  COLCRYS 0.6 MG tablet, TAKE 1 TABLET EVERY DAY, Disp: 90 tablet, Rfl: 1 .  OVER THE COUNTER MEDICATION, Take 1 tablet by mouth daily. Prostate formula (Patient not taking: Reported on 07/09/2020), Disp: , Rfl:   Review of Systems:  Constitutional: Denies fever, chills, diaphoresis, appetite change and fatigue.  HEENT: Denies photophobia, eye pain, redness, hearing loss, ear pain, congestion, sore throat, rhinorrhea, sneezing, mouth sores, trouble swallowing, neck pain, neck stiffness and tinnitus.   Respiratory: Denies SOB, DOE, cough, chest tightness,  and wheezing.   Cardiovascular: Denies chest pain, palpitations and leg swelling.  Gastrointestinal: Denies nausea, vomiting, abdominal pain,  diarrhea, constipation, blood in stool and abdominal distention.  Genitourinary: Denies dysuria, urgency, frequency, hematuria, flank pain and difficulty urinating.  Endocrine: Denies: hot or cold intolerance, sweats, changes in hair or nails, polyuria, polydipsia. Musculoskeletal: Denies myalgias, back pain, joint swelling, arthralgias and gait problem.  Skin: Denies pallor, rash and wound.  Neurological: Denies dizziness, seizures, syncope, weakness, light-headedness, numbness and headaches.  Hematological: Denies adenopathy. Easy bruising, personal or family bleeding history  Psychiatric/Behavioral: Denies suicidal ideation, mood changes, confusion, nervousness, sleep disturbance and agitation    Physical Exam: Vitals:   07/09/20 0852  BP: (!) 108/50  Pulse: 65  Temp: 98.1 F (36.7 C)  TempSrc: Oral  SpO2: 98%  Weight: 152 lb 6.4 oz (69.1 kg)  Height: 5' 10"  (1.778 m)    Body mass index is 21.87 kg/m.   Constitutional: NAD, calm, comfortable Eyes: PERRL, lids and conjunctivae normal ENMT: Mucous membranes are moist.  Respiratory: clear to auscultation bilaterally, no wheezing, no crackles. Normal respiratory effort. No accessory muscle use.  Cardiovascular: Regular rate and rhythm, no murmurs / rubs / gallops. No extremity edema.  Neurologic: Grossly intact and nonfocal Psychiatric: Normal judgment and insight. Alert and oriented x 3. Normal mood.    Impression and Plan:  Type 2 diabetes mellitus with stage 4 chronic kidney disease, with long-term current use of insulin (HCC)  -In office A1c was 5.5 with a CBG of 146. -I suspect he has decreased insulin requirements due to decreased appetite and weight loss. -He only checks his CBGs for sporadically and when he does these are fasting in the morning.  He tells me that this morning it was 112. -I will go ahead and keep Lantus at 14 units at bedtime but decrease NovoLog to 5 units with meals only if he eats. -I have sent a  prescription and given him a sample today of freestyle libre 2, I think that continuous glucose monitoring will be beneficial for him, to help [place] isolate what times of the day he is becoming hypoglycemic. -Per family request, will send in referral to endocrinology.  Bilateral hearing loss, unspecified hearing loss type  - Plan: Ambulatory referral to Audiology  Time spent: 32 minutes reviewing chart, interviewing and examining patient and formulating plan of  care.   Patient Instructions  -Nice seeing you today!!  -Keep lantus 14 units at bedtime.  -Decrease novolog to 5 units with each meal ONLY if eating.  -Will send a prescription for a continuous blood glucose monitor (sample sent home with you today). Read instructions and have home health nurse help you apply. That way we can figure out what times of day you are having low blood sugars.       Lelon Frohlich, MD [facility]

## 2020-07-09 NOTE — Telephone Encounter (Signed)
Appointment scheduled for 07/09/20.

## 2020-07-10 ENCOUNTER — Other Ambulatory Visit: Payer: Self-pay | Admitting: Internal Medicine

## 2020-07-10 DIAGNOSIS — I5042 Chronic combined systolic (congestive) and diastolic (congestive) heart failure: Secondary | ICD-10-CM | POA: Diagnosis not present

## 2020-07-10 DIAGNOSIS — I132 Hypertensive heart and chronic kidney disease with heart failure and with stage 5 chronic kidney disease, or end stage renal disease: Secondary | ICD-10-CM | POA: Diagnosis not present

## 2020-07-10 DIAGNOSIS — E1122 Type 2 diabetes mellitus with diabetic chronic kidney disease: Secondary | ICD-10-CM | POA: Diagnosis not present

## 2020-07-10 DIAGNOSIS — E1151 Type 2 diabetes mellitus with diabetic peripheral angiopathy without gangrene: Secondary | ICD-10-CM | POA: Diagnosis not present

## 2020-07-10 DIAGNOSIS — K279 Peptic ulcer, site unspecified, unspecified as acute or chronic, without hemorrhage or perforation: Secondary | ICD-10-CM | POA: Diagnosis not present

## 2020-07-10 DIAGNOSIS — J9611 Chronic respiratory failure with hypoxia: Secondary | ICD-10-CM | POA: Diagnosis not present

## 2020-07-10 DIAGNOSIS — I255 Ischemic cardiomyopathy: Secondary | ICD-10-CM | POA: Diagnosis not present

## 2020-07-10 DIAGNOSIS — N185 Chronic kidney disease, stage 5: Secondary | ICD-10-CM | POA: Diagnosis not present

## 2020-07-10 DIAGNOSIS — I2510 Atherosclerotic heart disease of native coronary artery without angina pectoris: Secondary | ICD-10-CM | POA: Diagnosis not present

## 2020-07-13 DIAGNOSIS — I132 Hypertensive heart and chronic kidney disease with heart failure and with stage 5 chronic kidney disease, or end stage renal disease: Secondary | ICD-10-CM | POA: Diagnosis not present

## 2020-07-13 DIAGNOSIS — E1122 Type 2 diabetes mellitus with diabetic chronic kidney disease: Secondary | ICD-10-CM | POA: Diagnosis not present

## 2020-07-13 DIAGNOSIS — I255 Ischemic cardiomyopathy: Secondary | ICD-10-CM | POA: Diagnosis not present

## 2020-07-13 DIAGNOSIS — E1151 Type 2 diabetes mellitus with diabetic peripheral angiopathy without gangrene: Secondary | ICD-10-CM | POA: Diagnosis not present

## 2020-07-13 DIAGNOSIS — N185 Chronic kidney disease, stage 5: Secondary | ICD-10-CM | POA: Diagnosis not present

## 2020-07-13 DIAGNOSIS — I5042 Chronic combined systolic (congestive) and diastolic (congestive) heart failure: Secondary | ICD-10-CM | POA: Diagnosis not present

## 2020-07-13 DIAGNOSIS — I2510 Atherosclerotic heart disease of native coronary artery without angina pectoris: Secondary | ICD-10-CM | POA: Diagnosis not present

## 2020-07-13 DIAGNOSIS — K279 Peptic ulcer, site unspecified, unspecified as acute or chronic, without hemorrhage or perforation: Secondary | ICD-10-CM | POA: Diagnosis not present

## 2020-07-13 DIAGNOSIS — J9611 Chronic respiratory failure with hypoxia: Secondary | ICD-10-CM | POA: Diagnosis not present

## 2020-07-14 ENCOUNTER — Telehealth: Payer: Self-pay

## 2020-07-14 DIAGNOSIS — I2510 Atherosclerotic heart disease of native coronary artery without angina pectoris: Secondary | ICD-10-CM | POA: Diagnosis not present

## 2020-07-14 DIAGNOSIS — I5042 Chronic combined systolic (congestive) and diastolic (congestive) heart failure: Secondary | ICD-10-CM | POA: Diagnosis not present

## 2020-07-14 DIAGNOSIS — I132 Hypertensive heart and chronic kidney disease with heart failure and with stage 5 chronic kidney disease, or end stage renal disease: Secondary | ICD-10-CM | POA: Diagnosis not present

## 2020-07-14 DIAGNOSIS — N185 Chronic kidney disease, stage 5: Secondary | ICD-10-CM | POA: Diagnosis not present

## 2020-07-14 DIAGNOSIS — J9611 Chronic respiratory failure with hypoxia: Secondary | ICD-10-CM | POA: Diagnosis not present

## 2020-07-14 DIAGNOSIS — K279 Peptic ulcer, site unspecified, unspecified as acute or chronic, without hemorrhage or perforation: Secondary | ICD-10-CM | POA: Diagnosis not present

## 2020-07-14 DIAGNOSIS — E1122 Type 2 diabetes mellitus with diabetic chronic kidney disease: Secondary | ICD-10-CM | POA: Diagnosis not present

## 2020-07-14 DIAGNOSIS — E1151 Type 2 diabetes mellitus with diabetic peripheral angiopathy without gangrene: Secondary | ICD-10-CM | POA: Diagnosis not present

## 2020-07-14 DIAGNOSIS — I255 Ischemic cardiomyopathy: Secondary | ICD-10-CM | POA: Diagnosis not present

## 2020-07-14 NOTE — Telephone Encounter (Signed)
Called and spoke to patient. He understands to go to the lab one day this week for blood work. Order is in

## 2020-07-14 NOTE — Telephone Encounter (Signed)
-----   Message from Roetta Sessions, [place] sent at 07/13/2020  8:20 AM EDT ----- Regarding: FW: hgb due  ----- Message ----- From: Roetta Sessions, CMA Sent: 07/13/2020   8:00 AM EDT To: Roetta Sessions, CMA Subject: hgb due                                        Recheck hemoglobin this week. Order is in

## 2020-07-15 ENCOUNTER — Other Ambulatory Visit (INDEPENDENT_AMBULATORY_CARE_PROVIDER_SITE_OTHER): Payer: Medicare HMO

## 2020-07-15 ENCOUNTER — Telehealth: Payer: Self-pay | Admitting: Internal Medicine

## 2020-07-15 DIAGNOSIS — D500 Iron deficiency anemia secondary to blood loss (chronic): Secondary | ICD-10-CM | POA: Diagnosis not present

## 2020-07-15 LAB — HEMOGLOBIN: Hemoglobin: 9.3 g/dL — ABNORMAL LOW (ref 13.0–17.0)

## 2020-07-15 NOTE — Telephone Encounter (Signed)
The insurance won't cover Continuous Blood Gluc Receiver (FREESTYLE LIBRE 6 DAY READER) DEVI  Continuous Blood Gluc Sensor (FREESTYLE LIBRE 14 DAY SENSOR) MISC  [facility] sent a fax requesting an alternate therapy

## 2020-07-16 ENCOUNTER — Telehealth: Payer: Self-pay | Admitting: Internal Medicine

## 2020-07-16 DIAGNOSIS — I5042 Chronic combined systolic (congestive) and diastolic (congestive) heart failure: Secondary | ICD-10-CM | POA: Diagnosis not present

## 2020-07-16 DIAGNOSIS — E1151 Type 2 diabetes mellitus with diabetic peripheral angiopathy without gangrene: Secondary | ICD-10-CM | POA: Diagnosis not present

## 2020-07-16 DIAGNOSIS — I132 Hypertensive heart and chronic kidney disease with heart failure and with stage 5 chronic kidney disease, or end stage renal disease: Secondary | ICD-10-CM | POA: Diagnosis not present

## 2020-07-16 DIAGNOSIS — N185 Chronic kidney disease, stage 5: Secondary | ICD-10-CM | POA: Diagnosis not present

## 2020-07-16 DIAGNOSIS — K279 Peptic ulcer, site unspecified, unspecified as acute or chronic, without hemorrhage or perforation: Secondary | ICD-10-CM | POA: Diagnosis not present

## 2020-07-16 DIAGNOSIS — I2510 Atherosclerotic heart disease of native coronary artery without angina pectoris: Secondary | ICD-10-CM | POA: Diagnosis not present

## 2020-07-16 DIAGNOSIS — E1122 Type 2 diabetes mellitus with diabetic chronic kidney disease: Secondary | ICD-10-CM | POA: Diagnosis not present

## 2020-07-16 DIAGNOSIS — I255 Ischemic cardiomyopathy: Secondary | ICD-10-CM | POA: Diagnosis not present

## 2020-07-16 DIAGNOSIS — J9611 Chronic respiratory failure with hypoxia: Secondary | ICD-10-CM | POA: Diagnosis not present

## 2020-07-16 NOTE — Telephone Encounter (Signed)
Patient is calling and wanted to speak to someone regarding getting  personal home health services and what all would be covered if someone came out to help in patients home, please advise. CB is [phone]

## 2020-07-17 ENCOUNTER — Ambulatory Visit: Payer: Medicare HMO

## 2020-07-17 NOTE — Telephone Encounter (Signed)
Spoke with son and he will call [facility] to see if there is somewhere that the patient will qualify for home health.

## 2020-07-21 NOTE — Telephone Encounter (Signed)
Left message on machine for patient that a 3rd party will call for insurance verification for [facility]

## 2020-07-22 ENCOUNTER — Ambulatory Visit: Payer: Medicare HMO | Admitting: Internal Medicine

## 2020-07-23 DIAGNOSIS — H903 Sensorineural hearing loss, bilateral: Secondary | ICD-10-CM | POA: Diagnosis not present

## 2020-07-23 DIAGNOSIS — N181 Chronic kidney disease, stage 1: Secondary | ICD-10-CM | POA: Diagnosis not present

## 2020-07-23 DIAGNOSIS — E119 Type 2 diabetes mellitus without complications: Secondary | ICD-10-CM | POA: Diagnosis not present

## 2020-07-24 DIAGNOSIS — E1151 Type 2 diabetes mellitus with diabetic peripheral angiopathy without gangrene: Secondary | ICD-10-CM | POA: Diagnosis not present

## 2020-07-24 DIAGNOSIS — I255 Ischemic cardiomyopathy: Secondary | ICD-10-CM | POA: Diagnosis not present

## 2020-07-24 DIAGNOSIS — E1122 Type 2 diabetes mellitus with diabetic chronic kidney disease: Secondary | ICD-10-CM | POA: Diagnosis not present

## 2020-07-24 DIAGNOSIS — K279 Peptic ulcer, site unspecified, unspecified as acute or chronic, without hemorrhage or perforation: Secondary | ICD-10-CM | POA: Diagnosis not present

## 2020-07-24 DIAGNOSIS — I132 Hypertensive heart and chronic kidney disease with heart failure and with stage 5 chronic kidney disease, or end stage renal disease: Secondary | ICD-10-CM | POA: Diagnosis not present

## 2020-07-24 DIAGNOSIS — N185 Chronic kidney disease, stage 5: Secondary | ICD-10-CM | POA: Diagnosis not present

## 2020-07-24 DIAGNOSIS — J9611 Chronic respiratory failure with hypoxia: Secondary | ICD-10-CM | POA: Diagnosis not present

## 2020-07-24 DIAGNOSIS — I5042 Chronic combined systolic (congestive) and diastolic (congestive) heart failure: Secondary | ICD-10-CM | POA: Diagnosis not present

## 2020-07-24 DIAGNOSIS — I2510 Atherosclerotic heart disease of native coronary artery without angina pectoris: Secondary | ICD-10-CM | POA: Diagnosis not present

## 2020-07-30 DIAGNOSIS — K279 Peptic ulcer, site unspecified, unspecified as acute or chronic, without hemorrhage or perforation: Secondary | ICD-10-CM | POA: Diagnosis not present

## 2020-07-30 DIAGNOSIS — J9611 Chronic respiratory failure with hypoxia: Secondary | ICD-10-CM | POA: Diagnosis not present

## 2020-07-30 DIAGNOSIS — I132 Hypertensive heart and chronic kidney disease with heart failure and with stage 5 chronic kidney disease, or end stage renal disease: Secondary | ICD-10-CM | POA: Diagnosis not present

## 2020-07-30 DIAGNOSIS — I2510 Atherosclerotic heart disease of native coronary artery without angina pectoris: Secondary | ICD-10-CM | POA: Diagnosis not present

## 2020-07-30 DIAGNOSIS — N185 Chronic kidney disease, stage 5: Secondary | ICD-10-CM | POA: Diagnosis not present

## 2020-07-30 DIAGNOSIS — I255 Ischemic cardiomyopathy: Secondary | ICD-10-CM | POA: Diagnosis not present

## 2020-07-30 DIAGNOSIS — E1151 Type 2 diabetes mellitus with diabetic peripheral angiopathy without gangrene: Secondary | ICD-10-CM | POA: Diagnosis not present

## 2020-07-30 DIAGNOSIS — E1122 Type 2 diabetes mellitus with diabetic chronic kidney disease: Secondary | ICD-10-CM | POA: Diagnosis not present

## 2020-07-30 DIAGNOSIS — I5042 Chronic combined systolic (congestive) and diastolic (congestive) heart failure: Secondary | ICD-10-CM | POA: Diagnosis not present

## 2020-08-03 DIAGNOSIS — E1151 Type 2 diabetes mellitus with diabetic peripheral angiopathy without gangrene: Secondary | ICD-10-CM | POA: Diagnosis not present

## 2020-08-03 DIAGNOSIS — I132 Hypertensive heart and chronic kidney disease with heart failure and with stage 5 chronic kidney disease, or end stage renal disease: Secondary | ICD-10-CM | POA: Diagnosis not present

## 2020-08-03 DIAGNOSIS — E1122 Type 2 diabetes mellitus with diabetic chronic kidney disease: Secondary | ICD-10-CM | POA: Diagnosis not present

## 2020-08-03 DIAGNOSIS — J9611 Chronic respiratory failure with hypoxia: Secondary | ICD-10-CM | POA: Diagnosis not present

## 2020-08-03 DIAGNOSIS — I255 Ischemic cardiomyopathy: Secondary | ICD-10-CM | POA: Diagnosis not present

## 2020-08-03 DIAGNOSIS — K279 Peptic ulcer, site unspecified, unspecified as acute or chronic, without hemorrhage or perforation: Secondary | ICD-10-CM | POA: Diagnosis not present

## 2020-08-03 DIAGNOSIS — I2510 Atherosclerotic heart disease of native coronary artery without angina pectoris: Secondary | ICD-10-CM | POA: Diagnosis not present

## 2020-08-03 DIAGNOSIS — I5042 Chronic combined systolic (congestive) and diastolic (congestive) heart failure: Secondary | ICD-10-CM | POA: Diagnosis not present

## 2020-08-03 DIAGNOSIS — N185 Chronic kidney disease, stage 5: Secondary | ICD-10-CM | POA: Diagnosis not present

## 2020-08-05 ENCOUNTER — Other Ambulatory Visit: Payer: Self-pay | Admitting: Internal Medicine

## 2020-08-05 ENCOUNTER — Other Ambulatory Visit: Payer: Self-pay | Admitting: Interventional Cardiology

## 2020-08-05 DIAGNOSIS — I255 Ischemic cardiomyopathy: Secondary | ICD-10-CM | POA: Diagnosis not present

## 2020-08-05 DIAGNOSIS — K279 Peptic ulcer, site unspecified, unspecified as acute or chronic, without hemorrhage or perforation: Secondary | ICD-10-CM | POA: Diagnosis not present

## 2020-08-05 DIAGNOSIS — I132 Hypertensive heart and chronic kidney disease with heart failure and with stage 5 chronic kidney disease, or end stage renal disease: Secondary | ICD-10-CM | POA: Diagnosis not present

## 2020-08-05 DIAGNOSIS — N185 Chronic kidney disease, stage 5: Secondary | ICD-10-CM | POA: Diagnosis not present

## 2020-08-05 DIAGNOSIS — E1151 Type 2 diabetes mellitus with diabetic peripheral angiopathy without gangrene: Secondary | ICD-10-CM | POA: Diagnosis not present

## 2020-08-05 DIAGNOSIS — J9611 Chronic respiratory failure with hypoxia: Secondary | ICD-10-CM | POA: Diagnosis not present

## 2020-08-05 DIAGNOSIS — E1122 Type 2 diabetes mellitus with diabetic chronic kidney disease: Secondary | ICD-10-CM | POA: Diagnosis not present

## 2020-08-05 DIAGNOSIS — I2510 Atherosclerotic heart disease of native coronary artery without angina pectoris: Secondary | ICD-10-CM | POA: Diagnosis not present

## 2020-08-05 DIAGNOSIS — I5042 Chronic combined systolic (congestive) and diastolic (congestive) heart failure: Secondary | ICD-10-CM | POA: Diagnosis not present

## 2020-08-10 ENCOUNTER — Ambulatory Visit (INDEPENDENT_AMBULATORY_CARE_PROVIDER_SITE_OTHER): Payer: Medicare HMO

## 2020-08-10 ENCOUNTER — Other Ambulatory Visit: Payer: Self-pay

## 2020-08-10 DIAGNOSIS — E538 Deficiency of other specified B group vitamins: Secondary | ICD-10-CM | POA: Diagnosis not present

## 2020-08-10 MED ORDER — CYANOCOBALAMIN 1000 MCG/ML IJ SOLN
1000.0000 ug | Freq: Once | INTRAMUSCULAR | Status: AC
  Administered 2020-08-10: 1000 ug via INTRAMUSCULAR

## 2020-08-10 NOTE — Progress Notes (Signed)
Per orders of Dr. Sinclair Ship, injection of B12 given in Left deltoid by Franco Collet. Patient tolerated injection well.

## 2020-08-10 NOTE — Patient Instructions (Signed)
Health Maintenance Due  Topic Date Due   Zoster Vaccines- Shingrix (1 of 2) Never done   FOOT EXAM  08/03/2018    Depression screen [facility] 2/9 07/09/2020 11/21/2019 09/13/2018  Decreased Interest 0 0 0  Down, Depressed, Hopeless 0 0 0  PHQ - 2 Score 0 0 0  Altered sleeping 0 0 0  Tired, decreased energy 0 0 0  Change in appetite 3 0 0  Feeling bad or failure about yourself  1 0 0  Trouble concentrating 0 0 0  Moving slowly or fidgety/restless 0 0 0  Suicidal thoughts 0 0 0  PHQ-9 Score 4 0 0  Difficult doing work/chores Not difficult at all Not difficult at all -  Some recent data might be hidden

## 2020-08-11 DIAGNOSIS — I5042 Chronic combined systolic (congestive) and diastolic (congestive) heart failure: Secondary | ICD-10-CM | POA: Diagnosis not present

## 2020-08-11 DIAGNOSIS — N185 Chronic kidney disease, stage 5: Secondary | ICD-10-CM | POA: Diagnosis not present

## 2020-08-11 DIAGNOSIS — I132 Hypertensive heart and chronic kidney disease with heart failure and with stage 5 chronic kidney disease, or end stage renal disease: Secondary | ICD-10-CM | POA: Diagnosis not present

## 2020-08-11 DIAGNOSIS — I2510 Atherosclerotic heart disease of native coronary artery without angina pectoris: Secondary | ICD-10-CM | POA: Diagnosis not present

## 2020-08-11 DIAGNOSIS — K279 Peptic ulcer, site unspecified, unspecified as acute or chronic, without hemorrhage or perforation: Secondary | ICD-10-CM | POA: Diagnosis not present

## 2020-08-11 DIAGNOSIS — J9611 Chronic respiratory failure with hypoxia: Secondary | ICD-10-CM | POA: Diagnosis not present

## 2020-08-11 DIAGNOSIS — E1122 Type 2 diabetes mellitus with diabetic chronic kidney disease: Secondary | ICD-10-CM | POA: Diagnosis not present

## 2020-08-11 DIAGNOSIS — I255 Ischemic cardiomyopathy: Secondary | ICD-10-CM | POA: Diagnosis not present

## 2020-08-11 DIAGNOSIS — E1151 Type 2 diabetes mellitus with diabetic peripheral angiopathy without gangrene: Secondary | ICD-10-CM | POA: Diagnosis not present

## 2020-08-21 ENCOUNTER — Other Ambulatory Visit: Payer: Self-pay | Admitting: Internal Medicine

## 2020-08-21 ENCOUNTER — Other Ambulatory Visit: Payer: Self-pay | Admitting: Interventional Cardiology

## 2020-08-21 DIAGNOSIS — I255 Ischemic cardiomyopathy: Secondary | ICD-10-CM | POA: Diagnosis not present

## 2020-08-21 DIAGNOSIS — E1151 Type 2 diabetes mellitus with diabetic peripheral angiopathy without gangrene: Secondary | ICD-10-CM | POA: Diagnosis not present

## 2020-08-21 DIAGNOSIS — K279 Peptic ulcer, site unspecified, unspecified as acute or chronic, without hemorrhage or perforation: Secondary | ICD-10-CM | POA: Diagnosis not present

## 2020-08-21 DIAGNOSIS — E1122 Type 2 diabetes mellitus with diabetic chronic kidney disease: Secondary | ICD-10-CM | POA: Diagnosis not present

## 2020-08-21 DIAGNOSIS — J9611 Chronic respiratory failure with hypoxia: Secondary | ICD-10-CM | POA: Diagnosis not present

## 2020-08-21 DIAGNOSIS — I132 Hypertensive heart and chronic kidney disease with heart failure and with stage 5 chronic kidney disease, or end stage renal disease: Secondary | ICD-10-CM | POA: Diagnosis not present

## 2020-08-21 DIAGNOSIS — N185 Chronic kidney disease, stage 5: Secondary | ICD-10-CM | POA: Diagnosis not present

## 2020-08-21 DIAGNOSIS — I2510 Atherosclerotic heart disease of native coronary artery without angina pectoris: Secondary | ICD-10-CM | POA: Diagnosis not present

## 2020-08-21 DIAGNOSIS — I5042 Chronic combined systolic (congestive) and diastolic (congestive) heart failure: Secondary | ICD-10-CM | POA: Diagnosis not present

## 2020-08-21 MED ORDER — HYDRALAZINE HCL 25 MG PO TABS: 25.0000 mg | ORAL_TABLET | Freq: Three times a day (TID) | ORAL | 1 refills | Status: DC

## 2020-08-25 DIAGNOSIS — H401132 Primary open-angle glaucoma, bilateral, moderate stage: Secondary | ICD-10-CM | POA: Diagnosis not present

## 2020-08-25 DIAGNOSIS — Z961 Presence of intraocular lens: Secondary | ICD-10-CM | POA: Diagnosis not present

## 2020-08-27 DIAGNOSIS — J9611 Chronic respiratory failure with hypoxia: Secondary | ICD-10-CM | POA: Diagnosis not present

## 2020-08-27 DIAGNOSIS — K279 Peptic ulcer, site unspecified, unspecified as acute or chronic, without hemorrhage or perforation: Secondary | ICD-10-CM | POA: Diagnosis not present

## 2020-08-27 DIAGNOSIS — N185 Chronic kidney disease, stage 5: Secondary | ICD-10-CM | POA: Diagnosis not present

## 2020-08-27 DIAGNOSIS — I132 Hypertensive heart and chronic kidney disease with heart failure and with stage 5 chronic kidney disease, or end stage renal disease: Secondary | ICD-10-CM | POA: Diagnosis not present

## 2020-08-27 DIAGNOSIS — I2510 Atherosclerotic heart disease of native coronary artery without angina pectoris: Secondary | ICD-10-CM | POA: Diagnosis not present

## 2020-08-27 DIAGNOSIS — E1122 Type 2 diabetes mellitus with diabetic chronic kidney disease: Secondary | ICD-10-CM | POA: Diagnosis not present

## 2020-08-27 DIAGNOSIS — I5042 Chronic combined systolic (congestive) and diastolic (congestive) heart failure: Secondary | ICD-10-CM | POA: Diagnosis not present

## 2020-08-27 DIAGNOSIS — I255 Ischemic cardiomyopathy: Secondary | ICD-10-CM | POA: Diagnosis not present

## 2020-08-27 DIAGNOSIS — E1151 Type 2 diabetes mellitus with diabetic peripheral angiopathy without gangrene: Secondary | ICD-10-CM | POA: Diagnosis not present

## 2020-09-09 ENCOUNTER — Other Ambulatory Visit: Payer: Self-pay

## 2020-09-09 ENCOUNTER — Ambulatory Visit (INDEPENDENT_AMBULATORY_CARE_PROVIDER_SITE_OTHER): Payer: Medicare HMO | Admitting: *Deleted

## 2020-09-09 DIAGNOSIS — E538 Deficiency of other specified B group vitamins: Secondary | ICD-10-CM | POA: Diagnosis not present

## 2020-09-09 MED ORDER — CYANOCOBALAMIN 1000 MCG/ML IJ SOLN
1000.0000 ug | Freq: Once | INTRAMUSCULAR | Status: AC
  Administered 2020-09-09: 1000 ug via INTRAMUSCULAR

## 2020-09-09 NOTE — Progress Notes (Signed)
Per orders of Dr. Hernandez, injection of B12 given by Katarzyna Wolven. Patient tolerated injection well.  

## 2020-09-15 ENCOUNTER — Telehealth: Payer: Self-pay | Admitting: Pharmacist

## 2020-09-15 NOTE — Chronic Care Management (AMB) (Signed)
    Chronic Care Management Pharmacy Assistant   Name: [name]  MRN: [id number] DOB: 1931-11-06  09/15/20-  Called patient to remind of appointment with Jeni Salles) on (09/16/20 at 1pm via telephone)   No answer, left message of appointment date, time and type of appointment (either telephone or in person). Left message to have all medications, supplements, blood pressure and/or blood sugar logs available during appointment and to return call if need to reschedule.   Care Gaps:  AWV -  Overdue. Message sent to [name] CMA to schedule. Zoster vaccines [facility]) - never done. Yearly foot exam - Overdue since 08/03/18.  Star Rating Drug:  Atorvastatin 80mg  - Last filled on 08/10/20 90DS at [facility].   Any gaps in medications fill history? NoHoover Browns CMA  Clinical Pharmacist Assistant [phone]

## 2020-09-16 ENCOUNTER — Ambulatory Visit (INDEPENDENT_AMBULATORY_CARE_PROVIDER_SITE_OTHER): Payer: Medicare HMO | Admitting: Pharmacist

## 2020-09-16 DIAGNOSIS — I10 Essential (primary) hypertension: Secondary | ICD-10-CM | POA: Diagnosis not present

## 2020-09-16 DIAGNOSIS — E1122 Type 2 diabetes mellitus with diabetic chronic kidney disease: Secondary | ICD-10-CM | POA: Diagnosis not present

## 2020-09-16 DIAGNOSIS — N184 Chronic kidney disease, stage 4 (severe): Secondary | ICD-10-CM | POA: Diagnosis not present

## 2020-09-16 DIAGNOSIS — Z794 Long term (current) use of insulin: Secondary | ICD-10-CM | POA: Diagnosis not present

## 2020-09-16 NOTE — Progress Notes (Signed)
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 year old male who is a primary patient of [name], [name], MD.  The CCM team was consulted for assistance with disease management and care coordination needs.    Engaged with patient by telephone for follow up visit in response to provider referral for pharmacy case management and/or care coordination services.   Consent to Services:  The patient was given information about Chronic Care Management services, agreed to services, and gave verbal consent prior to initiation of services.  Please see initial visit note for detailed documentation.   Patient Care Team: [name], [name], MD as PCP - General (Internal Medicine) Belva Crome, MD as PCP - Cardiology (Cardiology) Viona Gilmore, [facility] as Pharmacist (Pharmacist)  Recent office visits: 09/09/20 Patient presented for vitamin B12 injection.   07/09/20 Domingo Mend, MD: Patient presented for diabetes follow up. Decreased Novolog to 5 units with meals only if he eats. Referral placed to endocrinology. Order placed for freestyle libre.  04/22/20 Domingo Mend, MD: Patient presented for diabetes follow up. No changes made.    Recent consult visits: 07/23/20 Johna Roles (audiology): Unable to access notes.  05/15/19 Luberta Mutter, MD (ophthalmology): Patient presented for follow up. Continued ophthalmic regimen.  04/09/20 Elza Rafter,  MD (ophthalmology): Patient presented for trabeculosplasty.  03/09/20 Daneen Schick, MD (cardiology): Patient presented for CHF and CAD follow up.  02/06/20 Modena Slater Mederios (eye center): Patient presented for initial evaluation for glaucoma.    01/03/20 Luberta Mutter (ophthalmology): Patient presented for glaucoma follow up. Unable to access notes.   10/23/19 Pearson Grippe (nephrology): Patient presented for follow up for CKD. Unable to access notes.  Hospital visits: None in previous 6 months  Objective:  Lab Results  Component Value Date   CREATININE 3.65 (H) 08/05/2019   BUN 47 (H) 08/05/2019   GFR 23.08 (L) 08/02/2017   GFRNONAA 14 (L) 08/05/2019   GFRAA 16 (L) 08/05/2019   NA 143 08/05/2019   K 3.9 08/05/2019   CALCIUM 9.2 08/05/2019   CO2 22 08/05/2019   GLUCOSE 144 (H) 08/05/2019    Lab Results  Component Value Date/Time   HGBA1C 5.5 07/09/2020 08:56 AM   HGBA1C 5.9 (A) 04/22/2020 11:27 AM   HGBA1C 7.5 (H) 08/11/2018 03:19 PM   HGBA1C 7.0 (H) 08/02/2017 01:52 PM   GFR 23.08 (L) 08/02/2017 01:52 PM   GFR 21.53 (L) 11/17/2016 11:29 AM   MICROALBUR 30.8 (H) 08/02/2017 01:52 PM   MICROALBUR 94.9 (H) 03/28/2014 10:21 AM    Last diabetic Eye exam:  Lab Results  Component Value Date/Time   HMDIABEYEEXA No Retinopathy 10/30/2019 12:00 AM    Last diabetic Foot exam: No results found for: HMDIABFOOTEX   Lab Results  Component Value Date   CHOL 120 11/21/2018   HDL 23 (L) 11/21/2018   LDLCALC 61 11/21/2018   LDLDIRECT 93.3 01/22/2013   TRIG 215 (H) 11/21/2018   CHOLHDL 5.2 (  H) 11/21/2018    Hepatic Function Latest Ref Rng & Units 07/16/2019 11/21/2018 08/11/2018  Total Protein 6.0 - 8.5 g/dL 7.2 7.1 8.0  Albumin 3.6 - 4.6 g/dL 4.2 4.5 4.4  AST 0 - 40 IU/L '16 15 19  ' ALT 0 - 44 IU/L '14 14 20  ' Alk Phosphatase 48 - 121 IU/L 105 90 74  Total Bilirubin 0.0 - 1.2 mg/dL 0.4 0.3 1.0  Bilirubin, Direct 0.00 - 0.40 mg/dL - 0.11 -    Lab Results  Component Value Date/Time    TSH 4.84 (H) 08/02/2017 01:52 PM   TSH 2.846 12/29/2014 07:42 PM   TSH 3.608 09/10/2014 08:33 PM   TSH 4.99 (H) 03/28/2014 10:21 AM   FREET4 0.61 09/10/2014 08:33 PM    CBC Latest Ref Rng & Units 07/15/2020 06/24/2020 05/21/2020  WBC 4.0 - 10.5 K/uL - - -  Hemoglobin 13.0 - 17.0 g/dL 9.3(L) 9.3(L) 10.3(L)  Hematocrit 39.0 - 52.0 % - - -  Platelets 150.0 - 400.0 K/uL - - -    No results found for: VD25OH  Clinical ASCVD: Yes  The ASCVD Risk score Mikey Bussing DC Jr., et al., 2013) failed to calculate for the following reasons:   The 2013 ASCVD risk score is only valid for ages 16 to 53    Depression screen PHQ 2/9 07/09/2020 11/21/2019 09/13/2018  Decreased Interest 0 0 0  Down, Depressed, Hopeless 0 0 0  PHQ - 2 Score 0 0 0  Altered sleeping 0 0 0  Tired, decreased energy 0 0 0  Change in appetite 3 0 0  Feeling bad or failure about yourself  1 0 0  Trouble concentrating 0 0 0  Moving slowly or fidgety/restless 0 0 0  Suicidal thoughts 0 0 0  PHQ-9 Score 4 0 0  Difficult doing work/chores Not difficult at all Not difficult at all -  Some recent data might be hidden      Social History   Tobacco Use  Smoking Status Former   Packs/day: 0.50   Years: 20.00   Pack years: 10.00   Types: Cigarettes  Smokeless Tobacco Never  Tobacco Comments   "quit smoking cigarettes in the 1960s    BP Readings from Last 3 Encounters:  07/09/20 (!) 108/50  04/22/20 130/80  03/09/20 124/60   Pulse Readings from Last 3 Encounters:  07/09/20 65  04/22/20 61  03/09/20 61   Wt Readings from Last 3 Encounters:  07/09/20 152 lb 6.4 oz (69.1 kg)  04/22/20 160 lb 6.4 oz (72.8 kg)  03/09/20 158 lb (71.7 kg)   BMI Readings from Last 3 Encounters:  07/09/20 21.87 kg/m  04/22/20 23.02 kg/m  03/09/20 22.67 kg/m    Assessment/Interventions: Review of patient past medical history, allergies, medications, health status, including review of consultants reports, laboratory and other test data,  was performed as part of comprehensive evaluation and provision of chronic care management services.   SDOH:  (Social Determinants of Health) assessments and interventions performed: No   CCM Care Plan  No Known Allergies  Medications Reviewed Today     Reviewed by Lamarr Lulas, CMA (Certified Medical Assistant) on 07/09/20 at 0857  Med List Status: <None>   Medication Order Taking? Sig Documenting Provider Last Dose Status Informant  Accu-Chek Softclix Lancets lancets [id number] Yes CHECK BLOOD GLUCOSE once daily [name], [name], MD Taking Active   acetaminophen (TYLENOL) 500 MG tablet [id number] Yes Take 500 mg by mouth every 8 (eight) hours as  needed for mild pain or headache. [provider] Taking Active Self  Alcohol Swabs (ALCOHOL PREP) PADS [id number] Yes USE TO TEST BLOOD GLUCOSE THREE TIMES DAILY Marletta Lor, MD Taking Active Self  amLODipine (NORVASC) 10 MG tablet [id number] Yes TAKE 1 TABLET EVERY DAY [name], [name], MD Taking Active   aspirin EC 81 MG tablet [id number] Yes Take 81 mg by mouth at bedtime. [provider] Taking Active Self  atorvastatin (LIPITOR) 80 MG tablet [id number] Yes TAKE 1 TABLET EVERY DAY  AT  Moshe Salisbury, MD Taking Active   BD PEN NEEDLE NANO U/F 32G X 4 MM MISC [id number] Yes INJECT TWO TIMES DAILY AS NEEDED. Marletta Lor, MD Taking Active Self  bisacodyl (DULCOLAX) 5 MG EC tablet [id number] Yes Take 5 mg by mouth daily as needed for moderate constipation. [provider] Taking Active Self  Blood Glucose Monitoring Suppl (ACCU-CHEK AVIVA PLUS) w/Device KIT [id number] Yes USE AS DIRECTED [name], [name], MD Taking Active   carvedilol (COREG) 25 MG tablet [id number] Yes TAKE 1 TABLET TWICE DAILY WITH MEALS [name], [name], MD Taking Active   cloNIDine (CATAPRES) 0.1 MG tablet [id number] Yes TAKE 1 TABLET AT BEDTIME [name], [name], MD Taking Active    COLCRYS 0.6 MG tablet [id number] No TAKE 1 TABLET EVERY DAY [name], [name], MD Unknown Active   dorzolamide-timolol (COSOPT) 22.3-6.8 MG/ML ophthalmic solution [id number] Yes Place 1 drop into both eyes 2 (two) times daily. [provider] Taking Active Self  Ferrous Sulfate (IRON) 325 (65 Fe) MG TABS [id number] Yes Take 1 tablet by mouth in the morning, at noon, and at bedtime. [provider] Taking Active Self  furosemide (LASIX) 80 MG tablet [id number] Yes TAKE 1 TABLET EVERY DAY Belva Crome, MD Taking Active   glucose blood (ACCU-CHEK GUIDE) test strip [id number] Yes Test once daily [name], [name], MD Taking Active   hydrALAZINE (APRESOLINE) 25 MG tablet [id number] Yes TAKE 1 TABLET THREE TIMES DAILY Belva Crome, MD Taking Active   insulin aspart (NOVOLOG) 100 UNIT/ML FlexPen [id number] Yes Inject 12 Units into the skin 3 (three) times daily with meals.  [provider] Taking Active Self  insulin glargine (LANTUS SOLOSTAR) 100 UNIT/ML Solostar Pen [id number] Yes Inject 14 Units into the skin daily. [provider] Taking Active   isosorbide mononitrate (IMDUR) 60 MG 24 hr tablet [id number] Yes TAKE 2 TABLETS (120 MG TOTAL) BY MOUTH DAILY. Belva Crome, MD Taking Active   Lancet Devices [facility]) lancets [id number] Yes USE TO TEST BLOOD GLUCOSE THREE TIMES DAILY Marletta Lor, MD Taking Active Self  latanoprost (XALATAN) 0.005 % ophthalmic solution [id number] Yes Place 1 drop into both eyes at bedtime.  [provider] Taking Active Self  nitroGLYCERIN (NITROSTAT) 0.4 MG SL tablet [id number] Yes Place 1 tablet (0.4 mg total) under the tongue every 5 (five) minutes as needed for chest pain (3 doses MAX). Belva Crome, MD Taking Active Self  omeprazole (PRILOSEC) 20 MG capsule [id number] Yes TAKE 1 CAPSULE EVERY DAY Zehr, Laban Emperor, PA-C Taking Active   OVER THE COUNTER MEDICATION [id number] No Take 1 tablet by mouth  daily. Prostate formula  Patient not taking: Reported on 07/09/2020   [provider] Not Taking Active Self  pentoxifylline (TRENTAL) 400 MG CR tablet [id number] Yes TAKE 1 TABLET TWICE DAILY AT 10AM AND 5PM [name], [name], MD Taking Active   potassium  chloride SA (KLOR-CON) 20 MEQ tablet [id number] Yes Take 1 tablet (20 mEq total) by mouth daily. Belva Crome, MD Taking Active   timolol (TIMOPTIC) 0.5 % ophthalmic solution [id number] Yes Place 1 drop into both eyes in the morning and at bedtime. [provider] Taking Active             Patient Active Problem List   Diagnosis Date Noted   Iron deficiency anemia due to chronic blood loss 07/09/2019   Personal history of arterial venous malformation (AVM) 07/09/2019   Acute on chronic respiratory failure with hypoxia (HCC)    Acute exacerbation of CHF (congestive heart failure) ([place]) 08/11/2018   Gout due to renal impairment involving toe 02/08/2017   Insulin dependent diabetes mellitus    Chronic combined systolic and diastolic heart failure ([place])    Acute blood loss anemia 05/10/2015   CKD (chronic kidney disease) stage 5, GFR less than 15 ml/min (HCC)    B12 deficiency 06/27/2011   Peripheral vascular disease ([place]) 01/19/2007   Peptic ulcer disease- 2013 01/19/2007   Diabetes mellitus type 2, insulin dependent ([place]) 10/02/2006   Dyslipidemia 10/02/2006   Essential hypertension 10/02/2006   Coronary artery disease involving 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 ([place]) 10/02/2006   BENIGN PROSTATIC HYPERTROPHY 10/02/2006    Immunization History  Administered Date(s) Administered   Fluad Quad(high Dose 65+) 11/14/2018   Influenza Split 01/07/2011, 11/26/2012, 12/07/2016   Influenza Whole 02/28/2005, 12/07/2007, 12/19/2008, 11/27/2009   Influenza, High Dose Seasonal PF 12/28/2015, 11/10/2019   Influenza,inj,Quad PF,6+ Mos 11/13/2013, 11/09/2014   Influenza-Unspecified 11/08/2016,  11/16/2017, 12/02/2017, 11/08/2018   Moderna SARS-COV2 Booster Vaccination 12/28/2019   Moderna Sars-Covid-2 Vaccination 03/19/2019, 04/01/2019, 04/16/2019, 04/29/2019   Pneumococcal Conjugate-13 01/28/2013, 11/08/2014   Pneumococcal Polysaccharide-23 02/28/2005, 02/28/2009   Tdap 01/13/2012    Conditions to be addressed/monitored:  Hypertension, Hyperlipidemia, Diabetes, Coronary Artery Disease, GERD, Chronic Kidney Disease, Gout and anemia  Care Plan : CCM Pharmacy Care Plan  Updates made by Viona Gilmore, [place] since 09/28/2020 12:00 AM     Problem: Problem: Hypertension, Hyperlipidemia, Diabetes, Coronary Artery Disease, GERD, Chronic Kidney Disease, Gout and anemia      Long-Range Goal: Patient-Specific Goal   Start Date: 05/19/2020  Expected End Date: 05/19/2021  Recent Progress: On track  Priority: High  Note:   Current Barriers:  Unable to independently monitor therapeutic efficacy  Pharmacist Clinical Goal(s):  Patient will achieve adherence to monitoring guidelines and medication adherence to achieve therapeutic efficacy through collaboration with PharmD and provider.   Interventions: 1:1 collaboration with [name], [name], MD regarding development and update of comprehensive plan of care as evidenced by provider attestation and co-signature Inter-disciplinary care team collaboration (see longitudinal plan of care) Comprehensive medication review performed; medication list updated in electronic medical record  Hypertension (BP goal <140/90) -Controlled -Current treatment: Amlodipine 60m, 1 tablet once daily  Carvedilol 257m 1 tablet twice daily with meals Clonidine 0.80m45m1 tablet at bedtime  Hydralazine 65m16m tablet three times daily -Medications previously tried: benazepril, felodipine, labetalol, metoprolol  -Current home readings:  123, 125, 130/65-70 (checking once or twice a week) -Current dietary habits: uses very little salt but does get  takeout a couple times a week -[facility] habits[facility]; goes for walks -Denies hypotensive/hypertensive symptoms -Educated on Exercise goal of 150 minutes per week; Importance of home blood pressure monitoring; -Counseled to monitor BP at home weekly,  document, and provide log at future appointments -Counseled on diet and exercise extensively Recommended to continue current medication  Hyperlipidemia: (LDL goal < 70) -Controlled -Current treatment: Atorvastatin 58m 1 tablet daily -Medications previously tried: simvastatin  -Current dietary patterns: did not discuss -[facility] habits[facility]; goes for walks -Educated on Cholesterol goals;  Importance of limiting foods high in cholesterol; Exercise goal of 150 minutes per week; -Counseled on diet and exercise extensively Recommended to continue current medication Recommended repeat lipid panel  Coronary artery disease (Goal: prevent heart events) -Controlled -Current treatment  Isosorbide mononitrate 666m 2 tablets once daily Nitroglycerin 0.68m20mL, place 1 tablet under tongue every five minutes as needed for chest pain (3 doses max) Aspirin 2m48m tablet once daily Atorvastatin 80mg80mtablet once daily at 6pm -Medications previously tried: none  -Recommended to continue current medication  Diabetes (A1c goal <7%) -Controlled -Current medications: Insulin aspart (Novolog Flexpen), inject 8 units three times daily with meals - supposed to be injecting 5 units  Insulin glargine (Lantus Solostar) inject 1- units once daily at 10 pm  -Medications previously tried: glipizide  -Current home glucose readings fasting glucose: 110, 87 post prandial glucose: does not check -Denies hypoglycemic/hyperglycemic symptoms -Current meal patterns:  breakfast: n/a lunch: n/a  dinner: n/a snacks: n/a drinks: tries to watch what he eats  but does drink some soda and sweet tea -Current exercise: [employer] class once a week and walking daily -Educated on Exercise goal of 150 minutes per week; Benefits of routine self-monitoring of blood sugar; Continuous glucose monitoring; Carbohydrate counting and/or plate method -Counseled to check feet daily and get yearly eye exams -Recommended to continue current medication Recommended decreasing Novolog to 5 units three times a day and not 8 units as he had been doing.  Heart Failure (Goal: manage symptoms and prevent exacerbations) -Controlled -Last ejection fraction: 30-35% (Date: 08/05/19) -HF type: Combined Systolic and Diastolic -NYHA Class: II (slight limitation of activity) -AHA HF Stage: C (Heart disease and symptoms present) -Current treatment: Carvedilol 25mg,75mablet twice daily with meals Clonidine 0.1mg, 147mblet at bedtime  Hydralazine 25mg, 1368mlet three times daily Isosorbide mononitrate 60mg 24 62mablet, 2 tablet once daily  Potassium chloride 20mEq, 2 58mets for first dose, then take 1 tablet once daily Furosemide 80mg, 1 ta16m once daily  -Medications previously tried: n/a  -Current home BP/HR readings: reports all are normal -Current dietary habits: tries to limit salt intake -Current exercise habits: weekly [employer] class and walking almost daily -Educated on Benefits of medications for managing symptoms and prolonging life Importance of weighing daily; if you gain more than 3 pounds in one day or 5 pounds in one week, call cardiologist Importance of blood pressure control -Recommended to continue current medication   Barrett's esophagus/GERD/PUD (Goal: minimize symptoms) -Controlled -Current treatment  Omeprazole 20 mg 1 capsule daily -Medications previously tried: pantoprazole  -Recommended to continue current medication  Gout (Goal: uric acid < 6 and prevent flare ups) -Controlled -Current treatment  Colchicine 0.6mg, 1 tabl77monce daily as needed   -Medications previously tried: none  -Recommended repeat uric acid level  Peripheral vascular disease (Goal: prevent heart events) -Controlled -Current treatment  Pentoxifylline (Trental) 400mg, 1 tabl67mwice daily (at 10 AM and 5PM) -Medications previously tried: none  -Recommended to continue current medication  Vitamin B12 deficiency (Goal: vitamin B12 > 211) -Controlled -Current treatment  Cyanocobalamin 1000mcg inject 7m every 30 days -Medications  previously tried: none  -Recommended repeat vitamin B12 level  Glaucoma (Goal: lower intraocular pressure) -Controlled -Current treatment  Dorzolamide/ timolol mealate eye drops, 1 drop in both eyes twice a day -Medications previously tried: n/a  -Recommended to continue current medication  Iron deficiency anemia (Goal: Hgb > 11) -Controlled -Current treatment  Ferrous sulfate 65 mg 1 tablet three times daily -Medications previously tried: none  -Recommended to continue current medication  Stage 4 CKD (Goal: minimize damage to kidneys) -Controlled -Current treatment  No medications -Medications previously tried: n/a  - Continue to adjust medications as needed   Health Maintenance -Vaccine gaps: shingrix -Current therapy:  APAP 574m, 1 tablet every eight hours as needed for mild pain or headache  Bisacodyl (Dulcolax) 552m 1 tablet once daily as needed for moderate constipation  -Educated on Cost vs benefit of each product must be carefully weighed by individual consumer -Patient is satisfied with current therapy and denies issues -Recommended to continue current medication  Patient Goals/Self-Care Activities Patient will:  - take medications as prescribed check glucose daily, document, and provide at future appointments check blood pressure weekly, document, and provide at future appointments target a minimum of 150 minutes of moderate intensity exercise weekly  Follow Up Plan: Telephone follow up appointment  with care management team member scheduled for: 4 months        Medication Assistance: None required.  Patient affirms current coverage meets needs.  Compliance/Adherence/Medication fill history: Care Gaps: Shingrix, foot exam  Star-Rating Drugs: Atorvastatin 8084m [facility] [address] [id number]: [phone]: [phone] [id number] [place] [id number]: [phone]: [phone] pill box? No - patient reports taking for a long time. Does not use pill box. Reports never forgetting dose.  Pt endorses 100% compliance  We discussed: Current pharmacy is preferred with insurance plan and patient is satisfied with pharmacy services Patient decided to: Continue current medication management strategy  Care Plan and Follow Up Patient Decision:  Patient agrees to Care Plan and Follow-up.  Plan: Telephone follow up appointment with care management team member scheduled for:  4 months  MadJeni SallesharmD BCAHaynesarmacist LeBMadrid BraBaltimore [phone]

## 2020-09-21 ENCOUNTER — Other Ambulatory Visit: Payer: Self-pay | Admitting: Interventional Cardiology

## 2020-10-05 DIAGNOSIS — N185 Chronic kidney disease, stage 5: Secondary | ICD-10-CM | POA: Diagnosis not present

## 2020-10-05 DIAGNOSIS — N189 Chronic kidney disease, unspecified: Secondary | ICD-10-CM | POA: Diagnosis not present

## 2020-10-05 DIAGNOSIS — D519 Vitamin B12 deficiency anemia, unspecified: Secondary | ICD-10-CM | POA: Diagnosis not present

## 2020-10-08 ENCOUNTER — Other Ambulatory Visit: Payer: Self-pay

## 2020-10-09 ENCOUNTER — Ambulatory Visit (INDEPENDENT_AMBULATORY_CARE_PROVIDER_SITE_OTHER): Payer: Medicare HMO | Admitting: Internal Medicine

## 2020-10-09 ENCOUNTER — Encounter: Payer: Self-pay | Admitting: Internal Medicine

## 2020-10-09 VITALS — BP 120/60 | HR 51 | Temp 98.1°F | Wt 145.5 lb

## 2020-10-09 DIAGNOSIS — E1122 Type 2 diabetes mellitus with diabetic chronic kidney disease: Secondary | ICD-10-CM | POA: Diagnosis not present

## 2020-10-09 DIAGNOSIS — Z794 Long term (current) use of insulin: Secondary | ICD-10-CM

## 2020-10-09 DIAGNOSIS — E785 Hyperlipidemia, unspecified: Secondary | ICD-10-CM | POA: Diagnosis not present

## 2020-10-09 DIAGNOSIS — N184 Chronic kidney disease, stage 4 (severe): Secondary | ICD-10-CM

## 2020-10-09 DIAGNOSIS — I10 Essential (primary) hypertension: Secondary | ICD-10-CM | POA: Diagnosis not present

## 2020-10-09 DIAGNOSIS — E538 Deficiency of other specified B group vitamins: Secondary | ICD-10-CM | POA: Diagnosis not present

## 2020-10-09 DIAGNOSIS — N185 Chronic kidney disease, stage 5: Secondary | ICD-10-CM

## 2020-10-09 LAB — POCT GLYCOSYLATED HEMOGLOBIN (HGB A1C): Hemoglobin A1C: 6 % — AB (ref 4.0–5.6)

## 2020-10-09 MED ORDER — CYANOCOBALAMIN 1000 MCG/ML IJ SOLN
1000.0000 ug | Freq: Once | INTRAMUSCULAR | Status: AC
  Administered 2020-10-09: 1000 ug via INTRAMUSCULAR

## 2020-10-09 NOTE — Progress Notes (Signed)
Established Patient Office Visit     This visit occurred during the SARS-CoV-2 public health emergency.  Safety protocols were in place, including screening questions prior to the visit, additional usage of staff PPE, and extensive cleaning of exam room while observing appropriate contact time as indicated for disinfecting solutions.    CC/Reason for Visit: 29-monthfollow-up chronic medical conditions  HPI: Brandon TUFOis a 87y.o. male who is coming in today for the above mentioned reasons. Past Medical History is significant for: Chronic combined heart failure with a known ejection fraction of 30 to 35% and grade 2 diastolic dysfunction, hypertension that has been well controlled in the past, coronary artery disease, diabetes which is insulin-dependent,, chronic kidney disease stage V, B12 deficiency.  At last visit his insulin dose was decreased after he had an hypoglycemic episode that caused syncope.  He has been doing well since we last spoke.  He tells me that his CBG this a.m. was 100.  He only does her own 8 units of Lantus and hardly ever uses mealtime insulin unless he eats a significant amount.  He continues to have weight loss and has dropped an extra 7 pounds.   Past Medical/Surgical History: Past Medical History:  Diagnosis Date   Arthritis    "touch in my fingers" (09/10/2014)   Atrophic gastritis    AVM (arteriovenous malformation) of duodenum, acquired    B12 deficiency anemia    "stopped taking the shots in ~ 06/2014" (09/10/2014)   Barrett's esophagus    CAD (coronary artery disease)    a. H/o MI in 1998;  b. s/p CABG 2003. c. 10/2014 NSTEMI/Cath: LM 75, LAD 1012m, D1 95, D2 50, LCX 100ost/p, OM1 80, OM2 80, RCA 100p/m, RPDA fills via L->L collats, LIMA->LAD ok, VG->dRCA 100, VG->OM1->OM2 99 prox to OM1 insertion->recanalated ->Tx with heparin,  VG->D1 100-->Med Rx.   Cataract    Chronic systolic CHF (congestive heart failure) (HCF[place]   a. EF 40-45% in  10/2014 (previously normal in 08/2014).   CKD (chronic kidney disease), stage IV (HCC)    Diabetes 1.5, managed as type 1 ([place]   Hiatal hernia 2018   endoscopy -Dr. ArHavery Moros History of stomach ulcers 2013   Hyperlipidemia    Hypertension    Hypertensive heart disease    Ischemic cardiomyopathy    a. 10/2014 Echo: EF 40-45%.   [facility]   PVD (peripheral vascular disease) ([place]   Type II diabetes mellitus (HC[place]    Past Surgical History:  Procedure Laterality Date   CARDIAC CATHETERIZATION  2003;    CARDIAC CATHETERIZATION N/A 11/07/2014   Left Heart Cath; HeBelva CromeMD;    CATARACT EXTRACTION, BILATERAL Bilateral    CHOLECYSTECTOMY N/A 05/20/2015   Procedure: LAPAROSCOPIC CHOLECYSTECTOMY;  Surgeon: DoCoralie KeensMD;  Location: [facility] Service: General;  Laterality: N/A;   COLONOSCOPY     COLONOSCOPY N/A 05/12/2015   Procedure: COLONOSCOPY;  Surgeon: DaMilus BanisterMD;  Location: [facility] Service: Endoscopy;  Laterality: N/A;   CORONARY ANGIOPLASTY  1998   /notes 07/13/2010   CORONARY ARTERY BYPASS GRAFT  2003   CABG X5   ESOPHAGOGASTRODUODENOSCOPY  06/10/2011   Procedure: ESOPHAGOGASTRODUODENOSCOPY (EGD);  Surgeon: MaLadene ArtistMD,FACG;  [facility];  Service: Endoscopy;  Laterality: N/A;   ESOPHAGOGASTRODUODENOSCOPY N/A 10/04/2016   Procedure: ESOPHAGOGASTRODUODENOSCOPY (EGD);  Surgeon: ArManus GunningMD;  [facility];  Service:  Gastroenterology;  Laterality: N/A;    Social History:  reports that he has quit smoking. His smoking use included cigarettes. He has a 10.00 pack-year smoking history. He has never used smokeless tobacco. He reports that he does not drink alcohol and does not use drugs.  Allergies: No Known Allergies  Family History:  Family History  Problem Relation Age of Onset   Stroke Mother    Hypertension Mother    Cancer Mother    Diabetes Other        brothers and sisters   Coronary  artery disease Other    Hypertension Sister    Heart attack Sister    Colon cancer Neg Hx      Current Outpatient Medications:    Accu-Chek Softclix Lancets lancets, CHECK BLOOD GLUCOSE once daily, Disp: 100 each, Rfl: 12   acetaminophen (TYLENOL) 500 MG tablet, Take 500 mg by mouth every 8 (eight) hours as needed for mild pain or headache., Disp: , Rfl:    Alcohol Swabs (ALCOHOL PREP) PADS, USE TO TEST BLOOD GLUCOSE THREE TIMES DAILY, Disp: 300 each, Rfl: 3   amLODipine (NORVASC) 10 MG tablet, TAKE 1 TABLET EVERY DAY, Disp: 90 tablet, Rfl: 1   aspirin EC 81 MG tablet, Take 81 mg by mouth at bedtime., Disp: , Rfl:    atorvastatin (LIPITOR) 80 MG tablet, TAKE 1 TABLET EVERY DAY  AT  6PM, Disp: 90 tablet, Rfl: 2   BD PEN NEEDLE NANO U/F 32G X 4 MM MISC, INJECT TWO TIMES DAILY AS NEEDED., Disp: 180 each, Rfl: 3   bisacodyl (DULCOLAX) 5 MG EC tablet, Take 5 mg by mouth daily as needed for moderate constipation., Disp: , Rfl:    Blood Glucose Monitoring Suppl (ACCU-CHEK AVIVA PLUS) w/Device KIT, USE AS DIRECTED, Disp: 1 kit, Rfl: 0   carvedilol (COREG) 25 MG tablet, TAKE 1 TABLET TWICE DAILY WITH MEALS, Disp: 180 tablet, Rfl: 0   cloNIDine (CATAPRES) 0.1 MG tablet, TAKE 1 TABLET AT BEDTIME, Disp: 90 tablet, Rfl: 1   COLCRYS 0.6 MG tablet, TAKE 1 TABLET EVERY DAY, Disp: 90 tablet, Rfl: 1   Continuous Blood Gluc Receiver (FREESTYLE LIBRE 14 DAY READER) DEVI, 1 each by Does not apply route daily., Disp: 1 each, Rfl: 2   Continuous Blood Gluc Sensor (FREESTYLE LIBRE 14 DAY SENSOR) MISC, 1 each by Does not apply route daily., Disp: 2 each, Rfl: 3   dorzolamide-timolol (COSOPT) 22.3-6.8 MG/ML ophthalmic solution, Place 1 drop into both eyes 2 (two) times daily., Disp: , Rfl:    Ferrous Sulfate (IRON) 325 (65 Fe) MG TABS, Take 1 tablet by mouth in the morning, at noon, and at bedtime., Disp: , Rfl:    furosemide (LASIX) 80 MG tablet, TAKE 1 TABLET EVERY DAY, Disp: 90 tablet, Rfl: 3   glucose blood  (ACCU-CHEK GUIDE) test strip, Test once daily, Disp: 100 each, Rfl: 12   hydrALAZINE (APRESOLINE) 25 MG tablet, Take 1 tablet (25 mg total) by mouth 3 (three) times daily., Disp: 270 tablet, Rfl: 1   insulin aspart (NOVOLOG) 100 UNIT/ML FlexPen, Inject 5 Units into the skin 3 (three) times daily with meals., Disp: , Rfl:    insulin glargine (LANTUS SOLOSTAR) 100 UNIT/ML Solostar Pen, Inject 14 Units into the skin daily., Disp: , Rfl:    isosorbide mononitrate (IMDUR) 60 MG 24 hr tablet, TAKE 2 TABLETS (120 MG TOTAL) BY MOUTH DAILY., Disp: 180 tablet, Rfl: 3   Lancet Devices (ACCU-CHEK SOFTCLIX) lancets, USE TO TEST BLOOD  GLUCOSE THREE TIMES DAILY, Disp: 1 each, Rfl: 0   latanoprost (XALATAN) 0.005 % ophthalmic solution, Place 1 drop into both eyes at bedtime. , Disp: , Rfl:    nitroGLYCERIN (NITROSTAT) 0.4 MG SL tablet, Place 1 tablet (0.4 mg total) under the tongue every 5 (five) minutes as needed for chest pain (3 doses MAX)., Disp: 75 tablet, Rfl: 1   omeprazole (PRILOSEC) 20 MG capsule, TAKE 1 CAPSULE EVERY DAY, Disp: 90 capsule, Rfl: 4   OVER THE COUNTER MEDICATION, Take 1 tablet by mouth daily. Prostate formula, Disp: , Rfl:    pentoxifylline (TRENTAL) 400 MG CR tablet, TAKE 1 TABLET TWICE DAILY AT 10AM AND 5PM, Disp: 180 tablet, Rfl: 1   potassium chloride SA (KLOR-CON) 20 MEQ tablet, TAKE 1 TABLET EVERY DAY, Disp: 90 tablet, Rfl: 0   timolol (TIMOPTIC) 0.5 % ophthalmic solution, Place 1 drop into both eyes in the morning and at bedtime., Disp: , Rfl:   Review of Systems:  Constitutional: Denies fever, chills, diaphoresis, appetite change and fatigue.  HEENT: Denies photophobia, eye pain, redness, hearing loss, ear pain, congestion, sore throat, rhinorrhea, sneezing, mouth sores, trouble swallowing, neck pain, neck stiffness and tinnitus.   Respiratory: Denies SOB, DOE, cough, chest tightness,  and wheezing.   Cardiovascular: Denies chest pain, palpitations and leg swelling.   Gastrointestinal: Denies nausea, vomiting, abdominal pain, diarrhea, constipation, blood in stool and abdominal distention.  Genitourinary: Denies dysuria, urgency, frequency, hematuria, flank pain and difficulty urinating.  Endocrine: Denies: hot or cold intolerance, sweats, changes in hair or nails, polyuria, polydipsia. Musculoskeletal: Denies myalgias, back pain, joint swelling, arthralgias and gait problem.  Skin: Denies pallor, rash and wound.  Neurological: Denies dizziness, seizures, syncope, weakness, light-headedness, numbness and headaches.  Hematological: Denies adenopathy. Easy bruising, personal or family bleeding history  Psychiatric/Behavioral: Denies suicidal ideation, mood changes, confusion, nervousness, sleep disturbance and agitation    Physical Exam: Vitals:   10/09/20 1019  BP: 120/60  Pulse: (!) 51  Temp: 98.1 F (36.7 C)  TempSrc: Oral  SpO2: 97%  Weight: 145 lb 8 oz (66 kg)    Body mass index is 20.88 kg/m.   Constitutional: NAD, calm, comfortable Eyes: PERRL, lids and conjunctivae normal ENMT: Mucous membranes are moist.  Respiratory: clear to auscultation bilaterally, no wheezing, no crackles. Normal respiratory effort. No accessory muscle use.  Cardiovascular: Regular rate and rhythm, no murmurs / rubs / gallops. No extremity edema.  Neurologic: Grossly intact and nonfocal Psychiatric: Normal judgment and insight. Alert and oriented x 3. Normal mood.    Impression and Plan:  Essential hypertension -Well-controlled.  Type 2 diabetes mellitus with stage 4 chronic kidney disease, with long-term current use of insulin (HCC)  -A1c demonstrates good control at 6. -He has decreased his basal insulin to 8 units and rarely takes mealtime insulin unless he eats a substantial amount.  B12 deficiency -IM B12 given today.  Dyslipidemia -Last lipid panel in 2020 with a total cholesterol of 120, triglycerides 215 and LDL 61.  CKD (chronic kidney  disease) stage 5, GFR less than 15 ml/min (HCC) -Baseline creatinine remains around 4.2.  He follows routinely with nephrology.  Time spent: 32 minutes reviewing chart, interviewing and examining patient and formulating plan of care.    Lelon Frohlich, MD [facility] at [facility]

## 2020-10-12 ENCOUNTER — Ambulatory Visit: Payer: Medicare HMO

## 2020-10-16 DIAGNOSIS — E611 Iron deficiency: Secondary | ICD-10-CM | POA: Diagnosis not present

## 2020-10-16 DIAGNOSIS — N185 Chronic kidney disease, stage 5: Secondary | ICD-10-CM | POA: Diagnosis not present

## 2020-10-16 DIAGNOSIS — I120 Hypertensive chronic kidney disease with stage 5 chronic kidney disease or end stage renal disease: Secondary | ICD-10-CM | POA: Diagnosis not present

## 2020-10-16 DIAGNOSIS — D631 Anemia in chronic kidney disease: Secondary | ICD-10-CM | POA: Diagnosis not present

## 2020-10-16 DIAGNOSIS — N2581 Secondary hyperparathyroidism of renal origin: Secondary | ICD-10-CM | POA: Diagnosis not present

## 2020-10-16 DIAGNOSIS — E1151 Type 2 diabetes mellitus with diabetic peripheral angiopathy without gangrene: Secondary | ICD-10-CM | POA: Diagnosis not present

## 2020-10-19 ENCOUNTER — Other Ambulatory Visit: Payer: Self-pay

## 2020-10-20 ENCOUNTER — Ambulatory Visit (INDEPENDENT_AMBULATORY_CARE_PROVIDER_SITE_OTHER): Payer: Medicare HMO | Admitting: Internal Medicine

## 2020-10-20 ENCOUNTER — Encounter: Payer: Self-pay | Admitting: Internal Medicine

## 2020-10-20 VITALS — BP 120/68 | HR 58 | Temp 98.2°F | Wt 144.4 lb

## 2020-10-20 DIAGNOSIS — H9202 Otalgia, left ear: Secondary | ICD-10-CM | POA: Diagnosis not present

## 2020-10-20 NOTE — Progress Notes (Signed)
Established Patient Office Visit     This visit occurred during the SARS-CoV-2 public health emergency.  Safety protocols were in place, including screening questions prior to the visit, additional usage of staff PPE, and extensive cleaning of exam room while observing appropriate contact time as indicated for disinfecting solutions.    CC/Reason for Visit: Left ear scab  HPI: [name] is a 87 y.o. male who is coming in today for the above mentioned reasons.  He went to [employer] to be fitted for hearing aids and was told they were unable to do it given a scab in his left ear canal.  He tells me that he had been going to a previous audiologist and feels like at that appointment they put something in his ear and when they pulled it out he felt a sting.  He has not noticed any significant bloody discharge.  His son is anxious as he really wants his father to be fitted with hearing aids given severe hearing loss.  Past Medical/Surgical History: Past Medical History:  Diagnosis Date   Arthritis    "touch in my fingers" (09/10/2014)   Atrophic gastritis    AVM (arteriovenous malformation) of duodenum, acquired    B12 deficiency anemia    "stopped taking the shots in ~ 06/2014" (09/10/2014)   Barrett's esophagus    CAD (coronary artery disease)    a. H/o MI in 1998;  b. s/p CABG 2003. c. 10/2014 NSTEMI/Cath: LM 75, LAD 133md, D1 95, D2 50, LCX 100ost/p, OM1 80, OM2 80, RCA 100p/m, RPDA fills via L->L collats, LIMA->LAD ok, VG->dRCA 100, VG->OM1->OM2 99 prox to OM1 insertion->recanalated ->Tx with heparin,  VG->D1 100-->Med Rx.   Cataract    Chronic systolic CHF (congestive heart failure) ([place]    a. EF 40-45% in 10/2014 (previously normal in 08/2014).   CKD (chronic kidney disease), stage IV (HCC)    Diabetes 1.5, [place] as type 1 ([place]    Hiatal hernia 2018   endoscopy -Dr. AHavery Moros  History of stomach ulcers 2013   Hyperlipidemia    Hypertension    Hypertensive heart  disease    Ischemic cardiomyopathy    a. 10/2014 Echo: EF 40-45%.   [facility]ocardial infarction ([facility]    PVD (peripheral vascular disease) ([place]    Type II diabetes mellitus ([place]     Past Surgical History:  Procedure Laterality Date   CARDIAC CATHETERIZATION  2003;    CARDIAC CATHETERIZATION N/A 11/07/2014   Left Heart Cath; HBelva Crome MD;    CATARACT EXTRACTION, BILATERAL Bilateral    CHOLECYSTECTOMY N/A 05/20/2015   Procedure: LAPAROSCOPIC CHOLECYSTECTOMY;  Surgeon: DCoralie Keens MD;  [facility] [facility]  Service: General;  Laterality: N/A;   COLONOSCOPY     COLONOSCOPY N/A 05/12/2015   Procedure: COLONOSCOPY;  Surgeon: DMilus Banister MD;  [place]  Service: Endoscopy;  Laterality: N/A;   CORONARY ANGIOPLASTY  1998   /notes 07/13/2010   CORONARY ARTERY BYPASS GRAFT  2003   CABG X5   ESOPHAGOGASTRODUODENOSCOPY  06/10/2011   Procedure: ESOPHAGOGASTRODUODENOSCOPY (EGD);  Surgeon: MLadene Artist MD,FACG;  [facility]  Service: Endoscopy;  Laterality: N/A;   ESOPHAGOGASTRODUODENOSCOPY N/A 10/04/2016   Procedure: ESOPHAGOGASTRODUODENOSCOPY (EGD);  Surgeon: AManus Gunning MD;  [facility];  Service: Gastroenterology;  Laterality: N/A;    Social History:  reports that he has quit smoking. His smoking use included cigarettes. He has a 10.00 pack-year smoking history. He has never used  smokeless tobacco. He reports that he does not drink alcohol and does not use drugs.  Allergies: No Known Allergies  Family History:  Family History  Problem Relation Age of Onset   Stroke Mother    Hypertension Mother    Cancer Mother    Diabetes Other        brothers and sisters   Coronary artery disease Other    Hypertension Sister    Heart attack Sister    Colon cancer Neg Hx      Current Outpatient Medications:    Accu-Chek Softclix Lancets lancets, CHECK BLOOD GLUCOSE once daily, Disp: 100 each, Rfl: 12   acetaminophen (TYLENOL) 500 MG tablet,  Take 500 mg by mouth every 8 (eight) hours as needed for mild pain or headache., Disp: , Rfl:    Alcohol Swabs (ALCOHOL PREP) PADS, USE TO TEST BLOOD GLUCOSE THREE TIMES DAILY, Disp: 300 each, Rfl: 3   amLODipine (NORVASC) 10 MG tablet, TAKE 1 TABLET EVERY DAY, Disp: 90 tablet, Rfl: 1   aspirin EC 81 MG tablet, Take 81 mg by mouth at bedtime., Disp: , Rfl:    atorvastatin (LIPITOR) 80 MG tablet, TAKE 1 TABLET EVERY DAY  AT  6PM, Disp: 90 tablet, Rfl: 2   BD PEN NEEDLE NANO U/F 32G X 4 MM MISC, INJECT TWO TIMES DAILY AS NEEDED., Disp: 180 each, Rfl: 3   bisacodyl (DULCOLAX) 5 MG EC tablet, Take 5 mg by mouth daily as needed for moderate constipation., Disp: , Rfl:    Blood Glucose Monitoring Suppl (ACCU-CHEK AVIVA PLUS) w/Device KIT, USE AS DIRECTED, Disp: 1 kit, Rfl: 0   carvedilol (COREG) 25 MG tablet, TAKE 1 TABLET TWICE DAILY WITH MEALS, Disp: 180 tablet, Rfl: 0   cloNIDine (CATAPRES) 0.1 MG tablet, TAKE 1 TABLET AT BEDTIME, Disp: 90 tablet, Rfl: 1   COLCRYS 0.6 MG tablet, TAKE 1 TABLET EVERY DAY, Disp: 90 tablet, Rfl: 1   Continuous Blood Gluc Receiver (FREESTYLE LIBRE 14 DAY READER) DEVI, 1 each by Does not apply route daily., Disp: 1 each, Rfl: 2   Continuous Blood Gluc Sensor (FREESTYLE LIBRE 14 DAY SENSOR) MISC, 1 each by Does not apply route daily., Disp: 2 each, Rfl: 3   dorzolamide-timolol (COSOPT) 22.3-6.8 MG/ML ophthalmic solution, Place 1 drop into both eyes 2 (two) times daily., Disp: , Rfl:    Ferrous Sulfate (IRON) 325 (65 Fe) MG TABS, Take 1 tablet by mouth in the morning, at noon, and at bedtime., Disp: , Rfl:    furosemide (LASIX) 80 MG tablet, TAKE 1 TABLET EVERY DAY, Disp: 90 tablet, Rfl: 3   glucose blood (ACCU-CHEK GUIDE) test strip, Test once daily, Disp: 100 each, Rfl: 12   hydrALAZINE (APRESOLINE) 25 MG tablet, Take 1 tablet (25 mg total) by mouth 3 (three) times daily., Disp: 270 tablet, Rfl: 1   insulin aspart (NOVOLOG) 100 UNIT/ML FlexPen, Inject 5 Units into the skin  3 (three) times daily with meals., Disp: , Rfl:    insulin glargine (LANTUS SOLOSTAR) 100 UNIT/ML Solostar Pen, Inject 14 Units into the skin daily., Disp: , Rfl:    isosorbide mononitrate (IMDUR) 60 MG 24 hr tablet, TAKE 2 TABLETS (120 MG TOTAL) BY MOUTH DAILY., Disp: 180 tablet, Rfl: 3   Lancet Devices (ACCU-CHEK SOFTCLIX) lancets, USE TO TEST BLOOD GLUCOSE THREE TIMES DAILY, Disp: 1 each, Rfl: 0   latanoprost (XALATAN) 0.005 % ophthalmic solution, Place 1 drop into both eyes at bedtime. , Disp: , Rfl:  nitroGLYCERIN (NITROSTAT) 0.4 MG SL tablet, Place 1 tablet (0.4 mg total) under the tongue every 5 (five) minutes as needed for chest pain (3 doses MAX)., Disp: 75 tablet, Rfl: 1   omeprazole (PRILOSEC) 20 MG capsule, TAKE 1 CAPSULE EVERY DAY, Disp: 90 capsule, Rfl: 4   OVER THE COUNTER MEDICATION, Take 1 tablet by mouth daily. Prostate formula, Disp: , Rfl:    pentoxifylline (TRENTAL) 400 MG CR tablet, TAKE 1 TABLET TWICE DAILY AT 10AM AND 5PM, Disp: 180 tablet, Rfl: 1   potassium chloride SA (KLOR-CON) 20 MEQ tablet, TAKE 1 TABLET EVERY DAY, Disp: 90 tablet, Rfl: 0   timolol (TIMOPTIC) 0.5 % ophthalmic solution, Place 1 drop into both eyes in the morning and at bedtime., Disp: , Rfl:   Review of Systems:  Constitutional: Denies fever, chills, diaphoresis, appetite change and fatigue.  HEENT: Denies photophobia, eye pain, redness, hearing loss, ear pain, congestion, sore throat, rhinorrhea, sneezing, mouth sores, trouble swallowing, neck pain, neck stiffness and tinnitus.   Respiratory: Denies SOB, DOE, cough, chest tightness,  and wheezing.   Cardiovascular: Denies chest pain, palpitations and leg swelling.  Gastrointestinal: Denies nausea, vomiting, abdominal pain, diarrhea, constipation, blood in stool and abdominal distention.  Genitourinary: Denies dysuria, urgency, frequency, hematuria, flank pain and difficulty urinating.  Endocrine: Denies: hot or cold intolerance, sweats, changes in  hair or nails, polyuria, polydipsia. Musculoskeletal: Denies myalgias, back pain, joint swelling, arthralgias and gait problem.  Skin: Denies pallor, rash and wound.  Neurological: Denies dizziness, seizures, syncope, weakness, light-headedness, numbness and headaches.  Hematological: Denies adenopathy. Easy bruising, personal or family bleeding history  Psychiatric/Behavioral: Denies suicidal ideation, mood changes, confusion, nervousness, sleep disturbance and agitation    Physical Exam: Vitals:   10/20/20 1437  BP: 120/68  Pulse: (!) 58  Temp: 98.2 F (36.8 C)  TempSrc: Oral  SpO2: 97%  Weight: 144 lb 6.4 oz (65.5 kg)    Body mass index is 20.72 kg/m.   Constitutional: NAD, calm, comfortable Eyes: PERRL, lids and conjunctivae normal ENMT: Mucous membranes are moist.  Scab in the outer left ear canal.   Impression and Plan:  Left ear pain -I have advised time and observation, however son would prefer ENT referral, I will go ahead and place today.    Lelon Frohlich, MD  Primary Care at [facility]

## 2020-10-25 ENCOUNTER — Other Ambulatory Visit: Payer: Self-pay | Admitting: Internal Medicine

## 2020-10-25 ENCOUNTER — Other Ambulatory Visit: Payer: Self-pay | Admitting: Interventional Cardiology

## 2020-10-28 NOTE — Progress Notes (Signed)
Cardiology Office Note:    Date:  10/30/2020   ID:  [name], DOB 21-Oct-1931, MRN [id number]  PCP:  [name], [name], MD  Cardiologist:  [name], MD   Referring MD: [name], [name]*   No chief complaint on file.   History of Present Illness:    [name] is a 87 y.o. male with a hx of CAD, bypass graft failure with only remaining conduit LIMA to LAD by cath in September 2016, chronic kidney disease stage IV, diabetes mellitus II, chronic systolic heart failure with LVEF 35-40%, peripheral arterial disease, hypertension, stomach ulcers (with GI bleeding while on Plavix), and hyperlipidemia.   He feels well.  No particular complaints other than he is "slowing down".  He has not needed nitroglycerin.  There is no orthopnea.  No lower extremity swelling.  He just cannot do what he would like to do because it gives out.  Appetite is poor.  He still follows with nephrology.  He still lives independently.  Past Medical History:  Diagnosis Date   Arthritis    "touch in my fingers" (09/10/2014)   Atrophic gastritis    AVM (arteriovenous malformation) of duodenum, acquired    B12 deficiency anemia    "stopped taking the shots in ~ 06/2014" (09/10/2014)   Barrett's esophagus    CAD (coronary artery disease)    a. H/o MI in 1998;  b. s/p CABG 2003. c. 10/2014 NSTEMI/Cath: LM 75, LAD 164md, D1 95, D2 50, LCX 100ost/p, OM1 80, OM2 80, RCA 100p/m, RPDA fills via L->L collats, LIMA->LAD ok, VG->dRCA 100, VG->OM1->OM2 99 prox to OM1 insertion->recanalated ->Tx with heparin,  VG->D1 100-->Med Rx.   Cataract    Chronic systolic CHF (congestive heart failure) ([facility] [place]    a. EF 40-45% in 10/2014 (previously normal in 08/2014).   CKD (chronic kidney disease), stage IV (HCC)    Diabetes 1.5, [place] as type 1 [place]    Hiatal hernia 2018   endoscopy -Dr. AHavery Moros  History of stomach ulcers 2013   Hyperlipidemia    Hypertension    Hypertensive heart disease     Ischemic cardiomyopathy    a. 10/2014 Echo: EF 40-45%.   Myocardial infarction ([facility]    PVD (peripheral vascular disease) [place]    Type II diabetes mellitus [place]     Past Surgical History:  Procedure Laterality Date   CARDIAC CATHETERIZATION  2003;    CARDIAC CATHETERIZATION N/A 11/07/2014   Left Heart Cath; HBelva Crome MD;    CATARACT EXTRACTION, BILATERAL Bilateral    CHOLECYSTECTOMY N/A 05/20/2015   Procedure: LAPAROSCOPIC CHOLECYSTECTOMY;  Surgeon: DCoralie Keens MD;  [place] [facility] [place]  Service: General;  Laterality: N/A;   COLONOSCOPY     COLONOSCOPY N/A 05/12/2015   Procedure: COLONOSCOPY;  Surgeon: DMilus Banister MD;  [place]  Service: Endoscopy;  Laterality: N/A;   CORONARY ANGIOPLASTY  1998   /notes 07/13/2010   CORONARY ARTERY BYPASS GRAFT  2003   CABG X5   ESOPHAGOGASTRODUODENOSCOPY  06/10/2011   Procedure: ESOPHAGOGASTRODUODENOSCOPY (EGD);  Surgeon: MLadene Artist MD,FACG;  [facility]  Service: Endoscopy;  Laterality: N/A;   ESOPHAGOGASTRODUODENOSCOPY N/A 10/04/2016   Procedure: ESOPHAGOGASTRODUODENOSCOPY (EGD);  Surgeon: AManus Gunning MD;  [facility];  Service: Gastroenterology;  Laterality: N/A;    Current Medications: Current Meds  Medication Sig   Accu-Chek Softclix Lancets lancets CHECK BLOOD GLUCOSE once daily   acetaminophen (TYLENOL) 500 MG tablet Take 500  mg by mouth every 8 (eight) hours as needed for mild pain or headache.   Alcohol Swabs (ALCOHOL PREP) PADS USE TO TEST BLOOD GLUCOSE THREE TIMES DAILY   amLODipine (NORVASC) 10 MG tablet TAKE 1 TABLET EVERY DAY   aspirin EC 81 MG tablet Take 81 mg by mouth at bedtime.   atorvastatin (LIPITOR) 80 MG tablet TAKE 1 TABLET EVERY DAY  AT  6PM   BD PEN NEEDLE NANO U/F 32G X 4 MM MISC INJECT TWO TIMES DAILY AS NEEDED.   bisacodyl (DULCOLAX) 5 MG EC tablet Take 5 mg by mouth daily as needed for moderate constipation.   Blood Glucose Monitoring Suppl  (ACCU-CHEK AVIVA PLUS) w/Device KIT USE AS DIRECTED   brimonidine (ALPHAGAN) 0.2 % ophthalmic solution Place 1 drop into both eyes 2 (two) times daily.   carvedilol (COREG) 12.5 MG tablet Take 1 tablet (12.5 mg total) by mouth 2 (two) times daily.   cloNIDine (CATAPRES) 0.1 MG tablet TAKE 1 TABLET AT BEDTIME   COLCRYS 0.6 MG tablet TAKE 1 TABLET EVERY DAY   Continuous Blood Gluc Receiver (FREESTYLE LIBRE 14 DAY READER) DEVI 1 each by Does not apply route daily.   Continuous Blood Gluc Sensor (FREESTYLE LIBRE 14 DAY SENSOR) MISC 1 each by Does not apply route daily.   dorzolamide-timolol (COSOPT) 22.3-6.8 MG/ML ophthalmic solution Place 1 drop into both eyes 2 (two) times daily.   Ferrous Sulfate (IRON) 325 (65 Fe) MG TABS Take 1 tablet by mouth in the morning, at noon, and at bedtime.   furosemide (LASIX) 80 MG tablet TAKE 1 TABLET EVERY DAY   glucose blood (ACCU-CHEK GUIDE) test strip Test once daily   hydrALAZINE (APRESOLINE) 25 MG tablet Take 1 tablet (25 mg total) by mouth 3 (three) times daily.   insulin aspart (NOVOLOG) 100 UNIT/ML FlexPen Inject 5 Units into the skin 3 (three) times daily with meals.   insulin glargine (LANTUS SOLOSTAR) 100 UNIT/ML Solostar Pen Inject 14 Units into the skin daily.   isosorbide mononitrate (IMDUR) 60 MG 24 hr tablet TAKE 2 TABLETS (120 MG TOTAL) BY MOUTH DAILY.   Lancet Devices (ACCU-CHEK SOFTCLIX) lancets USE TO TEST BLOOD GLUCOSE THREE TIMES DAILY   latanoprost (XALATAN) 0.005 % ophthalmic solution Place 1 drop into both eyes at bedtime.    nitroGLYCERIN (NITROSTAT) 0.4 MG SL tablet Place 1 tablet (0.4 mg total) under the tongue every 5 (five) minutes as needed for chest pain (3 doses MAX).   omeprazole (PRILOSEC) 20 MG capsule TAKE 1 CAPSULE EVERY DAY   OVER THE COUNTER MEDICATION Take 1 tablet by mouth daily. Prostate formula   pentoxifylline (TRENTAL) 400 MG CR tablet TAKE 1 TABLET TWICE DAILY AT 10AM AND 5PM   potassium chloride SA (KLOR-CON) 20 MEQ  tablet TAKE 1 TABLET EVERY DAY   timolol (TIMOPTIC) 0.5 % ophthalmic solution Place 1 drop into both eyes in the morning and at bedtime.   [DISCONTINUED] carvedilol (COREG) 25 MG tablet TAKE 1 TABLET TWICE DAILY WITH MEALS     Allergies:   Patient has no known allergies.   Social History   Socioeconomic History   Marital status: Widowed    Spouse name: Not on file   Number of children: Not on file   Years of education: Not on file   Highest education level: Not on file  Occupational History   Occupation: Retired  Tobacco Use   Smoking status: Former    Packs/day: 0.50    Years: 20.00  Pack years: 10.00    Types: Cigarettes   Smokeless tobacco: Never   Tobacco comments:    "quit smoking cigarettes in the 1960s   Vaping Use   Vaping Use: Never used  Substance and Sexual Activity   Alcohol use: No   Drug use: No   Sexual activity: Not Currently  Other Topics Concern   Not on file  Social History Narrative   Pt lives alone, family nearby.   Social Determinants of Health   Financial Resource Strain: Not on file  Food Insecurity: Not on file  Transportation Needs: Not on file  Physical Activity: Not on file  Stress: Not on file  Social Connections: Not on file     Family History: The patient's family history includes Cancer in his mother; Coronary artery disease in an other family member; Diabetes in an other family member; Heart attack in his sister; Hypertension in his mother and sister; Stroke in his mother. There is no history of Colon cancer.  ROS:   Please see the history of present illness.    He is happy with his current lifestyle.  Denies edema.  Has not had syncope.  All other systems reviewed and are negative.  EKGs/Labs/Other Studies Reviewed:    The following studies were reviewed today: No new imaging.  Last echocardiogram 07/2019: IMPRESSIONS     1. Left ventricular ejection fraction, by estimation, is 30 to 35%. The  left ventricle has  moderately decreased function. The left ventricle  demonstrates regional wall motion abnormalities (see scoring  diagram/findings for description). The left  ventricular internal cavity size was mildly to moderately dilated. Left  ventricular diastolic parameters are consistent with Grade III diastolic  dysfunction (restrictive). There is moderate akinesis of the left  ventricular, entire inferior wall.   2. Right ventricular systolic function is moderately reduced. The right  ventricular size is moderately enlarged. There is severely elevated  pulmonary artery systolic pressure. The estimated right ventricular  systolic pressure is 89.1 mmHg.   3. Left atrial size was moderately dilated.   4. Right atrial size was mildly dilated.   5. The mitral valve is grossly normal. Mild mitral valve regurgitation.  No evidence of mitral stenosis.   6. The aortic valve is tricuspid. Aortic valve regurgitation is mild. No  aortic stenosis is present.   7. Aortic dilatation noted. There is moderate dilatation of the ascending  aorta measuring 44 mm.    EKG:  EKG sinus bradycardia, 52 bpm, first-degree AV block at 296 ms.  Interventricular conduction delay, anterolateral T wave inversion.  Arm lead reversal suspected.  When compared to May, right axis shift is noted.  Recent Labs: 05/01/2020: Platelets 141.0 07/15/2020: Hemoglobin 9.3  Recent Lipid Panel    Component Value Date/Time   CHOL 120 11/21/2018 0958   TRIG 215 (H) 11/21/2018 0958   HDL 23 (L) 11/21/2018 0958   CHOLHDL 5.2 (H) 11/21/2018 0958   CHOLHDL 4 08/02/2017 1352   VLDL 34.2 08/02/2017 1352   LDLCALC 61 11/21/2018 0958   LDLDIRECT 93.3 01/22/2013 0854    Physical Exam:    VS:  BP (!) 118/52   Pulse (!) 52   Ht 5' 10"  (1.778 m)   Wt 149 lb (67.6 kg)   SpO2 98%   BMI 21.38 kg/m     Wt Readings from Last 3 Encounters:  10/30/20 149 lb (67.6 kg)  10/20/20 144 lb 6.4 oz (65.5 kg)  10/09/20 145 lb 8 oz (66 kg)  GEN:  Appears younger than stated age.  Slender.. No acute distress HEENT: Normal NECK: No JVD. LYMPHATICS: No lymphadenopathy CARDIAC: 2/6 systolic right upper sternal murmur. RRR S4 without S3 gallop, or edema. VASCULAR:  Normal Pulses. No bruits. RESPIRATORY:  Clear to auscultation without rales, wheezing or rhonchi  ABDOMEN: Soft, non-tender, non-distended, No pulsatile mass, MUSCULOSKELETAL: No deformity  SKIN: Warm and dry NEUROLOGIC:  Alert and oriented x 3 PSYCHIATRIC:  Normal affect   ASSESSMENT:    1. Chronic combined systolic and diastolic heart failure ([place])   2. Coronary artery disease involving 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 ([place])   3. CKD (chronic kidney disease), stage IV ([place])   4. Essential hypertension   5. Dyslipidemia   6. Diabetes mellitus due to underlying condition with diabetic nephropathy, with long-term current use of insulin (HCC)    PLAN:    In order of problems listed above:  First-degree AV block with interventricular conduction delay and relatively low blood pressure compels me to decrease carvedilol to 12.5 mg twice daily.  He should notify us immediately if angina, racing heart, of the cardiac complaints. Secondary prevention discussed Stage IV CKD and being followed by nephrology. Blood pressure is too low for his current age.  Carvedilol decreased to 12.5 mg twice daily. Continue Lipitor 80 mg/day. Continue management of diabetes with insulin.    Overall education and awareness concerning secondary risk prevention was discussed in detail: LDL less than 70, hemoglobin A1c less than 7, blood pressure target less than 130/80 mmHg, >150 minutes of moderate aerobic activity per week, avoidance of smoking, weight control (via diet and exercise), and continued surveillance/management of/for obstructive sleep apnea.    Medication Adjustments/Labs and Tests Ordered: Current medicines are reviewed at length with the patient  today.  Concerns regarding medicines are outlined above.  Orders Placed This Encounter  Procedures   EKG 12-Lead   Meds ordered this encounter  Medications   carvedilol (COREG) 12.5 MG tablet    Sig: Take 1 tablet (12.5 mg total) by mouth 2 (two) times daily.    Dispense:  180 tablet    Refill:  3    Dose change    Patient Instructions  Medication Instructions:  1) DECREASE Carvedilol to 12.42m twice daily  *If you need a refill on your cardiac medications before your next appointment, please call your pharmacy*   Lab Work: None If you have labs (blood work) drawn today and your tests are completely normal, you will receive your results only by: [place](if you have MyChart) OR A paper copy in the mail If you have any lab test that is abnormal or we need to change your treatment, we will call you to review the results.   Testing/Procedures: None   Follow-Up: At [facility] you and your health needs are our priority.  As part of our continuing mission to provide you with exceptional heart care, we have created designated Provider Care Teams.  These Care Teams include your primary Cardiologist (physician) and Advanced Practice Providers (APPs -  Physician Assistants and Nurse Practitioners) who all work together to provide you with the care you need, when you need it.  We recommend signing up for the patient portal called "MyChart".  Sign up information is provided on this After Visit Summary.  MyChart is used to connect with patients for Virtual Visits (Telemedicine).  Patients are able to view lab/test results, encounter notes, upcoming appointments, etc.  Non-urgent messages can  be sent to your provider as well.   To learn more about what you can do with MyChart, go to [email or web address].    Your next appointment:   6 month(s)  The format for your next appointment:   In Person  Provider:   You may see [name], MD or one of the following  Advanced Practice Providers on your designated Care Team:   Cecilie Kicks, NP   Other Instructions     Signed, [name], MD  10/30/2020 12:00 PM    [place]

## 2020-10-30 ENCOUNTER — Encounter: Payer: Self-pay | Admitting: Interventional Cardiology

## 2020-10-30 ENCOUNTER — Ambulatory Visit: Payer: Medicare HMO | Admitting: Interventional Cardiology

## 2020-10-30 ENCOUNTER — Other Ambulatory Visit: Payer: Self-pay

## 2020-10-30 VITALS — BP 118/52 | HR 52 | Ht 70.0 in | Wt 149.0 lb

## 2020-10-30 DIAGNOSIS — I25709 Atherosclerosis of coronary artery bypass graft(s), unspecified, with unspecified angina pectoris: Secondary | ICD-10-CM

## 2020-10-30 DIAGNOSIS — E0821 Diabetes mellitus due to underlying condition with diabetic nephropathy: Secondary | ICD-10-CM | POA: Diagnosis not present

## 2020-10-30 DIAGNOSIS — E785 Hyperlipidemia, unspecified: Secondary | ICD-10-CM | POA: Diagnosis not present

## 2020-10-30 DIAGNOSIS — Z794 Long term (current) use of insulin: Secondary | ICD-10-CM | POA: Diagnosis not present

## 2020-10-30 DIAGNOSIS — I10 Essential (primary) hypertension: Secondary | ICD-10-CM | POA: Diagnosis not present

## 2020-10-30 DIAGNOSIS — N184 Chronic kidney disease, stage 4 (severe): Secondary | ICD-10-CM | POA: Diagnosis not present

## 2020-10-30 DIAGNOSIS — I5042 Chronic combined systolic (congestive) and diastolic (congestive) heart failure: Secondary | ICD-10-CM

## 2020-10-30 MED ORDER — CARVEDILOL 12.5 MG PO TABS: 12.5000 mg | ORAL_TABLET | Freq: Two times a day (BID) | ORAL | 3 refills | Status: DC

## 2020-10-30 NOTE — Patient Instructions (Signed)
Medication Instructions:  1) DECREASE Carvedilol to 12.5mg  twice daily  *If you need a refill on your cardiac medications before your next appointment, please call your pharmacy*   Lab Work: None If you have labs (blood work) drawn today and your tests are completely normal, you will receive your results only by: [place] (if you have MyChart) OR A paper copy in the mail If you have any lab test that is abnormal or we need to change your treatment, we will call you to review the results.   Testing/Procedures: None   Follow-Up: At [facility], you and your health needs are our priority.  As part of our continuing mission to provide you with exceptional heart care, we have created designated Provider Care Teams.  These Care Teams include your primary Cardiologist (physician) and Advanced Practice Providers (APPs -  Physician Assistants and Nurse Practitioners) who all work together to provide you with the care you need, when you need it.  We recommend signing up for the patient portal called "MyChart".  Sign up information is provided on this After Visit Summary.  MyChart is used to connect with patients for Virtual Visits (Telemedicine).  Patients are able to view lab/test results, encounter notes, upcoming appointments, etc.  Non-urgent messages can be sent to your provider as well.   To learn more about what you can do with MyChart, go to [email or web address].    Your next appointment:   6 month(s)  The format for your next appointment:   In Person  Provider:   You may see [name], MD or one of the following Advanced Practice Providers on your designated Care Team:   Cecilie Kicks, NP   Other Instructions

## 2020-11-09 ENCOUNTER — Other Ambulatory Visit: Payer: Self-pay

## 2020-11-09 ENCOUNTER — Ambulatory Visit (INDEPENDENT_AMBULATORY_CARE_PROVIDER_SITE_OTHER): Payer: Medicare HMO

## 2020-11-09 DIAGNOSIS — E538 Deficiency of other specified B group vitamins: Secondary | ICD-10-CM | POA: Diagnosis not present

## 2020-11-09 MED ORDER — CYANOCOBALAMIN 1000 MCG/ML IJ SOLN
1000.0000 ug | Freq: Once | INTRAMUSCULAR | Status: AC
  Administered 2020-11-09: 1000 ug via INTRAMUSCULAR

## 2020-11-09 NOTE — Progress Notes (Signed)
Per orders of [name], [name], MD, injection of B12 given in   deltoid by Franco Collet. Patient tolerated injection well.   Pt also wants to know if it is ok for him to receive his Shingrix vaccine  Lab Results  Component Value Date   VITAMINB12 1,071 (H) 03/15/2018

## 2020-11-09 NOTE — Patient Instructions (Signed)
Health Maintenance Due  Topic Date Due   FOOT EXAM  08/03/2018   OPHTHALMOLOGY EXAM  10/29/2020    Depression screen [facility] 2/9 07/09/2020 11/21/2019 09/13/2018  Decreased Interest 0 0 0  Down, Depressed, Hopeless 0 0 0  PHQ - 2 Score 0 0 0  Altered sleeping 0 0 0  Tired, decreased energy 0 0 0  Change in appetite 3 0 0  Feeling bad or failure about yourself  1 0 0  Trouble concentrating 0 0 0  Moving slowly or fidgety/restless 0 0 0  Suicidal thoughts 0 0 0  PHQ-9 Score 4 0 0  Difficult doing work/chores Not difficult at all Not difficult at all -  Some recent data might be hidden

## 2020-11-11 ENCOUNTER — Telehealth: Payer: Self-pay | Admitting: Pharmacist

## 2020-11-11 NOTE — Chronic Care Management (AMB) (Signed)
Chronic Care Management Pharmacy Assistant   Name: [name]  MRN: [id number] DOB: [birth year]   Reason for Encounter: Disease State/ Diabetes Assessment Call   Conditions to be addressed/monitored: DMII   Recent office visits:  10/20/2020 [name], [name], MD (PCP) - Patient was seen for left ear pain. No medication changes. Referral ordered for ENT. No follow up visit noted.  10/09/2020 [name], [name], MD (PCP) - Patient was seen for Essential hypertension and 4 other issues. No medication changes. Follow up in 3 months.  Recent consult visits:  10/30/2020 Belva Crome, MD (Cardiology) - Patient was seen for Chronic combined systolic and diastolic heart failure and 5 other issues. No medication changes.  Follow up visit in 6 months.  Hospital visits:  None in previous 6 months  Medications: Outpatient Encounter Medications as of 11/11/2020  Medication Sig   Accu-Chek Softclix Lancets lancets CHECK BLOOD GLUCOSE once daily   acetaminophen (TYLENOL) 500 MG tablet Take 500 mg by mouth every 8 (eight) hours as needed for mild pain or headache.   Alcohol Swabs (ALCOHOL PREP) PADS USE TO TEST BLOOD GLUCOSE THREE TIMES DAILY   amLODipine (NORVASC) 10 MG tablet TAKE 1 TABLET EVERY DAY   aspirin EC 81 MG tablet Take 81 mg by mouth at bedtime.   atorvastatin (LIPITOR) 80 MG tablet TAKE 1 TABLET EVERY DAY  AT  6PM   BD PEN NEEDLE NANO U/F 32G X 4 MM MISC INJECT TWO TIMES DAILY AS NEEDED.   bisacodyl (DULCOLAX) 5 MG EC tablet Take 5 mg by mouth daily as needed for moderate constipation.   Blood Glucose Monitoring Suppl (ACCU-CHEK AVIVA PLUS) w/Device KIT USE AS DIRECTED   brimonidine (ALPHAGAN) 0.2 % ophthalmic solution Place 1 drop into both eyes 2 (two) times daily.   carvedilol (COREG) 12.5 MG tablet Take 1 tablet (12.5 mg total) by mouth 2 (two) times daily.   cloNIDine (CATAPRES) 0.1 MG tablet TAKE 1 TABLET AT BEDTIME   COLCRYS 0.6 MG tablet TAKE  1 TABLET EVERY DAY   Continuous Blood Gluc Receiver (FREESTYLE LIBRE 14 DAY READER) DEVI 1 each by Does not apply route daily.   Continuous Blood Gluc Sensor (FREESTYLE LIBRE 14 DAY SENSOR) MISC 1 each by Does not apply route daily.   dorzolamide-timolol (COSOPT) 22.3-6.8 MG/ML ophthalmic solution Place 1 drop into both eyes 2 (two) times daily.   Ferrous Sulfate (IRON) 325 (65 Fe) MG TABS Take 1 tablet by mouth in the morning, at noon, and at bedtime.   furosemide (LASIX) 80 MG tablet TAKE 1 TABLET EVERY DAY   glucose blood (ACCU-CHEK GUIDE) test strip Test once daily   hydrALAZINE (APRESOLINE) 25 MG tablet Take 1 tablet (25 mg total) by mouth 3 (three) times daily.   insulin aspart (NOVOLOG) 100 UNIT/ML FlexPen Inject 5 Units into the skin 3 (three) times daily with meals.   insulin glargine (LANTUS SOLOSTAR) 100 UNIT/ML Solostar Pen Inject 14 Units into the skin daily.   isosorbide mononitrate (IMDUR) 60 MG 24 hr tablet TAKE 2 TABLETS (120 MG TOTAL) BY MOUTH DAILY.   Lancet Devices (ACCU-CHEK SOFTCLIX) lancets USE TO TEST BLOOD GLUCOSE THREE TIMES DAILY   latanoprost (XALATAN) 0.005 % ophthalmic solution Place 1 drop into both eyes at bedtime.    nitroGLYCERIN (NITROSTAT) 0.4 MG SL tablet Place 1 tablet (0.4 mg total) under the tongue every 5 (five) minutes as needed for chest pain (3 doses MAX).   omeprazole (PRILOSEC) 20  MG capsule TAKE 1 CAPSULE EVERY DAY   OVER THE COUNTER MEDICATION Take 1 tablet by mouth daily. Prostate formula   pentoxifylline (TRENTAL) 400 MG CR tablet TAKE 1 TABLET TWICE DAILY AT 10AM AND 5PM   potassium chloride SA (KLOR-CON) 20 MEQ tablet TAKE 1 TABLET EVERY DAY   timolol (TIMOPTIC) 0.5 % ophthalmic solution Place 1 drop into both eyes in the morning and at bedtime.   No facility-administered encounter medications on file as of 11/11/2020.   Fill History: ATORVASTATIN 80 MG TABLET 10/27/2019 90   brimonidine (ALPHAGAN) ophthalmic solution 0.2 % 08/18/2020 90    carvedilol (COREG) tablet 08/10/2020 90   dorzolamide/timolol (COSOPT) ophthalmic solution 08/18/2020 90   FUROSEMIDE 80 MG TABLET 11/27/2019 90   isosorbide mononitrate (IMDUR) 24 hr tablet 08/18/2020 90   latanoprost (XALATAN) ophthalmic solution 0.005% 08/18/2020 90   omeprazole (PRILOSEC) capsule 09/02/2020 90   pentoxifylline (TRENTAL) CR tablet 400 mg 08/26/2020 90   potassium chloride SA (K-DUR,KLOR-CON) CR tablet 09/28/2020 90   amLODIpine (NORVASC) tablet 09/29/2020 90   cloNIDine (CATAPRES) tablet 08/18/2020 90   HYDRALAZINE 25 MG TABLET 11/16/2019 90   Recent Relevant Labs: Lab Results  Component Value Date/Time   HGBA1C 6.0 (A) 10/09/2020 10:25 AM   HGBA1C 5.5 07/09/2020 08:56 AM   HGBA1C 7.5 (H) 08/11/2018 03:19 PM   HGBA1C 7.0 (H) 08/02/2017 01:52 PM   MICROALBUR 30.8 (H) 08/02/2017 01:52 PM   MICROALBUR 94.9 (H) 03/28/2014 10:21 AM    Kidney Function Lab Results  Component Value Date/Time   CREATININE 3.65 (H) 08/05/2019 04:48 PM   CREATININE 3.45 (H) 07/25/2019 07:47 AM   CREATININE 3.25 (H) 06/19/2015 09:33 AM   CREATININE 3.36 (H) 06/03/2015 10:19 AM   GFR 23.08 (L) 08/02/2017 01:52 PM   GFRNONAA 14 (L) 08/05/2019 04:48 PM   GFRAA 16 (L) 08/05/2019 04:48 PM    Current antihyperglycemic regimen:  Novolog 100units/ml Inject 5 Units into the skin 3 (three) times daily with meals. Lantus Solostar 100units/ml Inject 14 Units into the skin daily  What recent interventions/DTPs have been made to improve glycemic control:  None Have there been any recent hospitalizations or ED visits since last visit with [facility]? No  Patient denies hypoglycemic symptoms, including None  Patient denies hyperglycemic symptoms, including none  How often are you checking your blood sugar? once daily  What are your blood sugars ranging? 100 - 130  During the week, how often does your blood glucose drop below 70? Never  Are you checking your feet daily/regularly?  Yes, he is checking a couple times per week.  Adherence Review: Is the patient currently on a STATIN medication? Yes Is the patient currently on ACE/ARB medication? No Does the patient have >5 day gap between last estimated fill dates? No  Notes: Spoke with patient he states he checking his blood sugars daily at various times of the day and the readings are always in good range between 100 - 130. This morning his reading was 120 fasting. Patient states he doesn't really have a taste for food however, he eats breakfast everyday, his breakfast generally is sausage, eggs with a biscuit and gravy. Patient doesn't generally eat lunch and eats dinner out most of the time, he eats what sound good, he states his choices vary.  Patient is walking daily, sometimes he goes outside however, most of his walking is around his house.  He doesn't do any other types of activities.  Medications reviewed and verified.   Care  Gaps:  AWV - [name] notified to schedule appointment. Foot exam - overdue since 08/03/2018 Ophthalmology exam -  overdue since 10/29/2020  Star Rating Drugs:  Atorvastatin 83m - [facility].   [phone]

## 2020-11-25 ENCOUNTER — Other Ambulatory Visit: Payer: Self-pay

## 2020-11-25 ENCOUNTER — Ambulatory Visit (INDEPENDENT_AMBULATORY_CARE_PROVIDER_SITE_OTHER): Payer: Medicare HMO | Admitting: Otolaryngology

## 2020-11-25 DIAGNOSIS — H6122 Impacted cerumen, left ear: Secondary | ICD-10-CM | POA: Diagnosis not present

## 2020-11-25 NOTE — Progress Notes (Signed)
HPI: [name] is a 87 y.o. male who presents is referred by his PCP for evaluation of complaints of left ear pain and something in the left ear canal.  He was supposed to get a hearing test but apparently had wax of blood in the left ear canal and could not perform a hearing test until this was cleaned..  Past Medical History:  Diagnosis Date   Arthritis    "touch in my fingers" (09/10/2014)   Atrophic gastritis    AVM (arteriovenous malformation) of duodenum, acquired    B12 deficiency anemia    "stopped taking the shots in ~ 06/2014" (09/10/2014)   Barrett's esophagus    CAD (coronary artery disease)    a. H/o MI in 1998;  b. s/p CABG 2003. c. 10/2014 NSTEMI/Cath: LM 75, LAD 147md, D1 95, D2 50, LCX 100ost/p, OM1 80, OM2 80, RCA 100p/m, RPDA fills via L->L collats, LIMA->LAD ok, VG->dRCA 100, VG->OM1->OM2 99 prox to OM1 insertion->recanalated ->Tx with heparin,  VG->D1 100-->Med Rx.   Cataract    Chronic systolic CHF (congestive heart failure) ([place]    a. EF 40-45% in 10/2014 (previously normal in 08/2014).   CKD (chronic kidney disease), stage IV (HCC)    Diabetes 1.5, managed as type 1 ([place]    Hiatal hernia 2018   endoscopy -Dr. AHavery Moros  History of stomach ulcers 2013   Hyperlipidemia    Hypertension    Hypertensive heart disease    Ischemic cardiomyopathy    a. 10/2014 Echo: EF 40-45%.   [facility]ocardial infarction ([facility]    PVD (peripheral vascular disease) ([place]    Type II diabetes mellitus ([place]    Past Surgical History:  Procedure Laterality Date   CARDIAC CATHETERIZATION  2003;    CARDIAC CATHETERIZATION N/A 11/07/2014   Left Heart Cath; HBelva Crome MD;    CATARACT EXTRACTION, BILATERAL Bilateral    CHOLECYSTECTOMY N/A 05/20/2015   Procedure: LAPAROSCOPIC CHOLECYSTECTOMY;  Surgeon: DCoralie Keens MD;  [place]  Service: General;  Laterality: N/A;   COLONOSCOPY     COLONOSCOPY N/A 05/12/2015   Procedure: COLONOSCOPY;  Surgeon: DMilus Banister MD;   [place]  Service: Endoscopy;  Laterality: N/A;   CORONARY ANGIOPLASTY  1998   /notes 07/13/2010   CORONARY ARTERY BYPASS GRAFT  2003   CABG X5   ESOPHAGOGASTRODUODENOSCOPY  06/10/2011   Procedure: ESOPHAGOGASTRODUODENOSCOPY (EGD);  Surgeon: MLadene Artist MD,FACG;  [facility]  Service: Endoscopy;  Laterality: N/A;   ESOPHAGOGASTRODUODENOSCOPY N/A 10/04/2016   Procedure: ESOPHAGOGASTRODUODENOSCOPY (EGD);  Surgeon: AManus Gunning MD;  [facility];  Service: Gastroenterology;  Laterality: N/A;   Social History   Socioeconomic History   Marital status: Widowed    Spouse name: Not on file   Number of children: Not on file   Years of education: Not on file   Highest education level: Not on file  Occupational History   Occupation: Retired  Tobacco Use   Smoking status: Former    Packs/day: 0.50    Years: 20.00    Pack years: 10.00    Types: Cigarettes   Smokeless tobacco: Never   Tobacco comments:    "quit smoking cigarettes in the 1960s   Vaping Use   Vaping Use: Never used  Substance and Sexual Activity   Alcohol use: No   Drug use: No   Sexual activity: Not Currently  Other Topics Concern   Not on file  Social History Narrative   Pt  lives alone, family nearby.   Social Determinants of Health   Financial Resource Strain: Not on file  Food Insecurity: Not on file  Transportation Needs: Not on file  Physical Activity: Not on file  Stress: Not on file  Social Connections: Not on file   Family History  Problem Relation Age of Onset   Stroke Mother    Hypertension Mother    Cancer Mother    Diabetes Other        brothers and sisters   Coronary artery disease Other    Hypertension Sister    Heart attack Sister    Colon cancer Neg Hx    No Known Allergies Prior to Admission medications   Medication Sig Start Date End Date Taking? Authorizing Provider  Accu-Chek Softclix Lancets lancets CHECK BLOOD GLUCOSE once daily  12/30/19   [name], [name], MD  acetaminophen (TYLENOL) 500 MG tablet Take 500 mg by mouth every 8 (eight) hours as needed for mild pain or headache.    [provider]  Alcohol Swabs (ALCOHOL PREP) PADS USE TO TEST BLOOD GLUCOSE THREE TIMES DAILY 03/09/17   Marletta Lor, MD  amLODipine (NORVASC) 10 MG tablet TAKE 1 TABLET EVERY DAY 05/07/20   [name], [name], MD  aspirin EC 81 MG tablet Take 81 mg by mouth at bedtime.    [provider]  atorvastatin (LIPITOR) 80 MG tablet TAKE 1 TABLET EVERY DAY  AT  [facility] 08/05/20   Belva Crome, MD  BD PEN NEEDLE NANO U/F 32G X 4 MM MISC INJECT TWO TIMES DAILY AS NEEDED. 08/23/17   Marletta Lor, MD  bisacodyl (DULCOLAX) 5 MG EC tablet Take 5 mg by mouth daily as needed for moderate constipation.    [provider]  Blood Glucose Monitoring Suppl (ACCU-CHEK AVIVA PLUS) w/Device KIT USE AS DIRECTED 12/27/19   [name], [name], MD  brimonidine (ALPHAGAN) 0.2 % ophthalmic solution Place 1 drop into both eyes 2 (two) times daily. 10/26/20   [provider]  carvedilol (COREG) 12.5 MG tablet Take 1 tablet (12.5 mg total) by mouth 2 (two) times daily. 10/30/20   Belva Crome, MD  cloNIDine (CATAPRES) 0.1 MG tablet TAKE 1 TABLET AT BEDTIME 10/27/20   [name], [name], MD  COLCRYS 0.6 MG tablet TAKE 1 TABLET EVERY DAY 04/11/19   [name], [name], MD  Continuous Blood Gluc Receiver (FREESTYLE LIBRE 14 DAY READER) DEVI 1 each by Does not apply route daily. 07/09/20   [name], [name], MD  Continuous Blood Gluc Sensor (FREESTYLE LIBRE 14 DAY SENSOR) MISC 1 each by Does not apply route daily. 07/09/20   [name], [name], MD  dorzolamide-timolol (COSOPT) 22.3-6.8 MG/ML ophthalmic solution Place 1 drop into both eyes 2 (two) times daily.    [provider]  Ferrous Sulfate (IRON) 325 (65 Fe) MG TABS Take 1 tablet by mouth in the morning, at noon, and at  bedtime.    [provider]  furosemide (LASIX) 80 MG tablet TAKE 1 TABLET EVERY DAY 06/18/20   Belva Crome, MD  glucose blood (ACCU-CHEK GUIDE) test strip Test once daily 12/30/19   [name], [name], MD  hydrALAZINE (APRESOLINE) 25 MG tablet Take 1 tablet (25 mg total) by mouth 3 (three) times daily. 08/21/20   Belva Crome, MD  insulin aspart (NOVOLOG) 100 UNIT/ML FlexPen Inject 5 Units into the skin 3 (three) times daily with meals.  [provider]  insulin glargine (LANTUS SOLOSTAR) 100 UNIT/ML Solostar Pen Inject 14 Units into the skin daily.    [provider]  isosorbide mononitrate (IMDUR) 60 MG 24 hr tablet TAKE 2 TABLETS (120 MG TOTAL) BY MOUTH DAILY. 10/27/20   Belva Crome, MD  Lancet Devices [facility]) lancets USE TO TEST BLOOD GLUCOSE THREE TIMES DAILY 03/09/17   Marletta Lor, MD  latanoprost (XALATAN) 0.005 % ophthalmic solution Place 1 drop into both eyes at bedtime.  08/19/17   [provider]  nitroGLYCERIN (NITROSTAT) 0.4 MG SL tablet Place 1 tablet (0.4 mg total) under the tongue every 5 (five) minutes as needed for chest pain (3 doses MAX). 04/04/18   Belva Crome, MD  omeprazole (PRILOSEC) 20 MG capsule TAKE 1 CAPSULE EVERY DAY 06/18/20   Zehr, Janett Billow D, PA-C  OVER THE COUNTER MEDICATION Take 1 tablet by mouth daily. Prostate formula    [provider]  pentoxifylline (TRENTAL) 400 MG CR tablet TAKE 1 TABLET TWICE DAILY AT 10AM AND 5PM 08/25/20   [name], [name], MD  potassium chloride SA (KLOR-CON) 20 MEQ tablet TAKE 1 TABLET EVERY DAY 09/21/20   Belva Crome, MD  timolol (TIMOPTIC) 0.5 % ophthalmic solution Place 1 drop into both eyes in the morning and at bedtime. 12/25/19   [provider]     Positive ROS: Otherwise negative  All other systems have been reviewed and were otherwise negative with the exception of those mentioned in the HPI and as above.  Physical  Exam: Constitutional: Alert, well-appearing, no acute distress Ears: External ears without lesions or tenderness.  Right ear canal with minimal cerumen that was removed ear canal and TM are otherwise clear.  On the left side he has some cerumen and blood inferiorly along the ear canal that extended down to the TM.  This was gently removed.  He had what appeared to be a light abrasion inferiorly along the ear canal.  After removing the blood and wax from the left ear canal there was no further bleeding and the TM itself was clear.  The TM was intact. Nasal: External nose without lesions. Septum with minimal deformity and mild rhinitis.. Clear nasal passages Oral: Lips and gums without lesions. Tongue and palate mucosa without lesions. Posterior oropharynx clear. Neck: No palpable adenopathy or masses Respiratory: Breathing comfortably  Skin: No facial/neck lesions or rash noted.  Cerumen impaction removal  Date/Time: 11/25/2020 12:08 PM Performed by: Rozetta Nunnery, MD Authorized by: Rozetta Nunnery, MD   Consent:    Consent obtained:  Verbal   Consent given by:  Patient   Risks discussed:  Pain and bleeding Procedure details:    Location:  L ear and R ear   Procedure type: curette, suction and forceps   Post-procedure details:    Inspection:  TM intact and canal normal   Hearing quality:  Improved   Procedure completion:  Tolerated well, no immediate complications Comments:     The blood and wax in the left ear canal was removed with suction and forceps.  The TM was clear and intact.  Assessment: Dried blood and wax in the left ear canal was cleaned in the office.  No signs of infection.  TM was otherwise clear.  Plan: He is cleared to get a hearing test which he was planning on having performed prior to referral here.   Radene Journey, MD   CC:

## 2020-12-09 ENCOUNTER — Other Ambulatory Visit: Payer: Self-pay

## 2020-12-09 ENCOUNTER — Telehealth: Payer: Self-pay | Admitting: Internal Medicine

## 2020-12-09 ENCOUNTER — Ambulatory Visit (INDEPENDENT_AMBULATORY_CARE_PROVIDER_SITE_OTHER): Payer: Medicare HMO

## 2020-12-09 DIAGNOSIS — E538 Deficiency of other specified B group vitamins: Secondary | ICD-10-CM

## 2020-12-09 MED ORDER — CYANOCOBALAMIN 1000 MCG/ML IJ SOLN
1000.0000 ug | Freq: Once | INTRAMUSCULAR | Status: AC
  Administered 2020-12-09: 1000 ug via INTRAMUSCULAR

## 2020-12-09 NOTE — Telephone Encounter (Signed)
Patient wants to know if he is due anymore shots.

## 2020-12-09 NOTE — Progress Notes (Signed)
Per orders of Dr. Fry, injection of Cyanocobalamin 1000 mcg given by Brentlee Delage L Lorilynn Lehr. °Patient tolerated injection well.  °

## 2020-12-10 ENCOUNTER — Other Ambulatory Visit: Payer: Self-pay | Admitting: Internal Medicine

## 2020-12-14 ENCOUNTER — Telehealth: Payer: Self-pay | Admitting: Pharmacist

## 2020-12-14 NOTE — Chronic Care Management (AMB) (Signed)
    Chronic Care Management Pharmacy Assistant   Name: DELVECCHIO MADOLE  MRN: [id number] DOB: February 28, 1932   Reason for Encounter: Reschedule appointment. Patients appointment rescheduled to 04/02/2021 at 11:15. Patient aware.    Care Gaps: AWV - [name] notified to schedule appointment. Foot exam - overdue since 08/03/2018 Ophthalmology exam -  overdue since 10/29/2020  Star Rating Drugs: Atorvastatin 80mg  - last filled 10/17/2020 90DS at [facility] spoke with [place] at [facility] to verify.  [place] Pharmacist Assistant [phone]

## 2020-12-31 DIAGNOSIS — H401122 Primary open-angle glaucoma, left eye, moderate stage: Secondary | ICD-10-CM | POA: Diagnosis not present

## 2020-12-31 DIAGNOSIS — H401113 Primary open-angle glaucoma, right eye, severe stage: Secondary | ICD-10-CM | POA: Diagnosis not present

## 2021-01-11 ENCOUNTER — Ambulatory Visit: Payer: Medicare HMO

## 2021-01-13 ENCOUNTER — Ambulatory Visit (INDEPENDENT_AMBULATORY_CARE_PROVIDER_SITE_OTHER): Payer: Medicare HMO | Admitting: Internal Medicine

## 2021-01-13 ENCOUNTER — Encounter: Payer: Self-pay | Admitting: Internal Medicine

## 2021-01-13 VITALS — BP 120/68 | HR 56 | Temp 97.8°F | Wt 142.7 lb

## 2021-01-13 DIAGNOSIS — I5042 Chronic combined systolic (congestive) and diastolic (congestive) heart failure: Secondary | ICD-10-CM | POA: Diagnosis not present

## 2021-01-13 DIAGNOSIS — I10 Essential (primary) hypertension: Secondary | ICD-10-CM | POA: Diagnosis not present

## 2021-01-13 DIAGNOSIS — E785 Hyperlipidemia, unspecified: Secondary | ICD-10-CM | POA: Diagnosis not present

## 2021-01-13 DIAGNOSIS — N184 Chronic kidney disease, stage 4 (severe): Secondary | ICD-10-CM | POA: Diagnosis not present

## 2021-01-13 DIAGNOSIS — E119 Type 2 diabetes mellitus without complications: Secondary | ICD-10-CM

## 2021-01-13 DIAGNOSIS — N185 Chronic kidney disease, stage 5: Secondary | ICD-10-CM

## 2021-01-13 DIAGNOSIS — Z794 Long term (current) use of insulin: Secondary | ICD-10-CM | POA: Diagnosis not present

## 2021-01-13 DIAGNOSIS — E538 Deficiency of other specified B group vitamins: Secondary | ICD-10-CM

## 2021-01-13 DIAGNOSIS — E1122 Type 2 diabetes mellitus with diabetic chronic kidney disease: Secondary | ICD-10-CM | POA: Diagnosis not present

## 2021-01-13 LAB — POCT GLYCOSYLATED HEMOGLOBIN (HGB A1C): Hemoglobin A1C: 6.5 % — AB (ref 4.0–5.6)

## 2021-01-13 MED ORDER — CYANOCOBALAMIN 1000 MCG/ML IJ SOLN
1000.0000 ug | Freq: Once | INTRAMUSCULAR | Status: AC
  Administered 2021-01-13: 1000 ug via INTRAMUSCULAR

## 2021-01-13 NOTE — Progress Notes (Signed)
Established Patient Office Visit     This visit occurred during the SARS-CoV-2 public health emergency.  Safety protocols were in place, including screening questions prior to the visit, additional usage of staff PPE, and extensive cleaning of exam room while observing appropriate contact time as indicated for disinfecting solutions.    CC/Reason for Visit: 72-monthfollow-up chronic medical conditions  HPI: Brandon KORTEis a 87y.o. male who is coming in today for the above mentioned reasons. Past Medical History is significant for: Chronic combined heart failure with a known ejection fraction of 30 to 35% and grade 2 diastolic dysfunction, hypertension that has been well controlled in the past, coronary artery disease, diabetes which is insulin-dependent,, chronic kidney disease stage V, B12 deficiency.  He is doing well and has no acute concerns or complaints today.  He is requesting B12 injection today.  He is overdue for CPE/Medicare wellness visit.   Past Medical/Surgical History: Past Medical History:  Diagnosis Date   Arthritis    "touch in my fingers" (09/10/2014)   Atrophic gastritis    AVM (arteriovenous malformation) of duodenum, acquired    B12 deficiency anemia    "stopped taking the shots in ~ 06/2014" (09/10/2014)   Barrett's esophagus    CAD (coronary artery disease)    a. H/o MI in 1998;  b. s/p CABG 2003. c. 10/2014 NSTEMI/Cath: LM 75, LAD 1064m, D1 95, D2 50, LCX 100ost/p, OM1 80, OM2 80, RCA 100p/m, RPDA fills via L->L collats, LIMA->LAD ok, VG->dRCA 100, VG->OM1->OM2 99 prox to OM1 insertion->recanalated ->Tx with heparin,  VG->D1 100-->Med Rx.   Cataract    Chronic systolic CHF (congestive heart failure) ([place]   a. EF 40-45% in 10/2014 (previously normal in 08/2014).   CKD (chronic kidney disease), stage IV (HCC)    Diabetes 1.5, managed as type 1 ([place]   Hiatal hernia 2018   endoscopy -Dr. ArHavery Moros History of stomach ulcers 2013   Hyperlipidemia     Hypertension    Hypertensive heart disease    Ischemic cardiomyopathy    a. 10/2014 Echo: EF 40-45%.   [facility]   PVD (peripheral vascular disease) ([facility]   Type II diabetes mellitus ([place]    Past Surgical History:  Procedure Laterality Date   CARDIAC CATHETERIZATION  2003;    CARDIAC CATHETERIZATION N/A 11/07/2014   Left Heart Cath; HeBelva CromeMD;    CATARACT EXTRACTION, BILATERAL Bilateral    CHOLECYSTECTOMY N/A 05/20/2015   Procedure: LAPAROSCOPIC CHOLECYSTECTOMY;  Surgeon: DoCoralie KeensMD;  [place]: [facility] Service: General;  Laterality: N/A;   COLONOSCOPY     COLONOSCOPY N/A 05/12/2015   Procedure: COLONOSCOPY;  Surgeon: DaMilus BanisterMD;  [place] [facility] Service: Endoscopy;  Laterality: N/A;   CORONARY ANGIOPLASTY  1998   /notes 07/13/2010   CORONARY ARTERY BYPASS GRAFT  2003   CABG X5   ESOPHAGOGASTRODUODENOSCOPY  06/10/2011   Procedure: ESOPHAGOGASTRODUODENOSCOPY (EGD);  Surgeon: MaLadene ArtistMD,FACG;  [facility];  Service: Endoscopy;  Laterality: N/A;   ESOPHAGOGASTRODUODENOSCOPY N/A 10/04/2016   Procedure: ESOPHAGOGASTRODUODENOSCOPY (EGD);  Surgeon: ArManus GunningMD;  [facility];  Service: Gastroenterology;  Laterality: N/A;    Social History:  reports that he has quit smoking. His smoking use included cigarettes. He has a 10.00 pack-year smoking history. He has never used smokeless tobacco. He reports that he does not drink alcohol and does not use drugs.  Allergies: No Known Allergies  Family History:  Family History  Problem Relation Age of Onset   Stroke Mother    Hypertension Mother    Cancer Mother    Diabetes Other        brothers and sisters   Coronary artery disease Other    Hypertension Sister    Heart attack Sister    Colon cancer Neg Hx      Current Outpatient Medications:    Accu-Chek Softclix Lancets lancets, CHECK BLOOD GLUCOSE once daily, Disp: 100 each, Rfl: 12    acetaminophen (TYLENOL) 500 MG tablet, Take 500 mg by mouth every 8 (eight) hours as needed for mild pain or headache., Disp: , Rfl:    Alcohol Swabs (ALCOHOL PREP) PADS, USE TO TEST BLOOD GLUCOSE THREE TIMES DAILY, Disp: 300 each, Rfl: 3   amLODipine (NORVASC) 10 MG tablet, TAKE 1 TABLET EVERY DAY, Disp: 90 tablet, Rfl: 1   aspirin EC 81 MG tablet, Take 81 mg by mouth at bedtime., Disp: , Rfl:    atorvastatin (LIPITOR) 80 MG tablet, TAKE 1 TABLET EVERY DAY  AT  6PM, Disp: 90 tablet, Rfl: 2   BD PEN NEEDLE NANO U/F 32G X 4 MM MISC, INJECT TWO TIMES DAILY AS NEEDED., Disp: 180 each, Rfl: 3   bisacodyl (DULCOLAX) 5 MG EC tablet, Take 5 mg by mouth daily as needed for moderate constipation., Disp: , Rfl:    Blood Glucose Monitoring Suppl (ACCU-CHEK AVIVA PLUS) w/Device KIT, USE AS DIRECTED, Disp: 1 kit, Rfl: 0   brimonidine (ALPHAGAN) 0.2 % ophthalmic solution, Place 1 drop into both eyes 2 (two) times daily., Disp: , Rfl:    carvedilol (COREG) 12.5 MG tablet, Take 1 tablet (12.5 mg total) by mouth 2 (two) times daily., Disp: 180 tablet, Rfl: 3   cloNIDine (CATAPRES) 0.1 MG tablet, TAKE 1 TABLET AT BEDTIME, Disp: 90 tablet, Rfl: 1   COLCRYS 0.6 MG tablet, TAKE 1 TABLET EVERY DAY, Disp: 90 tablet, Rfl: 1   Continuous Blood Gluc Receiver (FREESTYLE LIBRE 14 DAY READER) DEVI, 1 each by Does not apply route daily., Disp: 1 each, Rfl: 2   Continuous Blood Gluc Sensor (FREESTYLE LIBRE 14 DAY SENSOR) MISC, 1 each by Does not apply route daily., Disp: 2 each, Rfl: 3   dorzolamide-timolol (COSOPT) 22.3-6.8 MG/ML ophthalmic solution, Place 1 drop into both eyes 2 (two) times daily., Disp: , Rfl:    Ferrous Sulfate (IRON) 325 (65 Fe) MG TABS, Take 1 tablet by mouth in the morning, at noon, and at bedtime., Disp: , Rfl:    furosemide (LASIX) 80 MG tablet, TAKE 1 TABLET EVERY DAY, Disp: 90 tablet, Rfl: 3   glucose blood (ACCU-CHEK GUIDE) test strip, Test once daily, Disp: 100 each, Rfl: 12   hydrALAZINE  (APRESOLINE) 25 MG tablet, Take 1 tablet (25 mg total) by mouth 3 (three) times daily., Disp: 270 tablet, Rfl: 1   insulin aspart (NOVOLOG) 100 UNIT/ML FlexPen, Inject 5 Units into the skin 3 (three) times daily with meals., Disp: , Rfl:    insulin glargine (LANTUS SOLOSTAR) 100 UNIT/ML Solostar Pen, Inject 14 Units into the skin daily., Disp: , Rfl:    isosorbide mononitrate (IMDUR) 60 MG 24 hr tablet, TAKE 2 TABLETS (120 MG TOTAL) BY MOUTH DAILY., Disp: 180 tablet, Rfl: 1   Lancet Devices (ACCU-CHEK SOFTCLIX) lancets, USE TO TEST BLOOD GLUCOSE THREE TIMES DAILY, Disp: 1 each, Rfl: 0   latanoprost (XALATAN) 0.005 % ophthalmic solution, Place 1 drop into  both eyes at bedtime. , Disp: , Rfl:    nitroGLYCERIN (NITROSTAT) 0.4 MG SL tablet, Place 1 tablet (0.4 mg total) under the tongue every 5 (five) minutes as needed for chest pain (3 doses MAX)., Disp: 75 tablet, Rfl: 1   omeprazole (PRILOSEC) 20 MG capsule, TAKE 1 CAPSULE EVERY DAY, Disp: 90 capsule, Rfl: 4   OVER THE COUNTER MEDICATION, Take 1 tablet by mouth daily. Prostate formula, Disp: , Rfl:    pentoxifylline (TRENTAL) 400 MG CR tablet, TAKE 1 TABLET TWICE DAILY AT 10AM AND 5PM, Disp: 180 tablet, Rfl: 1   potassium chloride SA (KLOR-CON) 20 MEQ tablet, TAKE 1 TABLET EVERY DAY, Disp: 90 tablet, Rfl: 0   timolol (TIMOPTIC) 0.5 % ophthalmic solution, Place 1 drop into both eyes in the morning and at bedtime., Disp: , Rfl:   Review of Systems:  Constitutional: Denies fever, chills, diaphoresis, appetite change and fatigue.  HEENT: Denies photophobia, eye pain, redness, hearing loss, ear pain, congestion, sore throat, rhinorrhea, sneezing, mouth sores, trouble swallowing, neck pain, neck stiffness and tinnitus.   Respiratory: Denies SOB, DOE, cough, chest tightness,  and wheezing.   Cardiovascular: Denies chest pain, palpitations and leg swelling.  Gastrointestinal: Denies nausea, vomiting, abdominal pain, diarrhea, constipation, blood in stool  and abdominal distention.  Genitourinary: Denies dysuria, urgency, frequency, hematuria, flank pain and difficulty urinating.  Endocrine: Denies: hot or cold intolerance, sweats, changes in hair or nails, polyuria, polydipsia. Musculoskeletal: Denies myalgias, back pain, joint swelling, arthralgias and gait problem.  Skin: Denies pallor, rash and wound.  Neurological: Denies dizziness, seizures, syncope, weakness, light-headedness, numbness and headaches.  Hematological: Denies adenopathy. Easy bruising, personal or family bleeding history  Psychiatric/Behavioral: Denies suicidal ideation, mood changes, confusion, nervousness, sleep disturbance and agitation    Physical Exam: Vitals:   01/13/21 1048  BP: 120/68  Pulse: (!) 56  Temp: 97.8 F (36.6 C)  TempSrc: Oral  SpO2: 98%  Weight: 142 lb 11.2 oz (64.7 kg)    Body mass index is 20.48 kg/m.   Constitutional: NAD, calm, comfortable Eyes: PERRL, lids and conjunctivae normal ENMT: Mucous membranes are moist.  Respiratory: clear to auscultation bilaterally, no wheezing, no crackles. Normal respiratory effort. No accessory muscle use.  Cardiovascular: Regular rate and rhythm, no murmurs / rubs / gallops. No extremity edema.  Neurologic: Grossly intact and nonfocal Psychiatric: Normal judgment and insight. Alert and oriented x 3. Normal mood.    Impression and Plan:  Type 2 diabetes mellitus with stage 4 chronic kidney disease, with long-term current use of insulin ([place])  - Plan: POCT glycosylated hemoglobin (Hb A1C) -In office A1c demonstrates good control at 6.5.  CKD (chronic kidney disease) stage 5, GFR less than 15 ml/min (HCC) -Advanced CKD, followed by nephrology.  Essential hypertension -Well-controlled.  Dyslipidemia -Lipids at goal, due for recheck.  Diabetes mellitus type 2, insulin dependent (HCC) -A1c is at goal at 6.5.  B12 deficiency -B12 IM today.  Chronic combined systolic and diastolic heart  failure (HCC) -Stable, compensated, continue carvedilol, Imdur, atorvastatin, Lasix.  Not on ARB/ACE inhibitor due to advanced renal dysfunction.  Time spent: 33 minutes reviewing chart, interviewing and examining patient and formulating plan of care.   Patient Instructions  -Nice seeing you today!!  -B12 injection today.  -Schedule follow up in 3 months for your physical. Please come in fasting that day.    Lelon Frohlich, MD [facility] at [facility]

## 2021-01-13 NOTE — Addendum Note (Signed)
Addended by: Westley Hummer B on: 01/13/2021 11:21 AM   Modules accepted: Orders

## 2021-01-13 NOTE — Patient Instructions (Signed)
-  Nice seeing you today!!  -B12 injection today.  -Schedule follow up in 3 months for your physical. Please come in fasting that day.

## 2021-01-15 ENCOUNTER — Other Ambulatory Visit: Payer: Self-pay | Admitting: Internal Medicine

## 2021-02-04 ENCOUNTER — Telehealth: Payer: Self-pay | Admitting: Pharmacist

## 2021-02-04 NOTE — Chronic Care Management (AMB) (Signed)
Chronic Care Management Pharmacy Assistant   Name: [name]  MRN: [id number] DOB: 04-Nov-1931  Reason for Encounter: Disease State / Diabetes Assessment Call   Conditions to be addressed/monitored: DMII  Recent office visits:  01/13/2021 Brandon Frohlich MD - Patient was seen for Type 2 diabetes and additional issues. No medication changes. Follow up in 3 months.  Recent consult visits:  12/31/2020 Brandon Mutter MD (opthalmology) - Patient was seen for glaucoma evaluation. No medication changes. Follow up in 6 months.  Hospital visits:  None  Medications: Outpatient Encounter Medications as of 02/04/2021  Medication Sig   Accu-Chek Softclix Lancets lancets TEST BLOOD SUGAR EVERY DAY   acetaminophen (TYLENOL) 500 MG tablet Take 500 mg by mouth every 8 (eight) hours as needed for mild pain or headache.   Alcohol Swabs (ALCOHOL PREP) PADS USE TO TEST BLOOD GLUCOSE THREE TIMES DAILY   amLODipine (NORVASC) 10 MG tablet TAKE 1 TABLET EVERY DAY   aspirin EC 81 MG tablet Take 81 mg by mouth at bedtime.   atorvastatin (LIPITOR) 80 MG tablet TAKE 1 TABLET EVERY DAY  AT  6PM   BD PEN NEEDLE NANO U/F 32G X 4 MM MISC INJECT TWO TIMES DAILY AS NEEDED.   bisacodyl (DULCOLAX) 5 MG EC tablet Take 5 mg by mouth daily as needed for moderate constipation.   Blood Glucose Monitoring Suppl (ACCU-CHEK AVIVA PLUS) w/Device KIT USE AS DIRECTED   brimonidine (ALPHAGAN) 0.2 % ophthalmic solution Place 1 drop into both eyes 2 (two) times daily.   carvedilol (COREG) 12.5 MG tablet Take 1 tablet (12.5 mg total) by mouth 2 (two) times daily.   cloNIDine (CATAPRES) 0.1 MG tablet TAKE 1 TABLET AT BEDTIME   COLCRYS 0.6 MG tablet TAKE 1 TABLET EVERY DAY   Continuous Blood Gluc Receiver (FREESTYLE LIBRE 14 DAY READER) DEVI 1 each by Does not apply route daily.   Continuous Blood Gluc Sensor (FREESTYLE LIBRE 14 DAY SENSOR) MISC 1 each by Does not apply route daily.   dorzolamide-timolol  (COSOPT) 22.3-6.8 MG/ML ophthalmic solution Place 1 drop into both eyes 2 (two) times daily.   Ferrous Sulfate (IRON) 325 (65 Fe) MG TABS Take 1 tablet by mouth in the morning, at noon, and at bedtime.   furosemide (LASIX) 80 MG tablet TAKE 1 TABLET EVERY DAY   glucose blood (ACCU-CHEK GUIDE) test strip Test once daily   hydrALAZINE (APRESOLINE) 25 MG tablet Take 1 tablet (25 mg total) by mouth 3 (three) times daily.   insulin aspart (NOVOLOG) 100 UNIT/ML FlexPen Inject 5 Units into the skin 3 (three) times daily with meals.   insulin glargine (LANTUS SOLOSTAR) 100 UNIT/ML Solostar Pen Inject 14 Units into the skin daily.   isosorbide mononitrate (IMDUR) 60 MG 24 hr tablet TAKE 2 TABLETS (120 MG TOTAL) BY MOUTH DAILY.   Lancet Devices (ACCU-CHEK SOFTCLIX) lancets USE TO TEST BLOOD GLUCOSE THREE TIMES DAILY   latanoprost (XALATAN) 0.005 % ophthalmic solution Place 1 drop into both eyes at bedtime.    nitroGLYCERIN (NITROSTAT) 0.4 MG SL tablet Place 1 tablet (0.4 mg total) under the tongue every 5 (five) minutes as needed for chest pain (3 doses MAX).   omeprazole (PRILOSEC) 20 MG capsule TAKE 1 CAPSULE EVERY DAY   OVER THE COUNTER MEDICATION Take 1 tablet by mouth daily. Prostate formula   pentoxifylline (TRENTAL) 400 MG CR tablet TAKE 1 TABLET TWICE DAILY AT 10AM AND 5PM   potassium chloride SA (KLOR-CON) 20 MEQ tablet  TAKE 1 TABLET EVERY DAY   timolol (TIMOPTIC) 0.5 % ophthalmic solution Place 1 drop into both eyes in the morning and at bedtime.   No facility-administered encounter medications on file as of 02/04/2021.  Fill History: atorvastatin (LIPITOR) tablet 10/17/2020 90   carvedilol (COREG) tablet 11/03/2020 90   dorzolamide/timolol (COSOPT) ophthalmic solution 10/20/2020 90   furosemide (LASIX) tablet 11/14/2020 90   isosorbide mononitrate (IMDUR) 24 hr tablet 10/27/2020 90   latanoprost (XALATAN) ophthalmic solution 0.005% 10/27/2020 90   omeprazole (PRILOSEC) capsule  11/14/2020 90   pentoxifylline (TRENTAL) CR tablet 400 mg 11/07/2020 90   potassium chloride SA (K-DUR,KLOR-CON) CR tablet 12/10/2020 90   amLODIpine (NORVASC) tablet 12/10/2020 90   cloNIDine (CATAPRES) tablet 10/27/2020 90   hydrALAZINE (APRESOLINE) tablet 11/07/2020 90  Recent Relevant Labs: Lab Results  Component Value Date/Time   HGBA1C 6.5 (A) 01/13/2021 10:52 AM   HGBA1C 6.0 (A) 10/09/2020 10:25 AM   HGBA1C 7.5 (H) 08/11/2018 03:19 PM   HGBA1C 7.0 (H) 08/02/2017 01:52 PM   MICROALBUR 30.8 (H) 08/02/2017 01:52 PM   MICROALBUR 94.9 (H) 03/28/2014 10:21 AM    Kidney Function Lab Results  Component Value Date/Time   CREATININE 3.65 (H) 08/05/2019 04:48 PM   CREATININE 3.45 (H) 07/25/2019 07:47 AM   CREATININE 3.25 (H) 06/19/2015 09:33 AM   CREATININE 3.36 (H) 06/03/2015 10:19 AM   GFR 23.08 (L) 08/02/2017 01:52 PM   GFRNONAA 14 (L) 08/05/2019 04:48 PM   GFRAA 16 (L) 08/05/2019 04:48 PM    Current antihyperglycemic regimen:  NOVOLOG 100 UNIT/ML FlexPen Inject 5 Units into the skin 3 (three) times daily with meals LANTUS SOLOSTAR 100 UNIT/ML Inject 14 Units into the skin daily  What recent interventions/DTPs have been made to improve glycemic control:  None  Have there been any recent hospitalizations or ED visits since last visit with [facility]? No  Patient denies hypoglycemic symptoms, including None  Patient denies hyperglycemic symptoms, including none  How often are you checking your blood sugar? once daily  What are your blood sugars ranging?  After meals: Patient states his BS have been running between 110/115  During the week, how often does your blood glucose drop below 70? Never  Are you checking your feet daily/regularly? Yes  Adherence Review: Is the patient currently on a STATIN medication? Yes Is the patient currently on ACE/ARB medication? No Does the patient have >5 day gap between last estimated fill dates? Yes  Care Gaps: AWV - [name]  notified to schedule appointment. Foot exam - overdue  Ophthalmology exam - overdue  Last BP - 120/68 on 01/13/2021 Last A1C - 6.5 on 01/13/2021  Star Rating Drugs: Atorvastatin 71m - [facility]  JTiger PointPharmacist Assistant [phone]

## 2021-02-12 ENCOUNTER — Ambulatory Visit (INDEPENDENT_AMBULATORY_CARE_PROVIDER_SITE_OTHER): Payer: Medicare HMO

## 2021-02-12 DIAGNOSIS — E538 Deficiency of other specified B group vitamins: Secondary | ICD-10-CM

## 2021-02-12 MED ORDER — CYANOCOBALAMIN 1000 MCG/ML IJ SOLN
1000.0000 ug | Freq: Once | INTRAMUSCULAR | Status: AC
  Administered 2021-02-12: 1000 ug via INTRAMUSCULAR

## 2021-02-12 NOTE — Progress Notes (Signed)
Per orders of Dr. Hernandez, injection of Cyanocobalamin 1000 mcg given by Adlyn Fife L Ariabella Brien. Patient tolerated injection well.  

## 2021-02-15 DIAGNOSIS — Z961 Presence of intraocular lens: Secondary | ICD-10-CM | POA: Diagnosis not present

## 2021-02-15 DIAGNOSIS — E119 Type 2 diabetes mellitus without complications: Secondary | ICD-10-CM | POA: Diagnosis not present

## 2021-02-15 DIAGNOSIS — H52203 Unspecified astigmatism, bilateral: Secondary | ICD-10-CM | POA: Diagnosis not present

## 2021-02-15 DIAGNOSIS — H3500 Unspecified background retinopathy: Secondary | ICD-10-CM | POA: Diagnosis not present

## 2021-02-15 LAB — HM DIABETES EYE EXAM

## 2021-02-16 ENCOUNTER — Encounter: Payer: Self-pay | Admitting: Internal Medicine

## 2021-02-16 ENCOUNTER — Other Ambulatory Visit: Payer: Self-pay | Admitting: Internal Medicine

## 2021-02-22 ENCOUNTER — Other Ambulatory Visit: Payer: Self-pay | Admitting: Interventional Cardiology

## 2021-03-05 DIAGNOSIS — D631 Anemia in chronic kidney disease: Secondary | ICD-10-CM | POA: Diagnosis not present

## 2021-03-05 DIAGNOSIS — E611 Iron deficiency: Secondary | ICD-10-CM | POA: Diagnosis not present

## 2021-03-05 DIAGNOSIS — N185 Chronic kidney disease, stage 5: Secondary | ICD-10-CM | POA: Diagnosis not present

## 2021-03-05 DIAGNOSIS — N2581 Secondary hyperparathyroidism of renal origin: Secondary | ICD-10-CM | POA: Diagnosis not present

## 2021-03-05 DIAGNOSIS — I120 Hypertensive chronic kidney disease with stage 5 chronic kidney disease or end stage renal disease: Secondary | ICD-10-CM | POA: Diagnosis not present

## 2021-03-05 LAB — BASIC METABOLIC PANEL
Chloride: 111 — AB (ref 99–108)
Creatinine: 3.5 — AB (ref 0.6–1.3)
Glucose: 186
Potassium: 4.9 (ref 3.4–5.3)
Sodium: 141 (ref 137–147)

## 2021-03-05 LAB — COMPREHENSIVE METABOLIC PANEL
Albumin: 4 (ref 3.5–5.0)
Calcium: 9.4 (ref 8.7–10.7)
GFR calc Af Amer: 16

## 2021-03-05 LAB — CBC AND DIFFERENTIAL: Hemoglobin: 10.1 — AB (ref 13.5–17.5)

## 2021-03-15 ENCOUNTER — Ambulatory Visit (INDEPENDENT_AMBULATORY_CARE_PROVIDER_SITE_OTHER): Payer: Medicare HMO | Admitting: *Deleted

## 2021-03-15 DIAGNOSIS — E538 Deficiency of other specified B group vitamins: Secondary | ICD-10-CM

## 2021-03-15 MED ORDER — CYANOCOBALAMIN 1000 MCG/ML IJ SOLN
1000.0000 ug | Freq: Once | INTRAMUSCULAR | Status: AC
  Administered 2021-03-15: 1000 ug via INTRAMUSCULAR

## 2021-03-15 NOTE — Progress Notes (Signed)
Per orders of Dr. Hernandez, injection of Cyanocobalamin 1000mcg given by Krina Mraz A. Patient tolerated injection well. 

## 2021-03-18 ENCOUNTER — Telehealth: Payer: Medicare HMO

## 2021-03-19 ENCOUNTER — Encounter: Payer: Self-pay | Admitting: Internal Medicine

## 2021-03-24 ENCOUNTER — Ambulatory Visit (INDEPENDENT_AMBULATORY_CARE_PROVIDER_SITE_OTHER): Payer: Medicare HMO

## 2021-03-24 ENCOUNTER — Ambulatory Visit (INDEPENDENT_AMBULATORY_CARE_PROVIDER_SITE_OTHER): Payer: Medicare HMO | Admitting: Internal Medicine

## 2021-03-24 ENCOUNTER — Other Ambulatory Visit: Payer: Self-pay

## 2021-03-24 VITALS — BP 122/56 | HR 56 | Temp 97.8°F | Wt 145.0 lb

## 2021-03-24 DIAGNOSIS — R0602 Shortness of breath: Secondary | ICD-10-CM | POA: Diagnosis not present

## 2021-03-24 DIAGNOSIS — I5043 Acute on chronic combined systolic (congestive) and diastolic (congestive) heart failure: Secondary | ICD-10-CM | POA: Diagnosis not present

## 2021-03-24 NOTE — Progress Notes (Signed)
Established Patient Office Visit     This visit occurred during the SARS-CoV-2 public health emergency.  Safety protocols were in place, including screening questions prior to the visit, additional usage of staff PPE, and extensive cleaning of exam room while observing appropriate contact time as indicated for disinfecting solutions.    CC/Reason for Visit: Fatigue and shortness of breath  HPI: [name] is a 88 y.o. male who is coming in today for the above mentioned reasons. 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  He comes in today with complaints of fatigue.  He has also been short winded and has noted some increased lower extremity edema.  He has not had chest pain, palpitations or dizziness.   Past Medical/Surgical History: Past Medical History:  Diagnosis Date   Arthritis    "touch in my fingers" (09/10/2014)   Atrophic gastritis    AVM (arteriovenous malformation) of duodenum, acquired    B12 deficiency anemia    "stopped taking the shots in ~ 06/2014" (09/10/2014)   Barrett's esophagus    CAD (coronary artery disease)    a. H/o MI in 1998;  b. s/p CABG 2003. c. 10/2014 NSTEMI/Cath: LM 75, LAD 113md, D1 95, D2 50, LCX 100ost/p, OM1 80, OM2 80, RCA 100p/m, RPDA fills via L->L collats, LIMA->LAD ok, VG->dRCA 100, VG->OM1->OM2 99 prox to OM1 insertion->recanalated ->Tx with heparin,  VG->D1 100-->Med Rx.   Cataract    Chronic systolic CHF (congestive heart failure) ([place]    a. EF 40-45% in 10/2014 (previously normal in 08/2014).   CKD (chronic kidney disease), stage IV (HCC)    Diabetes 1.5, managed as type 1 ([place]    Hiatal hernia 2018   endoscopy -Dr. AHavery Moros  History of stomach ulcers 2013   Hyperlipidemia    Hypertension    Hypertensive heart disease     Ischemic cardiomyopathy    a. 10/2014 Echo: EF 40-45%.   Myocardial infarction ([facility]    PVD (peripheral vascular disease) [place]    Type II diabetes mellitus ([place]     Past Surgical History:  Procedure Laterality Date   CARDIAC CATHETERIZATION  2003;    CARDIAC CATHETERIZATION N/A 11/07/2014   Left Heart Cath; HBelva Crome MD;    CATARACT EXTRACTION, BILATERAL Bilateral    CHOLECYSTECTOMY N/A 05/20/2015   Procedure: LAPAROSCOPIC CHOLECYSTECTOMY;  Surgeon: DCoralie Keens MD;  [place]  Service: General;  Laterality: N/A;   COLONOSCOPY     COLONOSCOPY N/A 05/12/2015   Procedure: COLONOSCOPY;  Surgeon: DMilus Banister MD;  [place] [facility]  Service: Endoscopy;  Laterality: N/A;   CORONARY ANGIOPLASTY  1998   /notes 07/13/2010   CORONARY ARTERY BYPASS GRAFT  2003   CABG X5   ESOPHAGOGASTRODUODENOSCOPY  06/10/2011   Procedure: ESOPHAGOGASTRODUODENOSCOPY (EGD);  Surgeon: MLadene Artist MD,FACG;  [facility]  Service: Endoscopy;  Laterality: N/A;   ESOPHAGOGASTRODUODENOSCOPY N/A 10/04/2016   Procedure: ESOPHAGOGASTRODUODENOSCOPY (EGD);  Surgeon: AManus Gunning MD;  [facility];  Service: Gastroenterology;  Laterality: N/A;    Social History:  reports that he has quit smoking. His smoking use included cigarettes. He has a 10.00 pack-year smoking history. He has never used smokeless tobacco. He reports that he does not drink alcohol and does not use drugs.  Allergies: No Known Allergies  Family History:  Family History  Problem Relation Age of Onset   Stroke Mother    Hypertension Mother    Cancer Mother    Diabetes Other        brothers and sisters   Coronary artery disease Other    Hypertension Sister    Heart attack Sister    Colon cancer Neg Hx      Current Outpatient Medications:    Accu-Chek Softclix Lancets lancets, TEST BLOOD SUGAR EVERY DAY, Disp: 100 each, Rfl: 12   acetaminophen (TYLENOL) 500 MG tablet, Take 500 mg by  mouth every 8 (eight) hours as needed for mild pain or headache., Disp: , Rfl:    Alcohol Swabs (ALCOHOL PREP) PADS, USE TO TEST BLOOD GLUCOSE THREE TIMES DAILY, Disp: 300 each, Rfl: 3   amLODipine (NORVASC) 10 MG tablet, TAKE 1 TABLET EVERY DAY, Disp: 90 tablet, Rfl: 1   aspirin EC 81 MG tablet, Take 81 mg by mouth at bedtime., Disp: , Rfl:    atorvastatin (LIPITOR) 80 MG tablet, TAKE 1 TABLET EVERY DAY  AT  6PM, Disp: 90 tablet, Rfl: 2   BD PEN NEEDLE NANO U/F 32G X 4 MM MISC, INJECT TWO TIMES DAILY AS NEEDED., Disp: 180 each, Rfl: 3   bisacodyl (DULCOLAX) 5 MG EC tablet, Take 5 mg by mouth daily as needed for moderate constipation., Disp: , Rfl:    Blood Glucose Monitoring Suppl (ACCU-CHEK AVIVA PLUS) w/Device KIT, USE AS DIRECTED, Disp: 1 kit, Rfl: 0   brimonidine (ALPHAGAN) 0.2 % ophthalmic solution, Place 1 drop into both eyes 2 (two) times daily., Disp: , Rfl:    carvedilol (COREG) 12.5 MG tablet, Take 1 tablet (12.5 mg total) by mouth 2 (two) times daily., Disp: 180 tablet, Rfl: 3   cloNIDine (CATAPRES) 0.1 MG tablet, TAKE 1 TABLET AT BEDTIME, Disp: 90 tablet, Rfl: 1   Continuous Blood Gluc Receiver (FREESTYLE LIBRE 14 DAY READER) DEVI, 1 each by Does not apply route daily., Disp: 1 each, Rfl: 2   Continuous Blood Gluc Sensor (FREESTYLE LIBRE 14 DAY SENSOR) MISC, 1 each by Does not apply route daily., Disp: 2 each, Rfl: 3   dorzolamide-timolol (COSOPT) 22.3-6.8 MG/ML ophthalmic solution, Place 1 drop into both eyes 2 (two) times daily., Disp: , Rfl:    Ferrous Sulfate (IRON) 325 (65 Fe) MG TABS, Take 1 tablet by mouth in the morning, at noon, and at bedtime., Disp: , Rfl:    furosemide (LASIX) 80 MG tablet, TAKE 1 TABLET EVERY DAY, Disp: 90 tablet, Rfl: 3   glucose blood (ACCU-CHEK GUIDE) test strip, TEST BLOOD SUGAR EVERY DAY, Disp: 100 strip, Rfl: 1   hydrALAZINE (APRESOLINE) 25 MG tablet, TAKE 1 TABLET (25 MG TOTAL) BY MOUTH 3 (THREE) TIMES DAILY., Disp: 270 tablet, Rfl: 1   insulin  aspart (NOVOLOG) 100 UNIT/ML FlexPen, Inject 5 Units into the skin 3 (three) times daily with meals., Disp: , Rfl:    insulin glargine (LANTUS SOLOSTAR) 100 UNIT/ML Solostar Pen, Inject 14 Units into the skin daily., Disp: , Rfl:    isosorbide mononitrate (IMDUR) 60 MG 24 hr tablet, TAKE 2 TABLETS (120 MG TOTAL) BY MOUTH DAILY., Disp: 180 tablet, Rfl: 1   Lancet Devices (ACCU-CHEK SOFTCLIX) lancets, USE TO TEST BLOOD GLUCOSE THREE TIMES DAILY, Disp: 1 each, Rfl: 0   latanoprost (XALATAN) 0.005 % ophthalmic solution, Place 1 drop into both eyes at bedtime. , Disp: , Rfl:    nitroGLYCERIN (NITROSTAT) 0.4 MG SL tablet, Place 1 tablet (0.4 mg  total) under the tongue every 5 (five) minutes as needed for chest pain (3 doses MAX)., Disp: 75 tablet, Rfl: 1   omeprazole (PRILOSEC) 20 MG capsule, TAKE 1 CAPSULE EVERY DAY, Disp: 90 capsule, Rfl: 4   OVER THE COUNTER MEDICATION, Take 1 tablet by mouth daily. Prostate formula, Disp: , Rfl:    pentoxifylline (TRENTAL) 400 MG CR tablet, TAKE 1 TABLET TWICE DAILY AT 10AM AND 5PM, Disp: 180 tablet, Rfl: 1   potassium chloride SA (KLOR-CON M) 20 MEQ tablet, TAKE 1 TABLET EVERY DAY, Disp: 90 tablet, Rfl: 0   timolol (TIMOPTIC) 0.5 % ophthalmic solution, Place 1 drop into both eyes in the morning and at bedtime., Disp: , Rfl:    COLCRYS 0.6 MG tablet, TAKE 1 TABLET EVERY DAY (Patient not taking: Reported on 03/24/2021), Disp: 90 tablet, Rfl: 1  Review of Systems:  Constitutional: Denies fever, chills, diaphoresis, appetite change. HEENT: Denies photophobia, eye pain, redness, hearing loss, ear pain, congestion, sore throat, rhinorrhea, sneezing, mouth sores, trouble swallowing, neck pain, neck stiffness and tinnitus.   Respiratory: Denies  cough, chest tightness,  and wheezing.   Cardiovascular: Denies chest pain, palpitations and leg swelling.  Gastrointestinal: Denies nausea, vomiting, abdominal pain, diarrhea, constipation, blood in stool and abdominal distention.   Genitourinary: Denies dysuria, urgency, frequency, hematuria, flank pain and difficulty urinating.  Endocrine: Denies: hot or cold intolerance, sweats, changes in hair or nails, polyuria, polydipsia. Musculoskeletal: Denies myalgias, back pain, joint swelling, arthralgias and gait problem.  Skin: Denies pallor, rash and wound.  Neurological: Denies dizziness, seizures, syncope,  light-headedness, numbness and headaches.  Hematological: Denies adenopathy. Easy bruising, personal or family bleeding history  Psychiatric/Behavioral: Denies suicidal ideation, mood changes, confusion, nervousness, sleep disturbance and agitation    Physical Exam: Vitals:   03/24/21 1109  BP: (!) 122/56  Pulse: (!) 56  Temp: 97.8 F (36.6 C)  TempSrc: Oral  SpO2: 98%  Weight: 145 lb (65.8 kg)    Body mass index is 20.81 kg/m.   Constitutional: NAD, calm, comfortable Eyes: PERRL, lids and conjunctivae normal ENMT: Mucous membranes are moist.  Respiratory: clear to auscultation bilaterally, no wheezing, no crackles. Normal respiratory effort. No accessory muscle use.  Cardiovascular: Bilateral crackles.  2+ pitting lower extremity edema. Abdomen: no tenderness, no masses palpated. No hepatosplenomegaly. Bowel sounds positive.  Neurologic: Grossly intact and nonfocal Psychiatric: Normal judgment and insight. Alert and oriented x 3. Normal mood.    Impression and Plan:  Acute on chronic combined systolic and diastolic congestive heart failure (HCC) - Plan: DG Chest 2 View  SOB (shortness of breath)  -I believe he likely has an acute exacerbation of his chronic combined heart failure. -He is chronically on furosemide 80 mg daily, I have asked him to increase the dose to 80 mg twice daily for the next 3 days. -Will order chest x-ray today. -Will send message to his cardiologist to see if he can be seen sooner, if not we will plan for follow-up visit with me in 2 weeks. -He knows to go to emergency  department if significantly increased shortness of breath or worsening edema. -Do not believe ED visit is necessary today as he is not exhibiting signs of respiratory distress, not hypoxemic and denies chest pain.  Time spent: 34 minutes reviewing chart, interviewing and examining patient and formulating plan of care.   Patient Instructions  -Nice seeing you today!!  -Xray today.  -For the next 3 days take 2 tablets of furosemide daily instead of  1.  -Will see if you can be seen sooner with Dr. Tamala Julian.    Lelon Frohlich, MD [facility]

## 2021-03-24 NOTE — Patient Instructions (Signed)
-  Nice seeing you today!!  -Xray today.  -For the next 3 days take 2 tablets of furosemide daily instead of 1.  -Will see if you can be seen sooner with Dr. Tamala Julian.

## 2021-03-29 ENCOUNTER — Other Ambulatory Visit: Payer: Self-pay | Admitting: Internal Medicine

## 2021-03-31 ENCOUNTER — Telehealth: Payer: Self-pay | Admitting: Pharmacist

## 2021-03-31 NOTE — Chronic Care Management (AMB) (Signed)
° ° °  Chronic Care Management Pharmacy Assistant   Name: [name]  MRN: [id number] DOB: Jul 26, 1931  04/02/2021 APPOINTMENT REMINDER   Called [name], No answer, left message of appointment on 04/02/2021 at 11:15 via telephone visit with Jeni Salles, Pharm D. Notified to have all medications, supplements, blood pressure and/or blood sugar logs available during appointment and to return call if need to reschedule.  Care Gaps: AWV - [name] notified to schedule appointment. Last BP - 122/56  on 03/24/2021 Last A1C - 6.5 on 01/13/2021 Foot exam - overdue  Covid booster - overdue  Star Rating Drug: Atorvastatin 80mg  - last filled 02/23/2021 90DS at [facility]  Any gaps in medications fill history? No  [name] [facility]  Catering manager [phone]

## 2021-04-02 ENCOUNTER — Ambulatory Visit (INDEPENDENT_AMBULATORY_CARE_PROVIDER_SITE_OTHER): Payer: Medicare HMO | Admitting: Pharmacist

## 2021-04-02 DIAGNOSIS — I5043 Acute on chronic combined systolic (congestive) and diastolic (congestive) heart failure: Secondary | ICD-10-CM

## 2021-04-02 DIAGNOSIS — E1122 Type 2 diabetes mellitus with diabetic chronic kidney disease: Secondary | ICD-10-CM

## 2021-04-02 DIAGNOSIS — Z794 Long term (current) use of insulin: Secondary | ICD-10-CM

## 2021-04-02 NOTE — Progress Notes (Signed)
Chronic Care Management Pharmacy Note  04/02/2021 Name:  [name] MRN:  [id number] DOB:  [birth year]  Summary: Pt did not get Freestyle libre at pharmacy Pt has not had any BGs <70 but may have hypoglycemic unawareness  Recommendations/Changes made from today's visit: -Recommend daily weight monitoring at home -Recommend repeat uric acid level, vitamin B12 and lipid panel -Consider switching to GLP1 for safety and less risk of hypoglycemia  Plan: Follow up DM assessment in 2 months  Subjective: [name] is an 88 y.o. year old male who is a primary patient of [name], [name], MD.  The CCM team was consulted for assistance with disease management and care coordination needs.    Engaged with patient by telephone for follow up visit in response to provider referral for pharmacy case management and/or care coordination services.   Consent to Services:  The patient was given information about Chronic Care Management services, agreed to services, and gave verbal consent prior to initiation of services.  Please see initial visit note for detailed documentation.   Patient Care Team: [name], [name], MD as PCP - General (Internal Medicine) Belva Crome, MD as PCP - Cardiology (Cardiology) Viona Gilmore, [facility] as Pharmacist (Pharmacist)  Recent office visits: 03/24/21 Domingo Mend, MD: Patient presented for fatigue. Increased furosemide to 80 mg BID for 3 days. Sent message to cardiology to be seen sooner.  03/15/20 Patient presented for vitamin B12 injection.   01/13/2021 Estela [name] MD - Patient was seen for Type 2 diabetes and additional issues. No medication changes. Follow up in 3 months.  10/09/2020 [name], [name], MD - Patient was seen for Essential hypertension and 4 other issues. No medication changes. Follow up in 3 months.  Recent consult visits: 12/31/2020 Luberta Mutter MD (opthalmology) - Patient  was seen for glaucoma evaluation. No medication changes. Follow up in 6 months.  11/25/20 Melony Overly, MD (ENT): Patient presented for cerumen impaction and removal.  10/30/2020 Belva Crome, MD (Cardiology) - Patient was seen for Chronic combined systolic and diastolic heart failure and 5 other issues. No medication changes.  Follow up visit in 6 months.  Hospital visits: None in previous 6 months  Objective:  Lab Results  Component Value Date   CREATININE 3.5 (A) 03/05/2021   BUN 47 (H) 08/05/2019   GFR 23.08 (L) 08/02/2017   GFRNONAA 14 (L) 08/05/2019   GFRAA 16 03/05/2021   NA 141 03/05/2021   K 4.9 03/05/2021   CALCIUM 9.4 03/05/2021   CO2 22 08/05/2019   GLUCOSE 144 (H) 08/05/2019    Lab Results  Component Value Date/Time   HGBA1C 6.5 (A) 01/13/2021 10:52 AM   HGBA1C 6.0 (A) 10/09/2020 10:25 AM   HGBA1C 7.5 (H) 08/11/2018 03:19 PM   HGBA1C 7.0 (H) 08/02/2017 01:52 PM   GFR 23.08 (L) 08/02/2017 01:52 PM   GFR 21.53 (L) 11/17/2016 11:29 AM   MICROALBUR 30.8 (H) 08/02/2017 01:52 PM   MICROALBUR 94.9 (H) 03/28/2014 10:21 AM    Last diabetic Eye exam:  Lab Results  Component Value Date/Time   HMDIABEYEEXA No Retinopathy 02/15/2021 12:00 AM    Last diabetic Foot exam: No results found for: HMDIABFOOTEX   Lab Results  Component Value Date   CHOL 120 11/21/2018   HDL 23 (L) 11/21/2018   LDLCALC 61 11/21/2018   LDLDIRECT 93.3 01/22/2013   TRIG 215 (H) 11/21/2018   CHOLHDL 5.2 (H) 11/21/2018    Hepatic Function  Latest Ref Rng & Units 03/05/2021 07/16/2019 11/21/2018  Total Protein 6.0 - 8.5 g/dL - 7.2 7.1  Albumin 3.5 - 5.0 4.0 4.2 4.5  AST 0 - 40 IU/L - 16 15  ALT 0 - 44 IU/L - 14 14  Alk Phosphatase 48 - 121 IU/L - 105 90  Total Bilirubin 0.0 - 1.2 mg/dL - 0.4 0.3  Bilirubin, Direct 0.00 - 0.40 mg/dL - - 0.11    Lab Results  Component Value Date/Time   TSH 4.84 (H) 08/02/2017 01:52 PM   TSH 2.846 12/29/2014 07:42 PM   TSH 3.608 09/10/2014 08:33 PM    TSH 4.99 (H) 03/28/2014 10:21 AM   FREET4 0.61 09/10/2014 08:33 PM    CBC Latest Ref Rng & Units 03/05/2021 07/15/2020 06/24/2020  WBC 4.0 - 10.5 K/uL - - -  Hemoglobin 13.5 - 17.5 10.1(A) 9.3(L) 9.3(L)  Hematocrit 39.0 - 52.0 % - - -  Platelets 150.0 - 400.0 K/uL - - -    No results found for: VD25OH  Clinical ASCVD: Yes  The ASCVD Risk score (Arnett DK, et al., 2019) failed to calculate for the following reasons:   The 2019 ASCVD risk score is only valid for ages 72 to 4    Depression screen PHQ 2/9 03/24/2021 07/09/2020 11/21/2019  Decreased Interest 0 0 0  Down, Depressed, Hopeless 0 0 0  PHQ - 2 Score 0 0 0  Altered sleeping - 0 0  Tired, decreased energy - 0 0  Change in appetite - 3 0  Feeling bad or failure about yourself  - 1 0  Trouble concentrating - 0 0  Moving slowly or fidgety/restless - 0 0  Suicidal thoughts - 0 0  PHQ-9 Score - 4 0  Difficult doing work/chores - Not difficult at all Not difficult at all  Some recent data might be hidden      Social History   Tobacco Use  Smoking Status Former   Packs/day: 0.50   Years: 20.00   Pack years: 10.00   Types: Cigarettes  Smokeless Tobacco Never  Tobacco Comments   "quit smoking cigarettes in the 1960s    BP Readings from Last 3 Encounters:  03/24/21 (!) 122/56  01/13/21 120/68  10/30/20 (!) 118/52   Pulse Readings from Last 3 Encounters:  03/24/21 (!) 56  01/13/21 (!) 56  10/30/20 (!) 52   Wt Readings from Last 3 Encounters:  03/24/21 145 lb (65.8 kg)  01/13/21 142 lb 11.2 oz (64.7 kg)  10/30/20 149 lb (67.6 kg)   BMI Readings from Last 3 Encounters:  03/24/21 20.81 kg/m  01/13/21 20.48 kg/m  10/30/20 21.38 kg/m    Assessment/Interventions: Review of patient past medical history, allergies, medications, health status, including review of consultants reports, laboratory and other test data, was performed as part of comprehensive evaluation and provision of chronic care management services.    SDOH:  (Social Determinants of Health) assessments and interventions performed: No   CCM Care Plan  No Known Allergies  Medications Reviewed Today     Reviewed by Viona Gilmore, [facility] (Pharmacist) on 04/02/21 at 1144  Med List Status: <None>   Medication Order Taking? Sig Documenting Provider Last Dose Status Informant  Accu-Chek Softclix Lancets lancets [id number]  TEST BLOOD SUGAR EVERY DAY [name], [name], MD  Active   acetaminophen (TYLENOL) 500 MG tablet [id number]  Take 500 mg by mouth every 8 (eight) hours as needed for mild pain or headache. [provider]  Active Self  Alcohol Swabs (ALCOHOL PREP) PADS [id number]  USE TO TEST BLOOD GLUCOSE THREE TIMES DAILY Marletta Lor, MD  Active Self  amLODipine (NORVASC) 10 MG tablet [id number] Yes TAKE 1 TABLET EVERY DAY [name], [name], MD Taking Active   aspirin EC 81 MG tablet [id number]  Take 81 mg by mouth at bedtime. [provider]  Active Self  atorvastatin (LIPITOR) 80 MG tablet [id number]  TAKE 1 TABLET EVERY DAY  AT  Moshe Salisbury, MD  Active   BD PEN NEEDLE NANO U/F 32G X 4 MM MISC [id number]  INJECT TWO TIMES DAILY AS NEEDED. Marletta Lor, MD  Active Self  bisacodyl (DULCOLAX) 5 MG EC tablet [id number]  Take 5 mg by mouth daily as needed for moderate constipation. [provider]  Active Self  Blood Glucose Monitoring Suppl (ACCU-CHEK AVIVA PLUS) w/Device KIT [id number]  USE AS DIRECTED [name], [name], MD  Active   brimonidine [facility]) 0.2 % ophthalmic solution [id number]  Place 1 drop into both eyes 2 (two) times daily. [provider]  Active   carvedilol (COREG) 12.5 MG tablet [id number] Yes Take 1 tablet (12.5 mg total) by mouth 2 (two) times daily. Belva Crome, MD Taking Active   cloNIDine (CATAPRES) 0.1 MG tablet [id number]  TAKE 1 TABLET AT BEDTIME [name], [name], MD  Active   COLCRYS 0.6 MG tablet [id number]  TAKE 1 TABLET  EVERY DAY  Patient not taking: Reported on 03/24/2021   [name], [name], MD  Active   dorzolamide-timolol (COSOPT) 22.3-6.8 MG/ML ophthalmic solution [id number]  Place 1 drop into both eyes 2 (two) times daily. [provider]  Active Self  Ferrous Sulfate (IRON) 325 (65 Fe) MG TABS [id number]  Take 1 tablet by mouth in the morning, at noon, and at bedtime. [provider]  Active Self  furosemide (LASIX) 80 MG tablet [id number] Yes TAKE 1 TABLET EVERY DAY Belva Crome, MD Taking Active   glucose blood (ACCU-CHEK GUIDE) test strip [id number]  TEST BLOOD SUGAR EVERY DAY [name], [name], MD  Active   hydrALAZINE (APRESOLINE) 25 MG tablet [id number]  TAKE 1 TABLET (25 MG TOTAL) BY MOUTH 3 (THREE) TIMES DAILY. Belva Crome, MD  Active   insulin aspart (NOVOLOG) 100 UNIT/ML FlexPen [id number]  Inject 5 Units into the skin 3 (three) times daily with meals. [provider]  Active Self  insulin glargine (LANTUS SOLOSTAR) 100 UNIT/ML Solostar Pen [id number]  Inject 10-12 Units into the skin daily. [provider]  Active   isosorbide mononitrate (IMDUR) 60 MG 24 hr tablet [id number]  TAKE 2 TABLETS (120 MG TOTAL) BY MOUTH DAILY. Belva Crome, MD  Active   Lancet Devices [facility]) lancets [id number]  USE TO TEST BLOOD GLUCOSE THREE TIMES DAILY Marletta Lor, MD  Active Self  latanoprost (XALATAN) 0.005 % ophthalmic solution [id number]  Place 1 drop into both eyes at bedtime.  [provider]  Active Self  nitroGLYCERIN (NITROSTAT) 0.4 MG SL tablet [id number]  Place 1 tablet (0.4 mg total) under the tongue every 5 (five) minutes as needed for chest pain (3 doses MAX). Belva Crome, MD  Active Self  omeprazole (PRILOSEC) 20 MG capsule [id number]  TAKE 1 CAPSULE EVERY DAY Ritta Slot  Active   OVER THE COUNTER MEDICATION [id number]  Take 1 tablet by mouth daily. Prostate formula [provider]  Active    pentoxifylline (TRENTAL)  400 MG CR tablet [id number]  TAKE 1 TABLET TWICE DAILY AT 10AM AND 5PM [name], [name], MD  Active   potassium chloride SA Rebeca Allegra M) 20 MEQ tablet [id number]  TAKE 1 TABLET EVERY DAY Belva Crome, MD  Active   timolol (TIMOPTIC) 0.5 % ophthalmic solution [id number]  Place 1 drop into both eyes in the morning and at bedtime. [provider]  Active             Patient Active Problem List   Diagnosis Date Noted   Iron deficiency anemia due to chronic blood loss 07/09/2019   Personal history of arterial venous malformation (AVM) 07/09/2019   Acute on chronic respiratory failure with hypoxia (HCC)    Acute exacerbation of CHF (congestive heart failure) ([place]) 08/11/2018   Gout due to renal impairment involving toe 02/08/2017   Insulin dependent diabetes mellitus    Chronic combined systolic and diastolic heart failure (HCC)    Acute blood loss anemia 05/10/2015   CKD (chronic kidney disease) stage 5, GFR less than 15 ml/min (HCC)    B12 deficiency 06/27/2011   Peripheral vascular disease ([place]) 01/19/2007   Peptic ulcer disease- 2013 01/19/2007   Diabetes mellitus type 2, insulin dependent ([place]) 10/02/2006   Dyslipidemia 10/02/2006   Essential hypertension 10/02/2006   Coronary artery disease involving 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 ([place]) 10/02/2006   BENIGN PROSTATIC HYPERTROPHY 10/02/2006    Immunization History  Administered Date(s) Administered   Fluad Quad(high Dose 65+) 11/14/2018   Influenza Split 01/07/2011, 11/26/2012, 12/07/2016   Influenza Whole 02/28/2005, 12/07/2007, 12/19/2008, 11/27/2009   Influenza, High Dose Seasonal PF 12/28/2015, 11/10/2019, 11/09/2020   Influenza,inj,Quad PF,6+ Mos 11/13/2013, 11/09/2014   Influenza-Unspecified 11/08/2016, 11/16/2017, 12/02/2017, 11/08/2018   Moderna SARS-COV2 Booster Vaccination 12/28/2019   Moderna Sars-Covid-2 Vaccination 03/19/2019, 04/01/2019,  04/16/2019, 04/29/2019   Pneumococcal Conjugate-13 01/28/2013, 11/08/2014   Pneumococcal Polysaccharide-23 02/28/2005, 02/28/2009   Tdap 01/13/2012   Zoster Recombinat (Shingrix) 03/17/2021   Patient is not having as much swelling he doesn't think but he wasn't aware of his swelling when he came into see his PCP. He didn't see that he lost any weight with Lasix. He has been elevated his feet. He doesn't use salt to season with you. Patient reports he hasn't lost weight.   Conditions to be addressed/monitored:  Hypertension, Hyperlipidemia, Diabetes, Coronary Artery Disease, GERD, Chronic Kidney Disease, Gout and anemia  Conditions addressed this visit: Heart failure, diabetes  Care Plan : CCM Pharmacy Care Plan  Updates made by Viona Gilmore, [place] since 04/02/2021 12:00 AM     Problem: Problem: Hypertension, Hyperlipidemia, Diabetes, Coronary Artery Disease, GERD, Chronic Kidney Disease, Gout and anemia      Long-Range Goal: Patient-Specific Goal   Start Date: 05/19/2020  Expected End Date: 05/19/2021  Recent Progress: On track  Priority: High  Note:   Current Barriers:  Unable to independently monitor therapeutic efficacy  Pharmacist Clinical Goal(s):  Patient will achieve adherence to monitoring guidelines and medication adherence to achieve therapeutic efficacy through collaboration with PharmD and provider.   Interventions: 1:1 collaboration with [name], [name], MD regarding development and update of comprehensive plan of care as evidenced by provider attestation and co-signature Inter-disciplinary care team collaboration (see longitudinal plan of care) Comprehensive medication review performed; medication list updated in electronic medical record  Hypertension (BP goal <140/90) -Controlled -Current treatment: Amlodipine 75m, 1 tablet once daily  - Appropriate, Effective, Safe, Accessible Carvedilol 215m 1 tablet  twice daily with meals - Appropriate,  Effective, Safe, Accessible Clonidine 0.55m, 1 tablet at bedtime - Appropriate, Effective, Query Safe, Accessible Hydralazine 221m 1 tablet three times daily - Appropriate, Effective, Safe, Accessible -Medications previously tried: benazepril, felodipine, labetalol, metoprolol  -Current home readings:  123, 125, 130/65-70 (checking once or twice a week) -Current dietary habits: uses very little salt but does get takeout a couple times a week -Current [facility] habits: [facility]; goes for walks -Denies hypotensive/hypertensive symptoms -Educated on Exercise goal of 150 minutes per week; Importance of home blood pressure monitoring; -Counseled to monitor BP at home weekly, document, and provide log at future appointments -Counseled on diet and exercise extensively Recommended to continue current medication  Hyperlipidemia: (LDL goal < 55) -Uncontrolled -Current treatment: Atorvastatin 8028m tablet daily - Appropriate, Query effective, Safe, Accessible -Medications previously tried: simvastatin  -Current dietary patterns: did not discuss -Current [facility] habits: [facility]; goes for walks -Educated on Cholesterol goals;  Importance of limiting foods high in cholesterol; Exercise goal of 150 minutes per week; -Counseled on diet and exercise extensively Recommended to continue current medication Recommended repeat lipid panel  Coronary artery disease (Goal: prevent heart events) -Controlled -Current treatment  Isosorbide mononitrate 80m95m tablets once daily - Appropriate, Effective, Safe, Accessible Nitroglycerin 0.4mg 32m place 1 tablet under tongue every five minutes as needed for chest pain (3 doses max) - Appropriate, Effective, Safe, Accessible Aspirin 81mg,55mablet once daily - Appropriate, Effective, Safe, Accessible -Medications previously tried: none  -Recommended to continue current  medication  Diabetes (A1c goal <7%) -Controlled -Current medications: Insulin aspart (Novolog Flexpen), inject 5 units three times daily with meals  - Appropriate, Effective, Query Safe, Accessible Insulin glargine (Lantus Solostar) inject 10-12 units once daily at 10 pm (doesn't inject below 90) - Appropriate, Effective, Query Safe, Accessible -Medications previously tried: glipizide  -Current home glucose readings fasting glucose: 120 (usually within this range)  post prandial glucose: 135 -Denies hypoglycemic/hyperglycemic symptoms (not < 70) -Current meal patterns:  breakfast: n/a lunch: n/a  dinner: n/a snacks: n/a drinks: tries to watch what he eats but does drink some soda and sweet tea -Current exercise: [employer] class once a week and walking daily -Educated on Exercise goal of 150 minutes per week; Benefits of routine self-monitoring of blood sugar; Continuous glucose monitoring; Carbohydrate counting and/or plate method -Counseled to check feet daily and get yearly eye exams -Recommended to continue current medication  Heart Failure (Goal: manage symptoms and prevent exacerbations) -Controlled -Last ejection fraction: 30-35% (Date: 08/05/19) -HF type: Combined Systolic and Diastolic -NYHA Class: II (slight limitation of activity) -AHA HF Stage: C (Heart disease and symptoms present) -Current treatment: Carvedilol 25mg, 38mblet twice daily with meals - Appropriate, Effective, Safe, Accessible Clonidine 0.1mg, 1 56mlet at bedtime  - Appropriate, Effective, Query Safe, Accessible Hydralazine 25mg, 1 56met three times daily - Appropriate, Effective, Safe, Accessible Isosorbide mononitrate 80mg 24 h48mblet, 2 tablet once daily  - Appropriate, Effective, Safe, Accessible Potassium chloride 20mEq, 2 t50mts for first dose, then take 1 tablet once daily - Appropriate, Effective, Safe, Accessible Furosemide 80mg, 1 tab67monce daily - Appropriate, Query effective, Safe,  Accessible -Medications previously tried: n/a  -Current home BP/HR readings: reports all are normal -Current dietary habits: tries to limit salt intake -Current exercise habits: weekly [employer] class and walking almost daily -Educated on Benefits of medications for managing symptoms and  prolonging life Importance of weighing daily; if you gain more than 3 pounds in one day or 5 pounds in one week, call cardiologist Importance of blood pressure control -Recommended to continue current medication Recommended switching to torsemide if patient is still retaining fluid.   Barrett's esophagus/GERD/PUD (Goal: minimize symptoms) -Controlled -Current treatment  Omeprazole 20 mg 1 capsule daily - Appropriate, Effective, Safe, Accessible -Medications previously tried: pantoprazole  -Recommended to continue current medication  Gout (Goal: uric acid < 6 and prevent flare ups) -Controlled -Current treatment  Colchicine 0.64m, 1 tablet once daily as needed  - Appropriate, Effective, Safe, Accessible -Medications previously tried: none  -Recommended repeat uric acid level  Peripheral vascular disease (Goal: prevent heart events) -Controlled -Current treatment  Pentoxifylline (Trental) 4057m 1 tablet twice daily (at 10 AM and 5PM) - Appropriate, Effective, Safe, Accessible -Medications previously tried: none  -Recommended to continue current medication  Vitamin B12 deficiency (Goal: vitamin B12 > 211) -Controlled -Current treatment  Cyanocobalamin 100077minject once every 30 days - Appropriate, Effective, Safe, Accessible -Medications previously tried: none  -Recommended repeat vitamin B12 level  Glaucoma (Goal: lower intraocular pressure) -Controlled -Current treatment  Dorzolamide/ timolol mealate eye drops, 1 drop in both eyes twice a day - Appropriate, Effective, Safe, Accessible -Medications previously tried: n/a  -Recommended to continue current medication  Iron deficiency anemia  (Goal: Hgb > 11) -Controlled -Current treatment  Ferrous sulfate 65 mg 1 tablet three times daily - Appropriate, Effective, Safe, Accessible -Medications previously tried: none  -Recommended to continue current medication  Stage 4 CKD (Goal: minimize damage to kidneys) -Controlled -Current treatment  No medications -Medications previously tried: n/a  - Continue to adjust medications as needed   Health Maintenance -Vaccine gaps: shingrix -Current therapy:  APAP 500m57m tablet every eight hours as needed for mild pain or headache  Bisacodyl (Dulcolax) 5mg,26mtablet once daily as needed for moderate constipation  -Educated on Cost vs benefit of each product must be carefully weighed by individual consumer -Patient is satisfied with current therapy and denies issues -Recommended to continue current medication  Patient Goals/Self-Care Activities Patient will:  - take medications as prescribed check glucose daily, document, and provide at future appointments check blood pressure weekly, document, and provide at future appointments target a minimum of 150 minutes of moderate intensity exercise weekly  Follow Up Plan: Telephone follow up appointment with care management team member scheduled for: 4 months      Medication Assistance: None required.  Patient affirms current coverage meets needs.  Compliance/Adherence/Medication fill history: Care Gaps: Shingrix, foot exam, COVID booster Last BP - 122/56  on 03/24/2021 Last A1C - 6.5 on 01/13/2021  Star-[facility] Drugs: Atorvastatin 80mg 21mst filled 02/23/2021 [facility]:  [place] [address] [address]: [phone] [address] [address]: [phone] pill box?  No - patient reports taking for a long time. Does not use pill box. Reports never forgetting dose.  Pt endorses 100% compliance  We discussed: Current pharmacy is preferred with insurance plan and patient is satisfied with pharmacy services Patient decided to: Continue current medication management strategy  Care Plan and Follow Up Patient Decision:  Patient agrees to Care Plan and Follow-up.  Plan: Telephone follow up appointment with care management  team member scheduled for:  4 months  Jeni Salles, PharmD [facility] Clinical Pharmacist [employer] at [place] [phone]

## 2021-04-05 ENCOUNTER — Other Ambulatory Visit: Payer: Self-pay | Admitting: Interventional Cardiology

## 2021-04-15 ENCOUNTER — Ambulatory Visit: Payer: Medicare HMO | Admitting: Internal Medicine

## 2021-04-15 ENCOUNTER — Ambulatory Visit (INDEPENDENT_AMBULATORY_CARE_PROVIDER_SITE_OTHER): Payer: Medicare HMO | Admitting: Internal Medicine

## 2021-04-15 VITALS — BP 120/70 | HR 55 | Temp 98.1°F | Wt 149.9 lb

## 2021-04-15 DIAGNOSIS — Z794 Long term (current) use of insulin: Secondary | ICD-10-CM | POA: Diagnosis not present

## 2021-04-15 DIAGNOSIS — E1122 Type 2 diabetes mellitus with diabetic chronic kidney disease: Secondary | ICD-10-CM | POA: Diagnosis not present

## 2021-04-15 DIAGNOSIS — E785 Hyperlipidemia, unspecified: Secondary | ICD-10-CM

## 2021-04-15 DIAGNOSIS — I10 Essential (primary) hypertension: Secondary | ICD-10-CM

## 2021-04-15 DIAGNOSIS — N185 Chronic kidney disease, stage 5: Secondary | ICD-10-CM

## 2021-04-15 DIAGNOSIS — E538 Deficiency of other specified B group vitamins: Secondary | ICD-10-CM

## 2021-04-15 DIAGNOSIS — I5042 Chronic combined systolic (congestive) and diastolic (congestive) heart failure: Secondary | ICD-10-CM | POA: Diagnosis not present

## 2021-04-15 LAB — POCT GLYCOSYLATED HEMOGLOBIN (HGB A1C): Hemoglobin A1C: 6.8 % — AB (ref 4.0–5.6)

## 2021-04-15 LAB — COMPREHENSIVE METABOLIC PANEL
ALT: 12 U/L (ref 0–53)
AST: 14 U/L (ref 0–37)
Albumin: 4 g/dL (ref 3.5–5.2)
Alkaline Phosphatase: 65 U/L (ref 39–117)
BUN: 74 mg/dL — ABNORMAL HIGH (ref 6–23)
CO2: 19 mEq/L (ref 19–32)
Calcium: 9 mg/dL (ref 8.4–10.5)
Chloride: 109 mEq/L (ref 96–112)
Creatinine, Ser: 4.16 mg/dL — ABNORMAL HIGH (ref 0.40–1.50)
GFR: 12.21 mL/min — CL (ref >60.00)
Glucose, Bld: 151 mg/dL — ABNORMAL HIGH (ref 70–99)
Potassium: 4.3 mEq/L (ref 3.5–5.1)
Sodium: 138 mEq/L (ref 135–145)
Total Bilirubin: 0.4 mg/dL (ref 0.2–1.2)
Total Protein: 6.8 g/dL (ref 6.0–8.3)

## 2021-04-15 MED ORDER — CYANOCOBALAMIN 1000 MCG/ML IJ SOLN
1000.0000 ug | Freq: Once | INTRAMUSCULAR | Status: AC
  Administered 2021-04-15: 1000 ug via INTRAMUSCULAR

## 2021-04-15 NOTE — Progress Notes (Signed)
Established Patient Office Visit     This visit occurred during the SARS-CoV-2 public health emergency.  Safety protocols were in place, including screening questions prior to the visit, additional usage of staff PPE, and extensive cleaning of exam room while observing appropriate contact time as indicated for disinfecting solutions.    CC/Reason for Visit: 2-week follow-up acute on chronic combined heart failure  HPI: [name] is a 88 y.o. male who is coming in today for the above mentioned reasons. Past Medical History is significant for: Hypertension, hyperlipidemia, stage V chronic kidney disease, chronic combined heart failure, coronary artery disease, vitamin B12 deficiency.  At last visit on January 25 he was noted to be a little short of breath.  Chest x-ray was normal.  He did have lower extremity edema that was above baseline.  He is on furosemide 80 mg daily.  He was asked to double his dose for 3 days which he did.  He is here today for follow-up.  A1c in office today is 6.8.  Blood pressure is 120/70.  He weighs 149 pounds today, 145 pounds when I last saw him on January 21 and 143 pounds before that in November.  Complex management due to advanced chronic kidney disease stage V.  He tells me that he still feels a little short winded but it is improved from last visit.  O2 sats in office today are 98% on room air.  He is due for his monthly B12 injection.   Past Medical/Surgical History: Past Medical History:  Diagnosis Date   Arthritis    "touch in my fingers" (09/10/2014)   Atrophic gastritis    AVM (arteriovenous malformation) of duodenum, acquired    B12 deficiency anemia    "stopped taking the shots in ~ 06/2014" (09/10/2014)   Barrett's esophagus    CAD (coronary artery disease)    a. H/o MI in 1998;  b. s/p CABG 2003. c. 10/2014 NSTEMI/Cath: LM 75, LAD 132md, D1 95, D2 50, LCX 100ost/p, OM1 80, OM2 80, RCA 100p/m, RPDA fills via L->L collats, LIMA->LAD ok,  VG->dRCA 100, VG->OM1->OM2 99 prox to OM1 insertion->recanalated ->Tx with heparin,  VG->D1 100-->Med Rx.   Cataract    Chronic systolic CHF (congestive heart failure) ([place]    a. EF 40-45% in 10/2014 (previously normal in 08/2014).   CKD (chronic kidney disease), stage IV (HCC)    Diabetes 1.5, managed as type 1 ([place]    Hiatal hernia 2018   endoscopy -Dr. AHavery Moros  History of stomach ulcers 2013   Hyperlipidemia    Hypertension    Hypertensive heart disease    Ischemic cardiomyopathy    a. 10/2014 Echo: EF 40-45%.   Myocardial infarction ([facility]    PVD (peripheral vascular disease) ([place]    Type II diabetes mellitus ([place]     Past Surgical History:  Procedure Laterality Date   CARDIAC CATHETERIZATION  2003;    CARDIAC CATHETERIZATION N/A 11/07/2014   Left Heart Cath; HBelva Crome MD;    CATARACT EXTRACTION, BILATERAL Bilateral    CHOLECYSTECTOMY N/A 05/20/2015   Procedure: LAPAROSCOPIC CHOLECYSTECTOMY;  Surgeon: DCoralie Keens MD;  [place]  Service: General;  Laterality: N/A;   COLONOSCOPY     COLONOSCOPY N/A 05/12/2015   Procedure: COLONOSCOPY;  Surgeon: DMilus Banister MD;  [place]  Service: Endoscopy;  Laterality: N/A;   CORONARY ANGIOPLASTY  1998   /notes 07/13/2010   CORONARY ARTERY BYPASS GRAFT  2003  CABG X5   ESOPHAGOGASTRODUODENOSCOPY  06/10/2011   Procedure: ESOPHAGOGASTRODUODENOSCOPY (EGD);  Surgeon: Ladene Artist, MD,FACG;  [facility] ENDOSCOPY;  Service: Endoscopy;  Laterality: N/A;   ESOPHAGOGASTRODUODENOSCOPY N/A 10/04/2016   Procedure: ESOPHAGOGASTRODUODENOSCOPY (EGD);  Surgeon: Manus Gunning, MD;  [facility];  Service: Gastroenterology;  Laterality: N/A;    Social History:  reports that he has quit smoking. His smoking use included cigarettes. He has a 10.00 pack-year smoking history. He has never used smokeless tobacco. He reports that he does not drink alcohol and does not use drugs.  Allergies: No Known  Allergies  Family History:  Family History  Problem Relation Age of Onset   Stroke Mother    Hypertension Mother    Cancer Mother    Diabetes Other        brothers and sisters   Coronary artery disease Other    Hypertension Sister    Heart attack Sister    Colon cancer Neg Hx      Current Outpatient Medications:    Accu-Chek Softclix Lancets lancets, TEST BLOOD SUGAR EVERY DAY, Disp: 100 each, Rfl: 12   acetaminophen (TYLENOL) 500 MG tablet, Take 500 mg by mouth every 8 (eight) hours as needed for mild pain or headache., Disp: , Rfl:    Alcohol Swabs (ALCOHOL PREP) PADS, USE TO TEST BLOOD GLUCOSE THREE TIMES DAILY, Disp: 300 each, Rfl: 3   amLODipine (NORVASC) 10 MG tablet, TAKE 1 TABLET EVERY DAY, Disp: 90 tablet, Rfl: 1   aspirin EC 81 MG tablet, Take 81 mg by mouth at bedtime., Disp: , Rfl:    atorvastatin (LIPITOR) 80 MG tablet, TAKE 1 TABLET EVERY DAY  AT  6PM, Disp: 90 tablet, Rfl: 2   BD PEN NEEDLE NANO U/F 32G X 4 MM MISC, INJECT TWO TIMES DAILY AS NEEDED., Disp: 180 each, Rfl: 3   bisacodyl (DULCOLAX) 5 MG EC tablet, Take 5 mg by mouth daily as needed for moderate constipation., Disp: , Rfl:    Blood Glucose Monitoring Suppl (ACCU-CHEK AVIVA PLUS) w/Device KIT, USE AS DIRECTED, Disp: 1 kit, Rfl: 0   brimonidine (ALPHAGAN) 0.2 % ophthalmic solution, Place 1 drop into both eyes 2 (two) times daily., Disp: , Rfl:    carvedilol (COREG) 12.5 MG tablet, Take 1 tablet (12.5 mg total) by mouth 2 (two) times daily., Disp: 180 tablet, Rfl: 3   cloNIDine (CATAPRES) 0.1 MG tablet, TAKE 1 TABLET AT BEDTIME, Disp: 90 tablet, Rfl: 1   COLCRYS 0.6 MG tablet, TAKE 1 TABLET EVERY DAY, Disp: 90 tablet, Rfl: 1   dorzolamide-timolol (COSOPT) 22.3-6.8 MG/ML ophthalmic solution, Place 1 drop into both eyes 2 (two) times daily., Disp: , Rfl:    Ferrous Sulfate (IRON) 325 (65 Fe) MG TABS, Take 1 tablet by mouth in the morning, at noon, and at bedtime., Disp: , Rfl:    furosemide (LASIX) 80 MG  tablet, Take 1 tablet (80 mg total) by mouth daily. Please keep upcoming appointment in march 2023 for future refills. Thank you, Disp: 90 tablet, Rfl: 0   glucose blood (ACCU-CHEK GUIDE) test strip, TEST BLOOD SUGAR EVERY DAY, Disp: 100 strip, Rfl: 1   hydrALAZINE (APRESOLINE) 25 MG tablet, TAKE 1 TABLET (25 MG TOTAL) BY MOUTH 3 (THREE) TIMES DAILY., Disp: 270 tablet, Rfl: 1   insulin aspart (NOVOLOG) 100 UNIT/ML FlexPen, Inject 5 Units into the skin 3 (three) times daily with meals., Disp: , Rfl:    insulin glargine (LANTUS SOLOSTAR) 100 UNIT/ML Solostar Pen, Inject  10-12 Units into the skin daily., Disp: , Rfl:    isosorbide mononitrate (IMDUR) 60 MG 24 hr tablet, TAKE 2 TABLETS (120 MG TOTAL) BY MOUTH DAILY., Disp: 180 tablet, Rfl: 1   Lancet Devices (ACCU-CHEK SOFTCLIX) lancets, USE TO TEST BLOOD GLUCOSE THREE TIMES DAILY, Disp: 1 each, Rfl: 0   latanoprost (XALATAN) 0.005 % ophthalmic solution, Place 1 drop into both eyes at bedtime. , Disp: , Rfl:    nitroGLYCERIN (NITROSTAT) 0.4 MG SL tablet, Place 1 tablet (0.4 mg total) under the tongue every 5 (five) minutes as needed for chest pain (3 doses MAX)., Disp: 75 tablet, Rfl: 1   omeprazole (PRILOSEC) 20 MG capsule, TAKE 1 CAPSULE EVERY DAY, Disp: 90 capsule, Rfl: 4   OVER THE COUNTER MEDICATION, Take 1 tablet by mouth daily. Prostate formula, Disp: , Rfl:    pentoxifylline (TRENTAL) 400 MG CR tablet, TAKE 1 TABLET TWICE DAILY AT 10AM AND 5PM, Disp: 180 tablet, Rfl: 1   potassium chloride SA (KLOR-CON M) 20 MEQ tablet, TAKE 1 TABLET EVERY DAY, Disp: 90 tablet, Rfl: 0   timolol (TIMOPTIC) 0.5 % ophthalmic solution, Place 1 drop into both eyes in the morning and at bedtime., Disp: , Rfl:   Current Facility-Administered Medications:    cyanocobalamin ((VITAMIN B-12)) injection 1,000 mcg, 1,000 mcg, Intramuscular, Once, [name], [name], MD  Review of Systems:  Constitutional: Denies fever, chills, diaphoresis, appetite change.  HEENT:  Denies photophobia, eye pain, redness, hearing loss, ear pain, congestion, sore throat, rhinorrhea, sneezing, mouth sores, trouble swallowing, neck pain, neck stiffness and tinnitus.   Respiratory: Denies  cough, chest tightness,  and wheezing.   Cardiovascular: Denies chest pain, palpitations and leg swelling.  Gastrointestinal: Denies nausea, vomiting, abdominal pain, diarrhea, constipation, blood in stool and abdominal distention.  Genitourinary: Denies dysuria, urgency, frequency, hematuria, flank pain and difficulty urinating.  Endocrine: Denies: hot or cold intolerance, sweats, changes in hair or nails, polyuria, polydipsia. Musculoskeletal: Denies myalgias, back pain, joint swelling, arthralgias and gait problem.  Skin: Denies pallor, rash and wound.  Neurological: Denies dizziness, seizures, syncope,  light-headedness, numbness and headaches.  Hematological: Denies adenopathy. Easy bruising, personal or family bleeding history  Psychiatric/Behavioral: Denies suicidal ideation, mood changes, confusion, nervousness, sleep disturbance and agitation    Physical Exam: Vitals:   04/15/21 1001  BP: 120/70  Pulse: (!) 55  Temp: 98.1 F (36.7 C)  TempSrc: Oral  SpO2: 98%  Weight: 149 lb 14.4 oz (68 kg)    Body mass index is 21.51 kg/m.   Constitutional: NAD, calm, comfortable Eyes: PERRL, lids and conjunctivae normal ENMT: Mucous membranes are moist.  Respiratory: clear to auscultation bilaterally, no wheezing, no crackles. Normal respiratory effort. No accessory muscle use.  Cardiovascular: Regular rate and rhythm, no murmurs / rubs / gallops.  2+ pitting lower extremity edema left greater than right.  Psychiatric: Normal judgment and insight. Alert and oriented x 3. Normal mood.    Impression and Plan:  Type 2 diabetes mellitus with stage 5 chronic kidney disease not on chronic dialysis, with long-term current use of insulin ([place]) - Plan: POCT glycosylated hemoglobin (Hb  A1C)  B12 deficiency - Plan: cyanocobalamin ((VITAMIN B-12)) injection 1,000 mcg  Chronic combined systolic and diastolic heart failure (HCC)  CKD (chronic kidney disease) stage 5, GFR less than 15 ml/min (HCC) - Plan: Comprehensive metabolic panel  Dyslipidemia  Essential hypertension  -He is certainly improved from last visit 2 weeks ago, he does have some chronic shortness of breath  which he admits is close to his baseline.  Diuresis is challenging given his ejection fraction of 30% in conjunction with a stage V kidney disease.  He has stated his wishes of not undergoing dialysis in the past.  He follows with both cardiology and nephrology.  He has an appointment with cardiology scheduled for March, he is on the standby list for an earlier appointment. -In office A1c is 6.8 which demonstrates good control. -Monthly IM B12 administered today.  Time spent: 33 minutes reviewing chart, interviewing and examining patient and formulating plan of care.      Lelon Frohlich, MD [facility]

## 2021-04-27 DIAGNOSIS — I5040 Unspecified combined systolic (congestive) and diastolic (congestive) heart failure: Secondary | ICD-10-CM | POA: Diagnosis not present

## 2021-04-27 DIAGNOSIS — E785 Hyperlipidemia, unspecified: Secondary | ICD-10-CM

## 2021-04-27 DIAGNOSIS — Z794 Long term (current) use of insulin: Secondary | ICD-10-CM

## 2021-04-27 DIAGNOSIS — I739 Peripheral vascular disease, unspecified: Secondary | ICD-10-CM | POA: Diagnosis not present

## 2021-04-27 DIAGNOSIS — I2510 Atherosclerotic heart disease of native coronary artery without angina pectoris: Secondary | ICD-10-CM | POA: Diagnosis not present

## 2021-04-27 DIAGNOSIS — D509 Iron deficiency anemia, unspecified: Secondary | ICD-10-CM

## 2021-04-27 DIAGNOSIS — N184 Chronic kidney disease, stage 4 (severe): Secondary | ICD-10-CM | POA: Diagnosis not present

## 2021-04-27 DIAGNOSIS — E1159 Type 2 diabetes mellitus with other circulatory complications: Secondary | ICD-10-CM

## 2021-04-27 DIAGNOSIS — E538 Deficiency of other specified B group vitamins: Secondary | ICD-10-CM

## 2021-04-27 DIAGNOSIS — H409 Unspecified glaucoma: Secondary | ICD-10-CM | POA: Diagnosis not present

## 2021-04-27 DIAGNOSIS — I130 Hypertensive heart and chronic kidney disease with heart failure and stage 1 through stage 4 chronic kidney disease, or unspecified chronic kidney disease: Secondary | ICD-10-CM

## 2021-05-13 ENCOUNTER — Ambulatory Visit (INDEPENDENT_AMBULATORY_CARE_PROVIDER_SITE_OTHER): Payer: Medicare HMO

## 2021-05-13 DIAGNOSIS — E538 Deficiency of other specified B group vitamins: Secondary | ICD-10-CM

## 2021-05-13 MED ORDER — CYANOCOBALAMIN 1000 MCG/ML IJ SOLN
1000.0000 ug | INTRAMUSCULAR | Status: AC
  Administered 2021-05-13 – 2021-07-14 (×3): 1000 ug via INTRAMUSCULAR

## 2021-05-13 NOTE — Progress Notes (Signed)
Pt here for monthly B12 injection per Dr Jerilee Hoh. ? ?B12 1042mg given IM right deltoid and pt tolerated injection well. ? ?Next B12 injection scheduled for 06/14/21. ?

## 2021-05-18 NOTE — Progress Notes (Signed)
?Cardiology Office Note:   ? ?Date:  05/19/2021  ? ?ID:  [name], DOB 22-Apr-1931, MRN [id number] ? ?PCP:  [name], [name], MD  ?Cardiologist:  [name], MD  ? ?Referring MD: [name], [name]*  ? ?Chief Complaint  ?Patient presents with  ? Congestive Heart Failure  ? Coronary Artery Disease  ? ? ?History of Present Illness:   ? ?[name] is a 88 y.o. male with a hx of CAD, bypass graft failure with only remaining conduit LIMA to LAD by cath in September 2016, chronic kidney disease stage IV, diabetes mellitus II, chronic systolic heart failure with LVEF 35-40%, peripheral arterial disease, hypertension, stomach ulcers (with GI bleeding while on Plavix), and hyperlipidemia. ? ?Feels terrible.  Progressive shortness of breath with dyspnea on exertion being the major limiting factor.  No appetite.  Denies angina.  No nitroglycerin use.  No orthopnea.  Denies edema. ? ?Past Medical History:  ?Diagnosis Date  ? Arthritis   ? "touch in my fingers" (09/10/2014)  ? Atrophic gastritis   ? AVM (arteriovenous malformation) of duodenum, acquired   ? B12 deficiency anemia   ? "stopped taking the shots in ~ 06/2014" (09/10/2014)  ? Barrett's esophagus   ? CAD (coronary artery disease)   ? a. H/o MI in 1998;  b. s/p CABG 2003. c. 10/2014 NSTEMI/Cath: LM 75, LAD 147md, D1 95, D2 50, LCX 100ost/p, OM1 80, OM2 80, RCA 100p/m, RPDA fills via L->L collats, LIMA->LAD ok, VG->dRCA 100, VG->OM1->OM2 99 prox to OM1 insertion->recanalated ->Tx with heparin,  VG->D1 100-->Med Rx.  ? Cataract   ? Chronic systolic CHF (congestive heart failure) ([place]   ? a. EF 40-45% in 10/2014 (previously normal in 08/2014).  ? CKD (chronic kidney disease), stage IV ([place]   ? Diabetes 1.5, managed as type 1 ([place]   ? Hiatal hernia 2018  ? endoscopy -Dr. AHavery Moros ? History of stomach ulcers 2013  ? Hyperlipidemia   ? Hypertension   ? Hypertensive heart disease   ? Ischemic cardiomyopathy   ? a. 10/2014 Echo: EF  40-45%.  ? Myocardial infarction ([facility]   ? PVD (peripheral vascular disease) ([place]   ? Type II diabetes mellitus ([place]   ? ? ?Past Surgical History:  ?Procedure Laterality Date  ? CARDIAC CATHETERIZATION  2003;   ? CARDIAC CATHETERIZATION N/A 11/07/2014  ? Left Heart Cath; HBelva Crome MD;   ? CATARACT EXTRACTION, BILATERAL Bilateral   ? CHOLECYSTECTOMY N/A 05/20/2015  ? Procedure: LAPAROSCOPIC CHOLECYSTECTOMY;  Surgeon: DCoralie Keens MD;  [place]  Service: General;  Laterality: N/A;  ? COLONOSCOPY    ? COLONOSCOPY N/A 05/12/2015  ? Procedure: COLONOSCOPY;  Surgeon: DMilus Banister MD;  [place]  Service: Endoscopy;  Laterality: N/A;  ? CORONARY ANGIOPLASTY  1998  ? /notes 07/13/2010  ? CORONARY ARTERY BYPASS GRAFT  2003  ? CABG X5  ? ESOPHAGOGASTRODUODENOSCOPY  06/10/2011  ? Procedure: ESOPHAGOGASTRODUODENOSCOPY (EGD);  Surgeon: MLadene Artist MD,FACG;  [facility];  Service: Endoscopy;  Laterality: N/A;  ? ESOPHAGOGASTRODUODENOSCOPY N/A 10/04/2016  ? Procedure: ESOPHAGOGASTRODUODENOSCOPY (EGD);  Surgeon: AManus Gunning MD;  [facility];  Service: Gastroenterology;  Laterality: N/A;  ? ? ?Current Medications: ?Current Meds  ?Medication Sig  ? Accu-Chek Softclix Lancets lancets TEST BLOOD SUGAR EVERY DAY  ? acetaminophen (TYLENOL) 500 MG tablet Take 500 mg by mouth every 8 (eight) hours as needed for mild pain or headache.  ?  Alcohol Swabs (ALCOHOL PREP) PADS USE TO TEST BLOOD GLUCOSE THREE TIMES DAILY  ? amLODipine (NORVASC) 10 MG tablet TAKE 1 TABLET EVERY DAY  ? aspirin EC 81 MG tablet Take 81 mg by mouth at bedtime.  ? atorvastatin (LIPITOR) 80 MG tablet TAKE 1 TABLET EVERY DAY  AT  6PM  ? BD PEN NEEDLE NANO U/F 32G X 4 MM MISC INJECT TWO TIMES DAILY AS NEEDED.  ? bisacodyl (DULCOLAX) 5 MG EC tablet Take 5 mg by mouth daily as needed for moderate constipation.  ? Blood Glucose Monitoring Suppl (ACCU-CHEK AVIVA PLUS) w/Device KIT USE AS DIRECTED  ?  brimonidine (ALPHAGAN) 0.2 % ophthalmic solution Place 1 drop into both eyes 2 (two) times daily.  ? carvedilol (COREG) 12.5 MG tablet Take 1 tablet (12.5 mg total) by mouth 2 (two) times daily.  ? cloNIDine (CATAPRES) 0.1 MG tablet TAKE 1 TABLET AT BEDTIME  ? COLCRYS 0.6 MG tablet TAKE 1 TABLET EVERY DAY  ? dorzolamide-timolol (COSOPT) 22.3-6.8 MG/ML ophthalmic solution Place 1 drop into both eyes 2 (two) times daily.  ? Ferrous Sulfate (IRON) 325 (65 Fe) MG TABS Take 1 tablet by mouth in the morning, at noon, and at bedtime.  ? furosemide (LASIX) 80 MG tablet Take 1 tablet (80 mg total) by mouth daily. Please keep upcoming appointment in march 2023 for future refills. Thank you  ? glucose blood (ACCU-CHEK GUIDE) test strip TEST BLOOD SUGAR EVERY DAY  ? hydrALAZINE (APRESOLINE) 25 MG tablet TAKE 1 TABLET (25 MG TOTAL) BY MOUTH 3 (THREE) TIMES DAILY.  ? insulin aspart (NOVOLOG) 100 UNIT/ML FlexPen Inject 5 Units into the skin 3 (three) times daily with meals.  ? insulin glargine (LANTUS SOLOSTAR) 100 UNIT/ML Solostar Pen Inject 10-12 Units into the skin daily.  ? isosorbide mononitrate (IMDUR) 60 MG 24 hr tablet TAKE 2 TABLETS (120 MG TOTAL) BY MOUTH DAILY.  ? Lancet Devices (ACCU-CHEK SOFTCLIX) lancets USE TO TEST BLOOD GLUCOSE THREE TIMES DAILY  ? latanoprost (XALATAN) 0.005 % ophthalmic solution Place 1 drop into both eyes at bedtime.   ? nitroGLYCERIN (NITROSTAT) 0.4 MG SL tablet Place 1 tablet (0.4 mg total) under the tongue every 5 (five) minutes as needed for chest pain (3 doses MAX).  ? omeprazole (PRILOSEC) 20 MG capsule TAKE 1 CAPSULE EVERY DAY  ? OVER THE COUNTER MEDICATION Take 1 tablet by mouth daily. Prostate formula  ? pentoxifylline (TRENTAL) 400 MG CR tablet TAKE 1 TABLET TWICE DAILY AT 10AM AND 5PM  ? potassium chloride SA (KLOR-CON M) 20 MEQ tablet TAKE 1 TABLET EVERY DAY  ? timolol (TIMOPTIC) 0.5 % ophthalmic solution Place 1 drop into both eyes in the morning and at bedtime.  ? ?Current  Facility-Administered Medications for the 05/19/21 encounter (Office Visit) with Smith, Henry W, MD  ?Medication  ? cyanocobalamin ((VITAMIN B-12)) injection 1,000 mcg  ?  ? ?Allergies:   Patient has no known allergies.  ? ?Social History  ? ?Socioeconomic History  ? Marital status: Widowed  ?  Spouse name: Not on file  ? Number of children: Not on file  ? Years of education: Not on file  ? Highest education level: Not on file  ?Occupational History  ? Occupation: Retired  ?Tobacco Use  ? Smoking status: Former  ?  Packs/day: 0.50  ?  Years: 20.00  ?  Pack years: 10.00  ?  Types: Cigarettes  ? Smokeless tobacco: Never  ? Tobacco comments:  ?  "quit smoking cigarettes in the   1960s   ?Vaping Use  ? Vaping Use: Never used  ?Substance and Sexual Activity  ? Alcohol use: No  ? Drug use: No  ? Sexual activity: Not Currently  ?Other Topics Concern  ? Not on file  ?Social History Narrative  ? Pt lives alone, family nearby.  ? ?Social Determinants of Health  ? ?Financial Resource Strain: Not on file  ?Food Insecurity: Not on file  ?Transportation Needs: Not on file  ?Physical Activity: Not on file  ?Stress: Not on file  ?Social Connections: Not on file  ?  ? ?Family History: ?The patient's family history includes Cancer in his mother; Coronary artery disease in an other family member; Diabetes in an other family member; Heart attack in his sister; Hypertension in his mother and sister; Stroke in his mother. There is no history of Colon cancer. ? ?ROS:   ?Please see the history of present illness.    ?Feels sad.  Feels he has no control over how things are going.  Still lives independently.  No falls.  Feels he is losing his strength.  All other systems reviewed and are negative. ? ?EKGs/Labs/Other Studies Reviewed:   ? ?The following studies were reviewed today: ?2D Doppler echocardiogram June 2021: ?IMPRESSIONS  ? ? ? 1. Left ventricular ejection fraction, by estimation, is 30 to 35%. The  ?left ventricle has moderately  decreased function. The left ventricle  ?demonstrates regional wall motion abnormalities (see scoring  ?diagram/findings for description). The left  ?ventricular internal cavity size was mildly to moderately dilated. Left  ?v

## 2021-05-19 ENCOUNTER — Ambulatory Visit: Payer: Medicare HMO | Admitting: Interventional Cardiology

## 2021-05-19 ENCOUNTER — Other Ambulatory Visit: Payer: Self-pay

## 2021-05-19 ENCOUNTER — Encounter: Payer: Self-pay | Admitting: Interventional Cardiology

## 2021-05-19 VITALS — BP 128/58 | HR 58 | Ht 70.0 in | Wt 147.6 lb

## 2021-05-19 DIAGNOSIS — Z794 Long term (current) use of insulin: Secondary | ICD-10-CM

## 2021-05-19 DIAGNOSIS — I493 Ventricular premature depolarization: Secondary | ICD-10-CM | POA: Diagnosis not present

## 2021-05-19 DIAGNOSIS — E0821 Diabetes mellitus due to underlying condition with diabetic nephropathy: Secondary | ICD-10-CM | POA: Diagnosis not present

## 2021-05-19 DIAGNOSIS — N184 Chronic kidney disease, stage 4 (severe): Secondary | ICD-10-CM

## 2021-05-19 DIAGNOSIS — E785 Hyperlipidemia, unspecified: Secondary | ICD-10-CM | POA: Diagnosis not present

## 2021-05-19 DIAGNOSIS — I5042 Chronic combined systolic (congestive) and diastolic (congestive) heart failure: Secondary | ICD-10-CM | POA: Diagnosis not present

## 2021-05-19 DIAGNOSIS — I10 Essential (primary) hypertension: Secondary | ICD-10-CM | POA: Diagnosis not present

## 2021-05-19 DIAGNOSIS — I25709 Atherosclerosis of coronary artery bypass graft(s), unspecified, with unspecified angina pectoris: Secondary | ICD-10-CM

## 2021-05-19 NOTE — Patient Instructions (Signed)
Medication Instructions:  ?Your physician recommends that you continue on your current medications as directed. Please refer to the Current Medication list given to you today. ? ?*If you need a refill on your cardiac medications before your next appointment, please call your pharmacy* ? ? ?Lab Work: ?None ?If you have labs (blood work) drawn today and your tests are completely normal, you will receive your results only by: ?MyChart Message (if you have MyChart) OR ?A paper copy in the mail ?If you have any lab test that is abnormal or we need to change your treatment, we will call you to review the results. ? ? ?Testing/Procedures: ?Your physician has requested that you have an echocardiogram. Echocardiography is a painless test that uses sound waves to create images of your heart. It provides your doctor with information about the size and shape of your heart and how well your heart?s chambers and valves are working. This procedure takes approximately one hour. There are no restrictions for this procedure. ? ? ?Follow-Up: ?At [facility], you and your health needs are our priority.  As part of our continuing mission to provide you with exceptional heart care, we have created designated Provider Care Teams.  These Care Teams include your primary Cardiologist (physician) and Advanced Practice Providers (APPs -  Physician Assistants and Nurse Practitioners) who all work together to provide you with the care you need, when you need it. ? ?We recommend signing up for the patient portal called "MyChart".  Sign up information is provided on this After Visit Summary.  MyChart is used to connect with patients for Virtual Visits (Telemedicine).  Patients are able to view lab/test results, encounter notes, upcoming appointments, etc.  Non-urgent messages can be sent to your provider as well.   ?To learn more about what you can do with MyChart, go to [email or web address].   ? ?Your next appointment:   ?3-4  month(s) ? ?The format for your next appointment:   ?In Person ? ?Provider:   ?[name], MD  ? ? ?Other Instructions  ?

## 2021-06-03 ENCOUNTER — Other Ambulatory Visit: Payer: Self-pay | Admitting: Interventional Cardiology

## 2021-06-03 ENCOUNTER — Other Ambulatory Visit: Payer: Self-pay | Admitting: Internal Medicine

## 2021-06-09 ENCOUNTER — Ambulatory Visit (HOSPITAL_COMMUNITY): Payer: Medicare HMO | Attending: Cardiovascular Disease

## 2021-06-09 DIAGNOSIS — I5042 Chronic combined systolic (congestive) and diastolic (congestive) heart failure: Secondary | ICD-10-CM | POA: Diagnosis not present

## 2021-06-09 DIAGNOSIS — I25709 Atherosclerosis of coronary artery bypass graft(s), unspecified, with unspecified angina pectoris: Secondary | ICD-10-CM | POA: Insufficient documentation

## 2021-06-09 DIAGNOSIS — R0609 Other forms of dyspnea: Secondary | ICD-10-CM | POA: Diagnosis not present

## 2021-06-09 LAB — ECHOCARDIOGRAM COMPLETE
AR max vel: 2.01 cm2
AV Area VTI: 1.91 cm2
AV Area mean vel: 1.84 cm2
AV Mean grad: 10 mmHg
AV Peak grad: 16.3 mmHg
Ao pk vel: 2.02 m/s
Area-P 1/2: 4.26 cm2
P 1/2 time: 478 msec
S' Lateral: 5 cm

## 2021-06-09 NOTE — Progress Notes (Unsigned)
Consulted with patient's cardiologist, Dr. Daneen Schick. ?

## 2021-06-14 ENCOUNTER — Ambulatory Visit (INDEPENDENT_AMBULATORY_CARE_PROVIDER_SITE_OTHER): Payer: Medicare HMO

## 2021-06-14 DIAGNOSIS — E538 Deficiency of other specified B group vitamins: Secondary | ICD-10-CM | POA: Diagnosis not present

## 2021-06-14 NOTE — Progress Notes (Signed)
Pt here for monthly B12 injection per Dr. Jerilee Hoh. ? ?B12 1032mg given IM left deltoid and pt tolerated injection well. ? ?Next B12 injection scheduled for 07/14/21. ? ?

## 2021-06-15 ENCOUNTER — Telehealth: Payer: Self-pay | Admitting: Pharmacist

## 2021-06-15 NOTE — Chronic Care Management (AMB) (Signed)
? ? ?Chronic Care Management ?Pharmacy Assistant  ? ?Name: [name]  MRN: [id number] DOB: [birth year] ? ?Reason for Encounter: Disease State / Hypertension and Diabetes Assessment Call ?  ?Conditions to be addressed/monitored: ?HTN and DMII ? ?Recent office visits:  ?04/15/2021 Thersa Salt MD - Patient was seen for Type 2 diabetes mellitus with stage 5 chronic kidney disease not on chronic dialysis, with long-term current use of insulin and additional issues. No medication changes. No follow up noted.  ? ?Recent consult visits:  ?05/19/2021 Daneen Schick MD (cardiology) - Patient was seen for  ?Coronary artery disease involving 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 and additional issues. No medication changes. Follow up in 4 months.  ? ?Hospital visits:  ?None ? ?Medications: ?Outpatient Encounter Medications as of 06/15/2021  ?Medication Sig  ? Accu-Chek Softclix Lancets lancets TEST BLOOD SUGAR EVERY DAY  ? acetaminophen (TYLENOL) 500 MG tablet Take 500 mg by mouth every 8 (eight) hours as needed for mild pain or headache.  ? Alcohol Swabs (ALCOHOL PREP) PADS USE TO TEST BLOOD GLUCOSE THREE TIMES DAILY  ? amLODipine (NORVASC) 10 MG tablet TAKE 1 TABLET EVERY DAY  ? aspirin EC 81 MG tablet Take 81 mg by mouth at bedtime.  ? atorvastatin (LIPITOR) 80 MG tablet TAKE 1 TABLET EVERY DAY  AT  6PM  ? BD PEN NEEDLE NANO U/F 32G X 4 MM MISC INJECT TWO TIMES DAILY AS NEEDED.  ? bisacodyl (DULCOLAX) 5 MG EC tablet Take 5 mg by mouth daily as needed for moderate constipation.  ? Blood Glucose Monitoring Suppl (ACCU-CHEK AVIVA PLUS) w/Device KIT USE AS DIRECTED  ? brimonidine (ALPHAGAN) 0.2 % ophthalmic solution Place 1 drop into both eyes 2 (two) times daily.  ? carvedilol (COREG) 12.5 MG tablet Take 1 tablet (12.5 mg total) by mouth 2 (two) times daily.  ? cloNIDine (CATAPRES) 0.1 MG tablet TAKE 1 TABLET AT BEDTIME  ? COLCRYS 0.6 MG tablet TAKE 1 TABLET EVERY DAY  ? dorzolamide-timolol  (COSOPT) 22.3-6.8 MG/ML ophthalmic solution Place 1 drop into both eyes 2 (two) times daily.  ? Ferrous Sulfate (IRON) 325 (65 Fe) MG TABS Take 1 tablet by mouth in the morning, at noon, and at bedtime.  ? furosemide (LASIX) 80 MG tablet Take 1 tablet (80 mg total) by mouth daily. Please keep upcoming appointment in march 2023 for future refills. Thank you  ? glucose blood (ACCU-CHEK GUIDE) test strip TEST BLOOD SUGAR EVERY DAY  ? hydrALAZINE (APRESOLINE) 25 MG tablet TAKE 1 TABLET (25 MG TOTAL) BY MOUTH 3 (THREE) TIMES DAILY.  ? insulin aspart (NOVOLOG) 100 UNIT/ML FlexPen Inject 5 Units into the skin 3 (three) times daily with meals.  ? insulin glargine (LANTUS SOLOSTAR) 100 UNIT/ML Solostar Pen Inject 10-12 Units into the skin daily.  ? isosorbide mononitrate (IMDUR) 60 MG 24 hr tablet Take 1 tablet (60 mg total) by mouth in the morning and at bedtime.  ? Lancet Devices (ACCU-CHEK SOFTCLIX) lancets USE TO TEST BLOOD GLUCOSE THREE TIMES DAILY  ? latanoprost (XALATAN) 0.005 % ophthalmic solution Place 1 drop into both eyes at bedtime.   ? nitroGLYCERIN (NITROSTAT) 0.4 MG SL tablet Place 1 tablet (0.4 mg total) under the tongue every 5 (five) minutes as needed for chest pain (3 doses MAX).  ? omeprazole (PRILOSEC) 20 MG capsule TAKE 1 CAPSULE EVERY DAY  ? OVER THE COUNTER MEDICATION Take 1 tablet by mouth daily. Prostate formula  ? pentoxifylline (TRENTAL) 400 MG  CR tablet TAKE 1 TABLET TWICE DAILY AT 10AM AND 5PM  ? potassium chloride SA (KLOR-CON M) 20 MEQ tablet TAKE 1 TABLET EVERY DAY  ? timolol (TIMOPTIC) 0.5 % ophthalmic solution Place 1 drop into both eyes in the morning and at bedtime.  ? ?Facility-Administered Encounter Medications as of 06/15/2021  ?Medication  ? cyanocobalamin ((VITAMIN B-12)) injection 1,000 mcg  ?Fill History:  ?atorvastatin 80 mg tablet 05/06/2021 90  ? ?brimonidine 0.2 % eye drops 06/03/2021 90  ? ?carvedilol 12.5 mg tablet 06/09/2021 90  ? ?dorzolamide 22.3 mg-timolol 6.8 mg/mL eye  drops 05/02/2021 90  ? ?Accu-Chek Guide test strips 04/30/2021 90  ? ?furosemide 80 mg tablet 04/06/2021 90  ? ?isosorbide mononitrate ER 60 mg tablet,extended release 24 hr 06/07/2021 90  ? ?latanoprost 0.005 % eye drops 04/27/2021 90  ? ?omeprazole 20 mg capsule,delayed release 04/05/2021 90  ? ?pentoxifylline ER 400 mg tablet,extended release 06/09/2021 90  ? ?potassium chloride ER 20 mEq tablet,extended release(part/cryst) 05/06/2021 90  ? ?amlodipine 10 mg tablet 04/30/2021 90  ? ?clonidine HCl 0.1 mg tablet 06/07/2021 90  ? ?hydralazine 25 mg tablet 05/06/2021 90  ? ?Reviewed chart prior to disease state call. Spoke with patient regarding BP ? ?Recent Office Vitals: ?BP Readings from Last 3 Encounters:  ?05/19/21 (!) 128/58  ?04/15/21 120/70  ?03/24/21 (!) 122/56  ? ?Pulse Readings from Last 3 Encounters:  ?05/19/21 (!) 58  ?04/15/21 (!) 55  ?03/24/21 (!) 56  ?  ?Wt Readings from Last 3 Encounters:  ?05/19/21 147 lb 9.6 oz (67 kg)  ?04/15/21 149 lb 14.4 oz (68 kg)  ?03/24/21 145 lb (65.8 kg)  ?  ? ?Kidney Function ?Lab Results  ?Component Value Date/Time  ? CREATININE 4.16 (H) 04/15/2021 10:22 AM  ? CREATININE 3.5 (A) 03/05/2021 12:00 AM  ? CREATININE 3.65 (H) 08/05/2019 04:48 PM  ? CREATININE 3.25 (H) 06/19/2015 09:33 AM  ? CREATININE 3.36 (H) 06/03/2015 10:19 AM  ? GFR 12.21 (LL) 04/15/2021 10:22 AM  ? GFRNONAA 14 (L) 08/05/2019 04:48 PM  ? GFRAA 16 03/05/2021 12:00 AM  ? ? ? ?  Latest Ref Rng & Units 04/15/2021  ? 10:22 AM 03/05/2021  ? 12:00 AM 08/05/2019  ?  4:48 PM  ?BMP  ?Glucose 70 - 99 mg/dL 151    144    ?BUN 6 - 23 mg/dL 74    47    ?Creatinine 0.40 - 1.50 mg/dL 4.16   3.5      3.65    ?BUN/Creat Ratio 10 - 24   13    ?Sodium 135 - 145 mEq/L 138   141      143    ?Potassium 3.5 - 5.1 mEq/L 4.3   4.9      3.9    ?Chloride 96 - 112 mEq/L 109   111      107    ?CO2 19 - 32 mEq/L 19    22    ?Calcium 8.4 - 10.5 mg/dL 9.0   9.4      9.2    ?  ? This result is from an external source.  ? ? ?Recent Relevant  Labs: ?Lab Results  ?Component Value Date/Time  ? HGBA1C 6.8 (A) 04/15/2021 10:04 AM  ? HGBA1C 6.5 (A) 01/13/2021 10:52 AM  ? HGBA1C 7.5 (H) 08/11/2018 03:19 PM  ? HGBA1C 7.0 (H) 08/02/2017 01:52 PM  ? MICROALBUR 30.8 (H) 08/02/2017 01:52 PM  ? MICROALBUR 94.9 (H) 03/28/2014 10:21 AM  ?  ?  Kidney Function ?Lab Results  ?Component Value Date/Time  ? CREATININE 4.16 (H) 04/15/2021 10:22 AM  ? CREATININE 3.5 (A) 03/05/2021 12:00 AM  ? CREATININE 3.65 (H) 08/05/2019 04:48 PM  ? CREATININE 3.25 (H) 06/19/2015 09:33 AM  ? CREATININE 3.36 (H) 06/03/2015 10:19 AM  ? GFR 12.21 (LL) 04/15/2021 10:22 AM  ? GFRNONAA 14 (L) 08/05/2019 04:48 PM  ? GFRAA 16 03/05/2021 12:00 AM  ? ? ?Current antihyperglycemic regimen:  ?Lantus solostar - 10-12 units daily ?Novolog flexpen 100 un/ml 5 units 3 times daily ? ?What recent interventions/DTPs have been made to improve glycemic control:  ?No recent interventions ? ?Have there been any recent hospitalizations or ED visits since last visit with CPP? No recent hospital visits ? ?Patient denies hypoglycemic symptoms ? ?Patient denies hyperglycemic symptoms ? ?How often are you checking your blood sugar? Patient states he is checking blood sugars daily ? ?What are your blood sugars ranging?  ?Patient states his blood sugars both fasting and no fasting are always between 100 and 105, he doesn't have actual numbers available.  ? ?During the week, how often does your blood glucose drop below 70? Patient denies any readings below 70 ? ?Are you checking your feet daily/regularly? Patient states he checks his feel daily ? ?Current antihypertensive regimen:  ?Amlodipine 5 mg daily ?Carvedilol 12.5 mg twice daily ?Hydralazine 25 mg three times daily ?Imdur 60 mg twice daily  ? ?How often are you checking your Blood Pressure? Patient states he checks blood pressures weekly ? ?Current home BP readings: Patient states his most recent reading was 122/60 and this is the range of all his readings. ? ?What  recent interventions/DTPs have been made by any provider to improve Blood Pressure control since last CPP Visit: No recent interventions ? ?Any recent hospitalizations or ED visits since last visit with CPP

## 2021-06-16 ENCOUNTER — Telehealth: Payer: Self-pay | Admitting: Internal Medicine

## 2021-06-16 ENCOUNTER — Encounter: Payer: Self-pay | Admitting: Interventional Cardiology

## 2021-06-16 ENCOUNTER — Ambulatory Visit: Payer: Medicare HMO | Admitting: Interventional Cardiology

## 2021-06-16 ENCOUNTER — Telehealth: Payer: Self-pay | Admitting: Interventional Cardiology

## 2021-06-16 VITALS — BP 112/58 | HR 57 | Ht 70.0 in | Wt 144.8 lb

## 2021-06-16 DIAGNOSIS — I5042 Chronic combined systolic (congestive) and diastolic (congestive) heart failure: Secondary | ICD-10-CM | POA: Diagnosis not present

## 2021-06-16 DIAGNOSIS — Z7189 Other specified counseling: Secondary | ICD-10-CM | POA: Diagnosis not present

## 2021-06-16 DIAGNOSIS — I25709 Atherosclerosis of coronary artery bypass graft(s), unspecified, with unspecified angina pectoris: Secondary | ICD-10-CM

## 2021-06-16 DIAGNOSIS — N184 Chronic kidney disease, stage 4 (severe): Secondary | ICD-10-CM | POA: Diagnosis not present

## 2021-06-16 DIAGNOSIS — E119 Type 2 diabetes mellitus without complications: Secondary | ICD-10-CM

## 2021-06-16 DIAGNOSIS — R5383 Other fatigue: Secondary | ICD-10-CM

## 2021-06-16 DIAGNOSIS — R630 Anorexia: Secondary | ICD-10-CM

## 2021-06-16 DIAGNOSIS — R269 Unspecified abnormalities of gait and mobility: Secondary | ICD-10-CM

## 2021-06-16 DIAGNOSIS — N185 Chronic kidney disease, stage 5: Secondary | ICD-10-CM

## 2021-06-16 NOTE — Progress Notes (Signed)
?Cardiology Office Note:   ? ?Date:  06/16/2021  ? ?ID:  [name], DOB 1931/05/13, MRN [id number] ? ?PCP:  [name], [name], MD  ?Cardiologist:  [name], MD  ? ?Referring MD: [name], [name]*  ? ?Chief Complaint  ?Patient presents with  ? Coronary Artery Disease  ? Congestive Heart Failure  ? Follow-up  ?  End-stage kidney disease  ? ? ?History of Present Illness:   ? ?[name] is a 88 y.o. male with a hx of CAD, bypass graft failure with only remaining conduit LIMA to LAD by cath in September 2016, chronic kidney disease stage IV, diabetes mellitus II, chronic systolic heart failure with LVEF 35-40%, peripheral arterial disease, hypertension, stomach ulcers (with GI bleeding while on Plavix), and hyperlipidemia. ? ?The patient is complaining of increasing fatigue, dyspnea on exertion, and decreasing appetite.  He is losing weight.  He denies orthopnea and chest discomfort.  He has not had syncope.  He seems depressed.  He is living alone. ?\ ? ?I discussed with him that long-term prognosis is guarded.  We called his oldest son and had a three-way conversation during this office visit concerning goals of care and a formal palliative care consultation.  After significant conversation they agreed to palliative care conversation. ? ?Past Medical History:  ?Diagnosis Date  ? Arthritis   ? "touch in my fingers" (09/10/2014)  ? Atrophic gastritis   ? AVM (arteriovenous malformation) of duodenum, acquired   ? B12 deficiency anemia   ? "stopped taking the shots in ~ 06/2014" (09/10/2014)  ? Barrett's esophagus   ? CAD (coronary artery disease)   ? a. H/o MI in 1998;  b. s/p CABG 2003. c. 10/2014 NSTEMI/Cath: LM 75, LAD 160md, D1 95, D2 50, LCX 100ost/p, OM1 80, OM2 80, RCA 100p/m, RPDA fills via L->L collats, LIMA->LAD ok, VG->dRCA 100, VG->OM1->OM2 99 prox to OM1 insertion->recanalated ->Tx with heparin,  VG->D1 100-->Med Rx.  ? Cataract   ? Chronic systolic CHF (congestive  heart failure) ([facility]   ? a. EF 40-45% in 10/2014 (previously normal in 08/2014).  ? CKD (chronic kidney disease), stage IV ([facility]   ? Diabetes 1.5, managed as type 1 ([facility]   ? Hiatal hernia 2018  ? endoscopy -Dr. AHavery Moros ? History of stomach ulcers 2013  ? Hyperlipidemia   ? Hypertension   ? Hypertensive heart disease   ? Ischemic cardiomyopathy   ? a. 10/2014 Echo: EF 40-45%.  ? Myocardial infarction ([facility]   ? PVD (peripheral vascular disease) ([facility]   ? Type II diabetes mellitus ([facility]   ? ? ?Past Surgical History:  ?Procedure Laterality Date  ? CARDIAC CATHETERIZATION  2003;   ? CARDIAC CATHETERIZATION N/A 11/07/2014  ? Left Heart Cath; HBelva Crome MD;   ? CATARACT EXTRACTION, BILATERAL Bilateral   ? CHOLECYSTECTOMY N/A 05/20/2015  ? Procedure: LAPAROSCOPIC CHOLECYSTECTOMY;  Surgeon: DCoralie Keens MD;  [facility]  Service: General;  Laterality: N/A;  ? COLONOSCOPY    ? COLONOSCOPY N/A 05/12/2015  ? Procedure: COLONOSCOPY;  Surgeon: DMilus Banister MD;  [facility]  Service: Endoscopy;  Laterality: N/A;  ? CORONARY ANGIOPLASTY  1998  ? /notes 07/13/2010  ? CORONARY ARTERY BYPASS GRAFT  2003  ? CABG X5  ? ESOPHAGOGASTRODUODENOSCOPY  06/10/2011  ? Procedure: ESOPHAGOGASTRODUODENOSCOPY (EGD);  Surgeon: MLadene Artist MD,FACG;  [facility];  Service: Endoscopy;  Laterality: N/A;  ? ESOPHAGOGASTRODUODENOSCOPY N/A 10/04/2016  ? Procedure: ESOPHAGOGASTRODUODENOSCOPY (  EGD);  Surgeon: Manus Gunning, MD;  [facility];  Service: Gastroenterology;  Laterality: N/A;  ? ? ?Current Medications: ?Current Meds  ?Medication Sig  ? Accu-Chek Softclix Lancets lancets TEST BLOOD SUGAR EVERY DAY  ? acetaminophen (TYLENOL) 500 MG tablet Take 500 mg by mouth every 8 (eight) hours as needed for mild pain or headache.  ? Alcohol Swabs (ALCOHOL PREP) PADS USE TO TEST BLOOD GLUCOSE THREE TIMES DAILY  ? amLODipine (NORVASC) 10 MG tablet TAKE 1 TABLET EVERY DAY  ? aspirin EC 81 MG tablet Take  81 mg by mouth at bedtime.  ? atorvastatin (LIPITOR) 80 MG tablet TAKE 1 TABLET EVERY DAY  AT  6PM  ? BD PEN NEEDLE NANO U/F 32G X 4 MM MISC INJECT TWO TIMES DAILY AS NEEDED.  ? bisacodyl (DULCOLAX) 5 MG EC tablet Take 5 mg by mouth daily as needed for moderate constipation.  ? Blood Glucose Monitoring Suppl (ACCU-CHEK AVIVA PLUS) w/Device KIT USE AS DIRECTED  ? brimonidine (ALPHAGAN) 0.2 % ophthalmic solution Place 1 drop into both eyes 2 (two) times daily.  ? carvedilol (COREG) 12.5 MG tablet Take 1 tablet (12.5 mg total) by mouth 2 (two) times daily.  ? cloNIDine (CATAPRES) 0.1 MG tablet TAKE 1 TABLET AT BEDTIME  ? COLCRYS 0.6 MG tablet TAKE 1 TABLET EVERY DAY  ? dorzolamide-timolol (COSOPT) 22.3-6.8 MG/ML ophthalmic solution Place 1 drop into both eyes 2 (two) times daily.  ? Ferrous Sulfate (IRON) 325 (65 Fe) MG TABS Take 1 tablet by mouth in the morning, at noon, and at bedtime.  ? furosemide (LASIX) 80 MG tablet Take 1 tablet (80 mg total) by mouth daily. Please keep upcoming appointment in march 2023 for future refills. Thank you  ? glucose blood (ACCU-CHEK GUIDE) test strip TEST BLOOD SUGAR EVERY DAY  ? hydrALAZINE (APRESOLINE) 25 MG tablet TAKE 1 TABLET (25 MG TOTAL) BY MOUTH 3 (THREE) TIMES DAILY.  ? insulin aspart (NOVOLOG) 100 UNIT/ML FlexPen Inject 5 Units into the skin 3 (three) times daily with meals.  ? insulin glargine (LANTUS SOLOSTAR) 100 UNIT/ML Solostar Pen Inject 10-12 Units into the skin daily.  ? isosorbide mononitrate (IMDUR) 60 MG 24 hr tablet Take 1 tablet (60 mg total) by mouth in the morning and at bedtime.  ? Lancet Devices (ACCU-CHEK SOFTCLIX) lancets USE TO TEST BLOOD GLUCOSE THREE TIMES DAILY  ? latanoprost (XALATAN) 0.005 % ophthalmic solution Place 1 drop into both eyes at bedtime.   ? nitroGLYCERIN (NITROSTAT) 0.4 MG SL tablet Place 1 tablet (0.4 mg total) under the tongue every 5 (five) minutes as needed for chest pain (3 doses MAX).  ? omeprazole (PRILOSEC) 20 MG capsule TAKE 1  CAPSULE EVERY DAY  ? OVER THE COUNTER MEDICATION Take 1 tablet by mouth daily. Prostate formula  ? pentoxifylline (TRENTAL) 400 MG CR tablet TAKE 1 TABLET TWICE DAILY AT 10AM AND 5PM  ? potassium chloride SA (KLOR-CON M) 20 MEQ tablet TAKE 1 TABLET EVERY DAY  ? timolol (TIMOPTIC) 0.5 % ophthalmic solution Place 1 drop into both eyes in the morning and at bedtime.  ? ?Current Facility-Administered Medications for the 06/16/21 encounter (Office Visit) with Belva Crome, MD  ?Medication  ? cyanocobalamin ((VITAMIN B-12)) injection 1,000 mcg  ?  ? ?Allergies:   Patient has no known allergies.  ? ?Social History  ? ?Socioeconomic History  ? Marital status: Widowed  ?  Spouse name: Not on file  ? Number of children: Not on file  ?  Years of education: Not on file  ? Highest education level: Not on file  ?Occupational History  ? Occupation: Retired  ?Tobacco Use  ? Smoking status: Former  ?  Packs/day: 0.50  ?  Years: 20.00  ?  Pack years: 10.00  ?  Types: Cigarettes  ? Smokeless tobacco: Never  ? Tobacco comments:  ?  "quit smoking cigarettes in the 1960s   ?Vaping Use  ? Vaping Use: Never used  ?Substance and Sexual Activity  ? Alcohol use: No  ? Drug use: No  ? Sexual activity: Not Currently  ?Other Topics Concern  ? Not on file  ?Social History Narrative  ? Pt lives alone, family nearby.  ? ?Social Determinants of Health  ? ?Financial Resource Strain: Not on file  ?Food Insecurity: Not on file  ?Transportation Needs: Not on file  ?Physical Activity: Not on file  ?Stress: Not on file  ?Social Connections: Not on file  ?  ? ?Family History: ?The patient's family history includes Cancer in his mother; Coronary artery disease in an other family member; Diabetes in an other family member; Heart attack in his sister; Hypertension in his mother and sister; Stroke in his mother. There is no history of Colon cancer. ? ?ROS:   ?Please see the history of present illness.    ?He became tearful during the conversation.  His son was  very understanding of the encounter.  The patient lives alone and he is at risk for having sudden incapacitation without anyone around.  I encouraged them all other systems reviewed and are negative. ? ?

## 2021-06-16 NOTE — Telephone Encounter (Signed)
Called [facility] and obtained fax number to send over referral.  Fax number is [phone].   ?

## 2021-06-16 NOTE — Telephone Encounter (Signed)
[name] (pt son) called stating if Dr. Jerilee Hoh can authorize home services for home health and home aide for pt. Would like for [place] to call when it is placed. [name] [phone]. ? ?Please advise.  ?

## 2021-06-16 NOTE — Patient Instructions (Signed)
Medication Instructions:  ?Your physician recommends that you continue on your current medications as directed. Please refer to the Current Medication list given to you today. ? ?*If you need a refill on your cardiac medications before your next appointment, please call your pharmacy* ? ? ?Lab Work: ?None ?If you have labs (blood work) drawn today and your tests are completely normal, you will receive your results only by: ?MyChart Message (if you have MyChart) OR ?A paper copy in the mail ?If you have any lab test that is abnormal or we need to change your treatment, we will call you to review the results. ? ? ?Testing/Procedures: ?None ? ? ?Follow-Up: ?At [facility], you and your health needs are our priority.  As part of our continuing mission to provide you with exceptional heart care, we have created designated Provider Care Teams.  These Care Teams include your primary Cardiologist (physician) and Advanced Practice Providers (APPs -  Physician Assistants and Nurse Practitioners) who all work together to provide you with the care you need, when you need it. ? ?We recommend signing up for the patient portal called "MyChart".  Sign up information is provided on this After Visit Summary.  MyChart is used to connect with patients for Virtual Visits (Telemedicine).  Patients are able to view lab/test results, encounter notes, upcoming appointments, etc.  Non-urgent messages can be sent to your provider as well.   ?To learn more about what you can do with MyChart, go to [email or web address].   ? ?Your next appointment:   ?4-6 month(s) ? ?The format for your next appointment:   ?In Person ? ?Provider:   ?[name], MD  ? ? ?Other Instructions ? ? ?Important Information About Sugar ? ? ? ? ?  ?

## 2021-06-17 ENCOUNTER — Telehealth: Payer: Self-pay

## 2021-06-17 ENCOUNTER — Other Ambulatory Visit: Payer: Self-pay | Admitting: Gastroenterology

## 2021-06-17 NOTE — Telephone Encounter (Signed)
Attempted to contact patient to schedule a Palliative Care consult appointment. No answer left a message to return call.  

## 2021-06-17 NOTE — Telephone Encounter (Signed)
Referral placed.

## 2021-06-22 ENCOUNTER — Telehealth: Payer: Self-pay

## 2021-06-22 NOTE — Telephone Encounter (Signed)
Spoke with patient's son [name] regarding Palliative Care services. Both patient's sons live out of state. Scheduled Palliative consult in person on 07/08/21 @ 11 AM.  ? ?

## 2021-06-25 ENCOUNTER — Telehealth: Payer: Self-pay | Admitting: Internal Medicine

## 2021-06-25 DIAGNOSIS — E119 Type 2 diabetes mellitus without complications: Secondary | ICD-10-CM

## 2021-06-25 DIAGNOSIS — R269 Unspecified abnormalities of gait and mobility: Secondary | ICD-10-CM

## 2021-06-25 DIAGNOSIS — R531 Weakness: Secondary | ICD-10-CM

## 2021-06-25 NOTE — Telephone Encounter (Signed)
[name] with [facility] called to get verbal orders to see patient for disease management, frequency one week four. ? ? ?Jackelyn Poling would like clarification on how much insulin to take and when. Jackelyn Poling states that patient does not know and needs concrete guidelines. ? ? ? ?Good callback number is [phone] ?Okay to leave voicemail  ?

## 2021-06-25 NOTE — Telephone Encounter (Signed)
Pt's son calling in regarding his father's home health. System shows the palliative care referral, son is requesting a referral for a home health aid (cooking , cleaning etc).  ?

## 2021-06-28 ENCOUNTER — Telehealth: Payer: Self-pay | Admitting: Internal Medicine

## 2021-06-28 DIAGNOSIS — R269 Unspecified abnormalities of gait and mobility: Secondary | ICD-10-CM

## 2021-06-28 DIAGNOSIS — Z794 Long term (current) use of insulin: Secondary | ICD-10-CM

## 2021-06-28 NOTE — Telephone Encounter (Addendum)
Pt son larry is calling and checking on home health aid referral. Pt has been sch for OT, PT, etc.. I see the referral and I told son someone will be back in touch. The referral was place on 06-17-2021. Pt son is calling back and [facility] not provider  home health aid ?

## 2021-06-29 ENCOUNTER — Telehealth: Payer: Self-pay | Admitting: Interventional Cardiology

## 2021-06-29 NOTE — Telephone Encounter (Signed)
[name] calling back for a reply and requests a callback regarding patient insulin at [phone] asap.  ?

## 2021-06-29 NOTE — Telephone Encounter (Signed)
Returned call to home health nurse.  She was calling because the BP diastolic reading that she obtained was 42.  ? ?Advised to continue to monitor.  ?

## 2021-06-29 NOTE — Telephone Encounter (Signed)
Pt c/o BP issue: STAT if pt c/o blurred vision, one-sided weakness or slurred speech ? ?1. What are your last 5 BP readings? 128/42 today HR 61 irregular ? ?2. Are you having any other symptoms (ex. Dizziness, headache, blurred vision, passed out)? Feels down and drowsy  ? ?3. What is your BP issue? Erica from [facility] states the patient's BP was 128/42 today and his HR was 61 and irregular. Phone: [phone] ?

## 2021-06-29 NOTE — Telephone Encounter (Signed)
Spoke with Aletta Edouard RN.  Patient has not been checking his glucose and not taking his insuline regularly.  She is worried that it may not be safe on insulin.  She is also worried about him staying home on his own. ?Please advise. ?

## 2021-06-29 NOTE — Telephone Encounter (Signed)
Referral was placed.  [name] is aware. ?

## 2021-06-30 ENCOUNTER — Telehealth: Payer: Self-pay | Admitting: *Deleted

## 2021-06-30 ENCOUNTER — Ambulatory Visit (INDEPENDENT_AMBULATORY_CARE_PROVIDER_SITE_OTHER): Payer: Medicare HMO | Admitting: *Deleted

## 2021-06-30 DIAGNOSIS — I10 Essential (primary) hypertension: Secondary | ICD-10-CM

## 2021-06-30 DIAGNOSIS — N185 Chronic kidney disease, stage 5: Secondary | ICD-10-CM

## 2021-06-30 DIAGNOSIS — E119 Type 2 diabetes mellitus without complications: Secondary | ICD-10-CM

## 2021-06-30 LAB — CBC AND DIFFERENTIAL: Hemoglobin: 9.3 — AB (ref 13.5–17.5)

## 2021-06-30 LAB — BASIC METABOLIC PANEL
BUN: 75 — AB (ref 4–21)
CO2: 14 (ref 13–22)
Glucose: 182

## 2021-06-30 LAB — COMPREHENSIVE METABOLIC PANEL: eGFR: 15

## 2021-06-30 NOTE — Chronic Care Management (AMB) (Signed)
?  Chronic Care Management ?Note ? ?06/30/2021 ?Name: [name] MRN: [id number] DOB: [birth year] ? ?[name] is a 88 y.o. year old male who is a primary care patient of [name], [name], MD and is actively engaged with the care management team. I reached out to [name] by phone today to assist with scheduling an initial visit with the RN Case Manager ? ?Follow up plan: ?Telephone appointment with care management team member scheduled for:06/30/21 ? ?Laverda Sorenson  ?Care Guide, Embedded Care Coordination ?[facility] Management  ?Direct Dial: [phone] ? ?

## 2021-06-30 NOTE — Chronic Care Management (AMB) (Signed)
?Chronic Care Management  ? ?CCM RN Visit Note ? ?06/30/2021 ?Name: [name] MRN: [id number] DOB: 08-Apr-1931 ? ?Subjective: ?[name] is a 88 y.o. year old male who is a primary care patient of [name], [name], MD. The care management team was consulted for assistance with disease management and care coordination needs.   ? ?Engaged with patient by telephone for follow up visit in response to provider referral for case management and/or care coordination services.  ? ?Consent to Services:  ?The patient was given information about Chronic Care Management services, agreed to services, and gave verbal consent prior to initiation of services.  Please see initial visit note for detailed documentation.  ? ?Patient agreed to services and verbal consent obtained.  ? ?Assessment: Review of patient past medical history, allergies, medications, health status, including review of consultants reports, laboratory and other test data, was performed as part of comprehensive evaluation and provision of chronic care management services.  ? ?SDOH (Social Determinants of Health) assessments and interventions performed:  ?SDOH Interventions   ? ?Flowsheet Row Most Recent Value  ?SDOH Interventions   ?Food Insecurity Interventions Intervention Not Indicated  ?Housing Interventions Intervention Not Indicated  ?Social Connections Interventions Intervention Not Indicated  ?Transportation Interventions Intervention Not Indicated  ? ?  ?  ? ?[place] ? ?No Known Allergies ? ?Outpatient Encounter Medications as of 06/30/2021  ?Medication Sig  ? Accu-Chek Softclix Lancets lancets TEST BLOOD SUGAR EVERY DAY  ? acetaminophen (TYLENOL) 500 MG tablet Take 500 mg by mouth every 8 (eight) hours as needed for mild pain or headache.  ? Alcohol Swabs (ALCOHOL PREP) PADS USE TO TEST BLOOD GLUCOSE THREE TIMES DAILY  ? amLODipine (NORVASC) 10 MG tablet TAKE 1 TABLET EVERY DAY  ? aspirin EC 81 MG tablet Take 81 mg by mouth  at bedtime.  ? atorvastatin (LIPITOR) 80 MG tablet TAKE 1 TABLET EVERY DAY  AT  6PM  ? BD PEN NEEDLE NANO U/F 32G X 4 MM MISC INJECT TWO TIMES DAILY AS NEEDED.  ? bisacodyl (DULCOLAX) 5 MG EC tablet Take 5 mg by mouth daily as needed for moderate constipation.  ? Blood Glucose Monitoring Suppl (ACCU-CHEK AVIVA PLUS) w/Device KIT USE AS DIRECTED  ? brimonidine (ALPHAGAN) 0.2 % ophthalmic solution Place 1 drop into both eyes 2 (two) times daily.  ? carvedilol (COREG) 12.5 MG tablet Take 1 tablet (12.5 mg total) by mouth 2 (two) times daily.  ? cloNIDine (CATAPRES) 0.1 MG tablet TAKE 1 TABLET AT BEDTIME  ? COLCRYS 0.6 MG tablet TAKE 1 TABLET EVERY DAY  ? dorzolamide-timolol (COSOPT) 22.3-6.8 MG/ML ophthalmic solution Place 1 drop into both eyes 2 (two) times daily.  ? Ferrous Sulfate (IRON) 325 (65 Fe) MG TABS Take 1 tablet by mouth in the morning, at noon, and at bedtime.  ? furosemide (LASIX) 80 MG tablet Take 1 tablet (80 mg total) by mouth daily. Please keep upcoming appointment in march 2023 for future refills. Thank you  ? glucose blood (ACCU-CHEK GUIDE) test strip TEST BLOOD SUGAR EVERY DAY  ? hydrALAZINE (APRESOLINE) 25 MG tablet TAKE 1 TABLET (25 MG TOTAL) BY MOUTH 3 (THREE) TIMES DAILY.  ? insulin aspart (NOVOLOG) 100 UNIT/ML FlexPen Inject 5 Units into the skin 3 (three) times daily with meals.  ? insulin glargine (LANTUS SOLOSTAR) 100 UNIT/ML Solostar Pen Inject 10-12 Units into the skin daily.  ? isosorbide mononitrate (IMDUR) 60 MG 24 hr tablet Take 1 tablet (60 mg total) by  mouth in the morning and at bedtime.  ? Lancet Devices (ACCU-CHEK SOFTCLIX) lancets USE TO TEST BLOOD GLUCOSE THREE TIMES DAILY  ? latanoprost (XALATAN) 0.005 % ophthalmic solution Place 1 drop into both eyes at bedtime.   ? nitroGLYCERIN (NITROSTAT) 0.4 MG SL tablet Place 1 tablet (0.4 mg total) under the tongue every 5 (five) minutes as needed for chest pain (3 doses MAX).  ? omeprazole (PRILOSEC) 20 MG capsule Take 1 capsule (20 mg  total) by mouth daily. **PLEASE CONTACT OFFICE TO SCHEDULE AN APPOINTMENT  ? OVER THE COUNTER MEDICATION Take 1 tablet by mouth daily. Prostate formula  ? pentoxifylline (TRENTAL) 400 MG CR tablet TAKE 1 TABLET TWICE DAILY AT 10AM AND 5PM  ? potassium chloride SA (KLOR-CON M) 20 MEQ tablet TAKE 1 TABLET EVERY DAY  ? timolol (TIMOPTIC) 0.5 % ophthalmic solution Place 1 drop into both eyes in the morning and at bedtime.  ? ?Facility-Administered Encounter Medications as of 06/30/2021  ?Medication  ? cyanocobalamin ((VITAMIN B-12)) injection 1,000 mcg  ? ? ?Patient Active Problem List  ? Diagnosis Date Noted  ? Iron deficiency anemia due to chronic blood loss 07/09/2019  ? Personal history of arterial venous malformation (AVM) 07/09/2019  ? Acute on chronic respiratory failure with hypoxia (HCC)   ? Acute exacerbation of CHF (congestive heart failure) ([place]) 08/11/2018  ? Gout due to renal impairment involving toe 02/08/2017  ? Insulin dependent diabetes mellitus   ? Chronic combined systolic and diastolic heart failure ([place])   ? Acute blood loss anemia 05/10/2015  ? CKD (chronic kidney disease) stage 5, GFR less than 15 ml/min (HCC)   ? B12 deficiency 06/27/2011  ? Peripheral vascular disease ([place]) 01/19/2007  ? Peptic ulcer disease- 2013 01/19/2007  ? Diabetes mellitus type 2, insulin dependent ([place]) 10/02/2006  ? Dyslipidemia 10/02/2006  ? Essential hypertension 10/02/2006  ? Coronary artery disease involving 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 ([place]) 10/02/2006  ? BENIGN PROSTATIC HYPERTROPHY 10/02/2006  ? ? ?Conditions to be addressed/monitored:HTN, DMII, and CKD Stage 3 ? ?Care Plan : [facility] Care Plan  ?Updates made by Ilean China, RN since 06/30/2021 12:00 AM  ?  ? ?Problem: Chronic Disease Managment Needs Related to HTN, CKD and Diabetes   ?Priority: High  ?Onset Date: 06/30/2021  ?  ? ?Long-Range Goal: Patient will Work with Consulting civil engineer Regarding Care Management and Reading with HTN, DM, and CKD   ?Start Date: 06/30/2021  ?Expected End Date: 07/01/2022  ?This Visit's Progress: On track  ?Priority: High  ?Note:   ?Current Barriers:  ?Chronic Disease Management support and education needs related to HTN, DMII, and CKD Stage 4 ? ?RNCM Clinical Goal(s):  ?Patient will verbalize basic understanding of DMII disease process and self health management plan as evidenced by self reported blood sugar readings within normal range and an A1C < 7 ?continue to work with Consulting civil engineer and/or Social Worker to address care management and care coordination needs related to HTN, DMII, and CKD Stage 3 as evidenced by adherence to CM Team Scheduled appointments     through collaboration with PharmD, provider, and care team.  ? ?Interventions: ?1:1 collaboration with primary care provider regarding development and update of comprehensive plan of care as evidenced by provider attestation and co-signature ?Inter-disciplinary care team collaboration (see longitudinal plan of care) ?Evaluation of current treatment plan related to  self management and patient's adherence to plan as established by provider ?Discussed mobility  and ability to perform ADLs. Patient is able to drive himself. He is able to prepare meals and perform other ADLs independently. He does have a cane that he uses as needed.  ?Discussed family/social support. Primary support is granddaughter. She is available and stable.  ? ? ?Diabetes:  (Status: New goal.) Long Term Goal  ?Lab Results  ?Component Value Date  ? HGBA1C 6.8 (A) 04/15/2021  ? HGBA1C 6.5 (A) 01/13/2021  ? HGBA1C 6.0 (A) 10/09/2020  ? ?Lab Results  ?Component Value Date  ? MICROALBUR 30.8 (H) 08/02/2017  ? [place] 61 11/21/2018  ? CREATININE 4.16 (H) 04/15/2021  ?Assessed patient's understanding of A1c goal: <7% ?Provided education to patient about basic DM disease process; ?Reviewed medications with patient and discussed importance of medication adherence;         ?Reviewed prescribed diet with patient ADA carb modified diet; ?Counseled on importance of regular laboratory monitoring as prescribed;        ?Discussed plans with patient for ongoing care management follow up an

## 2021-06-30 NOTE — Patient Instructions (Signed)
Visit Information  ? ?Thank you for taking time to visit with me today. Please don't hesitate to contact me if I can be of assistance to you before our next scheduled telephone appointment. ? ?Following are the goals we discussed today:  ?Patient Goals/Self-Care Activities: ?Take medications as prescribed   ?Attend all scheduled provider appointments ?Attend church or other social activities ?Perform all self care activities independently  ?Perform IADL's (shopping, preparing meals, housekeeping, managing finances) independently ?Call provider office for new concerns or questions  ?schedule appointment with eye doctor ?check blood sugar at prescribed times: before meals and at bedtime, when you have symptoms of low or high blood sugar, and before and after exercise ?enter blood sugar readings and medication or insulin into daily log ?take the blood sugar log to all doctor visits ?keep feet up while sitting ?wash and dry feet carefully every day ?wear comfortable, well-fitting shoes ?check blood pressure 3 times per week ?write blood pressure results in a log or diary ?take blood pressure log to all doctor appointments ?report new symptoms to your doctor ?Call RN Care Manager as needed ? ?Our next appointment is by telephone on 08/05/21 at 1:00 ? ?Please call the care guide team at [phone] if you need to cancel or reschedule your appointment.  ? ?If you are experiencing a Mental Health or [place] or need someone to talk to, please go to [facility] [address] [place] [phone]) ?call [phone]  ? ? ?The patient verbalized understanding of instructions, educational materials, and care plan provided today and declined offer to receive copy of patient instructions, educational materials, and care plan.  ? ?Telephone follow up appointment with care management team member scheduled for: 08/05/21 with RNCM ?The patient has been provided with contact information  for the care management team and has been advised to call with any health related questions or concerns.  ? ?Chong Sicilian, BSN, RN-BC ?Embedded Chronic Care Manager ?[place] [facility] [place] [facility] ?Direct Dial: [phone] ? ?  ?

## 2021-07-01 ENCOUNTER — Telehealth: Payer: Self-pay | Admitting: Interventional Cardiology

## 2021-07-01 NOTE — Telephone Encounter (Signed)
Pt son is calling checking on the status of home health aid ?

## 2021-07-01 NOTE — Telephone Encounter (Signed)
Patient's son is requesting to speak with Dr. Thompson Caul nurse regarding developing DNR and how to move forward with patient due to his declining condition--becoming substantially weak and weakening kidneys. Patient's son states he has a few meetings scheduled for this afternoon and if he is unavailable he is requesting to have his call returned to his brother, Merry Proud, at [phone]. ?

## 2021-07-01 NOTE — Telephone Encounter (Signed)
New referral placed.

## 2021-07-02 NOTE — Telephone Encounter (Signed)
Spoke with patient's son [name] regarding DNR. ? ?[name] asked how they would go about obtaining a DNR for the patient (his father). Explained to [name] that the patient's PCP can provide the forms and sign them for the patient/family to have.  ? ?[name] states palliative care is scheduled to come to the home on 07/08/21 and asked if they could help with the forms. Explained that they could provide patient/family with more information and may have forms with them, but they may still need to follow-up with PCP to get a DNR filled out and signed. ? ?[name] states patient's health continues to decline, which is why palliative care referral was made. [name] asked if Dr. Tamala Julian had any idea of patient's prognosis, if he could estimate the amount of time patient had left. Explained that it is difficult to give that kind of estimate and that Dr. Tamala Julian may not be able to give that, but will forward to ask Dr. Tamala Julian if he has anything to add. ? ?[name] verbalized understanding and expressed appreciation for returning call. ?

## 2021-07-05 ENCOUNTER — Telehealth: Payer: Self-pay | Admitting: Internal Medicine

## 2021-07-05 NOTE — Telephone Encounter (Signed)
Home Health referral placed

## 2021-07-05 NOTE — Telephone Encounter (Signed)
Patient's son calling again regarding getting home health aid assistance for his father. States the other lines of care are all set up, still having an issue with getting the home health aid set up. Please call larry at [phone] asap, he is frustrated and his father's health is declining ?

## 2021-07-06 NOTE — Telephone Encounter (Signed)
Phone call was directed to the practice administrator due to the son being upset about the referral. I explained to the son this was a part of the referral process and when the referral is denied sometimes it has to be resubmitted, and that the referral coordinator was looking into this and would give him a call on tomorrow. [name] verbalized understanding.   ?

## 2021-07-07 ENCOUNTER — Telehealth: Payer: Self-pay | Admitting: Internal Medicine

## 2021-07-07 NOTE — Telephone Encounter (Signed)
Pt son chauncey is calling and would like md to return his call concerning his dad. Pt will be flying in today ?

## 2021-07-08 ENCOUNTER — Encounter: Payer: Self-pay | Admitting: Internal Medicine

## 2021-07-08 ENCOUNTER — Other Ambulatory Visit: Payer: Self-pay | Admitting: Internal Medicine

## 2021-07-08 VITALS — BP 130/60 | HR 54 | Temp 97.7°F | Resp 18 | Wt 137.2 lb

## 2021-07-08 DIAGNOSIS — Z515 Encounter for palliative care: Secondary | ICD-10-CM | POA: Diagnosis not present

## 2021-07-08 DIAGNOSIS — I25709 Atherosclerosis of coronary artery bypass graft(s), unspecified, with unspecified angina pectoris: Secondary | ICD-10-CM

## 2021-07-08 DIAGNOSIS — I739 Peripheral vascular disease, unspecified: Secondary | ICD-10-CM

## 2021-07-08 DIAGNOSIS — N185 Chronic kidney disease, stage 5: Secondary | ICD-10-CM | POA: Diagnosis not present

## 2021-07-08 DIAGNOSIS — I5042 Chronic combined systolic (congestive) and diastolic (congestive) heart failure: Secondary | ICD-10-CM

## 2021-07-08 DIAGNOSIS — R2681 Unsteadiness on feet: Secondary | ICD-10-CM | POA: Diagnosis not present

## 2021-07-08 NOTE — Progress Notes (Signed)
[facility] Palliative Care Consult Note Telephone: [phone]  Fax: [phone]   Date of encounter: 07/08/21 7:07 AM PATIENT NAME: [name] [address] [place] [address]   [phone] (home)  DOB: 1931-11-11 MRN: [id number] PRIMARY CARE PROVIDER:    Isaac Holt, Brandon Halsted, MD,  [place] [address] [phone]  REFERRING PROVIDER:   Isaac Holt, Brandon Halsted, MD [place],  [place] [address] [phone]  RESPONSIBLE PARTY:    Contact Information     Name Relation Home Work Mobile   [name] Son   [phone]   [name] Niece   [phone]   [name] Granddaughter   [phone]   [name], [name] [phone]          I met face to face with patient and family in his home/facility. Palliative Care was asked to follow this patient by consultation request of  [name]* to address advance care planning and complex medical decision making. This is the initial visit.                                     ASSESSMENT AND PLAN / RECOMMENDATIONS:   Advance Care Planning/Goals of Care: Goals include to maximize quality of life and symptom management. Patient/health care surrogate gave his/her permission to discuss.Our advance care planning conversation included a discussion about:    The value and importance of advance care planning  Experiences with loved ones who have been seriously ill or have died  Exploration of personal, cultural or spiritual beliefs that might influence medical decisions  Exploration of goals of care in the event of a sudden injury or illness  Identification  of a healthcare agent--sons, [name] and [name] Review and updating or creation of an  advance directive document . Decision not to resuscitate or to de-escalate disease focused treatments due to poor prognosis. CODE STATUS:  TBD at next visit  Symptom  Management/Plan: 1. Chronic combined systolic and diastolic heart failure (HCC) -pt with EF 30-35% down from 35-40% a year and half ago plus grd 3 diastolic dysfunction and akinesis of LV/entire inferior wall, also reduced RV function and severely elevated PA systolic pressure to 73.4LPFX, dyspnea on exertion walking across a room (but not currently hypoxic), 2+ pitting edema in context of weight loss and poor intake plus CKD5/cardiorenal syndrome and CAD that cannot be treated with stenting or angioplasty to prior bypass grafts at this point -pt with poor prognosis of less than 6 mos per Dr. Tamala Holt and Dr. Jerilee Holt -does not have angina -has cachexia, protein calorie malnutrition, progressive debility  2. CKD (chronic kidney disease) stage 5, GFR less than 15 ml/min (HCC) -not a candidate for HD and would not want, denies uremic symptoms outside of fatigue and dyspnea  -avoiding nsaids and other nephrotoxins -complicated by iron deficiency, b12 deficiency and anemia of CKD contributing to fatigue/malaise  3. Coronary artery disease involving 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 ([place]) -bypass graft failure with only remaining conduit LIMA to LAD by cath in September 2016 and now CHF as above  -not amenable to treatments at this time  4. Peripheral vascular disease ([place]) -also an issue, on trental, only walks short distances   5. Unsteady gait -was resistant to getting up and walking much, had stooped posture and appeared overwhelmingly fatigued--is to be getting PT, RN visits but  he's oddly unsure if these have occurred (on calendar), does not yet have walker as of today -needs assistance with cleaning, cooking and errands--is still bathing and dressing though slowly and unsteadily so would benefit from assistance there, as well, mostly sits and sleeps through the day, somewhat depressed, but declining meds for this   6. Palliative care by specialist -pt did not want to  make any decisions about his care -he's down that there are no interventions to prolong his life -I spoke with his son, [name], and family is in favor of quality of life and getting the CNA offered by [name]; however, they've had difficulty getting this started -discussed prognosis of 6 mos or less given weight loss and functional decline -set up visit to do [name] form next week when [name] and [name] are both in town with their dad to see him again and help make arrangements for further care.   Follow up Palliative Care Visit: Palliative care will continue to follow for complex medical decision making, advance care planning, and clarification of goals. 07/12/21 [name]/ACP call with sons.   This visit was coded based on medical decision making (MDM). 37 mins spent on ACP  PPS: 40%  HOSPICE ELIGIBILITY/DIAGNOSIS: Appears so/chronic combined heart failure due to coronary artery disease with CKD5 related to cardiorenal syndrome  Chief Complaint: initial palliative consult  HISTORY OF PRESENT ILLNESS:  [name] is a 88 y.o. year old male  with CAD with failed bypass grafts except one as of last cath, mixed chf with declining EF, CKD5, anemia, PAD, and DMII as well as blindness of one eye seen in his home for initial palliative consult at the request of Dr. Daneen Holt.     He had been going to the gym twice a week until 2 months ago.  He is now winded sitting at the table after greeting me at the door 5 minutes ago.   He's not doing anything now.  Not sleeping a lot.  His appetite is kind of bad.   He ate a pretty regular diet before 2 months ago.  He cooked for himself--ate 2 meals per day.  Might sometimes have 2 meals if he wants to.  Not usually full meals anymore.  Eats about 50% of what he once did.  Does eat some snacks.  He is drinking ensure some--has about one a day.  No difficulty chewing or swallowing.  No he does not have much swelling.  Shortness of breath is about the same  for 2 months.  Anytime he walks through the house he's sob.  He's able to get a shower without sob.  No coughing.  No PND.    Does urinate a few times in the day.  Twice at night.   No pruritus.    He will still do his am routine, but he's tired.  He does rest pretty well at night.    His granddaughter has graduated.  His family is in [place].  Other son is in [place].    He could benefit from some help with meal prep and cleaning the home.    He says he's doing pretty well at keeping up with his medicines and checking his sugars.    Wt has been 140-144.  He was 152 lbs last year.    He'd had HH coming out with a nurse and PT.  History obtained from review of EMR, discussion with primary team, and interview with family, facility staff/caregiver and/or [name].  I reviewed available labs, medications, imaging, studies and related documents from the EMR.  Records reviewed and summarized above.   ROS General: NAD EYES: denies vision changes ENMT: denies dysphagia Cardiovascular: denies chest pain, has DOE Pulmonary: has cough and hoarseness, has increased SOB Abdomen: endorses poor appetite and not able to prepare own food, denies constipation, endorses continence of bowel GU: denies dysuria, endorses continence of urine MSK:  endorses increased weakness,  2 falls reported Skin: denies rashes or wounds Neurological: denies pain, denies insomnia Psych: admits to depression but declines meds Heme/lymph/immuno: denies bruises, abnormal bleeding  Physical Exam: Current and past weights: 144.8 lbs with BMI 20 06/16/21 but 137.2 lbs today Constitutional: NAD except after walking short distance is sob and very fatigued--puts hand on head in lap General: frail appearing, thin EYES: anicteric sclera, lids intact, no discharge, glasses, blind in one eye ENMT: intact hearing, oral mucous membranes moist, dentition intact CV: S1S2, RRR, 2+ edema Pulmonary: LCTA, increased work of breathing after  ambulating, very hoarse, dry cough, room air Abdomen: intake 25%, normo-active BS + 4 quadrants, soft and non tender, no ascites GU: deferred MSK: sarcopenia, moves all extremities, ambulatory but stooped posture, very slow and unsteady Skin: warm and dry, no rashes or wounds on visible skin Neuro:  generalized weakness,  no reported cognitive loss Psych: non-anxious affect, A and O x 3 but very slow to respond, flat affect Hem/lymph/immuno: no widespread bruising  CURRENT PROBLEM LIST:  Patient Active Problem List   Diagnosis Date Noted   Iron deficiency anemia due to chronic blood loss 07/09/2019   Personal history of arterial venous malformation (AVM) 07/09/2019   Acute on chronic respiratory failure with hypoxia (HCC)    Acute exacerbation of CHF (congestive heart failure) ([facility]) 08/11/2018   Gout due to renal impairment involving toe 02/08/2017   Insulin dependent diabetes mellitus    Chronic combined systolic and diastolic heart failure (HCC)    Acute blood loss anemia 05/10/2015   CKD (chronic kidney disease) stage 5, GFR less than 15 ml/min (HCC)    B12 deficiency 06/27/2011   Peripheral vascular disease ([place] [place]) 01/19/2007   Peptic ulcer disease- 2013 01/19/2007   Diabetes mellitus type 2, insulin dependent ([place]) 10/02/2006   Dyslipidemia 10/02/2006   Essential hypertension 10/02/2006   Coronary artery disease involving 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 ([place]) 10/02/2006   BENIGN PROSTATIC HYPERTROPHY 10/02/2006   PAST MEDICAL HISTORY:  Active Ambulatory Problems    Diagnosis Date Noted   Diabetes mellitus type 2, insulin dependent ([place]) 10/02/2006   Dyslipidemia 10/02/2006   Essential hypertension 10/02/2006   Coronary artery disease involving 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 ([place]) 10/02/2006   Peripheral vascular disease ([place]) 01/19/2007   Peptic ulcer disease- 2013 01/19/2007   BENIGN PROSTATIC HYPERTROPHY 10/02/2006    B12 deficiency 06/27/2011   CKD (chronic kidney disease) stage 5, GFR less than 15 ml/min (HCC)    Acute blood loss anemia 05/10/2015   Chronic combined systolic and diastolic heart failure (HCC)    Insulin dependent diabetes mellitus    Gout due to renal impairment involving toe 02/08/2017   Acute exacerbation of CHF (congestive heart failure) ([place]) 08/11/2018   Acute on chronic respiratory failure with hypoxia (HCC)    Iron deficiency anemia due to chronic blood loss 07/09/2019   Personal history of arterial venous malformation (AVM) 07/09/2019   Resolved Ambulatory Problems    Diagnosis Date Noted   Benign neoplasm  of colon 05/19/2008   Hypercalcemia 12/07/2007   MYOCARDIAL INFARCTION, HX OF 10/02/2006   COLITIS 05/19/2008   Disorder resulting from impaired renal function 10/02/2006   Anemia 06/09/2011   GI bleed 06/09/2011   Generalized weakness 06/09/2011   Nonspecific abnormal finding in stool contents 06/10/2011   Acute gastric ulcer with hemorrhage 06/10/2011   Chest pain 09/10/2014   Myocardial infarct-out of hospital 09/12/2014   Elevated troponin 11/06/2014   NSTEMI (non-ST elevated myocardial infarction) [facility])    Ischemic cardiomyopathy    Acute on chronic systolic CHF (congestive heart failure) ([place])    Type II diabetes mellitus ([place])    Hyperlipidemia    CAD (coronary artery disease) of bypass graft 12/29/2014   Dyspnea 12/29/2014   Sinus bradycardia 12/29/2014   Hypotension 05/10/2015   Acute on chronic kidney failure ([place]) 05/10/2015   Gastrointestinal bleeding 05/10/2015   Type 2 diabetes mellitus with microalbuminuria (HCC)    S/P CABG (coronary artery bypass graft)    Arm pain    CAD (coronary artery disease)    Epigastric pain 05/17/2015   Pain in the chest    CAD in native artery    Uncontrolled type 2 diabetes mellitus with diabetic nephropathy    Cardiomyopathy, ischemic    Acute on chronic renal failure (HCC)    Pedal edema    AP (abdominal  pain)    Chronic diastolic CHF (congestive heart failure) ([place])    Past Medical History:  Diagnosis Date   Arthritis    Atrophic gastritis    AVM (arteriovenous malformation) of duodenum, acquired    B12 deficiency anemia    Barrett's esophagus    Cataract    Chronic systolic CHF (congestive heart failure) (HCC)    CKD (chronic kidney disease), stage IV (HCC)    Diabetes 1.5, managed as type 1 ([place])    Hiatal hernia 2018   History of stomach ulcers 2013   Hypertension    Hypertensive heart disease    Myocardial infarction ([place])    PVD (peripheral vascular disease) ([place])    SOCIAL HX:  Social History   Tobacco Use   Smoking status: Former    Packs/day: 0.50    Years: 20.00    Pack years: 10.00    Types: Cigarettes   Smokeless tobacco: Never   Tobacco comments:    "quit smoking cigarettes in the 1960s   Substance Use Topics   Alcohol use: No   FAMILY HX:  Family History  Problem Relation Age of Onset   Stroke Mother    Hypertension Mother    Cancer Mother    Diabetes Other        brothers and sisters   Coronary artery disease Other    Hypertension Sister    Heart attack Sister    Colon cancer Neg Hx       ALLERGIES: No Known Allergies   PERTINENT MEDICATIONS:  Outpatient Encounter Medications as of 07/08/2021  Medication Sig   Accu-Chek Softclix Lancets lancets TEST BLOOD SUGAR EVERY DAY   acetaminophen (TYLENOL) 500 MG tablet Take 500 mg by mouth every 8 (eight) hours as needed for mild pain or headache.   Alcohol Swabs (ALCOHOL PREP) PADS USE TO TEST BLOOD GLUCOSE THREE TIMES DAILY   amLODipine (NORVASC) 10 MG tablet TAKE 1 TABLET EVERY DAY   aspirin EC 81 MG tablet Take 81 mg by mouth at bedtime.   atorvastatin (LIPITOR) 80 MG tablet TAKE 1 TABLET EVERY DAY  AT  [facility]  BD PEN NEEDLE NANO U/F 32G X 4 MM MISC INJECT TWO TIMES DAILY AS NEEDED.   bisacodyl (DULCOLAX) 5 MG EC tablet Take 5 mg by mouth daily as needed for moderate constipation.   Blood Glucose  Monitoring Suppl (ACCU-CHEK AVIVA PLUS) w/Device KIT USE AS DIRECTED   brimonidine (ALPHAGAN) 0.2 % ophthalmic solution Place 1 drop into both eyes 2 (two) times daily.   carvedilol (COREG) 12.5 MG tablet Take 1 tablet (12.5 mg total) by mouth 2 (two) times daily.   cloNIDine (CATAPRES) 0.1 MG tablet TAKE 1 TABLET AT BEDTIME   COLCRYS 0.6 MG tablet TAKE 1 TABLET EVERY DAY   dorzolamide-timolol (COSOPT) 22.3-6.8 MG/ML ophthalmic solution Place 1 drop into both eyes 2 (two) times daily.   Ferrous Sulfate (IRON) 325 (65 Fe) MG TABS Take 1 tablet by mouth in the morning, at noon, and at bedtime.   furosemide (LASIX) 80 MG tablet Take 1 tablet (80 mg total) by mouth daily. Please keep upcoming appointment in march 2023 for future refills. Thank you   glucose blood (ACCU-CHEK GUIDE) test strip TEST BLOOD SUGAR EVERY DAY   hydrALAZINE (APRESOLINE) 25 MG tablet TAKE 1 TABLET (25 MG TOTAL) BY MOUTH 3 (THREE) TIMES DAILY.   insulin aspart (NOVOLOG) 100 UNIT/ML FlexPen Inject 5 Units into the skin 3 (three) times daily with meals.   insulin glargine (LANTUS SOLOSTAR) 100 UNIT/ML Solostar Pen Inject 10-12 Units into the skin daily.   isosorbide mononitrate (IMDUR) 60 MG 24 hr tablet Take 1 tablet (60 mg total) by mouth in the morning and at bedtime.   Lancet Devices (ACCU-CHEK SOFTCLIX) lancets USE TO TEST BLOOD GLUCOSE THREE TIMES DAILY   latanoprost (XALATAN) 0.005 % ophthalmic solution Place 1 drop into both eyes at bedtime.    nitroGLYCERIN (NITROSTAT) 0.4 MG SL tablet Place 1 tablet (0.4 mg total) under the tongue every 5 (five) minutes as needed for chest pain (3 doses MAX).   omeprazole (PRILOSEC) 20 MG capsule Take 1 capsule (20 mg total) by mouth daily. **PLEASE CONTACT OFFICE TO SCHEDULE AN APPOINTMENT   OVER THE COUNTER MEDICATION Take 1 tablet by mouth daily. Prostate formula   pentoxifylline (TRENTAL) 400 MG CR tablet TAKE 1 TABLET TWICE DAILY AT 10AM AND 5PM   potassium chloride SA (KLOR-CON M)  20 MEQ tablet TAKE 1 TABLET EVERY DAY   timolol (TIMOPTIC) 0.5 % ophthalmic solution Place 1 drop into both eyes in the morning and at bedtime.   Facility-Administered Encounter Medications as of 07/08/2021  Medication   cyanocobalamin ((VITAMIN B-12)) injection 1,000 mcg   Thank you for the opportunity to participate in the care of Mr. Caban.  The palliative care team will continue to follow. Please call our office at [phone] if we can be of additional assistance.   Shafin Pollio, DO ,   COVID-19 PATIENT SCREENING TOOL Asked and negative response unless otherwise noted:  Have you had symptoms of covid, tested positive or been in contact with someone with symptoms/positive test in the past 5-10 days?

## 2021-07-09 ENCOUNTER — Other Ambulatory Visit: Payer: Self-pay | Admitting: Internal Medicine

## 2021-07-12 ENCOUNTER — Other Ambulatory Visit: Payer: Self-pay | Admitting: Internal Medicine

## 2021-07-12 ENCOUNTER — Encounter: Payer: Self-pay | Admitting: Internal Medicine

## 2021-07-12 DIAGNOSIS — Z7189 Other specified counseling: Secondary | ICD-10-CM

## 2021-07-12 DIAGNOSIS — I5042 Chronic combined systolic (congestive) and diastolic (congestive) heart failure: Secondary | ICD-10-CM | POA: Diagnosis not present

## 2021-07-12 DIAGNOSIS — N185 Chronic kidney disease, stage 5: Secondary | ICD-10-CM

## 2021-07-12 NOTE — Progress Notes (Signed)
? ? ?Manufacturing [facility] ?Community Palliative Care Consult Note ?Telephone: [phone]  ?Fax: [phone]  ? ? ?Date of encounter: 07/12/21 ?PATIENT NAME: [name] ?[place] [address]   ?[phone] (home)  ?DOB: Jan 11, 1932 ?MRN: [id number] ?PRIMARY CARE PROVIDER:    ?[name], Brandon Halsted, MD,  ?[place] ?[place] [address] ?[phone] ? ?REFERRING PROVIDER:   ?[name], Brandon Halsted, MD ?[place] ?[place],  [place] [address] ?[phone] ? ?RESPONSIBLE PARTY:    ?Contact Information   ? ? Name Relation Home Work Mobile  ? [name] Son   [phone]  ? [name] Niece   [phone]  ? [name] Granddaughter   [phone]  ? [name] [phone]    ? ?  ? ? ?Due to the COVID-19 crisis, this visit was done via telemedicine from my office and it was initiated and consent by this patient and or family. ? ?I connected with  [name] OR PROXY on 07/12/21 by a telemedicine application and verified that I am speaking with the correct person using two identifiers. ?  ?I discussed the limitations of evaluation and management by telemedicine. The patient expressed understanding and agreed to proceed. ? ?Palliative Care was asked to follow this patient by consultation request of  [name], Brandon Commons* to address advance care planning. ? ?                                 ASSESSMENT AND PLAN / RECOMMENDATIONS:  ? ?Advance Care Planning/Goals of Care: Goals include to maximize quality of life and symptom management. Patient/health care surrogate gave his/her permission to discuss.Our advance care planning conversation included a discussion about:    ?The value and importance of advance care planning  ?Experiences with loved ones who have been seriously ill or have died  ?Exploration of personal, cultural or spiritual beliefs that might influence medical decisions  ?Exploration of goals of care in  the event of a sudden injury or illness  ?Identification and preparation of a healthcare agent  ?Review and updating or creation of an  advance directive document . ?Decision not to resuscitate or to de-escalate disease focused treatments due to poor prognosis. ?CODE STATUS:  DNR, limited additional interventions, abx if indicated, IVF trial, no tube feeding as discussed with [name] and [name]  ?They would want him to receive the heimlich in event of airway obstruction/easily reversible scenario. ?They would not want him dialyzed.  ?Their biggest need is in-home care which they are still waiting on through [place] and both sons live out of town so more supervision and assistance is needed as pt fell twice recently.  He did get a walker now which is helping ([facility] and RN reportedly have been out before based on pt's calendar though he could not actually attest to this).  He also is blind in one eye--no longer safe to drive per his son ?Sons request  he be evaluated by hospice as there are no amenable treatment options for his CAD nor his CKD at this stage and his function has deteriorated.  Message sent to Dr. Tamala Julian to ask his prognostic opinion and request if he would be attending of record for hospice care should hospice nurse actually determine he is indeed eligible.  He was in a procedure at the time message was sent. ? ?Follow up Palliative Care Visit: Palliative care will continue to follow for  complex medical decision making, advance care planning, and clarification of goals. Return prn if not admitted to hospice. ? ?Thank you for the opportunity to participate in the care of [name].  The palliative care team will continue to follow. Please call our office at [phone] if we can be of additional assistance.  ? ?Hollace Kinnier, DO  ? ?COVID-19 PATIENT SCREENING TOOL ?Asked and negative response unless otherwise noted:  ? ?Have you had symptoms of covid, tested positive or been in contact with someone  with symptoms/positive test in the past 5-10 days? no ? ?

## 2021-07-14 ENCOUNTER — Ambulatory Visit (INDEPENDENT_AMBULATORY_CARE_PROVIDER_SITE_OTHER): Payer: Medicare HMO | Admitting: Internal Medicine

## 2021-07-14 ENCOUNTER — Telehealth: Payer: Self-pay | Admitting: *Deleted

## 2021-07-14 ENCOUNTER — Ambulatory Visit: Payer: Medicare HMO

## 2021-07-14 ENCOUNTER — Encounter: Payer: Self-pay | Admitting: Internal Medicine

## 2021-07-14 VITALS — BP 110/58 | HR 53 | Temp 97.6°F | Wt 138.2 lb

## 2021-07-14 DIAGNOSIS — I5042 Chronic combined systolic (congestive) and diastolic (congestive) heart failure: Secondary | ICD-10-CM

## 2021-07-14 DIAGNOSIS — I10 Essential (primary) hypertension: Secondary | ICD-10-CM

## 2021-07-14 DIAGNOSIS — E538 Deficiency of other specified B group vitamins: Secondary | ICD-10-CM

## 2021-07-14 DIAGNOSIS — E119 Type 2 diabetes mellitus without complications: Secondary | ICD-10-CM

## 2021-07-14 DIAGNOSIS — E785 Hyperlipidemia, unspecified: Secondary | ICD-10-CM

## 2021-07-14 DIAGNOSIS — R531 Weakness: Secondary | ICD-10-CM

## 2021-07-14 DIAGNOSIS — R269 Unspecified abnormalities of gait and mobility: Secondary | ICD-10-CM

## 2021-07-14 DIAGNOSIS — N185 Chronic kidney disease, stage 5: Secondary | ICD-10-CM | POA: Diagnosis not present

## 2021-07-14 DIAGNOSIS — E1122 Type 2 diabetes mellitus with diabetic chronic kidney disease: Secondary | ICD-10-CM

## 2021-07-14 DIAGNOSIS — Z794 Long term (current) use of insulin: Secondary | ICD-10-CM

## 2021-07-14 LAB — POCT GLYCOSYLATED HEMOGLOBIN (HGB A1C): Hemoglobin A1C: 6.2 % — AB (ref 4.0–5.6)

## 2021-07-14 NOTE — Telephone Encounter (Signed)
Form, insurance information, and demographics were faxed and confirmed. ?Left detailed message on machine for son Chauncy that [facility] was unable to process our request.  Authorization should go through "[employer] Team".  No fax or phone number was given.  Ill try to get more information at another time. ?

## 2021-07-14 NOTE — Telephone Encounter (Signed)
Spoke with son Ernst Bowler and was given a contact with Holt.  Claudia informed me that we would need the approval from [facility].  I called [employer] and received a fax.  Fax placed on Dr Ledell Noss desk ?

## 2021-07-14 NOTE — Progress Notes (Signed)
? ? ? ?Established Patient Office Visit ? ? ? ? ?CC/Reason for Visit: Follow-up chronic medical conditions ? ?HPI: [name] is a 88 y.o. male who is coming in today for the above mentioned reasons. Past Medical History is significant for: Hypertension, hyperlipidemia, stage V chronic kidney disease, chronic combined heart failure, coronary artery disease, vitamin B12 deficiency.  Unfortunately [name] health has been declining.  He is now in need for home care.  Referrals have been placed to home health.  Son is here today.  They are willing to bridge the gap with private duty setting if needed.  He already had a palliative care evaluation and he is being assessed for admittance to hospice care.  All his specialists are in agreement.  He is not a candidate for dialysis or renal transplant.  He continues to be short of breath on a daily basis. ? ? ?Past Medical/Surgical History: ?Past Medical History:  ?Diagnosis Date  ? Arthritis   ? "touch in my fingers" (09/10/2014)  ? Atrophic gastritis   ? AVM (arteriovenous malformation) of duodenum, acquired   ? B12 deficiency anemia   ? "stopped taking the shots in ~ 06/2014" (09/10/2014)  ? Barrett's esophagus   ? CAD (coronary artery disease)   ? a. H/o MI in 1998;  b. s/p CABG 2003. c. 10/2014 NSTEMI/Cath: LM 75, LAD 120md, D1 95, D2 50, LCX 100ost/p, OM1 80, OM2 80, RCA 100p/m, RPDA fills via L->L collats, LIMA->LAD ok, VG->dRCA 100, VG->OM1->OM2 99 prox to OM1 insertion->recanalated ->Tx with heparin,  VG->D1 100-->Med Rx.  ? Cataract   ? Chronic systolic CHF (congestive heart failure) ([facility]   ? a. EF 40-45% in 10/2014 (previously normal in 08/2014).  ? CKD (chronic kidney disease), stage IV ([facility]   ? Diabetes 1.5, managed as type 1 ([facility]   ? Hiatal hernia 2018  ? endoscopy -Dr. AHavery Moros ? History of stomach ulcers 2013  ? Hyperlipidemia   ? Hypertension   ? Hypertensive heart disease   ? Ischemic cardiomyopathy   ? a. 10/2014 Echo: EF 40-45%.  ? Myocardial  infarction ([facility]   ? PVD (peripheral vascular disease) ([facility]   ? Type II diabetes mellitus ([facility]   ? ? ?Past Surgical History:  ?Procedure Laterality Date  ? CARDIAC CATHETERIZATION  2003;   ? CARDIAC CATHETERIZATION N/A 11/07/2014  ? Left Heart Cath; HBelva Crome MD;   ? CATARACT EXTRACTION, BILATERAL Bilateral   ? CHOLECYSTECTOMY N/A 05/20/2015  ? Procedure: LAPAROSCOPIC CHOLECYSTECTOMY;  Surgeon: DCoralie Keens MD;  [facility]  Service: General;  Laterality: N/A;  ? COLONOSCOPY    ? COLONOSCOPY N/A 05/12/2015  ? Procedure: COLONOSCOPY;  Surgeon: DMilus Banister MD;  [facility]  Service: Endoscopy;  Laterality: N/A;  ? CORONARY ANGIOPLASTY  1998  ? /notes 07/13/2010  ? CORONARY ARTERY BYPASS GRAFT  2003  ? CABG X5  ? ESOPHAGOGASTRODUODENOSCOPY  06/10/2011  ? Procedure: ESOPHAGOGASTRODUODENOSCOPY (EGD);  Surgeon: MLadene Artist MD,FACG;  [facility];  Service: Endoscopy;  Laterality: N/A;  ? ESOPHAGOGASTRODUODENOSCOPY N/A 10/04/2016  ? Procedure: ESOPHAGOGASTRODUODENOSCOPY (EGD);  Surgeon: AManus Gunning MD;  [facility];  Service: Gastroenterology;  Laterality: N/A;  ? ? ?Social History: ? reports that he has quit smoking. His smoking use included cigarettes. He has a 10.00 pack-year smoking history. He has never used smokeless tobacco. He reports that he does not drink alcohol and does not use drugs. ? ?Allergies: ?No Known Allergies ? ?Family  History:  ?Family History  ?Problem Relation Age of Onset  ? Stroke Mother   ? Hypertension Mother   ? Cancer Mother   ? Diabetes Other   ?     brothers and sisters  ? Coronary artery disease Other   ? Hypertension Sister   ? Heart attack Sister   ? Colon cancer Neg Hx   ? ? ? ?Current Outpatient Medications:  ?  Accu-Chek Softclix Lancets lancets, TEST BLOOD SUGAR EVERY DAY, Disp: 100 each, Rfl: 12 ?  acetaminophen (TYLENOL) 500 MG tablet, Take 500 mg by mouth every 8 (eight) hours as needed for mild pain or headache.,  Disp: , Rfl:  ?  Alcohol Swabs (ALCOHOL PREP) PADS, USE TO TEST BLOOD GLUCOSE THREE TIMES DAILY, Disp: 300 each, Rfl: 3 ?  amLODipine (NORVASC) 10 MG tablet, Take 1 tablet (10 mg total) by mouth daily., Disp: 90 tablet, Rfl: 1 ?  aspirin EC 81 MG tablet, Take 81 mg by mouth at bedtime., Disp: , Rfl:  ?  atorvastatin (LIPITOR) 80 MG tablet, TAKE 1 TABLET EVERY DAY  AT  6PM, Disp: 90 tablet, Rfl: 2 ?  BD PEN NEEDLE NANO U/F 32G X 4 MM MISC, INJECT TWO TIMES DAILY AS NEEDED., Disp: 180 each, Rfl: 3 ?  bisacodyl (DULCOLAX) 5 MG EC tablet, Take 5 mg by mouth daily as needed for moderate constipation., Disp: , Rfl:  ?  Blood Glucose Monitoring Suppl (ACCU-CHEK AVIVA PLUS) w/Device KIT, USE AS DIRECTED, Disp: 1 kit, Rfl: 0 ?  brimonidine (ALPHAGAN) 0.2 % ophthalmic solution, Place 1 drop into both eyes 2 (two) times daily., Disp: , Rfl:  ?  carvedilol (COREG) 12.5 MG tablet, Take 1 tablet (12.5 mg total) by mouth 2 (two) times daily., Disp: 180 tablet, Rfl: 3 ?  cloNIDine (CATAPRES) 0.1 MG tablet, TAKE 1 TABLET AT BEDTIME, Disp: 90 tablet, Rfl: 1 ?  COLCRYS 0.6 MG tablet, TAKE 1 TABLET EVERY DAY, Disp: 90 tablet, Rfl: 1 ?  dorzolamide-timolol (COSOPT) 22.3-6.8 MG/ML ophthalmic solution, Place 1 drop into both eyes 2 (two) times daily., Disp: , Rfl:  ?  Ferrous Sulfate (IRON) 325 (65 Fe) MG TABS, Take 1 tablet by mouth in the morning, at noon, and at bedtime., Disp: , Rfl:  ?  furosemide (LASIX) 80 MG tablet, Take 1 tablet (80 mg total) by mouth daily. Please keep upcoming appointment in march 2023 for future refills. Thank you, Disp: 90 tablet, Rfl: 0 ?  glucose blood (ACCU-CHEK GUIDE) test strip, TEST BLOOD SUGAR EVERY DAY, Disp: 100 strip, Rfl: 1 ?  hydrALAZINE (APRESOLINE) 25 MG tablet, TAKE 1 TABLET (25 MG TOTAL) BY MOUTH 3 (THREE) TIMES DAILY., Disp: 270 tablet, Rfl: 1 ?  insulin aspart (NOVOLOG) 100 UNIT/ML FlexPen, Inject 5 Units into the skin 3 (three) times daily with meals., Disp: , Rfl:  ?  insulin glargine  (LANTUS SOLOSTAR) 100 UNIT/ML Solostar Pen, Inject 10-12 Units into the skin daily., Disp: , Rfl:  ?  isosorbide mononitrate (IMDUR) 60 MG 24 hr tablet, Take 1 tablet (60 mg total) by mouth in the morning and at bedtime., Disp: 180 tablet, Rfl: 3 ?  Lancet Devices (ACCU-CHEK SOFTCLIX) lancets, USE TO TEST BLOOD GLUCOSE THREE TIMES DAILY, Disp: 1 each, Rfl: 0 ?  latanoprost (XALATAN) 0.005 % ophthalmic solution, Place 1 drop into both eyes at bedtime. , Disp: , Rfl:  ?  nitroGLYCERIN (NITROSTAT) 0.4 MG SL tablet, Place 1 tablet (0.4 mg total) under the tongue every 5 (five)  minutes as needed for chest pain (3 doses MAX)., Disp: 75 tablet, Rfl: 1 ?  omeprazole (PRILOSEC) 20 MG capsule, Take 1 capsule (20 mg total) by mouth daily. **PLEASE CONTACT OFFICE TO SCHEDULE AN APPOINTMENT, Disp: 90 capsule, Rfl: 0 ?  OVER THE COUNTER MEDICATION, Take 1 tablet by mouth daily. Prostate formula, Disp: , Rfl:  ?  pentoxifylline (TRENTAL) 400 MG CR tablet, TAKE 1 TABLET TWICE DAILY AT 10AM AND 5PM, Disp: 180 tablet, Rfl: 1 ?  potassium chloride SA (KLOR-CON M) 20 MEQ tablet, TAKE 1 TABLET EVERY DAY, Disp: 90 tablet, Rfl: 0 ?  timolol (TIMOPTIC) 0.5 % ophthalmic solution, Place 1 drop into both eyes in the morning and at bedtime., Disp: , Rfl:  ? ?Current Facility-Administered Medications:  ?  cyanocobalamin ((VITAMIN B-12)) injection 1,000 mcg, 1,000 mcg, Intramuscular, Q30 days, [name], [name], MD, 1,000 mcg at 07/14/21 1030 ? ?Review of Systems:  ?Constitutional: Denies fever, chills, diaphoresis, appetite change. ?HEENT: Denies photophobia, eye pain, redness, mouth sores, trouble swallowing, neck pain, neck stiffness and tinnitus.   ?Respiratory: Denies chest tightness,  and wheezing.   ?Cardiovascular: Denies chest pain, palpitations and leg swelling.  ?Gastrointestinal: Denies nausea, vomiting, abdominal pain, diarrhea, constipation, blood in stool and abdominal distention.  ?Genitourinary: Denies dysuria, urgency,  frequency, hematuria, flank pain and difficulty urinating.  ?Endocrine: Denies: hot or cold intolerance, sweats, changes in hair or nails, polyuria, polydipsia. ?Musculoskeletal: Denies myalgias, back pain

## 2021-07-15 NOTE — Telephone Encounter (Signed)
Spoke with someone at [employer] and a new form will be faxed.  Chauncy is aware.  We also spoke about if [place] is not approved that we will call [employer] to see who may cover a home health aide.

## 2021-07-16 ENCOUNTER — Ambulatory Visit: Payer: Self-pay | Admitting: Licensed Clinical Social Worker

## 2021-07-16 DIAGNOSIS — N185 Chronic kidney disease, stage 5: Secondary | ICD-10-CM

## 2021-07-16 DIAGNOSIS — I5043 Acute on chronic combined systolic (congestive) and diastolic (congestive) heart failure: Secondary | ICD-10-CM

## 2021-07-16 DIAGNOSIS — E119 Type 2 diabetes mellitus without complications: Secondary | ICD-10-CM

## 2021-07-16 DIAGNOSIS — I10 Essential (primary) hypertension: Secondary | ICD-10-CM

## 2021-07-19 ENCOUNTER — Telehealth: Payer: Self-pay | Admitting: Internal Medicine

## 2021-07-19 NOTE — Telephone Encounter (Signed)
Pt requesting a call from Dr Jerilee Hoh regarding his father's home health care aid

## 2021-07-19 NOTE — Addendum Note (Signed)
Addended by: Westley Hummer B on: 07/19/2021 12:16 PM   Modules accepted: Orders

## 2021-07-19 NOTE — Telephone Encounter (Signed)
[name] from [facility] call and stated she want you to give her a call back about pt [name]'s # is [phone] [id number].

## 2021-07-19 NOTE — Telephone Encounter (Signed)
Spoke with son [name] [phone].  Explained that I spoke with [facility] and a list was given.  We are waiting on approval from [place].  [name] states that comfort care is ready and available  to help.  I told him if [facility] doesn't approve then we will try comfort care.

## 2021-07-19 NOTE — Telephone Encounter (Signed)
Left message on machine for [facility].  Spoke with [employer] and he requested that we call [employer] and get a list of places that accept the insurance.  Spoke with [employer] and a list of [employer] agencies were given.  Brunetta Jeans, [facility], [facility], [facility]  Referral was placed

## 2021-07-20 NOTE — Telephone Encounter (Signed)
Spoke with patient's son [name].  See phone note.

## 2021-07-20 NOTE — Telephone Encounter (Signed)
Pt's son Park Liter on the line requesting to speak with someone on his father's care team

## 2021-07-21 ENCOUNTER — Other Ambulatory Visit: Payer: Self-pay | Admitting: Interventional Cardiology

## 2021-07-21 NOTE — Telephone Encounter (Signed)
Spoke with [name].  See phone note 07/21/21.

## 2021-07-21 NOTE — Telephone Encounter (Signed)
Yes, [name] was able to accept at they have a better contract with [employer] than we do.  Rayburn Go is aware.

## 2021-07-21 NOTE — Telephone Encounter (Signed)
Spoke with Denman George a representative from [facility] and explained that we are waiting to hear back from [place].  She then put [name] on the phone.  After a lengthy conversation, a new referral for [place] with [facility] was sent.

## 2021-07-22 ENCOUNTER — Telehealth: Payer: Self-pay | Admitting: Internal Medicine

## 2021-07-22 ENCOUNTER — Telehealth: Payer: Self-pay | Admitting: *Deleted

## 2021-07-22 DIAGNOSIS — N2581 Secondary hyperparathyroidism of renal origin: Secondary | ICD-10-CM | POA: Diagnosis not present

## 2021-07-22 DIAGNOSIS — E611 Iron deficiency: Secondary | ICD-10-CM | POA: Diagnosis not present

## 2021-07-22 DIAGNOSIS — I120 Hypertensive chronic kidney disease with stage 5 chronic kidney disease or end stage renal disease: Secondary | ICD-10-CM | POA: Diagnosis not present

## 2021-07-22 DIAGNOSIS — N185 Chronic kidney disease, stage 5: Secondary | ICD-10-CM | POA: Diagnosis not present

## 2021-07-22 DIAGNOSIS — D631 Anemia in chronic kidney disease: Secondary | ICD-10-CM | POA: Diagnosis not present

## 2021-07-22 NOTE — Telephone Encounter (Signed)
From: [name]  Sent: 07/22/2021   1:30 PM EDT  To: [name]  Subject: RE: new home health order                       Office spoke with patient/family. They are really wanting [name] however their insurance do not cover this service. Family/Patient is aware of this and would like a day or two to think about home health services as this is covered by their ins.   Thanks!   ----- Message -----  From: [name]  Sent: 07/22/2021   9:24 AM EDT  To: [name]  Subject: new home health order                           [place]!   This patient needs to be seen for home health -- comfort care. Could you help me with this?   Thanks!  [name]          Additional Documentation  Encounter Info:  Billing Info, History, Allergies, Detailed Report    Orders Placed  None Medication Renewals and Changes   None   Medication List   Visit Diagnoses   None   Problem List

## 2021-07-22 NOTE — Chronic Care Management (AMB) (Signed)
Chronic Care Management    Clinical Social Work Note  07/22/2021 Name: [name] MRN: [id number] DOB: [birth year]  [name] is a 88 y.o. year old male who is a primary care patient of [name], [name], MD. The CCM team was consulted to assist the patient with chronic disease management and/or care coordination needs related to: Level of Care Concerns and Caregiver Stress.   Engaged with patient's son by telephone for follow up visit in response to provider referral for social work chronic care management and care coordination services.   Consent to Services:  The patient was given information about Chronic Care Management services, agreed to services, and gave verbal consent prior to initiation of services.  Please see initial visit note for detailed documentation.   Patient agreed to services and consent obtained.   Assessment: Review of patient past medical history, allergies, medications, and health status, including review of relevant consultants reports was performed today as part of a comprehensive evaluation and provision of chronic care management and care coordination services.     SDOH (Social Determinants of Health) assessments and interventions performed:    Advanced Directives Status: Not addressed in this encounter.  CCM Care Plan  No Known Allergies  Outpatient Encounter Medications as of 07/16/2021  Medication Sig   Accu-Chek Softclix Lancets lancets TEST BLOOD SUGAR EVERY DAY   acetaminophen (TYLENOL) 500 MG tablet Take 500 mg by mouth every 8 (eight) hours as needed for mild pain or headache.   Alcohol Swabs (ALCOHOL PREP) PADS USE TO TEST BLOOD GLUCOSE THREE TIMES DAILY   amLODipine (NORVASC) 10 MG tablet Take 1 tablet (10 mg total) by mouth daily.   aspirin EC 81 MG tablet Take 81 mg by mouth at bedtime.   BD PEN NEEDLE NANO U/F 32G X 4 MM MISC INJECT TWO TIMES DAILY AS NEEDED.   bisacodyl (DULCOLAX) 5 MG EC tablet Take 5 mg by mouth  daily as needed for moderate constipation.   Blood Glucose Monitoring Suppl (ACCU-CHEK AVIVA PLUS) w/Device KIT USE AS DIRECTED   brimonidine (ALPHAGAN) 0.2 % ophthalmic solution Place 1 drop into both eyes 2 (two) times daily.   carvedilol (COREG) 12.5 MG tablet Take 1 tablet (12.5 mg total) by mouth 2 (two) times daily.   cloNIDine (CATAPRES) 0.1 MG tablet TAKE 1 TABLET AT BEDTIME   COLCRYS 0.6 MG tablet TAKE 1 TABLET EVERY DAY   dorzolamide-timolol (COSOPT) 22.3-6.8 MG/ML ophthalmic solution Place 1 drop into both eyes 2 (two) times daily.   Ferrous Sulfate (IRON) 325 (65 Fe) MG TABS Take 1 tablet by mouth in the morning, at noon, and at bedtime.   furosemide (LASIX) 80 MG tablet Take 1 tablet (80 mg total) by mouth daily. Please keep upcoming appointment in march 2023 for future refills. Thank you   glucose blood (ACCU-CHEK GUIDE) test strip TEST BLOOD SUGAR EVERY DAY   hydrALAZINE (APRESOLINE) 25 MG tablet TAKE 1 TABLET (25 MG TOTAL) BY MOUTH 3 (THREE) TIMES DAILY.   insulin aspart (NOVOLOG) 100 UNIT/ML FlexPen Inject 5 Units into the skin 3 (three) times daily with meals.   insulin glargine (LANTUS SOLOSTAR) 100 UNIT/ML Solostar Pen Inject 10-12 Units into the skin daily.   isosorbide mononitrate (IMDUR) 60 MG 24 hr tablet Take 1 tablet (60 mg total) by mouth in the morning and at bedtime.   Lancet Devices (ACCU-CHEK SOFTCLIX) lancets USE TO TEST BLOOD GLUCOSE THREE TIMES DAILY   latanoprost (XALATAN) 0.005 % ophthalmic solution Place  1 drop into both eyes at bedtime.    nitroGLYCERIN (NITROSTAT) 0.4 MG SL tablet Place 1 tablet (0.4 mg total) under the tongue every 5 (five) minutes as needed for chest pain (3 doses MAX).   omeprazole (PRILOSEC) 20 MG capsule Take 1 capsule (20 mg total) by mouth daily. **PLEASE CONTACT OFFICE TO SCHEDULE AN APPOINTMENT   OVER THE COUNTER MEDICATION Take 1 tablet by mouth daily. Prostate formula   pentoxifylline (TRENTAL) 400 MG CR tablet TAKE 1 TABLET TWICE  DAILY AT 10AM AND 5PM   timolol (TIMOPTIC) 0.5 % ophthalmic solution Place 1 drop into both eyes in the morning and at bedtime.   [DISCONTINUED] atorvastatin (LIPITOR) 80 MG tablet TAKE 1 TABLET EVERY DAY  AT  6PM   [DISCONTINUED] potassium chloride SA (KLOR-CON M) 20 MEQ tablet TAKE 1 TABLET EVERY DAY   Facility-Administered Encounter Medications as of 07/16/2021  Medication   cyanocobalamin ((VITAMIN B-12)) injection 1,000 mcg    Patient Active Problem List   Diagnosis Date Noted   Iron deficiency anemia due to chronic blood loss 07/09/2019   Personal history of arterial venous malformation (AVM) 07/09/2019   Acute on chronic respiratory failure with hypoxia (HCC)    Acute exacerbation of CHF (congestive heart failure) ([place]) 08/11/2018   Gout due to renal impairment involving toe 02/08/2017   Insulin dependent diabetes mellitus    Chronic combined systolic and diastolic heart failure (HCC)    Acute blood loss anemia 05/10/2015   CKD (chronic kidney disease) stage 5, GFR less than 15 ml/min (HCC)    B12 deficiency 06/27/2011   Peripheral vascular disease ([place]) 01/19/2007   Peptic ulcer disease- 2013 01/19/2007   Diabetes mellitus type 2, insulin dependent ([place]) 10/02/2006   Dyslipidemia 10/02/2006   Essential hypertension 10/02/2006   Coronary artery disease involving 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 ([place]) 10/02/2006   BENIGN PROSTATIC HYPERTROPHY 10/02/2006    Conditions to be addressed/monitored: CHF, HTN, DMII, and CKD Stage 5 ; Level of care concerns  Care Plan : LCSW Plan of Care  Updates made by Rebekah Chesterfield, LCSW since 07/22/2021 12:00 AM     Problem: Quality of Life (General Plan of Care)      Goal: Quality of Life Maintained   Start Date: 07/16/2021  Expected End Date: 09/27/2021  This Visit's Progress: On track  Priority: High  Note:   Current Barriers:  Level of Care Concerns:Inability to perform IADL's independently and Inability  to perform ADL's independently  CSW Clinical Goal(s):  Patient  and family will verbalize understanding of plan for management of CHF, HTN, DMII, and CKD Stage 5  through collaboration with Clinical Social Worker, provider, and care team.   Interventions: Pt's son endorses feelings of frustration that father hasn't obtained an aid, since initial request on 06/17/21. Pt currently receives Home Health services while he resides independently. Closest family resides 34 miles away [name] stated that [facility] are comparable to pt's current Home Health services. He is requesting services through [facility], who will assist pt with ADLs daily. LCSW spoke with Hinton Dyer, who shared [name] is requesting CMA to inquire about eligibility for [place] with [facility]. For additional questions, contact Claudia Programmer, applications) or Hinton Dyer Education administrator) at [phone]  Inter-disciplinary care team collaboration (see longitudinal plan of care) Evaluation of current treatment plan related to  self management and patient's adherence to plan as established by provider Review resources, discussed options and provided patient information about  Enhanced Benefits  connected with insurance provider:(Family is requesting referral to aid service, [facility]) Solution-Focused Strategies employed:  Active listening / Reflection utilized  Emotional Support Provided Problem [place] strategies reviewed Caregiver stress acknowledged   Task & activities to accomplish goals: Contact PCP office with any questions or concerns Attend all scheduled provider appts Utilize healthy coping skills and/or supportive resources discussed           Follow Up Plan: SW will follow up with patient by phone over the next 2 weeks      Christa See, MSW, [facility] [email or web address] Phone [phone] 8:37 AM

## 2021-07-22 NOTE — Patient Instructions (Signed)
Visit Information  Thank you for taking time to visit with me today. Please don't hesitate to contact me if I can be of assistance to you before our next scheduled telephone appointment.  Following are the goals we discussed today:  Task & activities to accomplish goals: Contact PCP office with any questions or concerns Attend all scheduled provider appts Utilize healthy coping skills and/or supportive resources discussed  Our next appointment is by telephone on 07/30/21 at 10:45 AM  Please call the care guide team at [phone] if you need to cancel or reschedule your appointment.   If you are experiencing a Mental Health or [place] or need someone to talk to, please call 911   Following is a copy of your full plan of care:  Care Plan : LCSW Plan of Care  Updates made by Rebekah Chesterfield, LCSW since 07/22/2021 12:00 AM     Problem: Quality of Life (General Plan of Care)      Goal: Quality of Life Maintained   Start Date: 07/16/2021  Expected End Date: 09/27/2021  This Visit's Progress: On track  Priority: High  Note:   Current Barriers:  Level of Care Concerns:Inability to perform IADL's independently and Inability to perform ADL's independently  CSW Clinical Goal(s):  Patient  and family will verbalize understanding of plan for management of CHF, HTN, DMII, and CKD Stage 5  through collaboration with Clinical Social Worker, provider, and care team.   Interventions: Pt's son endorses feelings of frustration that father hasn't obtained an aid, since initial request on 06/17/21. Pt currently receives Home Health services while he resides independently. Closest family resides 54 miles away [name] stated that [facility] are comparable to pt's current Home Health services. He is requesting services through [facility], who will assist pt with ADLs daily. LCSW spoke with Hinton Dyer, who shared [name] is requesting CMA to inquire about eligibility for [place] with  [facility]. For additional questions, contact Claudia Programmer, applications) or Hinton Dyer Education administrator) at [phone]  Inter-disciplinary care team collaboration (see longitudinal plan of care) Evaluation of current treatment plan related to  self management and patient's adherence to plan as established by provider Review resources, discussed options and provided patient information about  Enhanced Benefits connected with insurance provider:(Family is requesting referral to aid service, [facility]) Solution-Focused Strategies employed:  Active listening / Reflection utilized  Emotional Support Provided Problem [place] strategies reviewed Caregiver stress acknowledged   Task & activities to accomplish goals: Contact PCP office with any questions or concerns Attend all scheduled provider appts Utilize healthy coping skills and/or supportive resources discussed          Mr. Teuscher was given information about Care Management services by the embedded care coordination team including:  Care Management services include personalized support from designated clinical staff supervised by his physician, including individualized plan of care and coordination with other care providers 24/7 contact phone numbers for assistance for urgent and routine care needs. The patient may stop CCM services at any time (effective at the end of the month) by phone call to the office staff.  Patient agreed to services and verbal consent obtained.   The patient verbalized understanding of instructions, educational materials, and care plan provided today and DECLINED offer to receive copy of patient instructions, educational materials, and care plan.   Christa See, MSW, [facility] [email or web address] Phone [phone] 8:38 AM

## 2021-07-22 NOTE — Telephone Encounter (Signed)
Bridgett from [facility] called and left a message to call her back.  I called the phone number and talked with Percell Miller B.  He then transferred me to [place].  Telephone reference number # [id number].  Ronalee Belts informed me that home health aide is not covered by the patient's insurance plan.

## 2021-07-22 NOTE — Telephone Encounter (Signed)
From: Jana Half  Sent: 07/21/2021   3:05 PM EDT  To: Caleb Popp   Yes, [facility] was able to accept at they have a better contract with [facility] than we do. Just asked them for start of care date. I will let you as soon as I hear back from them today :-)   ----- Message -----  From: Caleb Popp  Sent: 07/21/2021   1:34 PM EDT  To: [place]!   Any updates on this?   ----- Message -----  From: Jana Half  Sent: 07/20/2021  11:06 AM EDT  To: Foye Spurling Leak   Thank you!   ----- Message -----  From: Caleb Popp  Sent: 07/19/2021   4:27 PM EDT  To: [place]!   The CMA updated the order. Please let me know if you need for [place] to anything else to assist you with this.   Thanks!  Paula Libra

## 2021-07-22 NOTE — Telephone Encounter (Signed)
From: Jana Half  Sent: 07/22/2021   1:30 PM EDT  To: Caleb Popp  Subject: RE: new home health order                       Office spoke with patient/family. They are really wanting [place] however their insurance do not cover this service. Family/Patient is aware of this and would like a day or two to think about home health services as this is covered by their ins.   Thanks!   ----- Message -----  From: Caleb Popp  Sent: 07/22/2021   9:24 AM EDT  To: Jana Half  Subject: new home health order                           [place]!   This patient needs to be seen for home health -- comfort care. Could you help me with this?   Thanks!  Paula Libra

## 2021-07-22 NOTE — Telephone Encounter (Signed)
[facility] is also providing home health services to patient in addition to [place]. [facility] cannot start providing this service at the same time as [facility]. [facility] [phone] may be able to assist [name]'s with someone coming in to assist him with shopping, taking him to appointments, etc.

## 2021-07-28 DIAGNOSIS — Z87891 Personal history of nicotine dependence: Secondary | ICD-10-CM | POA: Diagnosis not present

## 2021-07-28 DIAGNOSIS — I10 Essential (primary) hypertension: Secondary | ICD-10-CM

## 2021-07-28 DIAGNOSIS — E1159 Type 2 diabetes mellitus with other circulatory complications: Secondary | ICD-10-CM | POA: Diagnosis not present

## 2021-07-28 DIAGNOSIS — Z794 Long term (current) use of insulin: Secondary | ICD-10-CM | POA: Diagnosis not present

## 2021-07-29 ENCOUNTER — Telehealth: Payer: Self-pay | Admitting: *Deleted

## 2021-07-29 ENCOUNTER — Telehealth: Payer: Self-pay | Admitting: Pharmacist

## 2021-07-29 ENCOUNTER — Telehealth: Payer: Self-pay | Admitting: Internal Medicine

## 2021-07-29 DIAGNOSIS — E119 Type 2 diabetes mellitus without complications: Secondary | ICD-10-CM

## 2021-07-29 DIAGNOSIS — R269 Unspecified abnormalities of gait and mobility: Secondary | ICD-10-CM

## 2021-07-29 DIAGNOSIS — R531 Weakness: Secondary | ICD-10-CM

## 2021-07-29 NOTE — Telephone Encounter (Signed)
[name], [facility] will cover bathing only. They are looking for light duty house cleaning and someone to take care of them for so many hours at day. This would be [facility].

## 2021-07-29 NOTE — Chronic Care Management (AMB) (Signed)
    Chronic Care Management Pharmacy Assistant   Name: [name]  MRN: [id number] DOB: 08-13-1931  07/30/2021 APPOINTMENT REMINDER  Called [name], No answer, left message of appointment on 07/30/2021 at 1:00 via telephone visit with Jeni Salles, Pharm D. Notified to have all medications, supplements, blood pressure and/or blood sugar logs available during appointment and to return call if need to reschedule.  Care Gaps: AWV - previous message sent to [name] Last BP - 110/58 on 07/14/2021 Last A1C - 6.8 on 04/15/2021 Foot exam - overdue Covid booster - postponed  Star Rating Drug: Atorvastatin '80mg'$  - last filled 07/22/2021 90 DS at [facility]  Any gaps in medications fill history? No  [name]  Catering manager [phone]

## 2021-07-29 NOTE — Telephone Encounter (Signed)
[name] is calling [name] pt son said comfort care will provide his dad with PT and Custodian Care please call and speak with claudia at [phone]

## 2021-07-29 NOTE — Telephone Encounter (Signed)
Left a message for [name] for clarification about custodian care. [employer] will not pay for home health aide.

## 2021-07-29 NOTE — Progress Notes (Signed)
Chronic Care Management Pharmacy Note  07/30/2021 Name:  [name] MRN:  [id number] DOB:  [birth year]  Summary: Pt is not checking blood pressures routinely Pt has not had any BGs <70 but may have hypoglycemic unawareness Recent low BP in office   Recommendations/Changes made from today's visit: -Recommend keeping a log of blood pressures and blood sugars  Plan: Follow up with RN CM in 1 week  Subjective: [name] is an 88 y.o. year old male who is a primary patient of [name], [name], MD.  The CCM team was consulted for assistance with disease management and care coordination needs.    Engaged with patient by telephone for follow up visit in response to provider referral for pharmacy case management and/or care coordination services.   Consent to Services:  The patient was given information about Chronic Care Management services, agreed to services, and gave verbal consent prior to initiation of services.  Please see initial visit note for detailed documentation.   Patient Care Team: [name], [name], MD as PCP - General (Internal Medicine) Belva Crome, MD as PCP - Cardiology (Cardiology) Viona Gilmore, [facility] as Pharmacist (Pharmacist) Dimitri Ped, RN as Jay Management  Recent office visits: 07/16/21 Christa See, LCSW: Patient presented for SW CCM visit.  07/14/21 Domingo Mend, MD: Patient presented for DM follow up. Referral placed for home health. Vitamin B12 injection administered.  06/30/21 Chong Sicilian, RN: Patient presented for RN CCM initial visit.  04/15/2021 Thersa Salt MD - Patient was seen for Type 2 diabetes mellitus with stage 5 chronic kidney disease not on chronic dialysis, with long-term current use of insulin and additional issues. No medication changes. No follow up noted.   03/24/21 Domingo Mend, MD: Patient presented for fatigue. Increased furosemide to 80 mg  BID for 3 days. Sent message to cardiology to be seen sooner.  03/15/20 Patient presented for vitamin B12 injection.   Recent consult visits: 07/12/21 Hollace Kinnier, DO (palliative care): Patient presented for advance care planning visit. Plan for evaluation from hospice.  07/08/21 Hollace Kinnier, DO (palliative care): Patient presented for initial palliative care visit.  06/16/21 Daneen Schick, MD (Cardiology): Patient was seen for Chronic combined systolic and diastolic heart failure and 5 other issues. Referred to palliative care.  05/19/21 Daneen Schick MD (cardiology) - Patient was seen for  Coronary artery disease involving 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 and additional issues. No medication changes. Follow up in 4 months.   12/31/2020 Luberta Mutter MD (opthalmology) - Patient was seen for glaucoma evaluation. No medication changes. Follow up in 6 months.  11/25/20 Melony Overly, MD (ENT): Patient presented for cerumen impaction and removal.  Hospital visits: None in previous 6 months  Objective:  Lab Results  Component Value Date   CREATININE 4.16 (H) 04/15/2021   BUN 74 (H) 04/15/2021   GFR 12.21 (LL) 04/15/2021   GFRNONAA 14 (L) 08/05/2019   GFRAA 16 03/05/2021   NA 138 04/15/2021   K 4.3 04/15/2021   CALCIUM 9.0 04/15/2021   CO2 19 04/15/2021   GLUCOSE 151 (H) 04/15/2021    Lab Results  Component Value Date/Time   HGBA1C 6.2 (A) 07/14/2021 10:30 AM   HGBA1C 6.8 (A) 04/15/2021 10:04 AM   HGBA1C 7.5 (H) 08/11/2018 03:19 PM   HGBA1C 7.0 (H) 08/02/2017 01:52 PM   GFR 12.21 (LL) 04/15/2021 10:22 AM   GFR 23.08 (L) 08/02/2017 01:52 PM  MICROALBUR 30.8 (H) 08/02/2017 01:52 PM   MICROALBUR 94.9 (H) 03/28/2014 10:21 AM    Last diabetic Eye exam:  Lab Results  Component Value Date/Time   HMDIABEYEEXA No Retinopathy 02/15/2021 12:00 AM    Last diabetic Foot exam: No results found for: HMDIABFOOTEX   Lab Results  Component Value Date    CHOL 120 11/21/2018   HDL 23 (L) 11/21/2018   LDLCALC 61 11/21/2018   LDLDIRECT 93.3 01/22/2013   TRIG 215 (H) 11/21/2018   CHOLHDL 5.2 (H) 11/21/2018       Latest Ref Rng & Units 04/15/2021   10:22 AM 03/05/2021   12:00 AM 07/16/2019    3:55 PM  Hepatic Function  Total Protein 6.0 - 8.3 g/dL 6.8    7.2    Albumin 3.5 - 5.2 g/dL 4.0   4.0      4.2    AST 0 - 37 U/L 14    16    ALT 0 - 53 U/L 12    14    Alk Phosphatase 39 - 117 U/L 65    105    Total Bilirubin 0.2 - 1.2 mg/dL 0.4    0.4       This result is from an external source.    Lab Results  Component Value Date/Time   TSH 4.84 (H) 08/02/2017 01:52 PM   TSH 2.846 12/29/2014 07:42 PM   TSH 3.608 09/10/2014 08:33 PM   TSH 4.99 (H) 03/28/2014 10:21 AM   FREET4 0.61 09/10/2014 08:33 PM       Latest Ref Rng & Units 03/05/2021   12:00 AM 07/15/2020    8:58 AM 06/24/2020    9:31 AM  CBC  Hemoglobin 13.5 - 17.5 10.1      9.3   9.3       This result is from an external source.    No results found for: VD25OH  Clinical ASCVD: Yes  The ASCVD Risk score (Arnett DK, et al., 2019) failed to calculate for the following reasons:   The 2019 ASCVD risk score is only valid for ages 75 to 26       07/14/2021   11:12 AM 03/24/2021   11:07 AM 07/09/2020    8:58 AM  Depression screen PHQ 2/9  Decreased Interest 3 0 0  Down, Depressed, Hopeless 2 0 0  PHQ - 2 Score 5 0 0  Altered sleeping 0  0  Tired, decreased energy 3  0  Change in appetite 3  3  Feeling bad or failure about yourself  0  1  Trouble concentrating 0  0  Moving slowly or fidgety/restless 1  0  Suicidal thoughts 0  0  PHQ-9 Score 12  4  Difficult doing work/chores Very difficult  Not difficult at all      Social History   Tobacco Use  Smoking Status Former   Packs/day: 0.50   Years: 20.00   Pack years: 10.00   Types: Cigarettes  Smokeless Tobacco Never  Tobacco Comments   "quit smoking cigarettes in the 1960s    BP Readings from Last 3 Encounters:   07/14/21 (!) 110/58  07/08/21 130/60  06/16/21 (!) 112/58   Pulse Readings from Last 3 Encounters:  07/14/21 (!) 53  07/08/21 (!) 54  06/16/21 (!) 57   Wt Readings from Last 3 Encounters:  07/14/21 138 lb 3.2 oz (62.7 kg)  07/08/21 137 lb 3.2 oz (62.2 kg)  06/16/21 144 lb 12.8 oz (65.7  kg)   BMI Readings from Last 3 Encounters:  07/14/21 19.83 kg/m  07/08/21 19.69 kg/m  06/16/21 20.78 kg/m    Assessment/Interventions: Review of patient past medical history, allergies, medications, health status, including review of consultants reports, laboratory and other test data, was performed as part of comprehensive evaluation and provision of chronic care management services.   SDOH:  (Social Determinants of Health) assessments and interventions performed: No   CCM Care Plan  No Known Allergies  Medications Reviewed Today     Reviewed by Viona Gilmore, [facility] (Pharmacist) on 07/30/21 at 88  Med List Status: <None>   Medication Order Taking? Sig Documenting Provider Last Dose Status Informant  Accu-Chek Softclix Lancets lancets [id number]  TEST BLOOD SUGAR EVERY DAY [name], [name], MD  Active   acetaminophen (TYLENOL) 500 MG tablet [id number]  Take 500 mg by mouth every 8 (eight) hours as needed for mild pain or headache. [provider]  Active Self  Alcohol Swabs (ALCOHOL PREP) PADS [id number]  USE TO TEST BLOOD GLUCOSE THREE TIMES DAILY Marletta Lor, MD  Active Self  amLODipine (NORVASC) 10 MG tablet [id number]  Take 1 tablet (10 mg total) by mouth daily. [name], [name], MD  Active   aspirin EC 81 MG tablet [id number]  Take 81 mg by mouth at bedtime. [provider]  Active Self  atorvastatin (LIPITOR) 80 MG tablet [id number]  TAKE 1 TABLET EVERY DAY  AT  Moshe Salisbury, MD  Active   BD PEN NEEDLE NANO U/F 32G X 4 MM MISC [id number]  INJECT TWO TIMES DAILY AS NEEDED. Marletta Lor, MD  Active Self  bisacodyl (DULCOLAX) 5  MG EC tablet [id number]  Take 5 mg by mouth daily as needed for moderate constipation. [provider]  Active Self  Blood Glucose Monitoring Suppl (ACCU-CHEK AVIVA PLUS) w/Device KIT [id number]  USE AS DIRECTED [name], [name], MD  Active   brimonidine [facility]) 0.2 % ophthalmic solution [id number]  Place 1 drop into both eyes 2 (two) times daily. [provider]  Active   carvedilol (COREG) 12.5 MG tablet [id number]  Take 1 tablet (12.5 mg total) by mouth 2 (two) times daily. Belva Crome, MD  Active   cloNIDine (CATAPRES) 0.1 MG tablet [id number]  TAKE 1 TABLET AT BEDTIME [name], [name], MD  Active   COLCRYS 0.6 [facility] tablet [id number]  TAKE 1 TABLET EVERY DAY [name], [name], MD  Active   cyanocobalamin ((VITAMIN B-12)) injection 1,000 mcg [id number]   [name], [name], MD  Active   dorzolamide-timolol (COSOPT) 22.3-6.8 MG/ML ophthalmic solution [id number]  Place 1 drop into both eyes 2 (two) times daily. [provider]  Active Self  Ferrous Sulfate (IRON) 325 (65 Fe) MG TABS [id number]  Take 1 tablet by mouth in the morning, at noon, and at bedtime. [provider]  Active Self  furosemide (LASIX) 80 MG tablet [id number]  Take 1 tablet (80 mg total) by mouth daily. Please keep upcoming appointment in march 2023 for future refills. Thank you Belva Crome, MD  Active   glucose blood (ACCU-CHEK GUIDE) test strip [id number]  TEST BLOOD SUGAR EVERY DAY [name], [name], MD  Active   hydrALAZINE (APRESOLINE) 25 MG tablet [id number]  TAKE 1 TABLET (25 MG TOTAL) BY MOUTH 3 (THREE) TIMES DAILY. Belva Crome, MD  Active   insulin glargine (LANTUS SOLOSTAR) 100 UNIT/ML Solostar Pen [id number] Yes Inject 10-12  Units into the skin daily. [provider] Taking Active   isosorbide mononitrate (IMDUR) 60 MG 24 hr tablet [id number]  Take 1 tablet (60 mg total) by mouth in the morning and at bedtime. Belva Crome, MD  Active    Lancet Devices [facility]) lancets [id number]  USE TO TEST BLOOD GLUCOSE THREE TIMES DAILY Marletta Lor, MD  Active Self  latanoprost (XALATAN) 0.005 % ophthalmic solution [id number]  Place 1 drop into both eyes at bedtime.  [provider]  Active Self  nitroGLYCERIN (NITROSTAT) 0.4 MG SL tablet [id number]  Place 1 tablet (0.4 mg total) under the tongue every 5 (five) minutes as needed for chest pain (3 doses MAX). Belva Crome, MD  Active Self  omeprazole (PRILOSEC) 20 MG capsule [id number]  Take 1 capsule (20 mg total) by mouth daily. **PLEASE CONTACT OFFICE TO SCHEDULE AN APPOINTMENT Ritta Slot  Active   OVER THE COUNTER MEDICATION [id number]  Take 1 tablet by mouth daily. Prostate formula [provider]  Active   pentoxifylline (TRENTAL) 400 MG CR tablet [id number]  TAKE 1 TABLET TWICE DAILY AT 10AM AND 5PM [name], [name], MD  Active   potassium chloride SA Rebeca Allegra M) 20 MEQ tablet [id number]  TAKE 1 TABLET EVERY DAY Belva Crome, MD  Active   timolol (TIMOPTIC) 0.5 % ophthalmic solution [id number]  Place 1 drop into both eyes in the morning and at bedtime. [provider]  Active             Patient Active Problem List   Diagnosis Date Noted   Iron deficiency anemia due to chronic blood loss 07/09/2019   Personal history of arterial venous malformation (AVM) 07/09/2019   Acute on chronic respiratory failure with hypoxia (HCC)    Acute exacerbation of CHF (congestive heart failure) ([place]) 08/11/2018   Gout due to renal impairment involving toe 02/08/2017   Insulin dependent diabetes mellitus    Chronic combined systolic and diastolic heart failure (HCC)    Acute blood loss anemia 05/10/2015   CKD (chronic kidney disease) stage 5, GFR less than 15 ml/min (HCC)    B12 deficiency 06/27/2011   Peripheral vascular disease ([place]) 01/19/2007   Peptic ulcer disease- 2013 01/19/2007   Diabetes mellitus type 2, insulin  dependent ([place]) 10/02/2006   Dyslipidemia 10/02/2006   Essential hypertension 10/02/2006   Coronary artery disease involving 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 ([place]) 10/02/2006   BENIGN PROSTATIC HYPERTROPHY 10/02/2006    Immunization History  Administered Date(s) Administered   Fluad Quad(high Dose 65+) 11/14/2018   Influenza Split 01/07/2011, 11/26/2012, 12/07/2016   Influenza Whole 02/28/2005, 12/07/2007, 12/19/2008, 11/27/2009   Influenza, High Dose Seasonal PF 12/28/2015, 11/10/2019, 11/09/2020   Influenza,inj,Quad PF,6+ Mos 11/13/2013, 11/09/2014   Influenza-Unspecified 11/08/2016, 11/16/2017, 12/02/2017, 11/08/2018   Moderna SARS-COV2 Booster Vaccination 12/28/2019   Moderna Sars-Covid-2 Vaccination 03/19/2019, 04/01/2019, 04/16/2019, 04/29/2019   Pneumococcal Conjugate-13 01/28/2013, 11/08/2014   Pneumococcal Polysaccharide-23 02/28/2005, 02/28/2009   Tdap 01/13/2012   Zoster Recombinat (Shingrix) 03/17/2021, 05/14/2021   Patient reports he has a lot going on but when asked to clarify, he couldn't. He reported he was not doing well overall. He also reports he is checking blood pressures and blood sugars at home but could not provide readings and does not write them down.  Conditions to be addressed/monitored:  Hypertension, Hyperlipidemia, Diabetes, Coronary Artery Disease, GERD, Chronic Kidney Disease, Gout and anemia  Conditions addressed this visit: Hypertension, diabetes  Care Plan : [place]  Updates made by Viona Gilmore, RPH since 07/30/2021 12:00 AM     Problem: Problem: Hypertension, Hyperlipidemia, Diabetes, Coronary Artery Disease, GERD, Chronic Kidney Disease, Gout and anemia      Long-Range Goal: Patient-Specific Goal   Start Date: 05/19/2020  Expected End Date: 05/19/2021  Recent Progress: On track  Priority: High  Note:   Current Barriers:  Unable to independently monitor therapeutic efficacy  Pharmacist  Clinical Goal(s):  Patient will achieve adherence to monitoring guidelines and medication adherence to achieve therapeutic efficacy through collaboration with PharmD and provider.   Interventions: 1:1 collaboration with [name], [name], MD regarding development and update of comprehensive plan of care as evidenced by provider attestation and co-signature Inter-disciplinary care team collaboration (see longitudinal plan of care) Comprehensive medication review performed; medication list updated in electronic medical record  Hypertension (BP goal <140/90) -Controlled -Current treatment: Amlodipine 92m, 1 tablet once daily  - Appropriate, Effective, Safe, Accessible Carvedilol 258m 1 tablet twice daily with meals - Appropriate, Effective, Safe, Accessible Clonidine 0.90m68m1 tablet at bedtime - Appropriate, Effective, Query Safe, Accessible Hydralazine 62m4m tablet three times daily - Appropriate, Effective, Safe, Accessible -Medications previously tried: benazepril, felodipine, labetalol, metoprolol  -Current home readings: 135/30 - not sure about this (checking once or twice a week) -Current dietary habits: uses very little salt but does get takeout a couple times a week -[facility] habits[facility]; goes for walks -Denies hypotensive/hypertensive symptoms -Educated on Exercise goal of 150 minutes per week; Importance of home blood pressure monitoring; -Counseled to monitor BP at home weekly, document, and provide log at future appointments -Counseled on diet and exercise extensively Recommended to continue current medication  Hyperlipidemia: (LDL goal < 55) -Uncontrolled -Current treatment: Atorvastatin 80mg66mablet daily - Appropriate, Query effective, Safe, Accessible -Medications previously tried: simvastatin  -Current dietary patterns: did not discuss -Current exercise habits: [facility]; goes for walks -Educated on Cholesterol goals;  Importance of limiting foods high in cholesterol; Exercise goal of 150 minutes per week; -Counseled on diet and exercise extensively Recommended to continue current medication Recommended repeat lipid panel.  Coronary artery disease (Goal: prevent heart events) -Controlled -Current treatment  Isosorbide mononitrate 60mg,73mablets once daily - Appropriate, Effective, Safe, Accessible Nitroglycerin 0.4mg SL390mlace 1 tablet under tongue every five minutes as needed for chest pain (3 doses max) - Appropriate, Effective, Safe, Accessible Aspirin 890mg, 160mlet once daily - Appropriate, Effective, Safe, Accessible -Medications previously tried: none  -Recommended to continue current medication  Diabetes (A1c goal <7%) -Controlled -Current medications: Insulin glargine (Lantus Solostar) inject 10-12 units once daily at 10 pm (doesn't inject below 90) - Appropriate, Effective, Query Safe, Accessible -Medications previously tried: glipizide, Novolog  -Current home glucose readings fasting glucose: 110-120 (usually within this range)  - every other day post prandial glucose: 135 -Denies hypoglycemic/hyperglycemic symptoms (not < 70) -Current meal patterns:  breakfast: n/a lunch: n/a  dinner: n/a snacks: n/a drinks: tries to watch what he eats but does drink some soda and sweet tea -Current exercise: [employer] class once a week and walking daily -Educated on Exercise goal of 150 minutes per week; Benefits of routine self-monitoring of blood sugar; Continuous glucose monitoring; Carbohydrate counting and/or plate method -Counseled to check feet daily and get yearly eye exams -Recommended to continue current medication  Heart Failure (Goal:  manage symptoms and prevent exacerbations) -Controlled -Last ejection fraction: 30-35% (Date: 08/05/19) -HF type: Combined Systolic and Diastolic -NYHA Class: II (slight limitation of activity) -AHA  HF Stage: C (Heart disease and symptoms present) -Current treatment: Carvedilol 63m, 1 tablet twice daily with meals - Appropriate, Effective, Safe, Accessible Clonidine 0.133m 1 tablet at bedtime  - Appropriate, Effective, Query Safe, Accessible Hydralazine 2534m1 tablet three times daily - Appropriate, Effective, Safe, Accessible Isosorbide mononitrate 42m65m hr tablet, 2 tablet once daily  - Appropriate, Effective, Safe, Accessible Potassium chloride 20mE52m tablets for first dose, then take 1 tablet once daily - Appropriate, Effective, Safe, Accessible Furosemide 80mg,47mablet once daily - Appropriate, Query effective, Safe, Accessible -Medications previously tried: n/a  -Current home BP/HR readings: reports all are normal -Current dietary habits: tries to limit salt intake -Current exercise habits: weekly [employer] class and walking almost daily -Educated on Benefits of medications for managing symptoms and prolonging life Importance of weighing daily; if you gain more than 3 pounds in one day or 5 pounds in one week, call cardiologist Importance of blood pressure control -Recommended to continue current medication Recommended switching to torsemide if patient is still retaining fluid.   Barrett's esophagus/GERD/PUD (Goal: minimize symptoms) -Controlled -Current treatment  Omeprazole 20 mg 1 capsule daily - Appropriate, Effective, Safe, Accessible -Medications previously tried: pantoprazole  -Recommended to continue current medication  Gout (Goal: uric acid < 6 and prevent flare ups) -Controlled -Current treatment  Colchicine 0.6mg, 117mblet once daily as needed  - Appropriate, Effective, Safe, Accessible -Medications previously tried: none  -Recommended repeat uric acid level  Peripheral vascular disease (Goal: prevent heart events) -Controlled -Current treatment  Pentoxifylline (Trental) 400mg, 136mlet twice daily (at 10 AM and 5PM) - Appropriate, Effective, Safe,  Accessible -Medications previously tried: none  -Recommended to continue current medication  Vitamin B12 deficiency (Goal: vitamin B12 > 211) -Controlled -Current treatment  Cyanocobalamin 1000mcg in37m once every 30 days - Appropriate, Effective, Safe, Accessible -Medications previously tried: none  -Recommended repeat vitamin B12 level  Glaucoma (Goal: lower intraocular pressure) -Controlled -Current treatment  Dorzolamide/ timolol mealate eye drops, 1 drop in both eyes twice a day - Appropriate, Effective, Safe, Accessible -Medications previously tried: n/a  -Recommended to continue current medication  Iron deficiency anemia (Goal: Hgb > 11) -Controlled -Current treatment  Ferrous sulfate 65 mg 1 tablet three times daily - Appropriate, Effective, Safe, Accessible -Medications previously tried: none  -Recommended to continue current medication  Stage 4 CKD (Goal: minimize damage to kidneys) -Controlled -Current treatment  No medications -Medications previously tried: n/a  - Continue to adjust medications as needed   Health Maintenance -Vaccine gaps: shingrix -Current therapy:  APAP 500mg, 1 t55mt every eight hours as needed for mild pain or headache  Bisacodyl (Dulcolax) 5mg, 1 tab67m once daily as needed for moderate constipation  -Educated on Cost vs benefit of each product must be carefully weighed by individual consumer -Patient is satisfied with current therapy and denies issues -Recommended to continue current medication  Patient Goals/Self-Care Activities Patient will:  - take medications as prescribed check glucose daily, document, and provide at future appointments check blood pressure weekly, document, and provide at future appointments target a minimum of 150 minutes of moderate intensity exercise weekly  Follow Up Plan: Telephone follow up appointment with care management team member scheduled for: 4 months       Medication Assistance: None  required.  Patient affirms current coverage meets needs.  Compliance/Adherence/Medication fill  history: Care Gaps: Foot exam, COVID booster Last BP - 110/58 on 07/14/2021 Last A1C - 6.8 on 04/15/2021  Star-Rating Drugs: Atorvastatin 82m - [facility]  [facility] [address]Phone: [phone]: [phone] [address]Phone: [phone]: [phone] Uses pill box? No - patient reports taking for a long time. Does not use pill box. Reports never forgetting dose.  Pt endorses 100% compliance  We discussed: Current pharmacy is preferred with insurance plan and patient is satisfied with pharmacy services Patient decided to: Continue current medication management strategy  Care Plan and Follow Up Patient Decision:  Patient agrees to Care Plan and Follow-up.  Plan: The care management team will reach out to the patient again over the next 14 days.  MJeni Salles PharmD BAmb[facility] Surgical Associates LLCClinical Pharmacist [place]South Uniontownat BH[place]

## 2021-07-30 ENCOUNTER — Encounter: Payer: Self-pay | Admitting: Internal Medicine

## 2021-07-30 ENCOUNTER — Telehealth: Payer: Self-pay | Admitting: Internal Medicine

## 2021-07-30 ENCOUNTER — Ambulatory Visit (INDEPENDENT_AMBULATORY_CARE_PROVIDER_SITE_OTHER): Payer: Medicare HMO | Admitting: Pharmacist

## 2021-07-30 ENCOUNTER — Ambulatory Visit: Payer: Medicare HMO | Admitting: Licensed Clinical Social Worker

## 2021-07-30 DIAGNOSIS — I10 Essential (primary) hypertension: Secondary | ICD-10-CM

## 2021-07-30 DIAGNOSIS — E119 Type 2 diabetes mellitus without complications: Secondary | ICD-10-CM

## 2021-07-30 DIAGNOSIS — E1122 Type 2 diabetes mellitus with diabetic chronic kidney disease: Secondary | ICD-10-CM

## 2021-07-30 DIAGNOSIS — N185 Chronic kidney disease, stage 5: Secondary | ICD-10-CM

## 2021-07-30 NOTE — Telephone Encounter (Signed)
Requesting a call from [place] regarding [employer] coverage for home health

## 2021-07-30 NOTE — Progress Notes (Signed)
Lab from [facility].

## 2021-08-02 ENCOUNTER — Other Ambulatory Visit: Payer: Self-pay | Admitting: *Deleted

## 2021-08-02 DIAGNOSIS — Z515 Encounter for palliative care: Secondary | ICD-10-CM

## 2021-08-02 NOTE — Telephone Encounter (Signed)
Spoke with [name] and they are requesting a referral for PT and a home health referral.  Okay to place?

## 2021-08-02 NOTE — Telephone Encounter (Signed)
Referral placed as requested.

## 2021-08-02 NOTE — Telephone Encounter (Signed)
Pt's son called and was made aware that the referral was places as requested

## 2021-08-03 NOTE — Chronic Care Management (AMB) (Signed)
Chronic Care Management    Clinical Social Work Note  08/03/2021 Name: [name] MRN: [id number] DOB: Oct 26, 1931  [name] is a 88 y.o. year old male who is a primary care patient of [name], [name], MD. The CCM team was consulted to assist the patient with chronic disease management and/or care coordination needs related to: Level of Care Concerns.   Engaged with patient's son by telephone for follow up visit in response to provider referral for social work chronic care management and care coordination services.   Consent to Services:  The patient was given information about Chronic Care Management services, agreed to services, and gave verbal consent prior to initiation of services.  Please see initial visit note for detailed documentation.   Patient agreed to services and consent obtained.   Assessment: Review of patient past medical history, allergies, medications, and health status, including review of relevant consultants reports was performed today as part of a comprehensive evaluation and provision of chronic care management and care coordination services.     SDOH (Social Determinants of Health) assessments and interventions performed:    Advanced Directives Status: Not addressed in this encounter.  CCM Care Plan  No Known Allergies  Outpatient Encounter Medications as of 07/30/2021  Medication Sig   Accu-Chek Softclix Lancets lancets TEST BLOOD SUGAR EVERY DAY   acetaminophen (TYLENOL) 500 MG tablet Take 500 mg by mouth every 8 (eight) hours as needed for mild pain or headache.   Alcohol Swabs (ALCOHOL PREP) PADS USE TO TEST BLOOD GLUCOSE THREE TIMES DAILY   amLODipine (NORVASC) 10 MG tablet Take 1 tablet (10 mg total) by mouth daily.   aspirin EC 81 MG tablet Take 81 mg by mouth at bedtime.   atorvastatin (LIPITOR) 80 MG tablet TAKE 1 TABLET EVERY DAY  AT  6PM   BD PEN NEEDLE NANO U/F 32G X 4 MM MISC INJECT TWO TIMES DAILY AS NEEDED.    bisacodyl (DULCOLAX) 5 MG EC tablet Take 5 mg by mouth daily as needed for moderate constipation.   Blood Glucose Monitoring Suppl (ACCU-CHEK AVIVA PLUS) w/Device KIT USE AS DIRECTED   brimonidine (ALPHAGAN) 0.2 % ophthalmic solution Place 1 drop into both eyes 2 (two) times daily.   carvedilol (COREG) 12.5 MG tablet Take 1 tablet (12.5 mg total) by mouth 2 (two) times daily.   cloNIDine (CATAPRES) 0.1 MG tablet TAKE 1 TABLET AT BEDTIME   COLCRYS 0.6 MG tablet TAKE 1 TABLET EVERY DAY   dorzolamide-timolol (COSOPT) 22.3-6.8 MG/ML ophthalmic solution Place 1 drop into both eyes 2 (two) times daily.   Ferrous Sulfate (IRON) 325 (65 Fe) MG TABS Take 1 tablet by mouth in the morning, at noon, and at bedtime.   furosemide (LASIX) 80 MG tablet Take 1 tablet (80 mg total) by mouth daily. Please keep upcoming appointment in march 2023 for future refills. Thank you   glucose blood (ACCU-CHEK GUIDE) test strip TEST BLOOD SUGAR EVERY DAY   hydrALAZINE (APRESOLINE) 25 MG tablet TAKE 1 TABLET (25 MG TOTAL) BY MOUTH 3 (THREE) TIMES DAILY.   insulin glargine (LANTUS SOLOSTAR) 100 UNIT/ML Solostar Pen Inject 10-12 Units into the skin daily.   isosorbide mononitrate (IMDUR) 60 MG 24 hr tablet Take 1 tablet (60 mg total) by mouth in the morning and at bedtime.   Lancet Devices (ACCU-CHEK SOFTCLIX) lancets USE TO TEST BLOOD GLUCOSE THREE TIMES DAILY   latanoprost (XALATAN) 0.005 % ophthalmic solution Place 1 drop into both eyes at bedtime.  nitroGLYCERIN (NITROSTAT) 0.4 MG SL tablet Place 1 tablet (0.4 mg total) under the tongue every 5 (five) minutes as needed for chest pain (3 doses MAX).   omeprazole (PRILOSEC) 20 MG capsule Take 1 capsule (20 mg total) by mouth daily. **PLEASE CONTACT OFFICE TO SCHEDULE AN APPOINTMENT   OVER THE COUNTER MEDICATION Take 1 tablet by mouth daily. Prostate formula   pentoxifylline (TRENTAL) 400 MG CR tablet TAKE 1 TABLET TWICE DAILY AT 10AM AND 5PM   potassium chloride SA (KLOR-CON  M) 20 MEQ tablet TAKE 1 TABLET EVERY DAY   timolol (TIMOPTIC) 0.5 % ophthalmic solution Place 1 drop into both eyes in the morning and at bedtime.   [DISCONTINUED] insulin aspart (NOVOLOG) 100 UNIT/ML FlexPen Inject 5 Units into the skin 3 (three) times daily with meals.   Facility-Administered Encounter Medications as of 07/30/2021  Medication   cyanocobalamin ((VITAMIN B-12)) injection 1,000 mcg    Patient Active Problem List   Diagnosis Date Noted   Iron deficiency anemia due to chronic blood loss 07/09/2019   Personal history of arterial venous malformation (AVM) 07/09/2019   Acute on chronic respiratory failure with hypoxia (HCC)    Acute exacerbation of CHF (congestive heart failure) ([place]) 08/11/2018   Gout due to renal impairment involving toe 02/08/2017   Insulin dependent diabetes mellitus    Chronic combined systolic and diastolic heart failure (HCC)    Acute blood loss anemia 05/10/2015   CKD (chronic kidney disease) stage 5, GFR less than 15 ml/min (HCC)    B12 deficiency 06/27/2011   Peripheral vascular disease ([place]) 01/19/2007   Peptic ulcer disease- 2013 01/19/2007   Diabetes mellitus type 2, insulin dependent ([place]) 10/02/2006   Dyslipidemia 10/02/2006   Essential hypertension 10/02/2006   Coronary artery disease involving 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 ([place]) 10/02/2006   BENIGN PROSTATIC HYPERTROPHY 10/02/2006    Conditions to be addressed/monitored: CHF, HTN, DMII, and CKD Stage 5 ; Level of care concerns, Inability to perform ADL's independently, and Inability to perform IADL's independently  Care Plan : LCSW Plan of Care  Updates made by Rebekah [place], LCSW since 08/03/2021 12:00 AM     Problem: Quality of Life (General Plan of Care)      Goal: Quality of Life Maintained   Start Date: 07/16/2021  Expected End Date: 09/27/2021  This Visit's Progress: On track  Recent Progress: On track  Priority: High  Note:   Current Barriers:   Level of Care Concerns:Inability to perform IADL's independently and Inability to perform ADL's independently  CSW Clinical Goal(s):  Patient  and family will verbalize understanding of plan for management of CHF, HTN, DMII, and CKD Stage 5  through collaboration with Clinical Social Worker, provider, and care team.   Interventions: Pt's son endorses feelings of frustration that father hasn't obtained an aid, since initial request on 06/17/21. Pt currently receives Home Health services while he resides independently. Closest family resides 7 miles away [place] spoke with [employer] Mickel Baas, with [facility] [phone] There will be a prompt with a code [phone] to get directly to her) will contact PCP office to discuss the custodial services. [name] provided Mickel Baas with names he would like for her to speak with (PCP, CMA, Administrator, sports, etc) Inter-disciplinary care team collaboration (see longitudinal plan of care) Evaluation of current treatment plan related to  self management and patient's adherence to plan as established by provider Review resources, discussed options and provided patient information  about  Enhanced Benefits connected with insurance provider:(Family is requesting referral to aid service, [employer]) Solution-Focused Strategies employed:  Active listening / Reflection utilized  Emotional Support Provided Problem [place] strategies reviewed Caregiver stress acknowledged   Task & activities to accomplish goals: Contact PCP office with any questions or concerns Attend all scheduled provider appts Utilize healthy coping skills and/or supportive resources discussed           Follow Up Plan: SW will follow up with patient by phone over the next 1-2 weeks      Christa See, MSW, [facility] [email or web address] Phone [phone] 2:56 PM

## 2021-08-03 NOTE — Progress Notes (Signed)
[facility] RN NOTE  PATIENT NAME: MONISH HALIBURTON DOB: [birth year] MRN: [id number]  PRIMARY CARE PROVIDER: [name], [name], MD  RESPONSIBLE PARTY: [name] (son) Acct ID - Guarantor Home Phone Work Phone Relationship Acct Type  [id number] CHET, [id number] [id number]  Self P/F     [address] [place], [place], [place] [address]   Due to the COVID-19 crisis, this virtual check-in visit was done via telephone from my office and it was initiated and consent by this patient and or family.  RN telephonic encounter completed with patient and his son [name]. [name] reports that patient is becoming increasingly short of breath when ambulating with his walker and even with talking. He feels that it has gotten a little worse over the past week or two. His oxygen saturation is usually around 98% at rest but son suspects it drops with exertion. He also reports that he is currently working with is PCP and [employer] to get in home health care started for patient.  Dr. Mariea Clonts requested that I scheduled RN visit to check on patient this week as both of his son's will not be able to be back in town for about 2 weeks. RN visit scheduled for 08/05/21'@11a'$ .    (Duration of visit and documentation 15 minutes)   Daryl Eastern, RN BSN

## 2021-08-03 NOTE — Patient Instructions (Signed)
Visit Information  Thank you for taking time to visit with me today. Please don't hesitate to contact me if I can be of assistance to you before our next scheduled telephone appointment.  Following are the goals we discussed today:  Task & activities to accomplish goals: Contact PCP office with any questions or concerns Attend all scheduled provider appts Utilize healthy coping skills and/or supportive resources discussed  Our next appointment is by telephone on 08/12/21 at 1:15 PM  Please call the care guide team at [phone] if you need to cancel or reschedule your appointment.   If you are experiencing a Mental Health or [place] or need someone to talk to, please call 911   The patient verbalized understanding of instructions, educational materials, and care plan provided today and DECLINED offer to receive copy of patient instructions, educational materials, and care plan.   Christa See, MSW, [facility] [email or web address] Phone [phone] 2:59 PM

## 2021-08-05 ENCOUNTER — Other Ambulatory Visit: Payer: Self-pay | Admitting: *Deleted

## 2021-08-05 ENCOUNTER — Ambulatory Visit: Payer: Medicare HMO

## 2021-08-05 VITALS — BP 122/54 | HR 57 | Temp 97.8°F | Resp 20

## 2021-08-05 DIAGNOSIS — N185 Chronic kidney disease, stage 5: Secondary | ICD-10-CM

## 2021-08-05 DIAGNOSIS — I10 Essential (primary) hypertension: Secondary | ICD-10-CM

## 2021-08-05 DIAGNOSIS — E119 Type 2 diabetes mellitus without complications: Secondary | ICD-10-CM

## 2021-08-05 DIAGNOSIS — Z515 Encounter for palliative care: Secondary | ICD-10-CM

## 2021-08-05 DIAGNOSIS — R269 Unspecified abnormalities of gait and mobility: Secondary | ICD-10-CM

## 2021-08-05 DIAGNOSIS — I25709 Atherosclerosis of coronary artery bypass graft(s), unspecified, with unspecified angina pectoris: Secondary | ICD-10-CM

## 2021-08-05 NOTE — Chronic Care Management (AMB) (Signed)
Chronic Care Management   CCM RN Visit Note  08/05/2021 Name: [name] MRN: [id number] DOB: 1931/06/12  Subjective: [name] is a 88 y.o. year old male who is a primary care patient of [name], [name], MD. The care management team was consulted for assistance with disease management and care coordination needs.    Engaged with patient by telephone for follow up visit in response to provider referral for case management and/or care coordination services.   Consent to Services:  The patient was given information about Chronic Care Management services, agreed to services, and gave verbal consent prior to initiation of services.  Please see initial visit note for detailed documentation.   Patient agreed to services and verbal consent obtained.   Assessment: Review of patient past medical history, allergies, medications, health status, including review of consultants reports, laboratory and other test data, was performed as part of comprehensive evaluation and provision of chronic care management services.   SDOH (Social Determinants of Health) assessments and interventions performed:    CCM Care Plan  No Known Allergies  Outpatient Encounter Medications as of 08/05/2021  Medication Sig   Accu-Chek Softclix Lancets lancets TEST BLOOD SUGAR EVERY DAY   acetaminophen (TYLENOL) 500 MG tablet Take 500 mg by mouth every 8 (eight) hours as needed for mild pain or headache.   Alcohol Swabs (ALCOHOL PREP) PADS USE TO TEST BLOOD GLUCOSE THREE TIMES DAILY   amLODipine (NORVASC) 10 MG tablet Take 1 tablet (10 mg total) by mouth daily.   aspirin EC 81 MG tablet Take 81 mg by mouth at bedtime.   atorvastatin (LIPITOR) 80 MG tablet TAKE 1 TABLET EVERY DAY  AT  6PM   BD PEN NEEDLE NANO U/F 32G X 4 MM MISC INJECT TWO TIMES DAILY AS NEEDED.   bisacodyl (DULCOLAX) 5 MG EC tablet Take 5 mg by mouth daily as needed for moderate constipation.   Blood Glucose Monitoring Suppl  (ACCU-CHEK AVIVA PLUS) w/Device KIT USE AS DIRECTED   brimonidine (ALPHAGAN) 0.2 % ophthalmic solution Place 1 drop into both eyes 2 (two) times daily.   carvedilol (COREG) 12.5 MG tablet Take 1 tablet (12.5 mg total) by mouth 2 (two) times daily.   cloNIDine (CATAPRES) 0.1 MG tablet TAKE 1 TABLET AT BEDTIME   COLCRYS 0.6 MG tablet TAKE 1 TABLET EVERY DAY   dorzolamide-timolol (COSOPT) 22.3-6.8 MG/ML ophthalmic solution Place 1 drop into both eyes 2 (two) times daily.   Ferrous Sulfate (IRON) 325 (65 Fe) MG TABS Take 1 tablet by mouth in the morning, at noon, and at bedtime.   furosemide (LASIX) 80 MG tablet Take 1 tablet (80 mg total) by mouth daily. Please keep upcoming appointment in march 2023 for future refills. Thank you   glucose blood (ACCU-CHEK GUIDE) test strip TEST BLOOD SUGAR EVERY DAY   hydrALAZINE (APRESOLINE) 25 MG tablet TAKE 1 TABLET (25 MG TOTAL) BY MOUTH 3 (THREE) TIMES DAILY.   insulin glargine (LANTUS SOLOSTAR) 100 UNIT/ML Solostar Pen Inject 10-12 Units into the skin daily.   isosorbide mononitrate (IMDUR) 60 MG 24 hr tablet Take 1 tablet (60 mg total) by mouth in the morning and at bedtime.   Lancet Devices (ACCU-CHEK SOFTCLIX) lancets USE TO TEST BLOOD GLUCOSE THREE TIMES DAILY   latanoprost (XALATAN) 0.005 % ophthalmic solution Place 1 drop into both eyes at bedtime.    nitroGLYCERIN (NITROSTAT) 0.4 MG SL tablet Place 1 tablet (0.4 mg total) under the tongue every 5 (five) minutes as  needed for chest pain (3 doses MAX).   omeprazole (PRILOSEC) 20 MG capsule Take 1 capsule (20 mg total) by mouth daily. **PLEASE CONTACT OFFICE TO SCHEDULE AN APPOINTMENT   OVER THE COUNTER MEDICATION Take 1 tablet by mouth daily. Prostate formula   pentoxifylline (TRENTAL) 400 MG CR tablet TAKE 1 TABLET TWICE DAILY AT 10AM AND 5PM   potassium chloride SA (KLOR-CON M) 20 MEQ tablet TAKE 1 TABLET EVERY DAY   timolol (TIMOPTIC) 0.5 % ophthalmic solution Place 1 drop into both eyes in the morning  and at bedtime.   Facility-Administered Encounter Medications as of 08/05/2021  Medication   cyanocobalamin ((VITAMIN B-12)) injection 1,000 mcg    Patient Active Problem List   Diagnosis Date Noted   Iron deficiency anemia due to chronic blood loss 07/09/2019   Personal history of arterial venous malformation (AVM) 07/09/2019   Acute on chronic respiratory failure with hypoxia (HCC)    Acute exacerbation of CHF (congestive heart failure) ([place]) 08/11/2018   Gout due to renal impairment involving toe 02/08/2017   Insulin dependent diabetes mellitus    Chronic combined systolic and diastolic heart failure (HCC)    Acute blood loss anemia 05/10/2015   CKD (chronic kidney disease) stage 5, GFR less than 15 ml/min (HCC)    B12 deficiency 06/27/2011   Peripheral vascular disease ([place]) 01/19/2007   Peptic ulcer disease- 2013 01/19/2007   Diabetes mellitus type 2, insulin dependent ([place]) 10/02/2006   Dyslipidemia 10/02/2006   Essential hypertension 10/02/2006   Coronary artery disease involving 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 ([place]) 10/02/2006   BENIGN PROSTATIC HYPERTROPHY 10/02/2006    Conditions to be addressed/monitored:CAD, HTN, DMII, and CKD Stage 5  Care Plan : [facility] Care Plan  Updates made by Dimitri Ped, RN since 08/05/2021 12:00 AM     Problem: Chronic Disease Managment Needs Related to HTN, CKD and Diabetes   Priority: High  Onset Date: 06/30/2021     Long-Range Goal: Patient will Work with RN Care Manager Regarding Care Management and Care Coordination Associated with HTN, DM, and CKD   Start Date: 06/30/2021  Expected End Date: 07/01/2022  Recent Progress: On track  Priority: High  Note:   Current Barriers:  Chronic Disease Management support and education needs related to HTN, DMII, and CKD Stage 5, CAD States that a nurse was out to see him today.  States he is using his walker and has not had any falls.  States he tries to check his CBG  every 2-3 days but he does not remember what his numbers were.  Denies any recent low readings.  States he eats a good breakfast and not as much during the day.  States he is drinking an Ensure daily.  States he does get short of breath when walking but denies any chest pains or swelling.  States the nurse checked his B/P today and the top number was 124 but he does not remember the bottom number.  States his granddaughter usually checks on him daily Pt has Palliative care visits  RNCM Clinical Goal(s):  Patient will verbalize understanding of plan for management of CAD, HTN, DMII, and CKD Stage 5 as evidenced by voiced adherence to plan of care verbalize basic understanding of CAD, HTN, HLD, DMII, and CKD Stage 5 disease process and self health management plan as evidenced by self reported blood sugar readings within normal range and an A1C < 7 take all medications exactly as prescribed and will  call provider for medication related questions as evidenced by dispense report and pt verbalization     attend all scheduled medical appointments: CCM LCSW 08/12/21, cardiology 10/27/21 as evidenced by medical records        demonstrate improved adherence to prescribed treatment plan for CAD, HTN, HLD, DMII, and CKD Stage 5 as evidenced by readings within limits, voiced adherence to plan of care continue to work with RN Care Manager and/or Social Worker to address care management and care coordination needs related to CAD, HTN, HLD, DMII, and CKD Stage 5 as evidenced by adherence to CM Team Scheduled appointments     through collaboration with PharmD, provider, and care team.   Interventions: 1:1 collaboration with primary care provider regarding development and update of comprehensive plan of care as evidenced by provider attestation and co-signature Inter-disciplinary care team collaboration (see longitudinal plan of care) Evaluation of current treatment plan related to  self management and patient's adherence  to plan as established by provider   CAD Interventions: (Status:  New goal.) Long Term Goal Assessed understanding of CAD diagnosis Medications reviewed including medications utilized in CAD treatment plan Provided education on importance of blood pressure control in management of CAD Reviewed Importance of taking all medications as prescribed Reviewed Importance of attending all scheduled provider appointments Advised to report any changes in symptoms or exercise tolerance   Diabetes Interventions:  (Status:  Goal on track:  Yes.) Long Term Goal Assessed patient's understanding of A1c goal: <7% Provided education to patient about basic DM disease process Reviewed medications with patient and discussed importance of medication adherence Discussed plans with patient for ongoing care management follow up and provided patient with direct contact information for care management team Advised patient, providing education and rationale, to check cbg daily and record, calling provider for findings outside established parameters Referral made to pharmacy team for assistance with being followed by CCM PharmD Referral made to social work team for assistance with working with CCM LCSW to get assistance in home Lab Results  Component Value Date   HGBA1C 6.2 (A) 07/14/2021    Falls:  (Status: New goal.) Long Term Goal  Reviewed medications and discussed potential side effects of medications such as dizziness and frequent urination Advised patient of importance of notifying provider of falls Assessed for signs and symptoms of orthostatic hypotension Assessed for falls since last encounter Assessed social determinant of health barriers Fall assessment performed. Patient had one mechanical fall getting out of the shower within the past year. No injury. Reviewed fall precautions and to use walker at all times   Hypertension: (Status: Goal on Track (progressing): YES.) Long Term Goal  Last practice  recorded BP readings:  BP Readings from Last 3 Encounters:  07/14/21 (!) 110/58  07/08/21 130/60  06/16/21 (!) 112/58  Most recent eGFR/CrCl: No results found for: EGFR  No components found for: CRCL  Evaluation of current treatment plan related to hypertension self management and patient's adherence to plan as established by provider;   Reviewed prescribed diet low sodium low CHO Reviewed medications with patient and discussed importance of compliance;  Counseled on the importance of exercise goals with target of 150 minutes per week Discussed plans with patient for ongoing care management follow up and provided patient with direct contact information for care management team; Advised patient, providing education and rationale, to monitor blood pressure daily and record, calling PCP for findings outside established parameters;  Discussed complications of poorly controlled blood pressure such as heart  disease, stroke, circulatory complications, vision complications, kidney impairment, sexual dysfunction;  Reviewed to get up slowly and to drink adequate amounts of fluids   Patient Goals/Self-Care Activities: Take all medications as prescribed Attend all scheduled provider appointments Call pharmacy for medication refills 3-7 days in advance of running out of medications Perform all self care activities independently  Call provider office for new concerns or questions  check blood sugar at prescribed times: once daily and when you have symptoms of low or high blood sugar check feet daily for cuts, sores or redness enter blood sugar readings and medication or insulin into daily log fill half of plate with vegetables keep feet up while sitting check blood pressure daily choose a place to take my blood pressure (home, clinic or office, retail store) write blood pressure results in a log or diary take blood pressure log to all doctor appointments call doctor for signs and symptoms of high  blood pressure keep all doctor appointments  Follow Up Plan:  Telephone follow up appointment with care management team member scheduled for:  09/02/21 The patient has been provided with contact information for the care management team and has been advised to call with any health related questions or concerns.        Plan:Telephone follow up appointment with care management team member scheduled for:  09/02/21 The patient has been provided with contact information for the care management team and has been advised to call with any health related questions or concerns.  Peter Garter RN, Jackquline [place], CDE Care Management Coordinator [facility] [phone]

## 2021-08-05 NOTE — Patient Instructions (Addendum)
Visit Information  Thank you for taking time to visit with me today. Please don't hesitate to contact me if I can be of assistance to you before our next scheduled telephone appointment.  Following are the goals we discussed today:  Take all medications as prescribed Attend all scheduled provider appointments Call pharmacy for medication refills 3-7 days in advance of running out of medications Perform all self care activities independently  Call provider office for new concerns or questions  check blood sugar at prescribed times: once daily and when you have symptoms of low or high blood sugar check feet daily for cuts, sores or redness enter blood sugar readings and medication or insulin into daily log fill half of plate with vegetables keep feet up while sitting check blood pressure daily choose a place to take my blood pressure (home, clinic or office, retail store) write blood pressure results in a log or diary take blood pressure log to all doctor appointments call doctor for signs and symptoms of high blood pressure keep all doctor appointments Living With Diabetes Diabetes (type 1 diabetes mellitus or type 2 diabetes mellitus) is a condition in which the body does not have enough of a hormone called insulin, or the body does not respond properly to insulin. Normally, insulin allows sugars (glucose) to enter cells in the body. With diabetes, extra glucose builds up in the blood instead of going into cells. This results in high blood glucose (hyperglycemia). How to manage lifestyle changes Managing diabetes includes medical treatments as well as lifestyle changes. If diabetes is not managed well, serious physical and emotional complications can occur. Taking good care of yourself means that you are responsible for: Monitoring glucose regularly. Eating a healthy diet. Exercising regularly. Meeting with health care providers. Taking medicines as directed. Most people feel some  stress about managing their diabetes. When this stress becomes too much, it is known as diabetes-related distress. This is very common. Living with diabetes can place you at risk for diabetes distress, depression, or anxiety. These disorders can make diabetes more difficult to manage. How to recognize stress You may have diabetes distress if you: Avoid or ignore your daily diabetes care. This includes glucose testing, following a meal plan, and taking medications. Feel overwhelmed by your daily diabetes care. Experience emotional reactions such as anger, sadness, or fear related to your daily diabetes care. Feel fear or shame about not doing everything perfectly that you have been told to do. Emotional distress Symptoms of diabetes distress include: Anger about having a diagnosis of diabetes. Fear or frustration about your diagnosis and the changes you need to make to manage the condition. Being overly worried about the care that you need or the cost of the care that you need. Feeling like you caused your condition by doing something wrong. Fear about unpredictable fluctuations in your blood glucose, like low or high blood glucose. Feeling judged by your health care providers. Feeling very alone with the disease. Depression Having diabetes means that you are at a higher risk for depression. Your health care provider may test (screen) you for symptoms of depression. It is important to recognize symptoms and to start treatment for depression soon after it is diagnosed. The following are some symptoms of depression: Loss of interest in things that you used to enjoy. Feeling depressed much or most of the time. A change in appetite. Trouble getting to sleep or staying asleep. Feeling tired most of the day. Feeling nervous and anxious. Feeling guilty and  worrying that you are a burden to others. Having thoughts of hurting yourself or feeling that you want to die. If you have any of these  symptoms, more days than not, for 2 weeks or longer, you may have depression. This would be a good time to contact your health care provider. Follow these instructions at home: Managing diabetes distress The following are some ways to manage emotional distress: Learn as much as you can about diabetes and its treatment. Take one step at a time to improve your management. Meet with a certified diabetes care and education specialist. Take a class to learn how to manage your condition. Consider working with a counselor or therapist. Keep a journal of your thoughts and concerns. Accept that some things are out of your control. Talk with other people who have diabetes. It can help to talk about the distress that you feel. Find ways to manage stress that work for you. These may include art or music therapy, exercise, meditation, and hobbies. Seek support from spiritual leaders, family, and friends.  General instructions Do your best to follow your diabetes management plan. If you are struggling to follow your plan, talk with a certified diabetes care and education specialist, or with someone else who has diabetes. They may have ideas that will help. Forgive yourself for not being perfect. Almost everyone struggles with the tasks of diabetes. Keep all follow-up visits. This is important. Where to find support Search for information and support from the American Diabetes Association: www.diabetes.org Find a certified diabetes education and care specialist. Make an appointment through the Association of Diabetes Care & Education Specialists: www.diabeteseducator.org Contact a health care provider if: You believe your diabetes is getting out of control. You are concerned you may be depressed. You think your medications are not helping control your diabetes. You are feeling overwhelmed with your diabetes. Get help right away if: You have thoughts about hurting yourself or others. If you ever feel  like you may hurt yourself or others, or have thoughts about taking your own life, get help right away. You can go to your nearest emergency department or call: Your local emergency services (911 in the [place] A suicide crisis helpline, such as the [place] at 401-113-1049 or 988 in the [place]. This is open 24 hours a day. Summary Diabetes (type 1 diabetes mellitus or type 2 diabetes mellitus) is a condition in which the body does not have enough of a hormone called insulin, or the body does not respond properly to insulin. Living with diabetes puts you at risk for medical and emotional issues, such as diabetes distress, depression, and anxiety. Recognizing the symptoms of diabetes distress and depression may help you avoid problems with your diabetes control. If you experience symptoms, it is important to discuss this with your health care provider, certified diabetes care and education specialist, or therapist. It is important to start treatment for diabetes distress and depression soon after diagnosis. Ask your health care provider to recommend a therapist who understands both depression and diabetes. This information is not intended to replace advice given to you by your health care provider. Make sure you discuss any questions you have with your health care provider. Document Revised: 09/09/2020 Document Reviewed: 06/27/2019 Elsevier Patient Education  [place] next appointment is by telephone on 09/02/21 at 1:15 PM  Please call the care guide team at [phone] if you need to cancel or reschedule your appointment.   If you are  experiencing a Mental Health or [place] or need someone to talk to, please call the Suicide and Crisis Lifeline: 988 call the [place] National Suicide Prevention Lifeline: (365)480-0286 or TTY: 952-784-0963 TTY 575-239-2578) to talk to a trained counselor call 1-800-273-TALK (toll free, 24 hour hotline) go to  [facility] [place] [phone]) call 911   The patient verbalized understanding of instructions, educational materials, and care plan provided today and agreed to receive a mailed copy of patient instructions, educational materials, and care plan.   Peter Garter RN, Jackquline Denmark, CDE Care Management Coordinator [facility] [phone]

## 2021-08-09 NOTE — Progress Notes (Signed)
[facility] RN NOTE  PATIENT NAME: [name] DOB: 1931/07/15 MRN: [id number]  PRIMARY CARE PROVIDER: [name], [name], MD  RESPONSIBLE PARTY: [name] (son) Acct ID - Guarantor Home Phone Work Phone Relationship Acct Type  [id number] TRACEY, [phone]  Self P/F     [address] [place], [place], [place] [address]    RN face to face visit completed with patient in his home as requested by Dr. Hollace Kinnier. Son's are concerned that patient is becoming increasingly more short of breath with exertion and questions whether he could benefit from oxygen. Oxygen saturation on room air at rest is 100%. I had patient ambulate with his rollator around in his condo several times. Oxygen sat on dropped down to 99%.   He remains able to bathe and dress himself but tires very easily. He still drives. Had a fall 6 months ago but nothing recent. He is eating 2 meals/day and will snack on occasion. His blood sugar today was 200. He administers his own insulin. He lives alone but has family checking in on him regularly. He has Life Alert. Family is working on getting caregiver services started covered by his insurance company.  I called and spoke with his son [name] to provide patient update. Explained that in order for him to qualify for oxygen his level would have to be 88% or below. He verbalized understanding and appreciative of update. Palliative care will continue to follow.  PHYSICAL EXAM:   VITALS: Today's Vitals   08/05/21 1145  BP: (!) 122/54  Pulse: (!) 57  Resp: 20  Temp: 97.8 F (36.6 C)  TempSrc: Temporal  SpO2: 100%  PainSc: 0-No pain    LUNGS: clear to auscultation  CARDIAC: Cor Loletha Grayer EXTREMITIES: No edema SKIN:  Exposed skin is dry and intact; denies skin issues   NEURO:  Alert and oriented x 3, pleasant mood, forgetful at times, generalized weakness, ambulates with rollator walker   (Duration of visit and  documentation 60 minutes)    Daryl Eastern, RN BSN

## 2021-08-12 ENCOUNTER — Ambulatory Visit: Payer: Medicare HMO | Admitting: Licensed Clinical Social Worker

## 2021-08-12 ENCOUNTER — Other Ambulatory Visit: Payer: Self-pay | Admitting: *Deleted

## 2021-08-12 ENCOUNTER — Telehealth: Payer: Self-pay | Admitting: Internal Medicine

## 2021-08-12 DIAGNOSIS — N185 Chronic kidney disease, stage 5: Secondary | ICD-10-CM

## 2021-08-12 DIAGNOSIS — E119 Type 2 diabetes mellitus without complications: Secondary | ICD-10-CM

## 2021-08-12 NOTE — Telephone Encounter (Signed)
More tired, short of breath, incontinence, weight loss. 149lbs in feb, today 135lbs sons chauncey and larry would like an order for a hospice consult. If agreed, would provider be the attending?

## 2021-08-12 NOTE — Telephone Encounter (Signed)
Verbal orders given to [place] for hospice care

## 2021-08-13 ENCOUNTER — Telehealth: Payer: Self-pay | Admitting: Internal Medicine

## 2021-08-13 NOTE — Telephone Encounter (Signed)
Calling to see if provider wants to do her own comfort care or if you want the hospice physican to do them

## 2021-08-13 NOTE — Chronic Care Management (AMB) (Cosign Needed)
Chronic Care Management    Clinical Social Work Note  08/13/2021 Name: [name] MRN: [id number] DOB: 14-Jan-1932  [name] is a 88 y.o. year old male who is a primary care patient of [name], [name], MD. The CCM team was consulted to assist the patient with chronic disease management and/or care coordination needs related to: Level of Care Concerns.   Engaged with patient's son by telephone for follow up visit in response to provider referral for social work chronic care management and care coordination services.   Consent to Services:  The patient was given information about Chronic Care Management services, agreed to services, and gave verbal consent prior to initiation of services.  Please see initial visit note for detailed documentation.   Patient agreed to services and consent obtained.   Assessment: Review of patient past medical history, allergies, medications, and health status, including review of relevant consultants reports was performed today as part of a comprehensive evaluation and provision of chronic care management and care coordination services.     SDOH (Social Determinants of Health) assessments and interventions performed:    Advanced Directives Status: Not addressed in this encounter.  CCM Care Plan  No Known Allergies  Outpatient Encounter Medications as of 08/12/2021  Medication Sig   Accu-Chek Softclix Lancets lancets TEST BLOOD SUGAR EVERY DAY   acetaminophen (TYLENOL) 500 MG tablet Take 500 mg by mouth every 8 (eight) hours as needed for mild pain or headache.   Alcohol Swabs (ALCOHOL PREP) PADS USE TO TEST BLOOD GLUCOSE THREE TIMES DAILY   amLODipine (NORVASC) 10 MG tablet Take 1 tablet (10 mg total) by mouth daily.   aspirin EC 81 MG tablet Take 81 mg by mouth at bedtime.   atorvastatin (LIPITOR) 80 MG tablet TAKE 1 TABLET EVERY DAY  AT  6PM   BD PEN NEEDLE NANO U/F 32G X 4 MM MISC INJECT TWO TIMES DAILY AS NEEDED.    bisacodyl (DULCOLAX) 5 MG EC tablet Take 5 mg by mouth daily as needed for moderate constipation.   Blood Glucose Monitoring Suppl (ACCU-CHEK AVIVA PLUS) w/Device KIT USE AS DIRECTED   brimonidine (ALPHAGAN) 0.2 % ophthalmic solution Place 1 drop into both eyes 2 (two) times daily.   carvedilol (COREG) 12.5 MG tablet Take 1 tablet (12.5 mg total) by mouth 2 (two) times daily.   cloNIDine (CATAPRES) 0.1 MG tablet TAKE 1 TABLET AT BEDTIME   COLCRYS 0.6 MG tablet TAKE 1 TABLET EVERY DAY   dorzolamide-timolol (COSOPT) 22.3-6.8 MG/ML ophthalmic solution Place 1 drop into both eyes 2 (two) times daily.   Ferrous Sulfate (IRON) 325 (65 Fe) MG TABS Take 1 tablet by mouth in the morning, at noon, and at bedtime.   furosemide (LASIX) 80 MG tablet Take 1 tablet (80 mg total) by mouth daily. Please keep upcoming appointment in march 2023 for future refills. Thank you   glucose blood (ACCU-CHEK GUIDE) test strip TEST BLOOD SUGAR EVERY DAY   hydrALAZINE (APRESOLINE) 25 MG tablet TAKE 1 TABLET (25 MG TOTAL) BY MOUTH 3 (THREE) TIMES DAILY.   insulin glargine (LANTUS SOLOSTAR) 100 UNIT/ML Solostar Pen Inject 10-12 Units into the skin daily.   isosorbide mononitrate (IMDUR) 60 MG 24 hr tablet Take 1 tablet (60 mg total) by mouth in the morning and at bedtime.   Lancet Devices (ACCU-CHEK SOFTCLIX) lancets USE TO TEST BLOOD GLUCOSE THREE TIMES DAILY   latanoprost (XALATAN) 0.005 % ophthalmic solution Place 1 drop into both eyes at bedtime.  nitroGLYCERIN (NITROSTAT) 0.4 MG SL tablet Place 1 tablet (0.4 mg total) under the tongue every 5 (five) minutes as needed for chest pain (3 doses MAX).   omeprazole (PRILOSEC) 20 MG capsule Take 1 capsule (20 mg total) by mouth daily. **PLEASE CONTACT OFFICE TO SCHEDULE AN APPOINTMENT   OVER THE COUNTER MEDICATION Take 1 tablet by mouth daily. Prostate formula   pentoxifylline (TRENTAL) 400 MG CR tablet TAKE 1 TABLET TWICE DAILY AT 10AM AND 5PM   potassium chloride SA (KLOR-CON  M) 20 MEQ tablet TAKE 1 TABLET EVERY DAY   timolol (TIMOPTIC) 0.5 % ophthalmic solution Place 1 drop into both eyes in the morning and at bedtime.   Facility-Administered Encounter Medications as of 08/12/2021  Medication   cyanocobalamin ((VITAMIN B-12)) injection 1,000 mcg    Patient Active Problem List   Diagnosis Date Noted   Iron deficiency anemia due to chronic blood loss 07/09/2019   Personal history of arterial venous malformation (AVM) 07/09/2019   Acute on chronic respiratory failure with hypoxia (HCC)    Acute exacerbation of CHF (congestive heart failure) ([place]) 08/11/2018   Gout due to renal impairment involving toe 02/08/2017   Insulin dependent diabetes mellitus    Chronic combined systolic and diastolic heart failure (HCC)    Acute blood loss anemia 05/10/2015   CKD (chronic kidney disease) stage 5, GFR less than 15 ml/min (HCC)    B12 deficiency 06/27/2011   Peripheral vascular disease ([place]) 01/19/2007   Peptic ulcer disease- 2013 01/19/2007   Diabetes mellitus type 2, insulin dependent ([place]) 10/02/2006   Dyslipidemia 10/02/2006   Essential hypertension 10/02/2006   Coronary artery disease involving 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 ([place]) 10/02/2006   BENIGN PROSTATIC HYPERTROPHY 10/02/2006    Conditions to be addressed/monitored: CHF, DMII, and CKD Stage 5  Care Plan : LCSW Plan of Care  Updates made by Rebekah [place], LCSW since 08/13/2021 12:00 AM     Problem: Quality of Life (General Plan of Care)      Goal: Quality of Life Maintained Completed 08/12/2021  Start Date: 07/16/2021  Expected End Date: 09/27/2021  This Visit's Progress: On track  Recent Progress: On track  Priority: High  Note:   Current Barriers:  Level of Care Concerns:Inability to perform IADL's independently and Inability to perform ADL's independently  CSW Clinical Goal(s):  Patient  and family will verbalize understanding of plan for management of CHF, HTN,  DMII, and CKD Stage 5  through collaboration with Clinical Social Worker, provider, and care team.   Interventions: Pt's son endorses feelings of frustration that father hasn't obtained an aid, since initial request on 06/17/21. Pt currently receives Home Health services while he resides independently. Closest family resides 9 miles away Per [name], PCP sent a referral to [place] and services were denied by [facility]. Pt's son agreed to continue working with [facility] to inquire about any additional custodial services, pt may qualify for. He is expecting a call from [facility] today No additional resource needs identified Inter-disciplinary care team collaboration (see longitudinal plan of care) Evaluation of current treatment plan related to  self management and patient's adherence to plan as established by provider Review resources, discussed options and provided patient information about  Enhanced Benefits connected with insurance provider:(Family is requesting referral to aid service, [facility]) Solution-Focused Strategies employed:  Active listening / Reflection utilized  Emotional Support Provided Problem [place] strategies reviewed Caregiver stress acknowledged   Task & activities  to accomplish goals: Contact PCP office with any questions or concerns Attend all scheduled provider appts Utilize healthy coping skills and/or supportive resources discussed           Follow Up Plan: No follow up required      Christa See, MSW, [place] [facility] [email or web address] Phone [phone] 12:41 PM

## 2021-08-13 NOTE — Patient Instructions (Signed)
Visit Information  Thank you for taking time to visit with me today. Please don't hesitate to contact me if I can be of assistance to you before our next scheduled telephone appointment.  Following are the goals we discussed today:  Task & activities to accomplish goals: Contact PCP office with any questions or concerns Attend all scheduled provider appts Utilize healthy coping skills and/or supportive resources discussed  No follow up required.   If you are experiencing a Mental Health or [place] or need someone to talk to, please call 911   The patient verbalized understanding of instructions, educational materials, and care plan provided today and DECLINED offer to receive copy of patient instructions, educational materials, and care plan.   Christa See, MSW, [place] [facility] [email or web address] Phone [phone] 12:41 PM

## 2021-08-16 NOTE — Telephone Encounter (Signed)
Called and informed Sherrie that Dr Jerilee Hoh will be the attending physician.

## 2021-08-20 ENCOUNTER — Telehealth: Payer: Self-pay | Admitting: Interventional Cardiology

## 2021-08-20 ENCOUNTER — Telehealth: Payer: Self-pay | Admitting: Internal Medicine

## 2021-08-23 ENCOUNTER — Other Ambulatory Visit: Payer: Self-pay | Admitting: Interventional Cardiology

## 2021-08-25 ENCOUNTER — Telehealth: Payer: Self-pay | Admitting: Internal Medicine

## 2021-08-25 NOTE — Telephone Encounter (Signed)
Requesting refill of  aspirin EC 81 MG tablet and potassium chloride SA (KLOR-CON M) 20 MEQ tablet   [facility] - [place], [place] Phone:  [phone]  Fax:  [phone]

## 2021-08-26 MED ORDER — ASPIRIN 81 MG PO TBEC: 81.0000 mg | DELAYED_RELEASE_TABLET | Freq: Every day | ORAL | 1 refills | Status: AC

## 2021-08-26 MED ORDER — POTASSIUM CHLORIDE CRYS ER 20 MEQ PO TBCR: 20.0000 meq | EXTENDED_RELEASE_TABLET | Freq: Every day | ORAL | 1 refills | Status: AC

## 2021-08-27 DIAGNOSIS — E1122 Type 2 diabetes mellitus with diabetic chronic kidney disease: Secondary | ICD-10-CM | POA: Diagnosis not present

## 2021-08-27 DIAGNOSIS — E1159 Type 2 diabetes mellitus with other circulatory complications: Secondary | ICD-10-CM

## 2021-08-27 DIAGNOSIS — D509 Iron deficiency anemia, unspecified: Secondary | ICD-10-CM

## 2021-08-27 DIAGNOSIS — I2510 Atherosclerotic heart disease of native coronary artery without angina pectoris: Secondary | ICD-10-CM

## 2021-08-27 DIAGNOSIS — H409 Unspecified glaucoma: Secondary | ICD-10-CM

## 2021-08-27 DIAGNOSIS — Z794 Long term (current) use of insulin: Secondary | ICD-10-CM

## 2021-08-27 DIAGNOSIS — N184 Chronic kidney disease, stage 4 (severe): Secondary | ICD-10-CM

## 2021-08-27 DIAGNOSIS — I5040 Unspecified combined systolic (congestive) and diastolic (congestive) heart failure: Secondary | ICD-10-CM | POA: Diagnosis not present

## 2021-08-27 DIAGNOSIS — I130 Hypertensive heart and chronic kidney disease with heart failure and stage 1 through stage 4 chronic kidney disease, or unspecified chronic kidney disease: Secondary | ICD-10-CM

## 2021-08-27 DIAGNOSIS — E785 Hyperlipidemia, unspecified: Secondary | ICD-10-CM | POA: Diagnosis not present

## 2021-08-29 ENCOUNTER — Other Ambulatory Visit: Payer: Self-pay | Admitting: Interventional Cardiology

## 2021-09-01 ENCOUNTER — Ambulatory Visit: Payer: Medicare HMO | Admitting: Interventional Cardiology

## 2021-09-02 ENCOUNTER — Ambulatory Visit: Payer: Medicare HMO

## 2021-09-02 DIAGNOSIS — I25709 Atherosclerosis of coronary artery bypass graft(s), unspecified, with unspecified angina pectoris: Secondary | ICD-10-CM

## 2021-09-02 DIAGNOSIS — E119 Type 2 diabetes mellitus without complications: Secondary | ICD-10-CM

## 2021-09-02 NOTE — Chronic Care Management (AMB) (Signed)
Chronic Care Management   CCM RN Visit Note  09/02/2021 Name: [name] MRN: [id number] DOB: 1931-11-20  Subjective: [name] is a 88 y.o. year old male who is a primary care patient of [name], [name], MD. The care management team was consulted for assistance with disease management and care coordination needs.    Engaged with patient by telephone for  Case closure in Hospice care  in response to provider referral for case management and/or care coordination services.   Consent to Services:  The patient was given information about Chronic Care Management services, agreed to services, and gave verbal consent prior to initiation of services.  Please see initial visit note for detailed documentation.   Patient agreed to services and verbal consent obtained.   Assessment: Review of patient past medical history, allergies, medications, health status, including review of consultants reports, laboratory and other test data, was performed as part of comprehensive evaluation and provision of chronic care management services.   SDOH (Social Determinants of Health) assessments and interventions performed:    CCM Care Plan  No Known Allergies  Outpatient Encounter Medications as of 09/02/2021  Medication Sig   Accu-Chek Softclix Lancets lancets TEST BLOOD SUGAR EVERY DAY   acetaminophen (TYLENOL) 500 MG tablet Take 500 mg by mouth every 8 (eight) hours as needed for mild pain or headache.   Alcohol Swabs (ALCOHOL PREP) PADS USE TO TEST BLOOD GLUCOSE THREE TIMES DAILY   amLODipine (NORVASC) 10 MG tablet Take 1 tablet (10 mg total) by mouth daily.   aspirin EC 81 MG tablet Take 1 tablet (81 mg total) by mouth daily. Swallow whole.   atorvastatin (LIPITOR) 80 MG tablet TAKE 1 TABLET EVERY DAY  AT  6PM   BD PEN NEEDLE NANO U/F 32G X 4 MM MISC INJECT TWO TIMES DAILY AS NEEDED.   bisacodyl (DULCOLAX) 5 MG EC tablet Take 5 mg by mouth daily as needed for moderate  constipation.   Blood Glucose Monitoring Suppl (ACCU-CHEK AVIVA PLUS) w/Device KIT USE AS DIRECTED   brimonidine (ALPHAGAN) 0.2 % ophthalmic solution Place 1 drop into both eyes 2 (two) times daily.   carvedilol (COREG) 12.5 MG tablet TAKE 1 TABLET TWICE DAILY   cloNIDine (CATAPRES) 0.1 MG tablet TAKE 1 TABLET AT BEDTIME   COLCRYS 0.6 MG tablet TAKE 1 TABLET EVERY DAY   dorzolamide-timolol (COSOPT) 22.3-6.8 MG/ML ophthalmic solution Place 1 drop into both eyes 2 (two) times daily.   furosemide (LASIX) 80 MG tablet Take 1 tablet (80 mg total) by mouth daily.   glucose blood (ACCU-CHEK GUIDE) test strip TEST BLOOD SUGAR EVERY DAY   hydrALAZINE (APRESOLINE) 25 MG tablet TAKE 1 TABLET (25 MG TOTAL) BY MOUTH 3 (THREE) TIMES DAILY.   insulin glargine (LANTUS SOLOSTAR) 100 UNIT/ML Solostar Pen Inject 10-12 Units into the skin daily.   isosorbide mononitrate (IMDUR) 60 MG 24 hr tablet Take 1 tablet (60 mg total) by mouth in the morning and at bedtime.   Lancet Devices (ACCU-CHEK SOFTCLIX) lancets USE TO TEST BLOOD GLUCOSE THREE TIMES DAILY   latanoprost (XALATAN) 0.005 % ophthalmic solution Place 1 drop into both eyes at bedtime.    nitroGLYCERIN (NITROSTAT) 0.4 MG SL tablet Place 1 tablet (0.4 mg total) under the tongue every 5 (five) minutes as needed for chest pain (3 doses MAX).   omeprazole (PRILOSEC) 20 MG capsule Take 1 capsule (20 mg total) by mouth daily. **PLEASE CONTACT OFFICE TO SCHEDULE AN APPOINTMENT   OVER THE  COUNTER MEDICATION Take 1 tablet by mouth daily. Prostate formula   pentoxifylline (TRENTAL) 400 MG CR tablet TAKE 1 TABLET TWICE DAILY AT 10AM AND 5PM   potassium chloride SA (KLOR-CON M) 20 MEQ tablet Take 1 tablet (20 mEq total) by mouth daily.   timolol (TIMOPTIC) 0.5 % ophthalmic solution Place 1 drop into both eyes in the morning and at bedtime.   Facility-Administered Encounter Medications as of 09/02/2021  Medication   cyanocobalamin ((VITAMIN B-12)) injection 1,000 mcg     Patient Active Problem List   Diagnosis Date Noted   Iron deficiency anemia due to chronic blood loss 07/09/2019   Personal history of arterial venous malformation (AVM) 07/09/2019   Acute on chronic respiratory failure with hypoxia (HCC)    Acute exacerbation of CHF (congestive heart failure) ([place]) 08/11/2018   Gout due to renal impairment involving toe 02/08/2017   Insulin dependent diabetes mellitus    Chronic combined systolic and diastolic heart failure (HCC)    Acute blood loss anemia 05/10/2015   CKD (chronic kidney disease) stage 5, GFR less than 15 ml/min (HCC)    B12 deficiency 06/27/2011   Peripheral vascular disease ([place]) 01/19/2007   Peptic ulcer disease- 2013 01/19/2007   Diabetes mellitus type 2, insulin dependent ([place]) 10/02/2006   Dyslipidemia 10/02/2006   Essential hypertension 10/02/2006   Coronary artery disease involving 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 ([place]) 10/02/2006   BENIGN PROSTATIC HYPERTROPHY 10/02/2006    Conditions to be addressed/monitored:CAD, HTN, and DMII  Care Plan : [facility] Care Plan  Updates made by Dimitri Ped, RN since 09/02/2021 12:00 AM  Completed 09/02/2021   Problem: Chronic Disease Managment Needs Related to HTN, CKD and Diabetes Resolved 09/02/2021  Priority: High  Onset Date: 06/30/2021     Long-Range Goal: Patient will Work with RN Care Manager Regarding Care Management and [place] with HTN, DM, and CKD Completed 09/02/2021  Start Date: 06/30/2021  Expected End Date: 07/01/2022  Recent Progress: On track  Priority: High  Note:   Case closure in Hospice care Current Barriers:  Chronic Disease Management support and education needs related to HTN, DMII, and CKD Stage 5, CAD States that a nurse was out to see him today.  States he is using his walker and has not had any falls.  States he tries to check his CBG every 2-3 days but he does not remember what his numbers were.  Denies any  recent low readings.  States he eats a good breakfast and not as much during the day.  States he is drinking an Ensure daily.  States he does get short of breath when walking but denies any chest pains or swelling.  States the nurse checked his B/P today and the top number was 124 but he does not remember the bottom number.  States his granddaughter usually checks on him daily Pt has Palliative care visits 76/6/23 pt states he is now getting hospice   RNCM Clinical Goal(s):  Patient will verbalize understanding of plan for management of CAD, HTN, DMII, and CKD Stage 5 as evidenced by voiced adherence to plan of care verbalize basic understanding of CAD, HTN, HLD, DMII, and CKD Stage 5 disease process and self health management plan as evidenced by self reported blood sugar readings within normal range and an A1C < 7 take all medications exactly as prescribed and will call provider for medication related questions as evidenced by dispense report and pt verbalization  attend all scheduled medical appointments: CCM LCSW 08/12/21, cardiology 10/27/21 as evidenced by medical records        demonstrate improved adherence to prescribed treatment plan for CAD, HTN, HLD, DMII, and CKD Stage 5 as evidenced by readings within limits, voiced adherence to plan of care continue to work with RN Care Manager and/or Social Worker to address care management and care coordination needs related to CAD, HTN, HLD, DMII, and CKD Stage 5 as evidenced by adherence to CM Team Scheduled appointments     through collaboration with PharmD, provider, and care team.   Interventions: 1:1 collaboration with primary care provider regarding development and update of comprehensive plan of care as evidenced by provider attestation and co-signature Inter-disciplinary care team collaboration (see longitudinal plan of care) Evaluation of current treatment plan related to  self management and patient's adherence to plan as established by  provider   CAD Interventions: (Status:   Case closure in Hospice care ) Long Term Goal Assessed understanding of CAD diagnosis Medications reviewed including medications utilized in CAD treatment plan Provided education on importance of blood pressure control in management of CAD Reviewed Importance of taking all medications as prescribed Reviewed Importance of attending all scheduled provider appointments Advised to report any changes in symptoms or exercise tolerance   Diabetes Interventions:  (Status:   Case closure in Hospice care ) Long Term Goal Assessed patient's understanding of A1c goal: <7% Provided education to patient about basic DM disease process Reviewed medications with patient and discussed importance of medication adherence Discussed plans with patient for ongoing care management follow up and provided patient with direct contact information for care management team Advised patient, providing education and rationale, to check cbg daily and record, calling provider for findings outside established parameters Referral made to pharmacy team for assistance with being followed by CCM PharmD Referral made to social work team for assistance with working with CCM LCSW to get assistance in home Lab Results  Component Value Date   HGBA1C 6.2 (A) 07/14/2021    Falls:  (Status:  Case closure in Hospice care ) Long Term Goal  Reviewed medications and discussed potential side effects of medications such as dizziness and frequent urination Advised patient of importance of notifying provider of falls Assessed for signs and symptoms of orthostatic hypotension Assessed for falls since last encounter Assessed social determinant of health barriers Fall assessment performed. Patient had one mechanical fall getting out of the shower within the past year. No injury. Reviewed fall precautions and to use walker at all times   Hypertension: (Status:  Case closure in Hospice care ) Long Term  Goal  Last practice recorded BP readings:  BP Readings from Last 3 Encounters:  07/14/21 (!) 110/58  07/08/21 130/60  06/16/21 (!) 112/58  Most recent eGFR/CrCl: No results found for: EGFR  No components found for: CRCL  Evaluation of current treatment plan related to hypertension self management and patient's adherence to plan as established by provider;   Reviewed prescribed diet low sodium low CHO Reviewed medications with patient and discussed importance of compliance;  Counseled on the importance of exercise goals with target of 150 minutes per week Discussed plans with patient for ongoing care management follow up and provided patient with direct contact information for care management team; Advised patient, providing education and rationale, to monitor blood pressure daily and record, calling PCP for findings outside established parameters;  Discussed complications of poorly controlled blood pressure such as heart disease, stroke, circulatory complications,  vision complications, kidney impairment, sexual dysfunction;  Reviewed to get up slowly and to drink adequate amounts of fluids   Patient Goals/Self-Care Activities: Take all medications as prescribed Attend all scheduled provider appointments Call pharmacy for medication refills 3-7 days in advance of running out of medications Perform all self care activities independently  Call provider office for new concerns or questions  check blood sugar at prescribed times: once daily and when you have symptoms of low or high blood sugar check feet daily for cuts, sores or redness enter blood sugar readings and medication or insulin into daily log fill half of plate with vegetables keep feet up while sitting check blood pressure daily choose a place to take my blood pressure (home, clinic or office, retail store) write blood pressure results in a log or diary take blood pressure log to all doctor appointments call doctor for signs  and symptoms of high blood pressure keep all doctor appointments  Follow Up Plan:  No further outreach :Case closed in Hospice care     Plan:The patient has been provided with contact information for the care management team and has been advised to call with any health related questions or concerns.  No further follow up required: Case closure in Hospice care [facility] RN, [facility], CDE Care Management Coordinator [facility] [phone]

## 2021-09-02 NOTE — Patient Instructions (Signed)
Visit Information Case closure in Hospice care Thank you for allowing me to share the care management and care coordination services that are available to you as part of your health plan and services through your primary care provider and medical home. Please reach out to me at [phone] if the care management/care coordination team may be of assistance to you in the future.   Peter Garter RN, Jackquline Denmark, CDE Care Management Coordinator  [facility] [phone]

## 2021-09-23 ENCOUNTER — Ambulatory Visit: Payer: Self-pay

## 2021-09-23 NOTE — Patient Outreach (Signed)
  Care Coordination   Initial Visit Note   09/23/2021 Name: EZEQUIAS LARD MRN: [id number] DOB: [birth year]  SHAUNAK KREIS is a 88 y.o. year old male who sees [name], [name], MD for primary care. I  spoke with patients son Abrham Maslowski by phone today.  What matters to the patients health and wellness today?  Patient doing well with [facility], no concerns at this time   Goals Addressed   None     SDOH assessments and interventions completed:   Yes SDOH Interventions Today    Flowsheet Row Most Recent Value  SDOH Interventions   Food Insecurity Interventions Intervention Not Indicated  Housing Interventions Intervention Not Indicated  Transportation Interventions Intervention Not Indicated       Care Coordination Interventions Activated:  No Care Coordination Interventions:  No, not indicated  Follow up plan: No further intervention required.  Encounter Outcome:  Pt. Visit Completed  Daneen Schick, BSW, CDP Social Worker, Certified Dementia Practitioner Care Coordination [phone]

## 2021-09-23 NOTE — Patient Instructions (Signed)
Visit Information  Thank you for taking time to visit with me today. Please don't hesitate to contact me if I can be of assistance to you.   Following are the goals we discussed today:   Goals Addressed   None     Please call the care guide team at [phone] if you need to schedule an appointment with me.  If you are experiencing a Mental Health or [place] or need someone to talk to, please go to [facility] [place] [phone])  The patient verbalized understanding of instructions, educational materials, and care plan provided today and DECLINED offer to receive copy of patient instructions, educational materials, and care plan.   No further follow up required: The patient is active with [employer] for [employer].   Daneen Schick, BSW, CDP Social Worker, Certified Dementia Practitioner Care Coordination [phone]

## 2021-10-27 ENCOUNTER — Ambulatory Visit: Payer: Medicare HMO | Admitting: Interventional Cardiology

## 2021-10-27 NOTE — Progress Notes (Signed)
Office Visit    Patient Name: [name] Date of Encounter: 10/27/2021  Primary Care Provider:  [name], [name], MD Primary Cardiologist:  [name], MD Primary Electrophysiologist: None  Chief Complaint    [name] is a 88 y.o. male with PMH of CAD s/p MI in 86s, PTCA of RCA in 1990s, CABG x5 on 06/2001 using internal mammary and saphenous, DM type II, HTN, CKD stage IV, chronic combined systolic and diastolic CHF who presents today for 4 to 15-monthfollow-up for coronary artery disease.  Past Medical History    Past Medical History:  Diagnosis Date   Arthritis    "touch in my fingers" (09/10/2014)   Atrophic gastritis    AVM (arteriovenous malformation) of duodenum, acquired    B12 deficiency anemia    "stopped taking the shots in ~ 06/2014" (09/10/2014)   Barrett's esophagus    CAD (coronary artery disease)    a. H/o MI in 1998;  b. s/p CABG 2003. c. 10/2014 NSTEMI/Cath: LM 75, LAD 1080m, D1 95, D2 50, LCX 100ost/p, OM1 80, OM2 80, RCA 100p/m, RPDA fills via L->L collats, LIMA->LAD ok, VG->dRCA 100, VG->OM1->OM2 99 prox to OM1 insertion->recanalated ->Tx with heparin,  VG->D1 100-->Med Rx.   Cataract    Chronic systolic CHF (congestive heart failure) ([facility]   a. EF 40-45% in 10/2014 (previously normal in 08/2014).   CKD (chronic kidney disease), stage IV (HCC)    Diabetes 1.5, managed as type 1 ([facility]   Hiatal hernia 2018   endoscopy -Dr. ArHavery Moros History of stomach ulcers 2013   Hyperlipidemia    Hypertension    Hypertensive heart disease    Ischemic cardiomyopathy    a. 10/2014 Echo: EF 40-45%.   [facility]   PVD (peripheral vascular disease) ([facility]   Type II diabetes mellitus ([facility]   Past Surgical History:  Procedure Laterality Date   CARDIAC CATHETERIZATION  2003;    CARDIAC CATHETERIZATION N/A 11/07/2014   Left Heart Cath; HeBelva CromeMD;    CATARACT EXTRACTION, BILATERAL Bilateral     CHOLECYSTECTOMY N/A 05/20/2015   Procedure: LAPAROSCOPIC CHOLECYSTECTOMY;  Surgeon: DoCoralie KeensMD;  [facility] Service: General;  Laterality: N/A;   COLONOSCOPY     COLONOSCOPY N/A 05/12/2015   Procedure: COLONOSCOPY;  Surgeon: DaMilus BanisterMD;  [facility] Service: Endoscopy;  Laterality: N/A;   CORONARY ANGIOPLASTY  1998   /notes 07/13/2010   CORONARY ARTERY BYPASS GRAFT  2003   CABG X5   ESOPHAGOGASTRODUODENOSCOPY  06/10/2011   Procedure: ESOPHAGOGASTRODUODENOSCOPY (EGD);  Surgeon: MaLadene ArtistMD,FACG;  [facility];  Service: Endoscopy;  Laterality: N/A;   ESOPHAGOGASTRODUODENOSCOPY N/A 10/04/2016   Procedure: ESOPHAGOGASTRODUODENOSCOPY (EGD);  Surgeon: ArManus GunningMD;  [facility];  Service: Gastroenterology;  Laterality: N/A;    Allergies  No Known Allergies  History of Present Illness    FrYAIR DUSZAs a [age]ear old male with above-mentioned past medical history who presents today for 4-27-monthllow-up of coronary artery disease.  He was initially seen in 08/2014 by Dr. McaAngelena Formth complaint of chest pain.  Nuclear stress test was high risk, however cardiac cath was not performed as patient refused to go on dialysis at the time if needed. Patient has an extensive coronary history that dates back to the 1990s with MI.  He underwent bypass x5 in 2003.  Troponins were flat and  patient had Imdur added for chest pain.  He presented again in 10/2014 with chest pain and troponins elevated with acute MI and inferior Q waves suggestive of ST elevation.  Patient underwent left heart cath that showed widely patent left internal mammary graft, with total occlusion of SVG to RCA with thrombotic high-grade stenosis.  Consideration was made for staged PCI but due to patient's chronic kidney disease it was deferred at that time.  He continued to have subsequent hospitalizations for CHF exacerbations.  Adjustments were made to  diuretics.  He was admitted in 2017 with GI bleed due to being on Plavix and aspirin.  He was also suffering CHF exacerbation.   [name] was most recently seen on 05/2021 by Dr. Tamala Julian for follow-up.  During visit patient was complaining of increased fatigue, dyspnea on exertion and decreased appetite.  2D echo was most recently completed 05/2021 that showed EF of 25-30% with severely decreased LV function.  During the visit Dr. Tamala Julian was able to contact patient and son for conversation regarding palliative care consultation.  Following their conversation patient was in agreement to consult palliative care.  [name] presents today for 74-monthfollow-up.  Mr. RAmickpresents today for follow-up with his son. Since last being seen in the office patient reports that he has experienced any additional chest pain and fatigue and shortness of breath have remained the same.  He was able to discuss treatment plan with palliative care and is currently under hospice care.  We discussed the need for follow-up appointments and patient son agree with plan as as needed in the future.  His blood pressure today was 110/60 and heart rate was 50.  Patient denies chest pain, palpitations, PND, orthopnea, nausea, vomiting, dizziness, syncope, edema, weight gain, or early satiety.   Home Medications    Current Outpatient Medications  Medication Sig Dispense Refill   Accu-Chek Softclix Lancets lancets TEST BLOOD SUGAR EVERY DAY 100 each 12   acetaminophen (TYLENOL) 500 MG tablet Take 500 mg by mouth every 8 (eight) hours as needed for mild pain or headache.     Alcohol Swabs (ALCOHOL PREP) PADS USE TO TEST BLOOD GLUCOSE THREE TIMES DAILY 300 each 3   amLODipine (NORVASC) 10 MG tablet Take 1 tablet (10 mg total) by mouth daily. 90 tablet 1   aspirin EC 81 MG tablet Take 1 tablet (81 mg total) by mouth daily. Swallow whole. 90 tablet 1   atorvastatin (LIPITOR) 80 MG tablet TAKE 1 TABLET EVERY DAY  AT  6PM 90 tablet  3   BD PEN NEEDLE NANO U/F 32G X 4 MM MISC INJECT TWO TIMES DAILY AS NEEDED. 180 each 3   bisacodyl (DULCOLAX) 5 MG EC tablet Take 5 mg by mouth daily as needed for moderate constipation.     Blood Glucose Monitoring Suppl (ACCU-CHEK AVIVA PLUS) w/Device KIT USE AS DIRECTED 1 kit 0   brimonidine (ALPHAGAN) 0.2 % ophthalmic solution Place 1 drop into both eyes 2 (two) times daily.     carvedilol (COREG) 12.5 MG tablet TAKE 1 TABLET TWICE DAILY 180 tablet 3   cloNIDine (CATAPRES) 0.1 MG tablet TAKE 1 TABLET AT BEDTIME 90 tablet 1   COLCRYS 0.6 MG tablet TAKE 1 TABLET EVERY DAY 90 tablet 1   dorzolamide-timolol (COSOPT) 22.3-6.8 MG/ML ophthalmic solution Place 1 drop into both eyes 2 (two) times daily.     furosemide (LASIX) 80 MG tablet Take 1 tablet (80 mg total) by mouth daily. 90 tablet 3  glucose blood (ACCU-CHEK GUIDE) test strip TEST BLOOD SUGAR EVERY DAY 100 strip 1   hydrALAZINE (APRESOLINE) 25 MG tablet TAKE 1 TABLET (25 MG TOTAL) BY MOUTH 3 (THREE) TIMES DAILY. 270 tablet 1   insulin glargine (LANTUS SOLOSTAR) 100 UNIT/ML Solostar Pen Inject 10-12 Units into the skin daily.     isosorbide mononitrate (IMDUR) 60 MG 24 hr tablet Take 1 tablet (60 mg total) by mouth in the morning and at bedtime. 180 tablet 3   Lancet Devices (ACCU-CHEK SOFTCLIX) lancets USE TO TEST BLOOD GLUCOSE THREE TIMES DAILY 1 each 0   latanoprost (XALATAN) 0.005 % ophthalmic solution Place 1 drop into both eyes at bedtime.      nitroGLYCERIN (NITROSTAT) 0.4 MG SL tablet Place 1 tablet (0.4 mg total) under the tongue every 5 (five) minutes as needed for chest pain (3 doses MAX). 75 tablet 1   omeprazole (PRILOSEC) 20 MG capsule Take 1 capsule (20 mg total) by mouth daily. **PLEASE CONTACT OFFICE TO SCHEDULE AN APPOINTMENT 90 capsule 0   OVER THE COUNTER MEDICATION Take 1 tablet by mouth daily. Prostate formula     pentoxifylline (TRENTAL) 400 MG CR tablet TAKE 1 TABLET TWICE DAILY AT 10AM AND 5PM 180 tablet 1    potassium chloride SA (KLOR-CON M) 20 MEQ tablet Take 1 tablet (20 mEq total) by mouth daily. 90 tablet 1   timolol (TIMOPTIC) 0.5 % ophthalmic solution Place 1 drop into both eyes in the morning and at bedtime.     No current facility-administered medications for this visit.     Review of Systems  Please see the history of present illness.    (+) Fatigue (+) Chronic shortness of breath  All other systems reviewed and are otherwise negative except as noted above.  Physical Exam    Wt Readings from Last 3 Encounters:  07/14/21 138 lb 3.2 oz (62.7 kg)  07/08/21 137 lb 3.2 oz (62.2 kg)  06/16/21 144 lb 12.8 oz (65.7 kg)   YF:VCBSW were no vitals filed for this visit.,There is no height or weight on file to calculate BMI.  Constitutional:      Appearance: Elderly appearance. Not in distress.  Neck:     Vascular: JVD normal.  Pulmonary:     Effort: Pulmonary effort is normal.     Breath sounds: No wheezing. No rales. Diminished in the bases Cardiovascular:     Sinus bradycardia regular rhythm. Normal S1. Normal S2.      Murmurs: There is no murmur.  Edema:    Peripheral edema absent.  Abdominal:     Palpations: Abdomen is soft non tender. There is no hepatomegaly.  Skin:    General: Skin is warm and dry.  Neurological:     General: No focal deficit present.     Mental Status: Alert and oriented to person, place and time.     Cranial Nerves: Cranial nerves are intact.  EKG/LABS/Other Studies Reviewed    ECG personally reviewed by me today -none completed today  Lab Results  Component Value Date   WBC 5.7 05/01/2020   HGB 9.3 (A) 06/30/2021   HCT 27.6 (L) 05/01/2020   MCV 97.2 05/01/2020   PLT 141.0 (L) 05/01/2020   Lab Results  Component Value Date   CREATININE 4.16 (H) 04/15/2021   BUN 75 (A) 06/30/2021   NA 138 04/15/2021   K 4.3 04/15/2021   CL 109 04/15/2021   CO2 14 06/30/2021   Lab Results  Component Value Date  ALT 12 04/15/2021   AST 14 04/15/2021    ALKPHOS 65 04/15/2021   BILITOT 0.4 04/15/2021   Lab Results  Component Value Date   CHOL 120 11/21/2018   HDL 23 (L) 11/21/2018   LDLCALC 61 11/21/2018   LDLDIRECT 93.3 01/22/2013   TRIG 215 (H) 11/21/2018   CHOLHDL 5.2 (H) 11/21/2018    Lab Results  Component Value Date   HGBA1C 6.2 (A) 07/14/2021    Assessment & Plan    1.  Coronary artery disease: -Significant cardiac history with no targets for bypass and 1 conduit working currently -Continue GDMT with Lipitor 80 mg daily, ASA 81 mg daily carvedilol 12.5 mg twice daily, Imdur 60 mg daily -Patient reports today that he has not experienced any additional chest pain or discomfort -He is currently under hospice care and will follow-up as needed  2.  Combined systolic and diastolic CHF: -Last 2D echo completed with EF of 25-30% with severely decreased LV function. -Patient had progressive shortness of breath with dyspnea on exertion during previous appointment.   -Today patient is euvolemic on examination -Continue current treatment plan as noted above  3.  Hypertension: -Blood pressure today was well controlled at 110/60 -Continue hydralazine 25 mg 3 times daily, clonidine 0.1 mg, Norvasc 10 mg daily  4.  DM type II: -Continue current regimen per PCP  5.  CKD stage IV: -Patient has refused dialysis currently followed by nephrology  6.  Hyperlipidemia: -Currently followed by PCP  Disposition: Patient instructed to follow-up as needed    Medication Adjustments/Labs and Tests Ordered: Current medicines are reviewed at length with the patient today.  Concerns regarding medicines are outlined above.   Signed, Mable Fill, Marissa Nestle, NP 10/27/2021, 8:42 AM [place]

## 2021-10-29 ENCOUNTER — Ambulatory Visit: Payer: Medicare HMO | Attending: Interventional Cardiology | Admitting: Nurse Practitioner

## 2021-10-29 ENCOUNTER — Encounter: Payer: Self-pay | Admitting: Nurse Practitioner

## 2021-10-29 VITALS — BP 110/60 | HR 50 | Ht 70.0 in | Wt 140.4 lb

## 2021-10-29 DIAGNOSIS — I25709 Atherosclerosis of coronary artery bypass graft(s), unspecified, with unspecified angina pectoris: Secondary | ICD-10-CM

## 2021-10-29 DIAGNOSIS — I5042 Chronic combined systolic (congestive) and diastolic (congestive) heart failure: Secondary | ICD-10-CM

## 2021-10-29 DIAGNOSIS — I10 Essential (primary) hypertension: Secondary | ICD-10-CM

## 2021-10-29 DIAGNOSIS — E0821 Diabetes mellitus due to underlying condition with diabetic nephropathy: Secondary | ICD-10-CM

## 2021-10-29 DIAGNOSIS — N184 Chronic kidney disease, stage 4 (severe): Secondary | ICD-10-CM | POA: Diagnosis not present

## 2021-10-29 DIAGNOSIS — Z794 Long term (current) use of insulin: Secondary | ICD-10-CM

## 2021-10-29 DIAGNOSIS — E785 Hyperlipidemia, unspecified: Secondary | ICD-10-CM

## 2021-10-29 NOTE — Patient Instructions (Signed)
Medication Instructions:  Your physician recommends that you continue on your current medications as directed. Please refer to the Current Medication list given to you today.   *If you need a refill on your cardiac medications before your next appointment, please call your pharmacy*  Follow-Up: At [facility], you and your health needs are our priority.  As part of our continuing mission to provide you with exceptional heart care, we have created designated Provider Care Teams.  These Care Teams include your primary Cardiologist (physician) and Advanced Practice Providers (APPs -  Physician Assistants and Nurse Practitioners) who all work together to provide you with the care you need, when you need it.  Your next appointment:   As needed 

## 2021-11-25 ENCOUNTER — Other Ambulatory Visit: Payer: Self-pay | Admitting: Internal Medicine

## 2022-01-28 DEATH — deceased

## 2022-06-28 IMAGING — DX DG CHEST 2V
2 series · 2 of 2 positions shown · non-contrast
Comparison: Chest x-ray 08/11/2018.

CLINICAL DATA: 89-year-old male with shortness of breath and edema.
Suspected congestive heart failure exacerbation.

EXAM:
CHEST - 2 VIEW

[chest pa]
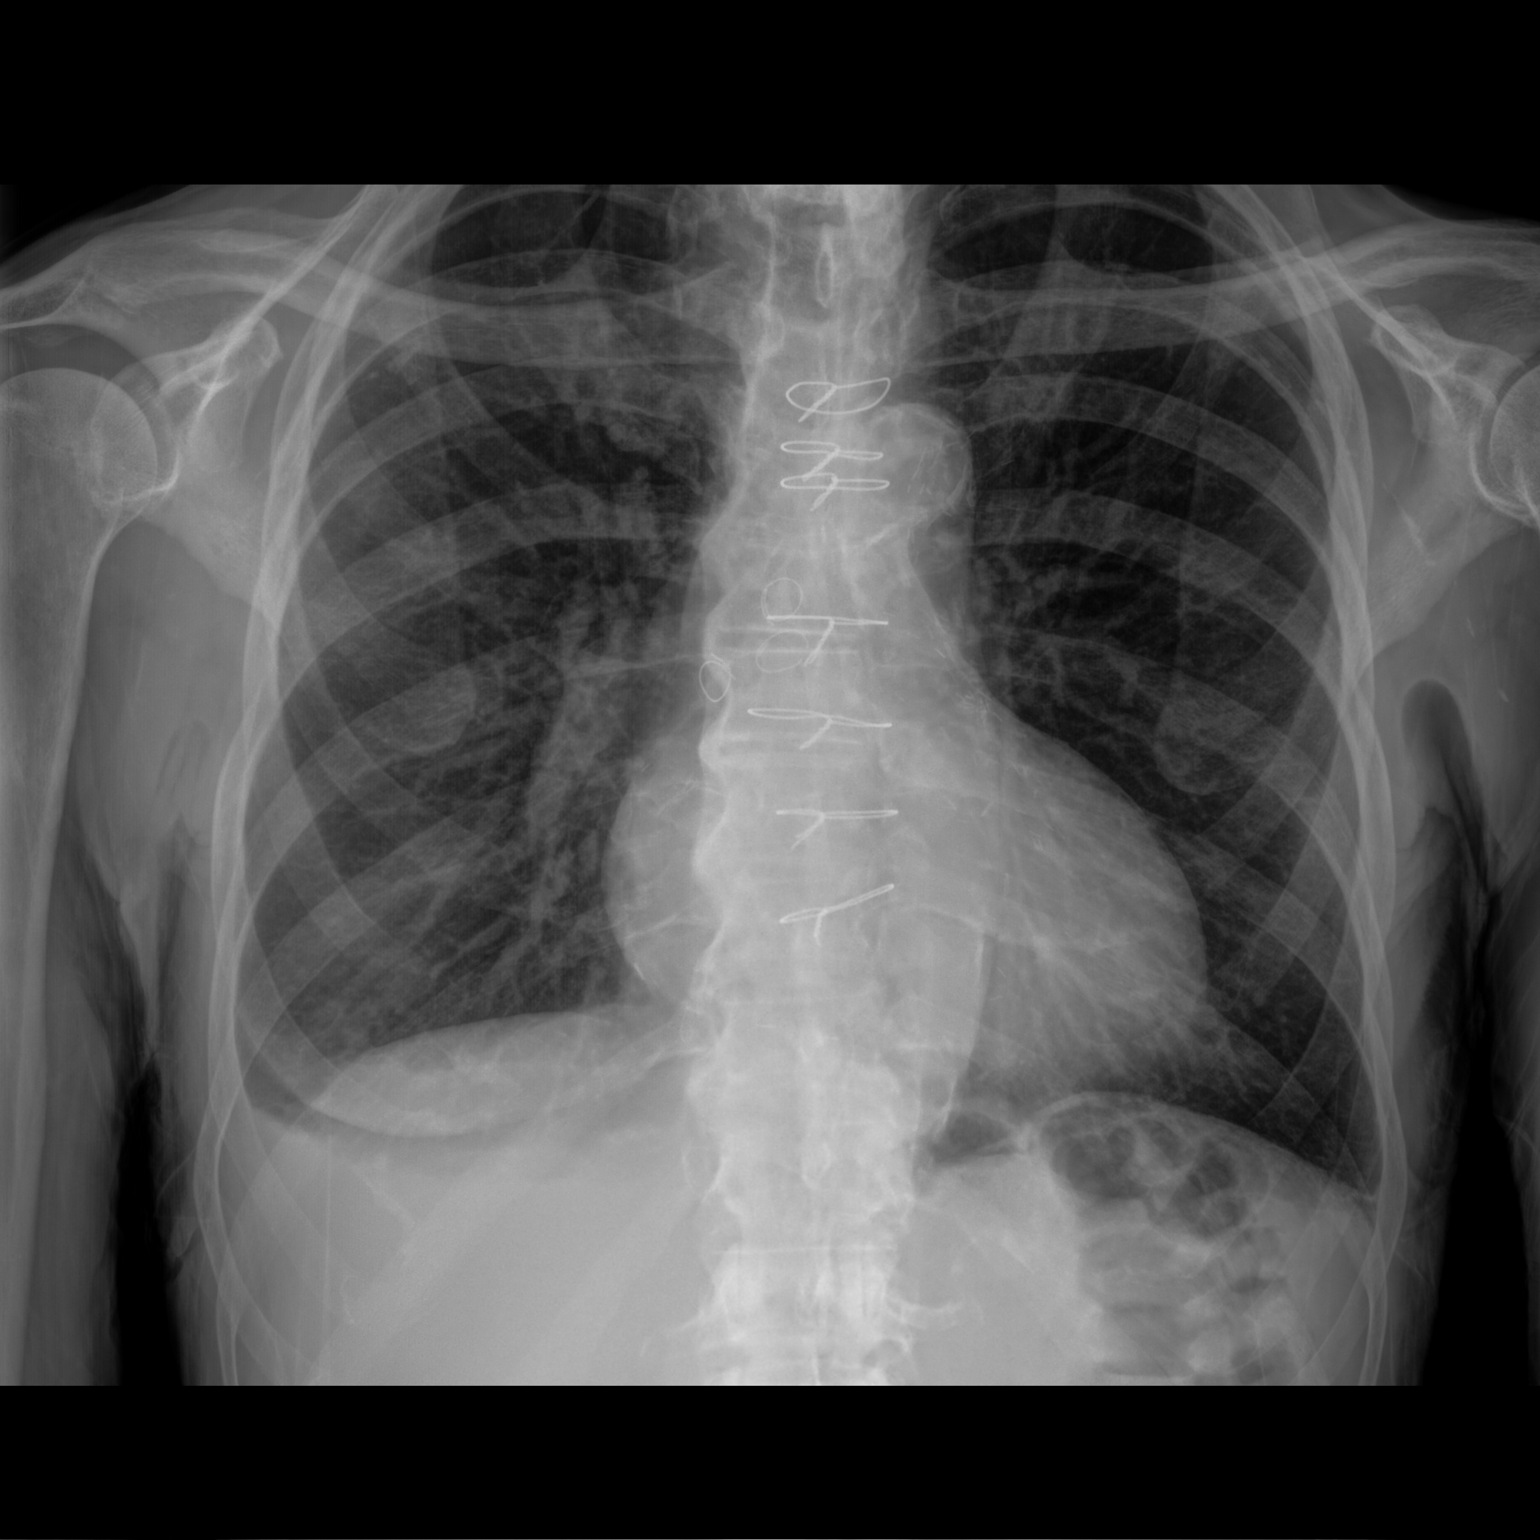

[chest lat]
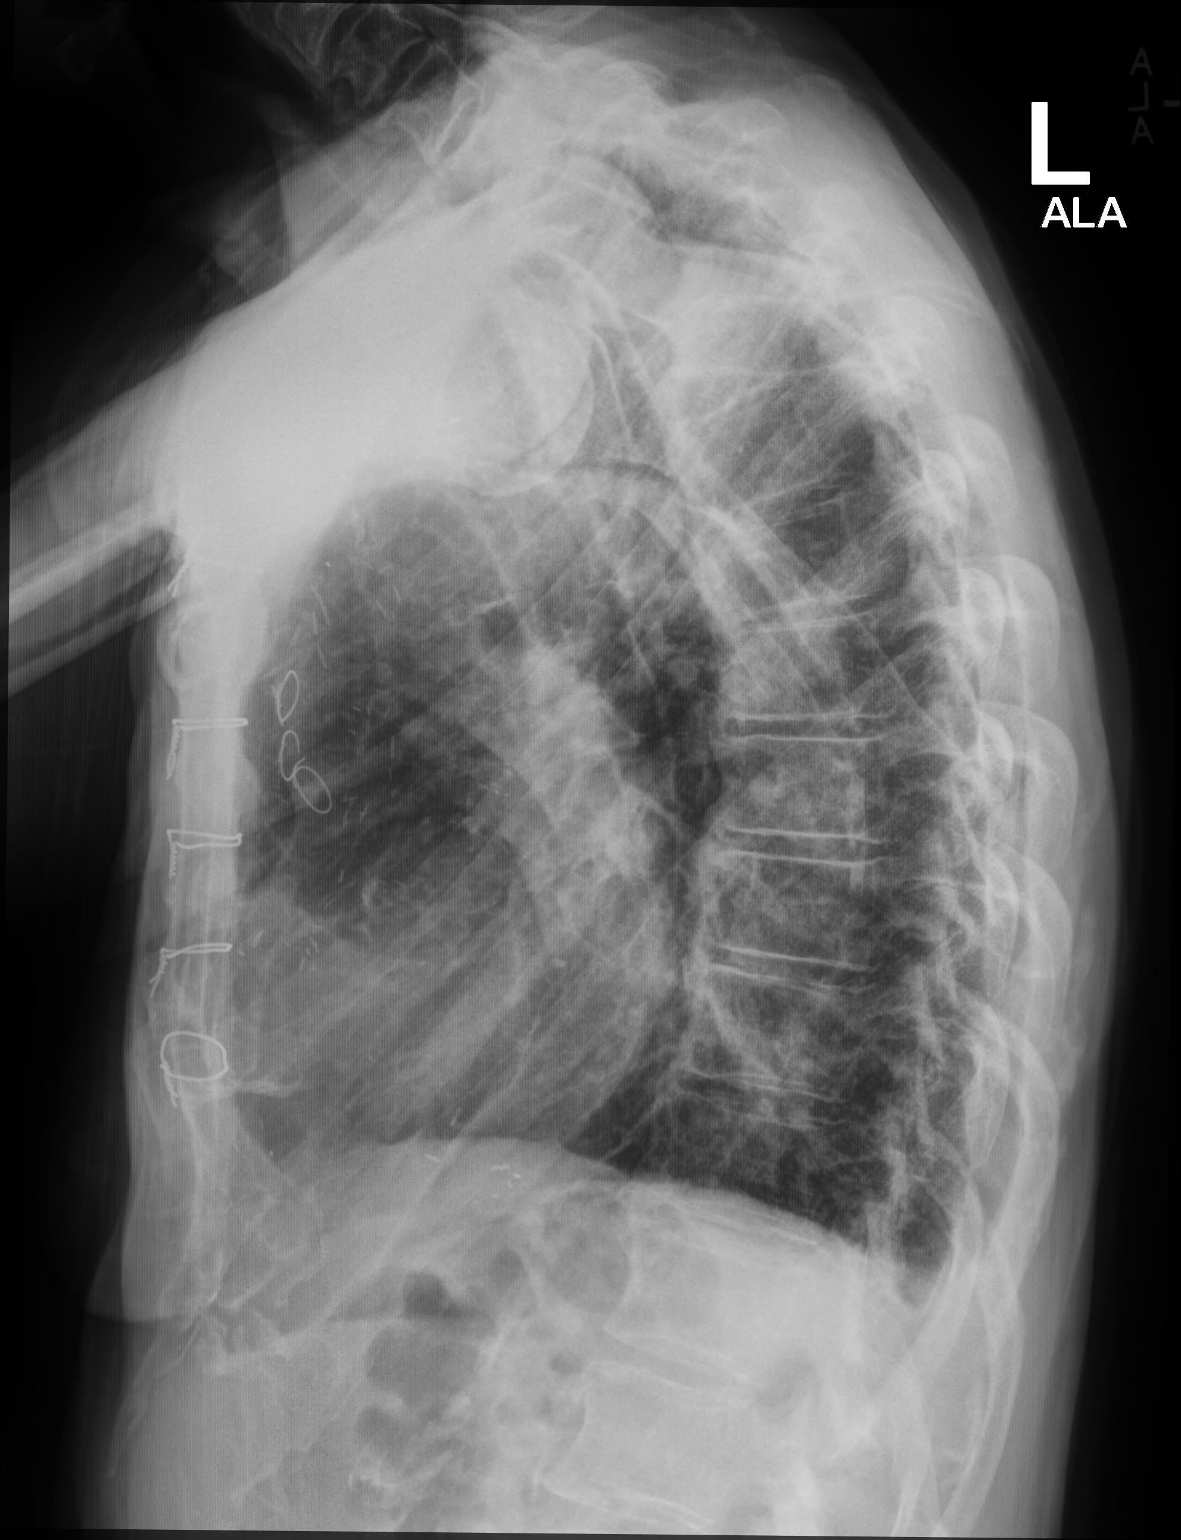

[2 of 2 positions shown; findings below may reference images not displayed]

FINDINGS: Lung volumes are normal. No consolidative airspace disease. No
pleural effusions. No pneumothorax. No pulmonary nodule or mass
noted. Pulmonary vasculature and the cardiomediastinal silhouette
are within normal limits. Atherosclerosis in the thoracic aorta.
Status post median sternotomy for CABG.
IMPRESSION: 1.  No radiographic evidence of acute cardiopulmonary disease.
2. Aortic atherosclerosis.
